# Patient Record
Sex: Male | Born: 1940 | Race: White | Hispanic: No | Marital: Married | Smoking: Never smoker
Health system: Southern US, Community
[De-identification: ages and names within clinical notes are randomized; demographics above are authoritative.]

## PROBLEM LIST (undated history)

## (undated) DIAGNOSIS — I252 Old myocardial infarction: Secondary | ICD-10-CM

## (undated) DIAGNOSIS — I255 Ischemic cardiomyopathy: Secondary | ICD-10-CM

## (undated) DIAGNOSIS — Z95810 Presence of automatic (implantable) cardiac defibrillator: Secondary | ICD-10-CM

## (undated) DIAGNOSIS — K219 Gastro-esophageal reflux disease without esophagitis: Secondary | ICD-10-CM

## (undated) DIAGNOSIS — A048 Other specified bacterial intestinal infections: Secondary | ICD-10-CM

## (undated) DIAGNOSIS — I2510 Atherosclerotic heart disease of native coronary artery without angina pectoris: Secondary | ICD-10-CM

## (undated) DIAGNOSIS — I5023 Acute on chronic systolic (congestive) heart failure: Secondary | ICD-10-CM

## (undated) DIAGNOSIS — I480 Paroxysmal atrial fibrillation: Secondary | ICD-10-CM

## (undated) DIAGNOSIS — I4729 Other ventricular tachycardia: Secondary | ICD-10-CM

## (undated) DIAGNOSIS — I472 Ventricular tachycardia: Secondary | ICD-10-CM

## (undated) HISTORY — DX: Gastro-esophageal reflux disease without esophagitis: K21.9

## (undated) HISTORY — PX: ICD GENERATOR CHANGE: SHX5854

## (undated) HISTORY — DX: Other specified bacterial intestinal infections: A04.8

## (undated) HISTORY — DX: Ventricular tachycardia: I47.2

## (undated) HISTORY — PX: HERNIA REPAIR: SHX51

## (undated) HISTORY — DX: Atherosclerotic heart disease of native coronary artery without angina pectoris: I25.10

## (undated) HISTORY — DX: Paroxysmal atrial fibrillation: I48.0

## (undated) HISTORY — PX: UPPER GI ENDOSCOPY: SHX6162

## (undated) HISTORY — DX: Ischemic cardiomyopathy: I25.5

## (undated) HISTORY — DX: Old myocardial infarction: I25.2

## (undated) HISTORY — DX: Other ventricular tachycardia: I47.29

## (undated) HISTORY — PX: KNEE SURGERY: SHX244

## (undated) HISTORY — DX: Presence of automatic (implantable) cardiac defibrillator: Z95.810

## (undated) HISTORY — PX: CATARACT EXTRACTION, BILATERAL: SHX1313

## (undated) SURGERY — CARDIOVERSION
Anesthesia: General

---

## 1990-01-29 DIAGNOSIS — I252 Old myocardial infarction: Secondary | ICD-10-CM

## 1990-01-29 HISTORY — DX: Old myocardial infarction: I25.2

## 1994-11-05 ENCOUNTER — Encounter: Payer: Self-pay | Admitting: Gastroenterology

## 1996-01-30 HISTORY — PX: CORONARY ARTERY BYPASS GRAFT: SHX141

## 1997-04-30 ENCOUNTER — Encounter (HOSPITAL_COMMUNITY): Admission: RE | Admit: 1997-04-30 | Discharge: 1997-07-29 | Payer: Self-pay | Admitting: Cardiology

## 1997-08-28 ENCOUNTER — Emergency Department (HOSPITAL_COMMUNITY): Admission: EM | Admit: 1997-08-28 | Discharge: 1997-08-28 | Payer: Self-pay | Admitting: Emergency Medicine

## 1998-02-01 ENCOUNTER — Encounter: Payer: Self-pay | Admitting: Emergency Medicine

## 1998-02-01 ENCOUNTER — Emergency Department (HOSPITAL_COMMUNITY): Admission: EM | Admit: 1998-02-01 | Discharge: 1998-02-01 | Payer: Self-pay | Admitting: Emergency Medicine

## 1998-03-24 ENCOUNTER — Encounter (HOSPITAL_COMMUNITY): Admission: RE | Admit: 1998-03-24 | Discharge: 1998-06-22 | Payer: Self-pay | Admitting: Cardiovascular Disease

## 1999-07-05 ENCOUNTER — Encounter: Payer: Self-pay | Admitting: Orthopedic Surgery

## 1999-07-05 ENCOUNTER — Encounter: Admission: RE | Admit: 1999-07-05 | Discharge: 1999-07-05 | Payer: Self-pay | Admitting: Orthopedic Surgery

## 1999-07-06 ENCOUNTER — Encounter: Admission: RE | Admit: 1999-07-06 | Discharge: 1999-07-06 | Payer: Self-pay | Admitting: Orthopedic Surgery

## 1999-07-06 ENCOUNTER — Encounter: Payer: Self-pay | Admitting: Orthopedic Surgery

## 1999-09-04 ENCOUNTER — Encounter: Payer: Self-pay | Admitting: Internal Medicine

## 1999-09-04 ENCOUNTER — Ambulatory Visit (HOSPITAL_COMMUNITY): Admission: RE | Admit: 1999-09-04 | Discharge: 1999-09-05 | Payer: Self-pay | Admitting: Internal Medicine

## 1999-09-05 ENCOUNTER — Encounter: Payer: Self-pay | Admitting: Internal Medicine

## 2000-11-14 ENCOUNTER — Ambulatory Visit (HOSPITAL_COMMUNITY): Admission: RE | Admit: 2000-11-14 | Discharge: 2000-11-14 | Payer: Self-pay | Admitting: Gastroenterology

## 2000-11-14 ENCOUNTER — Encounter: Payer: Self-pay | Admitting: Gastroenterology

## 2002-10-02 ENCOUNTER — Encounter: Admission: RE | Admit: 2002-10-02 | Discharge: 2002-10-02 | Payer: Self-pay | Admitting: Gastroenterology

## 2002-10-02 ENCOUNTER — Encounter: Payer: Self-pay | Admitting: Gastroenterology

## 2004-05-09 ENCOUNTER — Ambulatory Visit: Payer: Self-pay | Admitting: Gastroenterology

## 2004-05-17 ENCOUNTER — Ambulatory Visit: Payer: Self-pay | Admitting: Gastroenterology

## 2004-05-22 ENCOUNTER — Ambulatory Visit: Payer: Self-pay | Admitting: Gastroenterology

## 2004-10-09 ENCOUNTER — Ambulatory Visit: Payer: Self-pay | Admitting: Internal Medicine

## 2004-10-25 ENCOUNTER — Ambulatory Visit: Payer: Self-pay | Admitting: Cardiology

## 2004-11-17 ENCOUNTER — Ambulatory Visit: Payer: Self-pay | Admitting: Cardiovascular Disease

## 2004-12-15 ENCOUNTER — Ambulatory Visit: Payer: Self-pay | Admitting: Cardiology

## 2005-01-12 ENCOUNTER — Ambulatory Visit: Payer: Self-pay | Admitting: Cardiology

## 2005-02-08 ENCOUNTER — Ambulatory Visit: Payer: Self-pay | Admitting: Gastroenterology

## 2005-02-09 ENCOUNTER — Ambulatory Visit: Payer: Self-pay | Admitting: Internal Medicine

## 2005-03-14 ENCOUNTER — Ambulatory Visit: Payer: Self-pay | Admitting: Internal Medicine

## 2005-03-14 ENCOUNTER — Ambulatory Visit: Payer: Self-pay

## 2005-04-13 ENCOUNTER — Ambulatory Visit: Payer: Self-pay | Admitting: Cardiovascular Disease

## 2005-05-14 ENCOUNTER — Ambulatory Visit: Payer: Self-pay | Admitting: Internal Medicine

## 2005-06-11 ENCOUNTER — Ambulatory Visit: Payer: Self-pay | Admitting: Cardiology

## 2005-07-10 ENCOUNTER — Ambulatory Visit: Payer: Self-pay | Admitting: Internal Medicine

## 2005-07-10 ENCOUNTER — Ambulatory Visit: Payer: Self-pay | Admitting: *Deleted

## 2005-07-26 ENCOUNTER — Ambulatory Visit: Payer: Self-pay | Admitting: Internal Medicine

## 2005-08-20 ENCOUNTER — Ambulatory Visit: Payer: Self-pay | Admitting: Cardiology

## 2005-09-14 ENCOUNTER — Ambulatory Visit: Payer: Self-pay | Admitting: Internal Medicine

## 2005-09-27 ENCOUNTER — Ambulatory Visit: Payer: Self-pay | Admitting: Cardiology

## 2005-10-09 ENCOUNTER — Ambulatory Visit: Payer: Self-pay | Admitting: Internal Medicine

## 2005-10-18 ENCOUNTER — Ambulatory Visit: Payer: Self-pay | Admitting: Cardiology

## 2005-11-05 ENCOUNTER — Ambulatory Visit: Payer: Self-pay | Admitting: Cardiovascular Disease

## 2005-11-29 ENCOUNTER — Ambulatory Visit: Payer: Self-pay | Admitting: *Deleted

## 2005-12-13 ENCOUNTER — Ambulatory Visit: Payer: Self-pay | Admitting: Cardiology

## 2006-01-09 ENCOUNTER — Ambulatory Visit: Payer: Self-pay | Admitting: *Deleted

## 2006-01-18 ENCOUNTER — Ambulatory Visit (HOSPITAL_COMMUNITY): Admission: RE | Admit: 2006-01-18 | Discharge: 2006-01-18 | Payer: Self-pay | Admitting: Family Medicine

## 2006-01-24 ENCOUNTER — Ambulatory Visit: Payer: Self-pay | Admitting: Internal Medicine

## 2006-01-24 ENCOUNTER — Ambulatory Visit: Payer: Self-pay | Admitting: Cardiology

## 2006-01-31 ENCOUNTER — Ambulatory Visit: Payer: Self-pay | Admitting: Cardiology

## 2006-02-07 ENCOUNTER — Ambulatory Visit: Payer: Self-pay | Admitting: Cardiology

## 2006-02-21 ENCOUNTER — Ambulatory Visit: Payer: Self-pay | Admitting: Internal Medicine

## 2006-02-28 ENCOUNTER — Ambulatory Visit: Payer: Self-pay | Admitting: Cardiology

## 2006-03-08 ENCOUNTER — Ambulatory Visit: Payer: Self-pay | Admitting: *Deleted

## 2006-03-18 ENCOUNTER — Ambulatory Visit: Payer: Self-pay | Admitting: Internal Medicine

## 2006-03-18 LAB — CONVERTED CEMR LAB: INR: 6.2 (ref 0.9–2.0)

## 2006-04-03 ENCOUNTER — Ambulatory Visit: Payer: Self-pay | Admitting: *Deleted

## 2006-04-11 ENCOUNTER — Ambulatory Visit: Payer: Self-pay | Admitting: Cardiology

## 2006-04-24 ENCOUNTER — Ambulatory Visit: Payer: Self-pay | Admitting: Cardiovascular Disease

## 2006-05-15 ENCOUNTER — Ambulatory Visit: Payer: Self-pay | Admitting: Internal Medicine

## 2006-05-15 LAB — CONVERTED CEMR LAB: Prothrombin Time: 28.2 s — ABNORMAL HIGH (ref 10.0–14.0)

## 2006-05-23 ENCOUNTER — Ambulatory Visit: Payer: Self-pay | Admitting: Internal Medicine

## 2006-05-27 ENCOUNTER — Ambulatory Visit: Payer: Self-pay | Admitting: Cardiology

## 2006-07-02 ENCOUNTER — Ambulatory Visit: Payer: Self-pay | Admitting: Cardiology

## 2006-07-17 ENCOUNTER — Ambulatory Visit: Payer: Self-pay | Admitting: Cardiology

## 2006-08-07 ENCOUNTER — Ambulatory Visit: Payer: Self-pay | Admitting: Cardiology

## 2006-08-16 ENCOUNTER — Ambulatory Visit: Payer: Self-pay | Admitting: Gastroenterology

## 2006-08-23 ENCOUNTER — Ambulatory Visit: Payer: Self-pay | Admitting: Cardiology

## 2006-08-23 LAB — CONVERTED CEMR LAB
INR: 8.2 (ref 0.9–2.0)
Prothrombin Time: 37.7 s (ref 10.0–14.0)

## 2006-08-26 ENCOUNTER — Ambulatory Visit: Payer: Self-pay | Admitting: Internal Medicine

## 2006-08-26 ENCOUNTER — Ambulatory Visit: Payer: Self-pay | Admitting: Gastroenterology

## 2006-08-26 LAB — CONVERTED CEMR LAB: OCCULT 3: NEGATIVE

## 2006-09-02 ENCOUNTER — Ambulatory Visit: Payer: Self-pay | Admitting: Internal Medicine

## 2006-09-12 ENCOUNTER — Ambulatory Visit: Payer: Self-pay | Admitting: Cardiovascular Disease

## 2006-10-03 ENCOUNTER — Ambulatory Visit: Payer: Self-pay | Admitting: Cardiology

## 2006-10-16 ENCOUNTER — Ambulatory Visit: Payer: Self-pay | Admitting: Internal Medicine

## 2006-10-24 ENCOUNTER — Ambulatory Visit: Payer: Self-pay | Admitting: Internal Medicine

## 2006-10-30 ENCOUNTER — Ambulatory Visit: Payer: Self-pay | Admitting: Cardiology

## 2006-11-13 ENCOUNTER — Ambulatory Visit: Payer: Self-pay | Admitting: Cardiovascular Disease

## 2006-12-11 ENCOUNTER — Ambulatory Visit: Payer: Self-pay | Admitting: Cardiology

## 2007-01-09 ENCOUNTER — Ambulatory Visit: Payer: Self-pay | Admitting: Cardiology

## 2007-01-21 ENCOUNTER — Ambulatory Visit: Payer: Self-pay | Admitting: Cardiology

## 2007-01-29 ENCOUNTER — Ambulatory Visit: Payer: Self-pay | Admitting: Cardiology

## 2007-02-05 ENCOUNTER — Ambulatory Visit: Payer: Self-pay | Admitting: Internal Medicine

## 2007-04-19 ENCOUNTER — Emergency Department (HOSPITAL_COMMUNITY): Admission: EM | Admit: 2007-04-19 | Discharge: 2007-04-19 | Payer: Self-pay | Admitting: Emergency Medicine

## 2007-04-27 ENCOUNTER — Inpatient Hospital Stay (HOSPITAL_COMMUNITY): Admission: EM | Admit: 2007-04-27 | Discharge: 2007-05-05 | Payer: Self-pay | Admitting: Emergency Medicine

## 2007-04-27 ENCOUNTER — Ambulatory Visit: Payer: Self-pay | Admitting: Internal Medicine

## 2007-05-08 ENCOUNTER — Ambulatory Visit: Payer: Self-pay | Admitting: Internal Medicine

## 2007-05-13 DIAGNOSIS — I255 Ischemic cardiomyopathy: Secondary | ICD-10-CM | POA: Insufficient documentation

## 2007-05-13 DIAGNOSIS — I480 Paroxysmal atrial fibrillation: Secondary | ICD-10-CM

## 2007-05-13 DIAGNOSIS — R1013 Epigastric pain: Secondary | ICD-10-CM | POA: Insufficient documentation

## 2007-06-10 ENCOUNTER — Telehealth: Payer: Self-pay | Admitting: Gastroenterology

## 2007-08-07 ENCOUNTER — Ambulatory Visit: Payer: Self-pay | Admitting: Internal Medicine

## 2007-08-19 ENCOUNTER — Telehealth: Payer: Self-pay | Admitting: Gastroenterology

## 2007-10-09 DIAGNOSIS — K589 Irritable bowel syndrome without diarrhea: Secondary | ICD-10-CM

## 2007-10-09 DIAGNOSIS — E785 Hyperlipidemia, unspecified: Secondary | ICD-10-CM

## 2007-10-09 DIAGNOSIS — N200 Calculus of kidney: Secondary | ICD-10-CM | POA: Insufficient documentation

## 2007-10-09 DIAGNOSIS — J45909 Unspecified asthma, uncomplicated: Secondary | ICD-10-CM | POA: Insufficient documentation

## 2007-10-10 ENCOUNTER — Ambulatory Visit: Payer: Self-pay | Admitting: Gastroenterology

## 2007-10-10 DIAGNOSIS — R1032 Left lower quadrant pain: Secondary | ICD-10-CM | POA: Insufficient documentation

## 2007-10-10 DIAGNOSIS — K5732 Diverticulitis of large intestine without perforation or abscess without bleeding: Secondary | ICD-10-CM | POA: Insufficient documentation

## 2007-10-13 LAB — CONVERTED CEMR LAB
Basophils Absolute: 0 10*3/uL (ref 0.0–0.1)
Eosinophils Absolute: 0.5 10*3/uL (ref 0.0–0.7)
Lymphocytes Relative: 14.2 % (ref 12.0–46.0)
MCV: 88 fL (ref 78.0–100.0)
Monocytes Absolute: 1.2 10*3/uL — ABNORMAL HIGH (ref 0.1–1.0)
Neutrophils Relative %: 69.7 % (ref 43.0–77.0)
RBC: 4.73 M/uL (ref 4.22–5.81)
RDW: 13.3 % (ref 11.5–14.6)
Sed Rate: 27 mm/hr — ABNORMAL HIGH (ref 0–16)

## 2007-10-20 ENCOUNTER — Telehealth: Payer: Self-pay | Admitting: Gastroenterology

## 2007-10-23 ENCOUNTER — Ambulatory Visit: Payer: Self-pay | Admitting: Gastroenterology

## 2007-10-23 LAB — CONVERTED CEMR LAB
OCCULT 1: NEGATIVE
OCCULT 5: NEGATIVE

## 2007-11-06 ENCOUNTER — Ambulatory Visit: Payer: Self-pay | Admitting: Internal Medicine

## 2007-12-17 ENCOUNTER — Ambulatory Visit: Payer: Self-pay | Admitting: Internal Medicine

## 2007-12-17 LAB — CONVERTED CEMR LAB
BUN: 15 mg/dL (ref 6–23)
CO2: 30 meq/L (ref 19–32)
Calcium: 8.8 mg/dL (ref 8.4–10.5)
Chloride: 104 meq/L (ref 96–112)
Creatinine, Ser: 0.9 mg/dL (ref 0.4–1.5)
GFR calc Af Amer: 108 mL/min
GFR calc non Af Amer: 89 mL/min
Glucose, Bld: 83 mg/dL (ref 70–99)

## 2008-01-14 ENCOUNTER — Telehealth: Payer: Self-pay | Admitting: Gastroenterology

## 2008-01-16 ENCOUNTER — Ambulatory Visit: Payer: Self-pay | Admitting: Internal Medicine

## 2008-02-03 ENCOUNTER — Ambulatory Visit: Payer: Self-pay | Admitting: Internal Medicine

## 2008-02-03 LAB — CONVERTED CEMR LAB
Basophils Absolute: 0 10*3/uL (ref 0.0–0.1)
CO2: 29 meq/L (ref 19–32)
Creatinine, Ser: 0.9 mg/dL (ref 0.4–1.5)
Eosinophils Absolute: 0.2 10*3/uL (ref 0.0–0.7)
GFR calc Af Amer: 108 mL/min
GFR calc non Af Amer: 89 mL/min
Glucose, Bld: 75 mg/dL (ref 70–99)
HCT: 44.5 % (ref 39.0–52.0)
Monocytes Absolute: 1.4 10*3/uL — ABNORMAL HIGH (ref 0.1–1.0)
Monocytes Relative: 12.2 % — ABNORMAL HIGH (ref 3.0–12.0)
Neutrophils Relative %: 69.9 % (ref 43.0–77.0)
Platelets: 234 10*3/uL (ref 150–400)
Potassium: 4.3 meq/L (ref 3.5–5.1)
WBC: 11.6 10*3/uL — ABNORMAL HIGH (ref 4.5–10.5)

## 2008-02-09 ENCOUNTER — Ambulatory Visit: Payer: Self-pay | Admitting: Internal Medicine

## 2008-02-09 ENCOUNTER — Ambulatory Visit (HOSPITAL_COMMUNITY): Admission: AD | Admit: 2008-02-09 | Discharge: 2008-02-09 | Payer: Self-pay | Admitting: Internal Medicine

## 2008-02-23 ENCOUNTER — Ambulatory Visit: Payer: Self-pay

## 2008-02-23 ENCOUNTER — Encounter: Payer: Self-pay | Admitting: Internal Medicine

## 2008-03-02 ENCOUNTER — Ambulatory Visit: Payer: Self-pay | Admitting: Gastroenterology

## 2008-03-02 DIAGNOSIS — K219 Gastro-esophageal reflux disease without esophagitis: Secondary | ICD-10-CM | POA: Insufficient documentation

## 2008-03-03 ENCOUNTER — Telehealth: Payer: Self-pay | Admitting: Gastroenterology

## 2008-03-05 ENCOUNTER — Ambulatory Visit: Payer: Self-pay | Admitting: Gastroenterology

## 2008-03-17 ENCOUNTER — Telehealth: Payer: Self-pay | Admitting: Gastroenterology

## 2008-05-04 ENCOUNTER — Encounter: Payer: Self-pay | Admitting: Internal Medicine

## 2008-05-04 ENCOUNTER — Ambulatory Visit: Payer: Self-pay | Admitting: Internal Medicine

## 2008-05-04 DIAGNOSIS — Z8679 Personal history of other diseases of the circulatory system: Secondary | ICD-10-CM

## 2008-06-30 ENCOUNTER — Telehealth: Payer: Self-pay | Admitting: Gastroenterology

## 2008-08-03 ENCOUNTER — Ambulatory Visit: Payer: Self-pay | Admitting: Internal Medicine

## 2008-08-04 ENCOUNTER — Encounter: Payer: Self-pay | Admitting: Internal Medicine

## 2008-08-16 ENCOUNTER — Encounter: Payer: Self-pay | Admitting: Internal Medicine

## 2008-08-19 ENCOUNTER — Ambulatory Visit: Payer: Self-pay | Admitting: Nurse Practitioner

## 2008-08-19 ENCOUNTER — Telehealth: Payer: Self-pay | Admitting: Gastroenterology

## 2008-08-19 ENCOUNTER — Ambulatory Visit: Payer: Self-pay | Admitting: Internal Medicine

## 2008-08-19 LAB — CONVERTED CEMR LAB
Basophils Relative: 1.4 % (ref 0.0–3.0)
HCT: 43.9 % (ref 39.0–52.0)
Hemoglobin: 14.7 g/dL (ref 13.0–17.0)
Lymphocytes Relative: 27.2 % (ref 12.0–46.0)
Lymphs Abs: 3 10*3/uL (ref 0.7–4.0)
MCHC: 33.6 g/dL (ref 30.0–36.0)
MCV: 88.3 fL (ref 78.0–100.0)
Platelets: 257 10*3/uL (ref 150.0–400.0)
RDW: 12.8 % (ref 11.5–14.6)

## 2008-08-31 ENCOUNTER — Ambulatory Visit: Payer: Self-pay | Admitting: Gastroenterology

## 2008-08-31 LAB — CONVERTED CEMR LAB: OCCULT 2: NEGATIVE

## 2008-09-14 ENCOUNTER — Ambulatory Visit: Payer: Self-pay | Admitting: Gastroenterology

## 2008-10-22 ENCOUNTER — Ambulatory Visit: Payer: Self-pay | Admitting: Gastroenterology

## 2008-10-22 ENCOUNTER — Telehealth: Payer: Self-pay | Admitting: Gastroenterology

## 2008-11-09 ENCOUNTER — Ambulatory Visit: Payer: Self-pay | Admitting: Internal Medicine

## 2008-11-18 ENCOUNTER — Encounter: Payer: Self-pay | Admitting: Internal Medicine

## 2008-12-20 ENCOUNTER — Telehealth: Payer: Self-pay | Admitting: Gastroenterology

## 2009-01-29 DIAGNOSIS — I255 Ischemic cardiomyopathy: Secondary | ICD-10-CM

## 2009-01-29 HISTORY — DX: Ischemic cardiomyopathy: I25.5

## 2009-02-01 ENCOUNTER — Ambulatory Visit: Payer: Self-pay | Admitting: Internal Medicine

## 2009-02-01 DIAGNOSIS — I5022 Chronic systolic (congestive) heart failure: Secondary | ICD-10-CM

## 2009-03-08 ENCOUNTER — Telehealth: Payer: Self-pay | Admitting: Gastroenterology

## 2009-05-03 ENCOUNTER — Ambulatory Visit: Payer: Self-pay | Admitting: Internal Medicine

## 2009-05-18 ENCOUNTER — Encounter: Payer: Self-pay | Admitting: Internal Medicine

## 2009-05-27 ENCOUNTER — Encounter: Payer: Self-pay | Admitting: Internal Medicine

## 2009-06-21 ENCOUNTER — Emergency Department (HOSPITAL_COMMUNITY): Admission: EM | Admit: 2009-06-21 | Discharge: 2009-06-22 | Payer: Self-pay | Admitting: Emergency Medicine

## 2009-06-22 ENCOUNTER — Telehealth: Payer: Self-pay | Admitting: Internal Medicine

## 2009-06-28 ENCOUNTER — Ambulatory Visit: Payer: Self-pay | Admitting: Cardiology

## 2009-06-28 ENCOUNTER — Encounter: Payer: Self-pay | Admitting: Physician Assistant

## 2009-07-22 ENCOUNTER — Telehealth: Payer: Self-pay | Admitting: Gastroenterology

## 2009-08-23 ENCOUNTER — Ambulatory Visit: Payer: Self-pay | Admitting: Internal Medicine

## 2009-09-09 ENCOUNTER — Encounter (INDEPENDENT_AMBULATORY_CARE_PROVIDER_SITE_OTHER): Payer: Self-pay | Admitting: *Deleted

## 2009-09-13 ENCOUNTER — Telehealth: Payer: Self-pay | Admitting: Internal Medicine

## 2009-09-16 ENCOUNTER — Ambulatory Visit: Payer: Self-pay | Admitting: Gastroenterology

## 2009-09-16 ENCOUNTER — Encounter (INDEPENDENT_AMBULATORY_CARE_PROVIDER_SITE_OTHER): Payer: Self-pay | Admitting: *Deleted

## 2009-09-20 ENCOUNTER — Ambulatory Visit: Payer: Self-pay | Admitting: Gastroenterology

## 2009-09-20 LAB — CONVERTED CEMR LAB: Fecal Occult Bld: NEGATIVE

## 2009-09-26 ENCOUNTER — Telehealth: Payer: Self-pay | Admitting: Gastroenterology

## 2009-11-24 ENCOUNTER — Telehealth (INDEPENDENT_AMBULATORY_CARE_PROVIDER_SITE_OTHER): Payer: Self-pay | Admitting: *Deleted

## 2009-11-24 ENCOUNTER — Ambulatory Visit: Payer: Self-pay | Admitting: Internal Medicine

## 2009-12-06 ENCOUNTER — Encounter: Admission: RE | Admit: 2009-12-06 | Discharge: 2009-12-06 | Payer: Self-pay | Admitting: Otolaryngology

## 2009-12-06 ENCOUNTER — Encounter: Admission: RE | Admit: 2009-12-06 | Discharge: 2009-12-06 | Payer: Self-pay | Admitting: Pulmonary Disease

## 2009-12-14 ENCOUNTER — Encounter: Payer: Self-pay | Admitting: Internal Medicine

## 2010-01-11 ENCOUNTER — Telehealth: Payer: Self-pay | Admitting: Gastroenterology

## 2010-01-25 ENCOUNTER — Encounter: Payer: Self-pay | Admitting: Internal Medicine

## 2010-03-02 ENCOUNTER — Ambulatory Visit: Admit: 2010-03-02 | Payer: Self-pay | Admitting: Internal Medicine

## 2010-03-02 ENCOUNTER — Encounter: Payer: Self-pay | Admitting: Internal Medicine

## 2010-03-02 ENCOUNTER — Encounter (INDEPENDENT_AMBULATORY_CARE_PROVIDER_SITE_OTHER): Payer: Medicare Other

## 2010-03-02 DIAGNOSIS — I428 Other cardiomyopathies: Secondary | ICD-10-CM

## 2010-03-02 DIAGNOSIS — I509 Heart failure, unspecified: Secondary | ICD-10-CM

## 2010-03-02 NOTE — Progress Notes (Signed)
Summary: Samples   Phone Note Call from Patient Call back at [phone]   Caller: Patient Call For: Dr. [name] Reason for Call: Talk to Nurse Summary of Call: Asking for samples of Nexium Initial call taken by: Karna Christmas,  January 11, 2010 9:50 AM  Follow-up for Phone Call        three boxes left at the front desk and pt states that Dr. Jerilee Field gives him him rx for Nexium. Follow-up by: Harlow Mares CMA (AAMA),  January 11, 2010 11:09 AM    New/Updated Medications: NEXIUM 40 MG  CPDR (ESOMEPRAZOLE MAGNESIUM) 1 capsule each day 30 minutes before meal

## 2010-03-02 NOTE — Cardiovascular Report (Signed)
Summary: Office Visit   Office Visit   Imported By: Roderic Ovens 08/25/2009 15:04:38  _____________________________________________________________________  External Attachment:    Type:   Image     Comment:   External Document

## 2010-03-02 NOTE — Progress Notes (Signed)
Summary: [facility]  [facility]   Imported By: Debby Freiberg 07/22/2009 12:40:52  _____________________________________________________________________  External Attachment:    Type:   Image     Comment:   External Document

## 2010-03-02 NOTE — Progress Notes (Signed)
Summary: Samples of Nexium  Medications Added NEXIUM 40 MG  CPDR (ESOMEPRAZOLE MAGNESIUM) 1 capsule each day 30 minutes before meal       Phone Note Call from Patient Call back at [phone]   Call For: Dr [name] Reason for Call: Talk to Nurse Summary of Call: Wonders if there are any samples of Nexium he can have? Initial call taken by: Leanor Kail [facility],  July 22, 2009 12:13 PM  Follow-up for Phone Call        Samples given. Follow-up by: Ashok Cordia RN,  July 22, 2009 3:03 PM    New/Updated Medications: NEXIUM 40 MG  CPDR (ESOMEPRAZOLE MAGNESIUM) 1 capsule each day 30 minutes before meal

## 2010-03-02 NOTE — Progress Notes (Signed)
Summary: samples  Medications Added NEXIUM 40 MG  CPDR (ESOMEPRAZOLE MAGNESIUM) 1 capsule each day 30 minutes before meal       Phone Note Call from Patient Call back at [phone]   Caller: Patient Call For: [name] Reason for Call: Talk to Nurse Summary of Call: Patient would like some Nexium samples to see if they work before he gets a prescription for them. Initial call taken by: Tawni Levy,  March 08, 2009 10:24 AM  Follow-up for Phone Call        Samples given to pt.  Follow-up by: Ashok Cordia RN,  March 08, 2009 1:36 PM    New/Updated Medications: NEXIUM 40 MG  CPDR (ESOMEPRAZOLE MAGNESIUM) 1 capsule each day 30 minutes before meal

## 2010-03-02 NOTE — Letter (Signed)
Summary: [facility]: Immunoassay Fecal Occult Blood (iFOB) Order [facility]  [address] [place], [place] [address]   Phone: [phone]  Fax: [phone]      [facility]: Immunoassay Fecal Occult Blood (iFOB) Order Form   September 16, 2009 MRN: [id number]   [name] 15-Feb-1940   Physicican Name: [name]  Diagnosis Code: 530.81, 564.1     Ashok Cordia RN

## 2010-03-02 NOTE — Progress Notes (Signed)
Summary: returning call   Phone Note Call from Patient   Caller:  [phone] patient Reason for Call: Talk to Nurse Summary of Call: pt returning call to paula Initial call taken by: Glynda Jaeger,  November 24, 2009 8:38 AM  Follow-up for Phone Call        Patient instructed to send his remote transmission today Follow-up by: Altha Harm, LPN,  November 24, 2009 4:29 PM

## 2010-03-02 NOTE — Assessment & Plan Note (Signed)
Summary: F/U APPT...LSW.    History of Present Illness Visit Type: Follow-up Visit Primary GI MD: Sheryn Bison MD FACP FAGA Primary Provider: [name], MD Requesting Provider: na Chief Complaint: F/u for IBS. Pt states he is doing better just c/o GERD History of Present Illness:   [name] has chronic GERD and associated asthmatic bronchitis. Use of Symbicort inhalers in the past have exacerbated his atrial fibrillation. He currently is in normal rhythm I does have an implanted defibrillator. He is on multiple cardiac medications for coronary artery disease, and is chronically anticoagulated on Coumadin.  I followed him for many years because of acid reflux which is well controlled on daily Nexium. He continues with some extra esophageal manifestations of GERD with throat clearing, hoarseness, and occasional asthma exacerbations. He denies current dysphasia, hepatobiliary or lower gastrointestinal problems. His appetite is good his weight is stable.   GI Review of Systems    Reports acid reflux and  heartburn.      Denies abdominal pain, belching, bloating, chest pain, dysphagia with liquids, dysphagia with solids, loss of appetite, nausea, vomiting, vomiting blood, weight loss, and  weight gain.      Reports irritable bowel syndrome.     Denies anal fissure, black tarry stools, change in bowel habit, constipation, diarrhea, diverticulosis, fecal incontinence, heme positive stool, hemorrhoids, jaundice, light color stool, liver problems, rectal bleeding, and  rectal pain.    Current Medications (verified): 1)  Coreg 25 Mg Tabs (Carvedilol) .... 2 Tablets By Mouth Once Daily 2)  Lanoxin 0.125 Mg Tabs (Digoxin) .... One By Mouth Once Daily 3)  Prinivil 20 Mg Tabs (Lisinopril) .Marland Kitchen.. 1 1/2 Tablets  By Mouth Once Daily 4)  Coumadin 5 Mg Tabs (Warfarin Sodium) .... Take Half By Mouth Once Daily 5)  Simvastatin 40 Mg Tabs (Simvastatin) .... One By Mouth At Bedtime 6)  Adult Aspirin Ec  Low Strength 81 Mg Tbec (Aspirin) .... One By Mouth Once Daily 7)  Pantoprazole Sodium 40 Mg Tbec (Pantoprazole Sodium) .... One By Mouth Once Daily( Will Use When Out of Nexium) 8)  Isosorbide Mononitrate Cr 30 Mg Xr24h-Tab (Isosorbide Mononitrate) .... Take 1/2 Tablet Once Daily 9)  Spironolactone 25 Mg Tabs (Spironolactone) .... Take 1/2 Tablet By Mouth Once Daily 10)  Fish Oil 1200 Mg Caps (Omega-3 Fatty Acids) .... One By Mouth Two Times A Day 11)  Allegra 180 Mg Tabs (Fexofenadine Hcl) .... One By Mouth Once Daily 12)  Nexium 40 Mg  Cpdr (Esomeprazole Magnesium) .Marland Kitchen.. 1 Capsule Each Day 30 Minutes Before Meal 13)  Finasteride 5 Mg Tabs (Finasteride) .... Take One Daily 14)  Allopurinol 300 Mg Tabs (Allopurinol) .... Take 1/2 Daily 15)  Alvesco 80 Mcg/act Aers (Ciclesonide) .... Twice A Day  Allergies (verified): 1)  Amoxicillin (Amoxicillin) 2)  Penicillin V Potassium (Penicillin V Potassium)  Past History:  Past medical, surgical, family and social histories (including risk factors) reviewed for relevance to current acute and chronic problems.  Past Medical History: Reviewed history from 01/28/2009 and no changes required. Current Problems:  ASTHMA (ICD-493.90) NEPHROLITHIASIS (ICD-592.0) COUMADIN THERAPY (ICD-V58.61) IRRITABLE BOWEL SYNDROME (ICD-564.1) HYPERLIPIDEMIA (ICD-272.4) CARDIOMYOPATHY, ISCHEMIC (ICD-414.8) NEPHROLITHIASIS, HX OF (ICD-V13.01) GERD FIBRILLATION, ATRIAL (ICD-427.31) AICD-Medtronic Virtuoso U045  Past Surgical History: Reviewed history from 03/02/2008 and no changes required. implantable cardiodefibrillator 2000, reimplanted new defibrillator 2010 CABG Right knee surgery hernia repair  Family History: Reviewed history from 03/02/2008 and no changes required. Family History of Diabetes: Mother,Brother No FH of Colon Cancer:  Social History: Reviewed  history from 03/02/2008 and no changes required. Married Patient has never smoked.  Alcohol  Use - yes-socially Illicit Drug Use - no  Review of Systems       The patient complains of allergy/sinus, arthritis/joint pain, back pain, cough, and muscle pains/cramps.  The patient denies anemia, anxiety-new, blood in urine, breast changes/lumps, change in vision, confusion, coughing up blood, depression-new, fainting, fatigue, fever, headaches-new, hearing problems, heart murmur, heart rhythm changes, itching, menstrual pain, night sweats, nosebleeds, pregnancy symptoms, shortness of breath, skin rash, sleeping problems, sore throat, swelling of feet/legs, swollen lymph glands, thirst - excessive , urination - excessive , urination changes/pain, urine leakage, vision changes, and voice change.    Vital Signs:  Patient profile:   70 year old male Height:      67 inches Weight:      155 pounds BMI:     24.36 BSA:     1.82 Pulse rate:   60 / minute Pulse rhythm:   regular BP sitting:   104 / 60  (left arm) Cuff size:   regular  Vitals Entered By: Ok Anis CMA (September 16, 2009 11:17 AM)  Physical Exam  General:  Well developed, well nourished, no acute distress.healthy appearing.   Head:  Normocephalic and atraumatic. Eyes:  PERRLA, no icterus.exam deferred to patient's ophthalmologist.   Mouth:  No deformity or lesions, dentition normal. Neck:  Supple; no masses or thyromegaly. Lungs:  Clear throughout to auscultation. Heart:  Regular rate and rhythm; no murmurs, rubs,  or bruits. Abdomen:  Soft, nontender and nondistended. No masses, hepatosplenomegaly or hernias noted. Normal bowel sounds. Extremities:  No clubbing, cyanosis, edema or deformities noted. Neurologic:  Alert and  oriented x4;  grossly normal neurologically. Cervical Nodes:  No significant cervical adenopathy. Psych:  Alert and cooperative. Normal mood and affect.   Impression & Recommendations:  Problem # 1:  GERD (ICD-530.81) Assessment Improved Continue daily Nexium and he can use this twice a day for  4-5 days if needed for exacerbation of his GERD. He does not need followup endoscopy at this time. We have asked him to return stool cards for Hemoccult checks.  Problem # 2:  CARDIOMYOPATHY, ISCHEMIC (ICD-414.8) Assessment: Improved continue multiple medications per Dr. [name].  Patient Instructions: 1)  Please go to the basement for lab instructions. 2)  Please continue current medications.  3)  The medication list was reviewed and reconciled.  All changed / newly prescribed medications were explained.  A complete medication list was provided to the patient / caregiver. 4)  Copy sent to : Dr. [name]. 5)  Please continue current medications.  6)  Avoid foods high in acid content ( tomatoes, citrus juices, spicy foods) . Avoid eating within 3 to 4 hours of lying down or before exercising. Do not over eat; try smaller more frequent meals. Elevate head of bed four inches when sleeping.

## 2010-03-02 NOTE — Progress Notes (Signed)
Summary: Calling about device check   Phone Note Call from Patient Call back at Home Phone [phone] Call back at [phone]   Caller: Patient Summary of Call: Pt calling  regarding trouble shooting device check Initial call taken by: Judie Grieve,  September 13, 2009 4:40 PM  Additional Follow-up for Phone Call Additional follow up Details #1::        lmovm for pt--pt not due for carelink check until 11-24-09.  Vella Kohler  September 14, 2009 8:27 AM

## 2010-03-02 NOTE — Assessment & Plan Note (Signed)
Summary: pc2      Allergies Added:   Primary Provider:  [name], MD  CC:  pc2.  History of Present Illness: [name] is seen in followup for ICD implanted for primary prevention in the setting of ischemic heart disease. He has had no intercurrent discharges.  there has been no shortness of breath. There has been no peripheral edema. and no chest pain. His head complaints of rapid tachycardia palpitations. He treated this to the use of Symbicort for his COPD. He was seen in the emergency room in May with rapid AF. He converted to sinus rhythm. He knows that his heart rate at that time was about 100. He is currently not on antiarrhythmic drug therapy. his inhaler was changed to Alvesco and there has been a marked improvement in the frequency of his spells.     Current Medications (verified): 1)  Coreg 25 Mg Tabs (Carvedilol) .... 2 Tablets By Mouth Once Daily 2)  Lanoxin 0.125 Mg Tabs (Digoxin) .... One By Mouth Once Daily 3)  Prinivil 20 Mg Tabs (Lisinopril) .Marland Kitchen.. 1 1/2 Tablets  By Mouth Once Daily 4)  Coumadin 5 Mg Tabs (Warfarin Sodium) .... Take Half By Mouth Once Daily 5)  Simvastatin 40 Mg Tabs (Simvastatin) .... One By Mouth At Bedtime 6)  Adult Aspirin Ec Low Strength 81 Mg Tbec (Aspirin) .... One By Mouth Once Daily 7)  Pantoprazole Sodium 40 Mg Tbec (Pantoprazole Sodium) .... One By Mouth Once Daily 8)  Isosorbide Mononitrate Cr 30 Mg Xr24h-Tab (Isosorbide Mononitrate) .... Take 1/2 Tablet Once Daily 9)  Spironolactone 25 Mg Tabs (Spironolactone) .... Take 1/2 Tablet By Mouth Once Daily 10)  Fish Oil 1200 Mg Caps (Omega-3 Fatty Acids) .... One By Mouth Two Times A Day 11)  Allegra 180 Mg Tabs (Fexofenadine Hcl) .... One By Mouth Once Daily 12)  Nexium 40 Mg  Cpdr (Esomeprazole Magnesium) .Marland Kitchen.. 1 Capsule Each Day 30 Minutes Before Meal 13)  Finasteride 5 Mg Tabs (Finasteride) .... Take One Daily 14)  Allopurinol 300 Mg Tabs (Allopurinol) .... Take 1/2 Daily 15)  Alvesco  80 Mcg/act Aers (Ciclesonide) .... Twice A Day  Allergies (verified): 1)  Amoxicillin (Amoxicillin) 2)  Penicillin V Potassium (Penicillin V Potassium)  Past History:  Past Medical History: Last updated: 01/28/2009 Current Problems:  ASTHMA (ICD-493.90) NEPHROLITHIASIS (ICD-592.0) COUMADIN THERAPY (ICD-V58.61) IRRITABLE BOWEL SYNDROME (ICD-564.1) HYPERLIPIDEMIA (ICD-272.4) CARDIOMYOPATHY, ISCHEMIC (ICD-414.8) NEPHROLITHIASIS, HX OF (ICD-V13.01) GERD FIBRILLATION, ATRIAL (ICD-427.31) AICD-Medtronic Virtuoso Z610  Past Surgical History: Last updated: 03/02/2008 implantable cardiodefibrillator 2000, reimplanted new defibrillator 2010 CABG Right knee surgery hernia repair  Family History: Last updated: 03/02/2008 Family History of Diabetes: Mother,Brother No FH of Colon Cancer:  Social History: Last updated: 03/02/2008 Married Patient has never smoked.  Alcohol Use - yes-socially Illicit Drug Use - no  Vital Signs:  Patient profile:   70 year old male Height:      67 inches Weight:      157 pounds BMI:     24.68 Pulse rate:   59 / minute Pulse rhythm:   regular BP sitting:   110 / 68  (left arm) Cuff size:   regular  Vitals Entered By: Judithe Modest CMA (August 23, 2009 9:12 AM)  Physical Exam  General:  in no acute distress Head:  normal HEENT Neck:  flat neck veins and supple Lungs:  clear Heart:  regular rate and rhythm without murmurs or gallop Abdomen:  soft and nontender without hepatomegaly Pulses:  intact distal pulse Extremities:  no clubbing cyanosis or edema Neurologic:  alert and oriented otherwise grossly Skin:  warm and dry Psych:  engaging affect    ICD Specifications Following MD:  Sherryl Manges, MD     ICD Vendor:  Medtronic     ICD Model Number:  [id number]     ICD Serial Number:  [id number] ICD DOI:  02/09/2008     ICD Implanting MD:  Sherryl Manges, MD  Lead 1:    Location: RV     DOI: 09/04/1999     Model #: [id number]     Serial #: [id number]      Status: active  Indications::  VT  Explantation Comments: 02-09-08 GDT 1860 EXPLANTED  ICD Follow Up Remote Check?  No Battery Voltage:  3.16 V     Charge Time:  9.0 seconds     Underlying rhythm:  Huston Foley ICD Dependent:  No       ICD Device Measurements Right Ventricle:  Amplitude: 18.6 mV, Impedance: 1088 ohms, Threshold: 1.0 V at 0.4 msec Shock Impedance: 52/70 ohms   Episodes Coumadin:  Yes Shock:  0     ATP:  0     Nonsustained:  2     Ventricular Pacing:  <0.1%  Brady Parameters Mode VVI     Lower Rate Limit:  40      Tachy Zones VF:  200     VT:  250 FVT VIA VF     VT1:  176     Next Remote Date:  11/24/2009     Next Cardiology Appt Due:  07/30/2010 Tech Comments:  Onset programmed on.  Optivol and thoracic impedance abnormal since 7/13.  [name] has some minimal ankle edema today, denies any SOB other than allergy probloems.  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  Carelink transmissions every 3 months.  ROV 1 year with Dr. Graciela Husbands. Altha Harm, LPN  August 23, 2009 9:22 AM   Impression & Recommendations:  Problem # 1:  FIBRILLATION, ATRIAL (ICD-427.31) the patient has had recurrent atrial fibrillation with a moderately rapid ventricular response. This has been improved since he discontinued his prior inhaler. In the event that this remains symptomatic, I would first try to increase his beta blocker by adding a low dose of metoprolol succinate to his carvedilol. If this is inadequate we will consider antiarrhythmic drug therapy His updated medication list for this problem includes:    Coreg 25 Mg Tabs (Carvedilol) .Marland Kitchen... 2 tablets by mouth once daily    Lanoxin 0.125 Mg Tabs (Digoxin) ..... One by mouth once daily    Coumadin 5 Mg Tabs (Warfarin sodium) .Marland Kitchen... Take half by mouth once daily    Adult Aspirin Ec Low Strength 81 Mg Tbec (Aspirin) ..... One by mouth once daily  Problem # 2:  CARDIOMYOPATHY, ISCHEMIC (ICD-414.8) stable on his current  medications His updated medication list for this problem includes:    Coreg 25 Mg Tabs (Carvedilol) .Marland Kitchen... 2 tablets by mouth once daily    Lanoxin 0.125 Mg Tabs (Digoxin) ..... One by mouth once daily    Prinivil 20 Mg Tabs (Lisinopril) .Marland Kitchen... 1 1/2 tablets  by mouth once daily    Coumadin 5 Mg Tabs (Warfarin sodium) .Marland Kitchen... Take half by mouth once daily    Adult Aspirin Ec Low Strength 81 Mg Tbec (Aspirin) ..... One by mouth once daily    Isosorbide Mononitrate Cr 30 Mg Xr24h-tab (Isosorbide mononitrate) .Marland Kitchen... Take 1/2 tablet once daily  Spironolactone 25 Mg Tabs (Spironolactone) .Marland Kitchen... 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 The second derivative however is positive suggesting a return to normal. We will plan to recheck his optivol via [facility]Link in 2 weeks. His updated medication list for this problem includes:    Coreg 25 Mg Tabs (Carvedilol) .Marland Kitchen... 2 tablets by mouth once daily    Lanoxin 0.125 Mg Tabs (Digoxin) ..... One by mouth once daily    Prinivil 20 Mg Tabs (Lisinopril) .Marland Kitchen... 1 1/2 tablets  by mouth once daily    Coumadin 5 Mg Tabs (Warfarin sodium) .Marland Kitchen... Take half by mouth once daily    Adult Aspirin Ec Low Strength 81 Mg Tbec (Aspirin) ..... One by mouth once daily    Isosorbide Mononitrate Cr 30 Mg Xr24h-tab (Isosorbide mononitrate) .Marland Kitchen... Take 1/2 tablet once daily    Spironolactone 25 Mg Tabs (Spironolactone) .Marland Kitchen... 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  You will receive a reminder letter in the mail two months in advance. If you don't receive a letter, please call our office to schedule the follow-up appointment. 2)  Your physician recommends that you continue on  your current medications as directed. Please refer to the Current Medication list given to you today.

## 2010-03-02 NOTE — Letter (Signed)
Summary: Remote Device Check  [facility], Main Office  [address]   [place], [place] [address]   Phone: [phone]  Fax: [phone]     May 18, 2009 MRN: [id number]   [name] [address] [place], [place]  [address]   Dear Mr. Flaming,   Your remote transmission was recieved and reviewed by your physician.  All diagnostics were within normal limits for you.   ___X___Your next office visit is scheduled for:   JULY 2011 WITH DR Graciela Husbands. Please call our office to schedule an appointment.    Sincerely,  Proofreader

## 2010-03-02 NOTE — Letter (Signed)
Summary: Remote Device Check  [facility], Main Office  [address]   [place], [place] [address]   Phone: [phone]  Fax: [phone]     December 14, 2009 MRN: [id number]   [name] [address] [place], [place]  [address]   Dear [name],   Your remote transmission was recieved and reviewed by your physician.  All diagnostics were within normal limits for you.  __X___Your next transmission is scheduled for:  03-02-2010.  Please transmit at any time this day.  If you have a wireless device your transmission will be sent automatically.   Sincerely,  Vella Kohler

## 2010-03-02 NOTE — Cardiovascular Report (Signed)
Summary: Office Visit Remote   Office Visit Remote   Imported By: Roderic Ovens 12/16/2009 13:52:07  _____________________________________________________________________  External Attachment:    Type:   Image     Comment:   External Document

## 2010-03-02 NOTE — Letter (Signed)
Summary: Device-Delinquent [employer], newspaper, Main Office  [address]   [place], [place] [address]   Phone: [phone]  Fax: [phone]     September 09, 2009 MRN: [id number]   JULEZ HUSEBY [address] [place], [place]  [address]   Dear Mr. Shavers,  According to our records, you were scheduled for a device phone transmission on  09/08/2009.           .     We did not receive any results from this check.  If you transmitted on your scheduled day, please call us to help troubleshoot your system.  If you forgot to send your transmission, please send one upon receipt of this letter.  Thank you,  Altha Harm, LPN  September 09, 2009 11:01 AM  [facility]

## 2010-03-02 NOTE — Assessment & Plan Note (Signed)
Summary: rov/atrial fib/jml  Medications Added FINASTERIDE 5 MG TABS (FINASTERIDE) take one daily ALLOPURINOL 300 MG TABS (ALLOPURINOL) take 1/2 daily ALVESCO 80 MCG/ACT AERS (CICLESONIDE) twice a day        Visit Type:  Follow-up Primary Provider:  [name], MD  CC:  atrial fib.  History of Present Illness: This is a very pleasant 70 year old male patient of Dr. Hilliard Clark who went to the emergency room with rapid atrial fibrillation approximately one week ago. He states after eating dinner he felt his heart began to race and become irregular and it didn't calm down by midnight. In the ER he was given an extra Lanoxin and converted to normal rhythm. He spoke with Dr. [name] office who asked him to come here for an ICD check.  The patient denies any change in his medications other than changing inhaler to Alvesco for his asthma. He was on 160 mg dose but this has recently been decreased to 80 mg since his episode of atrial fibrillation. He denies caffeine intake. He is not had recurrence of the atrial fibrillation since the episode last week. He exercises and is very aware of his symptoms.  Current Medications (verified): 1)  Coreg 25 Mg Tabs (Carvedilol) .... 2 Tablets By Mouth Once Daily 2)  Lanoxin 0.125 Mg Tabs (Digoxin) .... One By Mouth Once Daily 3)  Prinivil 20 Mg Tabs (Lisinopril) .Marland Kitchen.. 1 1/2 Tablets  By Mouth Once Daily 4)  Coumadin 5 Mg Tabs (Warfarin Sodium) .... Take Half By Mouth Once Daily 5)  Simvastatin 40 Mg Tabs (Simvastatin) .... One By Mouth At Bedtime 6)  Adult Aspirin Ec Low Strength 81 Mg Tbec (Aspirin) .... One By Mouth Once Daily 7)  Pantoprazole Sodium 40 Mg Tbec (Pantoprazole Sodium) .... One By Mouth Once Daily 8)  Symbicort 80-4.5 Mcg/act Aero (Budesonide-Formoterol Fumarate) .... One Spray Per Nostril Two Times A Day 9)  Isosorbide Mononitrate Cr 30 Mg Xr24h-Tab (Isosorbide Mononitrate) .... Take 1/2 Tablet Once Daily 10)  Spironolactone 25 Mg Tabs  (Spironolactone) .... Take 1/2 Tablet By Mouth Once Daily 11)  Fish Oil 1200 Mg Caps (Omega-3 Fatty Acids) .... One By Mouth Two Times A Day 12)  Allegra 180 Mg Tabs (Fexofenadine Hcl) .... One By Mouth Once Daily 13)  Nexium 40 Mg  Cpdr (Esomeprazole Magnesium) .Marland Kitchen.. 1 Capsule Each Day 30 Minutes Before Meal 14)  Finasteride 5 Mg Tabs (Finasteride) .... Take One Daily 15)  Allopurinol 300 Mg Tabs (Allopurinol) .... Take 1/2 Daily  Allergies: 1)  Amoxicillin (Amoxicillin) 2)  Penicillin V Potassium (Penicillin V Potassium)  Past History:  Past Medical History: Last updated: 01/28/2009 Current Problems:  ASTHMA (ICD-493.90) NEPHROLITHIASIS (ICD-592.0) COUMADIN THERAPY (ICD-V58.61) IRRITABLE BOWEL SYNDROME (ICD-564.1) HYPERLIPIDEMIA (ICD-272.4) CARDIOMYOPATHY, ISCHEMIC (ICD-414.8) NEPHROLITHIASIS, HX OF (ICD-V13.01) GERD FIBRILLATION, ATRIAL (ICD-427.31) AICD-Medtronic Virtuoso A540  Past Surgical History: Last updated: 03/02/2008 implantable cardiodefibrillator 2000, reimplanted new defibrillator 2010 CABG Right knee surgery hernia repair  Review of Systems       see history of present illness  Vital Signs:  Patient profile:   70 year old male Height:      67 inches Weight:      159 pounds Pulse rate:   72 / minute Pulse rhythm:   regular BP sitting:   104 / 68  (right arm)  Vitals Entered By: Jacquelin Hawking, CMA (Jun 28, 2009 9:32 AM)  Physical Exam  General:   Well-nournished, in no acute distress. Neck: No JVD, HJR, Bruit, or thyroid enlargement Lungs:  No tachypnea, clear without wheezing, rales, or rhonchi Cardiovascular: RRR, PMI not displaced, heart sounds normal, no murmurs, gallops, bruit, thrill, or heave. Abdomen: BS normal. Soft without organomegaly, masses, lesions or tenderness. Extremities: without cyanosis, clubbing or edema. Good distal pulses bilateral SKin: Warm, no lesions or rashes  Musculoskeletal: No deformities Neuro: no focal  signs     ICD Specifications Following MD:  Sherryl Manges, MD     ICD Vendor:  Medtronic     ICD Model Number:  [id number]     ICD Serial Number:  [id number] H ICD DOI:  02/09/2008     ICD Implanting MD:  Sherryl Manges, MD  Lead 1:    Location: RV     DOI: 09/04/1999     Model #: [id number]     Serial #: [id number]     Status: active  Indications::  VT  Explantation Comments: 02-09-08 GDT 1860 EXPLANTED  ICD Follow Up ICD Dependent:  No      Brady Parameters Mode VVI     Lower Rate Limit:  40      Tachy Zones VF:  200     VT:  250 FVT VIA VF     VT1:  176     Impression & Recommendations:  Problem # 1:  FIBRILLATION, ATRIAL (ICD-427.31) Patient had one episode of atrial fibrillation prompting an emergency room visit treated with Lanoxin. ICD was interrogated today and did show one episode of increased heart rate up to 120 beats per minute but otherwise looked good. I told the patient he may take an extra Lanoxin if he goes into atrial fibrillation at home. His updated medication list for this problem includes:    Coreg 25 Mg Tabs (Carvedilol) .Marland Kitchen... 2 tablets by mouth once daily    Lanoxin 0.125 Mg Tabs (Digoxin) ..... One by mouth once daily    Coumadin 5 Mg Tabs (Warfarin sodium) .Marland Kitchen... Take half by mouth once daily    Adult Aspirin Ec Low Strength 81 Mg Tbec (Aspirin) ..... One by mouth once daily  Orders: EKG w/ Interpretation (93000)  Problem # 2:  IMPLANTATION OF DEFIBRILLATOR, MEDTRONIC VIRTUOSO D154 (ICD-V45.02) defibrillator functioning normally.  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 His updated medication list for this problem includes:    Coreg 25 Mg Tabs (Carvedilol) .Marland Kitchen... 2 tablets by mouth once daily    Lanoxin 0.125 Mg Tabs (Digoxin) ..... One by mouth once daily    Prinivil 20 Mg Tabs (Lisinopril) .Marland Kitchen... 1 1/2 tablets  by mouth once daily    Coumadin 5 Mg Tabs (Warfarin sodium) .Marland Kitchen... Take half by mouth  once daily    Adult Aspirin Ec Low Strength 81 Mg Tbec (Aspirin) ..... One by mouth once daily    Isosorbide Mononitrate Cr 30 Mg Xr24h-tab (Isosorbide mononitrate) .Marland Kitchen... Take 1/2 tablet once daily    Spironolactone 25 Mg Tabs (Spironolactone) .Marland Kitchen... Take 1/2 tablet by mouth once daily  Problem # 4:  ASTHMA (ICD-493.90) Patient Symbicort was recently changed to Alvesco which is a corticosteroid. The following medications were removed from the medication list:    Symbicort 80-4.5 Mcg/act Aero (Budesonide-formoterol fumarate) ..... One spray per nostril two times a day His updated medication list for this problem includes:    Alvesco 80 Mcg/act Aers (Ciclesonide) .Marland Kitchen... Twice a day  Patient Instructions: 1)  Your physician recommends that you schedule a follow-up appointment in: Pt. has an appointment with Dr. Graciela Husbands on July  26th at 9:20 AM

## 2010-03-02 NOTE — Progress Notes (Signed)
Summary: Test results  Medications Added NEXIUM 40 MG  CPDR (ESOMEPRAZOLE MAGNESIUM) 1 capsule each day 30 minutes before meal       Phone Note Call from Patient Call back at [phone] cell   Call For: Dr [name] Reason for Call: Lab or Test Results Summary of Call: Also wonders if he can get some samples of Nexium Initial call taken by: Leanor Kail [facility],  September 26, 2009 1:54 PM  Follow-up for Phone Call        Pt informed of IFOB results.  Samples of nexium given. Follow-up by: Ashok Cordia RN,  September 26, 2009 2:08 PM    New/Updated Medications: NEXIUM 40 MG  CPDR (ESOMEPRAZOLE MAGNESIUM) 1 capsule each day 30 minutes before meal

## 2010-03-02 NOTE — Assessment & Plan Note (Signed)
Summary: medtronic/sl  Medications Added ISOSORBIDE MONONITRATE CR 30 MG XR24H-TAB (ISOSORBIDE MONONITRATE) take 1/2 tablet once daily      Allergies Added:   Primary Provider:  [name], MD  CC:  medtronic.  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  The patient denies SOB, chest pain, edema or palpitations Does have mild dyspnea with significant exertion  Current Medications (verified): 1)  Coreg 25 Mg Tabs (Carvedilol) .... 2 Tablets By Mouth Once Daily 2)  Lanoxin 0.125 Mg Tabs (Digoxin) .... One By Mouth Once Daily 3)  Prinivil 20 Mg Tabs (Lisinopril) .Marland Kitchen.. 1 1/2 Tablets  By Mouth Once Daily 4)  Coumadin 5 Mg Tabs (Warfarin Sodium) .... Take Half By Mouth Once Daily 5)  Simvastatin 40 Mg Tabs (Simvastatin) .... One By Mouth At Bedtime 6)  Adult Aspirin Ec Low Strength 81 Mg Tbec (Aspirin) .... One By Mouth Once Daily 7)  Pantoprazole Sodium 40 Mg Tbec (Pantoprazole Sodium) .... One By Mouth Once Daily 8)  Symbicort 80-4.5 Mcg/act Aero (Budesonide-Formoterol Fumarate) .... One Spray Per Nostril Two Times A Day 9)  Isosorbide Mononitrate Cr 30 Mg Xr24h-Tab (Isosorbide Mononitrate) .... Take 1/2 Tablet Once Daily 10)  Spironolactone 25 Mg Tabs (Spironolactone) .... Take 1/2 Tablet By Mouth Once Daily 11)  Fish Oil 1200 Mg Caps (Omega-3 Fatty Acids) .... One By Mouth Two Times A Day 12)  Allegra 180 Mg Tabs (Fexofenadine Hcl) .... One By Mouth Once Daily 13)  Colchicine 0.6 Mg Tabs (Colchicine) .... One Tablet By Mouth As Needed 14)  Nexium 40 Mg  Cpdr (Esomeprazole Magnesium) .Marland Kitchen.. 1 Capsule Each Day 30 Minutes Before Meal  Allergies (verified): 1)  Amoxicillin (Amoxicillin) 2)  Penicillin V Potassium (Penicillin V  Potassium)  Past History:  Past Medical History: Last updated: 01/28/2009 Current Problems:  ASTHMA (ICD-493.90) NEPHROLITHIASIS (ICD-592.0) COUMADIN THERAPY (ICD-V58.61) IRRITABLE BOWEL SYNDROME (ICD-564.1) HYPERLIPIDEMIA (ICD-272.4) CARDIOMYOPATHY, ISCHEMIC (ICD-414.8) NEPHROLITHIASIS, HX OF (ICD-V13.01) GERD FIBRILLATION, ATRIAL (ICD-427.31) AICD-Medtronic Virtuoso J478  Past Surgical History: Last updated: 03/02/2008 implantable cardiodefibrillator 2000, reimplanted new defibrillator 2010 CABG Right knee surgery hernia repair  Family History: Last updated: 03/02/2008 Family History of Diabetes: Mother,Brother No FH of Colon Cancer:  Social History: Last updated: 03/02/2008 Married Patient has never smoked.  Alcohol Use - yes-socially Illicit Drug Use - no  Vital Signs:  Patient profile:   70 year old male Height:      67 inches Weight:      166 pounds BMI:     26.09 Pulse rate:   62 / minute Pulse rhythm:   regular BP sitting:   120 / 66  (left arm) Cuff size:   regular  Vitals Entered By: Judithe Modest CMA (February 01, 2009 9:25 AM)  Physical Exam  General:  The patient was alert and oriented in no acute distress. HEENT Normal.  Neck veins were 7cm , carotids were brisk.  Lungs were clear.  Heart sounds were regular without murmurs or gallops.  Abdomen was soft with active bowel sounds. There is no clubbing cyanosis or edema. Skin Warm and dry     ICD Specifications Following MD:  Sherryl Manges, MD     ICD Vendor:  Medtronic     ICD Model Number:  D154VWC     ICD Serial Number:  [id number] H ICD DOI:  02/09/2008     ICD Implanting MD:  Sherryl Manges, MD  Lead 1:    Location: RV     DOI: 09/04/1999     Model #: [id number]     Serial #: [id number]     Status: active  Indications::  VT  Explantation Comments: 02-09-08 GDT 1860 EXPLANTED  ICD Follow Up Remote Check?  No Battery Voltage:  3.20 V     Charge Time:  8.7 seconds     Underlying rhythm:  SR ICD  Dependent:  No       ICD Device Measurements Right Ventricle:  Amplitude: 19.6 mV, Impedance: 1008 ohms, Threshold: 1.0 V at 0.4 msec Shock Impedance: 49/68 ohms   Episodes Shock:  0     ATP:  0     Nonsustained:  3     Ventricular Pacing:  <0.1%  Brady Parameters Mode VVI     Lower Rate Limit:  40      Tachy Zones VF:  200     VT:  250 FVT VIA VF     VT1:  176     Next Remote Date:  05/03/2009     Next Cardiology Appt Due:  01/30/2010 Tech Comments:  Normal device function.  SIC-5, integrated bipolar lead, impedence trends stable.  Optivol was up, now returning to baseline.  RV output changed to 2.5V.  No other changes made.  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  ROV 12 months SK. 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 He is already on Aldactone as part of the Emphasis  trial  Problem # 3:  VENTRICULAR TACHYCARDIA (ICD-427.1) Recurrent episodes of nonsustained ventricular tachycardia prompted me to reprogram his intervals to detect from 16-32 to try to avoid premature ICD therapy  Problem # 4:  IMPLANTATION OF DEFIBRILLATOR, MEDTRONIC VIRTUOSO D154 (ICD-V45.02) normal device function reprogrammed as above  Patient Instructions: 1)  Your physician recommends that you schedule a follow-up appointment in: 6 months with Dr. Graciela Husbands

## 2010-03-02 NOTE — Progress Notes (Signed)
Summary: afib   Phone Note Call from Patient Call back at [phone]   Caller: Patient Reason for Call: Talk to Nurse Summary of Call: went into afib last night.... went to er, he did call Dr Tresa Endo and was told to call here Initial call taken by: Migdalia Dk,  Jun 22, 2009 2:15 PM  Follow-up for Phone Call        Spoke with pt. Patient was in the [facility] ER last night for A-fib. Pt. was to F/U with Dr. Tresa Endo his cardiology today. Dr. [name] office states pt. needs to be seen by Dr. Graciela Husbands instead, due to pt. having some issues with onderline palpitations,also needs to have to  interrogate his ICD.  Pt. has an appointment with the PA on 06/28/09 at 9:15 AM. Pt. aware. Follow-up by: Ollen Gross, RN, BSN,  Jun 22, 2009 3:08 PM

## 2010-03-03 NOTE — Cardiovascular Report (Signed)
Summary: Office Visit Remote  Office Visit Remote   Imported By: Roderic Ovens 05/18/2009 14:22:11  _____________________________________________________________________  External Attachment:    Type:   Image     Comment:   External Document

## 2010-03-03 NOTE — Cardiovascular Report (Signed)
Summary: Office Visit   Office Visit   Imported By: Roderic Ovens 02/01/2009 15:17:14  _____________________________________________________________________  External Attachment:    Type:   Image     Comment:   External Document

## 2010-03-06 ENCOUNTER — Other Ambulatory Visit: Payer: Self-pay | Admitting: Rheumatology

## 2010-03-06 ENCOUNTER — Ambulatory Visit
Admission: RE | Admit: 2010-03-06 | Discharge: 2010-03-06 | Disposition: A | Discharge status: Home / Self Care | Payer: Medicare Other | Source: Ambulatory Visit | Attending: Rheumatology | Admitting: Rheumatology

## 2010-03-06 DIAGNOSIS — M25541 Pain in joints of right hand: Secondary | ICD-10-CM

## 2010-03-08 NOTE — Letter (Signed)
Summary: [facility]  [facility]   Imported By: Marylou Mccoy 03/01/2010 11:45:18  _____________________________________________________________________  External Attachment:    Type:   Image     Comment:   External Document

## 2010-03-16 NOTE — Cardiovascular Report (Signed)
Summary: Office Visit   Office Visit   Imported By: Roderic Ovens 03/07/2010 16:05:15  _____________________________________________________________________  External Attachment:    Type:   Image     Comment:   External Document

## 2010-03-16 NOTE — Procedures (Signed)
Summary: [facility]  Medications Added ALLOPURINOL 300 MG TABS (ALLOPURINOL) Take 1 tablet by mouth once daily      Allergies Added:   Current Medications (verified): 1)  Coreg 25 Mg Tabs (Carvedilol) .... 2 Tablets By Mouth Once Daily 2)  Lanoxin 0.125 Mg Tabs (Digoxin) .... One By Mouth Once Daily 3)  Prinivil 20 Mg Tabs (Lisinopril) .Marland Kitchen.. 1 1/2 Tablets  By Mouth Once Daily 4)  Coumadin 5 Mg Tabs (Warfarin Sodium) .... Take Half By Mouth Once Daily 5)  Simvastatin 40 Mg Tabs (Simvastatin) .... One By Mouth At Bedtime 6)  Adult Aspirin Ec Low Strength 81 Mg Tbec (Aspirin) .... One By Mouth Once Daily 7)  Pantoprazole Sodium 40 Mg Tbec (Pantoprazole Sodium) .... One By Mouth Once Daily( Will Use When Out of Nexium) 8)  Isosorbide Mononitrate Cr 30 Mg Xr24h-Tab (Isosorbide Mononitrate) .... Take 1/2 Tablet Once Daily 9)  Spironolactone 25 Mg Tabs (Spironolactone) .... Take 1/2 Tablet By Mouth Once Daily 10)  Fish Oil 1200 Mg Caps (Omega-3 Fatty Acids) .... One By Mouth Two Times A Day 11)  Allegra 180 Mg Tabs (Fexofenadine Hcl) .... One By Mouth Once Daily 12)  Nexium 40 Mg  Cpdr (Esomeprazole Magnesium) .Marland Kitchen.. 1 Capsule Each Day 30 Minutes Before Meal 13)  Finasteride 5 Mg Tabs (Finasteride) .... Take One Daily 14)  Allopurinol 300 Mg Tabs (Allopurinol) .... Take 1 Tablet By Mouth Once Daily 15)  Alvesco 80 Mcg/act Aers (Ciclesonide) .... Twice A Day  Allergies (verified): 1)  Amoxicillin (Amoxicillin) 2)  Penicillin V Potassium (Penicillin V Potassium)   ICD Specifications Following MD:  Sherryl Manges, MD     ICD Vendor:  Medtronic     ICD Model Number:  D154VWC     ICD Serial Number:  [id number] ICD DOI:  02/09/2008     ICD Implanting MD:  Sherryl Manges, MD  Lead 1:    Location: RV     DOI: 09/04/1999     Model #: [id number]     Serial #: [id number]     Status: active  Indications::  VT  Explantation Comments: 02-09-08 GDT 1860 EXPLANTED  ICD Follow Up Battery Voltage:  3.16  V     Charge Time:  9.2 seconds     Underlying rhythm:  SR ICD Dependent:  No       ICD Device Measurements Right Ventricle:  Amplitude: 19.6 mV, Impedance: 1056 ohms, Threshold: 1.0 V at 0.4 msec Shock Impedance: 51/78 ohms   Episodes MS Episodes:  0     Coumadin:  Yes Shock:  0     ATP:  0     Nonsustained:  1     Ventricular Pacing:  0.1%  Brady Parameters Mode VVI     Lower Rate Limit:  40      Tachy Zones VF:  200     VT:  250 FVT VIA VF     VT1:  176     Next Remote Date:  06/01/2010     Tech Comments:  1 NST EPISODE.  NORMAL DEVICE FUNCTION.  CHANGED MAX LEAD IMPEDANCE FOR LIA.  NO CHANGES MADE. CARELINK 06-01-10. Vella Kohler  March 05, 2010 12:23 PM

## 2010-03-23 ENCOUNTER — Encounter: Payer: Self-pay | Admitting: Gastroenterology

## 2010-03-23 ENCOUNTER — Other Ambulatory Visit: Payer: Medicare Other

## 2010-03-23 ENCOUNTER — Other Ambulatory Visit: Payer: Self-pay | Admitting: Gastroenterology

## 2010-03-23 ENCOUNTER — Encounter (INDEPENDENT_AMBULATORY_CARE_PROVIDER_SITE_OTHER): Payer: Self-pay | Admitting: *Deleted

## 2010-03-23 ENCOUNTER — Ambulatory Visit (INDEPENDENT_AMBULATORY_CARE_PROVIDER_SITE_OTHER): Payer: Medicare Other | Admitting: Gastroenterology

## 2010-03-23 DIAGNOSIS — K219 Gastro-esophageal reflux disease without esophagitis: Secondary | ICD-10-CM

## 2010-03-23 LAB — CBC WITH DIFFERENTIAL/PLATELET
Basophils Absolute: 0.1 10*3/uL (ref 0.0–0.1)
Basophils Relative: 0.9 % (ref 0.0–3.0)
Eosinophils Relative: 5.6 % — ABNORMAL HIGH (ref 0.0–5.0)
Hemoglobin: 14.8 g/dL (ref 13.0–17.0)
Lymphocytes Relative: 17.4 % (ref 12.0–46.0)
MCHC: 33.6 g/dL (ref 30.0–36.0)
Monocytes Relative: 11.8 % (ref 3.0–12.0)
Neutrophils Relative %: 64.3 % (ref 43.0–77.0)
Platelets: 253 10*3/uL (ref 150.0–400.0)
RDW: 14.3 % (ref 11.5–14.6)
WBC: 11.2 10*3/uL — ABNORMAL HIGH (ref 4.5–10.5)

## 2010-03-23 LAB — HEPATIC FUNCTION PANEL
ALT: 19 U/L (ref 0–53)
Bilirubin, Direct: 0.1 mg/dL (ref 0.0–0.3)
Total Bilirubin: 0.8 mg/dL (ref 0.3–1.2)

## 2010-03-23 LAB — BASIC METABOLIC PANEL
BUN: 12 mg/dL (ref 6–23)
CO2: 28 mEq/L (ref 19–32)
Glucose, Bld: 66 mg/dL — ABNORMAL LOW (ref 70–99)

## 2010-03-23 LAB — IBC PANEL
Saturation Ratios: 18.5 % — ABNORMAL LOW (ref 20.0–50.0)
Transferrin: 331.7 mg/dL (ref 212.0–360.0)

## 2010-03-28 ENCOUNTER — Other Ambulatory Visit: Payer: Self-pay | Admitting: Gastroenterology

## 2010-03-28 ENCOUNTER — Telehealth: Payer: Self-pay | Admitting: Gastroenterology

## 2010-03-28 DIAGNOSIS — Z7901 Long term (current) use of anticoagulants: Secondary | ICD-10-CM

## 2010-03-28 DIAGNOSIS — I5022 Chronic systolic (congestive) heart failure: Secondary | ICD-10-CM

## 2010-03-28 DIAGNOSIS — K5732 Diverticulitis of large intestine without perforation or abscess without bleeding: Secondary | ICD-10-CM

## 2010-03-28 NOTE — Letter (Signed)
Summary: [facility]: Immunoassay Fecal Occult Blood (iFOB) Order [facility]  [address] [place], [place] [address]   Phone: [phone]  Fax: [phone]      [facility]: Immunoassay Fecal Occult Blood (iFOB) Order Form   March 23, 2010 MRN: [id number]   [name] January 17, 1941   Physicican Name: [name]  Diagnosis Code:564.1      Harlow Mares CMA (AAMA)

## 2010-03-28 NOTE — Assessment & Plan Note (Addendum)
Summary: Follow-up GERD and IBS    History of Present Illness Visit Type: law Primary GI MD: Sheryn Bison MD FACP FAGA Primary Provider: [name], MD Requesting Provider: na Chief Complaint: Monitoring IBS and GERD 6 month History of Present Illness:   extensive dictation lost by the system. Basically back to her as chronic GERD well controlled with daily Nexium. He has severe cardiovascular issues followed by Dr. Nicki Guadalajara and Dr. [name]. He denies lower gastrointestinal, hepatobiliary, or upper GI complaints on Nexium 40 mg a day. Labs and passive been unremarkable as have stool cards for occult blood. He has not been felt to be a candidate for colonoscopy because of his anticoagulation status and his chronic congestive heart failure, valvular dysfunction, and pulmonary hypertension.   GI Review of Systems      Denies abdominal pain, acid reflux, belching, bloating, chest pain, dysphagia with liquids, dysphagia with solids, heartburn, loss of appetite, nausea, vomiting, vomiting blood, weight loss, and  weight gain.        Denies anal fissure, black tarry stools, change in bowel habit, constipation, diarrhea, diverticulosis, fecal incontinence, heme positive stool, hemorrhoids, irritable bowel syndrome, jaundice, light color stool, liver problems, rectal bleeding, and  rectal pain.    Current Medications (verified): 1)  Coreg 25 Mg Tabs (Carvedilol) .... 2 Tablets By Mouth Once Daily 2)  Lanoxin 0.125 Mg Tabs (Digoxin) .... One By Mouth Once Daily 3)  Prinivil 20 Mg Tabs (Lisinopril) .Marland Kitchen.. 1 1/2 Tablets  By Mouth Once Daily 4)  Coumadin 5 Mg Tabs (Warfarin Sodium) .... Take Half By Mouth Once Daily 5)  Simvastatin 40 Mg Tabs (Simvastatin) .... One By Mouth At Bedtime 6)  Adult Aspirin Ec Low Strength 81 Mg Tbec (Aspirin) .... One By Mouth Once Daily 7)  Isosorbide Mononitrate Cr 30 Mg Xr24h-Tab (Isosorbide Mononitrate) .... Take 1/2 Tablet Once Daily 8)   Spironolactone 25 Mg Tabs (Spironolactone) .... Take 1/2 Tablet By Mouth Once Daily 9)  Fish Oil 1200 Mg Caps (Omega-3 Fatty Acids) .... One By Mouth Two Times A Day 10)  Allegra 180 Mg Tabs (Fexofenadine Hcl) .... One By Mouth Once Daily 11)  Nexium 40 Mg  Cpdr (Esomeprazole Magnesium) .Marland Kitchen.. 1 Capsule Each Day 30 Minutes Before Meal 12)  Finasteride 5 Mg Tabs (Finasteride) .... Take One Daily 13)  Allopurinol 300 Mg Tabs (Allopurinol) .... Take 1 Tablet By Mouth Once Daily 14)  Alvesco 80 Mcg/act Aers (Ciclesonide) .... Twice A Day  Allergies (verified): 1)  Amoxicillin (Amoxicillin) 2)  Penicillin V Potassium (Penicillin V Potassium)  Past History:  Past medical, surgical, family and social histories (including risk factors) reviewed for relevance to current acute and chronic problems.  Past Medical History: Reviewed history from 01/28/2009 and no changes required. 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 implantable cardiodefibrillator 2000, reimplanted new defibrillator 2010 CABG Right knee surgery hernia repair  Family History: Reviewed history from 03/02/2008 and no changes required. Family History of Diabetes: Mother,Brother No FH of Colon Cancer:  Social History: Reviewed history from 03/02/2008 and no changes required. Married Patient has never smoked.  Alcohol Use - yes-socially Illicit Drug Use - no  Review of Systems       The patient complains of allergy/sinus.  The patient denies anemia, anxiety-new, arthritis/joint pain,  back pain, blood in urine, breast changes/lumps, change in vision, confusion, cough, coughing up  blood, depression-new, fainting, fatigue, fever, headaches-new, hearing problems, heart murmur, heart rhythm changes, itching, menstrual pain, muscle pains/cramps, night sweats, nosebleeds, pregnancy symptoms, shortness of breath, skin rash, sleeping problems, sore throat, swelling of feet/legs, swollen lymph glands, thirst - excessive , urination - excessive , urination changes/pain, urine leakage, vision changes, and voice change.    Vital Signs:  Patient profile:   70 year old male Height:      67 inches Weight:      160.50 pounds BMI:     25.23 Pulse rate:   60 / minute Pulse rhythm:   regular BP sitting:   108 / 64  (left arm) Cuff size:   regular  Vitals Entered By: June McMurray CMA Duncan Dull) (March 23, 2010 10:41 AM)  Physical Exam  General:  Well developed, well nourished, no acute distress.healthy appearing.   Head:  Normocephalic and atraumatic. Eyes:  PERRLA, no icterus.exam deferred to patient's ophthalmologist.   Lungs:  Clear throughout to auscultation. Heart:  Regular rate and rhythm; no murmurs, rubs,  or bruits. Abdomen:  Soft, nontender and nondistended. No masses, hepatosplenomegaly or hernias noted. Normal bowel sounds. Extremities:  No clubbing, cyanosis, edema or deformities noted. Neurologic:  Alert and  oriented x4;  grossly normal neurologically. Psych:  Alert and cooperative. Normal mood and affect.   Impression & Recommendations:  Problem # 1:  GERD (ICD-530.81) Assessment Improved Continue antireflux regime with daily PT choice. I have recommended to him that he take adequate supplemental calcium with vitamin D for his chronic PPI use. Screening labs also ordered including magnesium level. Orders: TLB-CBC Platelet - w/Differential (85025-CBCD) TLB-BMP (Basic Metabolic Panel-BMET) (80048-METABOL) TLB-Hepatic/Liver Function Pnl (80076-HEPATIC) TLB-TSH (Thyroid Stimulating Hormone) (84443-TSH) TLB-B12, Serum-Total ONLY (11914-N82) TLB-Ferritin  (82728-FER) TLB-Folic Acid (Folate) (82746-FOL) TLB-IBC Pnl (Iron/FE;Transferrin) (83550-IBC) TLB-Magnesium (Mg) (83735-MG)  Problem # 2:  DIVERTICULITIS, ACUTE (ICD-562.11) Assessment: Improved Repeat  IFOB stool cards...we will try to also get him approved for CT colonoscopy. Orders: [facility] ([facility])  Problem # 3:  CARDIOMYOPATHY, ISCHEMIC (ICD-414.8) Assessment: Improved continue multiple medications per Dr. [name] and Dr. Daphene Jaeger.  Patient Instructions: 1)  Copy sent to : [name], MD and Dr. Nicki Guadalajara at [facility]. 2)  We will check to see insurance coverage on a CT Colonoscopy and call you back about scheduling.  3)  Please go to the basement today for your labs.  4)  Stay on your Nexium.  5)  The medication list was reviewed and reconciled.  All changed / newly prescribed medications were explained.  A complete medication list was provided to the patient / caregiver. 6)  Avoid foods high in acid content ( tomatoes, citrus juices, spicy foods) . Avoid eating within 3 to 4 hours of lying down or before exercising. Do not over eat; try smaller more frequent meals. Elevate head of bed four inches when sleeping.  Prescriptions: NEXIUM 20 MG PACK (ESOMEPRAZOLE MAGNESIUM) take one by mouth once daily  #30 x 6   Entered by:   Harlow Mares CMA (AAMA)   Authorized by:   Mardella Layman MD [facility]   Signed by:   Harlow Mares CMA (AAMA) on 03/23/2010   Method used:   Electronically to        [facility] [address] (retail)       [phone]       [facility]  [place], [place]  [address]       Ph: [phone]       Fax: [phone]   RxID:   [id number] PANTOPRAZOLE SODIUM 40 MG TBEC (PANTOPRAZOLE SODIUM) take one by mouth once daily  #30 x 6   Entered by:   Harlow Mares CMA (AAMA)   Authorized by:   Mardella Layman MD [facility]   Signed by:   Harlow Mares CMA (AAMA) on 03/23/2010   Method used:    Electronically to        [facility] [address] (retail)       [phone]       [place], [place]  [address]       Ph: [phone]       Fax: [phone]   RxID:   [id number]   Appended Document: Follow-up GERD and IBS this patient does not have diverticulitis. Problem #2 should have read screening for colon carcinoma. To my knowledge she has not had previous colonoscopy or barium studies of his gut. He certainly does not have acute diverticulitis. This seems to be a narrowed in my dictation which was originally lost by computer error. Please notify the patient.  Appended Document: Follow-up GERD and IBS pt aware.

## 2010-03-29 ENCOUNTER — Other Ambulatory Visit: Payer: Self-pay | Admitting: Gastroenterology

## 2010-03-29 ENCOUNTER — Other Ambulatory Visit: Payer: Medicare Other

## 2010-03-29 ENCOUNTER — Encounter (INDEPENDENT_AMBULATORY_CARE_PROVIDER_SITE_OTHER): Payer: Self-pay | Admitting: *Deleted

## 2010-03-29 DIAGNOSIS — K589 Irritable bowel syndrome without diarrhea: Secondary | ICD-10-CM

## 2010-03-29 LAB — FECAL OCCULT BLOOD, IMMUNOCHEMICAL: Fecal Occult Bld: NEGATIVE

## 2010-04-06 NOTE — Progress Notes (Signed)
Summary: ? about dx   Phone Note Call from Patient Call back at Home Phone [phone]   Caller: Patient Call For: Dr. [name] Reason for Call: Talk to Nurse Summary of Call: Requesting to speak directly to nurse about his COL Initial call taken by: Karna Christmas,  March 28, 2010 10:33 AM  Follow-up for Phone Call        patient was told that he has DIVERTICULITIS by [facility] and he states that has never had this and wondering where this comes from.  Follow-up by: Harlow Mares CMA Duncan Dull),  March 28, 2010 1:46 PM

## 2010-04-17 LAB — CBC
RBC: 5.4 MIL/uL (ref 4.22–5.81)
WBC: 12.6 10*3/uL — ABNORMAL HIGH (ref 4.0–10.5)

## 2010-04-17 LAB — DIFFERENTIAL
Basophils Absolute: 0.1 10*3/uL (ref 0.0–0.1)
Lymphocytes Relative: 23 % (ref 12–46)
Neutro Abs: 7.7 10*3/uL (ref 1.7–7.7)

## 2010-04-17 LAB — DIGOXIN LEVEL: Digoxin Level: 0.6 ng/mL — ABNORMAL LOW (ref 0.8–2.0)

## 2010-04-17 LAB — POCT CARDIAC MARKERS

## 2010-04-17 LAB — BASIC METABOLIC PANEL
Calcium: 9.4 mg/dL (ref 8.4–10.5)
Creatinine, Ser: 0.96 mg/dL (ref 0.4–1.5)
GFR calc Af Amer: >60 mL/min (ref >60)

## 2010-04-17 LAB — PROTIME-INR: INR: 2.02 — ABNORMAL HIGH (ref 0.00–1.49)

## 2010-05-17 ENCOUNTER — Encounter (HOSPITAL_COMMUNITY)
Admission: RE | Admit: 2010-05-17 | Discharge: 2010-05-17 | Disposition: A | Discharge status: Home / Self Care | Payer: Medicare Other | Source: Ambulatory Visit | Attending: Otolaryngology | Admitting: Otolaryngology

## 2010-05-17 LAB — SURGICAL PCR SCREEN: MRSA, PCR: NEGATIVE

## 2010-05-17 LAB — BASIC METABOLIC PANEL
CO2: 28 mEq/L (ref 19–32)
Calcium: 9.1 mg/dL (ref 8.4–10.5)
Chloride: 104 mEq/L (ref 96–112)
GFR calc Af Amer: >60 mL/min (ref >60)
Sodium: 136 mEq/L (ref 135–145)

## 2010-05-17 LAB — CBC
Hemoglobin: 15.1 g/dL (ref 13.0–17.0)
MCH: 29 pg (ref 26.0–34.0)
MCV: 84.6 fL (ref 78.0–100.0)
RBC: 5.2 MIL/uL (ref 4.22–5.81)

## 2010-05-31 ENCOUNTER — Other Ambulatory Visit (INDEPENDENT_AMBULATORY_CARE_PROVIDER_SITE_OTHER): Payer: Self-pay | Admitting: Otolaryngology

## 2010-05-31 ENCOUNTER — Ambulatory Visit (HOSPITAL_COMMUNITY)
Admission: RE | Admit: 2010-05-31 | Discharge: 2010-06-01 | Disposition: A | Discharge status: Home / Self Care | Payer: Medicare Other | Source: Ambulatory Visit | Attending: Otolaryngology | Admitting: Otolaryngology

## 2010-05-31 DIAGNOSIS — J322 Chronic ethmoidal sinusitis: Secondary | ICD-10-CM | POA: Insufficient documentation

## 2010-05-31 DIAGNOSIS — J338 Other polyp of sinus: Secondary | ICD-10-CM | POA: Insufficient documentation

## 2010-05-31 DIAGNOSIS — I2510 Atherosclerotic heart disease of native coronary artery without angina pectoris: Secondary | ICD-10-CM | POA: Insufficient documentation

## 2010-05-31 DIAGNOSIS — I10 Essential (primary) hypertension: Secondary | ICD-10-CM | POA: Insufficient documentation

## 2010-05-31 DIAGNOSIS — J342 Deviated nasal septum: Secondary | ICD-10-CM | POA: Insufficient documentation

## 2010-05-31 DIAGNOSIS — J330 Polyp of nasal cavity: Secondary | ICD-10-CM | POA: Insufficient documentation

## 2010-05-31 DIAGNOSIS — Z01812 Encounter for preprocedural laboratory examination: Secondary | ICD-10-CM | POA: Insufficient documentation

## 2010-05-31 DIAGNOSIS — I4891 Unspecified atrial fibrillation: Secondary | ICD-10-CM | POA: Insufficient documentation

## 2010-05-31 DIAGNOSIS — J323 Chronic sphenoidal sinusitis: Secondary | ICD-10-CM | POA: Insufficient documentation

## 2010-05-31 DIAGNOSIS — Z8673 Personal history of transient ischemic attack (TIA), and cerebral infarction without residual deficits: Secondary | ICD-10-CM | POA: Insufficient documentation

## 2010-05-31 DIAGNOSIS — J321 Chronic frontal sinusitis: Secondary | ICD-10-CM | POA: Insufficient documentation

## 2010-05-31 DIAGNOSIS — J320 Chronic maxillary sinusitis: Secondary | ICD-10-CM | POA: Insufficient documentation

## 2010-05-31 HISTORY — PX: NASAL SINUS SURGERY: SHX719

## 2010-05-31 LAB — PROTIME-INR: Prothrombin Time: 15.1 seconds (ref 11.6–15.2)

## 2010-06-01 ENCOUNTER — Ambulatory Visit (INDEPENDENT_AMBULATORY_CARE_PROVIDER_SITE_OTHER): Payer: Medicare Other | Admitting: *Deleted

## 2010-06-01 ENCOUNTER — Other Ambulatory Visit: Payer: Self-pay

## 2010-06-01 DIAGNOSIS — I5022 Chronic systolic (congestive) heart failure: Secondary | ICD-10-CM

## 2010-06-01 DIAGNOSIS — I4891 Unspecified atrial fibrillation: Secondary | ICD-10-CM

## 2010-06-01 DIAGNOSIS — I472 Ventricular tachycardia: Secondary | ICD-10-CM

## 2010-06-07 ENCOUNTER — Encounter: Payer: Self-pay | Admitting: *Deleted

## 2010-06-08 NOTE — Progress Notes (Signed)
icd remote w/icm  

## 2010-06-10 NOTE — Op Note (Signed)
[name], [name]                ACCOUNT NO.:  [id number]  MEDICAL RECORD NO.:  [id number]           PATIENT TYPE:  O  LOCATION:  3732                         FACILITY:  [facility]  PHYSICIAN:  Newman Pies, MD            DATE OF BIRTH:  [birth year]  DATE OF PROCEDURE:  05/31/2010 DATE OF DISCHARGE:                              OPERATIVE REPORT   SURGEON:  Newman Pies, MD  PREOPERATIVE DIAGNOSES: 1. Bilateral chronic rhinosinusitis, involving bilateral maxillary     sinus, bilateral ethmoid sinus, left sphenoid sinus, and left     frontal sinus. 2. Bilateral nasal and sinus polyposis. 3. Severe nasal septal deviation.  POSTOPERATIVE DIAGNOSES: 1. Bilateral chronic rhinosinusitis, involving bilateral maxillary     sinus, bilateral ethmoid sinus, left sphenoid sinus, and left     frontal sinus. 2. Bilateral nasal and sinus polyposis. 3. Severe nasal septal deviation.  PROCEDURES PERFORMED: 1. Left endoscopic frontal sinusotomy with polyp removal. 2. Bilateral total ethmoidectomy. 3. Bilateral maxillary antrostomy with polyp removal. 4. Left sphenoidotomy with polyp removal. 5. Septoplasty.  ANESTHESIA:  General endotracheal tube anesthesia.  COMPLICATIONS:  None.  ESTIMATED BLOOD LOSS:  200 mL.  INDICATIONS FOR PROCEDURE:  The patient is a 70 year old male with a history of chronic nasal obstruction and frequent recurrent rhinosinusitis.  The patient had a history of septoplasty previously in [place] several decades ago.  However, he was noted to have severe nasal septal deviation to the right on examination.  In addition, the patient has a history of bilateral severe nasal polyposis, obstructing his sinus drainage pathway.  He has been experiencing numerous sinus infections over the past several years.  He was treated with numerous antibiotics over the years.  Despite the treatment, he continues to be symptomatic.  His nasal polyps are noted to continue to progress.   The patient has been seeing Dr. Corinda Gubler and Dr. Mansfield Callas for allergy management.  Despite maximal medical therapy, he continues to be symptomatic.  Based on the above findings, the decision was made for the patient to undergo the above-stated procedures.  The risks, benefits, alternatives, and details of the procedures were discussed with the patient.  Questions were invited and answered.  Informed consent was obtained.  DESCRIPTION OF PROCEDURE:  The patient was taken to the operating room and placed supine on the operating table.  General endotracheal tube anesthesia was administered by the anesthesiologist.  It should be noted that the patient's defibrillator was deactivated prior to the surgery by a [employer] technician.  Preop IV antibiotics were given.  The patient was positioned and prepped and draped in a standard fashion for nasal surgery.  Pledgets soaked with Afrin were placed in both nasal cavities. The pledgets were subsequently removed.  Examination of the nasal cavities revealed bilateral nasal polyposis, obstructing the superior nasal cavity as well as both middle meatus.  He was also noted to have severe nasal septal deviation to the right anteriorly.  A standard hemitransfixion incision was made on the left side.  The standard mucosal flap was elevated in a standard fashion.  It should  be noted that the patient has a large area within the central nasal septum where cartilage was missing, likely secondary to his previous surgery.  The deviated portion of the remaining cartilage and bony septum were removed.  The cartilage was morselized and replaced.  The septum was quilted with 4-0 plain gut sutures.  The hemitransfixion incision was closed with interrupted 4-0 chromic sutures.  Attention was then focused on the right side.  The large nasal polyp was removed with a combination of Blakesley forceps and microdebrider.  The middle turbinate was then medialized, exposing  the middle meatus.  Large nasal polyps were removed with a combination of Blakesley forceps, Tru- Cut forceps, and microdebrider.  The uncinate process was removed.  The maxillary antrum was significantly enlarged.  Polypoid tissue was removed from the maxillary antrum and the maxillary cavity.  Attention was then focused on the right ethmoid sinus.  The anterior and posterior ethmoid sinuses were removed using the suction catheter and Tru-Cut forceps.  Polypoid tissue was removed from the ethmoid sinus. Hemostasis was achieved with pledgets soaked with Afrin.  The same procedure was then repeated on the left side without exception.  The patient was also noted to have a significant amount of polypoid tissue obstructing the maxillary sinus opening and the ethmoid cavity.  In addition, the patient was also noted to have large polyp obstructing the opening of the left sphenoid sinus.  The sphenoid sinus antrum was enlarged.  The polyp was removed.  The frontal recess of the left nasal cavity was subsequently enlarged by removing the superior uncinate process and the anterior ethmoid wall.  Polypoid tissue was removed from the frontal recess.  After polyp removal, the frontal recess was noted to be patent.  No purulent drainage was noted.  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  The lateral portion of the left middle turbinate and the inferior one-half of each inferior turbinates were carefully resected in order to improve the nasal airway.  Nasopore packing was placed within the middle meatus bilaterally.  A Doyle splint was applied to the nasal septum on each side.  The Doyle splints were secured in place with 2-0 Prolene sutures. That concluded the procedure for the patient.  The care of the patient was turned over to the anesthesiologist.  The patient was awakened  from anesthesia without difficulty.  He was extubated and transported to the recovery room in good condition.  OPERATIVE FINDINGS:  Bilateral nasal polyposis, filling bilateral ethmoid sinuses, maxillary antrum and sinuses, left frontal recess, and the left sphenoid sinus.  SPECIMEN:  Bilateral sinus contents.  FOLLOWUP CARE:  The patient will be observed overnight secondary to his history of cardiomyopathy.  He will be discharged home on postop day #1. He will be placed on Vicodin 1-2 tablets p.o. q.4-6 h. p.r.n. pain.  He will follow up in my office in approximately 5 days.     Newman Pies, MD     ST/MEDQ  D:  05/31/2010  T:  05/31/2010  Job:  [id number]  cc:   Kerry Kass, M.D. [facility]  Electronically Signed by Newman Pies MD on 06/10/2010 09:51:28 AM

## 2010-06-13 ENCOUNTER — Telehealth: Payer: Self-pay | Admitting: Gastroenterology

## 2010-06-13 MED ORDER — ESOMEPRAZOLE MAGNESIUM 40 MG PO CPDR: 40.0000 mg | DELAYED_RELEASE_CAPSULE | Freq: Every day | ORAL | Status: DC

## 2010-06-13 NOTE — H&P (Signed)
NAMEMADOX, CORKINS NO.:  [id number]   MEDICAL RECORD NO.:  [id number]          PATIENT TYPE:  INP   LOCATION:  1439                         FACILITY:  [facility]   PHYSICIAN:  Donalynn Furlong, MD      DATE OF BIRTH:  12-Sep-1940   DATE OF ADMISSION:  04/26/2007  DATE OF DISCHARGE:                              HISTORY & PHYSICAL   PRIMARY CARE Stacey Sago:  Dr. Leonides Sake   CHIEF COMPLAINT:  Ankle cellulitis.   HISTORY OF PRESENTING ILLNESS:  Mr. Mano is a 70 year old gentleman who  presented to [facility] ER tonight.  He has a history of redness, pain,  swelling over the left ankle area for about 10 days.  He was seen in the  ER 10 days ago and was started on Keflex and prednisone at that time and  his symptoms got better but subsequently got worse so he saw his primary  care at [facility] and they put him on clindamycin.  It got better again and  then it recurred with excruciating pain.  Because of severe pain, the  patient decided to come to the ER again.  The patient was able to move  his ankle and he was able to walk on his leg when it was better.   PAST MEDICAL HISTORY:  1. Coronary artery disease.  2. Asthma.  3. Hypertension.  4. Acid reflux.  5. Ventricular tachycardia status post ICD placement.  6. Coronary artery bypass surgery.  7. Atrial fibrillation.  8. Ischemic cardiomyopathy.  9. Hyperlipidemia.  10.Vertigo.  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  12.Sigmoid diverticulosis.   ALLERGIES:  Questionable allergy to AMOXICILLIN and PENICILLIN.  The  patient has tolerated Keflex.   FAMILY HISTORY:  Nothing contributory.   SOCIAL HISTORY:  No history of alcohol, drug, tobacco use.   REVIEW OF SYSTEMS:  Positive as per HPI, otherwise negative.  Review of  systems done for 14 systems.   PHYSICAL EXAMINATION:  VITAL SIGNS:  Blood pressure 96/48 at the time of  presentation, then it increased to 150/73.  Pulse 95, respirations 20,  temperature 98.1,  oxygen saturation 98% on room air.  GENERAL EXAM:  Alert and oriented x3, not in acute distress.  CARDIOVASCULAR:  S1, S2, regular.  No murmur, rub, gallop.  LUNGS:  Clear to auscultation bilaterally.  No wheezing, rhonchi,  crackles.  ABDOMEN:  Nondistended, nondistended, bowel sounds present.  EXTREMITIES:  Right lower extremity unremarkable.  Left ankle is puffy,  red, tender to touch with reduced range of motion.  HEAD:  Normocephalic, nontraumatic.  EYES:  Pupils equal and reactive to light and accommodation.  Extraocular muscles intact.  ORAL CAVITY:  Oral mucosa moist.  No thrush noted.  NECK:  No thyromegaly or JVD.  SPINE:  Nontender.  SKIN:  No rash or bruises.  NEUROLOGICAL:  Shows intact cranial nerves, muscle strength and  sensation.  LYMPH:  No lymph node enlargement.   MEDICATIONS AT HOME:  Include Lanoxin, carvedilol, Prinivil, Coumadin,  simvastatin, aspirin, Protonix, Symbicort, Zyrtec, spironolactone,  Coreg, isosorbide mononitrate.   LABORATORY STUDIES:  Show  sodium 133, potassium 3.8, chloride 102,  carbon dioxide 26, glucose 125, BUN 14, creatinine 1.05, calcium 8.7,  total protein 5.8, albumin 2.8, SGOT 16, SGPT 23, alkaline phosphatase  33, and total bilirubin 1.1.  GFR more than 60.  INR 2.5, PT 28.1.  WBC  18.8, hemoglobin 14.7, platelets 352.   ASSESSMENT AND PLAN:  1. Left ankle redness, pain, swelling, with possible cellulitis.      Cannot exclude underlying septic arthritis of ankle.  The patient      is partially treated with antibiotic in the past.  2. History of coronary artery disease.  3. Ischemic cardiomyopathy.  4. History of ventricular tachycardia status post internal      cardioverter-defibrillator placement.  5. Atrial fibrillation.  6. Hyperlipidemia.   PLAN:  Will admit the patient to telemetry bed.  Will start IV  antibiotics of vancomycin and Rocephin at septic arthritis protocol.  Will get CT scan of lower extremity because we  cannot get MRI due to his  pacemaker.  We will also get imaging x-rays of the left ankle.  Will  advise ankle elevation at this time.  The patient is partially treated  with antibiotics which may decrease the yield of synovial fluid analysis  for infection at this time, but we will start the treatment empirically  at this point.  The patient has tolerated cephalexin in the past so we  will start with Rocephin also.  We will continue home medications for  his cardiac condition.  Further plan and workup according to the labs  pending.  We will get synovial fluid analysis under interventional  radiology guidance because of severe swelling, it will be hard to get a  tap at this time.  We will get orthopedic consult if needed also.      Donalynn Furlong, MD  Electronically Signed     TVP/MEDQ  D:  04/27/2007  T:  04/27/2007  Job:  [id number]   cc:   Ramiro Harvest, MD   Melida Quitter, M.D.  Fax: [phone]

## 2010-06-13 NOTE — Discharge Summary (Signed)
[name], [name]                ACCOUNT NO.:  [id number]   MEDICAL RECORD NO.:  [id number]          PATIENT TYPE:  INP   LOCATION:  1439                         FACILITY:  [facility]   PHYSICIAN:  [name], M.D.DATE OF BIRTH:  [birth year]   DATE OF ADMISSION:  04/26/2007  DATE OF DISCHARGE:  05/05/2007                               DISCHARGE SUMMARY   DISCHARGE DIAGNOSES:  1. Left lower leg/ankle cellulitis.  2. 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  3. Atrial fibrillation, on chronic anticoagulation.  4. History of left jugular vein thrombosis.  5. History of coronary artery disease.  6. History of asthma.  7. The history of ventricular tachycardia and status post ICD      placement.  8. History of gastroesophageal reflux disease.  9. History of ischemic cardiomyopathy.  10.History of hyperlipidemia.  11.History of vertigo  12.History of sigmoid diverticulosis.  13.Hypertension.   PROCEDURES AND STUDIES:  1. CT scan of the left ankle - nonspecific diffuse soft tissue      swelling.  No specific sign of septic arthritis or osteomyelitis,      fluid in the flexor hallucis tendon sheath, commonly seen and not      specifically suggestive of an infected collection, though infection      not excluded.  2. Arthrocentesis of left ankle, on April 29, 2007 per interventional      radiology - fluid cultures negative.  So far negative.  3. X-ray of right knee - degenerative osteoarthritic changes, no lytic      lesions, joint effusion present.  Suspicion for small loose bodies.   CONSULTATIONS:  Dr. [name].   BRIEF HISTORY:  The patient is a pleasant 70 year old male with the  above-listed medical problems who presented with complaints of redness  pain and swelling over his left ankle area for about 10 days.  It was  reported that he had been seen in the ER at the onset, and at that time,  he was started on Keflex and prednisone.  His symptoms seemed  to get  better initially but subsequently worsened.  He saw his primary care  physician at that time, and he was started on clindamycin.  After  starting the clindamycin, it seemed to get better but then again got  worse, and so he came back to the ER and was admitted for further  evaluation and management.    Please see the full admission history and physical dictated on April 26, 2007, by Dr. [name] for the details of the admission physical exam as  well as the laboratory data.   HOSPITAL COURSE:  1. Left lower leg/ankle cellulitis.  Upon admission, the patient was      empirically started on antibiotics for cellulitis and also to cover      for possible septic gastritis.  A CT scan of the ankle was done and      the results as stated above.  Arthrocentesis was also done per      Interventional Radiology and unfortunately, although orders were      written  for cell counts as well as crystal analysis, these were not      done, the cultures were done and the final results showed no      growth.  Fungal and AFB cultures were also sent and so far no      growth.  The patient's symptoms gradually improved during his      hospital stay.  The erythema is mostly resolved at this time.  He      still has some residual edema around the left ankle but that is      significantly improved.  As is noted on the CT scan, there is fluid      in the flexor hallucis tendon sheath.  I consulted Dr. [name] while      the patient was in the hospital and he recommended to discharge the      patient on oral antibiotics and to have him follow up at his      office.  Physical therapy was consulted, the patient was in the      hospital and he has been able to ambulate with a walker, his pain      is much improved as well.  He is discharged home on anti-      inflammatories as well as the antibiotics.  The patient's      leukocytosis has resolved, his last white cell count prior to      discharge 10.3.  He has  remained afebrile and hemodynamically      stable.  2. Right knee effusion and osteoarthritic changes.  While the patient      was in the hospital, he developed pain and swelling in the right      knee.  In talking with him.  He stated that he had had surgery in      that knee a long time ago and that over the years he would have      intermittent pain and swelling in the knee and has seen Dr. [name] in      the past for that.  I obtained a uric acid level which was within      normal limits.  An x-ray of that knee was also done and the results      as stated above.  I consulted Dr. [name] as noted above, and he      recommended placing him on anti-inflammatories, and once he was      improved to discharge him home and to follow up with him      outpatient.  As already mentioned above, physical therapy was      consulted and followed him in the hospital.  They have recommended      a rolling walker, and he will be discharged with this and is to      follow up with orthopedics.  3. Atrial fibrillation.  The patient was maintained on his outpatient      medications and also Coumadin.  His INR is therapeutic today at      2.6, and he is to follow-up at Dr. [name] office for a PT/INR on      May 07, 2007, and the Coumadin dose to be adjusted as appropriate      following that.  4. History of jugula vein thrombosis.  As above on chronic Coumadin.  5. History of asthma.  The patient is to continue his outpatient      medications upon discharge.  6.  Gastroesophageal reflux disease.  The patient is to continue proton      pump inhibitor.  7. History of coronary artery disease/ischemic cardiomyopathy.  He is      status post coronary artery bypass graft, he was maintained on his      outpatient medications while in the hospital and is to continue      them upon discharge.   DISCHARGE MEDICATIONS:  1. Doxycycline 100 mg one p.o. b.i.d. for seven more days.  2. Coumadin as directed.  3. Patient  to continue preadmission medications:  Lanoxin, Coreg,      Prinivil, Zocor, aspirin, Protonix, Symbicort, Zyrtec,      spironolactone, Singulair, finasteride.  4. Naprosyn 500 mg one p.o. b.i.d. p.r.n.   FOLLOW-UP CARE:  1. Dr. [name].  Patient to call for appointment for this week upon      discharge.  2. Dr. Tiburcio Pea in 1-2 weeks.  Call for appointment.   DISCHARGE CONDITION:  Improved, stable.      [name], M.D.  Electronically Signed     ACV/MEDQ  D:  05/05/2007  T:  05/05/2007  Job:  [id number]   cc:   Holley Bouche, M.D.  Fax: [phone]   Madlyn Frankel. [name], M.D.  Fax: [phone]

## 2010-06-13 NOTE — Assessment & Plan Note (Signed)
[facility]                         ELECTROPHYSIOLOGY OFFICE NOTE   NAME:Leisinger, MOHID FURUYA                       MRN:          [id number]  DATE:02/05/2007                            DOB:          11-30-1940    Mr. Voit comes in without complaints of chest pain or shortness of  breath.  His medications include Coreg 25 b.i.d., Lanoxin 0.125,  Prinivil 20, Coumadin, simvastatin, Protonix, Symbicort, spironolactone  and Allegra.   On examination his blood pressure is 105/61, his pulse is 53.  His lungs  were clear.  Heart sounds were regular.  Extremities were without edema.   Interrogation of his Guidant ICD demonstrates an R wave of 25 with  impedance of 952 with threshold of 0.3 at 0.4 with battery voltage 2.57.   IMPRESSION:  1. Ventricular tachycardia.  2. Ischemic cardiomyopathy.  3. Status post ICD per the above.   PLAN:  Mr. Campillo is stable.  We will see him again in 1 year's time and  he will follow remotely in the interim.     Duke Salvia, MD, [facility]  Electronically Signed    SCK/MedQ  DD: 02/05/2007  DT: 02/05/2007  Job #: [id number]   cc:   Nicki Guadalajara, M.D.

## 2010-06-13 NOTE — Telephone Encounter (Signed)
Pt would like some samples of Nexium until his mail order capsules come in.  Dr [name], pt wants to know what supplements you suggest for Nexium use- calcium, etc?

## 2010-06-13 NOTE — Assessment & Plan Note (Signed)
[facility]                         GASTROENTEROLOGY OFFICE NOTE   NAME:Krakow, [name]                       MRN:          [id number]  DATE:08/16/2006                            DOB:          1940-03-06    [name] is an elderly white male with multiple cardiovascular  problems, including coronary artery bypass surgery with significant left  ventricular dysfunction, atrial fibrillation with prior embolic  phenomenon, and recurrent arrhythmias requiring defibrillator placement  and chronic anticoagulation with Coumadin and aspirin.  He is followed  by both Dr. Tresa Endo and Dr. Graciela Husbands.   I have seen [name] for many years because of functional dyspepsia but  negative endoscopic exams.  He has responded well to chronic proton pump  inhibitory therapy with Protonix 40 mg a day.  Previous biopsies for  Helicobacter pylori have been negative as has abdominal ultrasonography.   Patient has not been felt to be a candidate for colonoscopy, and he does  not have lower GI complaints.  He has had negative Hemoccults.  Barium  enema was performed several years ago and was unremarkable.   He is on multiple medications listed and reviewed in his chart.   He is without GI complaints at this time.   PHYSICAL EXAMINATION:  He weighs 154 pounds.  Blood pressure is 102/62.  Pulse was 58 and regular.  General physical exam was not performed.   RECOMMENDATIONS:  1. Check Hemoccult cards.  2. Patient should have virtual colonoscopy once this is paid for by      [name].  I therefore will continue to see him on a      yearly basis.  3. Continue chronic PPI therapy for his chronic dyspepsia and chronic      anticoagulation.  4. Continue medical followup with Dr. Tiburcio Pea, as previously planned.     Vania Rea. [name], MD, [facility], FAGA  Electronically Signed    DRP/MedQ  DD: 08/16/2006  DT: 08/16/2006  Job #: [id number]   cc:   Melida Quitter, M.D.  Jason Salvia, MD, [facility]  Nicki Guadalajara, M.D.

## 2010-06-13 NOTE — Discharge Summary (Signed)
[name] NO.:  [id number]   MEDICAL RECORD NO.:  [id number]          PATIENT TYPE:  INP   LOCATION:  2899                         FACILITY:  [facility]   PHYSICIAN:  Duke Salvia, MD, FACCDATE OF BIRTH:  Jun 28, 1940   DATE OF ADMISSION:  02/09/2008  DATE OF DISCHARGE:  02/09/2008                               DISCHARGE SUMMARY   FINAL DIAGNOSES:  1. Cardioverter-defibrillator at elective replacement indicator.  2. Generator change with implantation of a Medtronic Virtuoso single-      chamber cardioverter-defibrillator, implanted by Dr. Graciela Husbands on      February 09, 2008, for a device which has indicated replacement.   SECONDARY DIAGNOSES:  1. History of implantable cardioverter-defibrillator implant in June      2002 for inducible monomorphic ventricular tachycardia at      electrophysiology study.  2. History of myocardial infarction.  3. History of three-vessel coronary artery disease, status post      coronary artery bypass graft surgery.  4. Recent Myoview study shows no ischemia.  5. Ischemic cardiomyopathy.  6. 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  7. Dyslipidemia.  8. Paroxysmal atrial fibrillation, on Coumadin therapy.  9. Asthma.  10.Gout.   This patient has allergies to PENICILLIN and AMOXICILLIN.   Once again ,procedure February 09, 2008, explantation of existing  cardioverter-defibrillator which is at elective replacement indicator  and implant of a Medtronic VIRTUOSO single-chamber cardioverter-  defibrillator by Dr. Sherryl Manges.  The patient had no post-procedural  complications.  No hematoma at the pocket site, and the patient will be  discharging the same day on February 09, 2008.  He is asked to remove the  bandage on the morning of Tuesday, February 10, 2008, and leave the  incision open to the air.  He is to keep his incision dry for the next 7  days and to sponge bathe until Monday,  February 16, 2008.  He resumes his  medications as he has been taking and there is no change.  Sheet is  attached to his discharge summary.   MEDICATIONS:  1. Coreg 25 mg twice daily.  2. Lanoxin 0.125 mg daily.  3. Prinivil 20 mg tablets one to one-half tabs daily.  4. Coumadin as directed by the [facility].  5. Simvastatin 40 mg daily at bedtime.  6. Enteric-coated aspirin 81 mg daily.  7. Protonix 40 mg daily.  8. Symbicort 80/4.5 inhalations twice daily.  9. Fish oil caps 1200 mg twice daily.  10.Allegra 180 mg daily.  11.Singulair 10 mg daily.  12.Colchicine 0.6 mg daily.  13.Finasteride 5 mg daily.  14.Imdur 30 mg tablets one-half tablet daily.  15.Spironolactone 25 mg tablets one-half tablet daily.   The patient follows up with [facility] on Monday,  February 23, 2008, at 9 o'clock.   LABORATORY STUDIES THIS ADMISSION:  Complete blood count, white cells  11.6, hemoglobin 15.4, hematocrit 44.5, and platelets of 234.  Sodium  138, potassium 4.3, chloride 104, carbonate 29, BUN 12, creatinine 0.9,  and glucose 75.      Maple Mirza, [place]      Duke Salvia, MD, [facility]  Electronically Signed    GM/MEDQ  D:  02/09/2008  T:  02/10/2008  Job:  [id number]   cc:   Kerry Kass, M.D. [facility]  Duke Salvia, MD, [facility]  Nicki Guadalajara, M.D.  Dr. Tiburcio Pea

## 2010-06-13 NOTE — Op Note (Signed)
NAMEDELYNN, PURSLEY NO.:  [id number]   MEDICAL RECORD NO.:  [id number]          PATIENT TYPE:  INP   LOCATION:  2899                         FACILITY:  [facility]   PHYSICIAN:  Duke Salvia, MD, FACCDATE OF BIRTH:  05/10/1940   DATE OF PROCEDURE:  02/09/2008  DATE OF DISCHARGE:  02/09/2008                               OPERATIVE REPORT   PREOPERATIVE DIAGNOSIS:  Ischemic cardiomyopathy with previously  implanted implantable cardioverter defibrillator, now at end-of-life.   POSTOPERATIVE DIAGNOSIS:  Ischemic cardiomyopathy with previously  implanted implantable cardioverter defibrillator, now at end-of-life.   PROCEDURE:  Explantation of a previously implanted device and  implantation of new device with intraoperative defibrillation threshold  testing and pocket revision.   PROCEDURE IN DETAIL:  Following obtaining informed consent, the patient  was prepped for device explantation.  After the above, lidocaine was  infiltrated along the line of previous incision and was carried to the  layer of device pocket which was opened up with sharp and blunt  dissection.  The device was exposed and explanted.  The pocket then had  to be revised to hold the different footprint of the new device and this  was accomplished.  Hemostasis was obtained.   Interrogation of the previously implanted [employer] 0147 lead, serial  [id number] demonstrated an R-wave of greater than 30 with a pacing  impedance of 1077 ohms, a threshold 0.6 volts at 0.5 milliseconds,  current of threshold was 0.6 mA and there was no diaphragmatic pacing at  10 volts.  This lead was then secured to a Medtronic Virtuoso ICD, model  D154VWC, serial #[id number] and through the device the R-wave was 70  with a pace impedance of 920 ohms, a threshold of 1 volt at 0.5  milliseconds.   Defibrillation threshold testing was then undertaken.  Ventricular  fibrillation was induced via the T-wave shock.  After a total  duration  of 8 seconds, a 20-joule shock was delivered through a measured  resistance of 52 ohms terminating ventricular fibrillation and restoring  sinus rhythm.  The device was implanted and the pocket was copiously  irrigated with antibiotic containing saline solution.  Hemostasis was  assured.  The leads and the pulse generator were placed in the pocket  and secured to the prepectoral fascia.  Wound was closed in 3 layers in  normal fashion.  The wound was washed, dried, and a benzoin and Steri-  Strip dressing was applied.  Needle counts, sponge counts, and  instrument counts were correct at the end of the procedure according to  staff.  The patient tolerated the procedure without apparent  complication.      Duke Salvia, MD, [facility]  Electronically Signed    SCK/MEDQ  D:  04/01/2008  T:  04/02/2008  Job:  [id number]   cc:   Nicki Guadalajara, M.D.

## 2010-06-13 NOTE — Telephone Encounter (Signed)
Notified pt Dr [name] suggests he take Caltrate 600 with Vit D 2 tabs daily. Also informed him I left Nexium samples at the front door; pt verbalized understanding.

## 2010-06-13 NOTE — Telephone Encounter (Signed)
Caltrate 600 with vitamin D 2 tablets a day

## 2010-06-13 NOTE — Letter (Signed)
January 16, 2008    Jason Guadalajara, MD  [address]  [place], [place] [address]   RE:  Jason, [name]  MRN:  [id number]  /  DOB:  06-Nov-1940   Dear [name],   [name] comes in today in followup for his ICD that was implanted some  time ago for inducible VT, nonsustained VT, and  ischemic  cardiomyopathy.  He has had subsequent appropriate therapy for  ventricular tachycardia.   He is doing quite well without complaints of chest pain or shortness of  breath.  He did undergo Myoview scan in your office the other day and I  showed no ischemia, but depressed LV function.   CURRENT MEDICATIONS:  1. Coreg 25 b.i.d.  2. Lanoxin 0.125.  3. Prinivil 30.  4. Coumadin 5 alternate with 2.5.  5. Simvastatin 40.  6. Aspirin.  7. Symbicort.  8. Protonix.  9. Spironolactone 12.5.  10.Singulair.  11.Finasteride 5.   PHYSICAL EXAMINATION:  VITAL SIGNS:  Blood pressure today was 114/57  with pulse of 52.  His weight was 160, which is stable.  NECK:  Veins were flat.  His carotids were brisk and full.  LUNGS:  Clear.  HEART:  Sounds were regular without murmurs or gallops.  ABDOMEN:  Soft with active bowel sounds without midline pulsation or  hepatomegaly.  EXTREMITIES:  Femoral pulses were 2+, distal pulses were intact.  There  was no clubbing, cyanosis, or edema.  NEUROLOGICAL:  Grossly normal.   Interrogation of his Guidant device demonstrated that he had reached  ERI.  His impedances in his single chamber defibrillator was 904, which  is stable.  High-voltage impedance was 51 ohms.   IMPRESSION:  1. Nonsustained ventricular tachycardia with inducible sustained      ventricular tachycardia.  2. Ischemic cardiomyopathy with prior myocardial infarction and no      ischemia on recent Myoview.  3. Status post implantable cardioverter-defibrillator for the above      with intercurrent appropriate therapy, now defibrillation response      interval.   [name] defibrillator,  [name] has reached ERI.  We have discussed  device generator replacement with the potential risks including, but not  limited to infection, bleed, malfunction.  He understands these risks  and he would like to proceed.   Thank you very much for having done the Myoview scan suggesting that  there was no ischemia.  His EF has been surprisingly stable.    Sincerely,      Jason Salvia, MD, [facility]  Electronically Signed    [name]/MedQ  DD: 01/16/2008  DT: 01/17/2008  Job #: [id number]

## 2010-06-13 NOTE — Assessment & Plan Note (Signed)
[facility]                         ELECTROPHYSIOLOGY OFFICE NOTE   NAME:[name], [name]                       MRN:          [id number]  DATE:12/17/2007                            DOB:          12-05-1940    [name] comes in today as an add-on because of palpitations.  He has a  history of progressive paroxysmal atrial fibrillation as well as PVCs.  These occurred in short bursts.   He  is under a fair amount of stress.  His brother [name] has had  implantation of a cerebral pacemaker for Parkinson.  He has had no  intercurrent VT.   His current medications include Coreg 25 b.i.d., Lanoxin 0.125, Prinivil  20, Coumadin, simvastatin 40, aspirin, Protonix, Symbicort,  spironolactone, fexofenadine, and colchicine.   On examination, his blood pressure was 108/64, his pulse was 53, his  weight was 163, which was up 9 pounds since July 2008.  (His diet is non-  meat).  His lungs were clear.  Heart sounds were regular.  Neck veins  were flat.  The abdomen was soft.  The extremities were without edema.   Electrocardiogram demonstrated sinus rhythm of 52 with intervals of  0.17+/0.14/0.43.  Axis was leftward -70.   Prolonged monitoring identified a PVC that corresponded with his  symptoms.  I had also seen these on threshold testing.   IMPRESSION:  1. Ischemic cardiomyopathy with depressed left ventricular function.      Prior reperfusion.  2. Status post implantable cardioverter-defibrillator for primary      prevention with subsequent intercurrent appropriate therapy for      ventricular tachycardia.  3. Paroxysmal atrial fibrillation - quiescent.  4. Symptomatic premature ventricular contractions.  5. Defibrillator approaching elective replacement indicator.   [name] is having symptomatic PVCs.  We will check a potassium and  magnesium and forward these results to Dr. Tresa Endo.  I anticipate that he  will reach ERI sometime in  December.  He would  like to have  defibrillator changed out in January.  We will plan to see him again the  first week in January and schedule at that time.     Duke Salvia, MD, [facility]  Electronically Signed    SCK/MedQ  DD: 12/17/2007  DT: 12/18/2007  Job #: [id number]   cc:   Nicki Guadalajara, M.D.

## 2010-06-13 NOTE — Assessment & Plan Note (Signed)
[name]                         ELECTROPHYSIOLOGY OFFICE NOTE   NAME:Hickle, JARREN PARA                       MRN:          [id number]  DATE:05/04/2008                            DOB:          1941/01/26    Mr. [name] is seen in followup for ventricular tachycardia in the setting  of ischemic heart disease.  He is status post ICD implantation with  recent defibrillator generator replacement.  He has noted some overlying  erythema.  This is gradually resolving.   MEDICATION LIST:  Legion and includes;  1. Coreg 25 b.i.d.  2. Lanoxin 0.125.  3. Prinivil 30.  4. Coumadin.  5. Simvastatin.  6. Aspirin.  7. Protonix.  8. Symbicort.  9. Spironolactone 12.5.  10.Fexofenadine.   PHYSICAL EXAMINATION:  VITAL SIGNS:  His blood pressure was 102/64, the  pulse is 64, and his weight was 162.  NECK:  Veins were flat.  LUNGS:  Clear.  HEART:  Sounds were regular without murmurs or gallops.  ABDOMEN:  Soft.  EXTREMITIES:  No edema.   Interrogation of his ICD demonstrated a Medtronic Virtuoso with an R-  wave of 18, impedance of 1008 ohms, a threshold of 1 volt at 0.4.  Battery voltage is 3.21.  There is no intercurrent therapies.  There are  2 intercurrent episodes of nonsustained ventricular tachycardia.   IMPRESSION:  1. Ischemic heart disease.  2. Ventricular tachycardia - stable.  3. Status post implantable cardioverter-defibrillator for the above.   Mr. [name] is doing quite well.  He will continue on his current  medication.  I am going to give him a prescription for doxycycline  because of the overlying erythema, although I do not think there is any  significant infection.   We will see him again in 3 months' time.     Duke Salvia, MD, [facility]  Electronically Signed    SCK/MedQ  DD: 05/04/2008  DT: 05/05/2008  Job #: [id number]   cc:   Nicki Guadalajara, M.D.

## 2010-06-16 NOTE — Op Note (Signed)
[place]. [facility]  Patient:    [name], [name]                         MRN: [id number] Attending:  Nathen May, M.D., [facility] CC:         Electrophysiology laboratory  Lennette Bihari, M.D.  [place] office   Operative Report  PREOPERATIVE DIAGNOSIS:  Inducible ventricular tachycardia.  POSTOPERATIVE DIAGNOSIS:  Inducible ventricular tachycardia.  OPERATION:  Implantation of single chamber defibrillator, intraoperative defibrillation threshold testing, and contrast cineangiography.  SURGEON:  Nathen May, M.D.  DESCRIPTION OF PROCEDURE:  After obtaining informed consent and following induction of sustained monomorphic ventricular tachycardia, the patient was submitted for defibrillator implantation in conjunction with anesthesia support.  After routine prep and drape of the left upper chest, intravenous contrast was injected via the left antecubital vein to identify the course and the patency of the extrathoracic left subclavian vein.  This having been accomplished, the lidocaine was infiltrated in the prepectoral subclavicular region.  An incision was made and carried down to the layer of the prepectoral fascia using electrocautery.  A pocket was formed using electrocautery.  Hemostasis was obtained.  Our attention was turned to gaining access to the extrathoracic left subclavian vein which was accomplished without difficulty without the aspiration of air or puncturing of the artery.  The guide wire was placed through the cannula and also arterial sheath was placed in a figure-of-eight allowed to hang loosely.  Subsequently, a 9 French tear-away introducer sheath was placed through which was passed a guidant 0747 dual-coil defibrillator lead under fluoroscopic guidance.  It was admitted into the right ventricular apex where the bipolar R-wave was 10 mV with a pacing impedance of 1160 ohms and a pacing threshold of 0.5 msec  and 0.5 volts to the current threshold of 0.3 msec and a diaphragmatic pacing of 10 volts.  Please note that the serial number of this lead was [id number].  The lead was then attached to a guidant 1860 single chamber defibrillator, serial number [id number].  Through this device, bipolar R-wave was 23 mV with a pacing impedance of 1207 ohms and a pacing threshold of 0.5 msec at 0.6 volts.  With these acceptable parameters, the lead was secured to the prepectoral fascia.  The pocket was closed with an irrigating antibiotic containing saline solution.  The leads of the pulse generator were then placed in the pocket and defibrillation threshold testing was undertaken.  Ventricular fibrillation was induced via the T-wave shock.  After a total duration of 0.8 seconds, a 14 joule shock was delivered through a measured resistance up to 44 ohms to relive ventricular fibrillation and restore in sinus rhythm.  This was accomplished at lead sensitivity.  The ventricular fibrillation was then reinduced via the T-wave shock, but after a total duration of 8 seconds, a 14 joule shock was delivered through a measured resistance of 44 ohms to remain in ventricular fibrillation, restoring sinus rhythm.  This was accomplished at nominal sensitivity.  Hemostasis was again insured.  This having been accomplished, the system was implanted.  The pocket was copiously irrigated with antibiotic-containing saline solution.  Hemostasis was insured and the pocket was closed in three layers in a normal fashion.  Following the securing of the device to the pocket, the wound was washed and dried and benzoin and Steri-Strips were then applied.  Needle counts, sponge counts, and instrument counts were correct at the end of  the procedure.DD:  06/28/00 TD:  06/29/00 Job: [id number]

## 2010-06-16 NOTE — Assessment & Plan Note (Signed)
[facility]                         ELECTROPHYSIOLOGY OFFICE NOTE   NAME:Ganesh, ZAYYAN MULLEN                       MRN:          [id number]  DATE:01/24/2006                            DOB:          11/14/1940    Mr. Coach is seen today.  I am not quite sure who asked [name] to see him  quickly but there is a concern about swelling in his left neck.  He  underwent a variety of scans of his neck and it is felt that he had  jugular venous thrombosis that was acute with some lymphatic dilatation.   As noted, this was relatively acute.  The patient has been on  longstanding Coumadin for paroxysmal atrial fibrillation.  He has not  had ipsilateral upper extremity swelling.   His other medications include:  1. Lanoxin 0.125.  2. Prinivil 20.  3. Zocor 40.  4. Zyrtec.  5. Protonix 40.  6. Coreg 80.  7. Imdur.   EXAMINATION:  His blood pressure is 120/71.  Pulse is 63.  LUNGS:  Clear.  Heart sounds were regular.  There was diltation of the left neck.  The external jugular drained.  The internal jugular did not appear significantly distended.  There are  superficial veins, however, that are distended over his left neck.   Interrogation of his defibrillator demonstrates a R-wave of 25 with an  impendence of 98 and a threshold of 0.6 at 0.5.  The battery status was  MOL2 with a voltage of 2.58.  He is now 6-1/2 years post implant.   IMPRESSION:  1. Remotely implanted implantable cardioverter defibrillator for      inducible ventricular tachycardia in the setting of ischemic heart      disease.  2. Congestive heart failure - compensated.  3. Paroxysmal atrial fibrillation on Coumadin.  4. Newly diagnosed jugular venous thrombosis.   I am concerned about the cause of the thrombosis.  While it is most  likely a propagation of a clot medially from a clot around the lead  itself, I have not seen this, he is on Coumadin, and the dates are as  noted prolonged at  6-1/2 years.   I will need to review the scans with radiology and see if there is a way  to try and clarify from an imaging modality the potential mechanism.  No  extrinsic mass was identified.  Nothing else was noted in the neck  though.   In any case, the therapy would be ongoing anticoagulation with his INR  being in-between 2 and 3 and concomitant aspirin.  I will see him again  in 3 weeks or 4 weeks as he approaches his ERI of his device.     Duke Salvia, MD, [facility]  Electronically Signed    SCK/MedQ  DD: 01/24/2006  DT: 01/24/2006  Job #: [id number]

## 2010-06-16 NOTE — Assessment & Plan Note (Signed)
[facility]                           ELECTROPHYSIOLOGY OFFICE NOTE   NAME:Giroux, HAJI DELAINE                       MRN:          [id number]  DATE:10/09/2005                            DOB:          [birth year]    HISTORY OF PRESENT ILLNESS:  Mr. Bozzo comes in today. He continues to do  quite well. He has no complaints of chest pain or shortness of breath.   MEDICAL DECISION MAKING:  Are reviewed and are unchanged.   PHYSICAL EXAMINATION:  VITAL SIGNS:  Blood pressure 109/68. His pulse was  62.  LUNGS:  Clear.  HEART:  Sounds were regular.  EXTREMITIES:  Without edema.   PROCEDURE:  Interrogation of his Guidant [id number] defibrillator demonstrates a  battery voltage of 2.59, which appears to have about 25% __________ . In  fact, this was implanted 7 years ago. The R wave was 22.4 with impedence of  920 and a threshold of 0.6 and 0.4 with a high voltage impedence of 46 OHMS.   IMPRESSION:  1. Ischemic cardiomyopathy.  2. Status post ICD for the above.  3. Congestive heart failure - compensated.  4. Paroxysmal atrial fibrillation.   PLAN:  Mr. Ledford is doing quite well. We will see him again in 4 months  time.                                   Duke Salvia, MD, [facility]   SCK/MedQ  DD:  10/09/2005  DT:  10/10/2005  Job #:  [id number]   cc:   Nicki Guadalajara, M.D.

## 2010-06-16 NOTE — Discharge Summary (Signed)
[name]. [facility]  Patient:    [name], [name]                       MRN: [id number] Adm. Date:  [id number] Attending:  Nathen May Dictator:   Tereso Newcomer, P.A.                  Referring Physician Discharge [place]  ADDENDUM TO DICTATION [phone]  DATE OF BIRTH:  October 09, 1943  Please note that the patients AICD is a Guidant system. DD:  09/05/99 TD:  09/05/99 Job: [id number]

## 2010-06-16 NOTE — Assessment & Plan Note (Signed)
[facility]                         ELECTROPHYSIOLOGY OFFICE NOTE   NAME:Borges, CASIMIRO LIENHARD                       MRN:          [id number]  DATE:02/21/2006                            DOB:          1940-06-07    Mr. Hing comes in today. He remains concerned about the venous  thrombosis of his jugular vein. The images did not show extrinsic  compression and so the alternative explanation would have to be  propagation of a clot within his subclavian vein.  He is developing  superficial collaterals. Dr. Tresa Endo has increased his Coumadin target  from 2 to 3 to 2.5 to 3.5 and he takes concomitant aspirin at 81 mg  daily.   ON EXAMINATION TODAY: His blood pressure was 104/64, his pulse was 57.  LUNGS: Clear.  CARDIAC: Heart sounds were regular.  NECK: His neck is a little bit swollen above his clavicle.  EXTREMITIES: Were without edema.   INTERROGATION OF HIS GUIDANT PRISM ICD:  Demonstrates battery voltage of  2.58 with an estimated longevity of another year or so. R wave was  20,000 beats of 888, threshold of 26.4.   Will plan to see Mr. Delashmit in about 1 year's time. He will be begun on  latitude.   I will continue to try to see what I can find about jugular venous  thrombosis and ______devices.     Duke Salvia, MD, [facility]  Electronically Signed    SCK/MedQ  DD: 02/21/2006  DT: 02/21/2006  Job #: [id number]   cc:   Nicki Guadalajara, M.D.

## 2010-06-16 NOTE — Discharge Summary (Signed)
[place]. [facility]  Patient:    [name], [name]                       MRN: [id number] Adm. Date:  [id number] Disc. Date: 09/05/99 Attending:  Nathen May Dictator:   Tereso Newcomer, P.A. CC:         Lennette Bihari, M.D.             Jonelle Sports. Cheryll Cockayne, M.D.                  Referring Physician Discharge [place]  DATE OF BIRTH:  [birth year].  PROCEDURES PERFORMED THIS ADMISSION:  Electrophysiology study with AICD placement secondary to inducible ventricular tachycardia.  DISCHARGE DIAGNOSES 1. Ischemic cardiomyopathy, ejection fraction of 20%, with class 1 to 2    symptoms. 2. Coronary artery disease, status post myocardial infarction. 3. Status post coronary artery bypass grafting. 4. Asthma. 5. Hyperlipidemia. 6. Vertigo.  ADMISSION HISTORY:  [name] was originally seen by Dr. Nathen May in the office on August 30, 1999 for nonsustained ventricular tachycardia in the setting of ischemic heart disease and prior bypass surgery.  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  He had done really quite well and had no problems with exercise intolerance.  He denies any syncope, palpitations, nocturnal dyspnea or pedal edema.  He did note chronic two-pillow orthopnea.  He has had significant up-titration of his LV dysfunction medications.  PHYSICAL EXAMINATION:  Initial physical exam revealed a middle- to older-age Caucasian male in no acute distress.  Blood pressure low at 96/52 and pulse 64.  Neck veins flat and carotids brisk and full bilaterally without bruits. PMI quite displaced.  S1 was diminished.  Early systolic ejection murmur. Back without kyphosis or scoliosis.  Abdomen soft with active bowel sounds. Extremities without clubbing, cyanosis, or edema.  LABORATORY AND X-RAY FINDINGS:  EKG demonstrated sinus rhythm  with a rate of 6 with intervals of 0.15/0.10/0.40 and axis of 123 degrees.  Extensive loss of Q waves noted, reflecting a large anterior infarct.  Patient had recently had a Cardiolite scan performed by his cardiologist, Dr. Lennette Bihari, that demonstrated no significant ischemia but a dense scar and an EF of 21%.  Initial labs revealed an INR of 1.6.  The patient is on Coumadin and this had been held prior to his procedure.  Long discussions were made with patient and his wife on the date of initial evaluation.  The potential consideration for ______ and MADIT II were discussed.  The patients and his wife were quite interested in considering this.  HOSPITAL COURSE:  The patient was admitted to [facility] on September 04, 1999 for EP study and possible AICD placement.  The patient had the procedure and had no immediate complications.  As noted above, the patient did have inducible ventricular tachycardia and AICD was placed by Dr. Graciela Husbands.  The patient remained stable after the procedure.  His chest ______ .  On the morning of September 05, 1999, his wound site looked good with only slight ecchymosis and no hematoma.  It was decided that the patient was stable enough for discharge to home with followup with Dr. Graciela Husbands in two weeks and Dr. Tresa Endo in four weeks.  DISCHARGE MEDICATIONS 1. Coreg 25 mg b.i.d. 2. Lanoxin 0.125 mg q.d. 3. Prinivil  20 mg q.d. 4. Zocor 20 mg q.h.s. 5. Coated aspirin 81 mg q.d.; this was to be restarted on Wednesday,    September 06, 1999. 6. Coumadin as before, to be restarted on Friday, September 08, 1999. 7. Nitroglycerin 0.4 mg sublingual p.r.n. chest pain.  ACTIVITY:  No driving for one week.  No heavy lifting or exertion with left arm for one month.  DIET:  Low-fat, low-cholesterol, low-sodium diet.  WOUND CARE:  The patient should notify Dr. Graciela Husbands if he notices any drainage, swelling or redness.  FOLLOWUP:  Followup is with Dr. Graciela Husbands in two weeks on  September 22, 1999 at 10:30 a.m. in [place]; the patient will be seeing a Advice worker. Follow up with Dr. Tresa Endo in [place] on October 03, 1999 at 2:50 p.m. DD:  09/05/99 TD:  09/05/99 Job: [id number]

## 2010-06-27 ENCOUNTER — Telehealth: Payer: Self-pay | Admitting: Gastroenterology

## 2010-06-27 MED ORDER — DICYCLOMINE HCL 10 MG PO CAPS: ORAL_CAPSULE | ORAL | Status: DC

## 2010-06-27 NOTE — Telephone Encounter (Signed)
Pt was started on Dicyclomine 10/22/08 by Willette Cluster, NP. Dr Sherryl Manges removed the drug from pt's medlist 02/01/09.  Phoned the pt to see if he was having problems and needed to be seen. Pt stated he keeps the Bentyl on hand to have when he goes out of town. He has some old capsules, but they are out of date.

## 2010-08-15 ENCOUNTER — Other Ambulatory Visit: Payer: Self-pay | Admitting: Family Medicine

## 2010-08-15 DIAGNOSIS — N62 Hypertrophy of breast: Secondary | ICD-10-CM

## 2010-08-18 ENCOUNTER — Ambulatory Visit
Admission: RE | Admit: 2010-08-18 | Discharge: 2010-08-18 | Disposition: A | Discharge status: Home / Self Care | Payer: Medicare Other | Source: Ambulatory Visit | Attending: Family Medicine | Admitting: Family Medicine

## 2010-08-18 DIAGNOSIS — N62 Hypertrophy of breast: Secondary | ICD-10-CM

## 2010-09-14 ENCOUNTER — Encounter: Payer: Medicare Other | Admitting: Internal Medicine

## 2010-09-26 ENCOUNTER — Ambulatory Visit (INDEPENDENT_AMBULATORY_CARE_PROVIDER_SITE_OTHER): Payer: Medicare Other | Admitting: Internal Medicine

## 2010-09-26 ENCOUNTER — Encounter: Payer: Self-pay | Admitting: Internal Medicine

## 2010-09-26 DIAGNOSIS — I5022 Chronic systolic (congestive) heart failure: Secondary | ICD-10-CM

## 2010-09-26 DIAGNOSIS — I4891 Unspecified atrial fibrillation: Secondary | ICD-10-CM

## 2010-09-26 DIAGNOSIS — I472 Ventricular tachycardia, unspecified: Secondary | ICD-10-CM

## 2010-09-26 DIAGNOSIS — I2510 Atherosclerotic heart disease of native coronary artery without angina pectoris: Secondary | ICD-10-CM

## 2010-09-26 DIAGNOSIS — IMO0002 Reserved for concepts with insufficient information to code with codable children: Secondary | ICD-10-CM

## 2010-09-26 DIAGNOSIS — I2589 Other forms of chronic ischemic heart disease: Secondary | ICD-10-CM

## 2010-09-26 DIAGNOSIS — I4892 Unspecified atrial flutter: Secondary | ICD-10-CM

## 2010-09-26 DIAGNOSIS — Z95810 Presence of automatic (implantable) cardiac defibrillator: Secondary | ICD-10-CM

## 2010-09-26 NOTE — Assessment & Plan Note (Addendum)
Stable ; optivol crossing in June. This is unassociated with symptoms were rather sinus surgery. We'll follow

## 2010-09-26 NOTE — Assessment & Plan Note (Signed)
The patient's device was interrogated.  The information was reviewed. No changes were made in the programming.    

## 2010-09-26 NOTE — Assessment & Plan Note (Signed)
No intercurrent VT 

## 2010-09-26 NOTE — Assessment & Plan Note (Signed)
Stable on current medications. I recommended to him that he discontinue his aspirin he will review this with Dr. Tresa Endo

## 2010-09-26 NOTE — Patient Instructions (Signed)
Your physician wants you to follow-up in: 1 year with Dr. Klein. You will receive a reminder letter in the mail two months in advance. If you don't receive a letter, please call our office to schedule the follow-up appointment.  Your physician recommends that you continue on your current medications as directed. Please refer to the Current Medication list given to you today.  

## 2010-09-26 NOTE — Progress Notes (Signed)
  HPI  [name] is a 70 y.o. male seen in  followup for ICD implanted for primary prevention in the setting of ischemic heart disease. He has had no intercurrent discharges. there has been no shortness of breath. There has been no peripheral edema. and no chest pain.   He was seen in the emergency room in May with rapid AF. He converted to sinus rhythm. He knows that his heart rate at that time was about 100. He is currently not on antiarrhythmic drug therapy. He is on warfarin  his inhaler was changed to [place] and there has been a marked improvement in the frequency of his spells.    No past medical history on file.  No past surgical history on file.  Current Outpatient Prescriptions  Medication Sig Dispense Refill  . allopurinol (ZYLOPRIM) 300 MG tablet Take 150 mg by mouth Daily.      Marland Kitchen aspirin 81 MG tablet Take 81 mg by mouth daily.        . carvedilol (COREG) 25 MG tablet Take 25 mg by mouth 2 (two) times daily with a meal.        . ciclesonide (ALVESCO) 80 MCG/ACT inhaler Inhale 2 puffs into the lungs 2 (two) times daily.        . ciclesonide (OMNARIS) 50 MCG/ACT nasal spray Place 2 sprays into both nostrils 2 (two) times daily.        . digoxin (LANOXIN) 0.125 MG tablet Take 125 mcg by mouth daily.        Marland Kitchen esomeprazole (NEXIUM) 40 MG capsule Take 1 capsule (40 mg total) by mouth daily.  15 capsule  0  . finasteride (PROSCAR) 5 MG tablet Take 5 mg by mouth daily.        . isosorbide dinitrate (ISORDIL) 30 MG tablet Take 30 mg by mouth daily. Taking 1/2 tablet daily       . levocetirizine (XYZAL) 5 MG tablet Take 5 mg by mouth daily.        Marland Kitchen lisinopril (PRINIVIL) 20 MG tablet Take 20 mg by mouth daily.        . montelukast (SINGULAIR) 10 MG tablet Take 10 mg by mouth daily.        Marland Kitchen NITROSTAT 0.4 MG SL tablet as needed.      . simvastatin (ZOCOR) 40 MG tablet Take 40 mg by mouth daily.        Marland Kitchen spironolactone (ALDACTONE) 25 MG tablet Take 25 mg by mouth daily. Taking 1/2  tablet daily       . warfarin (COUMADIN) 5 MG tablet Take 5 mg by mouth daily.          Allergies  Allergen Reactions  . Amoxicillin     REACTION: unspecified  . Penicillins     REACTION: unspecified    Review of Systems negative except from HPI and PMH  Physical Exam Well developed and well nourished in no acute distress HENT normal E scleral and icterus clear Neck Supple JVP flat; carotids brisk and full Clear to ausculation Regular rate and rhythm, no murmurs gallops or rub Soft with active bowel sounds No clubbing cyanosis and edema Alert and oriented, grossly normal motor and sensory function Skin Warm and Dry      Assessment and  Plan

## 2010-09-26 NOTE — Progress Notes (Deleted)
  HPI  [name] is a 70 y.o. male Seen in followup for ICD implanted for pri  No past medical history on file.  No past surgical history on file.  Current Outpatient Prescriptions  Medication Sig Dispense Refill  . allopurinol (ZYLOPRIM) 300 MG tablet Take 150 mg by mouth Daily.      Marland Kitchen aspirin 81 MG tablet Take 81 mg by mouth daily.        . carvedilol (COREG) 25 MG tablet Take 25 mg by mouth 2 (two) times daily with a meal.        . ciclesonide (ALVESCO) 80 MCG/ACT inhaler Inhale 2 puffs into the lungs 2 (two) times daily.        . ciclesonide (OMNARIS) 50 MCG/ACT nasal spray Place 2 sprays into both nostrils 2 (two) times daily.        . digoxin (LANOXIN) 0.125 MG tablet Take 125 mcg by mouth daily.        Marland Kitchen esomeprazole (NEXIUM) 40 MG capsule Take 1 capsule (40 mg total) by mouth daily.  15 capsule  0  . finasteride (PROSCAR) 5 MG tablet Take 5 mg by mouth daily.        . isosorbide dinitrate (ISORDIL) 30 MG tablet Take 30 mg by mouth daily. Taking 1/2 tablet daily       . levocetirizine (XYZAL) 5 MG tablet Take 5 mg by mouth daily.        Marland Kitchen lisinopril (PRINIVIL) 20 MG tablet Take 20 mg by mouth daily.        . montelukast (SINGULAIR) 10 MG tablet Take 10 mg by mouth daily.        Marland Kitchen NITROSTAT 0.4 MG SL tablet as needed.      . simvastatin (ZOCOR) 40 MG tablet Take 40 mg by mouth daily.        Marland Kitchen spironolactone (ALDACTONE) 25 MG tablet Take 25 mg by mouth daily. Taking 1/2 tablet daily       . warfarin (COUMADIN) 5 MG tablet Take 5 mg by mouth daily.          Allergies  Allergen Reactions  . Amoxicillin     REACTION: unspecified  . Penicillins     REACTION: unspecified    Review of Systems negative except from HPI and PMH  Physical Exam Well developed and well nourished in no acute distress HENT normal E scleral and icterus clear Neck Supple JVP flat; carotids brisk and full Clear to ausculation Regular rate and rhythm, no murmurs gallops or rub Soft with active  bowel sounds No clubbing cyanosis and edema Alert and oriented, grossly normal motor and sensory function Skin Warm and Dry  ECG  Assessment and  Plan

## 2010-10-12 ENCOUNTER — Encounter: Payer: Self-pay | Admitting: Internal Medicine

## 2010-10-12 ENCOUNTER — Telehealth: Payer: Self-pay | Admitting: Internal Medicine

## 2010-10-12 ENCOUNTER — Other Ambulatory Visit: Payer: Self-pay | Admitting: Internal Medicine

## 2010-10-12 ENCOUNTER — Ambulatory Visit (INDEPENDENT_AMBULATORY_CARE_PROVIDER_SITE_OTHER): Payer: Medicare Other | Admitting: *Deleted

## 2010-10-12 DIAGNOSIS — I472 Ventricular tachycardia: Secondary | ICD-10-CM

## 2010-10-12 DIAGNOSIS — I428 Other cardiomyopathies: Secondary | ICD-10-CM

## 2010-10-15 LAB — REMOTE ICD DEVICE
BATTERY VOLTAGE: 3.14 V
CHARGE TIME: 9.369 s
DEV-0020ICD: NEGATIVE
RV LEAD AMPLITUDE: 17.3 mv
TOT-0001: 1
TOT-0002: 0
TOT-0006: 20100111000000
TZAT-0005SLOWVT: 88 pct
TZAT-0005SLOWVT: 91 pct
TZAT-0011FASTVT: 10 ms
TZAT-0011SLOWVT: 10 ms
TZAT-0011SLOWVT: 10 ms
TZAT-0012FASTVT: 200 ms
TZAT-0012SLOWVT: 200 ms
TZAT-0012SLOWVT: 200 ms
TZAT-0013FASTVT: 1
TZAT-0013SLOWVT: 2
TZAT-0013SLOWVT: 2
TZAT-0018SLOWVT: NEGATIVE
TZAT-0018SLOWVT: NEGATIVE
TZAT-0019FASTVT: 8 V
TZAT-0019SLOWVT: 8 V
TZAT-0019SLOWVT: 8 V
TZAT-0020FASTVT: 1.5 ms
TZON-0003FASTVT: 240 ms
TZON-0004SLOWVT: 32
TZON-0005SLOWVT: 12
TZST-0001FASTVT: 3
TZST-0001FASTVT: 4
TZST-0001SLOWVT: 4
TZST-0001SLOWVT: 6
TZST-0003FASTVT: 35 J
TZST-0003FASTVT: 35 J
TZST-0003SLOWVT: 25 J
TZST-0003SLOWVT: 35 J
TZST-0003SLOWVT: 35 J
VENTRICULAR PACING ICD: 0.01 pct

## 2010-10-18 ENCOUNTER — Encounter: Payer: Self-pay | Admitting: *Deleted

## 2010-10-23 LAB — COMPREHENSIVE METABOLIC PANEL
ALT: 17
Albumin: 2.8 — ABNORMAL LOW
Alkaline Phosphatase: 30 — ABNORMAL LOW
Alkaline Phosphatase: 33 — ABNORMAL LOW
BUN: 14
BUN: 9
CO2: 23
Chloride: 102
Creatinine, Ser: 0.98
Creatinine, Ser: 1.05
GFR calc Af Amer: >60
Glucose, Bld: 102 — ABNORMAL HIGH
Glucose, Bld: 125 — ABNORMAL HIGH
Potassium: 3.6
Potassium: 3.8
Potassium: 4
Sodium: 133 — ABNORMAL LOW
Total Bilirubin: 1.1
Total Protein: 5.4 — ABNORMAL LOW
Total Protein: 5.8 — ABNORMAL LOW
Total Protein: 6.1

## 2010-10-23 LAB — DIFFERENTIAL
Basophils Absolute: 0
Basophils Absolute: 0
Basophils Relative: 0
Basophils Relative: 1
Eosinophils Absolute: 0.1
Eosinophils Relative: 1
Lymphocytes Relative: 10 — ABNORMAL LOW
Lymphocytes Relative: 20
Lymphocytes Relative: 22
Lymphs Abs: 2.5
Monocytes Relative: 15 — ABNORMAL HIGH
Monocytes Relative: 13 — ABNORMAL HIGH
Neutro Abs: 14.2 — ABNORMAL HIGH
Neutro Abs: 7.5
Neutro Abs: 7.8 — ABNORMAL HIGH
Neutrophils Relative %: 61
Neutrophils Relative %: 62
Neutrophils Relative %: 76
Neutrophils Relative %: 70

## 2010-10-23 LAB — PROTIME-INR
INR: 1.5
INR: 2.5 — ABNORMAL HIGH
Prothrombin Time: 18.1 — ABNORMAL HIGH
Prothrombin Time: 26.1 — ABNORMAL HIGH

## 2010-10-23 LAB — BODY FLUID CULTURE

## 2010-10-23 LAB — CBC
HCT: 42.6
HCT: 43.5
HCT: 44.9
Hemoglobin: 13.9
Hemoglobin: 14.7
MCHC: 33
MCV: 86
MCV: 86.6
MCV: 87.5
Platelets: 328
Platelets: 341
Platelets: 352
RBC: 4.6
RDW: 13.3
RDW: 13.7
RDW: 13.8
WBC: 12.6 — ABNORMAL HIGH

## 2010-10-23 LAB — BASIC METABOLIC PANEL
BUN: 8
Calcium: 8.7
Creatinine, Ser: 0.84
GFR calc non Af Amer: >60
Glucose, Bld: 91
Sodium: 138

## 2010-10-23 LAB — AFB CULTURE WITH SMEAR (NOT AT ARMC): Acid Fast Smear: NONE SEEN

## 2010-10-23 LAB — PHOSPHORUS
Phosphorus: 2.2 — ABNORMAL LOW
Phosphorus: 2.4

## 2010-10-23 LAB — CULTURE, BLOOD (ROUTINE X 2)
Culture: NO GROWTH
Culture: NO GROWTH

## 2010-10-23 LAB — FUNGUS CULTURE W SMEAR: Fungal Smear: NONE SEEN

## 2010-10-24 LAB — CBC
HCT: 37.4 — ABNORMAL LOW
Hemoglobin: 13.6
MCHC: 34.5
MCHC: 35.1
MCV: 86.2
MCV: 86.6
Platelets: 310
Platelets: 338
Platelets: 345
Platelets: 346
Platelets: 361
RBC: 4.44
RBC: 4.7
RDW: 13.7
RDW: 13.8
WBC: 10.3
WBC: 16.8 — ABNORMAL HIGH
WBC: 18.8 — ABNORMAL HIGH

## 2010-10-24 LAB — BASIC METABOLIC PANEL
BUN: 10
Calcium: 8.6
Creatinine, Ser: 0.76
GFR calc Af Amer: >60
GFR calc non Af Amer: >60
GFR calc non Af Amer: >60
Glucose, Bld: 105 — ABNORMAL HIGH
Glucose, Bld: 97
Potassium: 3.9
Sodium: 136

## 2010-10-24 LAB — PROTIME-INR
INR: 1.3
INR: 2 — ABNORMAL HIGH
INR: 2.8 — ABNORMAL HIGH
Prothrombin Time: 16.4 — ABNORMAL HIGH
Prothrombin Time: 17.9 — ABNORMAL HIGH
Prothrombin Time: 23.4 — ABNORMAL HIGH
Prothrombin Time: 30.4 — ABNORMAL HIGH

## 2010-10-24 LAB — URINE MICROSCOPIC-ADD ON

## 2010-10-24 LAB — DIFFERENTIAL
Basophils Relative: 1
Eosinophils Absolute: 0.1
Eosinophils Absolute: 0.2
Lymphocytes Relative: 9 — ABNORMAL LOW
Lymphs Abs: 1.6
Neutro Abs: 14.4 — ABNORMAL HIGH
Neutrophils Relative %: 68
Neutrophils Relative %: 77

## 2010-10-24 LAB — URINALYSIS, ROUTINE W REFLEX MICROSCOPIC
Bilirubin Urine: NEGATIVE
Leukocytes, UA: NEGATIVE
Nitrite: NEGATIVE
Specific Gravity, Urine: 1.016
Urobilinogen, UA: 0.2

## 2010-10-24 LAB — VANCOMYCIN, TROUGH: Vancomycin Tr: 13.7

## 2010-10-24 LAB — URIC ACID: Uric Acid, Serum: 4.1

## 2010-10-27 NOTE — Progress Notes (Signed)
ICM/ICD checked by remote

## 2010-11-03 ENCOUNTER — Ambulatory Visit (INDEPENDENT_AMBULATORY_CARE_PROVIDER_SITE_OTHER): Payer: Medicare Other | Admitting: Gastroenterology

## 2010-11-03 ENCOUNTER — Encounter: Payer: Self-pay | Admitting: Gastroenterology

## 2010-11-03 VITALS — BP 108/72 | HR 58 | Ht 67.0 in | Wt 149.0 lb

## 2010-11-03 DIAGNOSIS — K219 Gastro-esophageal reflux disease without esophagitis: Secondary | ICD-10-CM

## 2010-11-03 NOTE — Patient Instructions (Signed)
Follow up as needed

## 2010-11-03 NOTE — Progress Notes (Signed)
This is a 70 year old Caucasian male with chronic acid reflux doing well daily Nexium therapy. He has periodic left lower quadrant discomfort but otherwise denies GI complaints. He does have a past history of diverticulosis. He has not had previous colonoscopy because of chronic anticoagulation, previous MIs, chronic ventricular arrhythmias implanted defibrillator, etc. currently he denies otherwise any GI complaints, specifically melena or hematochezia, irregular bowel habits, dysphagia, or hepatojugular complaints.   Current Medications, Allergies, Past Medical History, Past Surgical History, Family History and Social History were reviewed in [employer] record.  Pertinent Review of Systems Negative,,, labs from primary care reviewed and showed normal CBC and metabolic profile including liver function tests.   Physical Exam: Healthy-appearing in no acute distress. Abdominal exam is entirely benign without organomegaly, masses or tenderness. Bowel sounds are normal.    Assessment and Plan: Hemoccult cards were done in February and were guaiac negative. CBC is normal. The patient still does question whether or not he should have colonoscopy exam. We had arranged CT colonoscopy in May, but this was not completed by the patient. He is high risk for colonoscopy, I would check with Dr. Nicki Guadalajara his cardiologist as to his opinion as to proceeding with colonoscopy. If scheduled, the patient would need to be off of Coumadin for 5 days previous to the procedure. He is to continue the high fiber diet as tolerated and daily Nexium. Advised to continue all his other medications as listed and reviewed in his record.  Please copy her primary care physician, referring physician, and pertinent subspecialists. Encounter Diagnosis  Name Primary?  . GERD (gastroesophageal reflux disease) Yes

## 2010-11-07 ENCOUNTER — Encounter: Payer: Medicare Other | Admitting: Internal Medicine

## 2010-12-18 ENCOUNTER — Telehealth: Payer: Self-pay | Admitting: Internal Medicine

## 2010-12-18 NOTE — Telephone Encounter (Signed)
I spoke with the patient. He states that he has been having skipped beats/ delayed beats for about a week. He denies dizziness, but does report some SOB. He would like to be seen for evaluation of his symptoms. He will see Norma Fredrickson, NP tomorrow at 10:30am.

## 2010-12-18 NOTE — Telephone Encounter (Signed)
Pt calling c/o that heart has been skipping for 4-5 days maybe one week and pt wants to know if he can be seen today or tomorrow either with Dr. Graciela Husbands or Tereso Newcomer. Pt said he also has to take a couple of deep breaths to breathe normally.  Please return pt call to discuss further.

## 2010-12-19 ENCOUNTER — Encounter: Payer: Self-pay | Admitting: Nurse Practitioner

## 2010-12-19 ENCOUNTER — Ambulatory Visit (INDEPENDENT_AMBULATORY_CARE_PROVIDER_SITE_OTHER): Payer: Medicare Other | Admitting: Nurse Practitioner

## 2010-12-19 VITALS — BP 96/80 | HR 51 | Ht 67.0 in | Wt 151.8 lb

## 2010-12-19 DIAGNOSIS — R002 Palpitations: Secondary | ICD-10-CM

## 2010-12-19 NOTE — Assessment & Plan Note (Addendum)
I have discussed his case with Dr. Graciela Husbands. This sounds like PVC's. We will get his device interrogated. If ok, we will defer management back to his primary cardiologist. He may cut the Rapaflow back to just a half of a tablet, but needs to see urology for follow up. Patient is agreeable to this plan and will call if any problems develop in the interim.

## 2010-12-19 NOTE — Progress Notes (Signed)
[name] Date of Birth: 1940-10-12 Medical Record #[id number]  History of Present Illness: [name] is seen today for a work in visit. He is seen for Dr. Graciela Husbands, who only follows his ICD. Dr. Daphene Jaeger is actually his primary cardiologist. He is complaining of new onset of palpitations. Has been going on for about one week. He notices that his heart "skips". He is not symptomatic with this but is concerned. Does not use caffeine. No real alcohol use. No ICD shocks. He says he has seen Dr. Tresa Endo recently and had lab work. No chest pain or shortness of breath. No dizziness. He remains on Coreg. He would like for his heart rate to be a little faster, but I suspect this is due to his beta blocker effect.   Current Outpatient Prescriptions on File Prior to Visit  Medication Sig Dispense Refill  . allopurinol (ZYLOPRIM) 300 MG tablet Take 150 mg by mouth Daily.      Marland Kitchen aspirin 81 MG tablet Take 81 mg by mouth daily. Every other day       . carvedilol (COREG) 25 MG tablet Take 25 mg by mouth 2 (two) times daily with a meal.        . ciclesonide (ALVESCO) 80 MCG/ACT inhaler Inhale 2 puffs into the lungs 2 (two) times daily.        . ciclesonide (OMNARIS) 50 MCG/ACT nasal spray Place 2 sprays into both nostrils 2 (two) times daily.        . digoxin (LANOXIN) 0.125 MG tablet Take 125 mcg by mouth daily.        Marland Kitchen esomeprazole (NEXIUM) 40 MG capsule Take 1 capsule (40 mg total) by mouth daily.  15 capsule  0  . isosorbide dinitrate (ISORDIL) 30 MG tablet Take 30 mg by mouth daily. Taking 1/2 tablet daily      . lisinopril (PRINIVIL) 20 MG tablet Take 20 mg by mouth daily.        . montelukast (SINGULAIR) 10 MG tablet Take 10 mg by mouth daily.        Marland Kitchen NITROSTAT 0.4 MG SL tablet as needed.      . simvastatin (ZOCOR) 40 MG tablet Take 40 mg by mouth daily.        Marland Kitchen spironolactone (ALDACTONE) 25 MG tablet Take 25 mg by mouth daily. Taking 1/2 tablet daily       . warfarin (COUMADIN) 5 MG tablet Take  5 mg by mouth daily.          Allergies  Allergen Reactions  . Amoxicillin     REACTION: unspecified  . Penicillins     REACTION: unspecified    Past Medical History  Diagnosis Date  . Coronary artery disease   . Automatic implantable cardiac defibrillator in situ     medtronic  . Atrial fib/flutter, transient     Past Surgical History  Procedure Date  . Nasal sinus surgery 05/31/2010    Dr. Suszanne Conners    History  Smoking status  . Never Smoker   Smokeless tobacco  . Never Used    History  Alcohol Use  . Yes    socially    History reviewed. No pertinent family history.  Review of Systems: The review of systems is per the HPI. No dizziness. No syncope. No shocks reported.  Has started Rapaflow for BPH symptoms. He has just increased the dose. This may correlate with the onset of his skips. All other systems were reviewed  and are negative.  Physical Exam: BP 96/80  Pulse 51  Ht 5\' 7"  (1.702 m)  Wt 151 lb 12.8 oz (68.856 kg)  BMI 23.78 kg/m2 Patient is very pleasant and in no acute distress. Skin is warm and dry. Color is normal.  HEENT is unremarkable. Normocephalic/atraumatic. PERRL. Sclera are nonicteric. Neck is supple. No masses. No JVD. Lungs are clear. Cardiac exam shows a regular rate and rhythm. Abdomen is soft. Extremities are without edema. Gait and ROM are intact. No gross neurologic deficits noted.   LABORATORY DATA: EKG today shows sinus bradycardia with prior anterolateral MI. No arrhythmia noted.   ICD check today shows a one second run of NSVT. He has had some PVC's which equate to about one a minute. Overall the device check is fine.   Assessment / Plan:

## 2010-12-19 NOTE — Patient Instructions (Addendum)
Stay on your current medicines.  Your device looks ok.  Follow up with Dr. Tresa Endo.   You may cut the Rapaflow in half but follow up with your urologist.

## 2010-12-29 ENCOUNTER — Telehealth: Payer: Self-pay | Admitting: Internal Medicine

## 2010-12-29 NOTE — Telephone Encounter (Signed)
All Cardiac faxed to [facility] @ [phone]   12/29/10/km

## 2011-01-10 ENCOUNTER — Encounter: Payer: Self-pay | Admitting: Internal Medicine

## 2011-01-10 ENCOUNTER — Other Ambulatory Visit: Payer: Self-pay | Admitting: Internal Medicine

## 2011-01-11 ENCOUNTER — Ambulatory Visit (INDEPENDENT_AMBULATORY_CARE_PROVIDER_SITE_OTHER): Payer: Medicare Other | Admitting: *Deleted

## 2011-01-11 DIAGNOSIS — I2589 Other forms of chronic ischemic heart disease: Secondary | ICD-10-CM

## 2011-01-11 DIAGNOSIS — I5022 Chronic systolic (congestive) heart failure: Secondary | ICD-10-CM

## 2011-01-14 LAB — REMOTE ICD DEVICE
BRDY-0002RV: 40 {beats}/min
RV LEAD IMPEDENCE ICD: 1072 Ohm
TOT-0002: 0
TOT-0006: 20100111000000
TZAT-0001SLOWVT: 1
TZAT-0001SLOWVT: 2
TZAT-0011FASTVT: 10 ms
TZAT-0012SLOWVT: 200 ms
TZAT-0012SLOWVT: 200 ms
TZAT-0013FASTVT: 1
TZAT-0013SLOWVT: 2
TZAT-0013SLOWVT: 2
TZAT-0018FASTVT: NEGATIVE
TZAT-0018SLOWVT: NEGATIVE
TZAT-0018SLOWVT: NEGATIVE
TZAT-0019FASTVT: 8 V
TZAT-0020SLOWVT: 1.5 ms
TZAT-0020SLOWVT: 1.5 ms
TZON-0003FASTVT: 240 ms
TZON-0003SLOWVT: 340 ms
TZON-0004SLOWVT: 32
TZON-0004VSLOWVT: 20
TZON-0005SLOWVT: 12
TZST-0001FASTVT: 3
TZST-0001FASTVT: 6
TZST-0001SLOWVT: 3
TZST-0001SLOWVT: 6
TZST-0003FASTVT: 35 J
TZST-0003FASTVT: 35 J
TZST-0003FASTVT: 35 J
TZST-0003SLOWVT: 25 J
TZST-0003SLOWVT: 35 J

## 2011-01-16 NOTE — Progress Notes (Signed)
ICD remote with ICM 

## 2011-01-31 DIAGNOSIS — J309 Allergic rhinitis, unspecified: Secondary | ICD-10-CM | POA: Diagnosis not present

## 2011-02-01 DIAGNOSIS — IMO0002 Reserved for concepts with insufficient information to code with codable children: Secondary | ICD-10-CM | POA: Diagnosis not present

## 2011-02-01 DIAGNOSIS — H269 Unspecified cataract: Secondary | ICD-10-CM | POA: Diagnosis not present

## 2011-02-01 DIAGNOSIS — H2510 Age-related nuclear cataract, unspecified eye: Secondary | ICD-10-CM | POA: Diagnosis not present

## 2011-02-02 ENCOUNTER — Encounter: Payer: Self-pay | Admitting: *Deleted

## 2011-02-05 DIAGNOSIS — M109 Gout, unspecified: Secondary | ICD-10-CM | POA: Diagnosis not present

## 2011-02-05 DIAGNOSIS — Z79899 Other long term (current) drug therapy: Secondary | ICD-10-CM | POA: Diagnosis not present

## 2011-02-07 DIAGNOSIS — J309 Allergic rhinitis, unspecified: Secondary | ICD-10-CM | POA: Diagnosis not present

## 2011-02-12 DIAGNOSIS — J320 Chronic maxillary sinusitis: Secondary | ICD-10-CM | POA: Diagnosis not present

## 2011-02-12 DIAGNOSIS — J309 Allergic rhinitis, unspecified: Secondary | ICD-10-CM | POA: Diagnosis not present

## 2011-02-12 DIAGNOSIS — J330 Polyp of nasal cavity: Secondary | ICD-10-CM | POA: Diagnosis not present

## 2011-02-12 DIAGNOSIS — J322 Chronic ethmoidal sinusitis: Secondary | ICD-10-CM | POA: Diagnosis not present

## 2011-02-22 DIAGNOSIS — J309 Allergic rhinitis, unspecified: Secondary | ICD-10-CM | POA: Diagnosis not present

## 2011-02-26 DIAGNOSIS — J309 Allergic rhinitis, unspecified: Secondary | ICD-10-CM | POA: Diagnosis not present

## 2011-03-02 DIAGNOSIS — I4891 Unspecified atrial fibrillation: Secondary | ICD-10-CM | POA: Diagnosis not present

## 2011-03-02 DIAGNOSIS — Z7901 Long term (current) use of anticoagulants: Secondary | ICD-10-CM | POA: Diagnosis not present

## 2011-03-06 DIAGNOSIS — J301 Allergic rhinitis due to pollen: Secondary | ICD-10-CM | POA: Diagnosis not present

## 2011-03-06 DIAGNOSIS — J45909 Unspecified asthma, uncomplicated: Secondary | ICD-10-CM | POA: Diagnosis not present

## 2011-03-06 DIAGNOSIS — J3089 Other allergic rhinitis: Secondary | ICD-10-CM | POA: Diagnosis not present

## 2011-03-13 DIAGNOSIS — J309 Allergic rhinitis, unspecified: Secondary | ICD-10-CM | POA: Diagnosis not present

## 2011-03-14 DIAGNOSIS — J309 Allergic rhinitis, unspecified: Secondary | ICD-10-CM | POA: Diagnosis not present

## 2011-03-21 DIAGNOSIS — M109 Gout, unspecified: Secondary | ICD-10-CM | POA: Diagnosis not present

## 2011-03-21 DIAGNOSIS — M25579 Pain in unspecified ankle and joints of unspecified foot: Secondary | ICD-10-CM | POA: Diagnosis not present

## 2011-03-21 DIAGNOSIS — Z79899 Other long term (current) drug therapy: Secondary | ICD-10-CM | POA: Diagnosis not present

## 2011-03-21 DIAGNOSIS — J309 Allergic rhinitis, unspecified: Secondary | ICD-10-CM | POA: Diagnosis not present

## 2011-03-22 DIAGNOSIS — M79609 Pain in unspecified limb: Secondary | ICD-10-CM | POA: Diagnosis not present

## 2011-03-22 DIAGNOSIS — G5760 Lesion of plantar nerve, unspecified lower limb: Secondary | ICD-10-CM | POA: Diagnosis not present

## 2011-03-29 DIAGNOSIS — J309 Allergic rhinitis, unspecified: Secondary | ICD-10-CM | POA: Diagnosis not present

## 2011-03-30 DIAGNOSIS — Z7901 Long term (current) use of anticoagulants: Secondary | ICD-10-CM | POA: Diagnosis not present

## 2011-03-30 DIAGNOSIS — I4891 Unspecified atrial fibrillation: Secondary | ICD-10-CM | POA: Diagnosis not present

## 2011-04-02 DIAGNOSIS — J309 Allergic rhinitis, unspecified: Secondary | ICD-10-CM | POA: Diagnosis not present

## 2011-04-07 ENCOUNTER — Ambulatory Visit (INDEPENDENT_AMBULATORY_CARE_PROVIDER_SITE_OTHER): Payer: Medicare Other | Admitting: Family Medicine

## 2011-04-07 VITALS — BP 109/62 | HR 55 | Temp 98.0°F | Resp 16 | Ht 67.0 in | Wt 156.0 lb

## 2011-04-07 DIAGNOSIS — M62838 Other muscle spasm: Secondary | ICD-10-CM | POA: Diagnosis not present

## 2011-04-07 DIAGNOSIS — M542 Cervicalgia: Secondary | ICD-10-CM

## 2011-04-07 MED ORDER — CYCLOBENZAPRINE HCL 5 MG PO TABS: 5.0000 mg | ORAL_TABLET | Freq: Three times a day (TID) | ORAL | Status: AC | PRN

## 2011-04-07 NOTE — Progress Notes (Signed)
  Subjective:    Patient ID: [name], male    DOB: 07-Jul-1940, 71 y.o.   MRN: [id number]  HPI 71 yo male with multiple medical problems including a fib, CAD, allergies, asthma, IBS here with neck and back pain for 5 days. Started working out at [employer], [employer], and woke up next day with neck and upper back pain.  Hard to turn head.  Difficult to sleep due to pain and discomfort. Tried 2 ibuprofen, and a tylenol.  Neither much help.  Heat feels good but effects don't last.     Review of Systems Negative except as per HPI     Objective:   Physical Exam  Constitutional: He appears well-developed.  Pulmonary/Chest: Effort normal.  Neurological: He is alert.   Limited ROM in neck due to pain/spasm.  Mild TTP bilateral trapezius with spasm.  No bony c-spine tenderness.        Assessment & Plan:  Neck pain - flexeril 5 - can take 1/2 if makes too drowsy.  Heating pad, gentle stretching, tylenol.  RTC or call INB in 5-7 days.

## 2011-04-09 DIAGNOSIS — S139XXA Sprain of joints and ligaments of unspecified parts of neck, initial encounter: Secondary | ICD-10-CM | POA: Diagnosis not present

## 2011-04-09 DIAGNOSIS — M542 Cervicalgia: Secondary | ICD-10-CM | POA: Diagnosis not present

## 2011-04-10 DIAGNOSIS — J309 Allergic rhinitis, unspecified: Secondary | ICD-10-CM | POA: Diagnosis not present

## 2011-04-11 ENCOUNTER — Ambulatory Visit: Payer: Medicare Other | Attending: Family Medicine | Admitting: Physical Therapy

## 2011-04-11 DIAGNOSIS — M2560 Stiffness of unspecified joint, not elsewhere classified: Secondary | ICD-10-CM | POA: Diagnosis not present

## 2011-04-11 DIAGNOSIS — M6281 Muscle weakness (generalized): Secondary | ICD-10-CM | POA: Diagnosis not present

## 2011-04-11 DIAGNOSIS — M2550 Pain in unspecified joint: Secondary | ICD-10-CM | POA: Diagnosis not present

## 2011-04-11 DIAGNOSIS — IMO0001 Reserved for inherently not codable concepts without codable children: Secondary | ICD-10-CM | POA: Diagnosis not present

## 2011-04-12 DIAGNOSIS — J309 Allergic rhinitis, unspecified: Secondary | ICD-10-CM | POA: Diagnosis not present

## 2011-04-13 ENCOUNTER — Ambulatory Visit: Payer: Medicare Other | Admitting: Physical Therapy

## 2011-04-13 DIAGNOSIS — M2550 Pain in unspecified joint: Secondary | ICD-10-CM | POA: Diagnosis not present

## 2011-04-13 DIAGNOSIS — M2560 Stiffness of unspecified joint, not elsewhere classified: Secondary | ICD-10-CM | POA: Diagnosis not present

## 2011-04-13 DIAGNOSIS — IMO0001 Reserved for inherently not codable concepts without codable children: Secondary | ICD-10-CM | POA: Diagnosis not present

## 2011-04-13 DIAGNOSIS — M6281 Muscle weakness (generalized): Secondary | ICD-10-CM | POA: Diagnosis not present

## 2011-04-16 ENCOUNTER — Ambulatory Visit: Payer: Medicare Other | Admitting: Physical Therapy

## 2011-04-16 DIAGNOSIS — IMO0001 Reserved for inherently not codable concepts without codable children: Secondary | ICD-10-CM | POA: Diagnosis not present

## 2011-04-16 DIAGNOSIS — M2550 Pain in unspecified joint: Secondary | ICD-10-CM | POA: Diagnosis not present

## 2011-04-16 DIAGNOSIS — M2560 Stiffness of unspecified joint, not elsewhere classified: Secondary | ICD-10-CM | POA: Diagnosis not present

## 2011-04-16 DIAGNOSIS — M6281 Muscle weakness (generalized): Secondary | ICD-10-CM | POA: Diagnosis not present

## 2011-04-18 ENCOUNTER — Ambulatory Visit: Payer: Medicare Other

## 2011-04-18 DIAGNOSIS — J309 Allergic rhinitis, unspecified: Secondary | ICD-10-CM | POA: Diagnosis not present

## 2011-04-18 DIAGNOSIS — M2550 Pain in unspecified joint: Secondary | ICD-10-CM | POA: Diagnosis not present

## 2011-04-18 DIAGNOSIS — IMO0001 Reserved for inherently not codable concepts without codable children: Secondary | ICD-10-CM | POA: Diagnosis not present

## 2011-04-18 DIAGNOSIS — M2560 Stiffness of unspecified joint, not elsewhere classified: Secondary | ICD-10-CM | POA: Diagnosis not present

## 2011-04-18 DIAGNOSIS — M6281 Muscle weakness (generalized): Secondary | ICD-10-CM | POA: Diagnosis not present

## 2011-04-19 ENCOUNTER — Ambulatory Visit (INDEPENDENT_AMBULATORY_CARE_PROVIDER_SITE_OTHER): Payer: Medicare Other | Admitting: *Deleted

## 2011-04-19 ENCOUNTER — Encounter: Payer: Self-pay | Admitting: Internal Medicine

## 2011-04-19 DIAGNOSIS — I5022 Chronic systolic (congestive) heart failure: Secondary | ICD-10-CM | POA: Diagnosis not present

## 2011-04-19 DIAGNOSIS — I472 Ventricular tachycardia: Secondary | ICD-10-CM | POA: Diagnosis not present

## 2011-04-23 ENCOUNTER — Ambulatory Visit: Payer: Medicare Other | Admitting: Physical Therapy

## 2011-04-23 DIAGNOSIS — M2560 Stiffness of unspecified joint, not elsewhere classified: Secondary | ICD-10-CM | POA: Diagnosis not present

## 2011-04-23 DIAGNOSIS — M2550 Pain in unspecified joint: Secondary | ICD-10-CM | POA: Diagnosis not present

## 2011-04-23 DIAGNOSIS — IMO0001 Reserved for inherently not codable concepts without codable children: Secondary | ICD-10-CM | POA: Diagnosis not present

## 2011-04-23 DIAGNOSIS — H1130 Conjunctival hemorrhage, unspecified eye: Secondary | ICD-10-CM | POA: Diagnosis not present

## 2011-04-23 DIAGNOSIS — J309 Allergic rhinitis, unspecified: Secondary | ICD-10-CM | POA: Diagnosis not present

## 2011-04-23 DIAGNOSIS — M6281 Muscle weakness (generalized): Secondary | ICD-10-CM | POA: Diagnosis not present

## 2011-04-23 LAB — REMOTE ICD DEVICE
CHARGE TIME: 9.569 s
DEV-0020ICD: NEGATIVE
PACEART VT: 0
RV LEAD AMPLITUDE: 19 mv
TOT-0001: 1
TOT-0002: 0
TOT-0006: 20100111000000
TZAT-0005SLOWVT: 88 pct
TZAT-0005SLOWVT: 91 pct
TZAT-0011FASTVT: 10 ms
TZAT-0011SLOWVT: 10 ms
TZAT-0011SLOWVT: 10 ms
TZAT-0012FASTVT: 200 ms
TZAT-0012SLOWVT: 200 ms
TZAT-0012SLOWVT: 200 ms
TZAT-0013FASTVT: 1
TZAT-0013SLOWVT: 2
TZAT-0013SLOWVT: 2
TZAT-0018SLOWVT: NEGATIVE
TZAT-0018SLOWVT: NEGATIVE
TZAT-0019FASTVT: 8 V
TZON-0003FASTVT: 240 ms
TZON-0004SLOWVT: 32
TZON-0005SLOWVT: 12
TZST-0001FASTVT: 3
TZST-0001FASTVT: 6
TZST-0001SLOWVT: 4
TZST-0001SLOWVT: 6
TZST-0003FASTVT: 35 J
TZST-0003FASTVT: 35 J
TZST-0003FASTVT: 35 J
TZST-0003SLOWVT: 25 J
TZST-0003SLOWVT: 35 J
VENTRICULAR PACING ICD: 0.46 pct

## 2011-04-25 ENCOUNTER — Ambulatory Visit: Payer: Medicare Other

## 2011-04-25 DIAGNOSIS — M2550 Pain in unspecified joint: Secondary | ICD-10-CM | POA: Diagnosis not present

## 2011-04-25 DIAGNOSIS — IMO0001 Reserved for inherently not codable concepts without codable children: Secondary | ICD-10-CM | POA: Diagnosis not present

## 2011-04-25 DIAGNOSIS — I4891 Unspecified atrial fibrillation: Secondary | ICD-10-CM | POA: Diagnosis not present

## 2011-04-25 DIAGNOSIS — M2560 Stiffness of unspecified joint, not elsewhere classified: Secondary | ICD-10-CM | POA: Diagnosis not present

## 2011-04-25 DIAGNOSIS — Z7901 Long term (current) use of anticoagulants: Secondary | ICD-10-CM | POA: Diagnosis not present

## 2011-04-25 DIAGNOSIS — M6281 Muscle weakness (generalized): Secondary | ICD-10-CM | POA: Diagnosis not present

## 2011-04-30 ENCOUNTER — Ambulatory Visit: Payer: Medicare Other | Attending: Family Medicine

## 2011-04-30 DIAGNOSIS — J309 Allergic rhinitis, unspecified: Secondary | ICD-10-CM | POA: Diagnosis not present

## 2011-04-30 DIAGNOSIS — M6281 Muscle weakness (generalized): Secondary | ICD-10-CM | POA: Insufficient documentation

## 2011-04-30 DIAGNOSIS — M2550 Pain in unspecified joint: Secondary | ICD-10-CM | POA: Diagnosis not present

## 2011-04-30 DIAGNOSIS — M2560 Stiffness of unspecified joint, not elsewhere classified: Secondary | ICD-10-CM | POA: Insufficient documentation

## 2011-04-30 DIAGNOSIS — IMO0001 Reserved for inherently not codable concepts without codable children: Secondary | ICD-10-CM | POA: Diagnosis not present

## 2011-05-01 NOTE — Progress Notes (Signed)
ICD remote with ICM 

## 2011-05-02 ENCOUNTER — Ambulatory Visit: Payer: Medicare Other | Admitting: Physical Therapy

## 2011-05-02 DIAGNOSIS — M2560 Stiffness of unspecified joint, not elsewhere classified: Secondary | ICD-10-CM | POA: Diagnosis not present

## 2011-05-02 DIAGNOSIS — M2550 Pain in unspecified joint: Secondary | ICD-10-CM | POA: Diagnosis not present

## 2011-05-02 DIAGNOSIS — IMO0001 Reserved for inherently not codable concepts without codable children: Secondary | ICD-10-CM | POA: Diagnosis not present

## 2011-05-02 DIAGNOSIS — M6281 Muscle weakness (generalized): Secondary | ICD-10-CM | POA: Diagnosis not present

## 2011-05-04 ENCOUNTER — Ambulatory Visit: Payer: Medicare Other | Admitting: Physical Therapy

## 2011-05-04 ENCOUNTER — Encounter: Payer: Self-pay | Admitting: *Deleted

## 2011-05-04 DIAGNOSIS — M2550 Pain in unspecified joint: Secondary | ICD-10-CM | POA: Diagnosis not present

## 2011-05-04 DIAGNOSIS — M6281 Muscle weakness (generalized): Secondary | ICD-10-CM | POA: Diagnosis not present

## 2011-05-04 DIAGNOSIS — IMO0001 Reserved for inherently not codable concepts without codable children: Secondary | ICD-10-CM | POA: Diagnosis not present

## 2011-05-04 DIAGNOSIS — M2560 Stiffness of unspecified joint, not elsewhere classified: Secondary | ICD-10-CM | POA: Diagnosis not present

## 2011-05-07 ENCOUNTER — Ambulatory Visit: Payer: Medicare Other

## 2011-05-07 DIAGNOSIS — IMO0001 Reserved for inherently not codable concepts without codable children: Secondary | ICD-10-CM | POA: Diagnosis not present

## 2011-05-07 DIAGNOSIS — M6281 Muscle weakness (generalized): Secondary | ICD-10-CM | POA: Diagnosis not present

## 2011-05-07 DIAGNOSIS — M2550 Pain in unspecified joint: Secondary | ICD-10-CM | POA: Diagnosis not present

## 2011-05-07 DIAGNOSIS — J309 Allergic rhinitis, unspecified: Secondary | ICD-10-CM | POA: Diagnosis not present

## 2011-05-07 DIAGNOSIS — M2560 Stiffness of unspecified joint, not elsewhere classified: Secondary | ICD-10-CM | POA: Diagnosis not present

## 2011-05-09 ENCOUNTER — Ambulatory Visit: Payer: Medicare Other | Admitting: Physical Therapy

## 2011-05-09 DIAGNOSIS — M6281 Muscle weakness (generalized): Secondary | ICD-10-CM | POA: Diagnosis not present

## 2011-05-09 DIAGNOSIS — M2550 Pain in unspecified joint: Secondary | ICD-10-CM | POA: Diagnosis not present

## 2011-05-09 DIAGNOSIS — IMO0001 Reserved for inherently not codable concepts without codable children: Secondary | ICD-10-CM | POA: Diagnosis not present

## 2011-05-09 DIAGNOSIS — M2560 Stiffness of unspecified joint, not elsewhere classified: Secondary | ICD-10-CM | POA: Diagnosis not present

## 2011-05-11 ENCOUNTER — Ambulatory Visit: Payer: Medicare Other | Admitting: Physical Therapy

## 2011-05-11 DIAGNOSIS — M2560 Stiffness of unspecified joint, not elsewhere classified: Secondary | ICD-10-CM | POA: Diagnosis not present

## 2011-05-11 DIAGNOSIS — M6281 Muscle weakness (generalized): Secondary | ICD-10-CM | POA: Diagnosis not present

## 2011-05-11 DIAGNOSIS — IMO0001 Reserved for inherently not codable concepts without codable children: Secondary | ICD-10-CM | POA: Diagnosis not present

## 2011-05-11 DIAGNOSIS — M2550 Pain in unspecified joint: Secondary | ICD-10-CM | POA: Diagnosis not present

## 2011-05-14 DIAGNOSIS — Z79899 Other long term (current) drug therapy: Secondary | ICD-10-CM | POA: Diagnosis not present

## 2011-05-14 DIAGNOSIS — M25519 Pain in unspecified shoulder: Secondary | ICD-10-CM | POA: Diagnosis not present

## 2011-05-14 DIAGNOSIS — M542 Cervicalgia: Secondary | ICD-10-CM | POA: Diagnosis not present

## 2011-05-14 DIAGNOSIS — J309 Allergic rhinitis, unspecified: Secondary | ICD-10-CM | POA: Diagnosis not present

## 2011-05-16 ENCOUNTER — Encounter: Payer: Medicare Other | Admitting: Physical Therapy

## 2011-05-18 ENCOUNTER — Ambulatory Visit: Payer: Medicare Other | Admitting: Physical Therapy

## 2011-05-18 DIAGNOSIS — M2550 Pain in unspecified joint: Secondary | ICD-10-CM | POA: Diagnosis not present

## 2011-05-18 DIAGNOSIS — IMO0001 Reserved for inherently not codable concepts without codable children: Secondary | ICD-10-CM | POA: Diagnosis not present

## 2011-05-18 DIAGNOSIS — M2560 Stiffness of unspecified joint, not elsewhere classified: Secondary | ICD-10-CM | POA: Diagnosis not present

## 2011-05-18 DIAGNOSIS — M6281 Muscle weakness (generalized): Secondary | ICD-10-CM | POA: Diagnosis not present

## 2011-05-22 ENCOUNTER — Ambulatory Visit: Payer: Medicare Other | Admitting: Physical Therapy

## 2011-05-22 DIAGNOSIS — M6281 Muscle weakness (generalized): Secondary | ICD-10-CM | POA: Diagnosis not present

## 2011-05-22 DIAGNOSIS — M2550 Pain in unspecified joint: Secondary | ICD-10-CM | POA: Diagnosis not present

## 2011-05-22 DIAGNOSIS — M2560 Stiffness of unspecified joint, not elsewhere classified: Secondary | ICD-10-CM | POA: Diagnosis not present

## 2011-05-22 DIAGNOSIS — J309 Allergic rhinitis, unspecified: Secondary | ICD-10-CM | POA: Diagnosis not present

## 2011-05-22 DIAGNOSIS — IMO0001 Reserved for inherently not codable concepts without codable children: Secondary | ICD-10-CM | POA: Diagnosis not present

## 2011-05-23 DIAGNOSIS — I4891 Unspecified atrial fibrillation: Secondary | ICD-10-CM | POA: Diagnosis not present

## 2011-05-23 DIAGNOSIS — Z7901 Long term (current) use of anticoagulants: Secondary | ICD-10-CM | POA: Diagnosis not present

## 2011-05-24 ENCOUNTER — Ambulatory Visit: Payer: Medicare Other | Admitting: Physical Therapy

## 2011-05-24 DIAGNOSIS — IMO0001 Reserved for inherently not codable concepts without codable children: Secondary | ICD-10-CM | POA: Diagnosis not present

## 2011-05-24 DIAGNOSIS — M2550 Pain in unspecified joint: Secondary | ICD-10-CM | POA: Diagnosis not present

## 2011-05-24 DIAGNOSIS — M6281 Muscle weakness (generalized): Secondary | ICD-10-CM | POA: Diagnosis not present

## 2011-05-24 DIAGNOSIS — M2560 Stiffness of unspecified joint, not elsewhere classified: Secondary | ICD-10-CM | POA: Diagnosis not present

## 2011-05-25 DIAGNOSIS — IMO0002 Reserved for concepts with insufficient information to code with codable children: Secondary | ICD-10-CM | POA: Diagnosis not present

## 2011-05-28 DIAGNOSIS — J309 Allergic rhinitis, unspecified: Secondary | ICD-10-CM | POA: Diagnosis not present

## 2011-05-29 ENCOUNTER — Encounter: Payer: Medicare Other | Admitting: Physical Therapy

## 2011-05-29 DIAGNOSIS — IMO0002 Reserved for concepts with insufficient information to code with codable children: Secondary | ICD-10-CM | POA: Diagnosis not present

## 2011-05-29 DIAGNOSIS — M545 Low back pain: Secondary | ICD-10-CM | POA: Diagnosis not present

## 2011-05-30 DIAGNOSIS — IMO0002 Reserved for concepts with insufficient information to code with codable children: Secondary | ICD-10-CM | POA: Diagnosis not present

## 2011-06-01 ENCOUNTER — Encounter: Payer: Medicare Other | Admitting: Physical Therapy

## 2011-06-01 ENCOUNTER — Ambulatory Visit: Payer: Medicare Other | Attending: Family Medicine | Admitting: Physical Therapy

## 2011-06-01 DIAGNOSIS — M6281 Muscle weakness (generalized): Secondary | ICD-10-CM | POA: Insufficient documentation

## 2011-06-01 DIAGNOSIS — M2560 Stiffness of unspecified joint, not elsewhere classified: Secondary | ICD-10-CM | POA: Diagnosis not present

## 2011-06-01 DIAGNOSIS — IMO0001 Reserved for inherently not codable concepts without codable children: Secondary | ICD-10-CM | POA: Diagnosis not present

## 2011-06-01 DIAGNOSIS — M2550 Pain in unspecified joint: Secondary | ICD-10-CM | POA: Insufficient documentation

## 2011-06-01 DIAGNOSIS — IMO0002 Reserved for concepts with insufficient information to code with codable children: Secondary | ICD-10-CM | POA: Diagnosis not present

## 2011-06-01 DIAGNOSIS — M545 Low back pain: Secondary | ICD-10-CM | POA: Diagnosis not present

## 2011-06-04 DIAGNOSIS — IMO0002 Reserved for concepts with insufficient information to code with codable children: Secondary | ICD-10-CM | POA: Diagnosis not present

## 2011-06-04 DIAGNOSIS — M545 Low back pain: Secondary | ICD-10-CM | POA: Diagnosis not present

## 2011-06-05 ENCOUNTER — Ambulatory Visit: Payer: Medicare Other | Admitting: Physical Therapy

## 2011-06-05 ENCOUNTER — Encounter: Payer: Medicare Other | Admitting: Physical Therapy

## 2011-06-05 DIAGNOSIS — R972 Elevated prostate specific antigen [PSA]: Secondary | ICD-10-CM | POA: Diagnosis not present

## 2011-06-05 DIAGNOSIS — J309 Allergic rhinitis, unspecified: Secondary | ICD-10-CM | POA: Diagnosis not present

## 2011-06-05 DIAGNOSIS — M1990 Unspecified osteoarthritis, unspecified site: Secondary | ICD-10-CM | POA: Diagnosis not present

## 2011-06-05 DIAGNOSIS — N401 Enlarged prostate with lower urinary tract symptoms: Secondary | ICD-10-CM | POA: Diagnosis not present

## 2011-06-05 DIAGNOSIS — N138 Other obstructive and reflux uropathy: Secondary | ICD-10-CM | POA: Diagnosis not present

## 2011-06-06 ENCOUNTER — Ambulatory Visit: Payer: Medicare Other | Admitting: Physical Therapy

## 2011-06-06 DIAGNOSIS — Z7901 Long term (current) use of anticoagulants: Secondary | ICD-10-CM | POA: Diagnosis not present

## 2011-06-06 DIAGNOSIS — I4891 Unspecified atrial fibrillation: Secondary | ICD-10-CM | POA: Diagnosis not present

## 2011-06-07 DIAGNOSIS — IMO0002 Reserved for concepts with insufficient information to code with codable children: Secondary | ICD-10-CM | POA: Diagnosis not present

## 2011-06-07 DIAGNOSIS — M545 Low back pain: Secondary | ICD-10-CM | POA: Diagnosis not present

## 2011-06-08 ENCOUNTER — Encounter: Payer: Medicare Other | Admitting: Physical Therapy

## 2011-06-12 DIAGNOSIS — J309 Allergic rhinitis, unspecified: Secondary | ICD-10-CM | POA: Diagnosis not present

## 2011-06-14 ENCOUNTER — Ambulatory Visit: Payer: Medicare Other | Admitting: Physical Therapy

## 2011-06-18 DIAGNOSIS — J309 Allergic rhinitis, unspecified: Secondary | ICD-10-CM | POA: Diagnosis not present

## 2011-06-19 ENCOUNTER — Ambulatory Visit: Payer: Medicare Other | Admitting: Physical Therapy

## 2011-06-19 DIAGNOSIS — N401 Enlarged prostate with lower urinary tract symptoms: Secondary | ICD-10-CM | POA: Diagnosis not present

## 2011-06-19 DIAGNOSIS — R972 Elevated prostate specific antigen [PSA]: Secondary | ICD-10-CM | POA: Diagnosis not present

## 2011-06-20 ENCOUNTER — Ambulatory Visit: Payer: Medicare Other | Admitting: Physical Therapy

## 2011-06-21 DIAGNOSIS — Z7901 Long term (current) use of anticoagulants: Secondary | ICD-10-CM | POA: Diagnosis not present

## 2011-06-21 DIAGNOSIS — I4891 Unspecified atrial fibrillation: Secondary | ICD-10-CM | POA: Diagnosis not present

## 2011-06-22 ENCOUNTER — Ambulatory Visit: Payer: Medicare Other | Admitting: Physical Therapy

## 2011-06-26 DIAGNOSIS — J309 Allergic rhinitis, unspecified: Secondary | ICD-10-CM | POA: Diagnosis not present

## 2011-06-27 ENCOUNTER — Encounter: Payer: Medicare Other | Admitting: Physical Therapy

## 2011-06-29 ENCOUNTER — Ambulatory Visit: Payer: Medicare Other | Admitting: Physical Therapy

## 2011-07-02 ENCOUNTER — Ambulatory Visit: Payer: Medicare Other | Attending: Family Medicine | Admitting: Physical Therapy

## 2011-07-02 DIAGNOSIS — IMO0001 Reserved for inherently not codable concepts without codable children: Secondary | ICD-10-CM | POA: Diagnosis not present

## 2011-07-02 DIAGNOSIS — M2550 Pain in unspecified joint: Secondary | ICD-10-CM | POA: Insufficient documentation

## 2011-07-02 DIAGNOSIS — J309 Allergic rhinitis, unspecified: Secondary | ICD-10-CM | POA: Diagnosis not present

## 2011-07-02 DIAGNOSIS — M2560 Stiffness of unspecified joint, not elsewhere classified: Secondary | ICD-10-CM | POA: Insufficient documentation

## 2011-07-02 DIAGNOSIS — M6281 Muscle weakness (generalized): Secondary | ICD-10-CM | POA: Insufficient documentation

## 2011-07-03 DIAGNOSIS — I4891 Unspecified atrial fibrillation: Secondary | ICD-10-CM | POA: Diagnosis not present

## 2011-07-03 DIAGNOSIS — I2510 Atherosclerotic heart disease of native coronary artery without angina pectoris: Secondary | ICD-10-CM | POA: Diagnosis not present

## 2011-07-03 DIAGNOSIS — E782 Mixed hyperlipidemia: Secondary | ICD-10-CM | POA: Diagnosis not present

## 2011-07-03 DIAGNOSIS — Z79899 Other long term (current) drug therapy: Secondary | ICD-10-CM | POA: Diagnosis not present

## 2011-07-04 ENCOUNTER — Ambulatory Visit: Payer: Medicare Other | Admitting: Physical Therapy

## 2011-07-06 ENCOUNTER — Ambulatory Visit: Payer: Medicare Other | Admitting: Physical Therapy

## 2011-07-09 ENCOUNTER — Ambulatory Visit: Payer: Medicare Other | Admitting: Physical Therapy

## 2011-07-10 DIAGNOSIS — Z7901 Long term (current) use of anticoagulants: Secondary | ICD-10-CM | POA: Diagnosis not present

## 2011-07-10 DIAGNOSIS — I2510 Atherosclerotic heart disease of native coronary artery without angina pectoris: Secondary | ICD-10-CM | POA: Diagnosis not present

## 2011-07-10 DIAGNOSIS — I4891 Unspecified atrial fibrillation: Secondary | ICD-10-CM | POA: Diagnosis not present

## 2011-07-10 DIAGNOSIS — J309 Allergic rhinitis, unspecified: Secondary | ICD-10-CM | POA: Diagnosis not present

## 2011-07-10 DIAGNOSIS — I2589 Other forms of chronic ischemic heart disease: Secondary | ICD-10-CM | POA: Diagnosis not present

## 2011-07-11 ENCOUNTER — Ambulatory Visit: Payer: Medicare Other | Admitting: Physical Therapy

## 2011-07-13 ENCOUNTER — Ambulatory Visit: Payer: Medicare Other | Admitting: Physical Therapy

## 2011-07-13 DIAGNOSIS — IMO0002 Reserved for concepts with insufficient information to code with codable children: Secondary | ICD-10-CM | POA: Diagnosis not present

## 2011-07-16 ENCOUNTER — Ambulatory Visit: Payer: Medicare Other | Admitting: Physical Therapy

## 2011-07-16 DIAGNOSIS — J309 Allergic rhinitis, unspecified: Secondary | ICD-10-CM | POA: Diagnosis not present

## 2011-07-18 ENCOUNTER — Other Ambulatory Visit: Payer: Self-pay | Admitting: Orthopedic Surgery

## 2011-07-18 DIAGNOSIS — M545 Low back pain: Secondary | ICD-10-CM

## 2011-07-19 ENCOUNTER — Ambulatory Visit: Payer: Medicare Other | Admitting: Physical Therapy

## 2011-07-19 DIAGNOSIS — I4891 Unspecified atrial fibrillation: Secondary | ICD-10-CM | POA: Diagnosis not present

## 2011-07-19 DIAGNOSIS — Z7901 Long term (current) use of anticoagulants: Secondary | ICD-10-CM | POA: Diagnosis not present

## 2011-07-23 ENCOUNTER — Ambulatory Visit: Payer: Medicare Other

## 2011-07-23 DIAGNOSIS — M545 Low back pain: Secondary | ICD-10-CM | POA: Diagnosis not present

## 2011-07-23 DIAGNOSIS — M999 Biomechanical lesion, unspecified: Secondary | ICD-10-CM | POA: Diagnosis not present

## 2011-07-23 DIAGNOSIS — J309 Allergic rhinitis, unspecified: Secondary | ICD-10-CM | POA: Diagnosis not present

## 2011-07-23 DIAGNOSIS — M48061 Spinal stenosis, lumbar region without neurogenic claudication: Secondary | ICD-10-CM | POA: Diagnosis not present

## 2011-07-24 DIAGNOSIS — H2640 Unspecified secondary cataract: Secondary | ICD-10-CM | POA: Diagnosis not present

## 2011-07-24 DIAGNOSIS — Z961 Presence of intraocular lens: Secondary | ICD-10-CM | POA: Diagnosis not present

## 2011-07-25 ENCOUNTER — Encounter: Payer: Medicare Other | Admitting: Physical Therapy

## 2011-07-25 DIAGNOSIS — M545 Low back pain: Secondary | ICD-10-CM | POA: Diagnosis not present

## 2011-07-25 DIAGNOSIS — M999 Biomechanical lesion, unspecified: Secondary | ICD-10-CM | POA: Diagnosis not present

## 2011-07-25 DIAGNOSIS — M48061 Spinal stenosis, lumbar region without neurogenic claudication: Secondary | ICD-10-CM | POA: Diagnosis not present

## 2011-07-26 ENCOUNTER — Encounter: Payer: Medicare Other | Admitting: Physical Therapy

## 2011-07-26 ENCOUNTER — Ambulatory Visit (INDEPENDENT_AMBULATORY_CARE_PROVIDER_SITE_OTHER): Payer: Medicare Other | Admitting: *Deleted

## 2011-07-26 ENCOUNTER — Encounter: Payer: Self-pay | Admitting: Internal Medicine

## 2011-07-26 DIAGNOSIS — I472 Ventricular tachycardia: Secondary | ICD-10-CM | POA: Diagnosis not present

## 2011-07-26 DIAGNOSIS — I4729 Other ventricular tachycardia: Secondary | ICD-10-CM

## 2011-07-26 DIAGNOSIS — I2589 Other forms of chronic ischemic heart disease: Secondary | ICD-10-CM

## 2011-07-26 DIAGNOSIS — I5022 Chronic systolic (congestive) heart failure: Secondary | ICD-10-CM | POA: Diagnosis not present

## 2011-07-27 DIAGNOSIS — M999 Biomechanical lesion, unspecified: Secondary | ICD-10-CM | POA: Diagnosis not present

## 2011-07-27 DIAGNOSIS — M545 Low back pain: Secondary | ICD-10-CM | POA: Diagnosis not present

## 2011-07-27 DIAGNOSIS — M48061 Spinal stenosis, lumbar region without neurogenic claudication: Secondary | ICD-10-CM | POA: Diagnosis not present

## 2011-07-30 ENCOUNTER — Encounter: Payer: Medicare Other | Admitting: Physical Therapy

## 2011-07-30 DIAGNOSIS — M48061 Spinal stenosis, lumbar region without neurogenic claudication: Secondary | ICD-10-CM | POA: Diagnosis not present

## 2011-07-30 DIAGNOSIS — M545 Low back pain: Secondary | ICD-10-CM | POA: Diagnosis not present

## 2011-07-30 DIAGNOSIS — J309 Allergic rhinitis, unspecified: Secondary | ICD-10-CM | POA: Diagnosis not present

## 2011-07-30 DIAGNOSIS — M999 Biomechanical lesion, unspecified: Secondary | ICD-10-CM | POA: Diagnosis not present

## 2011-07-30 LAB — REMOTE ICD DEVICE
CHARGE TIME: 9.569 s
DEV-0020ICD: NEGATIVE
FVT: 0
RV LEAD AMPLITUDE: 15.6416 mv
TOT-0001: 1
TZAT-0004FASTVT: 8
TZAT-0004SLOWVT: 8
TZAT-0005FASTVT: 88 pct
TZAT-0005SLOWVT: 88 pct
TZAT-0005SLOWVT: 91 pct
TZAT-0011FASTVT: 10 ms
TZAT-0011SLOWVT: 10 ms
TZAT-0011SLOWVT: 10 ms
TZAT-0012FASTVT: 200 ms
TZAT-0012SLOWVT: 200 ms
TZAT-0012SLOWVT: 200 ms
TZAT-0013FASTVT: 1
TZAT-0019SLOWVT: 8 V
TZAT-0019SLOWVT: 8 V
TZON-0003SLOWVT: 340 ms
TZST-0001FASTVT: 5
TZST-0001SLOWVT: 4
TZST-0001SLOWVT: 6
TZST-0003FASTVT: 35 J
TZST-0003FASTVT: 35 J
TZST-0003FASTVT: 35 J
TZST-0003SLOWVT: 20 J
TZST-0003SLOWVT: 35 J
TZST-0003SLOWVT: 35 J
VENTRICULAR PACING ICD: 0.42 pct
VF: 0

## 2011-08-01 ENCOUNTER — Encounter: Payer: Medicare Other | Admitting: Physical Therapy

## 2011-08-01 DIAGNOSIS — M48061 Spinal stenosis, lumbar region without neurogenic claudication: Secondary | ICD-10-CM | POA: Diagnosis not present

## 2011-08-01 DIAGNOSIS — M545 Low back pain: Secondary | ICD-10-CM | POA: Diagnosis not present

## 2011-08-01 DIAGNOSIS — M999 Biomechanical lesion, unspecified: Secondary | ICD-10-CM | POA: Diagnosis not present

## 2011-08-03 ENCOUNTER — Encounter: Payer: Medicare Other | Admitting: Physical Therapy

## 2011-08-07 DIAGNOSIS — J309 Allergic rhinitis, unspecified: Secondary | ICD-10-CM | POA: Diagnosis not present

## 2011-08-08 DIAGNOSIS — M999 Biomechanical lesion, unspecified: Secondary | ICD-10-CM | POA: Diagnosis not present

## 2011-08-08 DIAGNOSIS — M5137 Other intervertebral disc degeneration, lumbosacral region: Secondary | ICD-10-CM | POA: Diagnosis not present

## 2011-08-09 ENCOUNTER — Encounter: Payer: Self-pay | Admitting: Gastroenterology

## 2011-08-09 ENCOUNTER — Ambulatory Visit (INDEPENDENT_AMBULATORY_CARE_PROVIDER_SITE_OTHER): Payer: Medicare Other | Admitting: Gastroenterology

## 2011-08-09 VITALS — BP 88/50 | HR 52 | Ht 67.0 in | Wt 153.6 lb

## 2011-08-09 DIAGNOSIS — Z1211 Encounter for screening for malignant neoplasm of colon: Secondary | ICD-10-CM | POA: Diagnosis not present

## 2011-08-09 DIAGNOSIS — Z7901 Long term (current) use of anticoagulants: Secondary | ICD-10-CM

## 2011-08-09 DIAGNOSIS — E538 Deficiency of other specified B group vitamins: Secondary | ICD-10-CM

## 2011-08-09 DIAGNOSIS — Z8719 Personal history of other diseases of the digestive system: Secondary | ICD-10-CM

## 2011-08-09 DIAGNOSIS — Z5181 Encounter for therapeutic drug level monitoring: Secondary | ICD-10-CM

## 2011-08-09 DIAGNOSIS — M5137 Other intervertebral disc degeneration, lumbosacral region: Secondary | ICD-10-CM | POA: Diagnosis not present

## 2011-08-09 DIAGNOSIS — M999 Biomechanical lesion, unspecified: Secondary | ICD-10-CM | POA: Diagnosis not present

## 2011-08-09 MED ORDER — ESOMEPRAZOLE MAGNESIUM 40 MG PO CPDR: 40.0000 mg | DELAYED_RELEASE_CAPSULE | Freq: Every day | ORAL | Status: DC

## 2011-08-09 NOTE — Progress Notes (Signed)
This is a 71 year old patient with chronic atrial fibrillation and chronic Coumadin administration. He currently is asymptomatic in terms of any gastrointestinal or general medical symptoms. He has refused colonoscopy, and recently was scheduled for CT colonoscopy which she canceled. He has regular bowel movements denies melena or hematochezia. His main problem is been chronic acid reflux which is managed with Nexium 40 mg daily although I think he probably takes it when necessary. He certainly denies dysphagia or any hepatobiliary complaints, anorexia or weight loss. Labs are done by primary care, and review of these labs shows a borderline B12 level.  Current Medications, Allergies, Past Medical History, Past Surgical History, Family History and Social History were reviewed in [employer] record.  Pertinent Review of Systems Negative   Physical Exam: Healthy patient in no distress. Blood pressure 88/50, pulse 52, weight 153 pounds. No stigmata of chronic liver disease. I cannot appreciate hepatosplenomegaly, abdominal masses or tenderness. Bowel sounds are normal. Mental status is normal.    Assessment and Plan: Medications for his chronic GERD renewed. I've asked him to take an oral B12 by mouth supplement because of his chronic PPI use. He also will do yearly IFOB cards for occult blood. Please copy this to his primary care physician. Encounter Diagnosis  Name Primary?  . Special screening for malignant neoplasms, colon Yes

## 2011-08-09 NOTE — Patient Instructions (Addendum)
Your physician has requested that you go to the basement for the following lab work before leaving today: Ifob. Dr. [name] wants for your to pick up at your pharmacy a over the counter Vitamin b12 supplement and a Caltrate with Vitamin D to take every day. We have sent the following medications to your pharmacy for you to pick up at your convenience:Nexium. cc: [name], MD

## 2011-08-10 ENCOUNTER — Encounter: Payer: Self-pay | Admitting: *Deleted

## 2011-08-10 DIAGNOSIS — M5137 Other intervertebral disc degeneration, lumbosacral region: Secondary | ICD-10-CM | POA: Diagnosis not present

## 2011-08-10 DIAGNOSIS — M999 Biomechanical lesion, unspecified: Secondary | ICD-10-CM | POA: Diagnosis not present

## 2011-08-13 ENCOUNTER — Other Ambulatory Visit: Payer: Medicare Other

## 2011-08-13 DIAGNOSIS — Z1211 Encounter for screening for malignant neoplasm of colon: Secondary | ICD-10-CM

## 2011-08-13 DIAGNOSIS — M999 Biomechanical lesion, unspecified: Secondary | ICD-10-CM | POA: Diagnosis not present

## 2011-08-13 DIAGNOSIS — M5137 Other intervertebral disc degeneration, lumbosacral region: Secondary | ICD-10-CM | POA: Diagnosis not present

## 2011-08-13 LAB — FECAL OCCULT BLOOD, IMMUNOCHEMICAL: Fecal Occult Bld: NEGATIVE

## 2011-08-14 DIAGNOSIS — J309 Allergic rhinitis, unspecified: Secondary | ICD-10-CM | POA: Diagnosis not present

## 2011-08-15 DIAGNOSIS — M999 Biomechanical lesion, unspecified: Secondary | ICD-10-CM | POA: Diagnosis not present

## 2011-08-15 DIAGNOSIS — M5137 Other intervertebral disc degeneration, lumbosacral region: Secondary | ICD-10-CM | POA: Diagnosis not present

## 2011-08-16 DIAGNOSIS — H2640 Unspecified secondary cataract: Secondary | ICD-10-CM | POA: Diagnosis not present

## 2011-08-16 DIAGNOSIS — H26499 Other secondary cataract, unspecified eye: Secondary | ICD-10-CM | POA: Diagnosis not present

## 2011-08-20 DIAGNOSIS — Z7901 Long term (current) use of anticoagulants: Secondary | ICD-10-CM | POA: Diagnosis not present

## 2011-08-20 DIAGNOSIS — I4891 Unspecified atrial fibrillation: Secondary | ICD-10-CM | POA: Diagnosis not present

## 2011-08-21 ENCOUNTER — Telehealth: Payer: Self-pay | Admitting: Internal Medicine

## 2011-08-21 DIAGNOSIS — J309 Allergic rhinitis, unspecified: Secondary | ICD-10-CM | POA: Diagnosis not present

## 2011-08-21 NOTE — Telephone Encounter (Signed)
Fu call °Pt calling back again °
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
## 2011-08-22 DIAGNOSIS — M999 Biomechanical lesion, unspecified: Secondary | ICD-10-CM | POA: Diagnosis not present

## 2011-08-22 DIAGNOSIS — M5137 Other intervertebral disc degeneration, lumbosacral region: Secondary | ICD-10-CM | POA: Diagnosis not present

## 2011-08-22 NOTE — Telephone Encounter (Signed)
New transmitter and return kit ordered for pt. Pt aware will be sent to him.

## 2011-08-23 DIAGNOSIS — M999 Biomechanical lesion, unspecified: Secondary | ICD-10-CM | POA: Diagnosis not present

## 2011-08-23 DIAGNOSIS — M5137 Other intervertebral disc degeneration, lumbosacral region: Secondary | ICD-10-CM | POA: Diagnosis not present

## 2011-08-23 DIAGNOSIS — J309 Allergic rhinitis, unspecified: Secondary | ICD-10-CM | POA: Diagnosis not present

## 2011-08-27 DIAGNOSIS — M5137 Other intervertebral disc degeneration, lumbosacral region: Secondary | ICD-10-CM | POA: Diagnosis not present

## 2011-08-27 DIAGNOSIS — J309 Allergic rhinitis, unspecified: Secondary | ICD-10-CM | POA: Diagnosis not present

## 2011-08-27 DIAGNOSIS — M999 Biomechanical lesion, unspecified: Secondary | ICD-10-CM | POA: Diagnosis not present

## 2011-08-30 DIAGNOSIS — M999 Biomechanical lesion, unspecified: Secondary | ICD-10-CM | POA: Diagnosis not present

## 2011-08-30 DIAGNOSIS — J309 Allergic rhinitis, unspecified: Secondary | ICD-10-CM | POA: Diagnosis not present

## 2011-08-30 DIAGNOSIS — M5137 Other intervertebral disc degeneration, lumbosacral region: Secondary | ICD-10-CM | POA: Diagnosis not present

## 2011-08-31 DIAGNOSIS — M999 Biomechanical lesion, unspecified: Secondary | ICD-10-CM | POA: Diagnosis not present

## 2011-08-31 DIAGNOSIS — M5137 Other intervertebral disc degeneration, lumbosacral region: Secondary | ICD-10-CM | POA: Diagnosis not present

## 2011-09-04 DIAGNOSIS — J309 Allergic rhinitis, unspecified: Secondary | ICD-10-CM | POA: Diagnosis not present

## 2011-09-04 DIAGNOSIS — M999 Biomechanical lesion, unspecified: Secondary | ICD-10-CM | POA: Diagnosis not present

## 2011-09-04 DIAGNOSIS — M5137 Other intervertebral disc degeneration, lumbosacral region: Secondary | ICD-10-CM | POA: Diagnosis not present

## 2011-09-07 DIAGNOSIS — M5137 Other intervertebral disc degeneration, lumbosacral region: Secondary | ICD-10-CM | POA: Diagnosis not present

## 2011-09-07 DIAGNOSIS — M999 Biomechanical lesion, unspecified: Secondary | ICD-10-CM | POA: Diagnosis not present

## 2011-09-10 DIAGNOSIS — J309 Allergic rhinitis, unspecified: Secondary | ICD-10-CM | POA: Diagnosis not present

## 2011-09-11 DIAGNOSIS — M25569 Pain in unspecified knee: Secondary | ICD-10-CM | POA: Diagnosis not present

## 2011-09-11 DIAGNOSIS — M109 Gout, unspecified: Secondary | ICD-10-CM | POA: Diagnosis not present

## 2011-09-11 DIAGNOSIS — M542 Cervicalgia: Secondary | ICD-10-CM | POA: Diagnosis not present

## 2011-09-12 ENCOUNTER — Other Ambulatory Visit: Payer: Self-pay | Admitting: Gastroenterology

## 2011-09-14 ENCOUNTER — Other Ambulatory Visit: Payer: Self-pay | Admitting: Gastroenterology

## 2011-09-14 DIAGNOSIS — M5137 Other intervertebral disc degeneration, lumbosacral region: Secondary | ICD-10-CM | POA: Diagnosis not present

## 2011-09-14 DIAGNOSIS — M999 Biomechanical lesion, unspecified: Secondary | ICD-10-CM | POA: Diagnosis not present

## 2011-09-14 MED ORDER — DICYCLOMINE HCL 10 MG PO CAPS: 10.0000 mg | ORAL_CAPSULE | ORAL | Status: DC | PRN

## 2011-09-14 NOTE — Telephone Encounter (Signed)
rx refilled.

## 2011-09-18 DIAGNOSIS — J309 Allergic rhinitis, unspecified: Secondary | ICD-10-CM | POA: Diagnosis not present

## 2011-09-21 DIAGNOSIS — J310 Chronic rhinitis: Secondary | ICD-10-CM | POA: Diagnosis not present

## 2011-09-21 DIAGNOSIS — J320 Chronic maxillary sinusitis: Secondary | ICD-10-CM | POA: Diagnosis not present

## 2011-09-21 DIAGNOSIS — M999 Biomechanical lesion, unspecified: Secondary | ICD-10-CM | POA: Diagnosis not present

## 2011-09-21 DIAGNOSIS — M5137 Other intervertebral disc degeneration, lumbosacral region: Secondary | ICD-10-CM | POA: Diagnosis not present

## 2011-09-25 DIAGNOSIS — Z7901 Long term (current) use of anticoagulants: Secondary | ICD-10-CM | POA: Diagnosis not present

## 2011-09-25 DIAGNOSIS — I4891 Unspecified atrial fibrillation: Secondary | ICD-10-CM | POA: Diagnosis not present

## 2011-09-27 DIAGNOSIS — M999 Biomechanical lesion, unspecified: Secondary | ICD-10-CM | POA: Diagnosis not present

## 2011-09-27 DIAGNOSIS — J309 Allergic rhinitis, unspecified: Secondary | ICD-10-CM | POA: Diagnosis not present

## 2011-09-27 DIAGNOSIS — M5137 Other intervertebral disc degeneration, lumbosacral region: Secondary | ICD-10-CM | POA: Diagnosis not present

## 2011-10-02 ENCOUNTER — Ambulatory Visit (INDEPENDENT_AMBULATORY_CARE_PROVIDER_SITE_OTHER): Payer: Medicare Other | Admitting: Internal Medicine

## 2011-10-02 ENCOUNTER — Encounter: Payer: Self-pay | Admitting: Internal Medicine

## 2011-10-02 VITALS — BP 90/62 | HR 52 | Ht 67.0 in | Wt 152.8 lb

## 2011-10-02 DIAGNOSIS — I2589 Other forms of chronic ischemic heart disease: Secondary | ICD-10-CM

## 2011-10-02 DIAGNOSIS — Z95810 Presence of automatic (implantable) cardiac defibrillator: Secondary | ICD-10-CM

## 2011-10-02 DIAGNOSIS — I5022 Chronic systolic (congestive) heart failure: Secondary | ICD-10-CM | POA: Diagnosis not present

## 2011-10-02 DIAGNOSIS — I472 Ventricular tachycardia, unspecified: Secondary | ICD-10-CM

## 2011-10-02 DIAGNOSIS — I4891 Unspecified atrial fibrillation: Secondary | ICD-10-CM | POA: Diagnosis not present

## 2011-10-02 DIAGNOSIS — IMO0002 Reserved for concepts with insufficient information to code with codable children: Secondary | ICD-10-CM

## 2011-10-02 DIAGNOSIS — I493 Ventricular premature depolarization: Secondary | ICD-10-CM

## 2011-10-02 DIAGNOSIS — I4949 Other premature depolarization: Secondary | ICD-10-CM

## 2011-10-02 LAB — ICD DEVICE OBSERVATION
BATTERY VOLTAGE: 3.1 V
BRDY-0002RV: 40 {beats}/min
CHARGE TIME: 9.659 s
RV LEAD AMPLITUDE: 17.9712 mv
RV LEAD IMPEDENCE ICD: 1120 Ohm
RV LEAD THRESHOLD: 1 V
TOT-0006: 20100111000000
TZAT-0001SLOWVT: 1
TZAT-0001SLOWVT: 2
TZAT-0005SLOWVT: 88 pct
TZAT-0005SLOWVT: 91 pct
TZAT-0011FASTVT: 10 ms
TZAT-0012FASTVT: 200 ms
TZAT-0013SLOWVT: 2
TZAT-0013SLOWVT: 2
TZAT-0018FASTVT: NEGATIVE
TZAT-0018SLOWVT: NEGATIVE
TZAT-0018SLOWVT: NEGATIVE
TZAT-0019FASTVT: 8 V
TZON-0003FASTVT: 240 ms
TZON-0004SLOWVT: 32
TZON-0005SLOWVT: 12
TZST-0001FASTVT: 3
TZST-0001FASTVT: 6
TZST-0001SLOWVT: 3
TZST-0001SLOWVT: 4
TZST-0001SLOWVT: 6
TZST-0003FASTVT: 35 J
TZST-0003FASTVT: 35 J
TZST-0003SLOWVT: 25 J
TZST-0003SLOWVT: 35 J
VENTRICULAR PACING ICD: 0.4 pct

## 2011-10-02 MED ORDER — CARVEDILOL 25 MG PO TABS: 12.5000 mg | ORAL_TABLET | Freq: Two times a day (BID) | ORAL | Status: DC

## 2011-10-02 NOTE — Assessment & Plan Note (Signed)
He is related to PVCs that are symptomatic. If we did increase his heart rate even 5 or 10 beats per minute he may be able to overdrive suppress them. As noted above we'll decrease his carvedilol and see if we can do that.

## 2011-10-02 NOTE — Assessment & Plan Note (Signed)
euvolemic 

## 2011-10-02 NOTE — Progress Notes (Signed)
Patient Care Team: [name], MD as PCP - General (Family Medicine)   HPI  [name] is a 71 y.o. 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 He has a history of paroxysmal atrial fibrillation and is on warfarin  He has had no intercurrent discharges. there has been no shortness of breath. There has been no peripheral edema. and no chest pain.   He continues to have spells of palpitations. He has some orthostatic lightheadedness.      Past Medical History  Diagnosis Date  . Coronary artery disease   . Automatic implantable cardiac defibrillator in situ     medtronic  . Atrial fib/flutter, transient   . GERD (gastroesophageal reflux disease)   . H. pylori infection     Hx of     Past Surgical History  Procedure Date  . Nasal sinus surgery 05/31/2010    Dr. Suszanne Conners  . Coronary artery bypass graft 1998    x 5  . Cataract extraction, bilateral   . Knee surgery     right  . Hernia repair     Current Outpatient Prescriptions  Medication Sig Dispense Refill  . acetaminophen (TYLENOL) 650 MG CR tablet Take 650 mg by mouth 3 (three) times daily.      Marland Kitchen allopurinol (ZYLOPRIM) 300 MG tablet Take 150 mg by mouth Daily.      Marland Kitchen aspirin 81 MG tablet Take 81 mg by mouth daily. Every other day       . carvedilol (COREG) 25 MG tablet Take 25 mg by mouth 2 (two) times daily with a meal.        . cetirizine (ZYRTEC) 10 MG tablet Take 10 mg by mouth daily.        . ciclesonide (ALVESCO) 80 MCG/ACT inhaler Inhale 2 puffs into the lungs 2 (two) times daily.        . ciclesonide (OMNARIS) 50 MCG/ACT nasal spray Place 2 sprays into both nostrils 2 (two) times daily.        Marland Kitchen dicyclomine (BENTYL) 10 MG capsule Take 1 capsule (10 mg total) by mouth as needed.  90 capsule  3  . digoxin (LANOXIN) 0.125 MG tablet Take 125 mcg by mouth daily.        Marland Kitchen esomeprazole (NEXIUM) 40 MG capsule Take 1 capsule (40 mg total) by mouth daily.  30  capsule  11  . isosorbide dinitrate (ISORDIL) 30 MG tablet Take 30 mg by mouth daily. Taking 1/2 tablet daily      . lisinopril (PRINIVIL) 20 MG tablet Take 20 mg by mouth daily.        . mometasone (ASMANEX) 220 MCG/INH inhaler Inhale 2 puffs into the lungs daily.        . montelukast (SINGULAIR) 10 MG tablet Take 10 mg by mouth daily.        Marland Kitchen NITROSTAT 0.4 MG SL tablet as needed.      . silodosin (RAPAFLO) 4 MG CAPS capsule Take 4 mg by mouth daily with breakfast.        . simvastatin (ZOCOR) 40 MG tablet Take 40 mg by mouth daily.        Marland Kitchen spironolactone (ALDACTONE) 25 MG tablet Take 25 mg by mouth daily. Taking 1/2 tablet daily       . warfarin (COUMADIN) 5 MG tablet Take 5 mg by mouth daily.          Allergies  Allergen Reactions  .  Amoxicillin     REACTION: unspecified  . Penicillins     REACTION: unspecified    Review of Systems negative except from HPI and PMH  Physical Exam BP 90/62  Pulse 52  Ht 5\' 7"  (1.702 m)  Wt 152 lb 12.8 oz (69.31 kg)  BMI 23.93 kg/m2 Well developed and well nourished in no acute distress HENT normal E scleral and icterus clear Neck Supple JVP flat; carotids brisk and full Clear to ausculation Device pocket well healed; without hematoma or erythema *Regular rate and rhythm, no murmurs gallops or rub Soft with active bowel sounds No clubbing cyanosis none Edema Alert and oriented, grossly normal motor and sensory function Skin Warm and Dry  Electrocardiogram today demonstrates sinus rhythm at 51 Intervals 16/14/46 Nonspecific IVCD PVCs that are very late couple competing with to sinus rhythm and fusion  Assessment and  Plan

## 2011-10-02 NOTE — Patient Instructions (Addendum)
Remote monitoring is used to monitor your Pacemaker of ICD from home. This monitoring reduces the number of office visits required to check your device to one time per year. It allows [name] to keep an eye on the functioning of your device to ensure it is working properly. You are scheduled for a device check from home on January 07, 2012. You may send your transmission at any time that day. If you have a wireless device, the transmission will be sent automatically. After your physician reviews your transmission, you will receive a postcard with your next transmission date.  Your physician wants you to follow-up in: 1 year with Dr Graciela Husbands.  You will receive a reminder letter in the mail two months in advance. If you don't receive a letter, please call our office to schedule the follow-up appointment.  Your physician has recommended you make the following change in your medication: Decrease Coreg to 12.5mg  1 tablet twice daily

## 2011-10-02 NOTE — Assessment & Plan Note (Signed)
Currently anticoagulated. The appropriate to consider the discontinuation of aspirin. I will defer this to Dr. Wonda Cheng

## 2011-10-02 NOTE — Assessment & Plan Note (Signed)
The patient's device was interrogated.  The information was reviewed. No changes were made in the programming.    

## 2011-10-02 NOTE — Assessment & Plan Note (Addendum)
Stable on current meds; he has PVCs and some orthostatic lightheadedness as well as bradycardia. I think is a reasonable trade-off to decrease his carvedilol from 25-12.5 twice daily. Dr. Tresa Endo would like to restore his dosing I will defer that to him

## 2011-10-09 DIAGNOSIS — Z7901 Long term (current) use of anticoagulants: Secondary | ICD-10-CM | POA: Diagnosis not present

## 2011-10-09 DIAGNOSIS — I4891 Unspecified atrial fibrillation: Secondary | ICD-10-CM | POA: Diagnosis not present

## 2011-10-10 DIAGNOSIS — J309 Allergic rhinitis, unspecified: Secondary | ICD-10-CM | POA: Diagnosis not present

## 2011-10-15 DIAGNOSIS — M999 Biomechanical lesion, unspecified: Secondary | ICD-10-CM | POA: Diagnosis not present

## 2011-10-15 DIAGNOSIS — M5137 Other intervertebral disc degeneration, lumbosacral region: Secondary | ICD-10-CM | POA: Diagnosis not present

## 2011-10-15 DIAGNOSIS — J309 Allergic rhinitis, unspecified: Secondary | ICD-10-CM | POA: Diagnosis not present

## 2011-10-19 ENCOUNTER — Telehealth: Payer: Self-pay | Admitting: Internal Medicine

## 2011-10-19 NOTE — Telephone Encounter (Signed)
Pt calling stating he is having more PVC's--dr klein reduced coreg, 9/3 to 1/2 tab (12.5), BID--Will send to dr Graciela Husbands for his advice

## 2011-10-19 NOTE — Telephone Encounter (Signed)
Called pt back with dr Graciela Husbands 's advice --may increase dose of coreg to 25mg  BID if it makes pt feel better--when i phoned mr Schwartzkopf back and gave him instructions he asked who he should call if he should start having frequent PVC's again, dr Tresa Endo or dr Harland German to call dr Tresa Endo first, and he(dr kelly) can get ahold of dr Graciela Husbands if necessary--pt agrees

## 2011-10-19 NOTE — Telephone Encounter (Signed)
New problem:  C/O having PVC.  Medication was changed but not working.

## 2011-10-19 NOTE — Telephone Encounter (Signed)
If he tinks he is better at the previous dose, would enocrourage him to resume it thanks

## 2011-10-22 DIAGNOSIS — J309 Allergic rhinitis, unspecified: Secondary | ICD-10-CM | POA: Diagnosis not present

## 2011-10-29 DIAGNOSIS — J309 Allergic rhinitis, unspecified: Secondary | ICD-10-CM | POA: Diagnosis not present

## 2011-11-06 DIAGNOSIS — I4891 Unspecified atrial fibrillation: Secondary | ICD-10-CM | POA: Diagnosis not present

## 2011-11-06 DIAGNOSIS — Z7901 Long term (current) use of anticoagulants: Secondary | ICD-10-CM | POA: Diagnosis not present

## 2011-11-07 DIAGNOSIS — J309 Allergic rhinitis, unspecified: Secondary | ICD-10-CM | POA: Diagnosis not present

## 2011-11-12 DIAGNOSIS — J309 Allergic rhinitis, unspecified: Secondary | ICD-10-CM | POA: Diagnosis not present

## 2011-11-14 DIAGNOSIS — I2589 Other forms of chronic ischemic heart disease: Secondary | ICD-10-CM | POA: Diagnosis not present

## 2011-11-14 DIAGNOSIS — Z7901 Long term (current) use of anticoagulants: Secondary | ICD-10-CM | POA: Diagnosis not present

## 2011-11-14 DIAGNOSIS — I4949 Other premature depolarization: Secondary | ICD-10-CM | POA: Diagnosis not present

## 2011-11-14 DIAGNOSIS — E782 Mixed hyperlipidemia: Secondary | ICD-10-CM | POA: Diagnosis not present

## 2011-11-19 DIAGNOSIS — J309 Allergic rhinitis, unspecified: Secondary | ICD-10-CM | POA: Diagnosis not present

## 2011-11-23 DIAGNOSIS — E785 Hyperlipidemia, unspecified: Secondary | ICD-10-CM | POA: Diagnosis not present

## 2011-11-23 DIAGNOSIS — M1990 Unspecified osteoarthritis, unspecified site: Secondary | ICD-10-CM | POA: Diagnosis not present

## 2011-11-23 DIAGNOSIS — J45909 Unspecified asthma, uncomplicated: Secondary | ICD-10-CM | POA: Diagnosis not present

## 2011-11-23 DIAGNOSIS — Z125 Encounter for screening for malignant neoplasm of prostate: Secondary | ICD-10-CM | POA: Diagnosis not present

## 2011-11-23 DIAGNOSIS — M109 Gout, unspecified: Secondary | ICD-10-CM | POA: Diagnosis not present

## 2011-11-23 DIAGNOSIS — I2510 Atherosclerotic heart disease of native coronary artery without angina pectoris: Secondary | ICD-10-CM | POA: Diagnosis not present

## 2011-11-23 DIAGNOSIS — F411 Generalized anxiety disorder: Secondary | ICD-10-CM | POA: Diagnosis not present

## 2011-11-23 DIAGNOSIS — Z23 Encounter for immunization: Secondary | ICD-10-CM | POA: Diagnosis not present

## 2011-11-23 DIAGNOSIS — I10 Essential (primary) hypertension: Secondary | ICD-10-CM | POA: Diagnosis not present

## 2011-11-26 DIAGNOSIS — J309 Allergic rhinitis, unspecified: Secondary | ICD-10-CM | POA: Diagnosis not present

## 2011-12-03 DIAGNOSIS — J309 Allergic rhinitis, unspecified: Secondary | ICD-10-CM | POA: Diagnosis not present

## 2011-12-05 DIAGNOSIS — R42 Dizziness and giddiness: Secondary | ICD-10-CM | POA: Diagnosis not present

## 2011-12-05 DIAGNOSIS — Z7901 Long term (current) use of anticoagulants: Secondary | ICD-10-CM | POA: Diagnosis not present

## 2011-12-05 DIAGNOSIS — R0609 Other forms of dyspnea: Secondary | ICD-10-CM | POA: Diagnosis not present

## 2011-12-05 DIAGNOSIS — I2510 Atherosclerotic heart disease of native coronary artery without angina pectoris: Secondary | ICD-10-CM | POA: Diagnosis not present

## 2011-12-05 DIAGNOSIS — I10 Essential (primary) hypertension: Secondary | ICD-10-CM | POA: Diagnosis not present

## 2011-12-05 DIAGNOSIS — I4891 Unspecified atrial fibrillation: Secondary | ICD-10-CM | POA: Diagnosis not present

## 2011-12-10 DIAGNOSIS — J309 Allergic rhinitis, unspecified: Secondary | ICD-10-CM | POA: Diagnosis not present

## 2011-12-12 DIAGNOSIS — M5137 Other intervertebral disc degeneration, lumbosacral region: Secondary | ICD-10-CM | POA: Diagnosis not present

## 2011-12-12 DIAGNOSIS — M999 Biomechanical lesion, unspecified: Secondary | ICD-10-CM | POA: Diagnosis not present

## 2011-12-13 DIAGNOSIS — M5137 Other intervertebral disc degeneration, lumbosacral region: Secondary | ICD-10-CM | POA: Diagnosis not present

## 2011-12-13 DIAGNOSIS — M999 Biomechanical lesion, unspecified: Secondary | ICD-10-CM | POA: Diagnosis not present

## 2011-12-19 DIAGNOSIS — J309 Allergic rhinitis, unspecified: Secondary | ICD-10-CM | POA: Diagnosis not present

## 2011-12-24 DIAGNOSIS — J309 Allergic rhinitis, unspecified: Secondary | ICD-10-CM | POA: Diagnosis not present

## 2011-12-31 DIAGNOSIS — J309 Allergic rhinitis, unspecified: Secondary | ICD-10-CM | POA: Diagnosis not present

## 2012-01-01 DIAGNOSIS — J309 Allergic rhinitis, unspecified: Secondary | ICD-10-CM | POA: Diagnosis not present

## 2012-01-02 DIAGNOSIS — I4891 Unspecified atrial fibrillation: Secondary | ICD-10-CM | POA: Diagnosis not present

## 2012-01-02 DIAGNOSIS — Z7901 Long term (current) use of anticoagulants: Secondary | ICD-10-CM | POA: Diagnosis not present

## 2012-01-02 DIAGNOSIS — M999 Biomechanical lesion, unspecified: Secondary | ICD-10-CM | POA: Diagnosis not present

## 2012-01-02 DIAGNOSIS — M5137 Other intervertebral disc degeneration, lumbosacral region: Secondary | ICD-10-CM | POA: Diagnosis not present

## 2012-01-07 ENCOUNTER — Encounter: Payer: Self-pay | Admitting: Internal Medicine

## 2012-01-07 ENCOUNTER — Ambulatory Visit (INDEPENDENT_AMBULATORY_CARE_PROVIDER_SITE_OTHER): Payer: Medicare Other | Admitting: *Deleted

## 2012-01-07 DIAGNOSIS — I5022 Chronic systolic (congestive) heart failure: Secondary | ICD-10-CM | POA: Diagnosis not present

## 2012-01-07 DIAGNOSIS — Z95810 Presence of automatic (implantable) cardiac defibrillator: Secondary | ICD-10-CM

## 2012-01-07 DIAGNOSIS — J309 Allergic rhinitis, unspecified: Secondary | ICD-10-CM | POA: Diagnosis not present

## 2012-01-07 DIAGNOSIS — I2589 Other forms of chronic ischemic heart disease: Secondary | ICD-10-CM | POA: Diagnosis not present

## 2012-01-15 DIAGNOSIS — I4891 Unspecified atrial fibrillation: Secondary | ICD-10-CM | POA: Diagnosis not present

## 2012-01-15 DIAGNOSIS — I2589 Other forms of chronic ischemic heart disease: Secondary | ICD-10-CM | POA: Diagnosis not present

## 2012-01-15 DIAGNOSIS — E782 Mixed hyperlipidemia: Secondary | ICD-10-CM | POA: Diagnosis not present

## 2012-01-15 DIAGNOSIS — Z7901 Long term (current) use of anticoagulants: Secondary | ICD-10-CM | POA: Diagnosis not present

## 2012-01-16 DIAGNOSIS — J309 Allergic rhinitis, unspecified: Secondary | ICD-10-CM | POA: Diagnosis not present

## 2012-01-24 ENCOUNTER — Other Ambulatory Visit: Payer: Self-pay | Admitting: *Deleted

## 2012-01-24 DIAGNOSIS — J309 Allergic rhinitis, unspecified: Secondary | ICD-10-CM | POA: Diagnosis not present

## 2012-01-24 NOTE — Telephone Encounter (Signed)
Left message for patient to call office back.  Via fax refill request from Dr [facility] patient needs refill on Pantoprazole 40 mg but patient is on Nexium. Not seeing any recent notes of a switch on PPI. Will wait for call back

## 2012-01-28 DIAGNOSIS — Z23 Encounter for immunization: Secondary | ICD-10-CM | POA: Diagnosis not present

## 2012-01-28 DIAGNOSIS — M25519 Pain in unspecified shoulder: Secondary | ICD-10-CM | POA: Diagnosis not present

## 2012-01-31 ENCOUNTER — Encounter: Payer: Self-pay | Admitting: *Deleted

## 2012-01-31 DIAGNOSIS — J309 Allergic rhinitis, unspecified: Secondary | ICD-10-CM | POA: Diagnosis not present

## 2012-02-05 DIAGNOSIS — I4891 Unspecified atrial fibrillation: Secondary | ICD-10-CM | POA: Diagnosis not present

## 2012-02-05 DIAGNOSIS — Z7901 Long term (current) use of anticoagulants: Secondary | ICD-10-CM | POA: Diagnosis not present

## 2012-02-06 ENCOUNTER — Ambulatory Visit: Payer: Medicare Other

## 2012-02-07 ENCOUNTER — Ambulatory Visit: Payer: Medicare Other | Attending: Family Medicine | Admitting: Physical Therapy

## 2012-02-07 DIAGNOSIS — J309 Allergic rhinitis, unspecified: Secondary | ICD-10-CM | POA: Diagnosis not present

## 2012-02-07 DIAGNOSIS — IMO0001 Reserved for inherently not codable concepts without codable children: Secondary | ICD-10-CM | POA: Insufficient documentation

## 2012-02-07 DIAGNOSIS — M25519 Pain in unspecified shoulder: Secondary | ICD-10-CM | POA: Diagnosis not present

## 2012-02-12 ENCOUNTER — Ambulatory Visit: Payer: Medicare Other | Admitting: Physical Therapy

## 2012-02-12 DIAGNOSIS — J309 Allergic rhinitis, unspecified: Secondary | ICD-10-CM | POA: Diagnosis not present

## 2012-02-12 DIAGNOSIS — M25519 Pain in unspecified shoulder: Secondary | ICD-10-CM | POA: Diagnosis not present

## 2012-02-12 DIAGNOSIS — IMO0001 Reserved for inherently not codable concepts without codable children: Secondary | ICD-10-CM | POA: Diagnosis not present

## 2012-02-14 ENCOUNTER — Ambulatory Visit: Payer: Medicare Other | Admitting: Physical Therapy

## 2012-02-14 DIAGNOSIS — M25519 Pain in unspecified shoulder: Secondary | ICD-10-CM | POA: Diagnosis not present

## 2012-02-14 DIAGNOSIS — J309 Allergic rhinitis, unspecified: Secondary | ICD-10-CM | POA: Diagnosis not present

## 2012-02-14 DIAGNOSIS — IMO0001 Reserved for inherently not codable concepts without codable children: Secondary | ICD-10-CM | POA: Diagnosis not present

## 2012-02-19 ENCOUNTER — Ambulatory Visit: Payer: Medicare Other

## 2012-02-19 DIAGNOSIS — J309 Allergic rhinitis, unspecified: Secondary | ICD-10-CM | POA: Diagnosis not present

## 2012-02-19 DIAGNOSIS — IMO0001 Reserved for inherently not codable concepts without codable children: Secondary | ICD-10-CM | POA: Diagnosis not present

## 2012-02-19 DIAGNOSIS — M25519 Pain in unspecified shoulder: Secondary | ICD-10-CM | POA: Diagnosis not present

## 2012-02-21 ENCOUNTER — Ambulatory Visit: Payer: Medicare Other

## 2012-02-21 DIAGNOSIS — IMO0001 Reserved for inherently not codable concepts without codable children: Secondary | ICD-10-CM | POA: Diagnosis not present

## 2012-02-21 DIAGNOSIS — M25519 Pain in unspecified shoulder: Secondary | ICD-10-CM | POA: Diagnosis not present

## 2012-02-26 ENCOUNTER — Ambulatory Visit: Payer: Medicare Other

## 2012-02-26 DIAGNOSIS — M25519 Pain in unspecified shoulder: Secondary | ICD-10-CM | POA: Diagnosis not present

## 2012-02-26 DIAGNOSIS — IMO0001 Reserved for inherently not codable concepts without codable children: Secondary | ICD-10-CM | POA: Diagnosis not present

## 2012-02-26 DIAGNOSIS — J309 Allergic rhinitis, unspecified: Secondary | ICD-10-CM | POA: Diagnosis not present

## 2012-02-28 ENCOUNTER — Ambulatory Visit: Payer: Medicare Other

## 2012-03-04 ENCOUNTER — Ambulatory Visit: Payer: Medicare Other | Attending: Family Medicine | Admitting: Physical Therapy

## 2012-03-04 DIAGNOSIS — IMO0001 Reserved for inherently not codable concepts without codable children: Secondary | ICD-10-CM | POA: Diagnosis not present

## 2012-03-04 DIAGNOSIS — J3081 Allergic rhinitis due to animal (cat) (dog) hair and dander: Secondary | ICD-10-CM | POA: Diagnosis not present

## 2012-03-04 DIAGNOSIS — M25619 Stiffness of unspecified shoulder, not elsewhere classified: Secondary | ICD-10-CM | POA: Insufficient documentation

## 2012-03-04 DIAGNOSIS — J309 Allergic rhinitis, unspecified: Secondary | ICD-10-CM | POA: Diagnosis not present

## 2012-03-04 DIAGNOSIS — M25519 Pain in unspecified shoulder: Secondary | ICD-10-CM | POA: Insufficient documentation

## 2012-03-04 DIAGNOSIS — J3089 Other allergic rhinitis: Secondary | ICD-10-CM | POA: Diagnosis not present

## 2012-03-04 DIAGNOSIS — J45909 Unspecified asthma, uncomplicated: Secondary | ICD-10-CM | POA: Diagnosis not present

## 2012-03-05 DIAGNOSIS — I4891 Unspecified atrial fibrillation: Secondary | ICD-10-CM | POA: Diagnosis not present

## 2012-03-05 DIAGNOSIS — Z7901 Long term (current) use of anticoagulants: Secondary | ICD-10-CM | POA: Diagnosis not present

## 2012-03-06 ENCOUNTER — Ambulatory Visit: Payer: Medicare Other | Admitting: Physical Therapy

## 2012-03-10 DIAGNOSIS — J309 Allergic rhinitis, unspecified: Secondary | ICD-10-CM | POA: Diagnosis not present

## 2012-03-11 ENCOUNTER — Ambulatory Visit: Payer: Medicare Other | Admitting: Physical Therapy

## 2012-03-11 DIAGNOSIS — M109 Gout, unspecified: Secondary | ICD-10-CM | POA: Diagnosis not present

## 2012-03-11 DIAGNOSIS — M19049 Primary osteoarthritis, unspecified hand: Secondary | ICD-10-CM | POA: Diagnosis not present

## 2012-03-11 DIAGNOSIS — M25519 Pain in unspecified shoulder: Secondary | ICD-10-CM | POA: Diagnosis not present

## 2012-03-13 ENCOUNTER — Ambulatory Visit: Payer: Medicare Other | Admitting: Physical Therapy

## 2012-03-18 ENCOUNTER — Ambulatory Visit: Payer: Medicare Other | Admitting: Physical Therapy

## 2012-03-18 DIAGNOSIS — J309 Allergic rhinitis, unspecified: Secondary | ICD-10-CM | POA: Diagnosis not present

## 2012-03-26 ENCOUNTER — Ambulatory Visit: Payer: Medicare Other | Admitting: Physical Therapy

## 2012-03-26 DIAGNOSIS — J309 Allergic rhinitis, unspecified: Secondary | ICD-10-CM | POA: Diagnosis not present

## 2012-03-27 DIAGNOSIS — M999 Biomechanical lesion, unspecified: Secondary | ICD-10-CM | POA: Diagnosis not present

## 2012-03-27 DIAGNOSIS — M5137 Other intervertebral disc degeneration, lumbosacral region: Secondary | ICD-10-CM | POA: Diagnosis not present

## 2012-03-28 DIAGNOSIS — E782 Mixed hyperlipidemia: Secondary | ICD-10-CM | POA: Diagnosis not present

## 2012-03-28 DIAGNOSIS — I2510 Atherosclerotic heart disease of native coronary artery without angina pectoris: Secondary | ICD-10-CM | POA: Diagnosis not present

## 2012-03-28 DIAGNOSIS — I4891 Unspecified atrial fibrillation: Secondary | ICD-10-CM | POA: Diagnosis not present

## 2012-03-28 DIAGNOSIS — Z79899 Other long term (current) drug therapy: Secondary | ICD-10-CM | POA: Diagnosis not present

## 2012-04-01 ENCOUNTER — Encounter (HOSPITAL_COMMUNITY): Payer: Self-pay | Admitting: *Deleted

## 2012-04-01 ENCOUNTER — Emergency Department (HOSPITAL_COMMUNITY)
Admission: EM | Admit: 2012-04-01 | Discharge: 2012-04-01 | Disposition: A | Discharge status: Home / Self Care | Payer: Medicare Other | Attending: Emergency Medicine | Admitting: Emergency Medicine

## 2012-04-01 DIAGNOSIS — I4891 Unspecified atrial fibrillation: Secondary | ICD-10-CM | POA: Insufficient documentation

## 2012-04-01 DIAGNOSIS — Z79899 Other long term (current) drug therapy: Secondary | ICD-10-CM | POA: Insufficient documentation

## 2012-04-01 DIAGNOSIS — Z951 Presence of aortocoronary bypass graft: Secondary | ICD-10-CM | POA: Insufficient documentation

## 2012-04-01 DIAGNOSIS — K219 Gastro-esophageal reflux disease without esophagitis: Secondary | ICD-10-CM | POA: Insufficient documentation

## 2012-04-01 DIAGNOSIS — Z7982 Long term (current) use of aspirin: Secondary | ICD-10-CM | POA: Insufficient documentation

## 2012-04-01 DIAGNOSIS — Z8679 Personal history of other diseases of the circulatory system: Secondary | ICD-10-CM | POA: Insufficient documentation

## 2012-04-01 DIAGNOSIS — S199XXA Unspecified injury of neck, initial encounter: Secondary | ICD-10-CM | POA: Diagnosis not present

## 2012-04-01 DIAGNOSIS — X58XXXA Exposure to other specified factors, initial encounter: Secondary | ICD-10-CM | POA: Insufficient documentation

## 2012-04-01 DIAGNOSIS — Z7901 Long term (current) use of anticoagulants: Secondary | ICD-10-CM | POA: Diagnosis not present

## 2012-04-01 DIAGNOSIS — I2510 Atherosclerotic heart disease of native coronary artery without angina pectoris: Secondary | ICD-10-CM | POA: Diagnosis not present

## 2012-04-01 DIAGNOSIS — Z8619 Personal history of other infectious and parasitic diseases: Secondary | ICD-10-CM | POA: Diagnosis not present

## 2012-04-01 DIAGNOSIS — Y929 Unspecified place or not applicable: Secondary | ICD-10-CM | POA: Insufficient documentation

## 2012-04-01 DIAGNOSIS — S0993XA Unspecified injury of face, initial encounter: Secondary | ICD-10-CM | POA: Diagnosis not present

## 2012-04-01 DIAGNOSIS — Y939 Activity, unspecified: Secondary | ICD-10-CM | POA: Insufficient documentation

## 2012-04-01 MED ORDER — EPINEPHRINE HCL 1 MG/ML IJ SOLN
INTRAMUSCULAR | Status: AC
  Filled 2012-04-01: qty 1

## 2012-04-01 MED ORDER — EPINEPHRINE HCL 1 MG/ML IJ SOLN
INTRAMUSCULAR | Status: AC
  Administered 2012-04-01: 1 mg
  Filled 2012-04-01: qty 1

## 2012-04-01 MED ORDER — EPINEPHRINE HCL 0.1 MG/ML IJ SOLN
INTRAMUSCULAR | Status: AC
  Filled 2012-04-01: qty 10

## 2012-04-01 NOTE — ED Notes (Signed)
gauze packed on tongue

## 2012-04-01 NOTE — ED Provider Notes (Signed)
History     CSN: [id number]  Arrival date & time 04/01/12  0049   First MD Initiated Contact with Patient 04/01/12 0135      Chief Complaint  Patient presents with  . Tongue Bleeding     (Consider location/radiation/quality/duration/timing/severity/associated sxs/prior treatment) HPI Is a 72 year old male on Coumadin for fibrillation. He's been having oozing of blood from the left side of his tongue since about 10 PM yesterday. The bleeding is mild but constant. He has been applying pressure without relief. He denies any lightheadedness, chest pain or shortness of breath. He is not aware of having bitten or otherwise injured his tongue.  Past Medical History  Diagnosis Date  . Coronary artery disease   . Automatic implantable cardiac defibrillator in situ     medtronic  . Atrial fib/flutter, transient   . GERD (gastroesophageal reflux disease)   . H. pylori infection     Hx of     Past Surgical History  Procedure Laterality Date  . Nasal sinus surgery  05/31/2010    Dr. Suszanne Conners  . Coronary artery bypass graft  1998    x 5  . Cataract extraction, bilateral    . Knee surgery      right  . Hernia repair      Family History  Problem Relation Age of Onset  . Colon cancer Neg Hx   . Heart disease Father     questionable    History  Substance Use Topics  . Smoking status: Never Smoker   . Smokeless tobacco: Never Used  . Alcohol Use: Yes     Comment: socially      Review of Systems  All other systems reviewed and are negative.    Allergies  Amoxicillin and Penicillins  Home Medications   Current Outpatient Rx  Name  Route  Sig  Dispense  Refill  . acetaminophen (TYLENOL) 650 MG CR tablet   Oral   Take 650 mg by mouth 3 (three) times daily.         Marland Kitchen allopurinol (ZYLOPRIM) 300 MG tablet   Oral   Take 150 mg by mouth Daily.         Marland Kitchen aspirin 81 MG tablet   Oral   Take 81 mg by mouth daily. Every other day          . carvedilol (COREG) 25 MG  tablet   Oral   Take 0.5 tablets (12.5 mg total) by mouth 2 (two) times daily with a meal.   30 tablet   11   . cetirizine (ZYRTEC) 10 MG tablet   Oral   Take 10 mg by mouth daily.           . ciclesonide (ALVESCO) 80 MCG/ACT inhaler   Inhalation   Inhale 2 puffs into the lungs 2 (two) times daily.           . ciclesonide (OMNARIS) 50 MCG/ACT nasal spray   Each Nare   Place 2 sprays into both nostrils 2 (two) times daily.           Marland Kitchen dicyclomine (BENTYL) 10 MG capsule   Oral   Take 1 capsule (10 mg total) by mouth as needed.   90 capsule   3   . digoxin (LANOXIN) 0.125 MG tablet   Oral   Take 125 mcg by mouth daily.           Marland Kitchen esomeprazole (NEXIUM) 40 MG capsule   Oral   Take  1 capsule (40 mg total) by mouth daily.   30 capsule   11   . isosorbide dinitrate (ISORDIL) 30 MG tablet   Oral   Take 30 mg by mouth daily. Taking 1/2 tablet daily         . lisinopril (PRINIVIL) 20 MG tablet   Oral   Take 20 mg by mouth daily.           . mometasone (ASMANEX) 220 MCG/INH inhaler   Inhalation   Inhale 2 puffs into the lungs daily.           . montelukast (SINGULAIR) 10 MG tablet   Oral   Take 10 mg by mouth daily.           Marland Kitchen NITROSTAT 0.4 MG SL tablet      as needed.         . silodosin (RAPAFLO) 4 MG CAPS capsule   Oral   Take 4 mg by mouth daily with breakfast.           . simvastatin (ZOCOR) 40 MG tablet   Oral   Take 40 mg by mouth daily.           Marland Kitchen spironolactone (ALDACTONE) 25 MG tablet   Oral   Take 25 mg by mouth daily. Taking 1/2 tablet daily          . warfarin (COUMADIN) 5 MG tablet   Oral   Take 5 mg by mouth daily.             BP 121/69  Pulse 56  Temp(Src) 97.8 F (36.6 C) (Oral)  Resp 20  SpO2 98%  Physical Exam General: Well-developed, well-nourished male in no acute distress; appearance consistent with age of record HENT: normocephalic, pinpoint bleeding spot on left aspect of tongue Eyes: pupils  equal round and reactive to light; extraocular muscles intact Neck: supple Heart: Irregular rhythm Lungs: clear to auscultation bilaterally Abdomen: soft; nondistended; nontender Extremities: No deformity; full range of motion; no edema Neurologic: Awake, alert and oriented; motor function intact in all extremities and symmetric; no facial droop Skin: Warm and dry Psychiatric: Normal mood and affect    ED Course  Procedures (including critical care time)    MDM   Nursing notes and vitals signs, including pulse oximetry, reviewed.  Summary of this visit's results, reviewed by myself:  Labs:  Results for orders placed during the hospital encounter of 04/01/12 (from the past 24 hour(s))  PROTIME-INR     Status: Abnormal   Collection Time    04/01/12  1:17 AM      Result Value Range   Prothrombin Time 25.5 (*) 11.6 - 15.2 seconds   INR 2.46 (*) 0.00 - 1.49   2:47 AM Wound hemostatic with apparently stable clot after application of topical 1:10,000 epinephrine solution.     Hanley Seamen, MD 04/01/12 [phone]

## 2012-04-01 NOTE — ED Notes (Signed)
Pt reports onset of bleeding from the left side of his tongue that started approx 2200 yesterday evening, pt admits to being on a coumadin regimen and the bleeding has not ceased. Pt does not recall injuring his tongue. Small amt of blood noted on left tongue - pt called his on-call PCP and was told to be evaluated if bleeding did not stop.

## 2012-04-01 NOTE — ED Notes (Signed)
Pt tongue has stopped bleeding at this time

## 2012-04-02 ENCOUNTER — Ambulatory Visit: Payer: Medicare Other | Attending: Family Medicine | Admitting: Physical Therapy

## 2012-04-02 DIAGNOSIS — IMO0001 Reserved for inherently not codable concepts without codable children: Secondary | ICD-10-CM | POA: Diagnosis not present

## 2012-04-02 DIAGNOSIS — M25519 Pain in unspecified shoulder: Secondary | ICD-10-CM | POA: Insufficient documentation

## 2012-04-02 DIAGNOSIS — J309 Allergic rhinitis, unspecified: Secondary | ICD-10-CM | POA: Diagnosis not present

## 2012-04-03 DIAGNOSIS — I4891 Unspecified atrial fibrillation: Secondary | ICD-10-CM | POA: Diagnosis not present

## 2012-04-03 DIAGNOSIS — Z7901 Long term (current) use of anticoagulants: Secondary | ICD-10-CM | POA: Diagnosis not present

## 2012-04-08 ENCOUNTER — Ambulatory Visit: Payer: Medicare Other | Admitting: Physical Therapy

## 2012-04-08 DIAGNOSIS — J309 Allergic rhinitis, unspecified: Secondary | ICD-10-CM | POA: Diagnosis not present

## 2012-04-08 DIAGNOSIS — M25519 Pain in unspecified shoulder: Secondary | ICD-10-CM | POA: Diagnosis not present

## 2012-04-14 ENCOUNTER — Ambulatory Visit (INDEPENDENT_AMBULATORY_CARE_PROVIDER_SITE_OTHER): Payer: Medicare Other | Admitting: *Deleted

## 2012-04-14 ENCOUNTER — Other Ambulatory Visit: Payer: Self-pay

## 2012-04-14 ENCOUNTER — Encounter: Payer: Self-pay | Admitting: Internal Medicine

## 2012-04-14 DIAGNOSIS — I4729 Other ventricular tachycardia: Secondary | ICD-10-CM

## 2012-04-14 DIAGNOSIS — I5022 Chronic systolic (congestive) heart failure: Secondary | ICD-10-CM | POA: Diagnosis not present

## 2012-04-14 DIAGNOSIS — I472 Ventricular tachycardia: Secondary | ICD-10-CM | POA: Diagnosis not present

## 2012-04-14 DIAGNOSIS — Z95810 Presence of automatic (implantable) cardiac defibrillator: Secondary | ICD-10-CM

## 2012-04-15 ENCOUNTER — Ambulatory Visit: Payer: Self-pay | Admitting: Cardiovascular Disease

## 2012-04-15 DIAGNOSIS — Z7901 Long term (current) use of anticoagulants: Secondary | ICD-10-CM

## 2012-04-15 DIAGNOSIS — I4891 Unspecified atrial fibrillation: Secondary | ICD-10-CM

## 2012-04-16 DIAGNOSIS — J309 Allergic rhinitis, unspecified: Secondary | ICD-10-CM | POA: Diagnosis not present

## 2012-04-20 LAB — REMOTE ICD DEVICE
BRDY-0002RV: 40 {beats}/min
CHARGE TIME: 9.779 s
DEV-0020ICD: NEGATIVE
RV LEAD AMPLITUDE: 19.6 mv
RV LEAD IMPEDENCE ICD: 1104 Ohm
TOT-0001: 1
TOT-0002: 0
TZAT-0001FASTVT: 1
TZAT-0012SLOWVT: 200 ms
TZAT-0012SLOWVT: 200 ms
TZAT-0013FASTVT: 1
TZAT-0013SLOWVT: 2
TZAT-0013SLOWVT: 2
TZAT-0018FASTVT: NEGATIVE
TZAT-0019FASTVT: 8 V
TZAT-0019SLOWVT: 8 V
TZAT-0019SLOWVT: 8 V
TZAT-0020FASTVT: 1.5 ms
TZAT-0020SLOWVT: 1.5 ms
TZAT-0020SLOWVT: 1.5 ms
TZON-0003FASTVT: 240 ms
TZON-0003SLOWVT: 340 ms
TZON-0003VSLOWVT: 450 ms
TZST-0001FASTVT: 2
TZST-0001FASTVT: 4
TZST-0001FASTVT: 5
TZST-0001FASTVT: 6
TZST-0001SLOWVT: 5
TZST-0003FASTVT: 35 J
TZST-0003FASTVT: 35 J
TZST-0003SLOWVT: 25 J
TZST-0003SLOWVT: 35 J
VF: 0

## 2012-04-21 DIAGNOSIS — J309 Allergic rhinitis, unspecified: Secondary | ICD-10-CM | POA: Diagnosis not present

## 2012-04-30 DIAGNOSIS — J309 Allergic rhinitis, unspecified: Secondary | ICD-10-CM | POA: Diagnosis not present

## 2012-05-01 ENCOUNTER — Encounter: Payer: Self-pay | Admitting: *Deleted

## 2012-05-01 DIAGNOSIS — Z7901 Long term (current) use of anticoagulants: Secondary | ICD-10-CM | POA: Diagnosis not present

## 2012-05-01 DIAGNOSIS — I4891 Unspecified atrial fibrillation: Secondary | ICD-10-CM | POA: Diagnosis not present

## 2012-05-06 DIAGNOSIS — J309 Allergic rhinitis, unspecified: Secondary | ICD-10-CM | POA: Diagnosis not present

## 2012-05-12 DIAGNOSIS — J309 Allergic rhinitis, unspecified: Secondary | ICD-10-CM | POA: Diagnosis not present

## 2012-05-14 ENCOUNTER — Telehealth: Payer: Self-pay | Admitting: *Deleted

## 2012-05-14 MED ORDER — ESOMEPRAZOLE MAGNESIUM 40 MG PO CPDR: 40.0000 mg | DELAYED_RELEASE_CAPSULE | Freq: Every day | ORAL | Status: DC

## 2012-05-14 NOTE — Telephone Encounter (Signed)
GAVE PATIENT NEXIUM SAMPLES

## 2012-05-20 DIAGNOSIS — J309 Allergic rhinitis, unspecified: Secondary | ICD-10-CM | POA: Diagnosis not present

## 2012-05-26 DIAGNOSIS — J309 Allergic rhinitis, unspecified: Secondary | ICD-10-CM | POA: Diagnosis not present

## 2012-05-29 DIAGNOSIS — I4891 Unspecified atrial fibrillation: Secondary | ICD-10-CM | POA: Diagnosis not present

## 2012-05-29 DIAGNOSIS — Z7901 Long term (current) use of anticoagulants: Secondary | ICD-10-CM | POA: Diagnosis not present

## 2012-06-02 DIAGNOSIS — J309 Allergic rhinitis, unspecified: Secondary | ICD-10-CM | POA: Diagnosis not present

## 2012-06-12 DIAGNOSIS — N401 Enlarged prostate with lower urinary tract symptoms: Secondary | ICD-10-CM | POA: Diagnosis not present

## 2012-06-12 DIAGNOSIS — N138 Other obstructive and reflux uropathy: Secondary | ICD-10-CM | POA: Diagnosis not present

## 2012-06-12 DIAGNOSIS — R972 Elevated prostate specific antigen [PSA]: Secondary | ICD-10-CM | POA: Diagnosis not present

## 2012-06-13 DIAGNOSIS — J309 Allergic rhinitis, unspecified: Secondary | ICD-10-CM | POA: Diagnosis not present

## 2012-06-17 DIAGNOSIS — H8309 Labyrinthitis, unspecified ear: Secondary | ICD-10-CM | POA: Diagnosis not present

## 2012-06-18 DIAGNOSIS — J309 Allergic rhinitis, unspecified: Secondary | ICD-10-CM | POA: Diagnosis not present

## 2012-06-19 DIAGNOSIS — N401 Enlarged prostate with lower urinary tract symptoms: Secondary | ICD-10-CM | POA: Diagnosis not present

## 2012-06-20 DIAGNOSIS — J309 Allergic rhinitis, unspecified: Secondary | ICD-10-CM | POA: Diagnosis not present

## 2012-06-24 DIAGNOSIS — J309 Allergic rhinitis, unspecified: Secondary | ICD-10-CM | POA: Diagnosis not present

## 2012-06-26 ENCOUNTER — Ambulatory Visit (INDEPENDENT_AMBULATORY_CARE_PROVIDER_SITE_OTHER): Payer: Medicare Other | Admitting: Pharmacist Clinician (PhC)/ Clinical Pharmacy Specialist

## 2012-06-26 VITALS — BP 110/70 | HR 56

## 2012-06-26 DIAGNOSIS — I4891 Unspecified atrial fibrillation: Secondary | ICD-10-CM | POA: Diagnosis not present

## 2012-06-26 DIAGNOSIS — Z7901 Long term (current) use of anticoagulants: Secondary | ICD-10-CM | POA: Diagnosis not present

## 2012-06-30 ENCOUNTER — Other Ambulatory Visit: Payer: Self-pay | Admitting: Internal Medicine

## 2012-06-30 DIAGNOSIS — J309 Allergic rhinitis, unspecified: Secondary | ICD-10-CM | POA: Diagnosis not present

## 2012-07-01 ENCOUNTER — Ambulatory Visit (INDEPENDENT_AMBULATORY_CARE_PROVIDER_SITE_OTHER): Payer: Medicare Other | Admitting: Cardiovascular Disease

## 2012-07-01 ENCOUNTER — Encounter: Payer: Self-pay | Admitting: Cardiovascular Disease

## 2012-07-01 VITALS — BP 120/70 | HR 53 | Ht 67.0 in | Wt 157.0 lb

## 2012-07-01 DIAGNOSIS — I4891 Unspecified atrial fibrillation: Secondary | ICD-10-CM

## 2012-07-01 DIAGNOSIS — I4949 Other premature depolarization: Secondary | ICD-10-CM | POA: Diagnosis not present

## 2012-07-01 DIAGNOSIS — I493 Ventricular premature depolarization: Secondary | ICD-10-CM

## 2012-07-01 DIAGNOSIS — Z95810 Presence of automatic (implantable) cardiac defibrillator: Secondary | ICD-10-CM | POA: Diagnosis not present

## 2012-07-01 DIAGNOSIS — I2589 Other forms of chronic ischemic heart disease: Secondary | ICD-10-CM

## 2012-07-01 MED ORDER — CARVEDILOL 25 MG PO TABS: 25.0000 mg | ORAL_TABLET | Freq: Two times a day (BID) | ORAL | Status: DC

## 2012-07-01 NOTE — Patient Instructions (Signed)
Your physician recommends that you schedule a follow-up appointment in: 6 months  Your physician recommends that you return for lab work in: 6 months   

## 2012-07-08 DIAGNOSIS — J309 Allergic rhinitis, unspecified: Secondary | ICD-10-CM | POA: Diagnosis not present

## 2012-07-09 ENCOUNTER — Telehealth: Payer: Self-pay | Admitting: *Deleted

## 2012-07-09 ENCOUNTER — Telehealth (HOSPITAL_COMMUNITY): Payer: Self-pay | Admitting: Cardiovascular Disease

## 2012-07-09 ENCOUNTER — Other Ambulatory Visit: Payer: Self-pay | Admitting: *Deleted

## 2012-07-09 MED ORDER — EPLERENONE 25 MG PO TABS: 25.0000 mg | ORAL_TABLET | Freq: Every day | ORAL | Status: DC

## 2012-07-09 NOTE — Telephone Encounter (Signed)
Patient telephoned to say that he feels the spironalactone is causing him to have some bilateral breast tenderness. Does not notice any breast enlargement, just the tenderness. He states that he has not taken any today nor yesterday. Told patient that i will defer to Dr. Tresa Endo for advice. However in the meantime i do not recommend him stopping it unless instructed by a MD. Message sent to Dr. Tresa Endo.

## 2012-07-09 NOTE — Telephone Encounter (Signed)
Pt has a few questions about a medication that he is taking. MRN [id number]

## 2012-07-09 NOTE — Telephone Encounter (Signed)
Informed patient Dr.Kelly said to d/c the spironalactone, and start the new rx given for generic inspra.

## 2012-07-11 ENCOUNTER — Telehealth: Payer: Self-pay | Admitting: Cardiovascular Disease

## 2012-07-11 DIAGNOSIS — J309 Allergic rhinitis, unspecified: Secondary | ICD-10-CM | POA: Diagnosis not present

## 2012-07-11 NOTE — Telephone Encounter (Signed)
Did we receive the prior auth that was faxed for Eplerenode?

## 2012-07-11 NOTE — Telephone Encounter (Signed)
Message forwarded to W. Waddell, CMA.  

## 2012-07-14 DIAGNOSIS — J309 Allergic rhinitis, unspecified: Secondary | ICD-10-CM | POA: Diagnosis not present

## 2012-07-15 ENCOUNTER — Telehealth: Payer: Self-pay | Admitting: Cardiovascular Disease

## 2012-07-15 NOTE — Telephone Encounter (Signed)
Returned call.  Pt stated the eplerenone is too expensive.  Stated is $90 for 30 tabs and it's generic.  Stated he was told by the pharmacy that the tier can be changed down.  Pt stated the phone number is [phone]  Membership# [id number].  Pt informed Dr. Tresa Endo will be notified.  Pt verbalized understanding and agreed w/ plan.  Message forwarded to W. Waddell and this note on paper chart# [id number] on desk.

## 2012-07-15 NOTE — Telephone Encounter (Signed)
Has a question about the new medication that Dr. Tresa Endo gave him.

## 2012-07-16 DIAGNOSIS — J309 Allergic rhinitis, unspecified: Secondary | ICD-10-CM | POA: Diagnosis not present

## 2012-07-18 ENCOUNTER — Telehealth: Payer: Self-pay | Admitting: Cardiovascular Disease

## 2012-07-18 NOTE — Telephone Encounter (Signed)
S/W patient informing him that Dr. Tresa Endo has not reviewed request for medication tier change. I called insurance company to get PA form. PA form completed and faxed back to [place]. Waiting for review and approval.

## 2012-07-18 NOTE — Telephone Encounter (Signed)
NEED TO TALK TO YOU ABOUT SOME NEW MEDICINE THAT DR Tresa Endo PUT HIM ON!

## 2012-07-21 ENCOUNTER — Ambulatory Visit (INDEPENDENT_AMBULATORY_CARE_PROVIDER_SITE_OTHER): Payer: Medicare Other | Admitting: *Deleted

## 2012-07-21 ENCOUNTER — Encounter: Payer: Self-pay | Admitting: Internal Medicine

## 2012-07-21 DIAGNOSIS — I472 Ventricular tachycardia: Secondary | ICD-10-CM | POA: Diagnosis not present

## 2012-07-21 DIAGNOSIS — I5022 Chronic systolic (congestive) heart failure: Secondary | ICD-10-CM | POA: Diagnosis not present

## 2012-07-21 DIAGNOSIS — I4729 Other ventricular tachycardia: Secondary | ICD-10-CM

## 2012-07-21 DIAGNOSIS — Z95810 Presence of automatic (implantable) cardiac defibrillator: Secondary | ICD-10-CM

## 2012-07-22 DIAGNOSIS — J309 Allergic rhinitis, unspecified: Secondary | ICD-10-CM | POA: Diagnosis not present

## 2012-07-23 ENCOUNTER — Encounter: Payer: Self-pay | Admitting: Cardiovascular Disease

## 2012-07-23 NOTE — Progress Notes (Signed)
Patient ID: [name], male   DOB: 20-Aug-1940, 72 y.o.   MRN: [id number]     HPI: ETHANAEL VEITH, is a 72 y.o. 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 In September 1998 he underwent CABG revascularization surgery after an unsuccessful attempt at stenting of his proximal LAD by Dr. Dickie La. Ejection fraction was 25-30%. In 2002 he underwent ICD implantation for nonsustained ventricular tachycardia documented on event monitor for primary prevention. In January 2010 he underwent generator change the Medtronic Virtuoso single chamber cardioverter defibrillator. Additional problems include paroxysmal atrial fibrillation, occasional PVCs and an attempt is made in the past 2 overdrive suppress his PVCs with reducing his beta blocker therapy patient felt he had PVCs on the reduced dose and therefore has been on the higher dose.  His last nuclear perfusion study was in November 2013 which showed a large area of scar in the LAD territory (extent 44%) involving the mid to apical anterior,  apical and infero-apical to mid infero-septal and apical lateral wall without associated ischemia.  I last saw him in December 2013. Since that time, he denies chest pain. He denies tachycardia palpitations.  Past Medical History  Diagnosis Date  . Coronary artery disease   . Automatic implantable cardiac defibrillator in situ     medtronic  . Atrial fib/flutter, transient   . GERD (gastroesophageal reflux disease)   . H. pylori infection     Hx of     Past Surgical History  Procedure Laterality Date  . Nasal sinus surgery  05/31/2010    Dr. Suszanne Conners  . Coronary artery bypass graft  1998    x 5  . Cataract extraction, bilateral    . Knee surgery      right  . Hernia repair      Allergies  Allergen Reactions  . Amoxicillin     REACTION: unspecified  . Penicillins     REACTION: unspecified    Current Outpatient Prescriptions    Medication Sig Dispense Refill  . acetaminophen (TYLENOL) 650 MG CR tablet Take 650 mg by mouth 2 (two) times daily.       Marland Kitchen allopurinol (ZYLOPRIM) 300 MG tablet Take 150 mg by mouth Daily.      Marland Kitchen aspirin 81 MG tablet Take 81 mg by mouth daily. Every other day       . cetirizine (ZYRTEC) 10 MG tablet Take 10 mg by mouth daily.        . cholecalciferol (VITAMIN D) 400 UNITS TABS Take by mouth.      . digoxin (LANOXIN) 0.125 MG tablet Take 125 mcg by mouth daily.        Marland Kitchen esomeprazole (NEXIUM) 40 MG capsule Take 1 capsule (40 mg total) by mouth daily.  20 capsule  0  . fish oil-omega-3 fatty acids 1000 MG capsule Take 1 g by mouth daily.      . isosorbide dinitrate (ISORDIL) 30 MG tablet Take 30 mg by mouth daily. Taking 1/2 tablet daily      . lisinopril (PRINIVIL) 20 MG tablet Take 20 mg by mouth daily.        . mometasone (ASMANEX) 220 MCG/INH inhaler Inhale 2 puffs into the lungs daily.        . montelukast (SINGULAIR) 10 MG tablet Take 10 mg by mouth daily.        . nitroGLYCERIN (NITROSTAT) 0.4 MG SL tablet Place 0.4 mg  under the tongue every 5 (five) minutes as needed for chest pain.      . silodosin (RAPAFLO) 4 MG CAPS capsule Take 4 mg by mouth daily with breakfast.        . simvastatin (ZOCOR) 40 MG tablet Take 40 mg by mouth daily.        Marland Kitchen UNABLE TO FIND Allergy injection once a week      . vitamin B-12 (CYANOCOBALAMIN) 50 MCG tablet Take 50 mcg by mouth daily.      Marland Kitchen warfarin (COUMADIN) 5 MG tablet Take 5 mg by mouth daily.        . carvedilol (COREG) 25 MG tablet TAKE ONE-HALF TABLET BY MOUTH TWICE DAILY WITH MEALS  30 tablet  0  . ciclesonide (ALVESCO) 80 MCG/ACT inhaler Inhale 2 puffs into the lungs 2 (two) times daily.        . ciclesonide (OMNARIS) 50 MCG/ACT nasal spray Place 2 sprays into both nostrils 2 (two) times daily.        Marland Kitchen dicyclomine (BENTYL) 10 MG capsule Take 1 capsule (10 mg total) by mouth as needed.  90 capsule  3  . eplerenone (INSPRA) 25 MG tablet Take 1  tablet (25 mg total) by mouth daily.  30 tablet  6  . isosorbide mononitrate (IMDUR) 30 MG 24 hr tablet       . meclizine (ANTIVERT) 12.5 MG tablet        No current facility-administered medications for this visit.    Socially he is married and has 3 children and 5 grandchildren. He does exercise most recently with walking. He used to play tennis. There is no tobacco or alcohol use.  ROS he denies fever chills or night sweats. He is unaware of any recurrent atrial fibrillation. He denies angina. He denies PND orthopnea. He denies bleeding. He denies edema. Other system review is negative.  PE BP 120/70  Pulse 53  Ht 5\' 7"  (1.702 m)  Wt 157 lb (71.215 kg)  BMI 24.58 kg/m2 repeat supine blood pressure 120/70 and standing blood pressure 110/70, without orthostatic change. General: Alert, oriented, no distress.  Skin: normal turgor, no rashes HEENT: Normocephalic, atraumatic. Pupils round and reactive; sclera anicteric;no lid lag,  Nose without nasal septal hypertrophy Mouth/Parynx benign; Mallinpatti scale 2 Neck: No JVD, no carotid briuts Lungs: clear to ausculatation and percussion; no wheezing or rales Heart: RRR, s1 s2 normal 1-2/6 systolic murmur, unchanged. Abdomen: soft, nontender; no hepatosplenomehaly, BS+; abdominal aorta nontender and not dilated by palpation. Pulses 2+ Extremities: no clubbing cyanosis or edema, Homan's sign negative  Neurologic: grossly nonfocal  ECG: Sinus bradycardia 53 beats per minute. No ectopy. Diffuse T wave abnormality the 4 through V6 1 and L. poor R-wave progression compatible with prior anterior wall MI.  LABS:  BMET    Component Value Date/Time   NA 136 05/17/2010 1449   K 4.9 05/17/2010 1449   CL 104 05/17/2010 1449   CO2 28 05/17/2010 1449   GLUCOSE 92 05/17/2010 1449   BUN 12 05/17/2010 1449   CREATININE 0.92 05/17/2010 1449   CALCIUM 9.1 05/17/2010 1449   GFRNONAA >60 05/17/2010 1449   GFRAA  Value: >60        The eGFR has been  calculated using the MDRD equation. This calculation has not been validated in all clinical situations. eGFR's persistently <60 mL/min signify possible Chronic Kidney Disease. 05/17/2010 1449     Hepatic Function Panel     Component Value Date/Time  PROT 6.6 03/23/2010 1106   ALBUMIN 3.9 03/23/2010 1106   AST 22 03/23/2010 1106   ALT 19 03/23/2010 1106   ALKPHOS 49 03/23/2010 1106   BILITOT 0.8 03/23/2010 1106   BILIDIR 0.1 03/23/2010 1106     CBC    Component Value Date/Time   WBC 12.7* 05/17/2010 1449   RBC 5.20 05/17/2010 1449   HGB 15.1 05/17/2010 1449   HCT 44.0 05/17/2010 1449   PLT 252 05/17/2010 1449   MCV 84.6 05/17/2010 1449   MCH 29.0 05/17/2010 1449   MCHC 34.3 05/17/2010 1449   RDW 13.6 05/17/2010 1449   LYMPHSABS 2.0 03/23/2010 1106   MONOABS 1.3* 03/23/2010 1106   EOSABS 0.6 03/23/2010 1106   BASOSABS 0.1 03/23/2010 1106     BNP No results found for this basename: probnp    Lipid Panel  No results found for this basename: chol, trig, hdl, cholhdl, vldl, ldlcalc     RADIOLOGY: No results found.    ASSESSMENT AND PLAN: Mr. Mikey College has ischemic myopathy with an ejection fraction in the 25-35% range. His last nuclear perfusion study in November 2013 was nonischemic. He is doing well on his current medical therapy and is tolerating the carvedilol 25 mg twice a day. He does note some mild tenderness in the breast region and has been on spironolactone. I recommended he discontinue the spironolactone and will I substituted this with more selective and Kerlone at 25 mg to see if this improves his symptoms.     Lennette Bihari, MD, [facility]  07/23/2012 10:24 PM

## 2012-07-24 ENCOUNTER — Telehealth: Payer: Self-pay | Admitting: Cardiovascular Disease

## 2012-07-24 ENCOUNTER — Ambulatory Visit (INDEPENDENT_AMBULATORY_CARE_PROVIDER_SITE_OTHER): Payer: Medicare Other | Admitting: Pharmacist Clinician (PhC)/ Clinical Pharmacy Specialist

## 2012-07-24 VITALS — BP 120/64 | HR 60

## 2012-07-24 DIAGNOSIS — Z7901 Long term (current) use of anticoagulants: Secondary | ICD-10-CM

## 2012-07-24 DIAGNOSIS — I4891 Unspecified atrial fibrillation: Secondary | ICD-10-CM

## 2012-07-24 MED ORDER — EPLERENONE 25 MG PO TABS: 25.0000 mg | ORAL_TABLET | Freq: Every day | ORAL | Status: DC

## 2012-07-28 DIAGNOSIS — J309 Allergic rhinitis, unspecified: Secondary | ICD-10-CM | POA: Diagnosis not present

## 2012-07-28 DIAGNOSIS — H02839 Dermatochalasis of unspecified eye, unspecified eyelid: Secondary | ICD-10-CM | POA: Diagnosis not present

## 2012-07-28 DIAGNOSIS — H5340 Unspecified visual field defects: Secondary | ICD-10-CM | POA: Diagnosis not present

## 2012-07-28 DIAGNOSIS — H5210 Myopia, unspecified eye: Secondary | ICD-10-CM | POA: Diagnosis not present

## 2012-07-29 ENCOUNTER — Telehealth: Payer: Self-pay | Admitting: Cardiovascular Disease

## 2012-07-29 LAB — REMOTE ICD DEVICE
CHARGE TIME: 9.779 s
DEV-0020ICD: NEGATIVE
PACEART VT: 0
RV LEAD AMPLITUDE: 18 mv
TOT-0001: 1
TOT-0006: 20100111000000
TZAT-0004FASTVT: 8
TZAT-0005FASTVT: 88 pct
TZAT-0005SLOWVT: 88 pct
TZAT-0005SLOWVT: 91 pct
TZAT-0011FASTVT: 10 ms
TZAT-0011SLOWVT: 10 ms
TZAT-0011SLOWVT: 10 ms
TZAT-0012FASTVT: 200 ms
TZAT-0012SLOWVT: 200 ms
TZAT-0012SLOWVT: 200 ms
TZAT-0013FASTVT: 1
TZAT-0018SLOWVT: NEGATIVE
TZAT-0018SLOWVT: NEGATIVE
TZAT-0019SLOWVT: 8 V
TZAT-0019SLOWVT: 8 V
TZAT-0020FASTVT: 1.5 ms
TZON-0003SLOWVT: 340 ms
TZON-0003VSLOWVT: 450 ms
TZON-0004VSLOWVT: 20
TZON-0005SLOWVT: 12
TZST-0001FASTVT: 3
TZST-0001FASTVT: 5
TZST-0001SLOWVT: 4
TZST-0003FASTVT: 35 J
TZST-0003FASTVT: 35 J
TZST-0003FASTVT: 35 J
TZST-0003SLOWVT: 20 J
TZST-0003SLOWVT: 35 J
VENTRICULAR PACING ICD: 0.14 pct
VF: 0

## 2012-07-29 NOTE — Telephone Encounter (Signed)
[name] is calling about a letter that he said you were to mail to him for an appeal on some medications. He is not sure whether you were to mail it to him or not .Marland Kitchen Please Call    Thanks

## 2012-08-06 ENCOUNTER — Encounter: Payer: Self-pay | Admitting: *Deleted

## 2012-08-06 DIAGNOSIS — J309 Allergic rhinitis, unspecified: Secondary | ICD-10-CM | POA: Diagnosis not present

## 2012-08-10 ENCOUNTER — Encounter (HOSPITAL_COMMUNITY): Payer: Self-pay | Admitting: *Deleted

## 2012-08-10 ENCOUNTER — Emergency Department (HOSPITAL_COMMUNITY)
Admission: EM | Admit: 2012-08-10 | Discharge: 2012-08-10 | Disposition: A | Discharge status: Home / Self Care | Payer: Medicare Other | Attending: Emergency Medicine | Admitting: Emergency Medicine

## 2012-08-10 ENCOUNTER — Telehealth (HOSPITAL_COMMUNITY): Payer: Self-pay | Admitting: *Deleted

## 2012-08-10 DIAGNOSIS — K219 Gastro-esophageal reflux disease without esophagitis: Secondary | ICD-10-CM | POA: Insufficient documentation

## 2012-08-10 DIAGNOSIS — Z95810 Presence of automatic (implantable) cardiac defibrillator: Secondary | ICD-10-CM | POA: Diagnosis not present

## 2012-08-10 DIAGNOSIS — Z8619 Personal history of other infectious and parasitic diseases: Secondary | ICD-10-CM | POA: Insufficient documentation

## 2012-08-10 DIAGNOSIS — I472 Ventricular tachycardia, unspecified: Secondary | ICD-10-CM | POA: Insufficient documentation

## 2012-08-10 DIAGNOSIS — Z951 Presence of aortocoronary bypass graft: Secondary | ICD-10-CM | POA: Insufficient documentation

## 2012-08-10 DIAGNOSIS — IMO0002 Reserved for concepts with insufficient information to code with codable children: Secondary | ICD-10-CM | POA: Insufficient documentation

## 2012-08-10 DIAGNOSIS — I4891 Unspecified atrial fibrillation: Secondary | ICD-10-CM | POA: Insufficient documentation

## 2012-08-10 DIAGNOSIS — I2589 Other forms of chronic ischemic heart disease: Secondary | ICD-10-CM | POA: Diagnosis not present

## 2012-08-10 DIAGNOSIS — I2510 Atherosclerotic heart disease of native coronary artery without angina pectoris: Secondary | ICD-10-CM | POA: Insufficient documentation

## 2012-08-10 DIAGNOSIS — I499 Cardiac arrhythmia, unspecified: Secondary | ICD-10-CM | POA: Diagnosis not present

## 2012-08-10 DIAGNOSIS — Z7901 Long term (current) use of anticoagulants: Secondary | ICD-10-CM | POA: Insufficient documentation

## 2012-08-10 DIAGNOSIS — R002 Palpitations: Secondary | ICD-10-CM

## 2012-08-10 DIAGNOSIS — I252 Old myocardial infarction: Secondary | ICD-10-CM | POA: Diagnosis not present

## 2012-08-10 DIAGNOSIS — R55 Syncope and collapse: Secondary | ICD-10-CM | POA: Diagnosis not present

## 2012-08-10 DIAGNOSIS — Z880 Allergy status to penicillin: Secondary | ICD-10-CM | POA: Insufficient documentation

## 2012-08-10 DIAGNOSIS — Z79899 Other long term (current) drug therapy: Secondary | ICD-10-CM | POA: Insufficient documentation

## 2012-08-10 DIAGNOSIS — Z7982 Long term (current) use of aspirin: Secondary | ICD-10-CM | POA: Insufficient documentation

## 2012-08-10 DIAGNOSIS — I4729 Other ventricular tachycardia: Secondary | ICD-10-CM | POA: Insufficient documentation

## 2012-08-10 LAB — BASIC METABOLIC PANEL
Calcium: 9.6 mg/dL (ref 8.4–10.5)
GFR calc non Af Amer: 81 mL/min — ABNORMAL LOW (ref >90)
Sodium: 137 mEq/L (ref 135–145)

## 2012-08-10 LAB — CBC
MCH: 27.6 pg (ref 26.0–34.0)
MCHC: 33.4 g/dL (ref 30.0–36.0)
Platelets: 240 10*3/uL (ref 150–400)

## 2012-08-10 LAB — POCT I-STAT TROPONIN I: Troponin i, poc: 0.02 ng/mL (ref 0.00–0.08)

## 2012-08-10 LAB — GLUCOSE, CAPILLARY: Glucose-Capillary: 95 mg/dL (ref 70–99)

## 2012-08-10 NOTE — ED Notes (Signed)
PT was started on inspra and since has been having skipping and fluttering in chest.  Pt states he was driving and felt like he was going to pass out.

## 2012-08-10 NOTE — ED Provider Notes (Signed)
History    CSN: [id number] Arrival date & time 08/10/12  1515  First MD Initiated Contact with Patient 08/10/12 1614     Chief Complaint  Patient presents with  . Near Syncope  . Irregular Heart Beat    HPI The patient reports a history of coronary artery disease as well as coronary artery bypass grafting.  Today he was in his normal state of health when he had some episodes of "skipping beats" and a sense of fluttering in his chest.  He was driving when this occurred and developed near-syncope.  No syncope.  At this time he now states he feels fine and has no complaints.  No chest pain or shortness of breath.  No recent fever or chills.  No significant decreased oral intake.  No other complaints.  He was recently switched off the spur lactone add a beta blocker was added which she was supposed to be taking 25 mg daily he is only taking 12.5 mg daily.  He denies melena hematochezia.  No vomiting.  No hematemesis.  No other complaints.  Past Medical History  Diagnosis Date  . Coronary artery disease   . Automatic implantable cardiac defibrillator in situ     medtronic  . Atrial fib/flutter, transient   . GERD (gastroesophageal reflux disease)   . H. pylori infection     Hx of    Past Surgical History  Procedure Laterality Date  . Nasal sinus surgery  05/31/2010    Dr. Suszanne Conners  . Coronary artery bypass graft  1998    x 5  . Cataract extraction, bilateral    . Knee surgery      right  . Hernia repair     Family History  Problem Relation Age of Onset  . Colon cancer Neg Hx   . Heart disease Father     questionable   History  Substance Use Topics  . Smoking status: Never Smoker   . Smokeless tobacco: Never Used  . Alcohol Use: Yes     Comment: socially    Review of Systems  All other systems reviewed and are negative.    Allergies  Amoxicillin and Penicillins  Home Medications   Current Outpatient Rx  Name  Route  Sig  Dispense  Refill  . acetaminophen (TYLENOL)  650 MG CR tablet   Oral   Take 650 mg by mouth 2 (two) times daily.          Marland Kitchen allopurinol (ZYLOPRIM) 300 MG tablet   Oral   Take 300 mg by mouth Daily.          Marland Kitchen aspirin 81 MG tablet   Oral   Take 81 mg by mouth every other day.          . carvedilol (COREG) 25 MG tablet   Oral   Take 25 mg by mouth 2 (two) times daily with a meal.         . cetirizine (ZYRTEC) 10 MG tablet   Oral   Take 10 mg by mouth daily.           . cholecalciferol (VITAMIN D) 400 UNITS TABS   Oral   Take 400 Units by mouth daily.          . digoxin (LANOXIN) 0.125 MG tablet   Oral   Take 62.5 mcg by mouth daily.          Marland Kitchen eplerenone (INSPRA) 25 MG tablet   Oral   Take  12.5 mg by mouth daily.         Marland Kitchen esomeprazole (NEXIUM) 40 MG capsule   Oral   Take 40 mg by mouth daily before breakfast.         . fish oil-omega-3 fatty acids 1000 MG capsule   Oral   Take 1 g by mouth daily.         . isosorbide mononitrate (IMDUR) 30 MG 24 hr tablet   Oral   Take 15 mg by mouth daily.          Marland Kitchen lisinopril (PRINIVIL) 20 MG tablet   Oral   Take 10 mg by mouth 2 (two) times daily.          . mometasone (ASMANEX) 220 MCG/INH inhaler   Inhalation   Inhale 2 puffs into the lungs daily.           . mometasone (NASONEX) 50 MCG/ACT nasal spray   Nasal   Place 2 sprays into the nose daily.         . montelukast (SINGULAIR) 10 MG tablet   Oral   Take 10 mg by mouth at bedtime.          . nitroGLYCERIN (NITROSTAT) 0.4 MG SL tablet   Sublingual   Place 0.4 mg under the tongue every 5 (five) minutes as needed for chest pain.         . silodosin (RAPAFLO) 4 MG CAPS capsule   Oral   Take 8 mg by mouth every other day.          . simvastatin (ZOCOR) 40 MG tablet   Oral   Take 40 mg by mouth daily.           . vitamin B-12 (CYANOCOBALAMIN) 50 MCG tablet   Oral   Take 50 mcg by mouth daily.         Marland Kitchen warfarin (COUMADIN) 5 MG tablet   Oral   Take 2.5-5 mg by  mouth daily. Takes 5mg  on Sunday, Monday, Wednesday, and Friday. Takes 2.5mg  on Tuesday, Thursday, and Saturday          BP 129/74  Pulse 55  Temp(Src) 97.7 F (36.5 C) (Oral)  Resp 12  SpO2 100% Physical Exam  Nursing note and vitals reviewed. Constitutional: He is oriented to person, place, and time. He appears well-developed and well-nourished.  HENT:  Head: Normocephalic and atraumatic.  Eyes: EOM are normal.  Neck: Normal range of motion.  Cardiovascular: Normal rate, regular rhythm, normal heart sounds and intact distal pulses.   Pulmonary/Chest: Effort normal and breath sounds normal. No respiratory distress.  Abdominal: Soft. He exhibits no distension. There is no tenderness.  Genitourinary: Rectum normal.  Musculoskeletal: Normal range of motion.  Neurological: He is alert and oriented to person, place, and time.  Skin: Skin is warm and dry.  Psychiatric: He has a normal mood and affect. Judgment normal.    ED Course  Procedures (including critical care time) Labs Reviewed  BASIC METABOLIC PANEL - Abnormal; Notable for the following:    GFR calc non Af Amer 81 (*)    All other components within normal limits  CBC  GLUCOSE, CAPILLARY  POCT I-STAT TROPONIN I   No results found. 1. Palpitations   2. Near syncope     MDM  I spoke with the Medtronic rep after interrogation.  The patient had no arrhythmias.  He had no sustained PVCs.  His defibrillator and pacemaker are otherwise working normally.  No malignant arrhythmia is noted.  Discharge home in good condition.  The patient is well-appearing.  Doubt ACS.  Close followup with his cardiologist.  He'll now take his appropriate dose of his new beta blocker  Lyanne Co, MD 08/10/12 1743

## 2012-08-11 ENCOUNTER — Ambulatory Visit (INDEPENDENT_AMBULATORY_CARE_PROVIDER_SITE_OTHER): Payer: Medicare Other | Admitting: Cardiology

## 2012-08-11 ENCOUNTER — Telehealth: Payer: Self-pay | Admitting: Cardiology

## 2012-08-11 ENCOUNTER — Other Ambulatory Visit: Payer: Self-pay

## 2012-08-11 ENCOUNTER — Encounter: Payer: Self-pay | Admitting: Cardiology

## 2012-08-11 VITALS — BP 120/70 | HR 47 | Ht 67.0 in | Wt 156.5 lb

## 2012-08-11 DIAGNOSIS — I472 Ventricular tachycardia: Secondary | ICD-10-CM

## 2012-08-11 DIAGNOSIS — I498 Other specified cardiac arrhythmias: Secondary | ICD-10-CM | POA: Diagnosis not present

## 2012-08-11 DIAGNOSIS — R55 Syncope and collapse: Secondary | ICD-10-CM

## 2012-08-11 DIAGNOSIS — I2510 Atherosclerotic heart disease of native coronary artery without angina pectoris: Secondary | ICD-10-CM | POA: Diagnosis not present

## 2012-08-11 DIAGNOSIS — R001 Bradycardia, unspecified: Secondary | ICD-10-CM

## 2012-08-11 DIAGNOSIS — I4891 Unspecified atrial fibrillation: Secondary | ICD-10-CM | POA: Diagnosis not present

## 2012-08-11 DIAGNOSIS — I2589 Other forms of chronic ischemic heart disease: Secondary | ICD-10-CM

## 2012-08-11 MED ORDER — SPIRONOLACTONE 25 MG PO TABS: 12.5000 mg | ORAL_TABLET | Freq: Every day | ORAL | Status: DC

## 2012-08-11 NOTE — Assessment & Plan Note (Signed)
No chest pain, left extending November 2013 does have large anterior scar but no ischemia.

## 2012-08-11 NOTE — Progress Notes (Signed)
In office ICD interrogation. Normal device function. No changes made this session. 

## 2012-08-11 NOTE — Assessment & Plan Note (Signed)
Heart rate is 47 without acute changes but it is a lower rate than as seen on previous tracings.  He did complain of some mild fatigue. We'll check a dig level and repeat a basic metabolic panel next week.

## 2012-08-11 NOTE — Telephone Encounter (Signed)
Returned call and spoke w/ pharmacist.  Stated pt stated he is taking Eplerenone and just picked up a refill recently and then this script came in for spironolactone.  Stated contraindication to take both.  Informed RN will call pt to clarify what he is taking as he asked for a refill on spironolactone today at [facility].  Verbalized understanding  Call to pt and clarified that pt is taking spironolactone.  Stated it was decided at his appt today that he will take that and stop Inspra (Eplerenone) and f/u with Dr. Tresa Endo in a few weeks.  Pt stated he doesn't need the refill yet b/c he still has some at home and advised to contact pharmacy to inform them so they will hold Rx and to inform them that he is taking spironolactone.  Pt verbalized understanding and agreed w/ plan.

## 2012-08-11 NOTE — Patient Instructions (Addendum)
Followup with Dr. Tresa Endo in 2-3 weeks Have blood work done Monday Call if any similar episodes. Your ICD was checked without problems.

## 2012-08-11 NOTE — Progress Notes (Signed)
08/11/2012   PCP: [name], MD   Chief Complaint  Patient presents with  . concern extra PVC'S    no chest pain, no sob, no edema, went to ER yesterday    Primary Cardiologist: Dr. Tresa Endo  HPI:  [name], is a 72 y.o. 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 In September 1998 he underwent CABG revascularization surgery after an unsuccessful attempt at stenting of his proximal LAD by Dr. Dickie La. Ejection fraction was 25-30%. In 2002 he underwent ICD implantation for nonsustained ventricular tachycardia documented on event monitor for primary prevention. In January 2010 he underwent generator change the Medtronic Virtuoso single chamber cardioverter defibrillator. Additional problems include paroxysmal atrial fibrillation, occasional PVCs and an attempt is made in the past 2 overdrive suppress his PVCs with reducing his beta blocker therapy patient felt he had PVCs on the reduced dose and therefore has been on the higher dose.   His last nuclear perfusion study was in November 2013 which showed a large area of scar in the LAD territory (extent 44%) involving the mid to apical anterior, apical and infero-apical to mid infero-septal and apical lateral wall without associated ischemia  Patient is here today after being seen in the emergency room yesterday for near syncope.  He was driving and felt heart was beating erratically and felt like he might pass out.  He was seen in the emergency room they did check his ICD and no arrhythmias were noted. Vital signs were stable and lab values were normal including normal troponin he denied any chest pain or shortness of breath.  Today he is here for eval.  He has had no more symptoms. Recently Dr. Nicholaus Bloom changed his spironolactone to Inspra secondary to gynecomastia/mild tenderness in the breast region. Additionally his Lanoxin level was decreased in December  secondary to bradycardia.  Today no chest pain or shortness of breath. His heart rate is 47.  We interrogated his ICD there were no arrhythmias with pacing he had no symptoms.  I briefly discussed this with Dr. Rennis Golden he did not feel EKG had changed.  We see no cardiac reason for his symptoms. Patient's concern that perhaps the Inspra has caused a problem and he prefers to go back to spironolactone at this point.  I agreed to change him back and I'll have him follow with Dr. Tresa Endo.  Both the breast pain returns he knows we will need to go back to Inspra.   Allergies  Allergen Reactions  . Amoxicillin     REACTION: unspecified  . Penicillins     REACTION: unspecified    Current Outpatient Prescriptions  Medication Sig Dispense Refill  . acetaminophen (TYLENOL) 650 MG CR tablet Take 650 mg by mouth 2 (two) times daily.       Marland Kitchen allopurinol (ZYLOPRIM) 300 MG tablet Take 300 mg by mouth Daily.       Marland Kitchen aspirin 81 MG tablet Take 81 mg by mouth every other day.       . carvedilol (COREG) 25 MG tablet Take 25 mg by mouth 2 (two) times daily with a meal.      . cetirizine (ZYRTEC) 10 MG tablet Take 10 mg by mouth daily.        . digoxin (LANOXIN) 0.125 MG tablet Take 62.5 mcg by mouth daily.       Marland Kitchen esomeprazole (NEXIUM) 40 MG capsule Take  40 mg by mouth daily before breakfast.      . fish oil-omega-3 fatty acids 1000 MG capsule Take 1 g by mouth daily.      . isosorbide mononitrate (IMDUR) 30 MG 24 hr tablet Take 15 mg by mouth daily.       Marland Kitchen lisinopril (PRINIVIL) 20 MG tablet Take 10 mg by mouth 2 (two) times daily.       . mometasone (ASMANEX) 220 MCG/INH inhaler Inhale 1 puff into the lungs daily.       . mometasone (NASONEX) 50 MCG/ACT nasal spray Place 2 sprays into the nose daily.      . montelukast (SINGULAIR) 10 MG tablet Take 10 mg by mouth at bedtime.       . nitroGLYCERIN (NITROSTAT) 0.4 MG SL tablet Place 0.4 mg under the tongue every 5 (five) minutes as needed for chest pain.      .  silodosin (RAPAFLO) 4 MG CAPS capsule Take 8 mg by mouth. Every 2 days      . simvastatin (ZOCOR) 40 MG tablet Take 40 mg by mouth daily.        . vitamin B-12 (CYANOCOBALAMIN) 50 MCG tablet Take 50 mcg by mouth daily.      Marland Kitchen warfarin (COUMADIN) 5 MG tablet Take 2.5-5 mg by mouth daily. Takes 5mg  on Sunday, Monday, Wednesday, and Friday. Takes 2.5mg  on Tuesday, Thursday, and Saturday      . spironolactone (ALDACTONE) 25 MG tablet Take 0.5 tablets (12.5 mg total) by mouth daily.  90 tablet  3   No current facility-administered medications for this visit.    Past Medical History  Diagnosis Date  . Coronary artery disease     Hx MI 1992, CABG 1998 , Nuc study 11.2013 large scar but no ischemia  . 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 Atrial fib/flutter, transient   . GERD (gastroesophageal reflux disease)   . H. pylori infection     Hx of   . Cardiomyopathy, ischemic 2011    with EF 25-35% by echo  . H/O myocardial infarction, greater than 8 weeks 1992    large ant wall injury  . NSVT (nonsustained ventricular tachycardia)     Past Surgical History  Procedure Laterality Date  . Nasal sinus surgery  05/31/2010    Dr. Suszanne Conners  . Coronary artery bypass graft  1998    x 5  . Cataract extraction, bilateral    . Knee surgery      right  . Hernia repair    . Icd generator change  01/2008    medtronic, hx+ EP study    WJX:BJYNWGN:FA colds or fevers, no weight changes Skin:no rashes or ulcers HEENT:no blurred vision, no congestion CV:see HPI PUL:see HPI GI:no diarrhea constipation or melena, no indigestion GU:no hematuria, no dysuria MS:no joint pain, no claudication Neuro:no syncope, no lightheadedness Endo:no diabetes, no thyroid disease  PHYSICAL EXAM BP 120/70  Pulse 47  Ht 5\' 7"  (1.702 m)  Wt 156 lb 8 oz (70.988 kg)  BMI 24.51 kg/m2 General:Pleasant affect, NAD Skin:Warm and dry, brisk capillary refill HEENT:normocephalic,  sclera clear, mucus membranes moist Neck:supple, no JVD, no bruits  Heart:S1S2 RRR with 1/6 systolic murmur, no gallup, rub or click Lungs:clear without rales, rhonchi, or wheezes OZH:YQMV, non tender, + BS, do not palpate liver spleen or masses Ext:no lower ext edema, 2+ pedal pulses, 2+ radial pulses Neuro:alert and oriented, MAE, follows commands, + facial  symmetry  EKG: Sinus bradycardia rate of 47 left axis deviation he does have flipped T waves in V4 V5 V6 but no acute changes from old tracings.  ASSESSMENT AND PLAN Near syncope ICD interrogated, no symptoms when pt paced.  No arrythmia. No Discharge.  Labs were stable.  Will check dig level on Monday.  CARDIOMYOPATHY, ISCHEMIC No heart failure, no chest pain, no shortness of breath  Bradycardia Heart rate is 47 without acute changes but it is a lower rate than as seen on previous tracings.  He did complain of some mild fatigue. We'll check a dig level and repeat a basic metabolic panel next week.  Coronary artery disease No chest pain, left extending November 2013 does have large anterior scar but no ischemia.    He will followup with Dr. Tresa Endo in several weeks.  He will call if he has further problems.

## 2012-08-11 NOTE — Assessment & Plan Note (Signed)
No heart failure, no chest pain, no shortness of breath

## 2012-08-11 NOTE — Telephone Encounter (Signed)
Just received  Prescription for Spironolactone-Wants to know if pt is still taking this? If so there is an interaction with another medicine pt is taking!

## 2012-08-11 NOTE — Assessment & Plan Note (Addendum)
ICD interrogated, no symptoms when pt paced.  No arrythmia. No Discharge.  Labs were stable.  Will check dig level on Monday.

## 2012-08-12 ENCOUNTER — Telehealth: Payer: Self-pay | Admitting: Cardiovascular Disease

## 2012-08-12 DIAGNOSIS — J309 Allergic rhinitis, unspecified: Secondary | ICD-10-CM | POA: Diagnosis not present

## 2012-08-12 LAB — ICD DEVICE OBSERVATION
BATTERY VOLTAGE: 3.09 V
BRDY-0002RV: 40 {beats}/min
CHARGE TIME: 9.8 s
RV LEAD IMPEDENCE ICD: 1152 Ohm
TZAT-0001FASTVT: 1
TZAT-0001SLOWVT: 1
TZAT-0001SLOWVT: 2
TZAT-0004SLOWVT: 8
TZAT-0004SLOWVT: 8
TZAT-0013SLOWVT: 2
TZAT-0013SLOWVT: 2
TZAT-0018FASTVT: NEGATIVE
TZAT-0019FASTVT: 8 V
TZAT-0019SLOWVT: 8 V
TZAT-0020SLOWVT: 1.5 ms
TZAT-0020SLOWVT: 1.5 ms
TZON-0003FASTVT: 240 ms
TZON-0003VSLOWVT: 450 ms
TZON-0004SLOWVT: 32
TZON-0004VSLOWVT: 20
TZST-0001FASTVT: 2
TZST-0001FASTVT: 4
TZST-0001FASTVT: 6
TZST-0001SLOWVT: 3
TZST-0001SLOWVT: 5
TZST-0003FASTVT: 35 J
TZST-0003FASTVT: 35 J
TZST-0003SLOWVT: 25 J
VENTRICULAR PACING ICD: 0.2 pct

## 2012-08-12 NOTE — Telephone Encounter (Signed)
Coy Saunas is calling from [employer] in regarding an appeal to Inspra and have a of 9 different medications in which need to whether they will be affective or inappropriate for the patient . Please give her a call  Thanks

## 2012-08-13 ENCOUNTER — Telehealth: Payer: Self-pay | Admitting: Cardiovascular Disease

## 2012-08-13 NOTE — Telephone Encounter (Signed)
The appeal for [facility] have been approved!

## 2012-08-14 ENCOUNTER — Encounter: Payer: Self-pay | Admitting: *Deleted

## 2012-08-14 ENCOUNTER — Telehealth: Payer: Self-pay | Admitting: *Deleted

## 2012-08-14 NOTE — Telephone Encounter (Signed)
Mailed [employer] Rx pathways form to patients home address per Nada Boozer.

## 2012-08-15 ENCOUNTER — Encounter: Payer: Self-pay | Admitting: Cardiovascular Disease

## 2012-08-18 DIAGNOSIS — I498 Other specified cardiac arrhythmias: Secondary | ICD-10-CM | POA: Diagnosis not present

## 2012-08-18 DIAGNOSIS — R55 Syncope and collapse: Secondary | ICD-10-CM | POA: Diagnosis not present

## 2012-08-18 DIAGNOSIS — I4891 Unspecified atrial fibrillation: Secondary | ICD-10-CM | POA: Diagnosis not present

## 2012-08-18 LAB — BASIC METABOLIC PANEL
BUN: 11 mg/dL (ref 6–23)
CO2: 28 mEq/L (ref 19–32)
Chloride: 103 mEq/L (ref 96–112)
Glucose, Bld: 97 mg/dL (ref 70–99)
Potassium: 4.1 mEq/L (ref 3.5–5.3)

## 2012-08-19 DIAGNOSIS — J309 Allergic rhinitis, unspecified: Secondary | ICD-10-CM | POA: Diagnosis not present

## 2012-08-21 ENCOUNTER — Ambulatory Visit (INDEPENDENT_AMBULATORY_CARE_PROVIDER_SITE_OTHER): Payer: Medicare Other | Admitting: Pharmacist Clinician (PhC)/ Clinical Pharmacy Specialist

## 2012-08-21 VITALS — BP 120/68 | HR 56

## 2012-08-21 DIAGNOSIS — I4891 Unspecified atrial fibrillation: Secondary | ICD-10-CM

## 2012-08-21 DIAGNOSIS — Z7901 Long term (current) use of anticoagulants: Secondary | ICD-10-CM

## 2012-08-25 ENCOUNTER — Encounter: Payer: Self-pay | Admitting: *Deleted

## 2012-08-25 DIAGNOSIS — J309 Allergic rhinitis, unspecified: Secondary | ICD-10-CM | POA: Diagnosis not present

## 2012-08-28 ENCOUNTER — Telehealth: Payer: Self-pay | Admitting: Cardiovascular Disease

## 2012-08-28 ENCOUNTER — Ambulatory Visit (INDEPENDENT_AMBULATORY_CARE_PROVIDER_SITE_OTHER): Payer: Medicare Other | Admitting: Cardiovascular Disease

## 2012-08-28 ENCOUNTER — Encounter: Payer: Self-pay | Admitting: Cardiovascular Disease

## 2012-08-28 VITALS — BP 112/62 | HR 52 | Ht 67.0 in | Wt 156.8 lb

## 2012-08-28 DIAGNOSIS — I2589 Other forms of chronic ischemic heart disease: Secondary | ICD-10-CM

## 2012-08-28 DIAGNOSIS — I4729 Other ventricular tachycardia: Secondary | ICD-10-CM

## 2012-08-28 DIAGNOSIS — I5022 Chronic systolic (congestive) heart failure: Secondary | ICD-10-CM

## 2012-08-28 DIAGNOSIS — I2510 Atherosclerotic heart disease of native coronary artery without angina pectoris: Secondary | ICD-10-CM

## 2012-08-28 DIAGNOSIS — E785 Hyperlipidemia, unspecified: Secondary | ICD-10-CM

## 2012-08-28 DIAGNOSIS — I4891 Unspecified atrial fibrillation: Secondary | ICD-10-CM

## 2012-08-28 DIAGNOSIS — I472 Ventricular tachycardia: Secondary | ICD-10-CM

## 2012-08-28 MED ORDER — DIGOXIN 125 MCG PO TABS: 62.5000 ug | ORAL_TABLET | Freq: Every day | ORAL | Status: DC

## 2012-08-28 NOTE — Telephone Encounter (Signed)
Was just here-would you please him a copy of his lab work-forgot to get it.

## 2012-08-28 NOTE — Patient Instructions (Signed)
Your physician recommends that you schedule a follow-up appointment in: 6 MONTHS. No changes has been made today. 

## 2012-08-29 ENCOUNTER — Other Ambulatory Visit: Payer: Self-pay | Admitting: *Deleted

## 2012-08-31 ENCOUNTER — Encounter: Payer: Self-pay | Admitting: Cardiovascular Disease

## 2012-08-31 NOTE — Progress Notes (Signed)
Patient ID: [name], male   DOB: 1940-04-13, 72 y.o.   MRN: [id number]     HPI: [name], is a 72 y.o. male who has a history of an ischemic cardiomyopathy and suffered a large anterior wall myocardial infarction in 1992. In September 1998 he underwent CABG revascularization surgery after an unsuccessful attempt at stenting of his proximal LAD by Dr. Dickie La. Ejection fraction was 25-30%. In 2002 he underwent ICD implantation for nonsustained ventricular tachycardia documented on event monitor for primary prevention. In January 2010 he underwent generator change with a  Medtronic Virtuoso single chamber cardioverter defibrillator. Additional problems include paroxysmal atrial fibrillation, occasional PVCs and an attempt is made in the past 2 overdrive suppress his PVCs with reducing his beta blocker therapy patient felt he had PVCs on the reduced dose and therefore has been on the higher dose. Due to bradycardia, his  Lanoxin dose was also reduced.  His last nuclear perfusion study was in November 2013 which showed a large area of scar in the LAD territory (extent 44%) involving the mid to apical anterior,  apical and infero-apical to mid infero-septal and apical lateral wall without associated ischemia.  I last saw him in June 2014. At that time, he was complaining of mild tenderness in the breast region. I felt this possibly could be related to his Spiriva lactone and consequently recommended a trial of Inspra 25 mg to see if this could improve his symptoms. Early, he subsequently was seen in the emergency room for presyncope in July and was concerned that his heart was beating erratically. In the emergency room his ICD was interrogated and no arrhythmias were noted. His vital signs were stable. Head and normal troponin. Patient preferred discontinuing the Inspra particularly due to cost and ultimately has started taking his Spiriva lactone again. He feels that he seems to tolerate this better.  He presents for followup evaluation.  Past Medical History  Diagnosis Date  . Coronary artery disease     Hx MI 1992, CABG 1998 , Nuc study 11.2013 large scar but no ischemia  . 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 Atrial fib/flutter, transient   . GERD (gastroesophageal reflux disease)   . H. pylori infection     Hx of   . Cardiomyopathy, ischemic 2011    with EF 25-35% by echo  . H/O myocardial infarction, greater than 8 weeks 1992    large ant wall injury  . NSVT (nonsustained ventricular tachycardia)     Past Surgical History  Procedure Laterality Date  . Nasal sinus surgery  05/31/2010    Dr. Suszanne Conners  . Coronary artery bypass graft  1998    x 5  . Cataract extraction, bilateral    . Knee surgery      right  . Hernia repair    . Icd generator change  01/2008    medtronic, hx+ EP study    Allergies  Allergen Reactions  . Amoxicillin     REACTION: unspecified  . Penicillins     REACTION: unspecified    Current Outpatient Prescriptions  Medication Sig Dispense Refill  . acetaminophen (TYLENOL) 650 MG CR tablet Take 650 mg by mouth 2 (two) times daily.       Marland Kitchen allopurinol (ZYLOPRIM) 300 MG tablet Take 300 mg by mouth Daily.       Marland Kitchen aspirin 81 MG tablet Take 81 mg by mouth every other day.       Marland Kitchen  carvedilol (COREG) 25 MG tablet Take 25 mg by mouth 2 (two) times daily with a meal.      . cetirizine (ZYRTEC) 10 MG tablet Take 10 mg by mouth daily.        . digoxin (LANOXIN) 0.125 MG tablet Take 0.5 tablets (62.5 mcg total) by mouth daily.  90 tablet  3  . esomeprazole (NEXIUM) 40 MG capsule Take 40 mg by mouth daily before breakfast.      . fish oil-omega-3 fatty acids 1000 MG capsule Take 1 g by mouth daily.      . isosorbide mononitrate (IMDUR) 30 MG 24 hr tablet Take 15 mg by mouth daily.       Marland Kitchen lisinopril (PRINIVIL) 20 MG tablet Take 10 mg by mouth 2 (two) times daily.       . mometasone (ASMANEX) 220 MCG/INH inhaler  Inhale 1 puff into the lungs daily.       . mometasone (NASONEX) 50 MCG/ACT nasal spray Place 2 sprays into the nose daily.      . montelukast (SINGULAIR) 10 MG tablet Take 10 mg by mouth at bedtime.       . nitroGLYCERIN (NITROSTAT) 0.4 MG SL tablet Place 0.4 mg under the tongue every 5 (five) minutes as needed for chest pain.      . silodosin (RAPAFLO) 4 MG CAPS capsule Take 8 mg by mouth. Every 2 days      . simvastatin (ZOCOR) 40 MG tablet Take 40 mg by mouth daily.        Marland Kitchen spironolactone (ALDACTONE) 25 MG tablet Take 0.5 tablets (12.5 mg total) by mouth daily.  90 tablet  3  . vitamin B-12 (CYANOCOBALAMIN) 50 MCG tablet Take 50 mcg by mouth daily.      Marland Kitchen warfarin (COUMADIN) 5 MG tablet Take 2.5-5 mg by mouth daily. Takes 5mg  on Sunday, Monday, Wednesday, and Friday. Takes 2.5mg  on Tuesday, Thursday, and Saturday       No current facility-administered medications for this visit.    Socially he is married and has 3 children and 5 grandchildren. He does exercise most recently with walking. He used to play tennis. There is no tobacco or alcohol use.  ROS he denies fever chills or night sweats. He is unaware of any recurrent atrial fibrillation. He did not have any episodes of VT noted on his recent ICD interrogation He denies angina. He denies PND orthopnea. He denies bleeding. He denies edema. He states his chest and breast region is no longer sore. Other system review is negative.  PE BP 112/62  Pulse 52  Ht 5\' 7"  (1.702 m)  Wt 156 lb 12.8 oz (71.124 kg)  BMI 24.55 kg/m2 repeat supine blood pressure 120/70 and standing blood pressure 110/70, without orthostatic change. General: Alert, oriented, no distress.  Skin: normal turgor, no rashes HEENT: Normocephalic, atraumatic. Pupils round and reactive; sclera anicteric;no lid lag,  Nose without nasal septal hypertrophy Mouth/Parynx benign; Mallinpatti scale 2 Neck: No JVD, no carotid briuts Lungs: clear to ausculatation and percussion;  no wheezing or rales Heart: RRR, s1 s2 normal 1-2/6 systolic murmur, unchanged. Abdomen: soft, nontender; no hepatosplenomehaly, BS+; abdominal aorta nontender and not dilated by palpation. Pulses 2+ Extremities: no clubbing cyanosis or edema, Homan's sign negative  Neurologic: grossly nonfocal  ECG: Sinus bradycardia 52 beats per minute. No ectopy. Diffuse T wave abnormality the 4 through V6 1 and L. Poor R-wave progression compatible with prior anterior wall MI.  LABS:  BMET  Component Value Date/Time   NA 137 08/18/2012 0811   K 4.1 08/18/2012 0811   CL 103 08/18/2012 0811   CO2 28 08/18/2012 0811   GLUCOSE 97 08/18/2012 0811   BUN 11 08/18/2012 0811   CREATININE 0.83 08/18/2012 0811   CREATININE 0.95 08/10/2012 1532   CALCIUM 9.6 08/18/2012 0811   GFRNONAA 81* 08/10/2012 1532   GFRAA >90 08/10/2012 1532     Hepatic Function Panel     Component Value Date/Time   PROT 6.6 03/23/2010 1106   ALBUMIN 3.9 03/23/2010 1106   AST 22 03/23/2010 1106   ALT 19 03/23/2010 1106   ALKPHOS 49 03/23/2010 1106   BILITOT 0.8 03/23/2010 1106   BILIDIR 0.1 03/23/2010 1106     CBC    Component Value Date/Time   WBC 10.1 08/10/2012 1532   RBC 5.37 08/10/2012 1532   HGB 14.8 08/10/2012 1532   HCT 44.3 08/10/2012 1532   PLT 240 08/10/2012 1532   MCV 82.5 08/10/2012 1532   MCH 27.6 08/10/2012 1532   MCHC 33.4 08/10/2012 1532   RDW 14.2 08/10/2012 1532   LYMPHSABS 2.0 03/23/2010 1106   MONOABS 1.3* 03/23/2010 1106   EOSABS 0.6 03/23/2010 1106   BASOSABS 0.1 03/23/2010 1106     BNP No results found for this basename: probnp    Lipid Panel  No results found for this basename: chol,  trig,  hdl,  cholhdl,  vldl,  ldlcalc     RADIOLOGY: No results found.    ASSESSMENT AND PLAN:  Mr. Isac Caddy has ischemic cardiomyopathy with an ejection fraction in the 25-35% range. His last nuclear perfusion study in November 2013 was nonischemic. He is doing well on his current medical therapy and is tolerating the  carvedilol 25 mg twice a day as well as Lanoxin 0.0625 mg daily, lisinopril 10 mg twice a day low-dose nitrates come and is now back on Spironolactone 12.5 mg daily. Recent laboratory were reviewed. He no longer is complaining of tenderness in the breast region and seems to be tolerating the reinstitution of spironolactone. Clinically, he appears well compensated. Is not having anginal symptoms or congestive heart failure. He does not have edema. He remains relatively active. I did renew his medications. I will see him in 6 months for cardiology reevaluation.    Lennette Bihari, MD, [facility]  08/31/2012 11:02 AM

## 2012-09-01 DIAGNOSIS — J309 Allergic rhinitis, unspecified: Secondary | ICD-10-CM | POA: Diagnosis not present

## 2012-09-02 DIAGNOSIS — M109 Gout, unspecified: Secondary | ICD-10-CM | POA: Diagnosis not present

## 2012-09-08 DIAGNOSIS — H5340 Unspecified visual field defects: Secondary | ICD-10-CM | POA: Diagnosis not present

## 2012-09-08 DIAGNOSIS — H47019 Ischemic optic neuropathy, unspecified eye: Secondary | ICD-10-CM | POA: Diagnosis not present

## 2012-09-10 DIAGNOSIS — J309 Allergic rhinitis, unspecified: Secondary | ICD-10-CM | POA: Diagnosis not present

## 2012-09-15 DIAGNOSIS — J309 Allergic rhinitis, unspecified: Secondary | ICD-10-CM | POA: Diagnosis not present

## 2012-09-16 ENCOUNTER — Ambulatory Visit (INDEPENDENT_AMBULATORY_CARE_PROVIDER_SITE_OTHER): Payer: Medicare Other | Admitting: Pharmacist Clinician (PhC)/ Clinical Pharmacy Specialist

## 2012-09-16 VITALS — BP 118/62 | HR 56

## 2012-09-16 DIAGNOSIS — Z7901 Long term (current) use of anticoagulants: Secondary | ICD-10-CM

## 2012-09-16 DIAGNOSIS — I4891 Unspecified atrial fibrillation: Secondary | ICD-10-CM | POA: Diagnosis not present

## 2012-09-16 LAB — POCT INR: INR: 3.9

## 2012-09-22 DIAGNOSIS — J309 Allergic rhinitis, unspecified: Secondary | ICD-10-CM | POA: Diagnosis not present

## 2012-09-23 ENCOUNTER — Ambulatory Visit (INDEPENDENT_AMBULATORY_CARE_PROVIDER_SITE_OTHER): Payer: Medicare Other | Admitting: Gastroenterology

## 2012-09-23 ENCOUNTER — Encounter: Payer: Self-pay | Admitting: Gastroenterology

## 2012-09-23 VITALS — BP 108/80 | HR 57 | Ht 67.0 in | Wt 158.0 lb

## 2012-09-23 DIAGNOSIS — K219 Gastro-esophageal reflux disease without esophagitis: Secondary | ICD-10-CM

## 2012-09-23 DIAGNOSIS — K589 Irritable bowel syndrome without diarrhea: Secondary | ICD-10-CM | POA: Diagnosis not present

## 2012-09-23 DIAGNOSIS — Z7901 Long term (current) use of anticoagulants: Secondary | ICD-10-CM

## 2012-09-23 DIAGNOSIS — Z1211 Encounter for screening for malignant neoplasm of colon: Secondary | ICD-10-CM

## 2012-09-23 MED ORDER — ESOMEPRAZOLE MAGNESIUM 40 MG PO CPDR: 40.0000 mg | DELAYED_RELEASE_CAPSULE | Freq: Every day | ORAL | Status: DC

## 2012-09-23 NOTE — Patient Instructions (Addendum)
  Samples of Nexium was given today and a prescription sent to your pharmacy.  Please follow up with Dr. [name] in one year or sooner if symptoms reoccur.  Your physician has requested that you go to the basement for the following lab work before leaving today: IFOB  _____________________________________________________________________________                                               We are excited to introduce MyChart, a new best-in-class service that provides you online access to important information in your electronic medical record. We want to make it easier for you to view your health information - all in one secure location - when and where you need it. We expect MyChart will enhance the quality of care and service we provide.  When you register for MyChart, you can:    View your test results.    Request appointments and receive appointment reminders via email.    Request medication renewals.    View your medical history, allergies, medications and immunizations.    Communicate with your physician's office through a password-protected site.    Conveniently print information such as your medication lists.  To find out if MyChart is right for you, please talk to a member of our clinical staff today. We will gladly answer your questions about this free health and wellness tool.  If you are age 48 or older and want a member of your family to have access to your record, you must provide written consent by completing a proxy form available at our office. Please speak to our clinical staff about guidelines regarding accounts for patients younger than age 71.  As you activate your MyChart account and need any technical assistance, please call the MyChart technical support line at [phone]) or email your question to [email or web address]. If you email your question(s), please include your name, a return phone number and the best time to reach you.  If  you have non-urgent health-related questions, you can send a message to our office through MyChart at [email or web address]. If you have a medical emergency, call 911.  Thank you for using MyChart as your new health and wellness resource!   MyChart licensed from [employer],  4540-9811. Patents Pending.

## 2012-09-23 NOTE — Progress Notes (Signed)
This is a complicated 72 year old Caucasian male with implantable defibrillator a history of atrial fibrillation and chronic Coumadin administration.  He is on Nexium chronically for acid reflux, and denies a current GI complaints.  His regular bowel movements without melena or hematochezia.  He has not been felt to be a candidate for colonoscopy because of his multiple medical problems, and he had CT colonoscopy scheduled but he canceled this appointment.  He is having regular bowel movements at this time and denies melena or hematochezia.  Appetite is good and his weight is stable.  He denies current active cardiovascular or pulmonary complaints.  He is followed by Dr. Tiburcio Pea in primary care and Dr. Tresa Endo in cardiology.  Current Medications, Allergies, Past Medical History, Past Surgical History, Family History and Social History were reviewed in [employer] record.  ROS: All systems were reviewed and are negative unless otherwise stated in the HPI.          Physical Exam: Blood pressure 108/80, pulse 57 and regular and weight 158 with a BMI of 27.74.  He appears to be in a regular rhythm at this time.  Chest is clear.  There is no organomegaly, abdominal masses or tenderness.  Bowel sounds are normal.  Mental status is normal.    Assessment and Plan: Recently approved has been a stool DNA [Cologuard} exam approved by the food and drug administration for colon cancer screening.  This patient would be an ideal candidate for this analysis, and I've asked him to call our office in 3 months so that we can get this done.  The patient wants to do IFOB cards  In the interium.  Lab data has all been normal from his primary care physician without any evidence of anemia.  He has refused colonoscopy in the past.  His acid reflux is well controlled on daily Nexium 40 mg, and I again in detail I have reviewed the risk and benefits of chronic PPI therapy with this patient.  He is on B12  oral supplements.  IVictor is worried about.  osteoporosis, and I've suggested that he have Dr. Tiburcio Pea do a bone density scan if he feels this is appropriate.  Please copy her primary care physician, referring physician, and pertinent subspecialists.

## 2012-09-30 ENCOUNTER — Ambulatory Visit (INDEPENDENT_AMBULATORY_CARE_PROVIDER_SITE_OTHER): Payer: Medicare Other | Admitting: Pharmacist Clinician (PhC)/ Clinical Pharmacy Specialist

## 2012-09-30 VITALS — BP 118/66 | HR 56

## 2012-09-30 DIAGNOSIS — I4891 Unspecified atrial fibrillation: Secondary | ICD-10-CM

## 2012-09-30 DIAGNOSIS — Z7901 Long term (current) use of anticoagulants: Secondary | ICD-10-CM

## 2012-09-30 LAB — POCT INR: INR: 2

## 2012-10-01 DIAGNOSIS — J309 Allergic rhinitis, unspecified: Secondary | ICD-10-CM | POA: Diagnosis not present

## 2012-10-02 ENCOUNTER — Other Ambulatory Visit (INDEPENDENT_AMBULATORY_CARE_PROVIDER_SITE_OTHER): Payer: Medicare Other

## 2012-10-02 DIAGNOSIS — Z1211 Encounter for screening for malignant neoplasm of colon: Secondary | ICD-10-CM

## 2012-10-02 LAB — FECAL OCCULT BLOOD, IMMUNOCHEMICAL: Fecal Occult Bld: NEGATIVE

## 2012-10-07 DIAGNOSIS — J309 Allergic rhinitis, unspecified: Secondary | ICD-10-CM | POA: Diagnosis not present

## 2012-10-13 ENCOUNTER — Telehealth: Payer: Self-pay | Admitting: Gastroenterology

## 2012-10-13 DIAGNOSIS — J309 Allergic rhinitis, unspecified: Secondary | ICD-10-CM | POA: Diagnosis not present

## 2012-10-14 MED ORDER — DICYCLOMINE HCL 10 MG PO CAPS: 10.0000 mg | ORAL_CAPSULE | Freq: Two times a day (BID) | ORAL | Status: DC | PRN

## 2012-10-14 NOTE — Telephone Encounter (Signed)
Patient advised of the negative Ifob results.  He requested a refill on his dicyclomine. I sent this to his pharmacy

## 2012-10-21 ENCOUNTER — Encounter: Payer: Self-pay | Admitting: Internal Medicine

## 2012-10-21 ENCOUNTER — Telehealth: Payer: Self-pay | Admitting: Gastroenterology

## 2012-10-21 ENCOUNTER — Ambulatory Visit (INDEPENDENT_AMBULATORY_CARE_PROVIDER_SITE_OTHER): Payer: Medicare Other | Admitting: Internal Medicine

## 2012-10-21 VITALS — BP 115/64 | HR 51 | Wt 160.0 lb

## 2012-10-21 DIAGNOSIS — Z95810 Presence of automatic (implantable) cardiac defibrillator: Secondary | ICD-10-CM

## 2012-10-21 DIAGNOSIS — I498 Other specified cardiac arrhythmias: Secondary | ICD-10-CM

## 2012-10-21 DIAGNOSIS — I5022 Chronic systolic (congestive) heart failure: Secondary | ICD-10-CM

## 2012-10-21 DIAGNOSIS — R001 Bradycardia, unspecified: Secondary | ICD-10-CM

## 2012-10-21 DIAGNOSIS — I472 Ventricular tachycardia, unspecified: Secondary | ICD-10-CM

## 2012-10-21 DIAGNOSIS — I4891 Unspecified atrial fibrillation: Secondary | ICD-10-CM

## 2012-10-21 DIAGNOSIS — I2589 Other forms of chronic ischemic heart disease: Secondary | ICD-10-CM | POA: Diagnosis not present

## 2012-10-21 DIAGNOSIS — J309 Allergic rhinitis, unspecified: Secondary | ICD-10-CM | POA: Diagnosis not present

## 2012-10-21 LAB — ICD DEVICE OBSERVATION
BATTERY VOLTAGE: 3.07 V
FVT: 0
PACEART VT: 0
TZAT-0004FASTVT: 8
TZAT-0011SLOWVT: 10 ms
TZAT-0011SLOWVT: 10 ms
TZAT-0012FASTVT: 200 ms
TZAT-0012SLOWVT: 200 ms
TZAT-0012SLOWVT: 200 ms
TZAT-0013FASTVT: 1
TZAT-0018SLOWVT: NEGATIVE
TZAT-0018SLOWVT: NEGATIVE
TZAT-0019SLOWVT: 8 V
TZAT-0019SLOWVT: 8 V
TZAT-0020FASTVT: 1.5 ms
TZON-0003SLOWVT: 340 ms
TZON-0004SLOWVT: 32
TZON-0005SLOWVT: 12
TZST-0001FASTVT: 4
TZST-0001FASTVT: 5
TZST-0001SLOWVT: 4
TZST-0001SLOWVT: 6
TZST-0003FASTVT: 35 J
TZST-0003FASTVT: 35 J
TZST-0003SLOWVT: 20 J
TZST-0003SLOWVT: 35 J
VENTRICULAR PACING ICD: 0.09 pct
VF: 0

## 2012-10-21 NOTE — Telephone Encounter (Signed)
Informed pt the Nurse;s drugbook and a Pharmacist at [facility] out pt pharmacy stated it's OK to take both together; antacids now. Pt stated understanding.

## 2012-10-21 NOTE — Assessment & Plan Note (Signed)
The patient's device was interrogated.  The information was reviewed. No changes were made in the programming.   I am

## 2012-10-21 NOTE — Patient Instructions (Addendum)
Remote monitoring is used to monitor your Pacemaker of ICD from home. This monitoring reduces the number of office visits required to check your device to one time per year. It allows [name] to keep an eye on the functioning of your device to ensure it is working properly. You are scheduled for a device check from home on 129/29/14. You may send your transmission at any time that day. If you have a wireless device, the transmission will be sent automatically. After your physician reviews your transmission, you will receive a postcard with your next transmission date..  Your physician wants you to follow-up in: ONE YEAR WITH DR Graciela Husbands. You will receive a reminder letter in the mail two months in advance. If you don't receive a letter, please call our office to schedule the follow-up appointment.

## 2012-10-21 NOTE — Assessment & Plan Note (Signed)
No intercurrent Ventricular tachycardia  

## 2012-10-21 NOTE — Assessment & Plan Note (Signed)
No intercurrent Atrial fibrillation or flutter  

## 2012-10-21 NOTE — Assessment & Plan Note (Addendum)
Stable. I wonder with sinus rhythm with his digoxin can be discontinued and whether in the absence of chest pain his nitrates to be discontinued. I will defer this to Dr. Wonda Cheng

## 2012-10-21 NOTE — Progress Notes (Signed)
Patient Care Team: [name], MD as PCP - General (Family Medicine)   HPI  [name] is a 72 y.o. 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 He has a history of paroxysmal atrial fibrillation and is on warfarin  He has had no intercurrent discharges. there has been no shortness of breath. There has been no peripheral edema. and no chest pain.    He has worseing of peripheral vision dating back to cabg  He is scheduled neuroopthomolagist  Past Medical History  Diagnosis Date  . Coronary artery disease     Hx MI 1992, CABG 1998 , Nuc study 11.2013 large scar but no ischemia  . 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 Atrial fib/flutter, transient   . GERD (gastroesophageal reflux disease)   . H. pylori infection     Hx of   . Cardiomyopathy, ischemic 2011    with EF 25-35% by echo  . H/O myocardial infarction, greater than 8 weeks 1992    large ant wall injury  . NSVT (nonsustained ventricular tachycardia)     Past Surgical History  Procedure Laterality Date  . Nasal sinus surgery  05/31/2010    Dr. Suszanne Conners  . Coronary artery bypass graft  1998    x 5  . Cataract extraction, bilateral    . Knee surgery      right  . Hernia repair    . Icd generator change  01/2008    medtronic, hx+ EP study    Current Outpatient Prescriptions  Medication Sig Dispense Refill  . acetaminophen (TYLENOL) 650 MG CR tablet Take 650 mg by mouth 2 (two) times daily.       Marland Kitchen allopurinol (ZYLOPRIM) 300 MG tablet Take 150 mg by mouth daily.       Marland Kitchen aspirin 81 MG tablet Take 81 mg by mouth every other day.       . carvedilol (COREG) 25 MG tablet Take 25 mg by mouth 2 (two) times daily with a meal.      . cetirizine (ZYRTEC) 10 MG tablet Take 10 mg by mouth daily.        Marland Kitchen dicyclomine (BENTYL) 10 MG capsule Take 1 capsule (10 mg total) by mouth 2 (two) times daily as needed.  30 capsule  2  .  digoxin (LANOXIN) 0.125 MG tablet Take 0.5 tablets (62.5 mcg total) by mouth daily.  90 tablet  3  . esomeprazole (NEXIUM) 40 MG capsule Take 1 capsule (40 mg total) by mouth daily before breakfast.  30 capsule  11  . fish oil-omega-3 fatty acids 1000 MG capsule Take 1 g by mouth daily.      . isosorbide mononitrate (IMDUR) 30 MG 24 hr tablet Take 15 mg by mouth daily.       Marland Kitchen lisinopril (PRINIVIL) 20 MG tablet Take 10 mg by mouth 2 (two) times daily.       . mometasone (ASMANEX) 220 MCG/INH inhaler Inhale 1 puff into the lungs daily.       . mometasone (NASONEX) 50 MCG/ACT nasal spray Place 2 sprays into the nose daily.      . montelukast (SINGULAIR) 10 MG tablet Take 10 mg by mouth at bedtime.       . nitroGLYCERIN (NITROSTAT) 0.4 MG SL tablet Place 0.4 mg under the tongue every 5 (five) minutes as needed for chest pain.      Marland Kitchen  silodosin (RAPAFLO) 4 MG CAPS capsule Take 8 mg by mouth. Every 2 days      . simvastatin (ZOCOR) 40 MG tablet Take 40 mg by mouth daily.        Marland Kitchen spironolactone (ALDACTONE) 25 MG tablet Take 0.5 tablets (12.5 mg total) by mouth daily.  90 tablet  3  . vitamin B-12 (CYANOCOBALAMIN) 50 MCG tablet Take 50 mcg by mouth daily.      Marland Kitchen warfarin (COUMADIN) 5 MG tablet Take 2.5-5 mg by mouth daily. Takes 5mg  on Sunday, Monday, Wednesday, and Friday. Takes 2.5mg  on Tuesday, Thursday, and Saturday       No current facility-administered medications for this visit.    Allergies  Allergen Reactions  . Amoxicillin     REACTION: unspecified  . Penicillins     REACTION: unspecified    Review of Systems negative except from HPI and PMH  Physical Exam BP 115/64  Pulse 51  Wt 160 lb (72.576 kg)  BMI 25.05 kg/m2 Well developed and nourished in no acute distress HENT normal Neck supple with JVP-flat Clear Regular rate and rhythm, no murmurs or gallops Abd-soft with active BS No Clubbing cyanosis edema Skin-warm and dry A & Oriented  Grossly normal sensory and motor  function  ECG demonstrates sinus bradycardia at 51 intervals 16/13/40 there is left axis deviation and occasional PACs and occasional paced beats   Assessment and  Plan

## 2012-10-22 ENCOUNTER — Telehealth: Payer: Self-pay | Admitting: Gastroenterology

## 2012-10-22 ENCOUNTER — Ambulatory Visit (INDEPENDENT_AMBULATORY_CARE_PROVIDER_SITE_OTHER): Payer: Medicare Other | Admitting: Physician Assistant

## 2012-10-22 ENCOUNTER — Encounter: Payer: Self-pay | Admitting: Physician Assistant

## 2012-10-22 VITALS — BP 108/56 | HR 57 | Ht 67.0 in | Wt 161.0 lb

## 2012-10-22 DIAGNOSIS — K589 Irritable bowel syndrome without diarrhea: Secondary | ICD-10-CM

## 2012-10-22 DIAGNOSIS — K219 Gastro-esophageal reflux disease without esophagitis: Secondary | ICD-10-CM | POA: Diagnosis not present

## 2012-10-22 DIAGNOSIS — K3189 Other diseases of stomach and duodenum: Secondary | ICD-10-CM | POA: Diagnosis not present

## 2012-10-22 DIAGNOSIS — R1013 Epigastric pain: Secondary | ICD-10-CM

## 2012-10-22 NOTE — Progress Notes (Signed)
Subjective:    Patient ID: [name], male    DOB: April 26, 1940, 72 y.o.   MRN: [id number]  HPI  [name] is a pleasant 72 year old male known to Dr. [name] who has history of chronic GERD. He also has sig significant coronary artery disease with an ischemic cardiomyopathy and is status post ICD placement, he has history of atrial for relation nonsustained V. tach and is on chronic Coumadin. He is also felt to have IBS. He was just in to see Dr. [name] 09/23/2012 and at that time was doing well with no specific GI complaints. He is maintained on Nexium 40 mg by mouth daily. He comes in today stating it is been having some mild epigastric discomfort over the past 3-4 days and some slight queasiness. He's not had any vomiting. No fever or chills. He does feel his appetite is somewhat decreased. His bowel movements have been normal he does complain of some bloating and gassiness which has not been severe. He states that he has dicyclomine to take at home for spasms and discomfort. He had not been taking this regularly but started it yesterday. He is not sure whether it has helped as yet. He seems concerned because he has a trip coming up to the beach and wants to be able to eat a lot of seafood.    Review of Systems  Constitutional: Negative.   HENT: Negative.   Eyes: Negative.   Respiratory: Negative.   Cardiovascular: Negative.   Gastrointestinal: Positive for nausea and abdominal pain.  Endocrine: Negative.   Genitourinary: Negative.   Allergic/Immunologic: Negative.   Neurological: Negative.   Hematological: Negative.   Psychiatric/Behavioral: Negative.    Outpatient Prescriptions Prior to Visit  Medication Sig Dispense Refill  . acetaminophen (TYLENOL) 650 MG CR tablet Take 650 mg by mouth 2 (two) times daily.       Marland Kitchen allopurinol (ZYLOPRIM) 300 MG tablet Take 150 mg by mouth daily.       Marland Kitchen aspirin 81 MG tablet Take 81 mg by mouth every other day.       . carvedilol (COREG) 25 MG  tablet Take 25 mg by mouth 2 (two) times daily with a meal.      . cetirizine (ZYRTEC) 10 MG tablet Take 10 mg by mouth daily.        Marland Kitchen dicyclomine (BENTYL) 10 MG capsule Take 1 capsule (10 mg total) by mouth 2 (two) times daily as needed.  30 capsule  2  . digoxin (LANOXIN) 0.125 MG tablet Take 0.5 tablets (62.5 mcg total) by mouth daily.  90 tablet  3  . esomeprazole (NEXIUM) 40 MG capsule Take 1 capsule (40 mg total) by mouth daily before breakfast.  30 capsule  11  . fish oil-omega-3 fatty acids 1000 MG capsule Take 1 g by mouth daily.      . isosorbide mononitrate (IMDUR) 30 MG 24 hr tablet Take 15 mg by mouth daily.       Marland Kitchen lisinopril (PRINIVIL) 20 MG tablet Take 10 mg by mouth 2 (two) times daily.       . mometasone (ASMANEX) 220 MCG/INH inhaler Inhale 1 puff into the lungs daily.       . mometasone (NASONEX) 50 MCG/ACT nasal spray Place 2 sprays into the nose daily.      . montelukast (SINGULAIR) 10 MG tablet Take 10 mg by mouth at bedtime.       . nitroGLYCERIN (NITROSTAT) 0.4 MG SL tablet Place 0.4 mg under  the tongue every 5 (five) minutes as needed for chest pain.      . silodosin (RAPAFLO) 4 MG CAPS capsule Take 8 mg by mouth. Every 2 days      . simvastatin (ZOCOR) 40 MG tablet Take 40 mg by mouth daily.        Marland Kitchen spironolactone (ALDACTONE) 25 MG tablet Take 0.5 tablets (12.5 mg total) by mouth daily.  90 tablet  3  . vitamin B-12 (CYANOCOBALAMIN) 50 MCG tablet Take 50 mcg by mouth daily.      Marland Kitchen warfarin (COUMADIN) 5 MG tablet Take 2.5-5 mg by mouth daily. Takes 5mg  on Sunday, Monday, Wednesday, and Friday. Takes 2.5mg  on Tuesday, Thursday, and Saturday       No facility-administered medications prior to visit.   Allergies  Allergen Reactions  . Amoxicillin     REACTION: unspecified  . Penicillins     REACTION: unspecified   Patient Active Problem List   Diagnosis Date Noted  . Near syncope 08/11/2012  . Bradycardia 08/11/2012  . Coronary artery disease   . Long term  (current) use of anticoagulants 04/15/2012  . PVC (premature ventricular contraction) 10/02/2011  . Automatic implantable cardiac defibrillator Medtronic   . SYSTOLIC HEART FAILURE, 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  . NONSPECIFIC ABNORMAL FINDING IN STOOL CONTENTS 08/19/2008  . VENTRICULAR TACHYCARDIA 05/04/2008  . GERD 03/02/2008  . DIVERTICULITIS, ACUTE 10/10/2007  . ABDOMINAL PAIN, LEFT LOWER QUADRANT 10/10/2007  . HYPERLIPIDEMIA 10/09/2007  . ASTHMA 10/09/2007  . IRRITABLE BOWEL SYNDROME 10/09/2007  . NEPHROLITHIASIS 10/09/2007  . CARDIOMYOPATHY, ISCHEMIC 05/13/2007  . FIBRILLATION, ATRIAL 05/13/2007  . DYSPEPSIA 05/13/2007   History  Substance Use Topics  . Smoking status: Never Smoker   . Smokeless tobacco: Never Used  . Alcohol Use: Yes     Comment: socially      family history includes Healthy in his brother and sister; Heart disease in his father; Heart failure in his sister; Stroke in his mother. There is no history of Colon cancer.  Objective:   Physical Exam  well-developed older male in no acute distress, pleasant blood pressure 108/56 pulse 57 height 5 foot 7 weight 161. HEENT; nontraumatic normocephalic EOMI PERRLA sclera anicteric, Supple; no JVD, Cardiovascular; regular rate and rhythm with S1-S2 there is an ICD in the left chest wall, Pulmonary ;clear bilaterally, Abdomen; soft basically nontender no palpable mass or hepatosplenomegaly bowel sounds are active, Rectal; exam not done, Extremities; no clubbing cyanosis or edema skin warm and dry, Psych; mood and affect normal and appropriate        Assessment & Plan:  #91  72 year old male with chronic GERD stable #2  3-4 day history of mild epigastric discomfort and queasiness-this may be IBS related dyspepsia or a mild viral syndrome. His exam is unremarkable #3 coronary artery disease status post CABG #4 ischemic cardiomyopathy with ICD #5 chronic Coumadin #6 history of atrial fibrillation #7 history of V. Tach  Plan;  will increase Nexium to 40 mg by mouth twice daily x2 weeks and also have asked him to take the dicyclomine 10 mg by mouth twice daily over the next 2 weeks. I suggested checking labs but he would like to see how he does with medication first. He will call in 2 weeks and if symptoms are persisting we'll proceed with labs and perhaps imaging.

## 2012-10-22 NOTE — Telephone Encounter (Signed)
Pt with hx of GERD, IBS and anti coagulated for implantable defibrillator. Pt seen for check up on 09/23/12 with no complaints. Pt called me yesterday to ask if he could take Dicyclomine and Nexium together and after research I informed him he could. Today he states for the past 3-4 days he's had upper abdominal pain and spasms. I asked if he took the dicyclomine and he stated x2 w/o much improvement. Pt asked to be seen and is scheduled to see Mike Gip, PA today.

## 2012-10-22 NOTE — Patient Instructions (Addendum)
Increase the Nexium 40 mg to twice daily for 2 weeks.  Take Dicyclomine twice daily over the next 2 weeks and then as needed.  We have given you information on Irritable Bowel Syndrome and a Gas Prevention Diet.  Call us back in 2 weeks if you are still having problems.

## 2012-10-24 ENCOUNTER — Ambulatory Visit: Payer: Medicare Other | Admitting: Pharmacist Clinician (PhC)/ Clinical Pharmacy Specialist

## 2012-10-28 ENCOUNTER — Ambulatory Visit: Payer: Medicare Other | Admitting: Pharmacist Clinician (PhC)/ Clinical Pharmacy Specialist

## 2012-10-30 ENCOUNTER — Ambulatory Visit (INDEPENDENT_AMBULATORY_CARE_PROVIDER_SITE_OTHER): Payer: Medicare Other | Admitting: Pharmacist Clinician (PhC)/ Clinical Pharmacy Specialist

## 2012-10-30 VITALS — BP 130/76 | HR 56

## 2012-10-30 DIAGNOSIS — I4891 Unspecified atrial fibrillation: Secondary | ICD-10-CM | POA: Diagnosis not present

## 2012-10-30 DIAGNOSIS — Z7901 Long term (current) use of anticoagulants: Secondary | ICD-10-CM | POA: Diagnosis not present

## 2012-10-30 DIAGNOSIS — J309 Allergic rhinitis, unspecified: Secondary | ICD-10-CM | POA: Diagnosis not present

## 2012-10-30 LAB — POCT INR: INR: 2.2

## 2012-10-31 ENCOUNTER — Encounter: Payer: Self-pay | Admitting: Internal Medicine

## 2012-11-03 DIAGNOSIS — J310 Chronic rhinitis: Secondary | ICD-10-CM | POA: Diagnosis not present

## 2012-11-03 DIAGNOSIS — J330 Polyp of nasal cavity: Secondary | ICD-10-CM | POA: Diagnosis not present

## 2012-11-03 DIAGNOSIS — H903 Sensorineural hearing loss, bilateral: Secondary | ICD-10-CM | POA: Diagnosis not present

## 2012-11-03 DIAGNOSIS — J309 Allergic rhinitis, unspecified: Secondary | ICD-10-CM | POA: Diagnosis not present

## 2012-11-03 DIAGNOSIS — H6120 Impacted cerumen, unspecified ear: Secondary | ICD-10-CM | POA: Diagnosis not present

## 2012-11-03 DIAGNOSIS — H902 Conductive hearing loss, unspecified: Secondary | ICD-10-CM | POA: Diagnosis not present

## 2012-11-04 DIAGNOSIS — J309 Allergic rhinitis, unspecified: Secondary | ICD-10-CM | POA: Diagnosis not present

## 2012-11-07 ENCOUNTER — Other Ambulatory Visit (INDEPENDENT_AMBULATORY_CARE_PROVIDER_SITE_OTHER): Payer: Medicare Other

## 2012-11-07 ENCOUNTER — Ambulatory Visit (HOSPITAL_COMMUNITY)
Admission: RE | Admit: 2012-11-07 | Discharge: 2012-11-07 | Disposition: A | Discharge status: Home / Self Care | Payer: Medicare Other | Source: Ambulatory Visit | Attending: Gastroenterology | Admitting: Gastroenterology

## 2012-11-07 ENCOUNTER — Telehealth: Payer: Self-pay | Admitting: Gastroenterology

## 2012-11-07 DIAGNOSIS — R197 Diarrhea, unspecified: Secondary | ICD-10-CM

## 2012-11-07 DIAGNOSIS — R110 Nausea: Secondary | ICD-10-CM | POA: Diagnosis not present

## 2012-11-07 DIAGNOSIS — R109 Unspecified abdominal pain: Secondary | ICD-10-CM

## 2012-11-07 DIAGNOSIS — N281 Cyst of kidney, acquired: Secondary | ICD-10-CM | POA: Insufficient documentation

## 2012-11-07 LAB — HEPATIC FUNCTION PANEL
ALT: 18 U/L (ref 0–53)
AST: 21 U/L (ref 0–37)
Albumin: 4.1 g/dL (ref 3.5–5.2)

## 2012-11-07 LAB — SEDIMENTATION RATE: Sed Rate: 16 mm/hr (ref 0–22)

## 2012-11-07 LAB — TSH: TSH: 1.72 u[IU]/mL (ref 0.35–5.50)

## 2012-11-07 LAB — CBC WITH DIFFERENTIAL/PLATELET
Basophils Absolute: 0 10*3/uL (ref 0.0–0.1)
Basophils Relative: 0.4 % (ref 0.0–3.0)
Eosinophils Relative: 3.3 % (ref 0.0–5.0)
HCT: 42.3 % (ref 39.0–52.0)
Lymphocytes Relative: 17 % (ref 12.0–46.0)
Lymphs Abs: 1.6 10*3/uL (ref 0.7–4.0)
Monocytes Absolute: 0.9 10*3/uL (ref 0.1–1.0)
Neutrophils Relative %: 70.3 % (ref 43.0–77.0)
Platelets: 247 10*3/uL (ref 150.0–400.0)
RBC: 5.14 Mil/uL (ref 4.22–5.81)
WBC: 9.6 10*3/uL (ref 4.5–10.5)

## 2012-11-07 LAB — BASIC METABOLIC PANEL
BUN: 11 mg/dL (ref 6–23)
Chloride: 104 mEq/L (ref 96–112)
GFR: 90.2 mL/min (ref >60.00)
Glucose, Bld: 98 mg/dL (ref 70–99)
Potassium: 4 mEq/L (ref 3.5–5.1)
Sodium: 139 mEq/L (ref 135–145)

## 2012-11-07 MED ORDER — SUCRALFATE 1 G PO TABS: 1.0000 g | ORAL_TABLET | Freq: Three times a day (TID) | ORAL | Status: DC

## 2012-11-07 NOTE — Telephone Encounter (Signed)
Informed pt of labs to have drawn and I will order Carafate. He has an u/s ordered for today at [facility] at 2:30pm; he was instructed not to eat or drink anything until the test and he stated understanding.

## 2012-11-07 NOTE — Telephone Encounter (Signed)
Pt saw Mike Gip, PA on 10/22/12 for mid abdominal pain, queasiness, and diarrhea at times.  Per Amt: Plan; will increase Nexium to 40 mg by mouth twice daily x2 weeks and also have asked him to take the dicyclomine 10 mg by mouth twice daily over the next 2 weeks. I suggested checking labs but he would like to see how he does with medication first.  He will call in 2 weeks and if symptoms are persisting we'll proceed with labs and perhaps imaging.   Pt calling back today with the same s&s. He still has the pain, he is nauseated and this am had a loose stool followed by a watery stool before I spoke with him. He asked to see Amy, because of your schedule. For some reason you have a 10:45am open today; can we add him on or do you have any advice? Thanks.

## 2012-11-07 NOTE — Telephone Encounter (Signed)
Check cbc and [place] profile,sedcrate and upper ultrasound..ADD carafate suspension pc and qhs...probable functional problem

## 2012-11-10 ENCOUNTER — Telehealth: Payer: Self-pay | Admitting: Gastroenterology

## 2012-11-10 DIAGNOSIS — J309 Allergic rhinitis, unspecified: Secondary | ICD-10-CM | POA: Diagnosis not present

## 2012-11-10 NOTE — Telephone Encounter (Signed)
Informed pt of lab results that were normal and nothing was found on the [name] to account for his problems; Dr [name] suggested her see his PCP. He will continue the Nexium, Carafate and Dicyclomine because they help.

## 2012-11-17 DIAGNOSIS — J309 Allergic rhinitis, unspecified: Secondary | ICD-10-CM | POA: Diagnosis not present

## 2012-11-19 DIAGNOSIS — Z23 Encounter for immunization: Secondary | ICD-10-CM | POA: Diagnosis not present

## 2012-11-19 DIAGNOSIS — K589 Irritable bowel syndrome without diarrhea: Secondary | ICD-10-CM | POA: Diagnosis not present

## 2012-11-19 DIAGNOSIS — K219 Gastro-esophageal reflux disease without esophagitis: Secondary | ICD-10-CM | POA: Diagnosis not present

## 2012-11-19 DIAGNOSIS — F411 Generalized anxiety disorder: Secondary | ICD-10-CM | POA: Diagnosis not present

## 2012-11-24 DIAGNOSIS — J3089 Other allergic rhinitis: Secondary | ICD-10-CM | POA: Diagnosis not present

## 2012-11-24 DIAGNOSIS — J301 Allergic rhinitis due to pollen: Secondary | ICD-10-CM | POA: Diagnosis not present

## 2012-11-24 DIAGNOSIS — J3081 Allergic rhinitis due to animal (cat) (dog) hair and dander: Secondary | ICD-10-CM | POA: Diagnosis not present

## 2012-11-24 DIAGNOSIS — J45909 Unspecified asthma, uncomplicated: Secondary | ICD-10-CM | POA: Diagnosis not present

## 2012-11-27 ENCOUNTER — Ambulatory Visit (INDEPENDENT_AMBULATORY_CARE_PROVIDER_SITE_OTHER): Payer: Medicare Other | Admitting: Pharmacist Clinician (PhC)/ Clinical Pharmacy Specialist

## 2012-11-27 VITALS — BP 104/56 | HR 56

## 2012-11-27 DIAGNOSIS — Z7901 Long term (current) use of anticoagulants: Secondary | ICD-10-CM | POA: Diagnosis not present

## 2012-11-27 DIAGNOSIS — I4891 Unspecified atrial fibrillation: Secondary | ICD-10-CM

## 2012-11-27 LAB — POCT INR: INR: 1.8

## 2012-12-01 DIAGNOSIS — J309 Allergic rhinitis, unspecified: Secondary | ICD-10-CM | POA: Diagnosis not present

## 2012-12-03 DIAGNOSIS — R972 Elevated prostate specific antigen [PSA]: Secondary | ICD-10-CM | POA: Diagnosis not present

## 2012-12-08 DIAGNOSIS — J309 Allergic rhinitis, unspecified: Secondary | ICD-10-CM | POA: Diagnosis not present

## 2012-12-09 DIAGNOSIS — N139 Obstructive and reflux uropathy, unspecified: Secondary | ICD-10-CM | POA: Diagnosis not present

## 2012-12-09 DIAGNOSIS — N401 Enlarged prostate with lower urinary tract symptoms: Secondary | ICD-10-CM | POA: Diagnosis not present

## 2012-12-09 DIAGNOSIS — N529 Male erectile dysfunction, unspecified: Secondary | ICD-10-CM | POA: Diagnosis not present

## 2012-12-10 ENCOUNTER — Telehealth (HOSPITAL_COMMUNITY): Payer: Self-pay | Admitting: *Deleted

## 2012-12-11 ENCOUNTER — Ambulatory Visit (INDEPENDENT_AMBULATORY_CARE_PROVIDER_SITE_OTHER): Payer: Medicare Other | Admitting: Gastroenterology

## 2012-12-11 ENCOUNTER — Other Ambulatory Visit: Payer: Self-pay | Admitting: Cardiovascular Disease

## 2012-12-11 ENCOUNTER — Encounter: Payer: Self-pay | Admitting: Gastroenterology

## 2012-12-11 VITALS — BP 118/78 | HR 50 | Ht 67.0 in | Wt 159.2 lb

## 2012-12-11 DIAGNOSIS — R1013 Epigastric pain: Secondary | ICD-10-CM | POA: Diagnosis not present

## 2012-12-11 DIAGNOSIS — K3189 Other diseases of stomach and duodenum: Secondary | ICD-10-CM | POA: Diagnosis not present

## 2012-12-11 DIAGNOSIS — R109 Unspecified abdominal pain: Secondary | ICD-10-CM

## 2012-12-11 DIAGNOSIS — G8929 Other chronic pain: Secondary | ICD-10-CM

## 2012-12-11 DIAGNOSIS — K30 Functional dyspepsia: Secondary | ICD-10-CM

## 2012-12-11 DIAGNOSIS — Z7901 Long term (current) use of anticoagulants: Secondary | ICD-10-CM

## 2012-12-11 MED ORDER — ESOMEPRAZOLE MAGNESIUM 40 MG PO CPDR: 40.0000 mg | DELAYED_RELEASE_CAPSULE | Freq: Two times a day (BID) | ORAL | Status: DC

## 2012-12-11 MED ORDER — DICYCLOMINE HCL 10 MG PO CAPS: 10.0000 mg | ORAL_CAPSULE | Freq: Two times a day (BID) | ORAL | Status: DC | PRN

## 2012-12-11 NOTE — Progress Notes (Signed)
This is a 72 year old Caucasian male with chronic cardiovascular disease requiring defibrillator implantation and chronic anticoagulation for various arrhythmias , previous bypass surgery, history of chronic cardiomyopathy, atrial fibrillation and ventricular tachycardia. These problems all listed in his problem list and reviewed today. He currently is fairly asymptomatic in terms of cardiovascular symptoms, but is on multiple medications. He has a long history of epigastric distress with negative previous endoscopies and recent ultrasound that was normal. He describes a" queasiness" his epigastric area without real relationship to food. There is no nausea vomiting, anorexia, weight loss, or specific hepatobiliary symptoms. He does not abuse alcohol, cigarettes, or NSAIDs. He does take aspirin 81 mg a day. For over 20 years he has had this problem, and was previously treated for H. pylori infection many years ago with followup endoscopy which was unremarkable with a negative biopsy for H. pylori. The patient has refused colonoscopy for many years, and denies lower gastrointestinal problems. CT colonoscopy was ordered in February of 2012, but was not completed by the patient. Family history is noncontributory. His current epigastric abdominal pain is markedly improved on dicyclomine 10 mg twice a day, Nexium one to 2 times a day, and when necessary Carafate suspension. Review of his recent ultrasound exam and multiple labs were all normal. Pancreatic visualization was limited however. There were asymptomatic left renal cysts noted. Chart review, there is no history of hepatitis or pancreatitis.  Current Medications, Allergies, Past Medical History, Past Surgical History, Family History and Social History were reviewed in [employer] record.  ROS: All systems were reviewed and are negative unless otherwise stated in the HPI.          Physical Exam: Healthy-appearing patient in no  acute distress. Blood pressure 118/78, pulse 50 and regular and weight 159 with a BMI of 24.93. Chest is generally clear he appears to be in a regular rhythm without murmurs gallops or rubs. Abdominal exam shows no organomegaly, masses, tenderness, and bowel sounds are normal without a succussion splash. 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 He denies lower GI complaints at this time. Recent repeat evaluation is been unremarkable. He has not been felt to be a good candidate for any type of endoscopic procedures, and he has refused these procedures in any case. I have ordered a Cologuard stool tests for DNA analysis, also stool H. pylori antigen to see if he perhaps has recurrent C. difficile infection. We'll continue all his above-mentioned medications as prescribed with office followup as needed. He is going to try Nexium 40 mg twice a day for a few weeks to see if this makes a difference.

## 2012-12-11 NOTE — Patient Instructions (Signed)
Please increase your Nexium to twice daily, samples given today   Your physician has requested that you go to the basement for the following lab work before leaving today: Stool test

## 2012-12-11 NOTE — Telephone Encounter (Signed)
Rx was sent to pharmacy electronically. 

## 2012-12-12 DIAGNOSIS — J309 Allergic rhinitis, unspecified: Secondary | ICD-10-CM | POA: Diagnosis not present

## 2012-12-15 DIAGNOSIS — J309 Allergic rhinitis, unspecified: Secondary | ICD-10-CM | POA: Diagnosis not present

## 2012-12-16 ENCOUNTER — Other Ambulatory Visit: Payer: Medicare Other

## 2012-12-16 DIAGNOSIS — R109 Unspecified abdominal pain: Secondary | ICD-10-CM | POA: Diagnosis not present

## 2012-12-17 DIAGNOSIS — J309 Allergic rhinitis, unspecified: Secondary | ICD-10-CM | POA: Diagnosis not present

## 2012-12-17 LAB — HELICOBACTER PYLORI  SPECIAL ANTIGEN: H. PYLORI Antigen: NEGATIVE

## 2012-12-18 ENCOUNTER — Ambulatory Visit (INDEPENDENT_AMBULATORY_CARE_PROVIDER_SITE_OTHER): Payer: Medicare Other | Admitting: Pharmacist Clinician (PhC)/ Clinical Pharmacy Specialist

## 2012-12-18 VITALS — BP 120/66 | HR 56

## 2012-12-18 DIAGNOSIS — Z7901 Long term (current) use of anticoagulants: Secondary | ICD-10-CM | POA: Diagnosis not present

## 2012-12-18 DIAGNOSIS — I4891 Unspecified atrial fibrillation: Secondary | ICD-10-CM

## 2012-12-18 LAB — POCT INR: INR: 1.7

## 2012-12-23 DIAGNOSIS — J309 Allergic rhinitis, unspecified: Secondary | ICD-10-CM | POA: Diagnosis not present

## 2012-12-29 ENCOUNTER — Telehealth: Payer: Self-pay | Admitting: Cardiovascular Disease

## 2012-12-29 ENCOUNTER — Encounter: Payer: Self-pay | Admitting: Cardiology

## 2012-12-29 ENCOUNTER — Ambulatory Visit (INDEPENDENT_AMBULATORY_CARE_PROVIDER_SITE_OTHER): Payer: Medicare Other | Admitting: Cardiology

## 2012-12-29 VITALS — BP 120/70 | HR 62 | Ht 67.0 in | Wt 160.0 lb

## 2012-12-29 DIAGNOSIS — J309 Allergic rhinitis, unspecified: Secondary | ICD-10-CM | POA: Diagnosis not present

## 2012-12-29 DIAGNOSIS — I2589 Other forms of chronic ischemic heart disease: Secondary | ICD-10-CM | POA: Diagnosis not present

## 2012-12-29 DIAGNOSIS — I493 Ventricular premature depolarization: Secondary | ICD-10-CM

## 2012-12-29 DIAGNOSIS — I4891 Unspecified atrial fibrillation: Secondary | ICD-10-CM

## 2012-12-29 DIAGNOSIS — I4949 Other premature depolarization: Secondary | ICD-10-CM | POA: Diagnosis not present

## 2012-12-29 DIAGNOSIS — I480 Paroxysmal atrial fibrillation: Secondary | ICD-10-CM

## 2012-12-29 DIAGNOSIS — R002 Palpitations: Secondary | ICD-10-CM

## 2012-12-29 DIAGNOSIS — Z95810 Presence of automatic (implantable) cardiac defibrillator: Secondary | ICD-10-CM

## 2012-12-29 LAB — ICD DEVICE OBSERVATION

## 2012-12-29 NOTE — Progress Notes (Signed)
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 In September of 1998 he underwent CABG revascularization surgery after an unsuccessful attempt at stenting his proximal LAD by Dr. Juanda Chance. Ejection fraction was 25-30%. In 2002 he underwent initial ICD implantation for nonsustained VT documented on event monitor for primary prevention. In January 2010 he underwent a generator change and has now a Medtronic Virtuoso single-chamber cardioverter defibrillator. Additional problems include paroxysmal atrial fibrillation and occasional PVCs. There was an attempt to reduce his carvedilol dose to overdrive suppress his PVCs, but apparently the patient felt he had more PVCs on the reduced dose and therefore he has been back on the higher dose. The patient's last echo was 2011- EF 25-35%. He has not had problems with CHF and he has been active. He walks for an hour every day.He recently underwent a 2-year followup nuclear perfusion scan on December 05, 2011. This showed a large area of scar in the LAD territory. The scan was not gated.  There was no ischemia.          He presents to the office today as an add on for palpitations. He describes extra beats that started today after his walk. He denies any sustained tachycardia. He became concerned and started checking his pulse frequently. He has noted several "pauses". He denies syncope. His ICD was interrogated today and showed PVCs. He is in NSR.    Current Outpatient Prescriptions  Medication Sig Dispense Refill  . acetaminophen (TYLENOL) 650 MG CR tablet Take 650 mg by mouth 2 (two) times daily.       Marland Kitchen allopurinol (ZYLOPRIM) 300 MG tablet Take 150 mg by mouth daily.       Marland Kitchen aspirin 81 MG tablet Take 81 mg by mouth every other day.        . Azelastine HCl 0.15 % SOLN       . carvedilol (COREG) 25 MG tablet Take 25 mg by mouth 2 (two) times daily with a meal.      . cetirizine (ZYRTEC) 10 MG tablet Take 10 mg by mouth daily.        Marland Kitchen dicyclomine (BENTYL) 10 MG capsule Take 1 capsule (10 mg total) by mouth 2 (two) times daily as needed.  90 capsule  2  . digoxin (LANOXIN) 0.125 MG tablet Take 0.5 tablets (62.5 mcg total) by mouth daily.  90 tablet  3  . esomeprazole (NEXIUM) 40 MG capsule Take 1 capsule (40 mg total) by mouth 2 (two) times daily before a meal.  60 capsule  11  . fish oil-omega-3 fatty acids 1000 MG capsule Take 1 g by mouth daily.      . isosorbide mononitrate (IMDUR) 30 MG 24 hr tablet TAKE ONE-HALF TO ONE TABLET BY MOUTH EVERY DAY  90 tablet  3  . lisinopril (PRINIVIL) 20 MG tablet Take 10 mg by mouth 2 (two) times daily.       Marland Kitchen LORazepam (ATIVAN) 1 MG tablet       . mometasone (ASMANEX) 220 MCG/INH inhaler Inhale 1 puff into the lungs daily.       . mometasone (NASONEX) 50 MCG/ACT nasal spray Place 2 sprays into the nose daily.      . montelukast (SINGULAIR) 10 MG tablet Take 10 mg by  mouth at bedtime.       . nitroGLYCERIN (NITROSTAT) 0.4 MG SL tablet Place 0.4 mg under the tongue every 5 (five) minutes as needed for chest pain.      . silodosin (RAPAFLO) 4 MG CAPS capsule Take 8 mg by mouth. Every 2 days      . simvastatin (ZOCOR) 40 MG tablet Take 40 mg by mouth daily.        Marland Kitchen spironolactone (ALDACTONE) 25 MG tablet Take 0.5 tablets (12.5 mg total) by mouth daily.  90 tablet  3  . sucralfate (CARAFATE) 1 G tablet Take 1 tablet (1 g total) by mouth 4 (four) times daily -  before meals and at bedtime.  30 tablet  1  . vitamin B-12 (CYANOCOBALAMIN) 50 MCG tablet Take 50 mcg by mouth daily.      Marland Kitchen warfarin (COUMADIN) 5 MG tablet Take 2.5-5 mg by mouth daily. Takes 5mg  on Sunday, Monday, Wednesday, and Friday. Takes 2.5mg  on Tuesday, Thursday, and Saturday       No current facility-administered medications for  this visit.    Allergies  Allergen Reactions  . Amoxicillin     REACTION: unspecified  . Penicillins     REACTION: unspecified    History   Social History  . Marital Status: Married    Spouse Name: N/A    Number of Children: 3  . Years of Education: N/A   Occupational History  . slef employed semi retired    Social History Main Topics  . Smoking status: Never Smoker   . Smokeless tobacco: Never Used  . Alcohol Use: Yes     Comment: socially  . Drug Use: No  . Sexual Activity: Not on file   Other Topics Concern  . Not on file   Social History Narrative  . No narrative on file     Review of Systems: General: negative for chills, fever, night sweats or weight changes.  Cardiovascular: negative for chest pain, dyspnea on exertion, edema, orthopnea, palpitations, paroxysmal nocturnal dyspnea or shortness of breath Dermatological: negative for rash Respiratory: negative for cough or wheezing Urologic: negative for hematuria Abdominal: negative for nausea, vomiting, diarrhea, bright red blood per rectum, melena, or hematemesis Neurologic: negative for visual changes, syncope, or dizziness All other systems reviewed and are otherwise negative except as noted above.    Blood pressure 120/70, pulse 62, height 5\' 7"  (1.702 m), weight 160 lb (72.576 kg).  General appearance: alert, cooperative and no distress Lungs: clear to auscultation bilaterally Heart: regular rate and rhythm  EKG NSR  ASSESSMENT AND PLAN:   Palpitations Added on today for palpitations  PVC (premature ventricular contraction) Documented on ICD check today  PAF (paroxysmal atrial fibrillation) NSR today  CARDIOMYOPATHY, ISCHEMIC- last EF 25-35% echo 2011 EF not gated on Myoview Nov 2013  ICD- MDT '02 with gen change Jan 2010 MDT Jan 2010   PLAN  He is to get an echo in a week. He will follow up with Dr Tresa Endo after this. I tried to reassure him. I did not change his medications. We  discussed over the counter medications that could cause him problems and he does not use these.  Luetta Piazza KPA-C 12/29/2012 4:14 PM

## 2012-12-29 NOTE — Assessment & Plan Note (Signed)
MDT Jan 2010

## 2012-12-29 NOTE — Patient Instructions (Signed)
Your physician recommends that you schedule a follow-up appointment in: one month with Dr Tresa Endo

## 2012-12-29 NOTE — Assessment & Plan Note (Signed)
EF not gated on Myoview Nov 2013

## 2012-12-29 NOTE — Assessment & Plan Note (Addendum)
Documented on ICD check today

## 2012-12-29 NOTE — Telephone Encounter (Signed)
I spoke with patient.  He complains of an irregular heartbeat that started earlier today.  He is also SOB.  I commented on his history of Afib, he acknowledged that and said that this "just doesn't feel right."   I made an appointment for him to be seen today.

## 2012-12-29 NOTE — Assessment & Plan Note (Signed)
NSR today 

## 2012-12-29 NOTE — Assessment & Plan Note (Signed)
Added on today for palpitations

## 2012-12-29 NOTE — Telephone Encounter (Signed)
Please call -having irregular heart beat,really fast and a little SOB. He thinks he might need to be seen today.

## 2012-12-30 DIAGNOSIS — H5340 Unspecified visual field defects: Secondary | ICD-10-CM | POA: Diagnosis not present

## 2012-12-30 DIAGNOSIS — H468 Other optic neuritis: Secondary | ICD-10-CM | POA: Diagnosis not present

## 2012-12-31 ENCOUNTER — Telehealth: Payer: Self-pay | Admitting: Gastroenterology

## 2012-12-31 ENCOUNTER — Encounter: Payer: Self-pay | Admitting: Cardiology

## 2012-12-31 NOTE — Telephone Encounter (Signed)
Pt reports he received the Colguard Kit and he wants to know if we ordered it? Dr [name], did we order this? Thanks.

## 2013-01-01 ENCOUNTER — Ambulatory Visit (INDEPENDENT_AMBULATORY_CARE_PROVIDER_SITE_OTHER): Payer: Medicare Other | Admitting: Pharmacist Clinician (PhC)/ Clinical Pharmacy Specialist

## 2013-01-01 ENCOUNTER — Ambulatory Visit (HOSPITAL_COMMUNITY)
Admission: RE | Admit: 2013-01-01 | Discharge: 2013-01-01 | Disposition: A | Discharge status: Home / Self Care | Payer: Medicare Other | Source: Ambulatory Visit | Attending: Cardiovascular Disease | Admitting: Cardiovascular Disease

## 2013-01-01 VITALS — BP 120/68 | HR 60

## 2013-01-01 DIAGNOSIS — Z95810 Presence of automatic (implantable) cardiac defibrillator: Secondary | ICD-10-CM | POA: Diagnosis not present

## 2013-01-01 DIAGNOSIS — Z7901 Long term (current) use of anticoagulants: Secondary | ICD-10-CM | POA: Diagnosis not present

## 2013-01-01 DIAGNOSIS — I480 Paroxysmal atrial fibrillation: Secondary | ICD-10-CM

## 2013-01-01 DIAGNOSIS — I4891 Unspecified atrial fibrillation: Secondary | ICD-10-CM | POA: Insufficient documentation

## 2013-01-01 DIAGNOSIS — I2589 Other forms of chronic ischemic heart disease: Secondary | ICD-10-CM | POA: Insufficient documentation

## 2013-01-01 LAB — POCT INR: INR: 2.4

## 2013-01-01 NOTE — Progress Notes (Signed)
2D Echo Performed 01/01/2013    [name], RCS  

## 2013-01-05 DIAGNOSIS — J309 Allergic rhinitis, unspecified: Secondary | ICD-10-CM | POA: Diagnosis not present

## 2013-01-08 ENCOUNTER — Other Ambulatory Visit: Payer: Self-pay | Admitting: Cardiovascular Disease

## 2013-01-08 NOTE — Telephone Encounter (Signed)
Opened in error

## 2013-01-08 NOTE — Telephone Encounter (Signed)
Rx was sent to pharmacy electronically. 

## 2013-01-13 DIAGNOSIS — J309 Allergic rhinitis, unspecified: Secondary | ICD-10-CM | POA: Diagnosis not present

## 2013-01-18 ENCOUNTER — Encounter: Payer: Self-pay | Admitting: *Deleted

## 2013-01-20 DIAGNOSIS — J309 Allergic rhinitis, unspecified: Secondary | ICD-10-CM | POA: Diagnosis not present

## 2013-01-26 ENCOUNTER — Other Ambulatory Visit: Payer: Self-pay | Admitting: *Deleted

## 2013-01-26 ENCOUNTER — Encounter: Payer: Self-pay | Admitting: Internal Medicine

## 2013-01-26 ENCOUNTER — Ambulatory Visit (INDEPENDENT_AMBULATORY_CARE_PROVIDER_SITE_OTHER): Payer: Medicare Other | Admitting: *Deleted

## 2013-01-26 DIAGNOSIS — I498 Other specified cardiac arrhythmias: Secondary | ICD-10-CM | POA: Diagnosis not present

## 2013-01-26 DIAGNOSIS — I472 Ventricular tachycardia, unspecified: Secondary | ICD-10-CM | POA: Diagnosis not present

## 2013-01-26 DIAGNOSIS — I5022 Chronic systolic (congestive) heart failure: Secondary | ICD-10-CM

## 2013-01-26 DIAGNOSIS — I4729 Other ventricular tachycardia: Secondary | ICD-10-CM | POA: Diagnosis not present

## 2013-01-26 DIAGNOSIS — R001 Bradycardia, unspecified: Secondary | ICD-10-CM

## 2013-01-26 LAB — MDC_IDC_ENUM_SESS_TYPE_REMOTE
Battery Voltage: 3.07 V
Brady Statistic RV Percent Paced: 0.43 %
Date Time Interrogation Session: 20141229083526
HighPow Impedance: 48 Ohm
HighPow Impedance: 70 Ohm
Lead Channel Impedance Value: 1104 Ohm
Lead Channel Sensing Intrinsic Amplitude: 19.3024
Lead Channel Setting Pacing Amplitude: 2.5 V
MDC IDC SET LEADCHNL RV PACING PULSEWIDTH: 0.4 ms
MDC IDC SET LEADCHNL RV SENSING SENSITIVITY: 0.3 mV
MDC IDC SET ZONE DETECTION INTERVAL: 300 ms
MDC IDC SET ZONE DETECTION INTERVAL: 340 ms
MDC IDC SET ZONE DETECTION INTERVAL: 450 ms
Zone Setting Detection Interval: 240 ms

## 2013-01-26 MED ORDER — LISINOPRIL 20 MG PO TABS: 10.0000 mg | ORAL_TABLET | Freq: Two times a day (BID) | ORAL | Status: DC

## 2013-01-27 DIAGNOSIS — J309 Allergic rhinitis, unspecified: Secondary | ICD-10-CM | POA: Diagnosis not present

## 2013-01-30 ENCOUNTER — Ambulatory Visit (INDEPENDENT_AMBULATORY_CARE_PROVIDER_SITE_OTHER): Payer: Medicare Other | Admitting: Pharmacist Clinician (PhC)/ Clinical Pharmacy Specialist

## 2013-01-30 VITALS — BP 122/66 | HR 48

## 2013-01-30 DIAGNOSIS — Z7901 Long term (current) use of anticoagulants: Secondary | ICD-10-CM

## 2013-01-30 DIAGNOSIS — I4891 Unspecified atrial fibrillation: Secondary | ICD-10-CM | POA: Diagnosis not present

## 2013-01-30 DIAGNOSIS — Z5181 Encounter for therapeutic drug level monitoring: Secondary | ICD-10-CM

## 2013-01-30 DIAGNOSIS — I480 Paroxysmal atrial fibrillation: Secondary | ICD-10-CM

## 2013-01-30 LAB — POCT INR: INR: 2.3

## 2013-02-03 DIAGNOSIS — J309 Allergic rhinitis, unspecified: Secondary | ICD-10-CM | POA: Diagnosis not present

## 2013-02-04 ENCOUNTER — Ambulatory Visit: Payer: Medicare Other | Admitting: Cardiovascular Disease

## 2013-02-09 DIAGNOSIS — J309 Allergic rhinitis, unspecified: Secondary | ICD-10-CM | POA: Diagnosis not present

## 2013-02-10 ENCOUNTER — Ambulatory Visit (INDEPENDENT_AMBULATORY_CARE_PROVIDER_SITE_OTHER): Payer: Medicare Other | Admitting: Cardiovascular Disease

## 2013-02-10 ENCOUNTER — Encounter: Payer: Self-pay | Admitting: Cardiovascular Disease

## 2013-02-10 VITALS — BP 110/70 | HR 52 | Ht 67.0 in | Wt 165.1 lb

## 2013-02-10 DIAGNOSIS — I4729 Other ventricular tachycardia: Secondary | ICD-10-CM

## 2013-02-10 DIAGNOSIS — R5381 Other malaise: Secondary | ICD-10-CM | POA: Diagnosis not present

## 2013-02-10 DIAGNOSIS — Z79899 Other long term (current) drug therapy: Secondary | ICD-10-CM

## 2013-02-10 DIAGNOSIS — I472 Ventricular tachycardia, unspecified: Secondary | ICD-10-CM

## 2013-02-10 DIAGNOSIS — I2589 Other forms of chronic ischemic heart disease: Secondary | ICD-10-CM

## 2013-02-10 DIAGNOSIS — I4891 Unspecified atrial fibrillation: Secondary | ICD-10-CM

## 2013-02-10 DIAGNOSIS — I5022 Chronic systolic (congestive) heart failure: Secondary | ICD-10-CM

## 2013-02-10 DIAGNOSIS — E785 Hyperlipidemia, unspecified: Secondary | ICD-10-CM | POA: Diagnosis not present

## 2013-02-10 DIAGNOSIS — I480 Paroxysmal atrial fibrillation: Secondary | ICD-10-CM

## 2013-02-10 DIAGNOSIS — R5383 Other fatigue: Secondary | ICD-10-CM | POA: Diagnosis not present

## 2013-02-10 DIAGNOSIS — I2510 Atherosclerotic heart disease of native coronary artery without angina pectoris: Secondary | ICD-10-CM | POA: Diagnosis not present

## 2013-02-10 DIAGNOSIS — R002 Palpitations: Secondary | ICD-10-CM

## 2013-02-10 NOTE — Patient Instructions (Signed)
Your physician recommends that you return for lab work fasting.  Your physician recommends that you schedule a follow-up appointment in:6 MONTHS.

## 2013-02-12 ENCOUNTER — Ambulatory Visit (INDEPENDENT_AMBULATORY_CARE_PROVIDER_SITE_OTHER): Payer: Medicare Other | Admitting: Gastroenterology

## 2013-02-12 ENCOUNTER — Encounter: Payer: Self-pay | Admitting: *Deleted

## 2013-02-12 ENCOUNTER — Encounter: Payer: Self-pay | Admitting: Gastroenterology

## 2013-02-12 VITALS — BP 108/60 | HR 68 | Ht 67.0 in | Wt 163.0 lb

## 2013-02-12 DIAGNOSIS — K3189 Other diseases of stomach and duodenum: Secondary | ICD-10-CM

## 2013-02-12 DIAGNOSIS — R1013 Epigastric pain: Secondary | ICD-10-CM | POA: Diagnosis not present

## 2013-02-12 DIAGNOSIS — K30 Functional dyspepsia: Secondary | ICD-10-CM

## 2013-02-12 NOTE — Patient Instructions (Signed)
We have given you samples of the following medication to take: Probiotic VSL # 3, please take one capsule by mouth once daily for thirty days(PLEASE KEEP IN THE REFRIGERATOR)  You will receive a call from [facility] regarding your Cologuard Test.

## 2013-02-12 NOTE — Progress Notes (Signed)
History of Present Illness: This is a 73 year old Caucasian male with chronic bleeding he has some vague epigastric discomfort all felt to be functional in nature.  He is refused endoscopic exams because of his anticoagulated status related to his cardiomyopathy.  I strongly encouraged him to do Cologuard  sample which he has not done.  He denies melena, hematochezia, anorexia or weight loss.  His symptoms are no different than they've been for the last 20 years.    Current Medications, Allergies, Past Medical History, Past Surgical History, Family History and Social History were reviewed in [employer] record.  ROS: All systems were reviewed and are negative unless otherwise stated in the HPI.         Assessment and plan: Chronic functional GI complaints and a patient with multiple cardiovascular issues in chronically anticoagulated.  He seems to do well on PPI medications and when necessary antacid spasmodic.  Have urged him to return stool tests for DNA exam, and we'll give him a one-month trial of VSl#3 to see if this may help his chronic functional dyspepsia.  Again now turned over to Dr. Hilarie Fredrickson, is currently very stable from his cardiovascular problems. for long-term care upon retirement

## 2013-02-16 DIAGNOSIS — J309 Allergic rhinitis, unspecified: Secondary | ICD-10-CM | POA: Diagnosis not present

## 2013-02-17 DIAGNOSIS — I4891 Unspecified atrial fibrillation: Secondary | ICD-10-CM | POA: Diagnosis not present

## 2013-02-17 DIAGNOSIS — Z1211 Encounter for screening for malignant neoplasm of colon: Secondary | ICD-10-CM | POA: Diagnosis not present

## 2013-02-17 DIAGNOSIS — Z79899 Other long term (current) drug therapy: Secondary | ICD-10-CM | POA: Diagnosis not present

## 2013-02-17 DIAGNOSIS — R5383 Other fatigue: Secondary | ICD-10-CM | POA: Diagnosis not present

## 2013-02-17 DIAGNOSIS — R5381 Other malaise: Secondary | ICD-10-CM | POA: Diagnosis not present

## 2013-02-17 DIAGNOSIS — E785 Hyperlipidemia, unspecified: Secondary | ICD-10-CM | POA: Diagnosis not present

## 2013-02-17 LAB — CBC
HEMATOCRIT: 43.9 % (ref 39.0–52.0)
HEMOGLOBIN: 14.8 g/dL (ref 13.0–17.0)
MCH: 27.9 pg (ref 26.0–34.0)
MCHC: 33.7 g/dL (ref 30.0–36.0)
MCV: 82.8 fL (ref 78.0–100.0)
Platelets: 253 10*3/uL (ref 150–400)
RBC: 5.3 MIL/uL (ref 4.22–5.81)
RDW: 14.2 % (ref 11.5–15.5)
WBC: 7.9 10*3/uL (ref 4.0–10.5)

## 2013-02-17 LAB — COMPREHENSIVE METABOLIC PANEL
ALT: 16 U/L (ref 0–53)
AST: 18 U/L (ref 0–37)
Albumin: 4.1 g/dL (ref 3.5–5.2)
Alkaline Phosphatase: 45 U/L (ref 39–117)
BUN: 14 mg/dL (ref 6–23)
CALCIUM: 9.2 mg/dL (ref 8.4–10.5)
CHLORIDE: 104 meq/L (ref 96–112)
CO2: 28 meq/L (ref 19–32)
CREATININE: 0.84 mg/dL (ref 0.50–1.35)
Glucose, Bld: 89 mg/dL (ref 70–99)
Potassium: 4.1 mEq/L (ref 3.5–5.3)
Sodium: 137 mEq/L (ref 135–145)
Total Bilirubin: 0.7 mg/dL (ref 0.3–1.2)
Total Protein: 6.9 g/dL (ref 6.0–8.3)

## 2013-02-17 LAB — LIPID PANEL
CHOLESTEROL: 127 mg/dL (ref 0–200)
HDL: 34 mg/dL — ABNORMAL LOW (ref >39)
LDL Cholesterol: 67 mg/dL (ref 0–99)
Total CHOL/HDL Ratio: 3.7 Ratio
Triglycerides: 130 mg/dL (ref <150)
VLDL: 26 mg/dL (ref 0–40)

## 2013-02-17 LAB — COLOGUARD

## 2013-02-18 LAB — VITAMIN D 25 HYDROXY (VIT D DEFICIENCY, FRACTURES): VIT D 25 HYDROXY: 33 ng/mL (ref 30–89)

## 2013-02-18 LAB — DIGOXIN LEVEL: Digoxin Level: 0.3 ng/mL — ABNORMAL LOW (ref 0.8–2.0)

## 2013-02-23 ENCOUNTER — Telehealth: Payer: Self-pay | Admitting: *Deleted

## 2013-02-23 NOTE — Telephone Encounter (Signed)
Pt was calling in regards to his blood test that he had a week ago. He wants a copy.  TK

## 2013-02-24 ENCOUNTER — Telehealth: Payer: Self-pay | Admitting: Cardiovascular Disease

## 2013-02-24 ENCOUNTER — Ambulatory Visit (INDEPENDENT_AMBULATORY_CARE_PROVIDER_SITE_OTHER): Payer: Medicare Other | Admitting: Pharmacist Clinician (PhC)/ Clinical Pharmacy Specialist

## 2013-02-24 ENCOUNTER — Encounter: Payer: Self-pay | Admitting: Physician Assistant

## 2013-02-24 ENCOUNTER — Ambulatory Visit (INDEPENDENT_AMBULATORY_CARE_PROVIDER_SITE_OTHER): Payer: Medicare Other | Admitting: *Deleted

## 2013-02-24 ENCOUNTER — Ambulatory Visit (INDEPENDENT_AMBULATORY_CARE_PROVIDER_SITE_OTHER): Payer: Medicare Other | Admitting: Physician Assistant

## 2013-02-24 VITALS — BP 110/60 | HR 84 | Ht 67.0 in | Wt 160.0 lb

## 2013-02-24 DIAGNOSIS — E785 Hyperlipidemia, unspecified: Secondary | ICD-10-CM | POA: Diagnosis not present

## 2013-02-24 DIAGNOSIS — I480 Paroxysmal atrial fibrillation: Secondary | ICD-10-CM

## 2013-02-24 DIAGNOSIS — I4891 Unspecified atrial fibrillation: Secondary | ICD-10-CM

## 2013-02-24 DIAGNOSIS — Z7901 Long term (current) use of anticoagulants: Secondary | ICD-10-CM | POA: Diagnosis not present

## 2013-02-24 DIAGNOSIS — I2589 Other forms of chronic ischemic heart disease: Secondary | ICD-10-CM

## 2013-02-24 DIAGNOSIS — Z95810 Presence of automatic (implantable) cardiac defibrillator: Secondary | ICD-10-CM

## 2013-02-24 LAB — MDC_IDC_ENUM_SESS_TYPE_INCLINIC
Brady Statistic RV Percent Paced: 0.5 %
HIGH POWER IMPEDANCE MEASURED VALUE: 48 Ohm
HIGH POWER IMPEDANCE MEASURED VALUE: 66 Ohm
Lead Channel Impedance Value: 1120 Ohm
Lead Channel Sensing Intrinsic Amplitude: 16.6 mV
Lead Channel Setting Pacing Amplitude: 2.5 V
Lead Channel Setting Pacing Pulse Width: 0.4 ms
Lead Channel Setting Sensing Sensitivity: 0.3 mV
MDC IDC MSMT BATTERY VOLTAGE: 3.03 V
MDC IDC SET ZONE DETECTION INTERVAL: 240 ms
MDC IDC SET ZONE DETECTION INTERVAL: 300 ms
MDC IDC SET ZONE DETECTION INTERVAL: 340 ms
Zone Setting Detection Interval: 450 ms

## 2013-02-24 LAB — ICD DEVICE OBSERVATION

## 2013-02-24 LAB — POCT INR: INR: 2.7

## 2013-02-24 NOTE — Assessment & Plan Note (Signed)
INR today of 2.7

## 2013-02-24 NOTE — Telephone Encounter (Signed)
Returned call and pt verified x 2.  Pt stated he woke up this morning and his heart was "racing a mile a minute."  Stated he took his medicine thinking it would help it to slow down.  Stated [name] told him he could take an extra half of Coreg, which he did about 15 mins ago.  Pt denied CP and c/o intermittent SOB.  Pt stated HR is 68.  Pt informed a provider will be notified for further instructions.  Pt stated he just wants to come in.  Appt scheduled for today at 11:20 am w/ Tarri Fuller, PA-C for evaluation.

## 2013-02-24 NOTE — Assessment & Plan Note (Signed)
Patient is currently in atrial fibrillation with a well-controlled rate.  He is on digoxin and Coreg for rate control.  His INR is currently 2.7.  Interrogation of his ICD showed no high ventricular rates.  Continue current medical therapy

## 2013-02-24 NOTE — Assessment & Plan Note (Signed)
Well compensated with no acute signs of heart failure.

## 2013-02-24 NOTE — Progress Notes (Signed)
Date:  02/24/2013   ID:  [name], DOB May 11, 1940, MRN [id number]  PCP:  Jason Frees, MD  Primary Cardiologist:  [name]     History of Present Illness: [name] is a 73 y.o. male who has a history of an ischemic cardiomyopathy and suffered a large anterior wall myocardial infarction in 1992. In September of 1998 he underwent CABG revascularization surgery after an unsuccessful attempt at stenting his proximal LAD by Dr. Olevia [name]. Ejection fraction was 25-30%. In 2002 he underwent initial ICD implantation for nonsustained VT documented on event monitor for primary prevention. In January 2010 he underwent a generator change and has now a Medtronic Virtuoso single-chamber cardioverter defibrillator. Additional problems include paroxysmal atrial fibrillation and occasional PVCs. There was an attempt to reduce his carvedilol dose to overdrive suppress his PVCs, but apparently the patient felt he had more PVCs on the reduced dose and therefore he has been back on the higher dose. The patient's last echo was 2011- EF 25-35%.   He recently underwent a 2-year followup nuclear perfusion scan on December 05, 2011. This showed a large area of scar in the LAD territory. The scan was not gated. There was no ischemia. . Patient is here today to follow up for an irregular heart rate.  He was seen by the [facility] on 12/29/2012 which time you were 2-D echocardiogram.  ICD interrogation at that time showed PVCs.  He walks for an hour five days per week.  Patient reports that at approximately 0500 hours this morning he had woken up and his heart was racing. He does notice a little shortness of breath more than usual but no dizziness.  He states he did take an extra half of a coreg tablet as he was instructed by Dr. Claiborne [name].  The patient currently denies nausea, vomiting, fever, chest pain, orthopnea, PND, cough, congestion, abdominal pain, hematochezia, melena, lower extremity edema, claudication.  Wt Readings  from Last 3 Encounters:  02/24/13 160 lb (72.576 kg)  02/12/13 163 lb (73.936 kg)  02/10/13 165 lb 1.6 oz (74.889 kg)     Past Medical History  Diagnosis Date  . Coronary artery disease     Hx MI 1992, CABG 1998 , Nuc study 11.2013 large scar but no ischemia  . 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 Atrial fib/flutter, transient   . GERD (gastroesophageal reflux disease)   . H. pylori infection     Hx of   . Cardiomyopathy, ischemic 2011    with EF 25-35% by echo  . H/O myocardial infarction, greater than 8 weeks 1992    large ant wall injury  . NSVT (nonsustained ventricular tachycardia)     Current Outpatient Prescriptions  Medication Sig Dispense Refill  . acetaminophen (TYLENOL) 650 MG CR tablet Take 650 mg by mouth 2 (two) times daily.       Marland Kitchen allopurinol (ZYLOPRIM) 300 MG tablet Take 150 mg by mouth daily.       Marland Kitchen aspirin 81 MG tablet Take 81 mg by mouth every other day.       . carvedilol (COREG) 25 MG tablet Take 25 mg by mouth 2 (two) times daily with a meal.      . cetirizine (ZYRTEC) 10 MG tablet Take 10 mg by mouth daily.        Marland Kitchen dicyclomine (BENTYL) 10 MG capsule Take 1 capsule (10 mg total) by mouth 2 (two) times daily as  needed.  90 capsule  2  . digoxin (LANOXIN) 0.125 MG tablet Take 0.5 tablets (62.5 mcg total) by mouth daily.  90 tablet  3  . esomeprazole (NEXIUM) 40 MG capsule Take 1 capsule (40 mg total) by mouth 2 (two) times daily before a meal.  60 capsule  11  . fish oil-omega-3 fatty acids 1000 MG capsule Take 1 g by mouth daily.      . isosorbide mononitrate (IMDUR) 30 MG 24 hr tablet 1/2 tablet daily.      Marland Kitchen lisinopril (PRINIVIL) 20 MG tablet Take 0.5 tablets (10 mg total) by mouth 2 (two) times daily.  90 tablet  3  . LORazepam (ATIVAN) 1 MG tablet Take 1 mg by mouth as needed.       . mometasone (ASMANEX) 220 MCG/INH inhaler Inhale 1 puff into the lungs daily.       . mometasone (NASONEX) 50 MCG/ACT  nasal spray Place 2 sprays into the nose daily.      . montelukast (SINGULAIR) 10 MG tablet Take 10 mg by mouth at bedtime.       . nitroGLYCERIN (NITROSTAT) 0.4 MG SL tablet Place 0.4 mg under the tongue every 5 (five) minutes as needed for chest pain.      . simvastatin (ZOCOR) 40 MG tablet TAKE ONE TABLET BY MOUTH AT BEDTIME  90 tablet  3  . spironolactone (ALDACTONE) 25 MG tablet Take 0.5 tablets (12.5 mg total) by mouth daily.  90 tablet  3  . vitamin B-12 (CYANOCOBALAMIN) 50 MCG tablet Take 50 mcg by mouth daily.      Marland Kitchen warfarin (COUMADIN) 5 MG tablet Take 2.5-5 mg by mouth daily. Takes 5mg  on Sunday, Monday, Wednesday, and Friday. Takes 2.5mg  on Tuesday, Thursday, and Saturday      . silodosin (RAPAFLO) 4 MG CAPS capsule Take 8 mg by mouth. Every 2 days       No current facility-administered medications for this visit.    Allergies:    Allergies  Allergen Reactions  . Amoxicillin     REACTION: unspecified  . Penicillins     REACTION: unspecified    Social History:  The patient  reports that he has never smoked. He has never used smokeless tobacco. He reports that he drinks alcohol. He reports that he does not use illicit drugs.   Family history:   Family History  Problem Relation Age of Onset  . Colon cancer Neg Hx   . Heart disease Father     questionable  . Stroke Mother   . Heart failure Sister   . Healthy Brother   . Healthy Sister     ROS:  Please see the history of present illness.  All other systems reviewed and negative.   PHYSICAL EXAM: VS:  BP 110/60  Pulse 84  Ht 5\' 7"  (1.702 m)  Wt 160 lb (72.576 kg)  BMI 25.05 kg/m2 Well nourished, well developed, in no acute distress HEENT: Pupils are equal round react to light accommodation extraocular movements are intact.  Neck: no JVDNo cervical lymphadenopathy. Cardiac: Irreg rate and rhythm without murmurs rubs or gallops. Lungs:  clear to auscultation bilaterally, no wheezing, rhonchi or rales Ext: no lower  extremity edema.  2+ radial and dorsalis pedis pulses. Skin: warm and dry Neuro:  Grossly normal  EKG:   Atrial fibrillation with a rate of 84 beats per minute  ASSESSMENT AND PLAN:  Problem List Items Addressed This Visit   CARDIOMYOPATHY, ISCHEMIC- last EF  25-35% echo 2011 (Chronic)     Well compensated with no acute signs of heart failure.    PAF (paroxysmal atrial fibrillation) - Primary (Chronic)     Patient is currently in atrial fibrillation with a well-controlled rate.  He is on digoxin and Coreg for rate control.  His INR is currently 2.7.  Interrogation of his ICD showed no high ventricular rates.  Continue current medical therapy    Relevant Orders      EKG 12-Lead   ICD- MDT '02 with gen change Jan 2010 (Chronic)   HYPERLIPIDEMIA     Last lipid panel is 02/17/2013 showed triglycerides of 1:30, total cholesterol 127, HDL 34, LDL 67, total cholesterol to HDL ratio 3.7.    Long term (current) use of anticoagulants     INR today of 2.7

## 2013-02-24 NOTE — Patient Instructions (Signed)
Followup with [name] in 6 months.

## 2013-02-24 NOTE — Telephone Encounter (Signed)
Please call-wants to be seen today if possible, Having a rapid heart beat,started around 3 this morning.

## 2013-02-24 NOTE — Assessment & Plan Note (Signed)
Last lipid panel is 02/17/2013 showed triglycerides of 1:30, total cholesterol 127, HDL 34, LDL 67, total cholesterol to HDL ratio 3.7.

## 2013-02-26 DIAGNOSIS — J309 Allergic rhinitis, unspecified: Secondary | ICD-10-CM | POA: Diagnosis not present

## 2013-02-27 ENCOUNTER — Ambulatory Visit: Payer: Medicare Other | Admitting: Pharmacist Clinician (PhC)/ Clinical Pharmacy Specialist

## 2013-02-27 NOTE — Progress Notes (Signed)
ICD check in clinic with Tarri Fuller, PA for episodes only.  No evidence of any ventricular arrhythmias. Thoracic impedance measurement reviewed and would appear somewhat unstable, but patient without any symptoms. Histogram distribution appropriate for patient and level of activity. No changes made this session. No testing performed this session. Batt voltage 3.03V (ERI 2.62V). Pt enrolled in remote follow-up. Plan to follow up with SK as scheduled.

## 2013-02-27 NOTE — Addendum Note (Signed)
Addended by: Shiela Mayer on: 02/27/2013 02:34 PM   Modules accepted: Level of Service

## 2013-03-02 ENCOUNTER — Ambulatory Visit (INDEPENDENT_AMBULATORY_CARE_PROVIDER_SITE_OTHER): Payer: Medicare Other | Admitting: *Deleted

## 2013-03-02 DIAGNOSIS — Z95810 Presence of automatic (implantable) cardiac defibrillator: Secondary | ICD-10-CM

## 2013-03-02 DIAGNOSIS — I5022 Chronic systolic (congestive) heart failure: Secondary | ICD-10-CM

## 2013-03-03 ENCOUNTER — Telehealth: Payer: Self-pay | Admitting: *Deleted

## 2013-03-03 DIAGNOSIS — J301 Allergic rhinitis due to pollen: Secondary | ICD-10-CM | POA: Diagnosis not present

## 2013-03-03 DIAGNOSIS — J3081 Allergic rhinitis due to animal (cat) (dog) hair and dander: Secondary | ICD-10-CM | POA: Diagnosis not present

## 2013-03-03 DIAGNOSIS — J45909 Unspecified asthma, uncomplicated: Secondary | ICD-10-CM | POA: Diagnosis not present

## 2013-03-03 DIAGNOSIS — J309 Allergic rhinitis, unspecified: Secondary | ICD-10-CM | POA: Diagnosis not present

## 2013-03-03 DIAGNOSIS — J3089 Other allergic rhinitis: Secondary | ICD-10-CM | POA: Diagnosis not present

## 2013-03-03 NOTE — Telephone Encounter (Signed)
LMOM for patient to call back to tell him his Cologuard test was negative

## 2013-03-05 ENCOUNTER — Telehealth: Payer: Self-pay | Admitting: Cardiovascular Disease

## 2013-03-05 ENCOUNTER — Telehealth: Payer: Self-pay | Admitting: Internal Medicine

## 2013-03-05 DIAGNOSIS — I5022 Chronic systolic (congestive) heart failure: Secondary | ICD-10-CM

## 2013-03-05 DIAGNOSIS — Z95810 Presence of automatic (implantable) cardiac defibrillator: Secondary | ICD-10-CM | POA: Diagnosis not present

## 2013-03-05 NOTE — Telephone Encounter (Signed)
Returned call.  Left message to call back before 4pm.  

## 2013-03-05 NOTE — Telephone Encounter (Signed)
Returned call and pt verified x 2.  Pt informed per YFVCBSW and verbalized understanding.  Stated he will send the transmission within the hour and would like a call back.  Pt informed Tobin Chad will be notified.  Pt verbalized understanding and agreed w/ plan.  Message forwarded to S. [name], CMA r/t device/monitor concerns.

## 2013-03-05 NOTE — Telephone Encounter (Signed)
Spoke w/ Tobin Chad, CMA w/ devices and she advised pt perform a remote check so transmission can be reviewed this afternoon.  Will inform pt when he calls back.

## 2013-03-05 NOTE — Progress Notes (Signed)
EPIC Encounter for ICM Monitoring  Patient Name: [name] is a 73 y.o. male Date: 03/05/2013 Primary Care Physican: [name], MD Primary Cardiologist: [name] Electrophysiologist: [name] Dry Weight: 157 lbs       In the past month, have you:  1. Gained more than 2 pounds in a day or more than 5 pounds in a week? no  2. Had changes in your medications (with verification of current medications)? no  3. Had more shortness of breath than is usual for you? no  4. Limited your activity because of shortness of breath? no  5. Not been able to sleep because of shortness of breath? no  6. Had increased swelling in your feet or ankles? no  7. Had symptoms of dehydration (dizziness, dry mouth, increased thirst, decreased urine output) no  8. Had changes in sodium restriction? no  9. Been compliant with medication? Yes   ICM trend:  Follow-up plan: ICM clinic phone appointment 04/06/13. The patient reports feeling like his heart went in to a-fib last night. Confirmed a-fib on EGM with Dr. [name]. Per Dr. [name], the patient can be tentatively scheduled for DCCV for Monday, but it is felt that he will convert on his own prior to that. Elevated optivol addressed by Dr. [name] as well. Instructions for patient to increase spironolactone to 25 mg one whole tablet by mouth for 5 days, then resume 1/2 tablet daily. Dr. Olin Pia recommendations have been reviewed with the patient. He will call back tomorrow to update [name] on how he is feeling prior to scheduling a DCCV. He will increase spironolactone as directed. I will forward a note to triage for when the patient comes back tomorrow.   Copy of note sent to patient's primary care physician, primary cardiologist, and device following physician.  Alvis Lemmings, RN, BSN 03/05/2013 3:44 PM

## 2013-03-05 NOTE — Patient Instructions (Addendum)
Next transmission of your device to monitor fluid levels will be on 04/06/13.   If you have any increased weight gain, shortness of breath, or swelling that you notice prior to that time, please call the office and let me know. I am typically in the office on Mondays and Thursdays from 8am-5pm that you may speak with me directly. Otherwise, you can be forwarded to my voice mail if your concern is not urgent and I will be in touch with you as quick as possible.

## 2013-03-05 NOTE — Telephone Encounter (Signed)
Please call-heart been out of rhythm this morning,need to find out if he needs to come in.

## 2013-03-05 NOTE — Telephone Encounter (Signed)
Returning your call. °

## 2013-03-05 NOTE — Telephone Encounter (Signed)
I called the patient to discuss his elevated optivol level from his transmission from today. He reported a-fib that started last night. Reviewed current EGM with Dr. [name] off of [facility] transmission. A-fib confirmed. Per Dr. [name], since the patient is already anticoagulated on coumadin, we can tentatively schedule for DCCV for Monday with the hope that he will spontaneously convert back to sinus rhythm on his own in the interim. I have reviewed this with the patient. He did not want to schedule DCCV at this time, but will call back tomorrow with how he is feeling. If a-fib persists, we can schedule for DCCV for Monday. If he does need DCCV scheduled, he can call back Monday morning and cancel DCCV if he converts over the weekend. I will forward plan to triage for tomorrow since Sherri and I will both be out of the office tomorrow.

## 2013-03-06 NOTE — Telephone Encounter (Signed)
Patient states he talked to someone this am from Dr. Olin Pia office who called him.  He states he went back to normal rhythm.  He slept better last night and feels well today.  Also he is taking a whole tab of spironolactone instead of 1/2 for the next 5 days per Dr. [name].

## 2013-03-06 NOTE — Telephone Encounter (Signed)
Spoke to patient about 2-5 [facility] which showed irreg V-V. Patient states that he spoke to [facility] yesterday who informed SK about his AF. Patient was supposed to follow up with a DCCV on Monday if he hadn't converted in between times. Patient sent a transmission today which showed that he had in fact converted back to NSR. Patient aware and stated that he could feel the difference. Patient will follow up with SK as scheduled.

## 2013-03-07 ENCOUNTER — Encounter: Payer: Self-pay | Admitting: Cardiovascular Disease

## 2013-03-07 NOTE — Progress Notes (Signed)
Patient ID: [name], male   DOB: 07-20-1940, 73 y.o.   MRN: [id number]      HPI: [name] a 73 y.o. male with a history of an ischemic cardiomyopathy as a consequence of a large anterior wall myocardial infarction in 1992. In September 1998 he underwent CABG revascularization surgery after an unsuccessful attempt at stenting of his proximal LAD by Dr. Maurene Capes. Ejection fraction was 25-30%. In 2002 he underwent initial ICD implantation for nonsustained ventricular tachycardia documented on event monitor for primary prevention. In January 2010 he underwent generator change with a  Medtronic Virtuoso single chamber cardioverter defibrillator. Additional problems include paroxysmal atrial fibrillation, occasional PVCs and an attempt is made in the past 2 overdrive suppress his PVCs with reducing his beta blocker therapy patient felt he had PVCs on the reduced dose and therefore has been on the higher dose. Due to bradycardia, his  Lanoxin dose was also reduced.  His last nuclear perfusion study was in November 2013 which showed a large area of scar in the LAD territory (extent 44%) involving the mid to apical anterior,  apical and infero-apical to mid infero-septal and apical lateral wall without associated ischemia.  When I saw [name] in June 2014 he was complaining of mild tenderness in the breast region. I felt this possibly could be related to his Spironolactone and consequently recommended a trial of Inspra 25 mg to see if this could improve his symptoms. He was seen in the emergency room for presyncope in July and was concerned that his heart was beating erratically. In the emergency room his ICD was interrogated and no arrhythmias were noted. His vital signs were stable. He had  normal troponin. Patient preferred discontinuing the Inspra particularly due to cost and resumed  spironolactone again. He feels that he seems to tolerate this better.   [name] was seen in the office by [name]  on 12/29/2012 with complaint of increased palpitations. He scheduled him for subsequent echo Doppler study which was done on 01/01/2013. His left ventricle was mildly dilated. Ejection fraction was 25-30%. There was dyskinesis in scarring of the apical myocardium as well as akinesis of the mid anteroseptal, anterior and anterolateral walls consistent with his prior LAD infarction. He does have a history of paroxysmal atrial fibrillation. Recently, his view he feels that his heart rhythm is stable. He presents for evaluation.   Past Medical History  Diagnosis Date  . Coronary artery disease     Hx MI 1992, CABG 1998 , Nuc study 11.2013 large scar but no ischemia  . 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 Atrial fib/flutter, transient   . GERD (gastroesophageal reflux disease)   . H. pylori infection     Hx of   . Cardiomyopathy, ischemic 2011    with EF 25-35% by echo  . H/O myocardial infarction, greater than 8 weeks 1992    large ant wall injury  . NSVT (nonsustained ventricular tachycardia)     Past Surgical History  Procedure Laterality Date  . Nasal sinus surgery  05/31/2010    Dr. Benjamine Mola  . Coronary artery bypass graft  1998    x 5  . Cataract extraction, bilateral    . Knee surgery      right  . Hernia repair    . Icd generator change  01/2008    medtronic, hx+ EP study    Allergies  Allergen Reactions  . Amoxicillin  REACTION: unspecified  . Penicillins     REACTION: unspecified    Current Outpatient Prescriptions  Medication Sig Dispense Refill  . acetaminophen (TYLENOL) 650 MG CR tablet Take 650 mg by mouth 2 (two) times daily.       [name] allopurinol (ZYLOPRIM) 300 MG tablet Take 150 mg by mouth daily.       [name] aspirin 81 MG tablet Take 81 mg by mouth every other day.       . carvedilol (COREG) 25 MG tablet Take 25 mg by mouth 2 (two) times daily with a meal.      . cetirizine (ZYRTEC) 10 MG tablet Take 10 mg by mouth  daily.        [name] dicyclomine (BENTYL) 10 MG capsule Take 1 capsule (10 mg total) by mouth 2 (two) times daily as needed.  90 capsule  2  . digoxin (LANOXIN) 0.125 MG tablet Take 0.5 tablets (62.5 mcg total) by mouth daily.  90 tablet  3  . esomeprazole (NEXIUM) 40 MG capsule Take 1 capsule (40 mg total) by mouth 2 (two) times daily before a meal.  60 capsule  11  . fish oil-omega-3 fatty acids 1000 MG capsule Take 1 g by mouth daily.      . isosorbide mononitrate (IMDUR) 30 MG 24 hr tablet 1/2 tablet daily.      [name] lisinopril (PRINIVIL) 20 MG tablet Take 0.5 tablets (10 mg total) by mouth 2 (two) times daily.  90 tablet  3  . LORazepam (ATIVAN) 1 MG tablet Take 1 mg by mouth as needed.       . mometasone (ASMANEX) 220 MCG/INH inhaler Inhale 1 puff into the lungs daily.       . mometasone (NASONEX) 50 MCG/ACT nasal spray Place 2 sprays into the nose daily.      . montelukast (SINGULAIR) 10 MG tablet Take 10 mg by mouth at bedtime.       . nitroGLYCERIN (NITROSTAT) 0.4 MG SL tablet Place 0.4 mg under the tongue every 5 (five) minutes as needed for chest pain.      . silodosin (RAPAFLO) 4 MG CAPS capsule Take 8 mg by mouth. Every 2 days      . simvastatin (ZOCOR) 40 MG tablet TAKE ONE TABLET BY MOUTH AT BEDTIME  90 tablet  3  . spironolactone (ALDACTONE) 25 MG tablet Take 0.5 tablets (12.5 mg total) by mouth daily.  90 tablet  3  . vitamin B-12 (CYANOCOBALAMIN) 50 MCG tablet Take 50 mcg by mouth daily.      [name] warfarin (COUMADIN) 5 MG tablet Take 2.5-5 mg by mouth daily. Takes 5mg  on Sunday, Monday, Wednesday, and Friday. Takes 2.5mg  on Tuesday, Thursday, and Saturday       No current facility-administered medications for this visit.    Socially he is married and has 3 children and 5 grandchildren. He does exercise most recently with walking. He used to play tennis. There is no tobacco or alcohol use.  ROS he denies fever chills or night sweats. He denies change in weight. He denies change in vision  or hearing. He is unaware of lymphadenopathy. At times he does get sinus congestion. He is unaware of any recurrent atrial fibrillation. He did not have any episodes of VT noted on his recent ICD interrogation He denies angina. He denies PND orthopnea. He denies bleeding. [name] He states his chest and breast region is no longer sore. He denies abdominal pain, nausea vomiting or diarrhea. He  is unaware of blood in stool or urine. He denies claudication. There are no myalgias. There is no history of diabetes or thyroid abnormality. Other comprehensive 14 point system review is negative.  PE BP 110/70  Pulse 52  Ht 5\' 7"  (1.702 m)  Wt 165 lb 1.6 oz (74.889 kg)  BMI 25.85 kg/m2 repeat supine blood pressure 120/70 and standing blood pressure 110/70, without orthostatic change. General: Alert, oriented, no distress.  Skin: normal turgor, no rashes HEENT: Normocephalic, atraumatic. Pupils round and reactive; sclera anicteric;no lid lag,  Nose without nasal septal hypertrophy Mouth/Parynx benign; Mallinpatti scale 2 Neck: No JVD, no carotid bruits Chest wall: No tenderness to palpation Lungs: clear to ausculatation and percussion; no wheezing or rales Heart: RRR, s1 s2 normal 1-2/6 systolic murmur, unchanged. No S3 gallop. No heave or parasternal lift. No rub. Abdomen: soft, nontender; no hepatosplenomehaly, BS+; abdominal aorta nontender and not dilated by palpation. Back: No CVA tenderness Pulses 2+ Extremities: no clubbing cyanosis or edema, Homan's sign negative  Neurologic: grossly nonfocal; cranial nerves normal Psychological: Normal affect and mood.  ECG (independently read by me): Sinus bradycardia 52 beats per minute. Old anterior lateral myocardial infarction. Nonspecific interventricular conduction delay. Previously noted T-wave changes.  ECG from 08/28/2012: Sinus bradycardia 52 beats per minute. No ectopy. Diffuse T wave abnormality the 4 through V6 1 and L. Poor R-wave progression  compatible with prior anterior wall MI.  LABS:  BMET    Component Value Date/Time   NA 137 02/17/2013 0846   K 4.1 02/17/2013 0846   CL 104 02/17/2013 0846   CO2 28 02/17/2013 0846   GLUCOSE 89 02/17/2013 0846   BUN 14 02/17/2013 0846   CREATININE 0.84 02/17/2013 0846   CREATININE 0.9 11/07/2012 1509   CALCIUM 9.2 02/17/2013 0846   GFRNONAA 81* 08/10/2012 1532   GFRAA >90 08/10/2012 1532     Hepatic Function Panel     Component Value Date/Time   PROT 6.9 02/17/2013 0846   ALBUMIN 4.1 02/17/2013 0846   AST 18 02/17/2013 0846   ALT 16 02/17/2013 0846   ALKPHOS 45 02/17/2013 0846   BILITOT 0.7 02/17/2013 0846   BILIDIR 0.2 11/07/2012 1509     CBC    Component Value Date/Time   WBC 7.9 02/17/2013 0846   RBC 5.30 02/17/2013 0846   HGB 14.8 02/17/2013 0846   HCT 43.9 02/17/2013 0846   PLT 253 02/17/2013 0846   MCV 82.8 02/17/2013 0846   MCH 27.9 02/17/2013 0846   MCHC 33.7 02/17/2013 0846   RDW 14.2 02/17/2013 0846   LYMPHSABS 1.6 11/07/2012 1509   MONOABS 0.9 11/07/2012 1509   EOSABS 0.3 11/07/2012 1509   BASOSABS 0.0 11/07/2012 1509     BNP No results found for this basename: probnp    Lipid Panel     Component Value Date/Time   CHOL 127 02/17/2013 0846     RADIOLOGY: No results found.    ASSESSMENT AND PLAN:  Jason. Isac Caddy suffered a large anterior wall myocardial infarction and has ischemic cardiomyopathy with an ejection fraction in the 25-35% range. His last nuclear perfusion study in November 2013 was nonischemic. He is doing well without CHF symptoms on his current medical therapy and is tolerating the carvedilol 25 mg twice a day as well as Lanoxin 0.0625 mg daily, lisinopril 10 mg twice a day low-dose nitrates come and is on Spironolactone 12.5 mg daily. He is on simvastatin for hyperlipidemia. He also is on allopurinol but denies any recent  gout episodes. He takes Nexium for GERD.  He no longer is complaining of tenderness in the breast region. Reviewed his most recent  echo Doppler study with him in detail but essentially this is unchanged from previously. He is maintaining sinus rhythm and is not having any recurrence of paroxysmal atrial fibrillation. He did not have any PVCs noted on ECG he is followed by Dr. [name] who interrogates his implantable cardiac defibrillator. I am recommending laboratory be rechecked in the fasting state is another conference of metabolic panel, CBC, vitamin D level, lipid panel, as well as Lanoxin level. Will contact him regarding the results and adjustments will be made if necessary to his medical regimen.   Troy Sine, MD, [facility]  03/07/2013 10:10 AM

## 2013-03-09 DIAGNOSIS — J309 Allergic rhinitis, unspecified: Secondary | ICD-10-CM | POA: Diagnosis not present

## 2013-03-10 DIAGNOSIS — M25519 Pain in unspecified shoulder: Secondary | ICD-10-CM | POA: Diagnosis not present

## 2013-03-10 DIAGNOSIS — M109 Gout, unspecified: Secondary | ICD-10-CM | POA: Diagnosis not present

## 2013-03-10 DIAGNOSIS — Z79899 Other long term (current) drug therapy: Secondary | ICD-10-CM | POA: Diagnosis not present

## 2013-03-13 DIAGNOSIS — J3081 Allergic rhinitis due to animal (cat) (dog) hair and dander: Secondary | ICD-10-CM | POA: Diagnosis not present

## 2013-03-13 DIAGNOSIS — J301 Allergic rhinitis due to pollen: Secondary | ICD-10-CM | POA: Diagnosis not present

## 2013-03-13 DIAGNOSIS — J3089 Other allergic rhinitis: Secondary | ICD-10-CM | POA: Diagnosis not present

## 2013-03-13 DIAGNOSIS — H65199 Other acute nonsuppurative otitis media, unspecified ear: Secondary | ICD-10-CM | POA: Diagnosis not present

## 2013-03-18 ENCOUNTER — Telehealth: Payer: Self-pay | Admitting: Pharmacist Clinician (PhC)/ Clinical Pharmacy Specialist

## 2013-03-18 NOTE — Telephone Encounter (Signed)
Pt called, has been on levaquin 500mg  qd for 6 days, has 1 tablet remaining for tomorrow.  Pt does not feel well enough yet to come in for INR check.  Still having cough, chest tightness, aches.  Advised pt to skip warfarin dose toady (2.5mg ) and resume regular schedule tomorrow.  He will call Monday to get an appointment for next week.

## 2013-03-19 DIAGNOSIS — J40 Bronchitis, not specified as acute or chronic: Secondary | ICD-10-CM | POA: Diagnosis not present

## 2013-03-23 ENCOUNTER — Ambulatory Visit (INDEPENDENT_AMBULATORY_CARE_PROVIDER_SITE_OTHER): Payer: Medicare Other | Admitting: Pharmacist Clinician (PhC)/ Clinical Pharmacy Specialist

## 2013-03-23 VITALS — BP 100/62 | HR 56

## 2013-03-23 DIAGNOSIS — Z7901 Long term (current) use of anticoagulants: Secondary | ICD-10-CM

## 2013-03-23 DIAGNOSIS — I480 Paroxysmal atrial fibrillation: Secondary | ICD-10-CM

## 2013-03-23 DIAGNOSIS — I4891 Unspecified atrial fibrillation: Secondary | ICD-10-CM

## 2013-03-23 LAB — POCT INR: INR: 2.7

## 2013-03-30 DIAGNOSIS — J309 Allergic rhinitis, unspecified: Secondary | ICD-10-CM | POA: Diagnosis not present

## 2013-03-31 ENCOUNTER — Telehealth: Payer: Self-pay | Admitting: *Deleted

## 2013-04-01 ENCOUNTER — Encounter: Payer: Self-pay | Admitting: Gastroenterology

## 2013-04-01 ENCOUNTER — Ambulatory Visit (INDEPENDENT_AMBULATORY_CARE_PROVIDER_SITE_OTHER): Payer: Medicare Other | Admitting: Physician Assistant

## 2013-04-01 ENCOUNTER — Encounter: Payer: Self-pay | Admitting: Physician Assistant

## 2013-04-01 VITALS — BP 118/78 | HR 54 | Ht 67.0 in | Wt 158.2 lb

## 2013-04-01 DIAGNOSIS — R001 Bradycardia, unspecified: Secondary | ICD-10-CM

## 2013-04-01 DIAGNOSIS — I2589 Other forms of chronic ischemic heart disease: Secondary | ICD-10-CM | POA: Diagnosis not present

## 2013-04-01 DIAGNOSIS — I4891 Unspecified atrial fibrillation: Secondary | ICD-10-CM | POA: Diagnosis not present

## 2013-04-01 DIAGNOSIS — I498 Other specified cardiac arrhythmias: Secondary | ICD-10-CM

## 2013-04-01 DIAGNOSIS — I480 Paroxysmal atrial fibrillation: Secondary | ICD-10-CM

## 2013-04-01 NOTE — Patient Instructions (Addendum)
1.  If you notice your heart rate is elevated and irregular, take an extra 1/2 of coreg. 2.  Follow up with [name] in 3 months.

## 2013-04-01 NOTE — Progress Notes (Signed)
Date:  04/01/2013   ID:  [name], DOB 03-17-1940, MRN [id number]  PCP:  [name], MD  Primary Cardiologist:  Tresa Endo     History of Present Illness: [name] is a 73 y.o. male who is very active and has a history of an ischemic cardiomyopathy and suffered a large anterior wall myocardial infarction in 1992. In September of 1998 he underwent CABG revascularization surgery after an unsuccessful attempt at stenting his proximal LAD by Dr. Juanda Chance. Ejection fraction was 25-30%. In 2002 he underwent initial ICD implantation for nonsustained VT documented on event monitor for primary prevention. In January 2010 he underwent a generator change and has now a Medtronic Virtuoso single-chamber cardioverter defibrillator. Additional problems include paroxysmal atrial fibrillation and occasional PVCs. There was an attempt to reduce his carvedilol dose to overdrive suppress his PVCs, but apparently the patient felt he had more PVCs on the reduced dose and therefore he has been back on the higher dose. The patient's last echo was 2011- EF 25-35%. He recently underwent a 2-year followup nuclear perfusion scan on December 05, 2011. This showed a large area of scar in the LAD territory. The scan was not gated. There was no ischemia.  .  Patient is here today to follow up for an irregular heart rate.  His last ICD interrogation in January showed PVCs. He walks for an hour five days per week. Patient reports lately he's noticed some PVCs and irregular heart rate more frequently. Describes it as a fluttering. He denies any shortness of breath but describes it as "not feeling right". It sounds like he just wants some reassurance. He's been previously started to take an extra half of coreg if he notices the irregularity and elevation of his heart rate.  The patient currently denies nausea, vomiting, fever, chest pain, shortness of breath, orthopnea, dizziness, PND, cough, congestion, abdominal pain,  hematochezia, melena, lower extremity edema, claudication.  Wt Readings from Last 3 Encounters:  04/01/13 158 lb 3.2 oz (71.759 kg)  02/24/13 160 lb (72.576 kg)  02/12/13 163 lb (73.936 kg)     Past Medical History  Diagnosis Date  . Coronary artery disease     Hx MI 1992, CABG 1998 , Nuc study 11.2013 large scar but no ischemia  . 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 Atrial fib/flutter, transient   . GERD (gastroesophageal reflux disease)   . H. pylori infection     Hx of   . Cardiomyopathy, ischemic 2011    with EF 25-35% by echo  . H/O myocardial infarction, greater than 8 weeks 1992    large ant wall injury  . NSVT (nonsustained ventricular tachycardia)     Current Outpatient Prescriptions  Medication Sig Dispense Refill  . acetaminophen (TYLENOL) 650 MG CR tablet Take 650 mg by mouth 2 (two) times daily.       Marland Kitchen allopurinol (ZYLOPRIM) 300 MG tablet Take 150 mg by mouth daily.       Marland Kitchen aspirin 81 MG tablet Take 81 mg by mouth every other day.       . carvedilol (COREG) 25 MG tablet Take 25 mg by mouth 2 (two) times daily with a meal.      . cetirizine (ZYRTEC) 10 MG tablet Take 10 mg by mouth daily.        Marland Kitchen dicyclomine (BENTYL) 10 MG capsule Take 1 capsule (10 mg total) by mouth 2 (two) times daily  as needed.  90 capsule  2  . digoxin (LANOXIN) 0.125 MG tablet Take 0.5 tablets (62.5 mcg total) by mouth daily.  90 tablet  3  . esomeprazole (NEXIUM) 40 MG capsule Take 1 capsule (40 mg total) by mouth 2 (two) times daily before a meal.  60 capsule  11  . fish oil-omega-3 fatty acids 1000 MG capsule Take 1 g by mouth daily.      . isosorbide mononitrate (IMDUR) 30 MG 24 hr tablet 1/2 tablet daily.      Marland Kitchen lisinopril (PRINIVIL) 20 MG tablet Take 0.5 tablets (10 mg total) by mouth 2 (two) times daily.  90 tablet  3  . LORazepam (ATIVAN) 1 MG tablet Take 1 mg by mouth as needed.       . mometasone (ASMANEX) 220 MCG/INH inhaler  Inhale 1 puff into the lungs daily.       . mometasone (NASONEX) 50 MCG/ACT nasal spray Place 2 sprays into the nose daily.      . montelukast (SINGULAIR) 10 MG tablet Take 10 mg by mouth at bedtime.       . nitroGLYCERIN (NITROSTAT) 0.4 MG SL tablet Place 0.4 mg under the tongue every 5 (five) minutes as needed for chest pain.      . silodosin (RAPAFLO) 4 MG CAPS capsule Take 8 mg by mouth. Every 2 days      . simvastatin (ZOCOR) 40 MG tablet TAKE ONE TABLET BY MOUTH AT BEDTIME  90 tablet  3  . spironolactone (ALDACTONE) 25 MG tablet Take 0.5 tablets (12.5 mg total) by mouth daily.  90 tablet  3  . warfarin (COUMADIN) 5 MG tablet Take 2.5-5 mg by mouth daily. Takes 5mg  on Sunday, Monday, Wednesday, and Friday. Takes 2.5mg  on Tuesday, Thursday, and Saturday      . vitamin B-12 (CYANOCOBALAMIN) 50 MCG tablet Take 50 mcg by mouth daily.       No current facility-administered medications for this visit.    Allergies:    Allergies  Allergen Reactions  . Amoxicillin     REACTION: unspecified  . Penicillins     REACTION: unspecified    Social History:  The patient  reports that he has never smoked. He has never used smokeless tobacco. He reports that he drinks alcohol. He reports that he does not use illicit drugs.   Family history:   Family History  Problem Relation Age of Onset  . Colon cancer Neg Hx   . Heart disease Father     questionable  . Stroke Mother   . Heart failure Sister   . Healthy Brother   . Healthy Sister     ROS:  Please see the history of present illness.  All other systems reviewed and negative.   PHYSICAL EXAM: VS:  BP 118/78  Pulse 54  Ht 5\' 7"  (1.702 m)  Wt 158 lb 3.2 oz (71.759 kg)  BMI 24.77 kg/m2 Well nourished, well developed, in no acute distress HEENT: Pupils are equal round react to light accommodation extraocular movements are intact.  Neck: no JVDNo cervical lymphadenopathy. Cardiac: Regular rate and rhythm without murmurs rubs or  gallops. Lungs:  clear to auscultation bilaterally, no wheezing, rhonchi or rales Ext: no lower extremity edema.  2+ radial and dorsalis pedis pulses. Skin: warm and dry Neuro:  Grossly normal      ASSESSMENT AND PLAN:  Problem List Items Addressed This Visit   PAF (paroxysmal atrial fibrillation) - Primary (Chronic)  Patient was instructed to take an extra half of Coreg if he notices an irregular and elevated heart rate.  Do not think he is necessarily symptomatic, as his heart rate doesn't elevate very high. However, he just pays attention and knows when it is out of rhythm.  I think he is just concerned.  I would not increase his Coreg on a permanent basis as he is normally bradycardic and his blood pressure today is well controlled.    Bradycardia

## 2013-04-01 NOTE — Assessment & Plan Note (Addendum)
Patient was instructed to take an extra half of Coreg if he notices an irregular and elevated heart rate.  Do not think he is necessarily symptomatic, as his heart rate doesn't elevate very high. However, he just pays attention and knows when it is out of rhythm.  I think he is just concerned.  I would not increase his Coreg on a permanent basis as he is normally bradycardic and his blood pressure today is well controlled.

## 2013-04-06 ENCOUNTER — Encounter: Payer: Self-pay | Admitting: *Deleted

## 2013-04-06 ENCOUNTER — Ambulatory Visit (INDEPENDENT_AMBULATORY_CARE_PROVIDER_SITE_OTHER): Payer: Medicare Other | Admitting: *Deleted

## 2013-04-06 DIAGNOSIS — I5022 Chronic systolic (congestive) heart failure: Secondary | ICD-10-CM

## 2013-04-06 DIAGNOSIS — J309 Allergic rhinitis, unspecified: Secondary | ICD-10-CM | POA: Diagnosis not present

## 2013-04-06 DIAGNOSIS — Z95810 Presence of automatic (implantable) cardiac defibrillator: Secondary | ICD-10-CM

## 2013-04-06 NOTE — Progress Notes (Signed)
EPIC Encounter for ICM Monitoring  Patient Name: [name] is a 73 y.o. male Date: 04/06/2013 Primary Care Physican: [name], MD Primary Cardiologist: [name] Electrophysiologist: [name] Dry Weight: 157 lbs       In the past month, have you:  1. Gained more than 2 pounds in a day or more than 5 pounds in a week? no  2. Had changes in your medications (with verification of current medications)? No (Dr. [name] did have the patient take spironolactone 25 mg - a whole tablet x 5 days when I spoke with him last month. He is back to taking 1/2 tablet of spironolactone daily).  3. Had more shortness of breath than is usual for you? no  4. Limited your activity because of shortness of breath? no  5. Not been able to sleep because of shortness of breath? no  6. Had increased swelling in your feet or ankles? no  7. Had symptoms of dehydration (dizziness, dry mouth, increased thirst, decreased urine output) no  8. Had changes in sodium restriction? no  9. Been compliant with medication? Yes  ** Patient had been in a-fib last month when we spoke, but converted on his own. He complains of intermittent a-fib every few days that does not last for very long. **   ICM trend:   Follow-up plan: ICM clinic phone appointment: 05/07/13 (full transmission).  Copy of note sent to patient's primary care physician, primary cardiologist, and device following physician.  Alvis Lemmings, RN, BSN 04/06/2013 10:48 AM

## 2013-04-10 ENCOUNTER — Telehealth: Payer: Self-pay | Admitting: Cardiovascular Disease

## 2013-04-10 MED ORDER — CARVEDILOL 25 MG PO TABS: 37.5000 mg | ORAL_TABLET | Freq: Two times a day (BID) | ORAL | Status: DC

## 2013-04-10 NOTE — Telephone Encounter (Signed)
Message printed and left on Dr. Evette Georges computer to review when he comes in the office.

## 2013-04-10 NOTE — Telephone Encounter (Signed)
Spoke w/ [name] and advised pt increase Coreg to 37.5 mg twice daily.  Returned call and informed pt per instructions by MD.  Pt also advised to call the office even if after hours or weekend for advice by provider.  Pt advised to call back on Monday w/ update if tolerating okay and BP/HR stable.  Pt verbalized understanding and agreed w/ plan.  BP > 100/60 HR > 50

## 2013-04-10 NOTE — Telephone Encounter (Signed)
Is stating that his heart is AFIB ,.. He feels like its quivering .Marland Kitchen  Blood Pressure 145/95.Marland KitchenNormally its 110/60.Marland Kitchen Per Mr.Birnie he rather come into the office to see someone and not go to the ER.Marland Kitchen Please Call  Thanks

## 2013-04-10 NOTE — Telephone Encounter (Signed)
Returned call and pt verified x 2.  Pt stated he is in Afib and BP is up w/ Afib.  Denied CP or SOB.  RN asked what his HR is and pt checked and it is 74.  Stated BP normally runs 110/60 and today 145/90.  Pt wants to come in to be seen.  RN asked pt if he took extra 1/2 Coreg as directed last week and pt denied taking it today.  Stated he took it yesterday and it didn't really work.  Pt stated he will take the extra half Coreg now.  Pt informed [name] will be notified for further instructions and RN will call him back.  Pt verbalized understanding and agreed w/ plan.

## 2013-04-13 DIAGNOSIS — J309 Allergic rhinitis, unspecified: Secondary | ICD-10-CM | POA: Diagnosis not present

## 2013-04-20 ENCOUNTER — Ambulatory Visit (INDEPENDENT_AMBULATORY_CARE_PROVIDER_SITE_OTHER): Payer: Medicare Other | Admitting: Pharmacist Clinician (PhC)/ Clinical Pharmacy Specialist

## 2013-04-20 ENCOUNTER — Ambulatory Visit: Payer: Medicare Other | Admitting: Pharmacist Clinician (PhC)/ Clinical Pharmacy Specialist

## 2013-04-20 DIAGNOSIS — Z7901 Long term (current) use of anticoagulants: Secondary | ICD-10-CM | POA: Diagnosis not present

## 2013-04-20 DIAGNOSIS — I4891 Unspecified atrial fibrillation: Secondary | ICD-10-CM

## 2013-04-20 DIAGNOSIS — I480 Paroxysmal atrial fibrillation: Secondary | ICD-10-CM

## 2013-04-20 LAB — POCT INR: INR: 2.8

## 2013-04-21 ENCOUNTER — Telehealth: Payer: Self-pay | Admitting: *Deleted

## 2013-04-21 ENCOUNTER — Emergency Department (HOSPITAL_COMMUNITY): Payer: Medicare Other

## 2013-04-21 ENCOUNTER — Encounter (HOSPITAL_COMMUNITY): Payer: Self-pay | Admitting: Emergency Medicine

## 2013-04-21 ENCOUNTER — Emergency Department (HOSPITAL_COMMUNITY)
Admission: EM | Admit: 2013-04-21 | Discharge: 2013-04-21 | Disposition: A | Discharge status: Home / Self Care | Payer: Medicare Other | Attending: Emergency Medicine | Admitting: Emergency Medicine

## 2013-04-21 DIAGNOSIS — Z951 Presence of aortocoronary bypass graft: Secondary | ICD-10-CM | POA: Insufficient documentation

## 2013-04-21 DIAGNOSIS — I4891 Unspecified atrial fibrillation: Secondary | ICD-10-CM | POA: Insufficient documentation

## 2013-04-21 DIAGNOSIS — I4892 Unspecified atrial flutter: Secondary | ICD-10-CM | POA: Insufficient documentation

## 2013-04-21 DIAGNOSIS — Z7982 Long term (current) use of aspirin: Secondary | ICD-10-CM | POA: Diagnosis not present

## 2013-04-21 DIAGNOSIS — Z7901 Long term (current) use of anticoagulants: Secondary | ICD-10-CM | POA: Insufficient documentation

## 2013-04-21 DIAGNOSIS — Z8619 Personal history of other infectious and parasitic diseases: Secondary | ICD-10-CM | POA: Diagnosis not present

## 2013-04-21 DIAGNOSIS — Z95810 Presence of automatic (implantable) cardiac defibrillator: Secondary | ICD-10-CM | POA: Insufficient documentation

## 2013-04-21 DIAGNOSIS — I2510 Atherosclerotic heart disease of native coronary artery without angina pectoris: Secondary | ICD-10-CM | POA: Diagnosis not present

## 2013-04-21 DIAGNOSIS — I252 Old myocardial infarction: Secondary | ICD-10-CM | POA: Insufficient documentation

## 2013-04-21 DIAGNOSIS — Z880 Allergy status to penicillin: Secondary | ICD-10-CM | POA: Diagnosis not present

## 2013-04-21 DIAGNOSIS — K219 Gastro-esophageal reflux disease without esophagitis: Secondary | ICD-10-CM | POA: Diagnosis not present

## 2013-04-21 DIAGNOSIS — IMO0002 Reserved for concepts with insufficient information to code with codable children: Secondary | ICD-10-CM | POA: Insufficient documentation

## 2013-04-21 DIAGNOSIS — R0602 Shortness of breath: Secondary | ICD-10-CM | POA: Diagnosis not present

## 2013-04-21 LAB — CBC
HCT: 46 % (ref 39.0–52.0)
HEMOGLOBIN: 15.6 g/dL (ref 13.0–17.0)
MCH: 29.4 pg (ref 26.0–34.0)
MCHC: 33.9 g/dL (ref 30.0–36.0)
MCV: 86.6 fL (ref 78.0–100.0)
Platelets: 228 10*3/uL (ref 150–400)
RBC: 5.31 MIL/uL (ref 4.22–5.81)
RDW: 14.5 % (ref 11.5–15.5)
WBC: 10.5 10*3/uL (ref 4.0–10.5)

## 2013-04-21 LAB — I-STAT TROPONIN, ED: TROPONIN I, POC: 0 ng/mL (ref 0.00–0.08)

## 2013-04-21 LAB — BASIC METABOLIC PANEL
BUN: 14 mg/dL (ref 6–23)
CALCIUM: 9.1 mg/dL (ref 8.4–10.5)
CO2: 25 meq/L (ref 19–32)
CREATININE: 0.75 mg/dL (ref 0.50–1.35)
Chloride: 98 mEq/L (ref 96–112)
GFR calc Af Amer: >90 mL/min (ref >90)
GFR calc non Af Amer: 89 mL/min — ABNORMAL LOW (ref >90)
GLUCOSE: 84 mg/dL (ref 70–99)
Potassium: 4.3 mEq/L (ref 3.7–5.3)
SODIUM: 135 meq/L — AB (ref 137–147)

## 2013-04-21 MED ORDER — CARVEDILOL 25 MG PO TABS
37.5000 mg | ORAL_TABLET | Freq: Two times a day (BID) | ORAL | Status: DC
Start: 2013-04-21 — End: 2013-04-29

## 2013-04-21 NOTE — Telephone Encounter (Signed)
Message from pt w/ BP readings:  3/19  117/57  53 119/57  57 3/20  111/55  52 110/59  55 3/21  118/54  55 120/62  50 3/21(pm) 120/57  52 106/52  56 3/22  120/57  52   Call to pt and pt stated he thinks the 1.5 tabs is working.  Pt c/o occasional lightheadedness and dizziness and skipped beats.  Pt advised to increase water intake and call back if skipped beats occur more frequently.  Pt also advised to call back if BP < 100/60, HR < 50 AND symptomatic w/ CP, SOB, dizziness, lightheadedness or other concerns, even if after hours.  Pt verbalized understanding and agreed w/ plan.  Pt requested script be sent to pharmacy.  Refill(s) sent to pharmacy w/ dose increase.

## 2013-04-21 NOTE — ED Notes (Signed)
1803, MD just finished with talking with pt about discharge.  Nurse will now do discharge.

## 2013-04-21 NOTE — ED Notes (Signed)
Patient transported to X-ray 

## 2013-04-21 NOTE — Discharge Instructions (Signed)
Atrial Fibrillation Atrial fibrillation is a condition that causes your heart to beat irregularly. It may also cause your heart to beat faster than normal. Atrial fibrillation can prevent your heart from pumping blood normally. It increases your risk of stroke and heart problems. HOME CARE  Take medications as told by your doctor.  Only take medications that your doctor says are safe. Some medications can make the condition worse or happen again.  If blood thinners were prescribed by your doctor, take them exactly as told. Too much can cause bleeding. Too little and you will not have the needed protection against stroke and other problems.  Perform blood tests at home if told by your doctor.  Perform blood tests exactly as told by your doctor.  Do not drink alcohol.  Do not drink beverages with caffeine such as coffee, soda, and some teas.  Maintain a healthy weight.  Do not use diet pills unless your doctor says they are safe. They may make heart problems worse.  Follow diet instructions as told by your doctor.  Exercise regularly as told by your doctor.  Keep all follow-up appointments. GET HELP RIGHT AWAY IF:   You have chest or belly (abdominal) pain.  You feel sick to your stomach (nauseous)  You suddenly have swollen feet and ankles.  You feel dizzy.  You face, arms, or legs feel numb or weak.  There is a change in your vision or speech.  You notice a change in the speed, rhythm, or strength of your heartbeat.  You suddenly begin peeing (urinating) more often.  You get tired more easily when moving or exercising. MAKE SURE YOU:   Understand these instructions.  Will watch your condition.  Will get help right away if you are not doing well or get worse. Document Released: 10/25/2007 Document Revised: 05/12/2012 Document Reviewed: 02/26/2012 [facility] Patient Information 2014 [place].  Continue current medications. Cardiology will call you for  followup time. She called the office tomorrow see with time that'll be. Return for persistent rapid heart rate or for any newer worse symptoms.

## 2013-04-21 NOTE — ED Notes (Signed)
Back from xray, monitors back on.

## 2013-04-21 NOTE — Telephone Encounter (Signed)
Pt called and verified.  Stated his BP is all over the place.  Stated BP as 151/120 HR 105 and he can feel his heart beating in his chest.  Pt denied CP and c/o "just a little" SOB.  Pt audibly w/ a dry cough.  BP check in RA is reading "Error."  Cecilie Kicks, NP notified and advised pt go to ER for evaluation.  Stated he needs a higher level of care than can be provided in the office w/ that BP, Afib and SOB.  Pt informed and agreed w/ plan.  Pt stated his wife will take him to [facility].  Pt requested [name] be notified and informed he will be.  Pt verbalized understanding and agreed w/ plan.  Message forwarded to [name] [facility]).

## 2013-04-21 NOTE — ED Notes (Signed)
Pt reports feeling palpitations and having SOB.  Pt with ICD in place.  Pt denies any defib firing.

## 2013-04-21 NOTE — ED Notes (Signed)
Pt now gone from room.

## 2013-04-21 NOTE — ED Notes (Signed)
Onset 1 week pt has been noticing heart rate changes in the past week.  HR would be in 50, then in 100's.  Called PCP and was advised to increase Coreg to 37.5 mg from 25mg .  Pt took 37.5 mg coreg this morning.  Pt called PCP this morning and was advised to come to ED.  Pt denies shortness of breath or chest pain. No edema.  + pedal pulses. No other s/s noted.

## 2013-04-21 NOTE — ED Notes (Signed)
Pt getting dressed now.  

## 2013-04-21 NOTE — ED Provider Notes (Signed)
CSN: [id number]     Arrival date & time 04/21/13  1410 History   First MD Initiated Contact with Patient 04/21/13 1438     Chief Complaint  Patient presents with  . Shortness of Breath  . Palpitations     (Consider location/radiation/quality/duration/timing/severity/associated sxs/prior Treatment) Patient is a 73 y.o. male presenting with shortness of breath and palpitations. The history is provided by the patient.  Shortness of Breath Associated symptoms: no abdominal pain, no chest pain, no fever, no headaches, no neck pain and no rash   Palpitations Associated symptoms: shortness of breath   Associated symptoms: no chest pain    patient referred in by cardiology office. Followed by Dr. Georgina Peer. Patient has a known history of each were fibrillation. Patient also has an ICD in place. That has not fired. Patient denies any chest pain. Patient presents for palpitations morning with rapid heart rate and having some shortness of breath. Patient has been placed on Coreg used to be on 25 mg twice a day was increased by Dr. Georgina Peer here recently just a few days ago 37.5 mg twice a day.  Past Medical History  Diagnosis Date  . Coronary artery disease     Hx MI 1992, CABG 1998 , Nuc study 11.2013 large scar but no ischemia  . 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 Atrial fib/flutter, transient   . GERD (gastroesophageal reflux disease)   . H. pylori infection     Hx of   . Cardiomyopathy, ischemic 2011    with EF 25-35% by echo  . H/O myocardial infarction, greater than 8 weeks 1992    large ant wall injury  . NSVT (nonsustained ventricular tachycardia)    Past Surgical History  Procedure Laterality Date  . Nasal sinus surgery  05/31/2010    Dr. Benjamine Mola  . Coronary artery bypass graft  1998    x 5  . Cataract extraction, bilateral    . Knee surgery      right  . Hernia repair    . Icd generator change  01/2008    medtronic, hx+ EP study    Family History  Problem Relation Age of Onset  . Colon cancer Neg Hx   . Heart disease Father     questionable  . Stroke Mother   . Heart failure Sister   . Healthy Brother   . Healthy Sister    History  Substance Use Topics  . Smoking status: Never Smoker   . Smokeless tobacco: Never Used  . Alcohol Use: Yes     Comment: socially    Review of Systems  Constitutional: Negative for fever.  HENT: Negative for congestion.   Eyes: Negative for visual disturbance.  Respiratory: Positive for shortness of breath.   Cardiovascular: Positive for palpitations. Negative for chest pain.  Gastrointestinal: Negative for abdominal pain.  Genitourinary: Negative for dysuria.  Musculoskeletal: Negative for neck pain.  Skin: Negative for rash.  Neurological: Negative for headaches.  Hematological: Does not bruise/bleed easily.  Psychiatric/Behavioral: Negative for confusion.      Allergies  Amoxicillin and Penicillins  Home Medications   Current Outpatient Rx  Name  Route  Sig  Dispense  Refill  . acetaminophen (TYLENOL) 650 MG CR tablet   Oral   Take 650 mg by mouth 2 (two) times daily.          Marland Kitchen allopurinol (ZYLOPRIM) 300 MG tablet   Oral   Take 150  mg by mouth daily.          Marland Kitchen aspirin 81 MG tablet   Oral   Take 81 mg by mouth every other day.          . carvedilol (COREG) 25 MG tablet   Oral   Take 1.5 tablets (37.5 mg total) by mouth 2 (two) times daily with a meal.   270 tablet   1     Please note dose increase and hold until refill du ...   . cetirizine (ZYRTEC) 10 MG tablet   Oral   Take 10 mg by mouth daily.           Marland Kitchen dicyclomine (BENTYL) 10 MG capsule   Oral   Take 1 capsule (10 mg total) by mouth 2 (two) times daily as needed.   90 capsule   2   . digoxin (LANOXIN) 0.125 MG tablet   Oral   Take 0.5 tablets (62.5 mcg total) by mouth daily.   90 tablet   3   . esomeprazole (NEXIUM) 40 MG capsule   Oral   Take 1 capsule (40 mg  total) by mouth 2 (two) times daily before a meal.   60 capsule   11   . fish oil-omega-3 fatty acids 1000 MG capsule   Oral   Take 1 g by mouth daily.         . isosorbide mononitrate (IMDUR) 30 MG 24 hr tablet      1/2 tablet daily.         Marland Kitchen lisinopril (PRINIVIL) 20 MG tablet   Oral   Take 0.5 tablets (10 mg total) by mouth 2 (two) times daily.   90 tablet   3   . LORazepam (ATIVAN) 1 MG tablet   Oral   Take 1 mg by mouth as needed.          . mometasone (ASMANEX) 220 MCG/INH inhaler   Inhalation   Inhale 1 puff into the lungs daily.          . mometasone (NASONEX) 50 MCG/ACT nasal spray   Nasal   Place 2 sprays into the nose daily.         . montelukast (SINGULAIR) 10 MG tablet   Oral   Take 10 mg by mouth at bedtime.          . nitroGLYCERIN (NITROSTAT) 0.4 MG SL tablet   Sublingual   Place 0.4 mg under the tongue every 5 (five) minutes as needed for chest pain.         . silodosin (RAPAFLO) 4 MG CAPS capsule   Oral   Take 8 mg by mouth. Every 2 days         . simvastatin (ZOCOR) 40 MG tablet      TAKE ONE TABLET BY MOUTH AT BEDTIME   90 tablet   3   . spironolactone (ALDACTONE) 25 MG tablet   Oral   Take 0.5 tablets (12.5 mg total) by mouth daily.   90 tablet   3   . vitamin B-12 (CYANOCOBALAMIN) 50 MCG tablet   Oral   Take 50 mcg by mouth daily.         Marland Kitchen warfarin (COUMADIN) 5 MG tablet   Oral   Take 2.5-5 mg by mouth daily. Takes 5mg  on Sunday, Monday, Wednesday, and Friday. Takes 2.5mg  on Tuesday, Thursday, and Saturday          BP 138/71  Pulse 123  Temp(Src)  98.3 F (36.8 C) (Oral)  Resp 16  Ht 5\' 7"  (1.702 m)  Wt 158 lb (71.668 kg)  BMI 24.74 kg/m2  SpO2 97% Physical Exam  Nursing note and vitals reviewed. Constitutional: He is oriented to person, place, and time. He appears well-developed and well-nourished. No distress.  HENT:  Head: Normocephalic and atraumatic.  Mouth/Throat: Oropharynx is clear and  moist.  Eyes: Conjunctivae and EOM are normal. Pupils are equal, round, and reactive to light.  Neck: Normal range of motion. Neck supple.  Cardiovascular: Normal rate and normal heart sounds.   No murmur heard. Irregular.  Pulmonary/Chest: Effort normal and breath sounds normal. No respiratory distress. He has no rales.  Abdominal: Soft. Bowel sounds are normal. There is no tenderness.  Musculoskeletal: Normal range of motion. He exhibits no edema.  Neurological: He is alert and oriented to person, place, and time. No cranial nerve deficit. He exhibits normal muscle tone. Coordination normal.  Skin: Skin is warm. No erythema.    ED Course  Procedures (including critical care time) Labs Review Labs Reviewed  BASIC METABOLIC PANEL - Abnormal; Notable for the following:    Sodium 135 (*)    GFR calc non Af Amer 89 (*)    All other components within normal limits  CBC  I-STAT TROPOININ, ED   Results for orders placed during the hospital encounter of 04/21/13  CBC      Result Value Ref Range   WBC 10.5  4.0 - 10.5 K/uL   RBC 5.31  4.22 - 5.81 MIL/uL   Hemoglobin 15.6  13.0 - 17.0 g/dL   HCT 46.0  39.0 - 52.0 %   MCV 86.6  78.0 - 100.0 fL   MCH 29.4  26.0 - 34.0 pg   MCHC 33.9  30.0 - 36.0 g/dL   RDW 14.5  11.5 - 15.5 %   Platelets 228  150 - 400 K/uL  BASIC METABOLIC PANEL      Result Value Ref Range   Sodium 135 (*) 137 - 147 mEq/L   Potassium 4.3  3.7 - 5.3 mEq/L   Chloride 98  96 - 112 mEq/L   CO2 25  19 - 32 mEq/L   Glucose, Bld 84  70 - 99 mg/dL   BUN 14  6 - 23 mg/dL   Creatinine, Ser 0.75  0.50 - 1.35 mg/dL   Calcium 9.1  8.4 - 10.5 mg/dL   GFR calc non Af Amer 89 (*) >90 mL/min   GFR calc Af Amer >90  >90 mL/min  I-STAT TROPOININ, ED      Result Value Ref Range   Troponin i, poc 0.00  0.00 - 0.08 ng/mL   Comment 3             Imaging Review Dg Chest 2 View  04/21/2013   CLINICAL DATA:  Shortness of breath and palpitations, history previous MI and CABG   EXAM: CHEST  2 VIEW  COMPARISON:  Chest x-ray of October 15, 2010  FINDINGS: The lungs are adequately inflated. There is no focal infiltrate. There is no pleural effusion or pneumothorax. The cardiac silhouette is top-normal in size. The pulmonary vascularity is not engorged. A permanent pacemaker defibrillator is in place. There are 6 intact sternal wires demonstrated. Coronary artery markers are present. The observed portions of the bony thorax exhibit no acute abnormalities.  IMPRESSION: There is no evidence of pneumonia nor CHF nor other acute cardiopulmonary disease.   Electronically Signed   By:  David  Martinique   On: 04/21/2013 15:28     EKG Interpretation   Date/Time:  Tuesday April 21 2013 14:15:53 EDT Ventricular Rate:  120 PR Interval:    QRS Duration: 124 QT Interval:  336 QTC Calculation: 474 R Axis:   -62 Text Interpretation:  Atrial fibrillation with rapid ventricular response  Left anterior fascicular block Anteroseptal infarct , age undetermined T  wave abnormality, consider lateral ischemia or digitalis effect Abnormal  ECG Otherwise no significant change Has had Atrial fib in past Confirmed  by Kimyah Frein  MD, [address] [phone]) on 04/21/2013 4:53:52 PM      MDM   Final diagnoses:  Atrial fibrillation with rapid ventricular response   Discussed with on-call cardiology Dr. Archie Patten. Patient cleared by them to go home they will contact him for followup. His Coreg is maxed out currently. He may consider starting him on a long-acting beta blocker. Patient's atrial fib for the most part the rate is below 100. Occasionally gets a little above 100. Nothing in the 140s or 150s. Chest x-ray negative EKG otherwise without any significant changes. Troponin was negative. Patient really had any chest pain. Chest x-rays negative for pneumonia or pulmonary edema or pneumothorax. Basic labs without significant abnormalities of the mild hyponatremia.     Mervin Kung, MD 04/21/13  [phone]

## 2013-04-22 NOTE — Telephone Encounter (Signed)
Pt was seen in ER and discharged.   Call to pt and informed f/u appt needed.  Pt verbalized understanding and scheduled appt on 4.1.15 at 9:40am w/ Kerin Ransom, PA-C. Sooner appt offered, but pt prefers to come in on a day [name] is in the office.  Pt will call back if sooner appt needed.

## 2013-04-22 NOTE — Telephone Encounter (Signed)
Did pt go to the ER? Will need f/u OV

## 2013-04-23 DIAGNOSIS — J309 Allergic rhinitis, unspecified: Secondary | ICD-10-CM | POA: Diagnosis not present

## 2013-04-24 NOTE — Telephone Encounter (Signed)
Set up in office with extender a day that I am there

## 2013-04-27 DIAGNOSIS — J309 Allergic rhinitis, unspecified: Secondary | ICD-10-CM | POA: Diagnosis not present

## 2013-04-27 NOTE — Telephone Encounter (Signed)
Pt already scheduled for 4.1.15 when [name] is scheduled in the office.

## 2013-04-29 ENCOUNTER — Encounter: Payer: Self-pay | Admitting: Cardiology

## 2013-04-29 ENCOUNTER — Ambulatory Visit (INDEPENDENT_AMBULATORY_CARE_PROVIDER_SITE_OTHER): Payer: Medicare Other | Admitting: Cardiology

## 2013-04-29 VITALS — BP 110/78 | HR 52 | Ht 67.0 in | Wt 159.0 lb

## 2013-04-29 DIAGNOSIS — I2589 Other forms of chronic ischemic heart disease: Secondary | ICD-10-CM | POA: Diagnosis not present

## 2013-04-29 DIAGNOSIS — I498 Other specified cardiac arrhythmias: Secondary | ICD-10-CM | POA: Diagnosis not present

## 2013-04-29 DIAGNOSIS — I4891 Unspecified atrial fibrillation: Secondary | ICD-10-CM

## 2013-04-29 DIAGNOSIS — I2510 Atherosclerotic heart disease of native coronary artery without angina pectoris: Secondary | ICD-10-CM | POA: Diagnosis not present

## 2013-04-29 DIAGNOSIS — R001 Bradycardia, unspecified: Secondary | ICD-10-CM

## 2013-04-29 DIAGNOSIS — I480 Paroxysmal atrial fibrillation: Secondary | ICD-10-CM

## 2013-04-29 DIAGNOSIS — Z95810 Presence of automatic (implantable) cardiac defibrillator: Secondary | ICD-10-CM | POA: Diagnosis not present

## 2013-04-29 MED ORDER — SIMVASTATIN 40 MG PO TABS: 40.0000 mg | ORAL_TABLET | Freq: Every day | ORAL | Status: DC

## 2013-04-29 MED ORDER — MONTELUKAST SODIUM 10 MG PO TABS: 10.0000 mg | ORAL_TABLET | Freq: Every day | ORAL | Status: DC

## 2013-04-29 MED ORDER — CARVEDILOL 25 MG PO TABS: 37.5000 mg | ORAL_TABLET | Freq: Two times a day (BID) | ORAL | Status: DC

## 2013-04-29 NOTE — Patient Instructions (Signed)
Call if you have recurrent AF Keep your appointment with Dr [name]

## 2013-04-29 NOTE — Assessment & Plan Note (Signed)
Hx MI 1992, CABG 1998 , Nuc study 11.2013 large scar but no ischemia

## 2013-04-29 NOTE — Assessment & Plan Note (Signed)
No CHF on exam

## 2013-04-29 NOTE — Assessment & Plan Note (Signed)
Tolerating increased beta blocker

## 2013-04-29 NOTE — Progress Notes (Signed)
04/29/2013 [name] [facility]   08/18/1940  [id number]  Primary Physicia [name], MD Primary Cardiologist: Dr Tresa Endo  HPI:  The patient is a 73 year old gentleman who has a history of an ischemic cardiomyopathy and suffered a large anterior wall myocardial infarction in 1992. In September of 1998 he underwent CABG revascularization surgery after an unsuccessful attempt at stenting his proximal LAD by Dr. Juanda Chance. He  underwent a 2-year followup nuclear perfusion scan on December 05, 2011. This showed a large area of scar in the LAD territory. There was no ischemia. The patient's last echo was Dec 2014- EF 25-30%.             In 2002 he underwent initial ICD implantation for nonsustained VT documented on event monitor. In January 2010 he underwent a generator change and has now a Medtronic Virtuoso single-chamber cardioverter defibrillator.           Additional problems include paroxysmal atrial fibrillation and occasional PVCs.   He has not had problems with CHF and he has been active. He walks for an hour every day. He has recently had recurrent episodes of PAF. His Coreg was increased to 37.5 mg BID a couple of months ago, but then later decreased back to 25 mg BID. He has baseline bradycardia. He was recnetly seen in the ER 04/21/13 and noted to be in rapid AF. His in the office today for follow up. He is back in NSR at 60 on Coreg 37.5 mg BID. I discussed his case with Dr Tresa Endo. If he has recurrent PAF we will start Amiodarone 200 mg daily and decrease his Coreg to 25 mg BID. He has an apt with Dr Tresa Endo in May.     Current Outpatient Prescriptions  Medication Sig Dispense Refill  . acetaminophen (TYLENOL) 650 MG CR tablet Take 650 mg by mouth 2 (two) times daily.       Marland Kitchen allopurinol (ZYLOPRIM) 300 MG tablet Take 150 mg by mouth daily.       Marland Kitchen aspirin 81 MG tablet Take 81 mg by mouth every other day.       . carvedilol (COREG) 25 MG tablet Take 1.5 tablets (37.5 mg total) by mouth 2 (two)  times daily with a meal.  270 tablet  11  . cetirizine (ZYRTEC) 10 MG tablet Take 10 mg by mouth daily.        Marland Kitchen dicyclomine (BENTYL) 10 MG capsule Take 1 capsule (10 mg total) by mouth 2 (two) times daily as needed.  90 capsule  2  . digoxin (LANOXIN) 0.125 MG tablet Take 0.5 tablets (62.5 mcg total) by mouth daily.  90 tablet  3  . esomeprazole (NEXIUM) 40 MG capsule Take 1 capsule (40 mg total) by mouth 2 (two) times daily before a meal.  60 capsule  11  . fish oil-omega-3 fatty acids 1000 MG capsule Take 1 g by mouth daily.      . isosorbide mononitrate (IMDUR) 30 MG 24 hr tablet 1/2 tablet daily.      Marland Kitchen lisinopril (PRINIVIL) 20 MG tablet Take 0.5 tablets (10 mg total) by mouth 2 (two) times daily.  90 tablet  3  . LORazepam (ATIVAN) 1 MG tablet Take 1 mg by mouth as needed.       . mometasone (ASMANEX) 220 MCG/INH inhaler Inhale 1 puff into the lungs daily.       . mometasone (NASONEX) 50 MCG/ACT nasal spray Place 2 sprays into the nose daily.      Marland Kitchen  montelukast (SINGULAIR) 10 MG tablet Take 1 tablet (10 mg total) by mouth at bedtime.  90 tablet  3  . nitroGLYCERIN (NITROSTAT) 0.4 MG SL tablet Place 0.4 mg under the tongue every 5 (five) minutes as needed for chest pain.      . silodosin (RAPAFLO) 4 MG CAPS capsule Take 8 mg by mouth. Every 2 days      . simvastatin (ZOCOR) 40 MG tablet Take 1 tablet (40 mg total) by mouth at bedtime.  90 tablet  3  . spironolactone (ALDACTONE) 25 MG tablet Take 0.5 tablets (12.5 mg total) by mouth daily.  90 tablet  3  . vitamin B-12 (CYANOCOBALAMIN) 50 MCG tablet Take 50 mcg by mouth daily.      Marland Kitchen warfarin (COUMADIN) 5 MG tablet Take 2.5-5 mg by mouth daily. Takes 5mg  on Sunday, Monday, Wednesday, and Friday. Takes 2.5mg  on Tuesday, Thursday, and Saturday       No current facility-administered medications for this visit.    Allergies  Allergen Reactions  . Amoxicillin     REACTION: unspecified  . Penicillins     REACTION: unspecified    History     Social History  . Marital Status: Married    Spouse Name: N/A    Number of Children: 3  . Years of Education: N/A   Occupational History  . slef employed semi retired    Social History Main Topics  . Smoking status: Never Smoker   . Smokeless tobacco: Never Used  . Alcohol Use: Yes     Comment: socially  . Drug Use: No  . Sexual Activity: Not on file   Other Topics Concern  . Not on file   Social History Narrative  . No narrative on file     Review of Systems: General: negative for chills, fever, night sweats or weight changes.  Cardiovascular: negative for chest pain, dyspnea on exertion, edema, orthopnea, palpitations, paroxysmal nocturnal dyspnea or shortness of breath Dermatological: negative for rash Respiratory: negative for cough or wheezing Urologic: negative for hematuria Abdominal: negative for nausea, vomiting, diarrhea, bright red blood per rectum, melena, or hematemesis Neurologic: negative for visual changes, syncope, or dizziness All other systems reviewed and are otherwise negative except as noted above.    Blood pressure 110/78, pulse 52, height 5\' 7"  (1.702 m), weight 159 lb (72.122 kg).  General appearance: alert, cooperative and no distress Lungs: clear to auscultation bilaterally Heart: regular rate and rhythm  EKG NSR 60  ASSESSMENT AND PLAN:   PAF (paroxysmal atrial fibrillation) NSR today.  Bradycardia Tolerating increased beta blocker  CARDIOMYOPATHY, ISCHEMIC- last EF 25-30% Dec 2014 No CHF on exam  ICD- MDT '02 with gen change Jan 2010 .  CABG '98. Low risk Myoview 11/13 Hx MI 1992, CABG 1998 , Nuc study 11.2013 large scar but no ischemia   PLAN  If he has recurrent PAF start Amio 200mg  daily and decrease Coreg to 25 mg BID.   Talissa Apple KPA-C 04/29/2013 10:41 AM

## 2013-04-29 NOTE — Assessment & Plan Note (Signed)
NSR today 

## 2013-05-04 DIAGNOSIS — J309 Allergic rhinitis, unspecified: Secondary | ICD-10-CM | POA: Diagnosis not present

## 2013-05-09 ENCOUNTER — Telehealth: Payer: Self-pay | Admitting: Physician Assistant

## 2013-05-09 NOTE — Telephone Encounter (Signed)
[name] is a 73 y.o. male with a hx of ICM, CAD, s/p ICD, PAF.  He has been seen recently for recurrent AFib.  He is somewhat symptomatic.  He feels he is back in AFib today.  His HR is in the 80s.  He feels somewhat more short of breath.  He denies chest pain or ICD rx.  He was told at his last visit he would be started on Amiodarone if he has recurrent AFib.    The patient will need to be brought in Monday for an appointment to be seen prior to starting Amiodarone.  He can come to the ED this weekend if he feels worse.  He is on a high dose of Coreg.  I will not adjust this.    I have left a message at [place].  He can see either [name] or Dr. [name] Monday 4/13.  Signed, [name], PA-C   05/09/2013 8:40 AM

## 2013-05-11 ENCOUNTER — Ambulatory Visit (INDEPENDENT_AMBULATORY_CARE_PROVIDER_SITE_OTHER): Payer: Medicare Other | Admitting: *Deleted

## 2013-05-11 ENCOUNTER — Encounter: Payer: Self-pay | Admitting: Physician Assistant

## 2013-05-11 ENCOUNTER — Ambulatory Visit (INDEPENDENT_AMBULATORY_CARE_PROVIDER_SITE_OTHER): Payer: Medicare Other | Admitting: Physician Assistant

## 2013-05-11 VITALS — BP 116/72 | HR 50 | Ht 67.0 in | Wt 161.0 lb

## 2013-05-11 DIAGNOSIS — I5022 Chronic systolic (congestive) heart failure: Secondary | ICD-10-CM | POA: Diagnosis not present

## 2013-05-11 DIAGNOSIS — I2589 Other forms of chronic ischemic heart disease: Secondary | ICD-10-CM | POA: Diagnosis not present

## 2013-05-11 DIAGNOSIS — I472 Ventricular tachycardia: Secondary | ICD-10-CM | POA: Diagnosis not present

## 2013-05-11 DIAGNOSIS — I480 Paroxysmal atrial fibrillation: Secondary | ICD-10-CM

## 2013-05-11 DIAGNOSIS — I4891 Unspecified atrial fibrillation: Secondary | ICD-10-CM

## 2013-05-11 DIAGNOSIS — I4729 Other ventricular tachycardia: Secondary | ICD-10-CM

## 2013-05-11 DIAGNOSIS — J309 Allergic rhinitis, unspecified: Secondary | ICD-10-CM | POA: Diagnosis not present

## 2013-05-11 DIAGNOSIS — I498 Other specified cardiac arrhythmias: Secondary | ICD-10-CM

## 2013-05-11 DIAGNOSIS — R001 Bradycardia, unspecified: Secondary | ICD-10-CM

## 2013-05-11 MED ORDER — CARVEDILOL 25 MG PO TABS: 25.0000 mg | ORAL_TABLET | Freq: Two times a day (BID) | ORAL | Status: DC

## 2013-05-11 MED ORDER — AMIODARONE HCL 200 MG PO TABS: 200.0000 mg | ORAL_TABLET | Freq: Every day | ORAL | Status: DC

## 2013-05-11 NOTE — Assessment & Plan Note (Signed)
Patient is well compensated with no signs of acute heart failure exacerbation.

## 2013-05-11 NOTE — Telephone Encounter (Signed)
Pt has already been scheduled in [facility] office to be seen today at 1:40pm w/ Tarri Fuller, PA-C.

## 2013-05-11 NOTE — Patient Instructions (Signed)
1.  Start amiodarone 200mg . 2.  Decreased coreg to 25mg  twice daily. 3.  Follow up with [name] in May

## 2013-05-11 NOTE — Assessment & Plan Note (Signed)
Per previous plan as the patient appears to be having continued paroxysmal intrafibrillation will start amiodarone 200 mg daily and decrease Coreg to 25 mg twice a day. He may need further decreasing of coreg dose in the future.

## 2013-05-11 NOTE — Progress Notes (Signed)
Date:  05/11/2013   ID:  [name], DOB [birth year], MRN [id number]  PCP:  [name], MD  Primary Cardiologist:  [name]      History of Present Illness: [name] is a 73 y.o. male who has a history of an ischemic cardiomyopathy and suffered a large anterior wall myocardial infarction in 1992. In September of 1998 he underwent CABG revascularization surgery after an unsuccessful attempt at stenting his proximal LAD by [name]. He underwent a 2-year followup nuclear perfusion scan on December 05, 2011. This showed a large area of scar in the LAD territory. There was no ischemia. The patient's last echo was Dec 2014- EF 25-30%.   In 2002 he underwent initial ICD implantation for nonsustained VT documented on event monitor. In January 2010 he underwent a generator change and has now a Medtronic Virtuoso single-chamber cardioverter defibrillator.  Additional problems include paroxysmal atrial fibrillation and occasional PVCs. He has not had problems with CHF and he has been active. He walks for an hour every day. He has recently had recurrent episodes of PAF. His Coreg was increased to 37.5 mg BID a couple of months ago, but then later decreased back to 25 mg BID. He has baseline bradycardia. He was recnetly seen in the ER 04/21/13 and noted to be in rapid AF.  His in the office today for follow up. He is in sinus bradycardia 50 beats per minute on Coreg 37.5 mg BID. Mr. [name] discussed the case with Dr [name] at his last office visit. Per their discussion, if he has recurrent PAF, we will start Amiodarone 200 mg daily and decrease his Coreg to 25 mg BID.  Patient reports that he feels sluggish today and had an episode he thinks of atrial fibrillation over the weekend in which his heart rate was about 85. He may feel slightly dizzy but is ever feel like his pass out  The patient currently denies nausea, vomiting, fever, chest pain, shortness of breath, orthopnea, dizziness, PND, cough,  congestion, abdominal pain, hematochezia, melena, lower extremity edema, claudication.  Wt Readings from Last 3 Encounters:  05/11/13 161 lb (73.029 kg)  04/29/13 159 lb (72.122 kg)  04/21/13 158 lb (71.668 kg)     Past Medical History  Diagnosis Date  . Coronary artery disease     Hx MI 1992, CABG 1998 , Nuc study 11.2013 large scar but no ischemia  . 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 Atrial fib/flutter, transient   . GERD (gastroesophageal reflux disease)   . H. pylori infection     Hx of   . Cardiomyopathy, ischemic 2011    with EF 25-35% by echo  . H/O myocardial infarction, greater than 8 weeks 1992    large ant wall injury  . NSVT (nonsustained ventricular tachycardia)     Current Outpatient Prescriptions  Medication Sig Dispense Refill  . acetaminophen (TYLENOL) 650 MG CR tablet Take 650 mg by mouth 2 (two) times daily.       Marland Kitchen allopurinol (ZYLOPRIM) 300 MG tablet Take 150 mg by mouth daily.       Marland Kitchen aspirin 81 MG tablet Take 81 mg by mouth every other day.       . carvedilol (COREG) 25 MG tablet Take 1 tablet (25 mg total) by mouth 2 (two) times daily with a meal.  60 tablet  5  . cetirizine (ZYRTEC) 10 MG tablet Take 10 mg by  mouth daily.        Marland Kitchen dicyclomine (BENTYL) 10 MG capsule Take 1 capsule (10 mg total) by mouth 2 (two) times daily as needed.  90 capsule  2  . digoxin (LANOXIN) 0.125 MG tablet Take 0.5 tablets (62.5 mcg total) by mouth daily.  90 tablet  3  . esomeprazole (NEXIUM) 40 MG capsule Take 1 capsule (40 mg total) by mouth 2 (two) times daily before a meal.  60 capsule  11  . fish oil-omega-3 fatty acids 1000 MG capsule Take 1 g by mouth daily.      . isosorbide mononitrate (IMDUR) 30 MG 24 hr tablet 1/2 tablet daily.      Marland Kitchen lisinopril (PRINIVIL) 20 MG tablet Take 0.5 tablets (10 mg total) by mouth 2 (two) times daily.  90 tablet  3  . LORazepam (ATIVAN) 1 MG tablet Take 1 mg by mouth as needed.         . mometasone (ASMANEX) 220 MCG/INH inhaler Inhale 1 puff into the lungs daily.       . mometasone (NASONEX) 50 MCG/ACT nasal spray Place 2 sprays into the nose daily.      . montelukast (SINGULAIR) 10 MG tablet Take 1 tablet (10 mg total) by mouth at bedtime.  90 tablet  3  . nitroGLYCERIN (NITROSTAT) 0.4 MG SL tablet Place 0.4 mg under the tongue every 5 (five) minutes as needed for chest pain.      . silodosin (RAPAFLO) 4 MG CAPS capsule Take 8 mg by mouth. Every 2 days      . simvastatin (ZOCOR) 40 MG tablet Take 1 tablet (40 mg total) by mouth at bedtime.  90 tablet  3  . spironolactone (ALDACTONE) 25 MG tablet Take 0.5 tablets (12.5 mg total) by mouth daily.  90 tablet  3  . vitamin B-12 (CYANOCOBALAMIN) 50 MCG tablet Take 50 mcg by mouth daily.      Marland Kitchen warfarin (COUMADIN) 5 MG tablet Take 2.5-5 mg by mouth daily. Takes 5mg  on Sunday, Monday, Wednesday, and Friday. Takes 2.5mg  on Tuesday, Thursday, and Saturday      . amiodarone (PACERONE) 200 MG tablet Take 1 tablet (200 mg total) by mouth daily.  90 tablet  3   No current facility-administered medications for this visit.    Allergies:    Allergies  Allergen Reactions  . Amoxicillin     REACTION: unspecified  . Penicillins     REACTION: unspecified    Social History:  The patient  reports that he has never smoked. He has never used smokeless tobacco. He reports that he drinks alcohol. He reports that he does not use illicit drugs.   Family history:   Family History  Problem Relation Age of Onset  . Colon cancer Neg Hx   . Heart disease Father     questionable  . Stroke Mother   . Heart failure Sister   . Healthy Brother   . Healthy Sister     ROS:  Please see the history of present illness.  All other systems reviewed and negative.   PHYSICAL EXAM: VS:  BP 116/72  Pulse 50  Ht 5\' 7"  (1.702 m)  Wt 161 lb (73.029 kg)  BMI 25.21 kg/m2 Well nourished, well developed, in no acute distress HEENT: Pupils are equal round  react to light accommodation extraocular movements are intact.  Neck: no JVDNo cervical lymphadenopathy. Cardiac: Regular rate and rhythm without murmurs rubs or gallops. Lungs:  clear to auscultation bilaterally,  no wheezing, rhonchi or rales Abd: soft, nontender, positive bowel sounds all quadrants, no hepatosplenomegaly Ext: no lower extremity edema.  2+ radial and dorsalis pedis pulses. Skin: warm and dry Neuro:  Grossly normal  EKG:  Sinus bradycardia rate 50 beats per minute  ASSESSMENT AND PLAN:  Problem List Items Addressed This Visit   CARDIOMYOPATHY, ISCHEMIC- last EF 25-30% Dec 2014 (Chronic)     Patient is well compensated with no signs of acute heart failure exacerbation.    Relevant Medications      amiodarone (PACERONE) tablet      carvedilol (COREG) tablet   PAF (paroxysmal atrial fibrillation) (Chronic)     Per previous plan as the patient appears to be having continued paroxysmal intrafibrillation will start amiodarone 200 mg daily and decrease Coreg to 25 mg twice a day. He may need further decreasing of coreg dose in the future.      Relevant Medications      amiodarone (PACERONE) tablet      carvedilol (COREG) tablet   SYSTOLIC HEART FAILURE, CHRONIC   Relevant Medications      amiodarone (PACERONE) tablet      carvedilol (COREG) tablet    Other Visit Diagnoses   Atrial fibrillation    -  Primary    Relevant Medications       amiodarone (PACERONE) tablet       carvedilol (COREG) tablet    Other Relevant Orders       EKG 12-Lead

## 2013-05-13 LAB — MDC_IDC_ENUM_SESS_TYPE_REMOTE
Date Time Interrogation Session: 20150414113251
HIGH POWER IMPEDANCE MEASURED VALUE: 47 Ohm
HIGH POWER IMPEDANCE MEASURED VALUE: 66 Ohm
Lead Channel Impedance Value: 1152 Ohm
Lead Channel Sensing Intrinsic Amplitude: 16.3072
Lead Channel Setting Pacing Amplitude: 2.5 V
Lead Channel Setting Sensing Sensitivity: 0.3 mV
MDC IDC MSMT BATTERY VOLTAGE: 3.04 V
MDC IDC SET LEADCHNL RV PACING PULSEWIDTH: 0.4 ms
MDC IDC SET ZONE DETECTION INTERVAL: 300 ms
MDC IDC SET ZONE DETECTION INTERVAL: 340 ms
MDC IDC STAT BRADY RV PERCENT PACED: 0.25 %
Zone Setting Detection Interval: 240 ms
Zone Setting Detection Interval: 450 ms

## 2013-05-18 DIAGNOSIS — J309 Allergic rhinitis, unspecified: Secondary | ICD-10-CM | POA: Diagnosis not present

## 2013-05-19 ENCOUNTER — Ambulatory Visit (INDEPENDENT_AMBULATORY_CARE_PROVIDER_SITE_OTHER): Payer: Medicare Other | Admitting: Pharmacist Clinician (PhC)/ Clinical Pharmacy Specialist

## 2013-05-19 ENCOUNTER — Telehealth: Payer: Self-pay | Admitting: Internal Medicine

## 2013-05-19 VITALS — BP 110/60 | HR 48

## 2013-05-19 DIAGNOSIS — I4891 Unspecified atrial fibrillation: Secondary | ICD-10-CM | POA: Diagnosis not present

## 2013-05-19 DIAGNOSIS — Z7901 Long term (current) use of anticoagulants: Secondary | ICD-10-CM

## 2013-05-19 DIAGNOSIS — I480 Paroxysmal atrial fibrillation: Secondary | ICD-10-CM

## 2013-05-19 LAB — POCT INR: INR: 3.6

## 2013-05-19 NOTE — Telephone Encounter (Signed)
New message     Did we get his remote device transmission report?

## 2013-05-19 NOTE — Telephone Encounter (Signed)
Transmission received, patient aware. 

## 2013-05-23 ENCOUNTER — Telehealth: Payer: Self-pay | Admitting: Nurse Practitioner

## 2013-05-23 NOTE — Telephone Encounter (Signed)
Pt called this afternoon to report that since being placed on amiodarone, in conjunction with coreg 25mg  bid, he has been feeling very sluggish and his HR's have been in the high 40's.  His BP's have been running in the 130's.  I recommended that he reduce his coreg dose to 12.5mg  bid.  He knows how to check his pulse and I advised that if his HR is <50 prior to a scheduled dose of coreg, that he should hold that dose.  He verbalized understanding of directions and will call back if necessary.

## 2013-05-25 DIAGNOSIS — J309 Allergic rhinitis, unspecified: Secondary | ICD-10-CM | POA: Diagnosis not present

## 2013-05-26 ENCOUNTER — Telehealth: Payer: Self-pay | Admitting: Cardiovascular Disease

## 2013-05-26 DIAGNOSIS — J0100 Acute maxillary sinusitis, unspecified: Secondary | ICD-10-CM | POA: Diagnosis not present

## 2013-05-26 DIAGNOSIS — J310 Chronic rhinitis: Secondary | ICD-10-CM | POA: Diagnosis not present

## 2013-05-26 DIAGNOSIS — H6120 Impacted cerumen, unspecified ear: Secondary | ICD-10-CM | POA: Diagnosis not present

## 2013-05-26 DIAGNOSIS — J330 Polyp of nasal cavity: Secondary | ICD-10-CM | POA: Diagnosis not present

## 2013-05-26 DIAGNOSIS — H903 Sensorineural hearing loss, bilateral: Secondary | ICD-10-CM | POA: Diagnosis not present

## 2013-05-26 NOTE — Telephone Encounter (Signed)
Call to pt and informed [name] agreed w/ plan.  Verbalized understanding.

## 2013-05-26 NOTE — Telephone Encounter (Signed)
Pt says his heart rate is down low,he have stop taking his Coreg.Please call and advise him on what to do.

## 2013-05-26 NOTE — Telephone Encounter (Signed)
Returned call and pt verified x 2.  Pt stated lately his pulse has been low: 48, 44 today.  Took other meds and not Coreg.  Stated he normally takes 25 mg twice daily and a few weeks ago it was suggested he take 37.5 mg twice daily.  Stated he talked to Ignacia Bayley, NP a couple of days ago and was told to hold Coreg if HR < 50 or if over 50 take 12.5 mg Coreg.  Pt stated he did do that, but last night his HR was in low 50s and he took 25 mg Coreg.  Pt informed he should have taken 12.5 mg for HR >50 and hold if < 50.  RN explained directions to pt again and pt stated he was writing them down.   Advice  Take Coreg 12.5 mg (1/2 tab) if HR > 50  HOLD Coreg if HR <50  Continue to monitor for next 48 hrs  Keep appt on Monday w/ [name]  Call back sooner if needed  Pt verbalized understanding and agreed w/ plan.

## 2013-05-26 NOTE — Telephone Encounter (Signed)
agree with above recommendations

## 2013-05-27 DIAGNOSIS — J309 Allergic rhinitis, unspecified: Secondary | ICD-10-CM | POA: Diagnosis not present

## 2013-05-28 ENCOUNTER — Encounter: Payer: Self-pay | Admitting: *Deleted

## 2013-06-01 ENCOUNTER — Ambulatory Visit (INDEPENDENT_AMBULATORY_CARE_PROVIDER_SITE_OTHER): Payer: Medicare Other | Admitting: Pharmacist Clinician (PhC)/ Clinical Pharmacy Specialist

## 2013-06-01 ENCOUNTER — Ambulatory Visit (INDEPENDENT_AMBULATORY_CARE_PROVIDER_SITE_OTHER): Payer: Medicare Other | Admitting: Cardiovascular Disease

## 2013-06-01 ENCOUNTER — Encounter: Payer: Self-pay | Admitting: Cardiovascular Disease

## 2013-06-01 VITALS — BP 112/74 | HR 49 | Ht 67.0 in | Wt 158.2 lb

## 2013-06-01 DIAGNOSIS — I2510 Atherosclerotic heart disease of native coronary artery without angina pectoris: Secondary | ICD-10-CM

## 2013-06-01 DIAGNOSIS — I2589 Other forms of chronic ischemic heart disease: Secondary | ICD-10-CM | POA: Diagnosis not present

## 2013-06-01 DIAGNOSIS — I480 Paroxysmal atrial fibrillation: Secondary | ICD-10-CM

## 2013-06-01 DIAGNOSIS — I493 Ventricular premature depolarization: Secondary | ICD-10-CM

## 2013-06-01 DIAGNOSIS — I4949 Other premature depolarization: Secondary | ICD-10-CM

## 2013-06-01 DIAGNOSIS — Z95810 Presence of automatic (implantable) cardiac defibrillator: Secondary | ICD-10-CM

## 2013-06-01 DIAGNOSIS — R5383 Other fatigue: Secondary | ICD-10-CM

## 2013-06-01 DIAGNOSIS — I4891 Unspecified atrial fibrillation: Secondary | ICD-10-CM

## 2013-06-01 DIAGNOSIS — E785 Hyperlipidemia, unspecified: Secondary | ICD-10-CM

## 2013-06-01 DIAGNOSIS — Z7901 Long term (current) use of anticoagulants: Secondary | ICD-10-CM

## 2013-06-01 DIAGNOSIS — J309 Allergic rhinitis, unspecified: Secondary | ICD-10-CM | POA: Diagnosis not present

## 2013-06-01 DIAGNOSIS — R5381 Other malaise: Secondary | ICD-10-CM

## 2013-06-01 DIAGNOSIS — I498 Other specified cardiac arrhythmias: Secondary | ICD-10-CM

## 2013-06-01 DIAGNOSIS — J45909 Unspecified asthma, uncomplicated: Secondary | ICD-10-CM

## 2013-06-01 DIAGNOSIS — R002 Palpitations: Secondary | ICD-10-CM

## 2013-06-01 DIAGNOSIS — R001 Bradycardia, unspecified: Secondary | ICD-10-CM

## 2013-06-01 LAB — POCT INR: INR: 2.9

## 2013-06-01 MED ORDER — CARVEDILOL 6.25 MG PO TABS: ORAL_TABLET | ORAL | Status: DC

## 2013-06-01 NOTE — Patient Instructions (Addendum)
Your physician recommends that you schedule a follow-up appointment in: 4 months.  Your physician has recommended you make the following change in your medication:  carvedilol 6.25 mg twice daily.  Your physician recommends that you return for lab work fasting.

## 2013-06-01 NOTE — Telephone Encounter (Signed)
Encounter Closed---06/01/13 TP 

## 2013-06-01 NOTE — Progress Notes (Signed)
Patient ID: [name], male   DOB: August 28, 1940, 73 y.o.   MRN: [id number]      HPI: [name] a 73 y.o. male with a history of an ischemic cardiomyopathy secondary to suffering a large anterior wall myocardial infarction in 1992. In September 1998 he underwent CABG revascularization surgery after an unsuccessful attempt at stenting of his proximal LAD by [name]. Ejection fraction was 25-30%. In 2002 he underwent initial ICD implantation for nonsustained ventricular tachycardia documented on event monitor for primary prevention. In January 2010 he underwent generator change with a  Medtronic Virtuoso single chamber cardioverter defibrillator. Additional problems include paroxysmal atrial fibrillation, occasional PVCs and an attempt is made in the past 2 overdrive suppress his PVCs with reducing his beta blocker therapy patient felt he had PVCs on the reduced dose and therefore has been on the higher dose. Due to bradycardia, his  Lanoxin dose was also reduced.  His last nuclear perfusion study was in November 2013 which showed a large area of scar in the LAD territory (extent 44%) involving the mid to apical anterior,  apical and infero-apical to mid infero-septal and apical lateral wall without associated ischemia.  When I saw [name] in June 2014 he was complaining of mild tenderness in the breast region. I felt this possibly could be related to his Spironolactone and consequently recommended a trial of Inspra 25 mg to see if this could improve his symptoms. He was seen in the emergency room for presyncope in July and was concerned that his heart was beating erratically. In the emergency room his ICD was interrogated and no arrhythmias were noted. His vital signs were stable. He had  normal troponin. Patient preferred discontinuing the Inspra particularly due to cost and resumed  spironolactone again. He feels that he seems to tolerate this better.   His last echo Doppler study was done on  01/01/2013. His left ventricle was mildly dilated. Ejection fraction was 25-30%. There was dyskinesis in scarring of the apical myocardium as well as akinesis of the mid anteroseptal, anterior and anterolateral walls consistent with his prior LAD infarction. He does have a history of paroxysmal atrial fibrillation. Recently, his view he feels that his heart rhythm is stable. He presents for evaluation.  Presently, his pulse has been in the 40s.  Oftentimes, he has been not taking carvedilol and had previously been instructed to hold this if his pulse couple of 50, but to take 12.5 mg if his pulse was above 50 and previous dose of 25 mg of above 60.  He is only been taking this intermittently and at times.  There have been days when he is not taking any.  He does note some fatigue.  He is not aware of any further palpitations or breakthrough atrial fibrillation.  He is not aware of having  any defibrillator discharge.   Past Medical History  Diagnosis Date  . Coronary artery disease     Hx MI 1992, CABG 1998 , Nuc study 11.2013 large scar but no ischemia  . 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 Atrial fib/flutter, transient   . GERD (gastroesophageal reflux disease)   . H. pylori infection     Hx of   . Cardiomyopathy, ischemic 2011    with EF 25-35% by echo  . H/O myocardial infarction, greater than 8 weeks 1992    large ant wall injury  . NSVT (nonsustained ventricular tachycardia)  Past Surgical History  Procedure Laterality Date  . Nasal sinus surgery  05/31/2010    Dr. Benjamine Mola  . Coronary artery bypass graft  1998    x 5  . Cataract extraction, bilateral    . Knee surgery      right  . Hernia repair    . Icd generator change  01/2008    medtronic, hx+ EP study    Allergies  Allergen Reactions  . Amoxicillin     REACTION: unspecified  . Penicillins     REACTION: unspecified    Current Outpatient Prescriptions  Medication  Sig Dispense Refill  . acetaminophen (TYLENOL) 650 MG CR tablet Take 650 mg by mouth 2 (two) times daily.       Marland Kitchen allopurinol (ZYLOPRIM) 300 MG tablet Take 150 mg by mouth daily.       Marland Kitchen amiodarone (PACERONE) 200 MG tablet Take 1 tablet (200 mg total) by mouth daily.  90 tablet  3  . aspirin 81 MG tablet Take 81 mg by mouth every other day.       . carvedilol (COREG) 6.25 MG tablet Take 1 tablet twice daily  180 tablet  3  . cetirizine (ZYRTEC) 10 MG tablet Take 10 mg by mouth daily.        Marland Kitchen dicyclomine (BENTYL) 10 MG capsule Take 1 capsule (10 mg total) by mouth 2 (two) times daily as needed.  90 capsule  2  . digoxin (LANOXIN) 0.125 MG tablet Take 0.5 tablets (62.5 mcg total) by mouth daily.  90 tablet  3  . esomeprazole (NEXIUM) 40 MG capsule Take 1 capsule (40 mg total) by mouth 2 (two) times daily before a meal.  60 capsule  11  . fish oil-omega-3 fatty acids 1000 MG capsule Take 1 g by mouth daily.      . isosorbide mononitrate (IMDUR) 30 MG 24 hr tablet 1/2 tablet daily.      Marland Kitchen lisinopril (PRINIVIL) 20 MG tablet Take 0.5 tablets (10 mg total) by mouth 2 (two) times daily.  90 tablet  3  . LORazepam (ATIVAN) 1 MG tablet Take 1 mg by mouth as needed.       . mometasone (ASMANEX) 220 MCG/INH inhaler Inhale 1 puff into the lungs daily.       . mometasone (NASONEX) 50 MCG/ACT nasal spray Place 2 sprays into the nose daily.      . montelukast (SINGULAIR) 10 MG tablet Take 1 tablet (10 mg total) by mouth at bedtime.  90 tablet  3  . nitroGLYCERIN (NITROSTAT) 0.4 MG SL tablet Place 0.4 mg under the tongue every 5 (five) minutes as needed for chest pain.      . silodosin (RAPAFLO) 4 MG CAPS capsule Take 8 mg by mouth. Every 2 days      . simvastatin (ZOCOR) 40 MG tablet Take 1 tablet (40 mg total) by mouth at bedtime.  90 tablet  3  . spironolactone (ALDACTONE) 25 MG tablet Take 0.5 tablets (12.5 mg total) by mouth daily.  90 tablet  3  . vitamin B-12 (CYANOCOBALAMIN) 50 MCG tablet Take 50 mcg by  mouth daily.      Marland Kitchen warfarin (COUMADIN) 5 MG tablet Take 2.5-5 mg by mouth daily. Takes 5mg  on Sunday, Monday, Wednesday, and Friday. Takes 2.5mg  on Tuesday, Thursday, and Saturday       No current facility-administered medications for this visit.    Socially he is married and has 3 children and 5 grandchildren. He  does exercise most recently with walking. He used to play tennis. There is no tobacco or alcohol use.  ROS he denies fever chills or night sweats. He denies change in weight. He denies change in vision or hearing. He is unaware of lymphadenopathy. At times he does get sinus congestion. He is unaware of any recurrent atrial fibrillation. He did not have any episodes of VT noted on his  ICD interrogation He denies angina. He denies PND orthopnea. He denies bleeding. Marland Kitchen He states his chest and breast region is no longer sore .  He is tolerating low dose spironolactone. He denies abdominal pain, nausea vomiting or diarrhea. He is unaware of blood in stool or urine. He denies claudication. There are no myalgias. There is no history of diabetes or thyroid abnormality.  He denies difficulty with  sleep.  Other comprehensive 14 point system review is negative.  PE BP 112/74  Pulse 49  Ht 5\' 7"  (1.702 m)  Wt 158 lb 3.2 oz (71.759 kg)  BMI 24.77 kg/m2 repeat supine blood pressure 120/70 and standing blood pressure 110/70, without orthostatic change. General: Alert, oriented, no distress.  Skin: normal turgor, no rashes HEENT: Normocephalic, atraumatic. Pupils round and reactive; sclera anicteric;no lid lag,  Nose without nasal septal hypertrophy Mouth/Parynx benign; Mallinpatti scale 2 Neck: No JVD, no carotid bruits; normal carotid upstroke Chest wall: No tenderness to palpation Lungs: clear to ausculatation and percussion; no wheezing or rales Heart: RRR, s1 s2 normal [id number] systolic murmur, unchanged.  No diastolic murmur No S3 gallop. No heave or parasternal lift. No rub. Abdomen: soft,  nontender; no hepatosplenomehaly, BS+; abdominal aorta nontender and not dilated by palpation. Back: No CVA tenderness Pulses 2+ Extremities: no clubbing cyanosis or edema, Homan's sign negative  Neurologic: grossly nonfocal; cranial nerves normal Psychological: Normal affect and mood.  ECG (independently read by me): Sinus bradycardia at 49 beats per minute.  No ectopy.  QTc interval 45 ms.  ECG (independently read by me): Sinus bradycardia 52 beats per minute. Old anterior lateral myocardial infarction. Nonspecific interventricular conduction delay. Previously noted T-wave changes.  ECG from 08/28/2012: Sinus bradycardia 52 beats per minute. No ectopy. Diffuse T wave abnormality the 4 through V6 1 and L. Poor R-wave progression compatible with prior anterior wall MI.  LABS:  BMET    Component Value Date/Time   NA 135* 04/21/2013 1426   K 4.3 04/21/2013 1426   CL 98 04/21/2013 1426   CO2 25 04/21/2013 1426   GLUCOSE 84 04/21/2013 1426   BUN 14 04/21/2013 1426   CREATININE 0.75 04/21/2013 1426   CREATININE 0.84 02/17/2013 0846   CALCIUM 9.1 04/21/2013 1426   GFRNONAA 89* 04/21/2013 1426   GFRAA >90 04/21/2013 1426     Hepatic Function Panel     Component Value Date/Time   PROT 6.9 02/17/2013 0846   ALBUMIN 4.1 02/17/2013 0846   AST 18 02/17/2013 0846   ALT 16 02/17/2013 0846   ALKPHOS 45 02/17/2013 0846   BILITOT 0.7 02/17/2013 0846   BILIDIR 0.2 11/07/2012 1509     CBC    Component Value Date/Time   WBC 10.5 04/21/2013 1426   RBC 5.31 04/21/2013 1426   HGB 15.6 04/21/2013 1426   HCT 46.0 04/21/2013 1426   PLT 228 04/21/2013 1426   MCV 86.6 04/21/2013 1426   MCH 29.4 04/21/2013 1426   MCHC 33.9 04/21/2013 1426   RDW 14.5 04/21/2013 1426   LYMPHSABS 1.6 11/07/2012 1509   MONOABS 0.9 11/07/2012 1509  EOSABS 0.3 11/07/2012 1509   BASOSABS 0.0 11/07/2012 1509     BNP No results found for this basename: probnp    Lipid Panel     Component Value Date/Time   CHOL 127 02/17/2013  0846     RADIOLOGY: No results found.    ASSESSMENT AND PLAN:  Jason. Modena Slater suffered a large anterior wall myocardial infarction and has ischemic cardiomyopathy with an ejection fraction in the 25-35% range. His last nuclear perfusion study in November 2013 was nonischemic. He is doing well without CHF symptoms. he is now on amiodarone at 200 mg daily and has not been aware of any recurrent atrial fibrillation.  His carvedilol dose has been only intermittently received an oftentimes he does not take the pill on a given day 2.  His pulse.  Rather than take it in this regimen, I suggested that he back down to carvedilol dose to 6.25 twice a day and take this twice a day as long as his resting pulse not consistently get below 50.  He is on a reduced dose of Lanoxin at 0.0625 mg and is doing well.  His blood pressure today is controlled and her repeat by me was 118/70.  He is now back on Aldactone in place of Kerlone, due to cost, and seems to be tolerating this well without breast tissue soreness.  He's not having any edema.  He does have a history of gout without recent flare.  He is on Coumadin anticoagulation.  I am recommending followup laboratory be obtained  and I will see him back in the office for followup evaluation in 4 months or sooner if problems arise.  Troy Sine, MD, [facility]  06/01/2013 7:36 PM

## 2013-06-02 LAB — CBC
HEMATOCRIT: 42.7 % (ref 39.0–52.0)
Hemoglobin: 14.3 g/dL (ref 13.0–17.0)
MCH: 27.9 pg (ref 26.0–34.0)
MCHC: 33.5 g/dL (ref 30.0–36.0)
MCV: 83.4 fL (ref 78.0–100.0)
PLATELETS: 303 10*3/uL (ref 150–400)
RBC: 5.12 MIL/uL (ref 4.22–5.81)
RDW: 14.8 % (ref 11.5–15.5)
WBC: 7.7 10*3/uL (ref 4.0–10.5)

## 2013-06-02 LAB — COMPREHENSIVE METABOLIC PANEL
ALT: 14 U/L (ref 0–53)
AST: 17 U/L (ref 0–37)
Albumin: 4 g/dL (ref 3.5–5.2)
Alkaline Phosphatase: 45 U/L (ref 39–117)
BILIRUBIN TOTAL: 0.6 mg/dL (ref 0.2–1.2)
BUN: 9 mg/dL (ref 6–23)
CALCIUM: 9.1 mg/dL (ref 8.4–10.5)
CHLORIDE: 103 meq/L (ref 96–112)
CO2: 25 mEq/L (ref 19–32)
CREATININE: 0.91 mg/dL (ref 0.50–1.35)
Glucose, Bld: 93 mg/dL (ref 70–99)
Potassium: 4 mEq/L (ref 3.5–5.3)
Sodium: 137 mEq/L (ref 135–145)
Total Protein: 6.4 g/dL (ref 6.0–8.3)

## 2013-06-02 LAB — LIPID PANEL
Cholesterol: 111 mg/dL (ref 0–200)
HDL: 29 mg/dL — AB (ref >39)
LDL Cholesterol: 61 mg/dL (ref 0–99)
Total CHOL/HDL Ratio: 3.8 Ratio
Triglycerides: 104 mg/dL (ref <150)
VLDL: 21 mg/dL (ref 0–40)

## 2013-06-02 LAB — TSH: TSH: 3.152 u[IU]/mL (ref 0.350–4.500)

## 2013-06-11 DIAGNOSIS — J309 Allergic rhinitis, unspecified: Secondary | ICD-10-CM | POA: Diagnosis not present

## 2013-06-12 ENCOUNTER — Encounter: Payer: Self-pay | Admitting: Internal Medicine

## 2013-06-12 ENCOUNTER — Other Ambulatory Visit: Payer: Self-pay | Admitting: Internal Medicine

## 2013-06-12 ENCOUNTER — Ambulatory Visit (INDEPENDENT_AMBULATORY_CARE_PROVIDER_SITE_OTHER): Payer: Medicare Other | Admitting: Internal Medicine

## 2013-06-12 VITALS — BP 127/67 | HR 49 | Ht 67.0 in | Wt 160.0 lb

## 2013-06-12 DIAGNOSIS — I4891 Unspecified atrial fibrillation: Secondary | ICD-10-CM | POA: Diagnosis not present

## 2013-06-12 DIAGNOSIS — I2589 Other forms of chronic ischemic heart disease: Secondary | ICD-10-CM | POA: Diagnosis not present

## 2013-06-12 DIAGNOSIS — R001 Bradycardia, unspecified: Secondary | ICD-10-CM

## 2013-06-12 DIAGNOSIS — I5022 Chronic systolic (congestive) heart failure: Secondary | ICD-10-CM | POA: Diagnosis not present

## 2013-06-12 DIAGNOSIS — I480 Paroxysmal atrial fibrillation: Secondary | ICD-10-CM

## 2013-06-12 DIAGNOSIS — I255 Ischemic cardiomyopathy: Secondary | ICD-10-CM

## 2013-06-12 DIAGNOSIS — Z95810 Presence of automatic (implantable) cardiac defibrillator: Secondary | ICD-10-CM

## 2013-06-12 DIAGNOSIS — I498 Other specified cardiac arrhythmias: Secondary | ICD-10-CM | POA: Diagnosis not present

## 2013-06-12 LAB — MDC_IDC_ENUM_SESS_TYPE_INCLINIC

## 2013-06-12 NOTE — Patient Instructions (Addendum)
STOP Aspirin STOP Coreg STOP Digoxin  Lab work today We will call you with results  Your physician wants you to follow-up in: 1 year You will receive a reminder letter in the mail two months in advance. If you don't receive a letter, please call our office to schedule the follow-up appointment.

## 2013-06-12 NOTE — Progress Notes (Signed)
Patient Care Team: [name], MD as PCP - General (Family Medicine)   HPI  [name] is a 73 y.o. 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 He has a history of bypass surgery.  His last nuclear perfusion study was in November 2013 which showed a large area of scar in the LAD territory (extent 44%) involving the mid to apical anterior, apical and infero-apical to mid infero-septal and apical lateral wall without associated ischemia. His last echo Doppler study was done on 01/01/2013. His left ventricle was mildly dilated. Ejection fraction was 25-30%. There was dyskinesis in scarring of the apical myocardium as well as akinesis of the mid anteroseptal, anterior and anterolateral walls consistent with his prior LAD infarction.    He has a history of paroxysmal atrial fibrillation and is on warfarin  This has been increasingly problematic and prompted Dr. Angelique Holm to put him on amiodarone. He has had progressive problems with bradycardia associated with increasing sluggishness.   He has had no intercurrent discharges. there has been no shortness of breath. There has been no peripheral edema. and no chest pain.  He has worseing of peripheral vision dating back to cabg He is scheduled neuroopthomolagist   Past Medical History  Diagnosis Date  . Coronary artery disease     Hx MI 1992, CABG 1998 , Nuc study 11.2013 large scar but no ischemia  . 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 Atrial fib/flutter, transient   . GERD (gastroesophageal reflux disease)   . H. pylori infection     Hx of   . Cardiomyopathy, ischemic 2011    with EF 25-35% by echo  . H/O myocardial infarction, greater than 8 weeks 1992    large ant wall injury  . NSVT (nonsustained ventricular tachycardia)     Past Surgical History  Procedure Laterality Date  . Nasal sinus surgery  05/31/2010    Dr. Benjamine Mola   . Coronary artery bypass graft  1998    x 5  . Cataract extraction, bilateral    . Knee surgery      right  . Hernia repair    . Icd generator change  01/2008    medtronic, hx+ EP study    Current Outpatient Prescriptions  Medication Sig Dispense Refill  . acetaminophen (TYLENOL) 650 MG CR tablet Take 650 mg by mouth 2 (two) times daily.       Marland Kitchen allopurinol (ZYLOPRIM) 300 MG tablet Take 150 mg by mouth daily.       Marland Kitchen amiodarone (PACERONE) 200 MG tablet Take 1 tablet (200 mg total) by mouth daily.  90 tablet  3  . aspirin 81 MG tablet Take 81 mg by mouth every other day.       . carvedilol (COREG) 6.25 MG tablet Take 1 tablet twice daily  180 tablet  3  . cetirizine (ZYRTEC) 10 MG tablet Take 10 mg by mouth daily.        Marland Kitchen dicyclomine (BENTYL) 10 MG capsule Take 1 capsule (10 mg total) by mouth 2 (two) times daily as needed.  90 capsule  2  . digoxin (LANOXIN) 0.125 MG tablet Take 0.5 tablets (62.5 mcg total) by mouth daily.  90 tablet  3  . esomeprazole (NEXIUM) 40 MG capsule Take 1 capsule (40 mg total) by mouth 2 (two) times daily before a meal.  60 capsule  11  .  fish oil-omega-3 fatty acids 1000 MG capsule Take 1 g by mouth daily.      . isosorbide mononitrate (IMDUR) 30 MG 24 hr tablet 1/2 tablet daily.      Marland Kitchen lisinopril (PRINIVIL) 20 MG tablet Take 0.5 tablets (10 mg total) by mouth 2 (two) times daily.  90 tablet  3  . LORazepam (ATIVAN) 1 MG tablet Take 1 mg by mouth as needed.       . mometasone (ASMANEX) 220 MCG/INH inhaler Inhale 1 puff into the lungs daily.       . mometasone (NASONEX) 50 MCG/ACT nasal spray Place 2 sprays into the nose daily.      . montelukast (SINGULAIR) 10 MG tablet Take 1 tablet (10 mg total) by mouth at bedtime.  90 tablet  3  . nitroGLYCERIN (NITROSTAT) 0.4 MG SL tablet Place 0.4 mg under the tongue every 5 (five) minutes as needed for chest pain.      . silodosin (RAPAFLO) 4 MG CAPS capsule Take 8 mg by mouth. Every 2 days      . simvastatin (ZOCOR)  40 MG tablet Take 1 tablet (40 mg total) by mouth at bedtime.  90 tablet  3  . spironolactone (ALDACTONE) 25 MG tablet Take 0.5 tablets (12.5 mg total) by mouth daily.  90 tablet  3  . vitamin B-12 (CYANOCOBALAMIN) 50 MCG tablet Take 50 mcg by mouth daily.      Marland Kitchen warfarin (COUMADIN) 5 MG tablet Take 2.5-5 mg by mouth daily. Takes 5mg  on Sunday, Monday, Wednesday, and Friday. Takes 2.5mg  on Tuesday, Thursday, and Saturday       No current facility-administered medications for this visit.    Allergies  Allergen Reactions  . Amoxicillin     REACTION: unspecified  . Penicillins     REACTION: unspecified    Review of Systems negative except from HPI and PMH  Physical Exam BP 127/67  Pulse 49  Ht 5\' 7"  (1.702 m)  Wt 160 lb (72.576 kg)  BMI 25.05 kg/m2 Well developed and well nourished in no acute distress HENT normal E scleral and icterus clear Neck Supple JVP flat; carotids brisk and full Clear to ausculation Slwo but Regular rate and rhythm, no murmurs gallops or rub Soft with active bowel sounds No clubbing cyanosis none Edema Alert and oriented, grossly normal motor and sensory function Skin Warm and Dry  ECG demonstrates sinus rhythm at 49 intervals 18/13/50 with a QTC of 453 Axis left -70  Assessment and  Plan  Atrial fibrillation-paroxysmal  Sinus bradycardia  Ischemic cardiac myopathy  Congestive heart failure-chronic-systolic  Defibrillator-Medtronic-VVI  [name] is struggling with paroxysms of atrial fibrillation which are quite disruptive despite what apparently is not all that faster rate. Dr. Leda Gauze has begun him on amiodarone which will likely be limited in its utility because of bradycardia. In the interim, we will discontinue his carvedilol and his digoxin as he is having symptoms related to bradycardia. We will check amiodarone level with the hope that we could potentially begin him on dofetilide which would not have the brady arrhythmic effect.  If we  cannot control symptoms this way, I would suggest that we think about upgrading his device if treadmill testing confirmed significant chronotropic incompetence.

## 2013-06-16 ENCOUNTER — Encounter: Payer: Self-pay | Admitting: Internal Medicine

## 2013-06-16 DIAGNOSIS — J309 Allergic rhinitis, unspecified: Secondary | ICD-10-CM | POA: Diagnosis not present

## 2013-06-16 LAB — AMIODARONE LEVEL
Amiodarone Lvl: 0.7 ug/mL — ABNORMAL LOW (ref 1.5–2.5)
Desethylamiodarone: 0.4 ug/mL — ABNORMAL LOW (ref 1.5–2.5)

## 2013-06-17 ENCOUNTER — Telehealth: Payer: Self-pay | Admitting: Cardiovascular Disease

## 2013-06-17 NOTE — Telephone Encounter (Signed)
Labs mailed to pt as requested.

## 2013-06-17 NOTE — Telephone Encounter (Signed)
Please mail his lab results from about 2 weeks ago.

## 2013-06-19 ENCOUNTER — Encounter: Payer: Self-pay | Admitting: *Deleted

## 2013-06-23 ENCOUNTER — Telehealth: Payer: Self-pay | Admitting: Cardiovascular Disease

## 2013-06-23 DIAGNOSIS — J309 Allergic rhinitis, unspecified: Secondary | ICD-10-CM | POA: Diagnosis not present

## 2013-06-23 NOTE — Telephone Encounter (Signed)
Would like to speak to [name] about some medications changes . Please Call   Thanks

## 2013-06-23 NOTE — Telephone Encounter (Signed)
Patient returned a call to me. He wants [name] to be made aware that Dr. [name] has stopped his digoxin, ASA, amiodarone and carvedilol. Dr. [name] would like for the patient to start tikosyn. Informed the patient that now that we are all one unit [name] can go in and read Dr. Olin Pia recommendations. I will send a note to him requesting that he does this. The patient request for [name] to call him with his thoughts concerning this. Message routed to [name].

## 2013-06-24 ENCOUNTER — Ambulatory Visit (INDEPENDENT_AMBULATORY_CARE_PROVIDER_SITE_OTHER): Payer: Medicare Other | Admitting: Pharmacist Clinician (PhC)/ Clinical Pharmacy Specialist

## 2013-06-24 DIAGNOSIS — Z7901 Long term (current) use of anticoagulants: Secondary | ICD-10-CM

## 2013-06-24 DIAGNOSIS — I480 Paroxysmal atrial fibrillation: Secondary | ICD-10-CM

## 2013-06-24 DIAGNOSIS — I4891 Unspecified atrial fibrillation: Secondary | ICD-10-CM | POA: Diagnosis not present

## 2013-06-24 LAB — POCT INR: INR: 3.9

## 2013-06-25 ENCOUNTER — Encounter: Payer: Self-pay | Admitting: Internal Medicine

## 2013-06-25 NOTE — Telephone Encounter (Signed)
If he is to start tikosyn agree with changes.  Pt does have CAD in addition to arrythmic issues, and if no bleeding issues or concerns prob OK to resume baby asa 81 mg if not daily then qod nif on warfarin.

## 2013-06-25 NOTE — Telephone Encounter (Signed)
This encounter was created in error - please disregard.

## 2013-06-25 NOTE — Telephone Encounter (Signed)
Patient is returning your call. Please call him back on his cell phone.

## 2013-06-26 ENCOUNTER — Telehealth: Payer: Self-pay | Admitting: *Deleted

## 2013-06-26 DIAGNOSIS — J309 Allergic rhinitis, unspecified: Secondary | ICD-10-CM | POA: Diagnosis not present

## 2013-06-26 NOTE — Telephone Encounter (Signed)
Informed patient [name] reviewed message and Dr. Olin Pia note. Agrees with Dr. Olin Pia POC.  Probably ok to restart ASA 81 mg qod. Patient decided to wait before restarting this. Last INR  Check was 4.0. Once stable he "may" restart ASA qod.

## 2013-06-26 NOTE — Telephone Encounter (Signed)
Opened in error

## 2013-06-26 NOTE — Telephone Encounter (Signed)
error 

## 2013-06-29 DIAGNOSIS — J309 Allergic rhinitis, unspecified: Secondary | ICD-10-CM | POA: Diagnosis not present

## 2013-06-30 DIAGNOSIS — H01009 Unspecified blepharitis unspecified eye, unspecified eyelid: Secondary | ICD-10-CM | POA: Diagnosis not present

## 2013-07-02 ENCOUNTER — Ambulatory Visit: Payer: Medicare Other | Admitting: Cardiovascular Disease

## 2013-07-02 DIAGNOSIS — J309 Allergic rhinitis, unspecified: Secondary | ICD-10-CM | POA: Diagnosis not present

## 2013-07-06 ENCOUNTER — Ambulatory Visit (INDEPENDENT_AMBULATORY_CARE_PROVIDER_SITE_OTHER): Payer: Medicare Other | Admitting: Pharmacist Clinician (PhC)/ Clinical Pharmacy Specialist

## 2013-07-06 VITALS — BP 112/70 | HR 60

## 2013-07-06 DIAGNOSIS — Z7901 Long term (current) use of anticoagulants: Secondary | ICD-10-CM

## 2013-07-06 DIAGNOSIS — I4891 Unspecified atrial fibrillation: Secondary | ICD-10-CM

## 2013-07-06 DIAGNOSIS — I480 Paroxysmal atrial fibrillation: Secondary | ICD-10-CM

## 2013-07-06 DIAGNOSIS — J309 Allergic rhinitis, unspecified: Secondary | ICD-10-CM | POA: Diagnosis not present

## 2013-07-06 LAB — POCT INR: INR: 2.2

## 2013-07-08 DIAGNOSIS — J3089 Other allergic rhinitis: Secondary | ICD-10-CM | POA: Diagnosis not present

## 2013-07-08 DIAGNOSIS — J45909 Unspecified asthma, uncomplicated: Secondary | ICD-10-CM | POA: Diagnosis not present

## 2013-07-08 DIAGNOSIS — J3081 Allergic rhinitis due to animal (cat) (dog) hair and dander: Secondary | ICD-10-CM | POA: Diagnosis not present

## 2013-07-08 DIAGNOSIS — J301 Allergic rhinitis due to pollen: Secondary | ICD-10-CM | POA: Diagnosis not present

## 2013-07-13 DIAGNOSIS — J309 Allergic rhinitis, unspecified: Secondary | ICD-10-CM | POA: Diagnosis not present

## 2013-07-20 DIAGNOSIS — J309 Allergic rhinitis, unspecified: Secondary | ICD-10-CM | POA: Diagnosis not present

## 2013-07-27 DIAGNOSIS — J309 Allergic rhinitis, unspecified: Secondary | ICD-10-CM | POA: Diagnosis not present

## 2013-07-29 ENCOUNTER — Ambulatory Visit (INDEPENDENT_AMBULATORY_CARE_PROVIDER_SITE_OTHER): Payer: Medicare Other | Admitting: Pharmacist Clinician (PhC)/ Clinical Pharmacy Specialist

## 2013-07-29 DIAGNOSIS — I4891 Unspecified atrial fibrillation: Secondary | ICD-10-CM | POA: Diagnosis not present

## 2013-07-29 DIAGNOSIS — I480 Paroxysmal atrial fibrillation: Secondary | ICD-10-CM

## 2013-07-29 DIAGNOSIS — Z7901 Long term (current) use of anticoagulants: Secondary | ICD-10-CM

## 2013-07-29 LAB — POCT INR: INR: 2.7

## 2013-07-30 ENCOUNTER — Telehealth: Payer: Self-pay | Admitting: Internal Medicine

## 2013-07-30 NOTE — Telephone Encounter (Signed)
Pt tells me that his A Fib has been acting up terribly since stopping medications last time he was in office to see Dr. [name].  He states it has been on and off but it lasted all night last night. I scheduled him to see [name] 7/9 to discuss options for control/management. Pt is agreeable to this plan.

## 2013-07-30 NOTE — Telephone Encounter (Signed)
New message          Pt wants to see dr [name] asap / pt states he is having afib issues / offered pt to see kelly in July for his f/u pt declined

## 2013-08-04 DIAGNOSIS — J309 Allergic rhinitis, unspecified: Secondary | ICD-10-CM | POA: Diagnosis not present

## 2013-08-06 ENCOUNTER — Encounter: Payer: Self-pay | Admitting: Internal Medicine

## 2013-08-06 ENCOUNTER — Other Ambulatory Visit: Payer: Self-pay | Admitting: *Deleted

## 2013-08-06 ENCOUNTER — Ambulatory Visit (INDEPENDENT_AMBULATORY_CARE_PROVIDER_SITE_OTHER): Payer: Medicare Other | Admitting: Internal Medicine

## 2013-08-06 VITALS — BP 129/69 | HR 62 | Ht 67.0 in | Wt 156.0 lb

## 2013-08-06 DIAGNOSIS — Z95810 Presence of automatic (implantable) cardiac defibrillator: Secondary | ICD-10-CM | POA: Diagnosis not present

## 2013-08-06 DIAGNOSIS — I480 Paroxysmal atrial fibrillation: Secondary | ICD-10-CM

## 2013-08-06 DIAGNOSIS — I5022 Chronic systolic (congestive) heart failure: Secondary | ICD-10-CM

## 2013-08-06 DIAGNOSIS — I255 Ischemic cardiomyopathy: Secondary | ICD-10-CM

## 2013-08-06 DIAGNOSIS — I2589 Other forms of chronic ischemic heart disease: Secondary | ICD-10-CM

## 2013-08-06 DIAGNOSIS — I4891 Unspecified atrial fibrillation: Secondary | ICD-10-CM | POA: Diagnosis not present

## 2013-08-06 LAB — MDC_IDC_ENUM_SESS_TYPE_INCLINIC
Battery Voltage: 3.04 V
Brady Statistic RV Percent Paced: 0.06 %
Date Time Interrogation Session: 20150709141954
HighPow Impedance: 54 Ohm
HighPow Impedance: 84 Ohm
Lead Channel Pacing Threshold Amplitude: 1 V
Lead Channel Pacing Threshold Pulse Width: 0.4 ms
Lead Channel Setting Pacing Pulse Width: 0.4 ms
MDC IDC MSMT LEADCHNL RV IMPEDANCE VALUE: 1168 Ohm
MDC IDC MSMT LEADCHNL RV SENSING INTR AMPL: 19.552 mV
MDC IDC SET LEADCHNL RV PACING AMPLITUDE: 2.5 V
MDC IDC SET LEADCHNL RV SENSING SENSITIVITY: 0.3 mV
MDC IDC SET ZONE DETECTION INTERVAL: 300 ms
MDC IDC SET ZONE DETECTION INTERVAL: 450 ms
Zone Setting Detection Interval: 240 ms
Zone Setting Detection Interval: 340 ms

## 2013-08-06 MED ORDER — WARFARIN SODIUM 5 MG PO TABS: ORAL_TABLET | ORAL | Status: DC

## 2013-08-06 NOTE — Patient Instructions (Signed)
Your physician recommends that you continue on your current medications as directed. Please refer to the Current Medication list given to you today.  Please call us when you are ready to schedule dofetilide loading.

## 2013-08-06 NOTE — Progress Notes (Signed)
Patient Care Team: [name], MD as PCP - General (Family Medicine)   HPI  [name] is a 73 y.o. 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 He has a history of bypass surgery.  His last nuclear perfusion study was in November 2013 which showed a large area of scar in the LAD territory (extent 44%) involving the mid to apical anterior, apical and infero-apical to mid infero-septal and apical lateral wall without associated ischemia. His last echo Doppler study was done on 01/01/2013. His left ventricle was mildly dilated. Ejection fraction was 25-30%. There was dyskinesis in scarring of the apical myocardium as well as akinesis of the mid anteroseptal, anterior and anterolateral walls consistent with his prior LAD infarction.    He has a history of paroxysmal atrial fibrillation and is on warfarin  This has been increasingly problematic and prompted Dr. Angelique Holm to put him on amiodarone. He has had progressive problems with bradycardia associated with increasing sluggishness.   He has had no intercurrent discharges. there has been no shortness of breath. There has been no peripheral edema. and no chest pain.  He has worseing of peripheral vision dating back to cabg He is scheduled neuroopthomolagist   Past Medical History  Diagnosis Date  . Coronary artery disease     Hx MI 1992, CABG 1998 , Nuc study 11.2013 large scar but no ischemia  . 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 Atrial fib/flutter, transient   . GERD (gastroesophageal reflux disease)   . H. pylori infection     Hx of   . Cardiomyopathy, ischemic 2011    with EF 25-35% by echo  . H/O myocardial infarction, greater than 8 weeks 1992    large ant wall injury  . NSVT (nonsustained ventricular tachycardia)     Past Surgical History  Procedure Laterality Date  . Nasal sinus surgery  05/31/2010    Dr. Benjamine Mola   . Coronary artery bypass graft  1998    x 5  . Cataract extraction, bilateral    . Knee surgery      right  . Hernia repair    . Icd generator change  01/2008    medtronic, hx+ EP study    Current Outpatient Prescriptions  Medication Sig Dispense Refill  . acetaminophen (TYLENOL) 650 MG CR tablet Take 650 mg by mouth 2 (two) times daily.       Marland Kitchen allopurinol (ZYLOPRIM) 300 MG tablet Take 150 mg by mouth daily.       . carvedilol (COREG) 6.25 MG tablet Take 6.25 mg by mouth 2 (two) times daily.      . cetirizine (ZYRTEC) 10 MG tablet Take 10 mg by mouth daily.        Marland Kitchen dicyclomine (BENTYL) 10 MG capsule Take 1 capsule (10 mg total) by mouth 2 (two) times daily as needed.  90 capsule  2  . esomeprazole (NEXIUM) 40 MG capsule Take 40 mg by mouth daily.      . fish oil-omega-3 fatty acids 1000 MG capsule Take 1 g by mouth daily.      . isosorbide mononitrate (IMDUR) 30 MG 24 hr tablet 1/2 tablet daily.      Marland Kitchen lisinopril (PRINIVIL) 20 MG tablet Take 0.5 tablets (10 mg total) by mouth 2 (two) times daily.  90 tablet  3  . mometasone (ASMANEX) 220 MCG/INH inhaler Inhale  1 puff into the lungs daily.       . mometasone (NASONEX) 50 MCG/ACT nasal spray Place 2 sprays into the nose daily.      . montelukast (SINGULAIR) 10 MG tablet Take 1 tablet (10 mg total) by mouth at bedtime.  90 tablet  3  . nitroGLYCERIN (NITROSTAT) 0.4 MG SL tablet Place 0.4 mg under the tongue every 5 (five) minutes as needed for chest pain.      . silodosin (RAPAFLO) 4 MG CAPS capsule Take 8 mg by mouth. Every 2 days      . simvastatin (ZOCOR) 40 MG tablet Take 1 tablet (40 mg total) by mouth at bedtime.  90 tablet  3  . spironolactone (ALDACTONE) 25 MG tablet Take 0.5 tablets (12.5 mg total) by mouth daily.  90 tablet  3  . vitamin B-12 (CYANOCOBALAMIN) 50 MCG tablet Take 50 mcg by mouth daily.      Marland Kitchen warfarin (COUMADIN) 5 MG tablet Take 2.5-5 mg by mouth daily. Takes 5mg  on Sunday, Monday, Wednesday, and Friday. Takes  2.5mg  on Tuesday, Thursday, and Saturday       No current facility-administered medications for this visit.    Allergies  Allergen Reactions  . Amoxicillin     REACTION: unspecified  . Penicillins     REACTION: unspecified    Review of Systems negative except from HPI and PMH  Physical Exam BP 129/69  Pulse 62  Ht 5\' 7"  (1.702 m)  Wt 156 lb (70.761 kg)  BMI 24.43 kg/m2 Well developed and well nourished in no acute distress HENT normal E scleral and icterus clear Neck Supple JVP flat; carotids brisk and full Clear to ausculation Slwo but Regular rate and rhythm, no murmurs gallops or rub Soft with active bowel sounds No clubbing cyanosis none Edema Alert and oriented, grossly normal motor and sensory function Skin Warm and Dry  ECG demonstrates sinus rhythm at 49 intervals 18/13/50 with a QTC of 453 Axis left -70  Assessment and  Plan  Atrial fibrillation-paroxysmal  Sinus bradycardia  Ischemic cardiac myopathy  Congestive heart failure-chronic-systolic  Defibrillator-Medtronic-VVI  [name] is struggling with paroxysms of atrial fibrillation which are quite disruptive despite what apparently is not all that faster rate. Dr. Leda Gauze had begun him on amiodarone which  Prompted concerns regarding bradycardia. It had been our recommendation that we stop his carvedilol and his digoxin as he is having symptomatic heart rates in the 40s.  The amiodarone level was checked. There was a miscommunication and he stopped the amiodarone.  He comes in today to discuss next that's. These would include the use of amiodarone and probably less carvedilol; alternatively we could use dofetilide and perhaps more carvedilol.  He would like to pursue the latter option. We have discussed proarrhythmic potential need for in-hospital initiation  He is going to call us when he elects that dates to be admitted to hospital for dofetilide initiation

## 2013-08-06 NOTE — Telephone Encounter (Signed)
Rx was sent to pharmacy electronically. 

## 2013-08-10 ENCOUNTER — Telehealth: Payer: Self-pay | Admitting: Internal Medicine

## 2013-08-10 DIAGNOSIS — J309 Allergic rhinitis, unspecified: Secondary | ICD-10-CM | POA: Diagnosis not present

## 2013-08-10 NOTE — Telephone Encounter (Signed)
New problem    Pt would like to speak to you concerning a test the doctor talked to him about. Please call pt today

## 2013-08-10 NOTE — Telephone Encounter (Signed)
Patient ready to schedule dofetilide loading.  Informed him that I would send this to our pharmacist to arrange. Pt is requesting 1st week of August. Pt is agreeable to plan.

## 2013-08-11 DIAGNOSIS — E559 Vitamin D deficiency, unspecified: Secondary | ICD-10-CM | POA: Diagnosis not present

## 2013-08-11 DIAGNOSIS — F411 Generalized anxiety disorder: Secondary | ICD-10-CM | POA: Diagnosis not present

## 2013-08-11 DIAGNOSIS — E538 Deficiency of other specified B group vitamins: Secondary | ICD-10-CM | POA: Diagnosis not present

## 2013-08-11 DIAGNOSIS — M2550 Pain in unspecified joint: Secondary | ICD-10-CM | POA: Diagnosis not present

## 2013-08-11 DIAGNOSIS — I4891 Unspecified atrial fibrillation: Secondary | ICD-10-CM | POA: Diagnosis not present

## 2013-08-13 ENCOUNTER — Telehealth: Payer: Self-pay | Admitting: Cardiovascular Disease

## 2013-08-13 ENCOUNTER — Encounter: Payer: Self-pay | Admitting: Internal Medicine

## 2013-08-13 NOTE — Telephone Encounter (Signed)
Close encounter 

## 2013-08-13 NOTE — Telephone Encounter (Signed)
Returned call to patient. He saw Dr. [name] on 7/9 and it was mentioned to him about starting on Tikosyn. Patient is concerned over costs and wants to get Dr. Evette Georges opinion - or see if he should try amiodarone again. He would like to get Dr. Evette Georges opinion quickly, as if he recommends starting on Tikosyn he will need to contact Dr. Olin Pia office to set up the admission for starting the med.   Will defer to [name]

## 2013-08-13 NOTE — Telephone Encounter (Signed)
Notified patient of advice per [name]. Voiced understanding. He will decide on tikosyn and contact Dr. Olin Pia office accordingly.

## 2013-08-13 NOTE — Telephone Encounter (Signed)
I have reviewed Dr. Olin Pia note from 7/9. Options include tikosyn with need for admission to make certain no pro arrythmia vs re try amiodarone with reduced beta blocker since he developed significant bradycardia on amio in the past. I am ok with either option. Pt to discuss with dr SK to decide.

## 2013-08-13 NOTE — Telephone Encounter (Signed)
Pt said he wanted to talk to somebody a change in a new medication.Pt was asked for the name of the medication.

## 2013-08-19 DIAGNOSIS — J309 Allergic rhinitis, unspecified: Secondary | ICD-10-CM | POA: Diagnosis not present

## 2013-08-24 ENCOUNTER — Telehealth: Payer: Self-pay | Admitting: Pharmacist

## 2013-08-24 ENCOUNTER — Encounter: Payer: Self-pay | Admitting: *Deleted

## 2013-08-24 DIAGNOSIS — J309 Allergic rhinitis, unspecified: Secondary | ICD-10-CM | POA: Diagnosis not present

## 2013-08-24 NOTE — Telephone Encounter (Signed)
Pt called regarding his upcoming Tikosyn admission.  He states he increased his Coreg and feels much better.  He would like to hold off on starting Tikosyn at this time.  He is aware to call us if he starts to feel bad again.

## 2013-08-26 ENCOUNTER — Ambulatory Visit (INDEPENDENT_AMBULATORY_CARE_PROVIDER_SITE_OTHER): Payer: Medicare Other | Admitting: Pharmacist Clinician (PhC)/ Clinical Pharmacy Specialist

## 2013-08-26 VITALS — BP 120/76 | HR 56

## 2013-08-26 DIAGNOSIS — I4891 Unspecified atrial fibrillation: Secondary | ICD-10-CM | POA: Diagnosis not present

## 2013-08-26 DIAGNOSIS — I480 Paroxysmal atrial fibrillation: Secondary | ICD-10-CM

## 2013-08-26 DIAGNOSIS — Z7901 Long term (current) use of anticoagulants: Secondary | ICD-10-CM

## 2013-08-26 LAB — POCT INR: INR: 2.4

## 2013-09-01 ENCOUNTER — Ambulatory Visit: Payer: Medicare Other | Admitting: Pharmacist

## 2013-09-04 DIAGNOSIS — J309 Allergic rhinitis, unspecified: Secondary | ICD-10-CM | POA: Diagnosis not present

## 2013-09-07 DIAGNOSIS — J309 Allergic rhinitis, unspecified: Secondary | ICD-10-CM | POA: Diagnosis not present

## 2013-09-08 DIAGNOSIS — M109 Gout, unspecified: Secondary | ICD-10-CM | POA: Diagnosis not present

## 2013-09-08 DIAGNOSIS — M25519 Pain in unspecified shoulder: Secondary | ICD-10-CM | POA: Diagnosis not present

## 2013-09-09 DIAGNOSIS — H5340 Unspecified visual field defects: Secondary | ICD-10-CM | POA: Diagnosis not present

## 2013-09-09 DIAGNOSIS — Z961 Presence of intraocular lens: Secondary | ICD-10-CM | POA: Diagnosis not present

## 2013-09-14 ENCOUNTER — Ambulatory Visit (INDEPENDENT_AMBULATORY_CARE_PROVIDER_SITE_OTHER): Payer: Medicare Other | Admitting: *Deleted

## 2013-09-14 ENCOUNTER — Encounter: Payer: Self-pay | Admitting: *Deleted

## 2013-09-14 ENCOUNTER — Telehealth: Payer: Self-pay | Admitting: Internal Medicine

## 2013-09-14 DIAGNOSIS — I5022 Chronic systolic (congestive) heart failure: Secondary | ICD-10-CM | POA: Diagnosis not present

## 2013-09-14 DIAGNOSIS — Z95810 Presence of automatic (implantable) cardiac defibrillator: Secondary | ICD-10-CM

## 2013-09-14 NOTE — Telephone Encounter (Signed)
New message     Please call pt on his cell phone before you leave today

## 2013-09-14 NOTE — Progress Notes (Signed)
EPIC Encounter for ICM Monitoring  Patient Name: ABDULAH [name] is a 73 y.o. male Date: 09/14/2013 Primary Care Physican: [name], MD Primary Cardiologist: [name] Electrophysiologist: [name] Dry Weight: 157 lbs       In the past month, have you:  1. Gained more than 2 pounds in a day or more than 5 pounds in a week? no  2. Had changes in your medications (with verification of current medications)? Yes. The patient saw Dr. [name] on 08/06/13. There was a discussion about starting Tikosyn for his a-fib. He was going to think about this.The patient states that Dr. [name] told him he could take an extra coreg if needed. He has been taking coreg 6.25 mg TID and this has been holding his a-fib symptoms at bay.  3. Had more shortness of breath than is usual for you? no  4. Limited your activity because of shortness of breath? no  5. Not been able to sleep because of shortness of breath? no  6. Had increased swelling in your feet or ankles? no  7. Had symptoms of dehydration (dizziness, dry mouth, increased thirst, decreased urine output) no  8. Had changes in sodium restriction? no  9. Been compliant with medication? Yes   ICM trend:   Follow-up plan: ICM clinic phone appointment: 10/15/13. The patient's optivol readings have been trending up since ~7/13. As of today, his impedence looks to be trending back to baseline. The patient is asymptomatic and weights are stable. No changes made today. The patient and I had a discussion about signs and symptoms of possible CHF to look for. He is on a low sodium diet. He will call us should he develop symptoms. I advised I would call back should [name] or Dr. [name] feel his diuretics need to be adjusted short term. He is agreeable.  Copy of note sent to patient's primary care physician, primary cardiologist, and device following physician.  Alvis Lemmings, RN, BSN 09/14/2013 6:26 PM

## 2013-09-14 NOTE — Telephone Encounter (Signed)
I spoke with the patient. 

## 2013-09-16 DIAGNOSIS — J309 Allergic rhinitis, unspecified: Secondary | ICD-10-CM | POA: Diagnosis not present

## 2013-09-21 DIAGNOSIS — J309 Allergic rhinitis, unspecified: Secondary | ICD-10-CM | POA: Diagnosis not present

## 2013-09-29 DIAGNOSIS — H353190 Nonexudative age-related macular degeneration, unspecified eye, stage unspecified: Secondary | ICD-10-CM | POA: Diagnosis not present

## 2013-09-29 DIAGNOSIS — H43819 Vitreous degeneration, unspecified eye: Secondary | ICD-10-CM | POA: Diagnosis not present

## 2013-09-29 DIAGNOSIS — H47019 Ischemic optic neuropathy, unspecified eye: Secondary | ICD-10-CM | POA: Diagnosis not present

## 2013-10-02 DIAGNOSIS — J309 Allergic rhinitis, unspecified: Secondary | ICD-10-CM | POA: Diagnosis not present

## 2013-10-06 ENCOUNTER — Encounter: Payer: Self-pay | Admitting: Cardiovascular Disease

## 2013-10-06 ENCOUNTER — Ambulatory Visit (INDEPENDENT_AMBULATORY_CARE_PROVIDER_SITE_OTHER): Payer: Medicare Other | Admitting: *Deleted

## 2013-10-06 ENCOUNTER — Ambulatory Visit (INDEPENDENT_AMBULATORY_CARE_PROVIDER_SITE_OTHER): Payer: Medicare Other | Admitting: Cardiovascular Disease

## 2013-10-06 VITALS — BP 122/74 | HR 56 | Ht 67.0 in | Wt 157.7 lb

## 2013-10-06 DIAGNOSIS — I4949 Other premature depolarization: Secondary | ICD-10-CM

## 2013-10-06 DIAGNOSIS — I2589 Other forms of chronic ischemic heart disease: Secondary | ICD-10-CM

## 2013-10-06 DIAGNOSIS — J45909 Unspecified asthma, uncomplicated: Secondary | ICD-10-CM

## 2013-10-06 DIAGNOSIS — K219 Gastro-esophageal reflux disease without esophagitis: Secondary | ICD-10-CM

## 2013-10-06 DIAGNOSIS — Z7901 Long term (current) use of anticoagulants: Secondary | ICD-10-CM | POA: Diagnosis not present

## 2013-10-06 DIAGNOSIS — R002 Palpitations: Secondary | ICD-10-CM

## 2013-10-06 DIAGNOSIS — I480 Paroxysmal atrial fibrillation: Secondary | ICD-10-CM

## 2013-10-06 DIAGNOSIS — E785 Hyperlipidemia, unspecified: Secondary | ICD-10-CM

## 2013-10-06 DIAGNOSIS — I4891 Unspecified atrial fibrillation: Secondary | ICD-10-CM | POA: Diagnosis not present

## 2013-10-06 DIAGNOSIS — I493 Ventricular premature depolarization: Secondary | ICD-10-CM

## 2013-10-06 LAB — POCT INR: INR: 1.8

## 2013-10-06 MED ORDER — CARVEDILOL 6.25 MG PO TABS: 6.2500 mg | ORAL_TABLET | Freq: Three times a day (TID) | ORAL | Status: DC

## 2013-10-06 NOTE — Progress Notes (Signed)
Patient ID: [name], male   DOB: [birth year], 73 y.o.   MRN: [id number]      HPI: [name] is a 73 y.o. male who presents to the office for a 4 month cardiology follow up evaluation.   [name] has a history of an ischemic cardiomyopathy secondary to suffering a large anterior wall myocardial infarction in 1992. In September 1998 he underwent CABG revascularization surgery after an unsuccessful attempt at stenting of his proximal LAD by [name]. Ejection fraction was 25-30%. In 2002 he underwent initial ICD implantation for nonsustained ventricular tachycardia documented on event monitor for primary prevention. In January 2010 he underwent generator change with a  Medtronic Virtuoso single chamber cardioverter defibrillator. Additional problems include paroxysmal atrial fibrillation, occasional PVCs and an attempt is made in the past 2 overdrive suppress his PVCs with reducing his beta blocker therapy patient felt he had PVCs on the reduced dose and therefore has been on the higher dose. Due to bradycardia, his  Lanoxin dose was also reduced.  A nuclear perfusion study was in November 2013 which showed a large area of scar in the LAD territory (extent 44%) involving the mid to apical anterior,  apical and infero-apical to mid infero-septal and apical lateral wall without associated ischemia.  When I saw [name] in June 2014 he was complaining of mild tenderness in the breast region. I felt this possibly could be related to his Spironolactone and consequently recommended a trial of Inspra 25 mg to see if this could improve his symptoms. He was seen in the emergency room for presyncope in July and was concerned that his heart was beating erratically. In the emergency room his ICD was interrogated and no arrhythmias were noted. His vital signs were stable. He had  normal troponin. Patient preferred discontinuing the Inspra particularly due to cost and resumed  spironolactone again. He feels  that he seems to tolerate this better.   His last echo Doppler study was done on 01/01/2013. His left ventricle was mildly dilated. Ejection fraction was 25-30%. There was dyskinesis in scarring of the apical myocardium as well as akinesis of the mid anteroseptal, anterior and anterolateral walls consistent with his prior LAD infarction. He does have a history of paroxysmal atrial fibrillation. Recently, his view he feels that his heart rhythm is stable. He presents for evaluation.  When I last saw [name] in he was doing well without CHF symptoms and was on low-dose amiodarone, which had been resumed at 200 mg daily with reduction of his carvedilol dose.  Apparently, he had some confusion with his medications.  He also has seen Dr. [name] since his office visit.  Apparently, the patient stopped the amiodarone.  He now is taking carvedilol 6.25 mg 3 times a day.  He is unaware of recurrent atrial fibrillation.  He does note occasional palpitations.  He denies associated presyncope or syncope.  He also has a history of type tension for which he now is been taking the lisinopril 10 mg twice a day.  In addition to his carvedilol.  He has tolerated low-dose Aldactone at 12.5 mg daily.  He also has a history of mild asthma for which he takes Singulair 10 mg in addition to Asmanex inhaler.  He has a history of hyperlipidemia and is tolerating Zocor 40 mg.  There is a remote history of gout, which he has been on allopurinol at low dose 150 mg daily without recurrence.  He is on Coumadin anticoagulation  and denies bleeding is unaware of anginal symptoms.  He presents for evaluation  Past Medical History  Diagnosis Date  . Coronary artery disease     Hx MI 1992, CABG 1998 , Nuc study 11.2013 large scar but no ischemia  . 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 Atrial fib/flutter, transient   . GERD (gastroesophageal reflux disease)   . H. pylori infection      Hx of   . Cardiomyopathy, ischemic 2011    with EF 25-35% by echo  . H/O myocardial infarction, greater than 8 weeks 1992    large ant wall injury  . NSVT (nonsustained ventricular tachycardia)     Past Surgical History  Procedure Laterality Date  . Nasal sinus surgery  05/31/2010    Dr. Benjamine Mola  . Coronary artery bypass graft  1998    x 5  . Cataract extraction, bilateral    . Knee surgery      right  . Hernia repair    . Icd generator change  01/2008    medtronic, hx+ EP study    Allergies  Allergen Reactions  . Amoxicillin     REACTION: unspecified  . Penicillins     REACTION: unspecified    Current Outpatient Prescriptions  Medication Sig Dispense Refill  . acetaminophen (TYLENOL) 650 MG CR tablet Take 650 mg by mouth 2 (two) times daily.       Marland Kitchen allopurinol (ZYLOPRIM) 300 MG tablet Take 150 mg by mouth daily.       . carvedilol (COREG) 6.25 MG tablet Take 6.25 mg by mouth 3 (three) times daily.       . cetirizine (ZYRTEC) 10 MG tablet Take 10 mg by mouth daily.        Marland Kitchen dicyclomine (BENTYL) 10 MG capsule Take 1 capsule (10 mg total) by mouth 2 (two) times daily as needed.  90 capsule  2  . esomeprazole (NEXIUM) 40 MG capsule Take 40 mg by mouth daily.      . fish oil-omega-3 fatty acids 1000 MG capsule Take 1 g by mouth daily.      . isosorbide mononitrate (IMDUR) 30 MG 24 hr tablet 1/2 tablet daily.      Marland Kitchen lisinopril (PRINIVIL) 20 MG tablet Take 0.5 tablets (10 mg total) by mouth 2 (two) times daily.  90 tablet  3  . mometasone (ASMANEX) 220 MCG/INH inhaler Inhale 1 puff into the lungs daily.       . mometasone (NASONEX) 50 MCG/ACT nasal spray Place 2 sprays into the nose daily.      . montelukast (SINGULAIR) 10 MG tablet Take 1 tablet (10 mg total) by mouth at bedtime.  90 tablet  3  . nitroGLYCERIN (NITROSTAT) 0.4 MG SL tablet Place 0.4 mg under the tongue every 5 (five) minutes as needed for chest pain.      . silodosin (RAPAFLO) 4 MG CAPS capsule Take 8 mg by mouth.  Every 2 days      . simvastatin (ZOCOR) 40 MG tablet Take 1 tablet (40 mg total) by mouth at bedtime.  90 tablet  3  . spironolactone (ALDACTONE) 25 MG tablet Take 0.5 tablets (12.5 mg total) by mouth daily.  90 tablet  3  . warfarin (COUMADIN) 5 MG tablet Take as directed by mouth per INR. 5mg  on Sunday, Monday, Wednesday, and Friday. Takes 2.5mg  on Tuesday, Thursday, and Saturday  90 tablet  0   No current  facility-administered medications for this visit.    Socially he is married and has 3 children and 5 grandchildren. He does exercise most recently with walking. He used to play tennis. There is no tobacco or alcohol use.  ROS General: Negative; No fevers, chills, or night sweats;  HEENT: Negative; No changes in vision or hearing, sinus congestion, difficulty swallowing Pulmonary: 2 mild asthma; No cough, wheezing, shortness of breath, hemoptysis Cardiovascular: Negative; No chest pain, presyncope, syncope, palpitations GI: History of GERD No nausea, vomiting, diarrhea, or abdominal pain GU: Negative; No dysuria, hematuria, or difficulty voiding Musculoskeletal: Negative; no myalgias, joint pain, or weakness Hematologic/Oncology: Negative; no easy bruising, bleeding Endocrine: Negative; no heat/cold intolerance; no diabetes Neuro: Negative; no changes in balance, headaches Skin: Negative; No rashes or skin lesions Psychiatric: Negative; No behavioral problems, depression Sleep: Negative; No snoring, daytime sleepiness, hypersomnolence, bruxism, restless legs, hypnogognic hallucinations, no cataplexy Other comprehensive 14 point system review is negative.   PE BP 122/74  Pulse 56  Ht 5\' 7"  (1.702 m)  Wt 157 lb 11.2 oz (71.532 kg)  BMI 24.69 kg/m2 repeat supine blood pressure 120/70 and standing blood pressure 110/70, without orthostatic change. General: Alert, oriented, no distress.  Skin: normal turgor, no rashes HEENT: Normocephalic, atraumatic. Pupils round and reactive;  sclera anicteric;no lid lag,  Nose without nasal septal hypertrophy Mouth/Parynx benign; Mallinpatti scale 2 Neck: No JVD, no carotid bruits; normal carotid upstroke Chest wall: No tenderness to palpation Lungs: clear to ausculatation and percussion; no wheezing or rales Heart: RRR, s1 s2 normal 8-5/0 systolic murmur, unchanged.  No diastolic murmur No S3 gallop. No heave or parasternal lift. No rub. Abdomen: soft, nontender; no hepatosplenomehaly, BS+; abdominal aorta nontender and not dilated by palpation. Back: No CVA tenderness Pulses 2+ Extremities: no clubbing cyanosis or edema, Homan's sign negative  Neurologic: grossly nonfocal; cranial nerves normal Psychological: Normal affect and mood.  ECG (independently read by me): Sinus rhythm at 56 beats per minute with occasional isolated PVC.  06/01/2013 ECG (independently read by me): Sinus bradycardia at 49 beats per minute.  No ectopy.  QTc interval 45 ms.  Prior ECG (independently read by me): Sinus bradycardia 52 beats per minute. Old anterior lateral myocardial infarction. Nonspecific interventricular conduction delay. Previously noted T-wave changes.  ECG from 08/28/2012: Sinus bradycardia 52 beats per minute. No ectopy. Diffuse T wave abnormality the 4 through V6 1 and L. Poor R-wave progression compatible with prior anterior wall MI.  LABS:  BMET    Component Value Date/Time   NA 137 06/01/2013 0813   K 4.0 06/01/2013 0813   CL 103 06/01/2013 0813   CO2 25 06/01/2013 0813   GLUCOSE 93 06/01/2013 0813   BUN 9 06/01/2013 0813   CREATININE 0.91 06/01/2013 0813   CREATININE 0.75 04/21/2013 1426   CALCIUM 9.1 06/01/2013 0813   GFRNONAA 89* 04/21/2013 1426   GFRAA >90 04/21/2013 1426     Hepatic Function Panel     Component Value Date/Time   PROT 6.4 06/01/2013 0813   ALBUMIN 4.0 06/01/2013 0813   AST 17 06/01/2013 0813   ALT 14 06/01/2013 0813   ALKPHOS 45 06/01/2013 0813   BILITOT 0.6 06/01/2013 0813   BILIDIR 0.2 11/07/2012 1509      CBC    Component Value Date/Time   WBC 7.7 06/01/2013 0813   RBC 5.12 06/01/2013 0813   HGB 14.3 06/01/2013 0813   HCT 42.7 06/01/2013 0813   PLT 303 06/01/2013 0813   MCV 83.4 06/01/2013  0813   MCH 27.9 06/01/2013 0813   MCHC 33.5 06/01/2013 0813   RDW 14.8 06/01/2013 0813   LYMPHSABS 1.6 11/07/2012 1509   MONOABS 0.9 11/07/2012 1509   EOSABS 0.3 11/07/2012 1509   BASOSABS 0.0 11/07/2012 1509     BNP No results found for this basename: probnp    Lipid Panel     Component Value Date/Time   CHOL 111 06/01/2013 0813     RADIOLOGY: No results found.    ASSESSMENT AND PLAN:  Mr. Modena Slater suffered a large anterior wall myocardial infarction and has ischemic cardiomyopathy with an ejection fraction in the 25-35% range. His last nuclear perfusion study in November 2013 was nonischemic. He is doing well without CHF symptoms.  He has had issues in the past with paroxysmal atrial fibrillation.  This did improve with resumption of amiodarone.  However, since I last saw him he no longer is taking amiodarone.  He has been taking carvedilol now at 6.25 mg 3 times a day and he feels that he tolerates this better.  He has less fatigue.  His pulse now is in the mid 50s rather than bradycardic.  He does note an isolated extra beat, which on ECG is a PVC.  His blood pressure today is controlled on lisinopril 10 mg twice a day.  In addition to his spirometer and 12.5 mg daily, and carvedilol.  He's not having anginal symptoms and continues to take isosorbide mononitrate at 15 milligrams.  His asthma is controlled, and he's does not have wheezing.  He does not have any gouty symptoms on allopurinol.  He's not having any bleeding problems.  He tells me also as developed vitamin D insufficiency and in addition is taking vitamin D 4000 units daily.  Followup blood work will need to be done in the future hernia.  Just been started on his treatment.  I have renewed his prescriptions.  I recommended at present he continue  his current regimen, but if necessary, further titration of carvedilol may be needed if he does continue to have significant ectopy.  He has not had any ICD discharge.  I will see him in 6 months.   Troy Sine, MD, [facility]  10/06/2013 9:04 AM

## 2013-10-06 NOTE — Patient Instructions (Signed)
Your physician wants you to follow-up in: 6 months. You will receive a reminder letter in the mail two months in advance. If you don't receive a letter, please call our office to schedule the follow-up appointment. No changes were made today in your therapy.  

## 2013-10-07 ENCOUNTER — Other Ambulatory Visit: Payer: Self-pay | Admitting: Cardiology

## 2013-10-07 DIAGNOSIS — J309 Allergic rhinitis, unspecified: Secondary | ICD-10-CM | POA: Diagnosis not present

## 2013-10-07 NOTE — Telephone Encounter (Signed)
Rx was sent to pharmacy electronically. 

## 2013-10-12 DIAGNOSIS — J309 Allergic rhinitis, unspecified: Secondary | ICD-10-CM | POA: Diagnosis not present

## 2013-10-15 ENCOUNTER — Ambulatory Visit (INDEPENDENT_AMBULATORY_CARE_PROVIDER_SITE_OTHER): Payer: Medicare Other | Admitting: *Deleted

## 2013-10-15 ENCOUNTER — Encounter: Payer: Self-pay | Admitting: *Deleted

## 2013-10-15 DIAGNOSIS — Z95810 Presence of automatic (implantable) cardiac defibrillator: Secondary | ICD-10-CM

## 2013-10-15 DIAGNOSIS — I5022 Chronic systolic (congestive) heart failure: Secondary | ICD-10-CM

## 2013-10-15 NOTE — Progress Notes (Signed)
EPIC Encounter for ICM Monitoring  Patient Name: [name] is a 73 y.o. male Date: 10/15/2013 Primary Care Physican: [name], MD Primary Cardiologist: [name] Electrophysiologist: [name] Dry Weight: 157 lbs       In the past month, have you:  1. Gained more than 2 pounds in a day or more than 5 pounds in a week? no  2. Had changes in your medications (with verification of current medications)? no  3. Had more shortness of breath than is usual for you? no  4. Limited your activity because of shortness of breath? no  5. Not been able to sleep because of shortness of breath? no  6. Had increased swelling in your feet or ankles? no  7. Had symptoms of dehydration (dizziness, dry mouth, increased thirst, decreased urine output) no  8. Had changes in sodium restriction? no  9. Been compliant with medication? Yes   ICM trend:   Follow-up plan: ICM clinic phone appointment: 11/16/13  Copy of note sent to patient's primary care physician, primary cardiologist, and device following physician.  Alvis Lemmings, RN, BSN 10/15/2013 2:29 PM

## 2013-10-19 DIAGNOSIS — J309 Allergic rhinitis, unspecified: Secondary | ICD-10-CM | POA: Diagnosis not present

## 2013-10-21 ENCOUNTER — Telehealth: Payer: Self-pay | Admitting: Pharmacist Clinician (PhC)/ Clinical Pharmacy Specialist

## 2013-10-21 DIAGNOSIS — J309 Allergic rhinitis, unspecified: Secondary | ICD-10-CM | POA: Diagnosis not present

## 2013-10-21 NOTE — Telephone Encounter (Signed)
Pt called, LMOM asking if okay to take vitamin for vision - Preservision   Returned call, LMOM letting pt know that Preservision is fine with current meds/warfarin

## 2013-10-28 ENCOUNTER — Ambulatory Visit (INDEPENDENT_AMBULATORY_CARE_PROVIDER_SITE_OTHER): Payer: Medicare Other | Admitting: Pharmacist Clinician (PhC)/ Clinical Pharmacy Specialist

## 2013-10-28 VITALS — BP 122/80 | HR 64

## 2013-10-28 DIAGNOSIS — Z7901 Long term (current) use of anticoagulants: Secondary | ICD-10-CM

## 2013-10-28 DIAGNOSIS — I4891 Unspecified atrial fibrillation: Secondary | ICD-10-CM | POA: Diagnosis not present

## 2013-10-28 DIAGNOSIS — I480 Paroxysmal atrial fibrillation: Secondary | ICD-10-CM

## 2013-10-28 LAB — POCT INR: INR: 2.2

## 2013-10-29 DIAGNOSIS — J3089 Other allergic rhinitis: Secondary | ICD-10-CM | POA: Diagnosis not present

## 2013-11-02 DIAGNOSIS — J3089 Other allergic rhinitis: Secondary | ICD-10-CM | POA: Diagnosis not present

## 2013-11-03 DIAGNOSIS — Z23 Encounter for immunization: Secondary | ICD-10-CM | POA: Diagnosis not present

## 2013-11-09 DIAGNOSIS — J3089 Other allergic rhinitis: Secondary | ICD-10-CM | POA: Diagnosis not present

## 2013-11-16 ENCOUNTER — Ambulatory Visit (INDEPENDENT_AMBULATORY_CARE_PROVIDER_SITE_OTHER): Payer: Medicare Other | Admitting: *Deleted

## 2013-11-16 ENCOUNTER — Encounter: Payer: Self-pay | Admitting: Internal Medicine

## 2013-11-16 ENCOUNTER — Telehealth: Payer: Self-pay | Admitting: Cardiology

## 2013-11-16 DIAGNOSIS — I255 Ischemic cardiomyopathy: Secondary | ICD-10-CM

## 2013-11-16 DIAGNOSIS — Z95810 Presence of automatic (implantable) cardiac defibrillator: Secondary | ICD-10-CM

## 2013-11-16 DIAGNOSIS — J3089 Other allergic rhinitis: Secondary | ICD-10-CM | POA: Diagnosis not present

## 2013-11-16 NOTE — Progress Notes (Signed)
Remote ICD transmission.   

## 2013-11-16 NOTE — Telephone Encounter (Signed)
Spoke with pt and reminded pt of remote transmission that is due today. Pt verbalized understanding.   

## 2013-11-17 ENCOUNTER — Encounter: Payer: Self-pay | Admitting: *Deleted

## 2013-11-17 NOTE — Addendum Note (Signed)
Addended by: Alvis Lemmings C on: 11/17/2013 08:46 AM   Modules accepted: Level of Service

## 2013-11-17 NOTE — Progress Notes (Signed)
EPIC Encounter for ICM Monitoring  Patient Name: [name] is a 73 y.o. male Date: 11/17/2013 Primary Care Physican: [name], MD Primary Cardiologist: [name] Electrophysiologist: [name] Dry Weight: 157 lbs       In the past month, have you:  1. Gained more than 2 pounds in a day or more than 5 pounds in a week? No. The patient's weight is 156 lbs today.  2. Had changes in your medications (with verification of current medications)? no  3. Had more shortness of breath than is usual for you? no  4. Limited your activity because of shortness of breath? no  5. Not been able to sleep because of shortness of breath? no  6. Had increased swelling in your feet or ankles? no  7. Had symptoms of dehydration (dizziness, dry mouth, increased thirst, decreased urine output) no  8. Had changes in sodium restriction? no  9. Been compliant with medication? Yes   ICM trend:   Follow-up plan: ICM clinic phone appointment: 12/21/13  Copy of note sent to patient's primary care physician, primary cardiologist, and device following physician.  Alvis Lemmings, RN, BSN 11/17/2013 8:43 AM

## 2013-11-19 DIAGNOSIS — J3089 Other allergic rhinitis: Secondary | ICD-10-CM | POA: Diagnosis not present

## 2013-11-19 LAB — MDC_IDC_ENUM_SESS_TYPE_REMOTE
Battery Voltage: 3.02 V
Brady Statistic RV Percent Paced: 0.18 %
Date Time Interrogation Session: 20151019170217
HighPow Impedance: 50 Ohm
HighPow Impedance: 72 Ohm
Lead Channel Impedance Value: 1152 Ohm
Lead Channel Sensing Intrinsic Amplitude: 17.3 mV
Lead Channel Setting Pacing Amplitude: 2.5 V
Lead Channel Setting Pacing Pulse Width: 0.4 ms
Lead Channel Setting Sensing Sensitivity: 0.3 mV
Zone Setting Detection Interval: 240 ms
Zone Setting Detection Interval: 300 ms
Zone Setting Detection Interval: 340 ms
Zone Setting Detection Interval: 450 ms

## 2013-11-24 DIAGNOSIS — J310 Chronic rhinitis: Secondary | ICD-10-CM | POA: Diagnosis not present

## 2013-11-24 DIAGNOSIS — J301 Allergic rhinitis due to pollen: Secondary | ICD-10-CM | POA: Diagnosis not present

## 2013-11-24 DIAGNOSIS — J3089 Other allergic rhinitis: Secondary | ICD-10-CM | POA: Diagnosis not present

## 2013-11-25 ENCOUNTER — Ambulatory Visit (INDEPENDENT_AMBULATORY_CARE_PROVIDER_SITE_OTHER): Payer: Medicare Other | Admitting: Pharmacist Clinician (PhC)/ Clinical Pharmacy Specialist

## 2013-11-25 DIAGNOSIS — I4891 Unspecified atrial fibrillation: Secondary | ICD-10-CM | POA: Diagnosis not present

## 2013-11-25 DIAGNOSIS — I480 Paroxysmal atrial fibrillation: Secondary | ICD-10-CM | POA: Diagnosis not present

## 2013-11-25 DIAGNOSIS — Z7901 Long term (current) use of anticoagulants: Secondary | ICD-10-CM

## 2013-11-25 LAB — POCT INR: INR: 1.8

## 2013-12-02 ENCOUNTER — Encounter: Payer: Self-pay | Admitting: Cardiology

## 2013-12-02 DIAGNOSIS — J3089 Other allergic rhinitis: Secondary | ICD-10-CM | POA: Diagnosis not present

## 2013-12-03 DIAGNOSIS — H3531 Nonexudative age-related macular degeneration: Secondary | ICD-10-CM | POA: Diagnosis not present

## 2013-12-03 DIAGNOSIS — H468 Other optic neuritis: Secondary | ICD-10-CM | POA: Diagnosis not present

## 2013-12-03 DIAGNOSIS — Z961 Presence of intraocular lens: Secondary | ICD-10-CM | POA: Diagnosis not present

## 2013-12-07 DIAGNOSIS — J3089 Other allergic rhinitis: Secondary | ICD-10-CM | POA: Diagnosis not present

## 2013-12-08 ENCOUNTER — Other Ambulatory Visit: Payer: Self-pay | Admitting: *Deleted

## 2013-12-08 MED ORDER — WARFARIN SODIUM 5 MG PO TABS: ORAL_TABLET | ORAL | Status: DC

## 2013-12-08 NOTE — Telephone Encounter (Signed)
Rx was sent to pharmacy electronically. 

## 2013-12-09 ENCOUNTER — Ambulatory Visit (INDEPENDENT_AMBULATORY_CARE_PROVIDER_SITE_OTHER): Payer: Medicare Other | Admitting: Pharmacist Clinician (PhC)/ Clinical Pharmacy Specialist

## 2013-12-09 ENCOUNTER — Other Ambulatory Visit: Payer: Self-pay | Admitting: Pharmacist Clinician (PhC)/ Clinical Pharmacy Specialist

## 2013-12-09 DIAGNOSIS — I4891 Unspecified atrial fibrillation: Secondary | ICD-10-CM | POA: Diagnosis not present

## 2013-12-09 DIAGNOSIS — Z7901 Long term (current) use of anticoagulants: Secondary | ICD-10-CM

## 2013-12-09 DIAGNOSIS — I480 Paroxysmal atrial fibrillation: Secondary | ICD-10-CM

## 2013-12-09 LAB — POCT INR: INR: 1.8

## 2013-12-09 MED ORDER — WARFARIN SODIUM 5 MG PO TABS: ORAL_TABLET | ORAL | Status: DC

## 2013-12-11 DIAGNOSIS — N401 Enlarged prostate with lower urinary tract symptoms: Secondary | ICD-10-CM | POA: Diagnosis not present

## 2013-12-16 DIAGNOSIS — R972 Elevated prostate specific antigen [PSA]: Secondary | ICD-10-CM | POA: Diagnosis not present

## 2013-12-16 DIAGNOSIS — J3089 Other allergic rhinitis: Secondary | ICD-10-CM | POA: Diagnosis not present

## 2013-12-16 DIAGNOSIS — N401 Enlarged prostate with lower urinary tract symptoms: Secondary | ICD-10-CM | POA: Diagnosis not present

## 2013-12-16 DIAGNOSIS — R351 Nocturia: Secondary | ICD-10-CM | POA: Diagnosis not present

## 2013-12-21 ENCOUNTER — Encounter: Payer: Self-pay | Admitting: *Deleted

## 2013-12-21 ENCOUNTER — Ambulatory Visit (INDEPENDENT_AMBULATORY_CARE_PROVIDER_SITE_OTHER): Payer: Medicare Other | Admitting: *Deleted

## 2013-12-21 DIAGNOSIS — I5022 Chronic systolic (congestive) heart failure: Secondary | ICD-10-CM

## 2013-12-21 DIAGNOSIS — Z95810 Presence of automatic (implantable) cardiac defibrillator: Secondary | ICD-10-CM

## 2013-12-21 NOTE — Progress Notes (Signed)
EPIC Encounter for ICM Monitoring  Patient Name: [name] is a 73 y.o. male Date: 12/21/2013 Primary Care Physican: [name], MD Primary Cardiologist: [name] Electrophysiologist: [name] Dry Weight: 157 lbs       In the past month, have you:  1. Gained more than 2 pounds in a day or more than 5 pounds in a week? no  2. Had changes in your medications (with verification of current medications)? no  3. Had more shortness of breath than is usual for you? no  4. Limited your activity because of shortness of breath? no  5. Not been able to sleep because of shortness of breath? no  6. Had increased swelling in your feet or ankles? no  7. Had symptoms of dehydration (dizziness, dry mouth, increased thirst, decreased urine output) no  8. Had changes in sodium restriction? no  9. Been compliant with medication? Yes   ICM trend:   Follow-up plan: ICM clinic phone appointment: 01/25/14  Copy of note sent to patient's primary care physician, primary cardiologist, and device following physician.  Alvis Lemmings, RN, BSN 12/21/2013 3:49 PM

## 2013-12-23 ENCOUNTER — Ambulatory Visit (INDEPENDENT_AMBULATORY_CARE_PROVIDER_SITE_OTHER): Payer: Medicare Other | Admitting: Pharmacist Clinician (PhC)/ Clinical Pharmacy Specialist

## 2013-12-23 VITALS — BP 118/70 | HR 60

## 2013-12-23 DIAGNOSIS — I4891 Unspecified atrial fibrillation: Secondary | ICD-10-CM | POA: Diagnosis not present

## 2013-12-23 DIAGNOSIS — Z7901 Long term (current) use of anticoagulants: Secondary | ICD-10-CM

## 2013-12-23 DIAGNOSIS — I480 Paroxysmal atrial fibrillation: Secondary | ICD-10-CM | POA: Diagnosis not present

## 2013-12-23 DIAGNOSIS — J3089 Other allergic rhinitis: Secondary | ICD-10-CM | POA: Diagnosis not present

## 2013-12-23 LAB — POCT INR: INR: 1.9

## 2013-12-28 DIAGNOSIS — J3089 Other allergic rhinitis: Secondary | ICD-10-CM | POA: Diagnosis not present

## 2013-12-29 DIAGNOSIS — G5761 Lesion of plantar nerve, right lower limb: Secondary | ICD-10-CM | POA: Diagnosis not present

## 2014-01-05 DIAGNOSIS — J3089 Other allergic rhinitis: Secondary | ICD-10-CM | POA: Diagnosis not present

## 2014-01-05 DIAGNOSIS — G5761 Lesion of plantar nerve, right lower limb: Secondary | ICD-10-CM | POA: Diagnosis not present

## 2014-01-05 DIAGNOSIS — G5762 Lesion of plantar nerve, left lower limb: Secondary | ICD-10-CM | POA: Diagnosis not present

## 2014-01-05 DIAGNOSIS — M79672 Pain in left foot: Secondary | ICD-10-CM | POA: Diagnosis not present

## 2014-01-05 DIAGNOSIS — M7742 Metatarsalgia, left foot: Secondary | ICD-10-CM | POA: Diagnosis not present

## 2014-01-06 ENCOUNTER — Telehealth: Payer: Self-pay | Admitting: Cardiovascular Disease

## 2014-01-06 NOTE — Telephone Encounter (Signed)
Returned call to patient Dr.Kelly advised if you haven't had any lab recently you will need the following cbc,cmet,tsh,lipid,vit d.Faxed to patient at fax # [phone].

## 2014-01-06 NOTE — Telephone Encounter (Signed)
Please call,going to have lab work at his primary doctor. He wants to know what Dr [name] wants him to have,so he can do it all at one time.

## 2014-01-06 NOTE — Telephone Encounter (Signed)
Returned call to patient he stated he will be having lab done at PCP.Stated he wanted to know what lab Dr.Kelly would like him to have done.Stated he would like lab order faxed to him at fax # [phone].Message sent to [facility] for advice.

## 2014-01-06 NOTE — Telephone Encounter (Signed)
If he has not had labs recently; cbc, cmet, tsh, lipid; with h/o vit d insuff vit D if not recently checked

## 2014-01-13 ENCOUNTER — Ambulatory Visit (INDEPENDENT_AMBULATORY_CARE_PROVIDER_SITE_OTHER): Payer: Medicare Other | Admitting: Pharmacist Clinician (PhC)/ Clinical Pharmacy Specialist

## 2014-01-13 DIAGNOSIS — Z7901 Long term (current) use of anticoagulants: Secondary | ICD-10-CM

## 2014-01-13 DIAGNOSIS — J3089 Other allergic rhinitis: Secondary | ICD-10-CM | POA: Diagnosis not present

## 2014-01-13 DIAGNOSIS — I4891 Unspecified atrial fibrillation: Secondary | ICD-10-CM

## 2014-01-13 DIAGNOSIS — I480 Paroxysmal atrial fibrillation: Secondary | ICD-10-CM

## 2014-01-13 LAB — POCT INR: INR: 1.4

## 2014-01-19 DIAGNOSIS — M7741 Metatarsalgia, right foot: Secondary | ICD-10-CM | POA: Diagnosis not present

## 2014-01-19 DIAGNOSIS — G5762 Lesion of plantar nerve, left lower limb: Secondary | ICD-10-CM | POA: Diagnosis not present

## 2014-01-21 DIAGNOSIS — J3089 Other allergic rhinitis: Secondary | ICD-10-CM | POA: Diagnosis not present

## 2014-01-25 ENCOUNTER — Ambulatory Visit (INDEPENDENT_AMBULATORY_CARE_PROVIDER_SITE_OTHER): Payer: Medicare Other | Admitting: Pharmacist Clinician (PhC)/ Clinical Pharmacy Specialist

## 2014-01-25 ENCOUNTER — Ambulatory Visit (INDEPENDENT_AMBULATORY_CARE_PROVIDER_SITE_OTHER): Payer: Medicare Other | Admitting: *Deleted

## 2014-01-25 DIAGNOSIS — Z95810 Presence of automatic (implantable) cardiac defibrillator: Secondary | ICD-10-CM | POA: Diagnosis not present

## 2014-01-25 DIAGNOSIS — Z7901 Long term (current) use of anticoagulants: Secondary | ICD-10-CM | POA: Diagnosis not present

## 2014-01-25 DIAGNOSIS — I4891 Unspecified atrial fibrillation: Secondary | ICD-10-CM | POA: Diagnosis not present

## 2014-01-25 DIAGNOSIS — I480 Paroxysmal atrial fibrillation: Secondary | ICD-10-CM

## 2014-01-25 DIAGNOSIS — I5022 Chronic systolic (congestive) heart failure: Secondary | ICD-10-CM | POA: Diagnosis not present

## 2014-01-25 LAB — POCT INR: INR: 2.5

## 2014-01-26 ENCOUNTER — Telehealth: Payer: Self-pay | Admitting: *Deleted

## 2014-01-26 ENCOUNTER — Encounter: Payer: Self-pay | Admitting: *Deleted

## 2014-01-26 NOTE — Telephone Encounter (Signed)
ICM transmission received. I left a message for the patient to call at his home and cell #'s. 

## 2014-01-26 NOTE — Telephone Encounter (Signed)
F/U ° ° ° ° ° ° ° ° ° °Pt returning call. Please call back.  °

## 2014-01-26 NOTE — Progress Notes (Signed)
EPIC Encounter for ICM Monitoring  Patient Name: [name] is a 73 y.o. male Date: 01/26/2014 Primary Care Physican: [name], MD Primary Cardiologist: [name] Electrophysiologist: [name] Dry Weight: 157 lbs       In the past month, have you:  1. Gained more than 2 pounds in a day or more than 5 pounds in a week? no  2. Had changes in your medications (with verification of current medications)? no  3. Had more shortness of breath than is usual for you? no  4. Limited your activity because of shortness of breath? no  5. Not been able to sleep because of shortness of breath? no  6. Had increased swelling in your feet or ankles? no  7. Had symptoms of dehydration (dizziness, dry mouth, increased thirst, decreased urine output) no  8. Had changes in sodium restriction? no  9. Been compliant with medication? Yes   ICM trend:   Follow-up plan: ICM clinic phone appointment: 03/01/14  Copy of note sent to patient's primary care physician, primary cardiologist, and device following physician.  Alvis Lemmings, RN, BSN 01/26/2014 4:49 PM

## 2014-01-26 NOTE — Telephone Encounter (Signed)
I spoke with the patient. 

## 2014-01-27 DIAGNOSIS — J3089 Other allergic rhinitis: Secondary | ICD-10-CM | POA: Diagnosis not present

## 2014-02-03 DIAGNOSIS — J3089 Other allergic rhinitis: Secondary | ICD-10-CM | POA: Diagnosis not present

## 2014-02-03 DIAGNOSIS — G5762 Lesion of plantar nerve, left lower limb: Secondary | ICD-10-CM | POA: Diagnosis not present

## 2014-02-03 DIAGNOSIS — M7741 Metatarsalgia, right foot: Secondary | ICD-10-CM | POA: Diagnosis not present

## 2014-02-09 ENCOUNTER — Encounter: Payer: Self-pay | Admitting: Cardiovascular Disease

## 2014-02-09 ENCOUNTER — Telehealth: Payer: Self-pay | Admitting: Cardiovascular Disease

## 2014-02-09 ENCOUNTER — Ambulatory Visit (INDEPENDENT_AMBULATORY_CARE_PROVIDER_SITE_OTHER): Payer: Medicare Other | Admitting: Cardiovascular Disease

## 2014-02-09 VITALS — BP 120/72 | HR 63 | Ht 67.0 in | Wt 161.5 lb

## 2014-02-09 DIAGNOSIS — I480 Paroxysmal atrial fibrillation: Secondary | ICD-10-CM

## 2014-02-09 DIAGNOSIS — Z7901 Long term (current) use of anticoagulants: Secondary | ICD-10-CM | POA: Diagnosis not present

## 2014-02-09 DIAGNOSIS — I2510 Atherosclerotic heart disease of native coronary artery without angina pectoris: Secondary | ICD-10-CM | POA: Diagnosis not present

## 2014-02-09 DIAGNOSIS — I2583 Coronary atherosclerosis due to lipid rich plaque: Secondary | ICD-10-CM

## 2014-02-09 MED ORDER — CARVEDILOL 12.5 MG PO TABS: 12.5000 mg | ORAL_TABLET | Freq: Two times a day (BID) | ORAL | Status: DC

## 2014-02-09 NOTE — Patient Instructions (Signed)
Your physician has recommended you make the following change in your medication: increase the carvedilol to 12.5 mg twice a day.  Keep appointment scheduled with [name].

## 2014-02-09 NOTE — Telephone Encounter (Signed)
Calling because the AFib  Has come back. He is not feeling well . Please call   Thanks

## 2014-02-09 NOTE — Telephone Encounter (Signed)
Spoke with patient. Since Sunday and all day yesterday he has been in AF. He has NO chest pain, but does c/o of some SOB. He reports his HR is in the 100s. BP 141/101 yesterday and HR range 84-100 yesterday. He is scheduled to see [name] at 11:15 02/09/14

## 2014-02-11 ENCOUNTER — Encounter: Payer: Self-pay | Admitting: Cardiovascular Disease

## 2014-02-11 DIAGNOSIS — M109 Gout, unspecified: Secondary | ICD-10-CM | POA: Diagnosis not present

## 2014-02-11 DIAGNOSIS — I480 Paroxysmal atrial fibrillation: Secondary | ICD-10-CM | POA: Diagnosis not present

## 2014-02-11 DIAGNOSIS — N400 Enlarged prostate without lower urinary tract symptoms: Secondary | ICD-10-CM | POA: Diagnosis not present

## 2014-02-11 DIAGNOSIS — F419 Anxiety disorder, unspecified: Secondary | ICD-10-CM | POA: Diagnosis not present

## 2014-02-11 DIAGNOSIS — J309 Allergic rhinitis, unspecified: Secondary | ICD-10-CM | POA: Diagnosis not present

## 2014-02-11 DIAGNOSIS — E780 Pure hypercholesterolemia: Secondary | ICD-10-CM | POA: Diagnosis not present

## 2014-02-11 DIAGNOSIS — K219 Gastro-esophageal reflux disease without esophagitis: Secondary | ICD-10-CM | POA: Diagnosis not present

## 2014-02-11 DIAGNOSIS — I10 Essential (primary) hypertension: Secondary | ICD-10-CM | POA: Diagnosis not present

## 2014-02-11 DIAGNOSIS — I2510 Atherosclerotic heart disease of native coronary artery without angina pectoris: Secondary | ICD-10-CM | POA: Diagnosis not present

## 2014-02-11 DIAGNOSIS — K589 Irritable bowel syndrome without diarrhea: Secondary | ICD-10-CM | POA: Diagnosis not present

## 2014-02-11 DIAGNOSIS — E559 Vitamin D deficiency, unspecified: Secondary | ICD-10-CM | POA: Diagnosis not present

## 2014-02-11 DIAGNOSIS — J45909 Unspecified asthma, uncomplicated: Secondary | ICD-10-CM | POA: Diagnosis not present

## 2014-02-11 NOTE — Progress Notes (Signed)
Patient ID: CAMRIN GEARHEART, male   DOB: December 15, 1940, 74 y.o.   MRN: [id number]    HPI: AVONTE SENSABAUGH is a 74 y.o. male who presents to the office for a 4 month cardiology follow up evaluation.   Mr Cohen has a history of an ischemic cardiomyopathy secondary to suffering a large anterior wall myocardial infarction in 1992. In September 1998 he underwent CABG revascularization surgery after an unsuccessful attempt at stenting of his proximal LAD by [name]. Ejection fraction was 25-30%. In 2002 he underwent initial ICD implantation for nonsustained ventricular tachycardia documented on event monitor for primary prevention. In January 2010 he underwent generator change with a  Medtronic Virtuoso single chamber cardioverter defibrillator. Additional problems include paroxysmal atrial fibrillation, occasional PVCs and an attempt is made in the past 2 overdrive suppress his PVCs with reducing his beta blocker therapy patient felt he had PVCs on the reduced dose and therefore has been on the higher dose. Due to bradycardia, his  Lanoxin dose was also reduced.  A nuclear perfusion study  in November 2013 showed a large area of scar in the LAD territory (extent 44%) involving the mid to apical anterior,  apical and infero-apical to mid infero-septal and apical lateral wall without associated ischemia.  When I saw Mr Taras in June 2014 he was complaining of mild tenderness in the breast region. I felt this possibly could be related to his Spironolactone and consequently recommended a trial of Inspra 25 mg to see if this could improve his symptoms. He was seen in the emergency room for presyncope in July and was concerned that his heart was beating erratically. In the emergency room his ICD was interrogated and no arrhythmias were noted. His vital signs were stable. He had  normal troponin. Patient preferred discontinuing the Inspra particularly due to cost and resumed  spironolactone again. He feels that he seems  to tolerate this better.   His last echo Doppler study was done on 01/01/2013. His left ventricle was mildly dilated. Ejection fraction was 25-30%. There was dyskinesis in scarring of the apical myocardium as well as akinesis of the mid anteroseptal, anterior and anterolateral walls consistent with his prior LAD infarction. He does have a history of paroxysmal atrial fibrillation.   Suddenly, he has been maintained on carvedilol 6.25 mg 3 times a day, which is controlled.  Recurrent atrial fibrillation.  Remotely he had been on amiodarone.  He states that since July.  He felt that he has been in normal sinus rhythm but over the past several days he has noticed paroxysms wears heart rate would speed into the 100s and he felt as if he was in atrial fibrillation.  Yesterday he took an extra dose of the carvedilol and presented to the office today for reevaluation.  This morning his heart rate was fast but now he feels like he has slowed down back into the normal range.  He also has a history of retention and has been taking the lisinopril 10 mg twice a day in addition to his carvedilol and low-dose Aldactone at 12.5 mg daily.  He also has a history of mild asthma for which he takes Singulair 10 mg in addition to Asmanex inhaler.  He has a history of hyperlipidemia and is tolerating Zocor 40 mg.  There is a remote history of gout, which he has been on allopurinol at low dose 150 mg daily without recurrence.  He is on Coumadin anticoagulation and denies bleeding is unaware of  anginal symptoms.  He presents for evaluation  Past Medical History  Diagnosis Date  . Coronary artery disease     Hx MI 1992, CABG 1998 , Nuc study 11.2013 large scar but no ischemia  . 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 Atrial fib/flutter, transient   . GERD (gastroesophageal reflux disease)   . H. pylori infection     Hx of   . Cardiomyopathy, ischemic 2011    with EF 25-35%  by echo  . H/O myocardial infarction, greater than 8 weeks 1992    large ant wall injury  . NSVT (nonsustained ventricular tachycardia)     Past Surgical History  Procedure Laterality Date  . Nasal sinus surgery  05/31/2010    Dr. Benjamine Mola  . Coronary artery bypass graft  1998    x 5  . Cataract extraction, bilateral    . Knee surgery      right  . Hernia repair    . Icd generator change  01/2008    medtronic, hx+ EP study    Allergies  Allergen Reactions  . Amoxicillin     REACTION: unspecified  . Penicillins     REACTION: unspecified    Current Outpatient Prescriptions  Medication Sig Dispense Refill  . acetaminophen (TYLENOL) 650 MG CR tablet Take 650 mg by mouth 2 (two) times daily.     Marland Kitchen allopurinol (ZYLOPRIM) 300 MG tablet Take 150 mg by mouth daily.     . cetirizine (ZYRTEC) 10 MG tablet Take 10 mg by mouth daily.      Marland Kitchen esomeprazole (NEXIUM) 40 MG capsule Take 40 mg by mouth daily.    . isosorbide mononitrate (IMDUR) 30 MG 24 hr tablet 1/2 tablet daily.    Marland Kitchen lisinopril (PRINIVIL) 20 MG tablet Take 0.5 tablets (10 mg total) by mouth 2 (two) times daily. 90 tablet 3  . mometasone (ASMANEX) 220 MCG/INH inhaler Inhale 1 puff into the lungs daily.     . mometasone (NASONEX) 50 MCG/ACT nasal spray Place 2 sprays into the nose daily.    . montelukast (SINGULAIR) 10 MG tablet Take 1 tablet (10 mg total) by mouth at bedtime. 90 tablet 3  . nitroGLYCERIN (NITROSTAT) 0.4 MG SL tablet Place 0.4 mg under the tongue every 5 (five) minutes as needed for chest pain.    . silodosin (RAPAFLO) 4 MG CAPS capsule Take 8 mg by mouth. Every 2 days    . simvastatin (ZOCOR) 40 MG tablet Take 1 tablet (40 mg total) by mouth at bedtime. 90 tablet 3  . spironolactone (ALDACTONE) 25 MG tablet Take 0.5 tablets (12.5 mg total) by mouth daily. 45 tablet 3  . warfarin (COUMADIN) 5 MG tablet Take 1 tablet by mouth daily or as directed by coumadin clinic 90 tablet 1  . carvedilol (COREG) 12.5 MG tablet  Take 1 tablet (12.5 mg total) by mouth 2 (two) times daily. 180 tablet 3   No current facility-administered medications for this visit.    Socially he is married and has 3 children and 5 grandchildren. He does exercise most recently with walking. He used to play tennis. There is no tobacco or alcohol use.  ROS General: Negative; No fevers, chills, or night sweats;  HEENT: Negative; No changes in vision or hearing, sinus congestion, difficulty swallowing Pulmonary:  Positive for mild asthma; No cough, wheezing, shortness of breath, hemoptysis Cardiovascular: Negative; No chest pain, presyncope, syncope, palpitations GI: History of GERD No  nausea, vomiting, diarrhea, or abdominal pain GU: Negative; No dysuria, hematuria, or difficulty voiding Musculoskeletal: Negative; no myalgias, joint pain, or weakness Hematologic/Oncology: Negative; no easy bruising, bleeding Endocrine: Negative; no heat/cold intolerance; no diabetes Neuro: Negative; no changes in balance, headaches Skin: Negative; No rashes or skin lesions Psychiatric: Negative; No behavioral problems, depression Sleep: Negative; No snoring, daytime sleepiness, hypersomnolence, bruxism, restless legs, hypnogognic hallucinations, no cataplexy Other comprehensive 14 point system review is negative.   PE BP 120/72 mmHg  Pulse 63  Ht 5\' 7"  (1.702 m)  Wt 161 lb 8 oz (73.256 kg)  BMI 25.29 kg/m2 repeat supine blood pressure 120/70 and standing blood pressure 110/70, without orthostatic change. General: Alert, oriented, no distress.  Skin: normal turgor, no rashes HEENT: Normocephalic, atraumatic. Pupils round and reactive; sclera anicteric;no lid lag,  Nose without nasal septal hypertrophy Mouth/Parynx benign; Mallinpatti scale 2 Neck: No JVD, no carotid bruits; normal carotid upstroke Chest wall: No tenderness to palpation Lungs: clear to ausculatation and percussion; no wheezing or rales Heart: RRR, s1 s2 normal 4-0/9 systolic  murmur, unchanged.  No diastolic murmur No S3 gallop. No heave or parasternal lift. No rub. Abdomen: soft, nontender; no hepatosplenomehaly, BS+; abdominal aorta nontender and not dilated by palpation. Back: No CVA tenderness Pulses 2+ Extremities: no clubbing cyanosis or edema, Homan's sign negative  Neurologic: grossly nonfocal; cranial nerves normal Psychological: Normal affect and mood.  ECG (independently read by me) normal sinus rhythm with sinus arrhythmia at 63 bpm.  One isolated PVC.  10/06/2013 (independently read by me): Sinus rhythm at 56 beats per minute with occasional isolated PVC.  06/01/2013 ECG (independently read by me): Sinus bradycardia at 49 beats per minute.  No ectopy.  QTc interval 45 ms.  Prior ECG (independently read by me): Sinus bradycardia 52 beats per minute. Old anterior lateral myocardial infarction. Nonspecific interventricular conduction delay. Previously noted T-wave changes.  ECG from 08/28/2012: Sinus bradycardia 52 beats per minute. No ectopy. Diffuse T wave abnormality the 4 through V6 1 and L. Poor R-wave progression compatible with prior anterior wall MI.  LABS:  BMET    Component Value Date/Time   NA 137 06/01/2013 0813   K 4.0 06/01/2013 0813   CL 103 06/01/2013 0813   CO2 25 06/01/2013 0813   GLUCOSE 93 06/01/2013 0813   BUN 9 06/01/2013 0813   CREATININE 0.91 06/01/2013 0813   CREATININE 0.75 04/21/2013 1426   CALCIUM 9.1 06/01/2013 0813   GFRNONAA 89* 04/21/2013 1426   GFRAA >90 04/21/2013 1426     Hepatic Function Panel     Component Value Date/Time   PROT 6.4 06/01/2013 0813   ALBUMIN 4.0 06/01/2013 0813   AST 17 06/01/2013 0813   ALT 14 06/01/2013 0813   ALKPHOS 45 06/01/2013 0813   BILITOT 0.6 06/01/2013 0813   BILIDIR 0.2 11/07/2012 1509     CBC    Component Value Date/Time   WBC 7.7 06/01/2013 0813   RBC 5.12 06/01/2013 0813   HGB 14.3 06/01/2013 0813   HCT 42.7 06/01/2013 0813   PLT 303 06/01/2013 0813    MCV 83.4 06/01/2013 0813   MCH 27.9 06/01/2013 0813   MCHC 33.5 06/01/2013 0813   RDW 14.8 06/01/2013 0813   LYMPHSABS 1.6 11/07/2012 1509   MONOABS 0.9 11/07/2012 1509   EOSABS 0.3 11/07/2012 1509   BASOSABS 0.0 11/07/2012 1509     BNP No results found for: PROBNP  Lipid Panel     Component Value Date/Time  CHOL 111 06/01/2013 0813     RADIOLOGY: No results found.    ASSESSMENT AND PLAN:  Mr. Modena Slater suffered a large anterior wall myocardial infarction and has ischemic cardiomyopathy with an ejection fraction in the 25-35% range. His last nuclear perfusion study in November 2013 was nonischemic. He is doing well without CHF symptoms.  He has had issues with paroxysmal atrial fibrillation.  This did improve with resumption of amiodarone remotely.  He has been taking carvedilol now at 6.25 mg 3 times a day and he feels that he tolerates this better and had less fatigue.  His history is suggestive of recurrent episodes of AF over the past 48 hours.  He is maintained on Coumadin anticoagulation and his INR was recently 2.5.  I recommended further slight titration of his carvedilol to 12.5 mg twice a day instead of 6.25 mg 3 times a day.  He tells me blood work is to be done by Dr. Kenton Kingfisher at [place].  He is not having any anginal symptoms.  His blood pressure today is stable, would oil orthostatic changes.  His ECG today confirms sinus rhythm.  I will see him in 2-3 months for reevaluation or sooner if problems arise.  Time spent: 25 minutes   Troy Sine, MD, [facility]  02/11/2014 8:02 PM

## 2014-02-12 DIAGNOSIS — J3089 Other allergic rhinitis: Secondary | ICD-10-CM | POA: Diagnosis not present

## 2014-02-17 DIAGNOSIS — J3089 Other allergic rhinitis: Secondary | ICD-10-CM | POA: Diagnosis not present

## 2014-02-18 ENCOUNTER — Ambulatory Visit (INDEPENDENT_AMBULATORY_CARE_PROVIDER_SITE_OTHER): Payer: Medicare Other | Admitting: *Deleted

## 2014-02-18 ENCOUNTER — Telehealth: Payer: Self-pay | Admitting: Cardiology

## 2014-02-18 ENCOUNTER — Encounter: Payer: Self-pay | Admitting: *Deleted

## 2014-02-18 ENCOUNTER — Encounter: Payer: Self-pay | Admitting: Internal Medicine

## 2014-02-18 DIAGNOSIS — I4729 Other ventricular tachycardia: Secondary | ICD-10-CM

## 2014-02-18 DIAGNOSIS — I472 Ventricular tachycardia: Secondary | ICD-10-CM

## 2014-02-18 LAB — MDC_IDC_ENUM_SESS_TYPE_REMOTE
Date Time Interrogation Session: 20160121194829
HighPow Impedance: 50 Ohm
HighPow Impedance: 72 Ohm
Lead Channel Impedance Value: 1136 Ohm
Lead Channel Setting Pacing Amplitude: 2.5 V
Lead Channel Setting Pacing Pulse Width: 0.4 ms
MDC IDC MSMT BATTERY VOLTAGE: 2.96 V
MDC IDC MSMT LEADCHNL RV SENSING INTR AMPL: 15.9744
MDC IDC SET LEADCHNL RV SENSING SENSITIVITY: 0.3 mV
MDC IDC SET ZONE DETECTION INTERVAL: 340 ms
MDC IDC STAT BRADY RV PERCENT PACED: 0.02 %
Zone Setting Detection Interval: 240 ms
Zone Setting Detection Interval: 300 ms
Zone Setting Detection Interval: 450 ms

## 2014-02-18 NOTE — Telephone Encounter (Signed)
Spoke with pt and reminded pt of remote transmission that is due today. Pt verbalized understanding.   

## 2014-02-18 NOTE — Progress Notes (Signed)
Will not charge for ICM as I just checked the patient on 01/25/14.  EPIC Encounter for ICM Monitoring  Patient Name: [name] is a 74 y.o. male Date: 02/18/2014 Primary Care Physican: [name], MD Primary Cardiologist: [name] Electrophysiologist: [name] Dry Weight: 157 lbs       In the past month, have you:  1. Gained more than 2 pounds in a day or more than 5 pounds in a week? no  2. Had changes in your medications (with verification of current medications)? no  3. Had more shortness of breath than is usual for you? no  4. Limited your activity because of shortness of breath? no  5. Not been able to sleep because of shortness of breath? no  6. Had increased swelling in your feet or ankles? no  7. Had symptoms of dehydration (dizziness, dry mouth, increased thirst, decreased urine output) no  8. Had changes in sodium restriction? no  9. Been compliant with medication? Yes   ICM trend:   Follow-up plan: ICM clinic phone appointment: 03/25/14  Copy of note sent to patient's primary care physician, primary cardiologist, and device following physician.  Alvis Lemmings, RN, BSN 02/18/2014 4:35 PM

## 2014-02-18 NOTE — Addendum Note (Signed)
Addended by: Alvis Lemmings C on: 02/18/2014 04:38 PM   Modules accepted: Level of Service

## 2014-02-18 NOTE — Progress Notes (Signed)
Remote ICD transmission.   

## 2014-02-19 ENCOUNTER — Telehealth: Payer: Self-pay | Admitting: Nurse Practitioner

## 2014-02-19 NOTE — Telephone Encounter (Signed)
   Pt called this AM to report that over the past few days, he has had more frequent episodes of afib.  He is on coreg 12.5 bid (just increased to this on 1/12) and when afib occurs, he takes an additional 6.25.  BP's have been 1-teens to 120's.  When he is in afib, he feels sob and it can last a few hrs.  He is wondering what we may be able to do differently to prevent afib.  I rec that if BP is stable, he can increase his coreg to 18.75 mg bid vs continuing to take 12.5 mg bid and taking 6.25 mg bid prn for palpitations.  He wanted to come into the office but it is closed today 2/2 weather.  He will increase to 18.75 bid and follow bp.  I further advised that if he has persistent Ss over the weekend, then he will need to come into the ED for evaluation.  We discussed how ultimately, he will likely require resumption of antiarrhythmic therapy if bb alone cannot control his paroxysms.  He was grateful for call back.  Murray Hodgkins, NP 02/19/2014, 8:08 AM

## 2014-02-20 NOTE — Telephone Encounter (Signed)
agree

## 2014-02-22 ENCOUNTER — Ambulatory Visit: Payer: Medicare Other | Admitting: Pharmacist Clinician (PhC)/ Clinical Pharmacy Specialist

## 2014-02-23 ENCOUNTER — Ambulatory Visit (INDEPENDENT_AMBULATORY_CARE_PROVIDER_SITE_OTHER): Payer: Medicare Other | Admitting: Pharmacist Clinician (PhC)/ Clinical Pharmacy Specialist

## 2014-02-23 DIAGNOSIS — Z7901 Long term (current) use of anticoagulants: Secondary | ICD-10-CM

## 2014-02-23 DIAGNOSIS — I4891 Unspecified atrial fibrillation: Secondary | ICD-10-CM | POA: Diagnosis not present

## 2014-02-23 DIAGNOSIS — I480 Paroxysmal atrial fibrillation: Secondary | ICD-10-CM | POA: Diagnosis not present

## 2014-02-23 DIAGNOSIS — J3089 Other allergic rhinitis: Secondary | ICD-10-CM | POA: Diagnosis not present

## 2014-02-23 LAB — POCT INR: INR: 3

## 2014-03-02 DIAGNOSIS — H66001 Acute suppurative otitis media without spontaneous rupture of ear drum, right ear: Secondary | ICD-10-CM | POA: Diagnosis not present

## 2014-03-02 DIAGNOSIS — J3089 Other allergic rhinitis: Secondary | ICD-10-CM | POA: Diagnosis not present

## 2014-03-02 DIAGNOSIS — J019 Acute sinusitis, unspecified: Secondary | ICD-10-CM | POA: Diagnosis not present

## 2014-03-02 DIAGNOSIS — J4540 Moderate persistent asthma, uncomplicated: Secondary | ICD-10-CM | POA: Diagnosis not present

## 2014-03-08 ENCOUNTER — Encounter: Payer: Self-pay | Admitting: Cardiology

## 2014-03-09 DIAGNOSIS — M7741 Metatarsalgia, right foot: Secondary | ICD-10-CM | POA: Diagnosis not present

## 2014-03-09 DIAGNOSIS — G5762 Lesion of plantar nerve, left lower limb: Secondary | ICD-10-CM | POA: Diagnosis not present

## 2014-03-10 ENCOUNTER — Other Ambulatory Visit: Payer: Self-pay | Admitting: Cardiovascular Disease

## 2014-03-10 ENCOUNTER — Telehealth: Payer: Self-pay | Admitting: Gastroenterology

## 2014-03-10 ENCOUNTER — Telehealth: Payer: Self-pay | Admitting: Cardiovascular Disease

## 2014-03-10 ENCOUNTER — Ambulatory Visit: Payer: Medicare Other | Admitting: Nurse Practitioner

## 2014-03-10 NOTE — Telephone Encounter (Signed)
Former Careers information officer patient. He would like Dr. Hilarie Fredrickson. He is having IBS problems. He needs a new prescription for Dicyclomine. He is going out of town and would like to be seen today or tomorrow. Scheduled with [name], NP today at 2:00 PM.

## 2014-03-10 NOTE — Telephone Encounter (Signed)
Rx refill sent to patient pharmacy   

## 2014-03-10 NOTE — Telephone Encounter (Signed)
Mr. Anacker is calling a medication change and would like to speak to someone . Please call    Thanks

## 2014-03-10 NOTE — Telephone Encounter (Signed)
Pt had been having occasional palpitations prior to last OV. Coreg was increased which seems to have helped some, but he is still having these occasionally.  Denies other symptoms.  He is taking Coreg 12.5 BID w/ an additional 6.25mg  dose mid-day.  Please advise.

## 2014-03-11 NOTE — Telephone Encounter (Signed)
As long as resting pulse is > 55 can try to change to 18.75 mg bid

## 2014-03-12 MED ORDER — CARVEDILOL 12.5 MG PO TABS: 12.5000 mg | ORAL_TABLET | Freq: Two times a day (BID) | ORAL | Status: DC

## 2014-03-12 MED ORDER — CARVEDILOL 6.25 MG PO TABS: 6.2500 mg | ORAL_TABLET | Freq: Two times a day (BID) | ORAL | Status: DC

## 2014-03-12 NOTE — Telephone Encounter (Signed)
Pt confirmed instructions. Resting HR in 60s, he will trial this dose and update [name] next week.

## 2014-03-17 DIAGNOSIS — J3089 Other allergic rhinitis: Secondary | ICD-10-CM | POA: Diagnosis not present

## 2014-03-22 DIAGNOSIS — J3089 Other allergic rhinitis: Secondary | ICD-10-CM | POA: Diagnosis not present

## 2014-03-25 ENCOUNTER — Ambulatory Visit (INDEPENDENT_AMBULATORY_CARE_PROVIDER_SITE_OTHER): Payer: Medicare Other | Admitting: *Deleted

## 2014-03-25 ENCOUNTER — Encounter: Payer: Self-pay | Admitting: *Deleted

## 2014-03-25 DIAGNOSIS — Z95810 Presence of automatic (implantable) cardiac defibrillator: Secondary | ICD-10-CM | POA: Diagnosis not present

## 2014-03-25 DIAGNOSIS — I5022 Chronic systolic (congestive) heart failure: Secondary | ICD-10-CM

## 2014-03-25 NOTE — Progress Notes (Signed)
EPIC Encounter for ICM Monitoring  Patient Name: [name] is a 74 y.o. male Date: 03/25/2014 Primary Care Physican: [name], MD Primary Cardiologist: [name] Electrophysiologist: [name] Dry Weight: 157 lbs       In the past month, have you:  1. Gained more than 2 pounds in a day or more than 5 pounds in a week? no  2. Had changes in your medications (with verification of current medications)? Yes. The patient called Dr. Evette Georges office on 2/12 with complaints of some intermittent palpitations, which he feels is his a-fib. Coreg was increased to 18.75 mg BID. The patient is tolerating this dose well.  3. Had more shortness of breath than is usual for you? Somewhat. The patient states he also has allergies and is unsure if his intermittent SOB is due to this.  4. Limited your activity because of shortness of breath? no  5. Not been able to sleep because of shortness of breath? no  6. Had increased swelling in your feet or ankles? no  7. Had symptoms of dehydration (dizziness, dry mouth, increased thirst, decreased urine output) no  8. Had changes in sodium restriction? no  9. Been compliant with medication? Yes   ICM trend:   Follow-up plan: ICM clinic phone appointment: 04/26/14. The patient's optivol readings were elevated from ~ 1/22-2/22. The patient has had some intermittent a-fib per his report. Readings appear to be back to baseline today. He is having some intermittent SOB, which may be due to allergies. I have advised the patient if he wants to try increasing his spironolactone to a full 25 mg x 2-3 days to see if this helps, he may do this. BP today is 117/72. HR- 65. He is due to follow up with [name] on 04/05/14.  Copy of note sent to patient's primary care physician, primary cardiologist, and device following physician.  Alvis Lemmings, RN, BSN 03/25/2014 12:12 PM

## 2014-04-01 DIAGNOSIS — J3089 Other allergic rhinitis: Secondary | ICD-10-CM | POA: Diagnosis not present

## 2014-04-05 ENCOUNTER — Ambulatory Visit: Payer: Medicare Other | Admitting: Pharmacist Clinician (PhC)/ Clinical Pharmacy Specialist

## 2014-04-05 ENCOUNTER — Ambulatory Visit (INDEPENDENT_AMBULATORY_CARE_PROVIDER_SITE_OTHER): Payer: Medicare Other | Admitting: Pharmacist Clinician (PhC)/ Clinical Pharmacy Specialist

## 2014-04-05 ENCOUNTER — Ambulatory Visit (INDEPENDENT_AMBULATORY_CARE_PROVIDER_SITE_OTHER): Payer: Medicare Other | Admitting: Cardiovascular Disease

## 2014-04-05 VITALS — BP 110/72 | HR 62 | Ht 67.0 in | Wt 159.6 lb

## 2014-04-05 DIAGNOSIS — I4891 Unspecified atrial fibrillation: Secondary | ICD-10-CM

## 2014-04-05 DIAGNOSIS — I480 Paroxysmal atrial fibrillation: Secondary | ICD-10-CM

## 2014-04-05 DIAGNOSIS — I255 Ischemic cardiomyopathy: Secondary | ICD-10-CM

## 2014-04-05 DIAGNOSIS — I2510 Atherosclerotic heart disease of native coronary artery without angina pectoris: Secondary | ICD-10-CM

## 2014-04-05 DIAGNOSIS — R002 Palpitations: Secondary | ICD-10-CM | POA: Diagnosis not present

## 2014-04-05 DIAGNOSIS — Z7901 Long term (current) use of anticoagulants: Secondary | ICD-10-CM

## 2014-04-05 DIAGNOSIS — Z79899 Other long term (current) drug therapy: Secondary | ICD-10-CM | POA: Diagnosis not present

## 2014-04-05 DIAGNOSIS — I5022 Chronic systolic (congestive) heart failure: Secondary | ICD-10-CM | POA: Diagnosis not present

## 2014-04-05 DIAGNOSIS — I2583 Coronary atherosclerosis due to lipid rich plaque: Principal | ICD-10-CM

## 2014-04-05 LAB — POCT INR: INR: 3.5

## 2014-04-05 MED ORDER — SPIRONOLACTONE 25 MG PO TABS: ORAL_TABLET | ORAL | Status: DC

## 2014-04-05 NOTE — Patient Instructions (Addendum)
Your physician has recommended you make the following change in your medication: increase the spironalactone to 1/2 tablet twice daily.  Your physician has requested that you have an echocardiogram. Echocardiography is a painless test that uses sound waves to create images of your heart. It provides your doctor with information about the size and shape of your heart and how well your heart's chambers and valves are working. This procedure takes approximately one hour. There are no restrictions for this procedure.   Your physician recommends that you return for lab work in: 2 weeks.  Your physician recommends that you schedule a follow-up appointment in: 3 months.

## 2014-04-07 ENCOUNTER — Telehealth: Payer: Self-pay | Admitting: Internal Medicine

## 2014-04-07 DIAGNOSIS — J301 Allergic rhinitis due to pollen: Secondary | ICD-10-CM | POA: Diagnosis not present

## 2014-04-07 DIAGNOSIS — J3089 Other allergic rhinitis: Secondary | ICD-10-CM | POA: Diagnosis not present

## 2014-04-07 NOTE — Telephone Encounter (Signed)
Left message for pt to call back  °

## 2014-04-07 NOTE — Telephone Encounter (Signed)
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 Hilarie Fredrickson. Pt scheduled to see Alonza Bogus PA tomorrow at 1:30pm. Pt aware of appt.

## 2014-04-08 ENCOUNTER — Encounter: Payer: Self-pay | Admitting: Gastroenterology

## 2014-04-08 ENCOUNTER — Encounter: Payer: Self-pay | Admitting: Cardiovascular Disease

## 2014-04-08 ENCOUNTER — Ambulatory Visit (INDEPENDENT_AMBULATORY_CARE_PROVIDER_SITE_OTHER): Payer: Medicare Other | Admitting: Gastroenterology

## 2014-04-08 VITALS — BP 104/54 | HR 60 | Ht 67.0 in | Wt 158.0 lb

## 2014-04-08 DIAGNOSIS — R1013 Epigastric pain: Secondary | ICD-10-CM

## 2014-04-08 DIAGNOSIS — I2510 Atherosclerotic heart disease of native coronary artery without angina pectoris: Secondary | ICD-10-CM

## 2014-04-08 DIAGNOSIS — I255 Ischemic cardiomyopathy: Secondary | ICD-10-CM | POA: Insufficient documentation

## 2014-04-08 DIAGNOSIS — G8929 Other chronic pain: Secondary | ICD-10-CM | POA: Diagnosis not present

## 2014-04-08 DIAGNOSIS — K30 Functional dyspepsia: Secondary | ICD-10-CM | POA: Insufficient documentation

## 2014-04-08 DIAGNOSIS — J3089 Other allergic rhinitis: Secondary | ICD-10-CM | POA: Diagnosis not present

## 2014-04-08 MED ORDER — ESOMEPRAZOLE MAGNESIUM 40 MG PO CPDR: 40.0000 mg | DELAYED_RELEASE_CAPSULE | Freq: Every day | ORAL | Status: DC

## 2014-04-08 MED ORDER — DICYCLOMINE HCL 10 MG PO CAPS: 10.0000 mg | ORAL_CAPSULE | Freq: Two times a day (BID) | ORAL | Status: DC | PRN

## 2014-04-08 NOTE — Progress Notes (Signed)
     04/08/2014 [name] [id number] 1940-12-03   History of Present Illness:  This is a 74 year old male who was previously followed by Dr. Sharlett Iles and treated for functional dyspepsia.  He has chronic cardiovascular disease requiring defibrillator implantation and chronic anticoagulation with coumadin for various arrhythmias, previous bypass surgery, history of chronic cardiomyopathy, atrial fibrillation, and ventricular tachycardia.    He presents to our office today with complaints of upper abdominal discomfort for the past couple of weeks.  He says that this is the same pain that he's had for several years.  It comes and goes.  Not bothering him much today.  No evaluation has been performed except for Cologuard (negative in 03/2013) due to patient refusing/decling other testing.  He is on Nexium daily and has dicyclomine, which he takes prn and is asking for new prescriptions for those.  We discussed CT scan for evaluation and could also consider EGD, however, patient continues to decline.  Says "I think that it will go away like it always does".  No alarm symptoms such as weight loss, dark or bloody stool, nausea, vomiting, fevers, dysphagia, etc.  Says that stools have been a little softer than normal recently, but no major changes.   Current Medications, Allergies, Past Medical History, Past Surgical History, Family History and Social History were reviewed in [employer] record.   Physical Exam: BP 104/54 mmHg  Pulse 60  Ht 5\' 7"  (1.702 m)  Wt 158 lb (71.668 kg)  BMI 24.74 kg/m2 General: Well developed white male in no acute distress Head: Normocephalic and atraumatic Eyes:  Sclerae anicteric, conjunctiva pink  Ears: Normal auditory acuity Lungs: Clear throughout to auscultation Heart: Regular rate and rhythm Abdomen: Soft, non-distended.  Normal bowel sounds.  Non-tender. Musculoskeletal: Symmetrical with no gross deformities  Extremities: No edema    Neurological: Alert oriented x 4, grossly non-focal Psychological:  Alert and cooperative. Normal mood and affect  Assessment and Recommendations: -Recurrent complaints of epigastric abdominal discomfort:  Previously followed by Dr. Sharlett Iles and treated for IBS/functional dyspepsia.  No evaluation has been performed except for Cologuard (negative in 03/2013) due to patient refusing/decling other testing.  He is on Nexium daily and has dicyclomine, which he takes prn and is asking for new prescriptions for those.  We discussed CT scan for evaluation and could also consider EGD, however, patient continues to decline.  Says "I think that it will go away like it always does".  No alarm symptoms.  Patient to call back if discomfort continues over the next month and would then order CT scan of the abdomen and pelvis with contrast. -Multiple cardiac issues including need for coumadin

## 2014-04-08 NOTE — Patient Instructions (Signed)
We have sent the following medications to your pharmacy for you to pick up at your convenience: Bentyl and Nexium.  Call back if your symptoms get worse or if you decide to get a CT scan.

## 2014-04-08 NOTE — Progress Notes (Signed)
Reviewed and agree with management.  Should pain recur or worsen in RUQ would get HIDA scan.  Prior ultrasound was negative. Sandy Salaam. Deatra Ina, M.D., [facility]

## 2014-04-08 NOTE — Progress Notes (Signed)
Patient ID: WOODFIN [name], male   DOB: 03-17-1940, 74 y.o.   MRN: [id number] .tk    HPI: [name] is a 74 y.o. male who presents to the office for a 2 month cardiology follow up evaluation.   [name] has a history of an ischemic cardiomyopathy secondary to suffering a large anterior wall myocardial infarction in 1992. In September 1998 he underwent CABG revascularization surgery after an unsuccessful attempt at stenting of his proximal LAD by [name]. Ejection fraction was 25-30%. In 2002 he underwent initial ICD implantation for nonsustained ventricular tachycardia documented on event monitor for primary prevention. In January 2010 he underwent generator change with a  Medtronic Virtuoso single chamber cardioverter defibrillator. Additional problems include paroxysmal atrial fibrillation, occasional PVCs and an attempt is made in the past to overdrive suppress his PVCs with reducing his beta blocker therapy.  He felt he had PVCs on the reduced dose and therefore has been on the higher dose.   A nuclear perfusion study  in November 2013 showed a large area of scar in the LAD territory (extent 44%) involving the mid to apical anterior,  apical and infero-apical to mid infero-septal and apical lateral wall without associated ischemia.  An echo Doppler study  on 01/01/2013 revealed a mildly dilated LV. Ejection fraction was 25-30%. There was dyskinesis in scarring of the apical myocardium as well as akinesis of the mid anteroseptal, anterior and anterolateral walls consistent with his prior LAD infarction. He does have a history of paroxysmal atrial fibrillation.   And I saw him, he had complained of some episodes of recurrent AF.  I recommended slight additional titration of his carvedilol to 12.5 mg gtwice a day instead of 6.25 3 times a day, which he had been taking.  His dose was further titrated to 18.75 mg twice a day.  Remotely he had been on amiodarone.  He also has been taking lisinopril  10 mg twice a day, Imdur 15 mg.  He is on warfarin anticoagulation.   He also has a history of mild asthma for which he takes Singulair 10 mg in addition to Asmanex inhaler.  He has a history of hyperlipidemia and is tolerating Zocor 40 mg.  There is a remote history of gout, which he has been on allopurinol at low dose 150 mg daily without recurrence.  He is on Coumadin anticoagulation and denies bleeding is unaware of anginal symptoms.   Since I last saw him in January, he had a optivol readings which were elevated from January 22 through February 22.  He had some intermittent atrial fibrillation during this.  And also noted some intermittent shortness of breath, which he felt may be due to allergies.  He presents now for follow-up evaluation.    Past Medical History  Diagnosis Date  . Coronary artery disease     Hx MI 1992, CABG 1998 , Nuc study 11.2013 large scar but no ischemia  . 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 Atrial fib/flutter, transient   . GERD (gastroesophageal reflux disease)   . H. pylori infection     Hx of   . Cardiomyopathy, ischemic 2011    with EF 25-35% by echo  . H/O myocardial infarction, greater than 8 weeks 1992    large ant wall injury  . NSVT (nonsustained ventricular tachycardia)     Past Surgical History  Procedure Laterality Date  . Nasal sinus surgery  05/31/2010  Dr. Benjamine Mola  . Coronary artery bypass graft  1998    x 5  . Cataract extraction, bilateral    . Knee surgery      right  . Hernia repair    . Icd generator change  01/2008    medtronic, hx+ EP study    Allergies  Allergen Reactions  . Amoxicillin     REACTION: unspecified  . Penicillins     REACTION: unspecified    Current Outpatient Prescriptions  Medication Sig Dispense Refill  . Cholecalciferol (VITAMIN D) 2000 UNITS CAPS Take 1 capsule by mouth daily.    . magnesium oxide (MAG-OX) 400 MG tablet Take 400 mg by mouth daily.      Marland Kitchen acetaminophen (TYLENOL) 650 MG CR tablet Take 650 mg by mouth 2 (two) times daily.     Marland Kitchen allopurinol (ZYLOPRIM) 300 MG tablet Take 150 mg by mouth daily.     . carvedilol (COREG) 12.5 MG tablet Take 1 tablet (12.5 mg total) by mouth 2 (two) times daily. 180 tablet 3  . carvedilol (COREG) 6.25 MG tablet Take 1 tablet (6.25 mg total) by mouth 2 (two) times daily. 180 tablet 3  . cetirizine (ZYRTEC) 10 MG tablet Take 10 mg by mouth daily.      Marland Kitchen dicyclomine (BENTYL) 10 MG capsule Take 1 capsule (10 mg total) by mouth 2 (two) times daily as needed for spasms. 60 capsule 11  . esomeprazole (NEXIUM) 40 MG capsule Take 1 capsule (40 mg total) by mouth daily. 30 capsule 11  . isosorbide mononitrate (IMDUR) 30 MG 24 hr tablet 1/2 tablet daily.    Marland Kitchen lisinopril (PRINIVIL,ZESTRIL) 20 MG tablet Take 0.5 tablets (10 mg total) by mouth 2 (two) times daily. 90 tablet 3  . mometasone (ASMANEX) 220 MCG/INH inhaler Inhale 1 puff into the lungs daily.     . mometasone (NASONEX) 50 MCG/ACT nasal spray Place 2 sprays into the nose daily.    . montelukast (SINGULAIR) 10 MG tablet Take 1 tablet (10 mg total) by mouth at bedtime. 90 tablet 3  . nitroGLYCERIN (NITROSTAT) 0.4 MG SL tablet Place 0.4 mg under the tongue every 5 (five) minutes as needed for chest pain.    . silodosin (RAPAFLO) 4 MG CAPS capsule Take 8 mg by mouth. Every 2 days    . simvastatin (ZOCOR) 40 MG tablet Take 1 tablet (40 mg total) by mouth at bedtime. 90 tablet 3  . spironolactone (ALDACTONE) 25 MG tablet Take 0.5 tablet twice daily. 90 tablet 3  . warfarin (COUMADIN) 5 MG tablet Take 1 tablet by mouth daily or as directed by coumadin clinic 90 tablet 1   No current facility-administered medications for this visit.    Socially he is married and has 3 children and 5 grandchildren. He does exercise most recently with walking. He used to play tennis. There is no tobacco or alcohol use.  ROS General: Negative; No fevers, chills, or night  sweats;  HEENT: Negative; No changes in vision or hearing, sinus congestion, difficulty swallowing Pulmonary:  Positive for mild asthma; No cough, wheezing, shortness of breath, hemoptysis Cardiovascular: see history of present illness  GI: History of GERD No nausea, vomiting, diarrhea, or abdominal pain GU: Negative; No dysuria, hematuria, or difficulty voiding Musculoskeletal: Negative; no myalgias, joint pain, or weakness Hematologic/Oncology: Negative; no easy bruising, bleeding Endocrine: Negative; no heat/cold intolerance; no diabetes Neuro: Negative; no changes in balance, headaches Skin: Negative; No rashes or skin lesions Psychiatric: Negative; No behavioral problems,  depression Sleep: Negative; No snoring, daytime sleepiness, hypersomnolence, bruxism, restless legs, hypnogognic hallucinations, no cataplexy Other comprehensive 14 point system review is negative.   PE BP 110/72 mmHg  Pulse 62  Ht 5' 7"  (1.702 m)  Wt 159 lb 9.6 oz (72.394 kg)  BMI 24.99 kg/m2 repeat supine blood pressure 120/70 and standing blood pressure 110/70, without orthostatic change. General: Alert, oriented, no distress.  Skin: normal turgor, no rashes HEENT: Normocephalic, atraumatic. Pupils round and reactive; sclera anicteric;no lid lag,  Nose without nasal septal hypertrophy Mouth/Parynx benign; Mallinpatti scale 2 Neck: No JVD, no carotid bruits; normal carotid upstroke Chest wall: No tenderness to palpation Lungs: clear to ausculatation and percussion; no wheezing or rales Heart: RRR, s1 s2 normal 2/6 systolic murmur.  No diastolic murmur No S3 gallop. No heave or parasternal lift. No rub. Abdomen: soft, nontender; no hepatosplenomehaly, BS+; abdominal aorta nontender and not dilated by palpation. Back: No CVA tenderness Pulses 2+ Extremities: no clubbing cyanosis or edema, Homan's sign negative  Neurologic: grossly nonfocal; cranial nerves normal Psychological: Normal affect and mood.  ECG  (independently read by me): Sinus rhythm with PACs.  Old anterior wall MI.  Left axis deviation.  Probable left atrial enlargement.  02/09/2014 ECG (independently read by me):  normal sinus rhythm with sinus arrhythmia at 63 bpm.  One isolated PVC.  10/06/2013 (independently read by me): Sinus rhythm at 56 beats per minute with occasional isolated PVC.  06/01/2013 ECG (independently read by me): Sinus bradycardia at 49 beats per minute.  No ectopy.  QTc interval 45 ms.  Prior ECG (independently read by me): Sinus bradycardia 52 beats per minute. Old anterior lateral myocardial infarction. Nonspecific interventricular conduction delay. Previously noted T-wave changes.  ECG from 08/28/2012: Sinus bradycardia 52 beats per minute. No ectopy. Diffuse T wave abnormality the 4 through V6 1 and L. Poor R-wave progression compatible with prior anterior wall MI.  LABS:  BMET  BMP Latest Ref Rng 06/01/2013 04/21/2013 02/17/2013  Glucose 70 - 99 mg/dL 93 84 89  BUN 6 - 23 mg/dL 9 14 14   Creatinine 0.50 - 1.35 mg/dL 0.91 0.75 0.84  Sodium 135 - 145 mEq/L 137 135(L) 137  Potassium 3.5 - 5.3 mEq/L 4.0 4.3 4.1  Chloride 96 - 112 mEq/L 103 98 104  CO2 19 - 32 mEq/L 25 25 28   Calcium 8.4 - 10.5 mg/dL 9.1 9.1 9.2     Hepatic Function Panel     Component Value Date/Time   PROT 6.4 06/01/2013 0813   ALBUMIN 4.0 06/01/2013 0813   AST 17 06/01/2013 0813   ALT 14 06/01/2013 0813   ALKPHOS 45 06/01/2013 0813   BILITOT 0.6 06/01/2013 0813   BILIDIR 0.2 11/07/2012 1509   Hepatic Function Latest Ref Rng 06/01/2013 02/17/2013 11/07/2012  Total Protein 6.0 - 8.3 g/dL 6.4 6.9 7.4  Albumin 3.5 - 5.2 g/dL 4.0 4.1 4.1  AST 0 - 37 U/L 17 18 21   ALT 0 - 53 U/L 14 16 18   Alk Phosphatase 39 - 117 U/L 45 45 49  Total Bilirubin 0.2 - 1.2 mg/dL 0.6 0.7 0.9  Bilirubin, Direct 0.0 - 0.3 mg/dL - - 0.2     CBC  CBC Latest Ref Rng 06/01/2013 04/21/2013 02/17/2013  WBC 4.0 - 10.5 K/uL 7.7 10.5 7.9  Hemoglobin 13.0 -  17.0 g/dL 14.3 15.6 14.8  Hematocrit 39.0 - 52.0 % 42.7 46.0 43.9  Platelets 150 - 400 K/uL 303 228 253     BNP No  results found for: PROBNP    Lipid Panel     Component Value Date/Time   CHOL 111 06/01/2013 0813   TRIG 104 06/01/2013 0813   HDL 29* 06/01/2013 0813   CHOLHDL 3.8 06/01/2013 0813   VLDL 21 06/01/2013 0813   LDLCALC 61 06/01/2013 0813    RADIOLOGY: No results found.    ASSESSMENT AND PLAN:  Jason. Modena [name] suffered a large anterior wall myocardial infarction and has ischemic cardiomyopathy with an ejection fraction in the 25-35% range. His last nuclear perfusion study in November 2013 was nonischemic.   He has had issues with paroxysmal atrial fibrillation.  This did improve with resumption of amiodarone but he had been taken off this by Dr. [name].  Since he was last seen, his carvedilol dose was further increased to 18.75 mg twice a day due to some recurrent paroxysmal events of AF.  He seems to tolerate this increased dose.  He still notes some rare occurrences of AF, which can last for several hours.  He is on warfarin anticoagulation and his INR today is 3.5.  Coumadin dose was adjusted.  His recent optivall assessment revealed mild elevation intermittently over the past month.  I have recommended further titration of his Spironolactone to 12.5 mg twice a day.  I am scheduling him for follow-up echo Doppler study for further evaluation.  Follow-up laboratory will be obtained in 2 weeks.  I will see him in several months for reevaluation or sooner if problems arise.   Time spent: 25 minutes   Troy Sine, MD, [facility]  04/08/2014 6:38 PM

## 2014-04-12 DIAGNOSIS — J3089 Other allergic rhinitis: Secondary | ICD-10-CM | POA: Diagnosis not present

## 2014-04-14 ENCOUNTER — Ambulatory Visit (HOSPITAL_COMMUNITY)
Admission: RE | Admit: 2014-04-14 | Discharge: 2014-04-14 | Disposition: A | Discharge status: Home / Self Care | Payer: Medicare Other | Source: Ambulatory Visit | Attending: Internal Medicine | Admitting: Internal Medicine

## 2014-04-14 DIAGNOSIS — I2510 Atherosclerotic heart disease of native coronary artery without angina pectoris: Secondary | ICD-10-CM | POA: Diagnosis not present

## 2014-04-14 DIAGNOSIS — I2583 Coronary atherosclerosis due to lipid rich plaque: Secondary | ICD-10-CM

## 2014-04-14 DIAGNOSIS — I4891 Unspecified atrial fibrillation: Secondary | ICD-10-CM | POA: Diagnosis not present

## 2014-04-14 DIAGNOSIS — I480 Paroxysmal atrial fibrillation: Secondary | ICD-10-CM | POA: Diagnosis not present

## 2014-04-14 NOTE — Progress Notes (Signed)
2D Echocardiogram Complete.  04/14/2014   Deliah Boston, RDCS   Mr. Levinson refused the use of Definity contrast at this time.

## 2014-04-19 ENCOUNTER — Ambulatory Visit (INDEPENDENT_AMBULATORY_CARE_PROVIDER_SITE_OTHER): Payer: Medicare Other | Admitting: Pharmacist Clinician (PhC)/ Clinical Pharmacy Specialist

## 2014-04-19 VITALS — BP 118/72 | HR 64

## 2014-04-19 DIAGNOSIS — I4891 Unspecified atrial fibrillation: Secondary | ICD-10-CM | POA: Diagnosis not present

## 2014-04-19 DIAGNOSIS — I480 Paroxysmal atrial fibrillation: Secondary | ICD-10-CM

## 2014-04-19 DIAGNOSIS — Z7901 Long term (current) use of anticoagulants: Secondary | ICD-10-CM

## 2014-04-19 DIAGNOSIS — Z79899 Other long term (current) drug therapy: Secondary | ICD-10-CM | POA: Diagnosis not present

## 2014-04-19 LAB — POCT INR: INR: 1.8

## 2014-04-20 DIAGNOSIS — J3089 Other allergic rhinitis: Secondary | ICD-10-CM | POA: Diagnosis not present

## 2014-04-20 LAB — BASIC METABOLIC PANEL
BUN: 13 mg/dL (ref 6–23)
CO2: 29 mEq/L (ref 19–32)
CREATININE: 0.86 mg/dL (ref 0.50–1.35)
Calcium: 9.6 mg/dL (ref 8.4–10.5)
Chloride: 102 mEq/L (ref 96–112)
Glucose, Bld: 66 mg/dL — ABNORMAL LOW (ref 70–99)
Potassium: 4.6 mEq/L (ref 3.5–5.3)
SODIUM: 137 meq/L (ref 135–145)

## 2014-04-22 ENCOUNTER — Encounter: Payer: Self-pay | Admitting: *Deleted

## 2014-04-22 ENCOUNTER — Telehealth: Payer: Self-pay | Admitting: Cardiovascular Disease

## 2014-04-22 NOTE — Telephone Encounter (Signed)
Labwork results reviewed. Advised pt results would be mailed per Wanda's note. He voiced understanding.

## 2014-04-22 NOTE — Telephone Encounter (Signed)
Pt wants lab results from Monday please.

## 2014-04-26 ENCOUNTER — Telehealth: Payer: Self-pay | Admitting: Cardiology

## 2014-04-26 ENCOUNTER — Ambulatory Visit (INDEPENDENT_AMBULATORY_CARE_PROVIDER_SITE_OTHER): Payer: Medicare Other | Admitting: *Deleted

## 2014-04-26 DIAGNOSIS — Z95810 Presence of automatic (implantable) cardiac defibrillator: Secondary | ICD-10-CM

## 2014-04-26 DIAGNOSIS — I5022 Chronic systolic (congestive) heart failure: Secondary | ICD-10-CM

## 2014-04-26 NOTE — Telephone Encounter (Signed)
Spoke with pt and reminded pt of remote transmission that is due today. Pt verbalized understanding.   

## 2014-04-27 ENCOUNTER — Encounter: Payer: Self-pay | Admitting: *Deleted

## 2014-04-28 ENCOUNTER — Encounter: Payer: Self-pay | Admitting: *Deleted

## 2014-04-28 ENCOUNTER — Encounter: Payer: Self-pay | Admitting: Cardiovascular Disease

## 2014-04-28 NOTE — Progress Notes (Signed)
EPIC Encounter for ICM Monitoring  Patient Name: [name] is a 74 y.o. male Date: 04/28/2014 Primary Care Physican: [name], MD Primary Cardiologist: [name] Electrophysiologist: [name] Dry Weight: 157 lbs       In the past month, have you:  1. Gained more than 2 pounds in a day or more than 5 pounds in a week? no  2. Had changes in your medications (with verification of current medications)? no  3. Had more shortness of breath than is usual for you? no  4. Limited your activity because of shortness of breath? no  5. Not been able to sleep because of shortness of breath? no  6. Had increased swelling in your feet or ankles? no  7. Had symptoms of dehydration (dizziness, dry mouth, increased thirst, decreased urine output) no  8. Had changes in sodium restriction? no  9. Been compliant with medication? Yes   ICM trend:   Follow-up plan: ICM clinic phone appointment: 05/31/14. The patient is doing well this month per his report. He did have one episode of a-fib that started yesterday morning about 1:00 am and lasted until about 10:00 am. He inquired if he could take an extra 6.25 mg of coreg if a-fib re-occurs. I have advised that he may try this if he feels his a-fib is not breaking. He voices understanding. The patient inquired about his echo results from [name]. Results reviewed with the patient. His EF is at 20-25%, which he had been 25-30% in December 2014.   Copy of note sent to patient's primary care physician, primary cardiologist, and device following physician.  Alvis Lemmings, RN, BSN 04/28/2014 1:00 PM

## 2014-04-29 ENCOUNTER — Telehealth: Payer: Self-pay | Admitting: *Deleted

## 2014-04-29 DIAGNOSIS — J3089 Other allergic rhinitis: Secondary | ICD-10-CM | POA: Diagnosis not present

## 2014-04-29 NOTE — Telephone Encounter (Signed)
-----  Message from Inda Castle, MD sent at 04/29/2014  1:25 PM EDT ----- yes ----- Message -----    From: Larina Bras, CMA    Sent: 04/29/2014  10:14 AM      To: Inda Castle, MD  Dr Deatra Ina-  Please see note below.... Does patient need appointment with you for follow up? ----- Message -----    From: Jerene Bears, MD    Sent: 04/29/2014   9:56 AM      To: Larina Bras, CMA  Would now be Dr. [name] patient I would assume I do not think I have ever met him I do not see where he specifically requested me  ----- Message -----    From: Larina Bras, CMA    Sent: 04/27/2014   3:28 PM      To: Jerene Bears, MD  Dr Hilarie Fredrickson- Patient is on your schedule for 05/05/14 (was a [facility] patient) for IBS. However, it appears he saw Janett Billow on  04/08/14 and Dr Deatra Ina signed off on the note.Marland KitchenMarland Kitchen 1) does patient need another appointment with GI now 2) is this even your patient? (or should it go to [place] since he signed off?)

## 2014-04-29 NOTE — Telephone Encounter (Signed)
I have spoken to patient to advise that he is actually Dr [name] patient [name] saw him 10/22/08 and also signed off on [name]'s note from 04/08/14). Patient states that he does not remember Dr [name] but would like to see a doctor. He is scheduled for Dr [name] next available on 06/21/14 @ 3:00 pm. I will also keep in mind that he would like a sooner appointment if one comes available.

## 2014-05-05 ENCOUNTER — Ambulatory Visit: Payer: Medicare Other | Admitting: Internal Medicine

## 2014-05-05 DIAGNOSIS — J3089 Other allergic rhinitis: Secondary | ICD-10-CM | POA: Diagnosis not present

## 2014-05-10 ENCOUNTER — Ambulatory Visit (INDEPENDENT_AMBULATORY_CARE_PROVIDER_SITE_OTHER): Payer: Medicare Other | Admitting: Pharmacist Clinician (PhC)/ Clinical Pharmacy Specialist

## 2014-05-10 DIAGNOSIS — Z7901 Long term (current) use of anticoagulants: Secondary | ICD-10-CM

## 2014-05-10 DIAGNOSIS — L300 Nummular dermatitis: Secondary | ICD-10-CM | POA: Diagnosis not present

## 2014-05-10 DIAGNOSIS — I4891 Unspecified atrial fibrillation: Secondary | ICD-10-CM

## 2014-05-10 DIAGNOSIS — J3089 Other allergic rhinitis: Secondary | ICD-10-CM | POA: Diagnosis not present

## 2014-05-10 DIAGNOSIS — I480 Paroxysmal atrial fibrillation: Secondary | ICD-10-CM | POA: Diagnosis not present

## 2014-05-10 LAB — POCT INR: INR: 1.8

## 2014-05-11 ENCOUNTER — Other Ambulatory Visit: Payer: Self-pay | Admitting: Cardiovascular Disease

## 2014-05-11 NOTE — Telephone Encounter (Signed)
Rx has been sent to the pharmacy electronically. ° °

## 2014-05-12 DIAGNOSIS — J3089 Other allergic rhinitis: Secondary | ICD-10-CM | POA: Diagnosis not present

## 2014-05-17 ENCOUNTER — Telehealth: Payer: Self-pay | Admitting: Gastroenterology

## 2014-05-17 NOTE — Telephone Encounter (Signed)
Moved appointment to 05/21/14 at 2:30 PM with [name], NP. Patient is taking Bentyl but is still having "abdominal spasms" and is asking for something else until OV on 05/21/14. Please, advise.

## 2014-05-18 ENCOUNTER — Other Ambulatory Visit: Payer: Self-pay

## 2014-05-18 DIAGNOSIS — J3089 Other allergic rhinitis: Secondary | ICD-10-CM | POA: Diagnosis not present

## 2014-05-18 MED ORDER — HYOSCYAMINE SULFATE ER 0.375 MG PO TBCR
1.0000 | EXTENDED_RELEASE_TABLET | Freq: Two times a day (BID) | ORAL | Status: DC | PRN
Start: 2014-05-18 — End: 2014-08-26

## 2014-05-18 NOTE — Telephone Encounter (Signed)
Switch to hyomax 0.375 mg twice a day.  Please schedule CT of the abdomen and pelvis.  DC dicyclomine

## 2014-05-18 NOTE — Telephone Encounter (Signed)
Patient is agreeable to changing the medications. He declines the CT, but will discuss this with Colletta [place]. at his visit.

## 2014-05-19 ENCOUNTER — Telehealth: Payer: Self-pay | Admitting: Gastroenterology

## 2014-05-19 NOTE — Telephone Encounter (Signed)
This patient was denied Hyomax already... By Whom???? Dont know. But the medication was denied Can do another prior auth for 60 days   Dr Deatra Ina Can [name] be sent in for this patient

## 2014-05-19 NOTE — Telephone Encounter (Signed)
Shirlean Mylar, will you take care of this for me?

## 2014-05-20 DIAGNOSIS — J3089 Other allergic rhinitis: Secondary | ICD-10-CM | POA: Diagnosis not present

## 2014-05-20 MED ORDER — GLYCOPYRROLATE 2 MG PO TABS
2.0000 mg | ORAL_TABLET | Freq: Two times a day (BID) | ORAL | Status: DC
Start: 2014-05-20 — End: 2014-08-26

## 2014-05-20 NOTE — Telephone Encounter (Signed)
yes

## 2014-05-20 NOTE — Telephone Encounter (Signed)
Called pt to inform new med sent. He has an appointment with [facility] Friday.

## 2014-05-21 ENCOUNTER — Telehealth: Payer: Self-pay | Admitting: Gastroenterology

## 2014-05-21 ENCOUNTER — Ambulatory Visit (INDEPENDENT_AMBULATORY_CARE_PROVIDER_SITE_OTHER): Payer: Medicare Other | Admitting: Nurse Practitioner

## 2014-05-21 ENCOUNTER — Encounter: Payer: Self-pay | Admitting: Nurse Practitioner

## 2014-05-21 VITALS — BP 108/66 | HR 60 | Ht 67.0 in | Wt 155.1 lb

## 2014-05-21 DIAGNOSIS — R1013 Epigastric pain: Secondary | ICD-10-CM

## 2014-05-21 DIAGNOSIS — I255 Ischemic cardiomyopathy: Secondary | ICD-10-CM | POA: Diagnosis not present

## 2014-05-21 DIAGNOSIS — K648 Other hemorrhoids: Secondary | ICD-10-CM | POA: Diagnosis not present

## 2014-05-21 MED ORDER — HYDROCORTISONE ACETATE 25 MG RE SUPP: 25.0000 mg | Freq: Every day | RECTAL | Status: DC

## 2014-05-21 NOTE — Telephone Encounter (Signed)
Patient was given hyoscyamine for abdominal cramping. States he sees blood on the tissue. Not bleeding freely. Encouraged him to keep the appointment today at 2:30pm.

## 2014-05-21 NOTE — Patient Instructions (Addendum)
Diet and Irritable Bowel Syndrome  No cure has been found for irritable bowel syndrome (IBS). Many options are available to treat the symptoms. Your caregiver will give you the best treatments available for your symptoms. He or she will also encourage you to manage stress and to make changes to your diet. You need to work with your caregiver and Registered Dietician to find the best combination of medicine, diet, counseling, and support to control your symptoms. The following are some diet suggestions. FOODS THAT MAKE IBS WORSE  Fatty foods, such as [place] fries.  Milk products, such as cheese or ice cream.  Chocolate.  Alcohol.  Caffeine (found in coffee and some sodas).  Carbonated drinks, such as soda. If certain foods cause symptoms, you should eat less of them or stop eating them. FOOD JOURNAL   Keep a journal of the foods that seem to cause distress. Write down:  What you are eating during the day and when.  What problems you are having after eating.  When the symptoms occur in relation to your meals.  What foods always make you feel badly.  Take your notes with you to your caregiver to see if you should stop eating certain foods. FOODS THAT MAKE IBS BETTER Fiber reduces IBS symptoms, especially constipation, because it makes stools soft, bulky, and easier to pass. Fiber is found in bran, bread, cereal, beans, fruit, and vegetables. Examples of foods with fiber include:  Apples.  Peaches.  Pears.  Berries.  Figs.  Broccoli, raw.  Cabbage.  Carrots.  Raw peas.  Kidney beans.  Lima beans.  Whole-grain bread.  Whole-grain cereal. Add foods with fiber to your diet a little at a time. This will let your body get used to them. Too much fiber at once might cause gas and swelling of your abdomen. This can trigger symptoms in a person with IBS. Caregivers usually recommend a diet with enough fiber to produce soft, painless bowel movements. High fiber diets may  cause gas and bloating. However, these symptoms often go away within a few weeks, as your body adjusts. In many cases, dietary fiber may lessen IBS symptoms, particularly constipation. However, it may not help pain or diarrhea. High fiber diets keep the colon mildly enlarged (distended) with the added fiber. This may help prevent spasms in the colon. Some forms of fiber also keep water in the stool, thereby preventing hard stools that are difficult to pass.  Besides telling you to eat more foods with fiber, your caregiver may also tell you to get more fiber by taking a fiber pill or drinking water mixed with a special high fiber powder. An example of this is a natural fiber laxative containing psyllium seed.  TIPS  Large meals can cause cramping and diarrhea in people with IBS. If this happens to you, try eating 4 or 5 small meals a day, or try eating less at each of your usual 3 meals. It may also help if your meals are low in fat and high in carbohydrates. Examples of carbohydrates are pasta, rice, whole-grain breads and cereals, fruits, and vegetables.  If dairy products cause your symptoms to flare up, you can try eating less of those foods. You might be able to handle yogurt better than other dairy products, because it contains bacteria that helps with digestion. Dairy products are an important source of calcium and other nutrients. If you need to avoid dairy products, be sure to talk with a Registered Dietitian about getting these nutrients  through other food sources.  Drink enough water and fluids to keep your urine clear or pale yellow. This is important, especially if you have diarrhea. FOR MORE INFORMATION  International Foundation for Functional Gastrointestinal Disorders: www.iffgd.org  National Digestive Diseases Information Clearinghouse: digestive.AmenCredit.is Document Released: 04/07/2003 Document Revised: 04/09/2011 Document Reviewed: 04/17/2013 [facility] Patient Information 2015  [place], [place]. This information is not intended to replace advice given to you by your health care provider. Make sure you discuss any questions you have with your health care provider.   We have sent the following medications to your pharmacy for you to pick up at your convenience: Anusol at bedtime for 10 days  Please call our office back with an update on your condition in 7-10 days

## 2014-05-23 NOTE — Progress Notes (Signed)
     History of Present Illness:  Patient is a 74 year old male. He has CAD / CABG , atrial fibrillation and ischemic cardiomyopathy. Patient previously followed by Dr. Sharlett Iles for functional dyspepsia / IBS. He has chronic, intermittent epigastric pain.  Bentyl stopped working so we recently switched him to [employer] which insurance denied. We then called in Robinul Forte. Patient took one dose and noted some blood in his stool. No constipation. He is on chronic Coumadin, INR on the eleventh of this month was actually subtherapeutic at 1.8  Current Medications, Allergies, Past Medical History, Past Surgical History, Family History and Social History were reviewed in [employer] record.  Physical Exam: General: Pleasant, well developed , male in no acute distress Head: Normocephalic and atraumatic Eyes:  sclerae anicteric, conjunctiva pink  Ears: Normal auditory acuity Lungs: Clear throughout to auscultation Heart: Regular rate and rhythm Abdomen: Soft, non distended, non-tender. No masses, no hepatomegaly. Normal bowel sounds Rectal: external hemorrhoids, internal hemorrhoids on anoscopy Musculoskeletal: Symmetrical with no gross deformities  Extremities: No edema  Neurological: Alert oriented x 4, grossly nonfocal Psychological:  Alert and cooperative. Normal mood and affect  Assessment and Recommendations:   74 year old male with one episode of painless hematochezia this a.m. after taking a bowel antispasmodic.  Explained to patient that rectal bleeding most likely not related to the one dose of Robinul forte, especially since he was not constipated. Patient has hemorrhoids on exam and this is likely the source of bleeding.  Of note patient has wanted limited testing done for chronic GI symptoms. He had negative IFOBs in 2012, 2013 and 2014.  According to our most recent office note March 2016, patient also had a negative Cologuard in Feb 2015. Will treat with  topical steroids, patient will call us with a condition update in 7-10 days.If not improving he may be a candidate for hemorrhoidal banding.

## 2014-05-24 ENCOUNTER — Telehealth: Payer: Self-pay | Admitting: *Deleted

## 2014-05-24 DIAGNOSIS — K648 Other hemorrhoids: Secondary | ICD-10-CM | POA: Insufficient documentation

## 2014-05-24 NOTE — Telephone Encounter (Signed)
Called and spoke with patient to ask him if he has had his magnesium rechecked. He states that Dr. Kenton Kingfisher started him on 400 mg of magnesium daily, however he doesn't remember if he has had it rechecked. He wasn't at home at the time. He will check once he gets home and let me know.

## 2014-05-24 NOTE — Telephone Encounter (Signed)
-----   Message from Troy Sine, MD sent at 05/17/2014  7:21 PM EDT ----- Labs ok x Mg is significantly low ? If accurate;  Re check Mg

## 2014-05-24 NOTE — Progress Notes (Signed)
Reviewed and agree with management. Ulises Wolfinger D. Talea Manges, M.D., FACG  

## 2014-05-25 ENCOUNTER — Telehealth: Payer: Self-pay | Admitting: Cardiovascular Disease

## 2014-05-25 ENCOUNTER — Telehealth: Payer: Self-pay | Admitting: Gastroenterology

## 2014-05-25 DIAGNOSIS — J019 Acute sinusitis, unspecified: Secondary | ICD-10-CM | POA: Diagnosis not present

## 2014-05-25 DIAGNOSIS — Z79899 Other long term (current) drug therapy: Secondary | ICD-10-CM | POA: Diagnosis not present

## 2014-05-25 DIAGNOSIS — H66003 Acute suppurative otitis media without spontaneous rupture of ear drum, bilateral: Secondary | ICD-10-CM | POA: Diagnosis not present

## 2014-05-25 DIAGNOSIS — J4540 Moderate persistent asthma, uncomplicated: Secondary | ICD-10-CM | POA: Diagnosis not present

## 2014-05-25 DIAGNOSIS — J3089 Other allergic rhinitis: Secondary | ICD-10-CM | POA: Diagnosis not present

## 2014-05-25 NOTE — Telephone Encounter (Signed)
Pt need a lab order for magnesium check,he thinks it is low. Please leave it with [name] will get it tomorrow.

## 2014-05-25 NOTE — Telephone Encounter (Signed)
Feedback from the patient- His rectal bleeding has stopped. He will finish the course of steroid suppositories as directed. Robinul is soothing his stomach. He will continue that as directed. He has a follow up appointment with Dr Deatra Ina.

## 2014-05-25 NOTE — Telephone Encounter (Signed)
Order placed. Copy of lab put on Kristin's desk to give to patient on Wednesday

## 2014-05-26 DIAGNOSIS — J310 Chronic rhinitis: Secondary | ICD-10-CM | POA: Diagnosis not present

## 2014-05-26 NOTE — Telephone Encounter (Signed)
Good news. Thanks

## 2014-05-27 ENCOUNTER — Ambulatory Visit (INDEPENDENT_AMBULATORY_CARE_PROVIDER_SITE_OTHER): Payer: Medicare Other | Admitting: Pharmacist Clinician (PhC)/ Clinical Pharmacy Specialist

## 2014-05-27 ENCOUNTER — Ambulatory Visit: Payer: Medicare Other | Admitting: Physician Assistant

## 2014-05-27 DIAGNOSIS — I4891 Unspecified atrial fibrillation: Secondary | ICD-10-CM

## 2014-05-27 DIAGNOSIS — Z7901 Long term (current) use of anticoagulants: Secondary | ICD-10-CM | POA: Diagnosis not present

## 2014-05-27 DIAGNOSIS — I480 Paroxysmal atrial fibrillation: Secondary | ICD-10-CM | POA: Diagnosis not present

## 2014-05-27 LAB — MAGNESIUM: Magnesium: 2.3 mg/dL (ref 1.5–2.5)

## 2014-05-27 LAB — POCT INR: INR: 2

## 2014-05-31 ENCOUNTER — Ambulatory Visit (INDEPENDENT_AMBULATORY_CARE_PROVIDER_SITE_OTHER): Payer: Medicare Other | Admitting: *Deleted

## 2014-05-31 ENCOUNTER — Inpatient Hospital Stay (HOSPITAL_COMMUNITY)
Admission: EM | Admit: 2014-05-31 | Discharge: 2014-06-04 | DRG: 309 | Disposition: A | Discharge status: Home / Self Care | Payer: Medicare Other | Attending: Cardiovascular Disease | Admitting: Cardiovascular Disease

## 2014-05-31 ENCOUNTER — Encounter: Payer: Self-pay | Admitting: Internal Medicine

## 2014-05-31 ENCOUNTER — Ambulatory Visit: Payer: Medicare Other | Admitting: Physician Assistant

## 2014-05-31 ENCOUNTER — Encounter (HOSPITAL_COMMUNITY): Payer: Self-pay | Admitting: Emergency Medicine

## 2014-05-31 ENCOUNTER — Telehealth: Payer: Self-pay | Admitting: Cardiovascular Disease

## 2014-05-31 ENCOUNTER — Encounter: Payer: Self-pay | Admitting: *Deleted

## 2014-05-31 ENCOUNTER — Emergency Department (HOSPITAL_COMMUNITY): Payer: Medicare Other

## 2014-05-31 DIAGNOSIS — Z820 Family history of epilepsy and other diseases of the nervous system: Secondary | ICD-10-CM

## 2014-05-31 DIAGNOSIS — K219 Gastro-esophageal reflux disease without esophagitis: Secondary | ICD-10-CM | POA: Diagnosis present

## 2014-05-31 DIAGNOSIS — I2510 Atherosclerotic heart disease of native coronary artery without angina pectoris: Secondary | ICD-10-CM | POA: Diagnosis present

## 2014-05-31 DIAGNOSIS — I472 Ventricular tachycardia, unspecified: Secondary | ICD-10-CM

## 2014-05-31 DIAGNOSIS — I255 Ischemic cardiomyopathy: Secondary | ICD-10-CM | POA: Diagnosis present

## 2014-05-31 DIAGNOSIS — I4729 Other ventricular tachycardia: Secondary | ICD-10-CM

## 2014-05-31 DIAGNOSIS — Z95810 Presence of automatic (implantable) cardiac defibrillator: Secondary | ICD-10-CM

## 2014-05-31 DIAGNOSIS — Z8249 Family history of ischemic heart disease and other diseases of the circulatory system: Secondary | ICD-10-CM | POA: Diagnosis not present

## 2014-05-31 DIAGNOSIS — Z7901 Long term (current) use of anticoagulants: Secondary | ICD-10-CM

## 2014-05-31 DIAGNOSIS — I248 Other forms of acute ischemic heart disease: Secondary | ICD-10-CM | POA: Diagnosis present

## 2014-05-31 DIAGNOSIS — I4892 Unspecified atrial flutter: Secondary | ICD-10-CM | POA: Diagnosis present

## 2014-05-31 DIAGNOSIS — Z951 Presence of aortocoronary bypass graft: Secondary | ICD-10-CM | POA: Diagnosis not present

## 2014-05-31 DIAGNOSIS — Z823 Family history of stroke: Secondary | ICD-10-CM | POA: Diagnosis not present

## 2014-05-31 DIAGNOSIS — I5022 Chronic systolic (congestive) heart failure: Secondary | ICD-10-CM | POA: Diagnosis not present

## 2014-05-31 DIAGNOSIS — Z880 Allergy status to penicillin: Secondary | ICD-10-CM

## 2014-05-31 DIAGNOSIS — I959 Hypotension, unspecified: Secondary | ICD-10-CM | POA: Diagnosis not present

## 2014-05-31 DIAGNOSIS — Z7982 Long term (current) use of aspirin: Secondary | ICD-10-CM | POA: Diagnosis not present

## 2014-05-31 DIAGNOSIS — R0789 Other chest pain: Secondary | ICD-10-CM | POA: Diagnosis not present

## 2014-05-31 DIAGNOSIS — F419 Anxiety disorder, unspecified: Secondary | ICD-10-CM | POA: Diagnosis present

## 2014-05-31 DIAGNOSIS — I252 Old myocardial infarction: Secondary | ICD-10-CM | POA: Diagnosis not present

## 2014-05-31 DIAGNOSIS — R079 Chest pain, unspecified: Secondary | ICD-10-CM | POA: Diagnosis not present

## 2014-05-31 DIAGNOSIS — Z79899 Other long term (current) drug therapy: Secondary | ICD-10-CM

## 2014-05-31 DIAGNOSIS — I480 Paroxysmal atrial fibrillation: Secondary | ICD-10-CM | POA: Diagnosis present

## 2014-05-31 DIAGNOSIS — I4891 Unspecified atrial fibrillation: Secondary | ICD-10-CM | POA: Diagnosis not present

## 2014-05-31 DIAGNOSIS — I5021 Acute systolic (congestive) heart failure: Secondary | ICD-10-CM | POA: Diagnosis not present

## 2014-05-31 DIAGNOSIS — Z881 Allergy status to other antibiotic agents status: Secondary | ICD-10-CM

## 2014-05-31 LAB — BASIC METABOLIC PANEL
Anion gap: 10 (ref 5–15)
BUN: 14 mg/dL (ref 6–20)
CALCIUM: 9.3 mg/dL (ref 8.9–10.3)
CHLORIDE: 103 mmol/L (ref 101–111)
CO2: 21 mmol/L — AB (ref 22–32)
Creatinine, Ser: 0.91 mg/dL (ref 0.61–1.24)
GFR calc Af Amer: >60 mL/min (ref >60)
GFR calc non Af Amer: >60 mL/min (ref >60)
Glucose, Bld: 93 mg/dL (ref 70–99)
Potassium: 4.4 mmol/L (ref 3.5–5.1)
SODIUM: 134 mmol/L — AB (ref 135–145)

## 2014-05-31 LAB — CBC
HCT: 45.9 % (ref 39.0–52.0)
Hemoglobin: 15.5 g/dL (ref 13.0–17.0)
MCH: 28.6 pg (ref 26.0–34.0)
MCHC: 33.8 g/dL (ref 30.0–36.0)
MCV: 84.7 fL (ref 78.0–100.0)
PLATELETS: 231 10*3/uL (ref 150–400)
RBC: 5.42 MIL/uL (ref 4.22–5.81)
RDW: 14.5 % (ref 11.5–15.5)
WBC: 12 10*3/uL — ABNORMAL HIGH (ref 4.0–10.5)

## 2014-05-31 LAB — BRAIN NATRIURETIC PEPTIDE: B NATRIURETIC PEPTIDE 5: 648.3 pg/mL — AB (ref 0.0–100.0)

## 2014-05-31 LAB — I-STAT TROPONIN, ED: TROPONIN I, POC: 0 ng/mL (ref 0.00–0.08)

## 2014-05-31 MED ORDER — ACETAMINOPHEN 325 MG PO TABS
650.0000 mg | ORAL_TABLET | ORAL | Status: DC | PRN
  Administered 2014-06-02 – 2014-06-03 (×2): 650 mg via ORAL
  Filled 2014-05-31 (×2): qty 2

## 2014-05-31 MED ORDER — ISOSORBIDE MONONITRATE ER 30 MG PO TB24
30.0000 mg | ORAL_TABLET | Freq: Every day | ORAL | Status: DC
Start: 2014-06-01 — End: 2014-06-04
  Administered 2014-06-01 – 2014-06-04 (×4): 30 mg via ORAL
  Filled 2014-05-31 (×5): qty 1

## 2014-05-31 MED ORDER — ONDANSETRON HCL 4 MG/2ML IJ SOLN: 4.0000 mg | Freq: Four times a day (QID) | INTRAMUSCULAR | Status: DC | PRN

## 2014-05-31 MED ORDER — MONTELUKAST SODIUM 10 MG PO TABS
10.0000 mg | ORAL_TABLET | Freq: Every day | ORAL | Status: DC
  Administered 2014-06-01 – 2014-06-03 (×3): 10 mg via ORAL
  Filled 2014-05-31 (×4): qty 1

## 2014-05-31 MED ORDER — WARFARIN SODIUM 5 MG PO TABS: 5.0000 mg | ORAL_TABLET | Freq: Every day | ORAL | Status: DC

## 2014-05-31 MED ORDER — LISINOPRIL 10 MG PO TABS
10.0000 mg | ORAL_TABLET | Freq: Two times a day (BID) | ORAL | Status: DC
  Administered 2014-06-01 – 2014-06-04 (×6): 10 mg via ORAL
  Filled 2014-05-31 (×8): qty 1

## 2014-05-31 MED ORDER — METOPROLOL TARTRATE 25 MG PO TABS
25.0000 mg | ORAL_TABLET | Freq: Two times a day (BID) | ORAL | Status: DC
  Administered 2014-06-01: 25 mg via ORAL
  Filled 2014-05-31 (×2): qty 1

## 2014-05-31 MED ORDER — SIMVASTATIN 40 MG PO TABS
40.0000 mg | ORAL_TABLET | Freq: Every day | ORAL | Status: DC
  Administered 2014-06-01 – 2014-06-03 (×3): 40 mg via ORAL
  Filled 2014-05-31 (×4): qty 1

## 2014-05-31 MED ORDER — PANTOPRAZOLE SODIUM 40 MG PO TBEC
40.0000 mg | DELAYED_RELEASE_TABLET | Freq: Every day | ORAL | Status: DC
  Administered 2014-06-01 – 2014-06-04 (×4): 40 mg via ORAL
  Filled 2014-05-31 (×5): qty 1

## 2014-05-31 MED ORDER — SPIRONOLACTONE 12.5 MG HALF TABLET
12.5000 mg | ORAL_TABLET | Freq: Two times a day (BID) | ORAL | Status: DC
  Administered 2014-06-01 – 2014-06-03 (×6): 12.5 mg via ORAL
  Filled 2014-05-31 (×9): qty 1

## 2014-05-31 MED ORDER — ALLOPURINOL 150 MG HALF TABLET
150.0000 mg | ORAL_TABLET | Freq: Every day | ORAL | Status: DC
Start: 2014-06-01 — End: 2014-06-04
  Administered 2014-06-01 – 2014-06-04 (×4): 150 mg via ORAL
  Filled 2014-05-31 (×4): qty 1

## 2014-05-31 NOTE — Addendum Note (Signed)
Addended by: Alvis Lemmings C on: 05/31/2014 05:31 PM   Modules accepted: Orders, Medications, Level of Service

## 2014-05-31 NOTE — ED Notes (Signed)
Pt. reports palpitations onset last night and upper chest pain this evening with mild SOB and dizziness , his cardiologist is Dr. Lequita Halt and [name]. Denies nausea or diaphoresis .

## 2014-05-31 NOTE — ED Provider Notes (Signed)
CSN: [id number]     Arrival date & time 05/31/14  77 History   First MD Initiated Contact with Patient 05/31/14 1941     Chief Complaint  Patient presents with  . Chest Pain  . Palpitations     (Consider location/radiation/quality/duration/timing/severity/associated sxs/prior Treatment) HPI  Pt presenting with c/o chest pressure. He has hx of afib and ischemic cardiomyopathy.  He has been having episodes of rapid afib over the past several days, today HR has been running high.  Had a phone appointment with cardiology clinic and was advised to increase his dose of coreg- he took an extra dose earlier today of 6.25mg .  After the phone appointment he developed chest pressure.  He was seated at the time.  Pressure was located in the midsternal region.  Denies shortness of breath, no fainting.  No leg swelling.  No fever/chills.  Chest pressure resolved prior to arriving in the ED.  There are no other associated systemic symptoms, there are no other alleviating or modifying factors.   Past Medical History  Diagnosis Date  . Coronary artery disease     Hx MI 1992, CABG 1998 , Nuc study 11.2013 large scar but no ischemia  . 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 Atrial fib/flutter, transient   . GERD (gastroesophageal reflux disease)   . H. pylori infection     Hx of   . Cardiomyopathy, ischemic 2011    with EF 25-35% by echo  . H/O myocardial infarction, greater than 8 weeks 1992    large ant wall injury  . NSVT (nonsustained ventricular tachycardia)    Past Surgical History  Procedure Laterality Date  . Nasal sinus surgery  05/31/2010    Dr. Benjamine Mola  . Coronary artery bypass graft  1998    x 5  . Cataract extraction, bilateral    . Knee surgery      right  . Hernia repair    . Icd generator change  01/2008    medtronic, hx+ EP study   Family History  Problem Relation Age of Onset  . Colon cancer Neg Hx   . Heart disease Father      questionable  . Stroke Mother   . Heart failure Sister   . Healthy Brother   . Healthy Sister   . Parkinsonism Brother    History  Substance Use Topics  . Smoking status: Never Smoker   . Smokeless tobacco: Never Used  . Alcohol Use: 0.0 oz/week    0 Standard drinks or equivalent per week     Comment: socially    Review of Systems  ROS reviewed and all otherwise negative except for mentioned in HPI    Allergies  Amoxicillin and Penicillins  Home Medications   Prior to Admission medications   Medication Sig Start Date End Date Taking? Authorizing Provider  acetaminophen (TYLENOL) 650 MG CR tablet Take 650 mg by mouth 2 (two) times daily.    Yes Historical Provider, MD  allopurinol (ZYLOPRIM) 300 MG tablet Take 150 mg by mouth daily.  08/16/10  Yes Historical Provider, MD  carvedilol (COREG) 12.5 MG tablet Take 1 tablet (12.5 mg total) by mouth 2 (two) times daily. Patient taking differently: Take 12.5 mg by mouth. Take with 6.25 mg for a total of 18.75 mg 03/12/14  Yes Troy Sine, MD  carvedilol (COREG) 6.25 MG tablet Take 1 tablet (6.25 mg total) by mouth 2 (two) times daily. Patient  taking differently: Take 6.25 mg by mouth daily. Take with 12.5mg  for a total of 18.75 mg 03/12/14  Yes Troy Sine, MD  cetirizine (ZYRTEC) 10 MG tablet Take 10 mg by mouth daily.     Yes Historical Provider, MD  Cholecalciferol (VITAMIN D) 2000 UNITS CAPS Take 1 capsule by mouth daily.   Yes Historical Provider, MD  esomeprazole (NEXIUM) 40 MG capsule Take 1 capsule (40 mg total) by mouth daily. 04/08/14  Yes [name] D Zehr, PA-C  glycopyrrolate (ROBINUL) 2 MG tablet Take 1 tablet (2 mg total) by mouth 2 (two) times daily. 05/20/14  Yes Inda Castle, MD  isosorbide mononitrate (IMDUR) 30 MG 24 hr tablet TAKE ONE-HALF TO ONE TABLET BY MOUTH ONCE DAILY 05/11/14  Yes Troy Sine, MD  lisinopril (PRINIVIL,ZESTRIL) 20 MG tablet Take 0.5 tablets (10 mg total) by mouth 2 (two) times daily.  03/10/14  Yes Troy Sine, MD  magnesium oxide (MAG-OX) 400 MG tablet Take 400 mg by mouth daily.   Yes Historical Provider, MD  mometasone (ASMANEX) 220 MCG/INH inhaler Inhale 1 puff into the lungs daily.    Yes Historical Provider, MD  mometasone (NASONEX) 50 MCG/ACT nasal spray Place 2 sprays into the nose daily as needed.    Yes Historical Provider, MD  montelukast (SINGULAIR) 10 MG tablet Take 1 tablet (10 mg total) by mouth at bedtime. 04/29/13  Yes Luke K Kilroy, PA-C  nitroGLYCERIN (NITROSTAT) 0.4 MG SL tablet Place 0.4 mg under the tongue every 5 (five) minutes as needed for chest pain.   Yes Historical Provider, MD  silodosin (RAPAFLO) 4 MG CAPS capsule Take 8 mg by mouth. Every 2 days   Yes Historical Provider, MD  simvastatin (ZOCOR) 40 MG tablet Take 1 tablet (40 mg total) by mouth at bedtime. 04/29/13  Yes Luke K Kilroy, PA-C  spironolactone (ALDACTONE) 25 MG tablet Take 0.5 tablet twice daily. 04/05/14  Yes Troy Sine, MD  warfarin (COUMADIN) 5 MG tablet Take 1 tablet by mouth daily or as directed by coumadin clinic Patient taking differently: Take 2.5-5 mg by mouth daily. Take 5mg  on Mon /Wed / Fri / Sat Take 2.5mg  on Sun / Tues / Thur 12/09/13  Yes Kristin L Alvstad, RPH-CPP  hydrocortisone (ANUSOL-HC) 25 MG suppository Place 1 suppository (25 mg total) rectally at bedtime. For 10 days 05/21/14   Willia Craze, NP  Hyoscyamine Sulfate 0.375 MG TBCR Take 1 tablet (0.375 mg total) by mouth 2 (two) times daily as needed. 05/18/14   Inda Castle, MD   BP 107/66 mmHg  Pulse 58  Temp(Src) 97.5 F (36.4 C) (Oral)  Resp 14  Ht 5\' 7"  (1.702 m)  Wt 155 lb 7 oz (70.506 kg)  BMI 24.34 kg/m2  SpO2 97%  Vitals reviewed Physical Exam  Physical Examination: General appearance - alert, well appearing, and in no distress Mental status - alert, oriented to person, place, and time Eyes - no conjunctival injection, no scleral icterus Mouth - mucous membranes moist, pharynx normal without  lesions Chest - clear to auscultation, no wheezes, rales or rhonchi, symmetric air entry Heart - normal rate, regular rhythm, normal S1, S2, no murmurs, rubs, clicks or gallops Abdomen - soft, nontender, nondistended, no masses or organomegaly Extremities - peripheral pulses normal, no pedal edema, no clubbing or cyanosis Skin - normal coloration and turgor, no rashes  ED Course  Procedures (including critical care time)  10:12 PM d/w cardiology fellow, he will see patient  in the ED.   Labs Review Labs Reviewed  CBC - Abnormal; Notable for the following:    WBC 12.0 (*)    All other components within normal limits  BASIC METABOLIC PANEL - Abnormal; Notable for the following:    Sodium 134 (*)    CO2 21 (*)    All other components within normal limits  BRAIN NATRIURETIC PEPTIDE - Abnormal; Notable for the following:    B Natriuretic Peptide 648.3 (*)    All other components within normal limits  I-STAT TROPOININ, ED    Imaging Review Dg Chest 2 View  05/31/2014   CLINICAL DATA:  Palpitations, upper chest pain  EXAM: CHEST  2 VIEW  COMPARISON:  Radiograph 04/21/2013  FINDINGS: Left-sided pacemaker overlies normal cardiac silhouette. The cardiac silhouette is upper limits of normal. No effusion, infiltrate, pneumothorax. Degenerative osteophytosis of the thoracic spine.  IMPRESSION: Mild cardiomegaly without acute cardiopulmonary findings.   Electronically Signed   By: Suzy Bouchard M.D.   On: 05/31/2014 21:00     EKG Interpretation   Date/Time:  Monday May 31 2014 19:09:06 EDT Ventricular Rate:  122 PR Interval:    QRS Duration: 130 QT Interval:  340 QTC Calculation: 484 R Axis:   -83 Text Interpretation:  Atrial flutter with variable A-V block Left axis  deviation Non-specific intra-ventricular conduction block Anterolateral  infarct , age undetermined Abnormal ECG Since previous tracing flutter  waves are more prominent Confirmed by Canary Brim  MD, Davieon Stockham [phone]) on   05/31/2014 10:05:55 PM      MDM   Final diagnoses:  Chest pain  rapid atrial fibrillation  Pt with hx of rapid atrial fib, ischemic cardiomyopathy- presenting with chest pressure.  Initial HR on EKG was 122, he was also hypotensive as low as 54/43 at triage.  At the time of my evaluation BP was improved and chest pressure had resolved, HR in the 70s.  D/w cardiology, they have evaluated patient in the ED and plan for admission.     Alfonzo Beers, MD 05/31/14 [id number]

## 2014-05-31 NOTE — Telephone Encounter (Signed)
Pt would like his lab results from last week please.

## 2014-05-31 NOTE — Progress Notes (Signed)
Remote ICD transmission.   

## 2014-05-31 NOTE — Telephone Encounter (Signed)
Called to give results on labs.  Pt voiced understanding. Wanted to know if Magnesium pill could be discontinued.  Pt also stated he had run of A fib for about 4 hrs on Saturday. No symptoms other than sensation of heart racing. This eventually resolved. He states he had another episode this afternoon just about an hour ago.  Advised to take BP, if systolic above 282 he can extra dose of his 6.25mg  carvedilol - informed pt I would defer to [name] for additional recommendations.

## 2014-05-31 NOTE — Progress Notes (Signed)
EPIC Encounter for ICM Monitoring  Patient Name: [name] is a 74 y.o. male Date: 05/31/2014 Primary Care Physican: [name], MD Primary Cardiologist: [name] Electrophysiologist: [name] Dry Weight: 157 lbs       In the past month, have you:  1. Gained more than 2 pounds in a day or more than 5 pounds in a week? no  2. Had changes in your medications (with verification of current medications)? no  3. Had more shortness of breath than is usual for you? no  4. Limited your activity because of shortness of breath? no  5. Not been able to sleep because of shortness of breath? no  6. Had increased swelling in your feet or ankles? no  7. Had symptoms of dehydration (dizziness, dry mouth, increased thirst, decreased urine output) no  8. Had changes in sodium restriction? no  9. Been compliant with medication? Yes   ICM trend:   Follow-up plan: ICM clinic phone appointment: 07/01/14. The patient is doing well from a fluid standpoint. His only complaint today is that he is having more intermittent a-fib. He had an episode Saturday that lasted about 4 hours. His episode today started about 3 AM this morning and has been on-going today. He is currently on coreg 18.75 mg BID. He was on amiodarone in the past and failed this per his report. His a-fib was well controlled over the last few months, but is becoming more problematic for him lately. He did complain of this last month when I spoke with him. Reviewed with Dr. [name] today in the office. Orders received to have the patient increase his coreg to 25 mg in the AM and 18.75 mg in the PM. He will have an amiodarone level drawn this week to ensure this has cleared his system. He will follow up with Roderic Palau, NP in the a-fib clinic next week per Dr. [name]. This is scheduled for 06/08/14.   Copy of note sent to patient's primary care physician, primary cardiologist, and device following physician.  Alvis Lemmings, RN,  BSN 05/31/2014 5:20 PM

## 2014-05-31 NOTE — ED Notes (Signed)
Cardiology at bedside with family

## 2014-06-01 ENCOUNTER — Telehealth: Payer: Self-pay | Admitting: Internal Medicine

## 2014-06-01 DIAGNOSIS — I4891 Unspecified atrial fibrillation: Secondary | ICD-10-CM

## 2014-06-01 DIAGNOSIS — R079 Chest pain, unspecified: Secondary | ICD-10-CM

## 2014-06-01 DIAGNOSIS — I5021 Acute systolic (congestive) heart failure: Secondary | ICD-10-CM

## 2014-06-01 LAB — MAGNESIUM: MAGNESIUM: 2.1 mg/dL (ref 1.7–2.4)

## 2014-06-01 LAB — LIPID PANEL
CHOLESTEROL: 133 mg/dL (ref 0–200)
HDL: 35 mg/dL — AB (ref >40)
LDL Cholesterol: 78 mg/dL (ref 0–99)
TRIGLYCERIDES: 100 mg/dL (ref <150)
Total CHOL/HDL Ratio: 3.8 RATIO
VLDL: 20 mg/dL (ref 0–40)

## 2014-06-01 LAB — CBC
HEMATOCRIT: 45.4 % (ref 39.0–52.0)
HEMOGLOBIN: 14.5 g/dL (ref 13.0–17.0)
MCH: 27.6 pg (ref 26.0–34.0)
MCHC: 31.9 g/dL (ref 30.0–36.0)
MCV: 86.3 fL (ref 78.0–100.0)
Platelets: 232 10*3/uL (ref 150–400)
RBC: 5.26 MIL/uL (ref 4.22–5.81)
RDW: 14.8 % (ref 11.5–15.5)
WBC: 10.4 10*3/uL (ref 4.0–10.5)

## 2014-06-01 LAB — BASIC METABOLIC PANEL
Anion gap: 10 (ref 5–15)
Anion gap: 9 (ref 5–15)
BUN: 11 mg/dL (ref 6–20)
BUN: 19 mg/dL (ref 6–20)
CALCIUM: 9 mg/dL (ref 8.9–10.3)
CHLORIDE: 100 mmol/L — AB (ref 101–111)
CO2: 24 mmol/L (ref 22–32)
CO2: 24 mmol/L (ref 22–32)
CREATININE: 0.93 mg/dL (ref 0.61–1.24)
Calcium: 9.2 mg/dL (ref 8.9–10.3)
Chloride: 102 mmol/L (ref 101–111)
Creatinine, Ser: 1.13 mg/dL (ref 0.61–1.24)
GFR calc Af Amer: >60 mL/min (ref >60)
GFR calc non Af Amer: >60 mL/min (ref >60)
Glucose, Bld: 87 mg/dL (ref 70–99)
Glucose, Bld: 95 mg/dL (ref 70–99)
Potassium: 4.6 mmol/L (ref 3.5–5.1)
Potassium: 4.6 mmol/L (ref 3.5–5.1)
Sodium: 136 mmol/L (ref 135–145)
Sodium: 133 mmol/L — ABNORMAL LOW (ref 135–145)

## 2014-06-01 LAB — PROTIME-INR
INR: 2.54 — AB (ref 0.00–1.49)
Prothrombin Time: 27.6 seconds — ABNORMAL HIGH (ref 11.6–15.2)

## 2014-06-01 LAB — TROPONIN I
TROPONIN I: 0.1 ng/mL — AB (ref <0.031)
Troponin I: 0.09 ng/mL — ABNORMAL HIGH (ref <0.031)
Troponin I: 0.08 ng/mL — ABNORMAL HIGH (ref <0.031)

## 2014-06-01 MED ORDER — DOFETILIDE 500 MCG PO CAPS
500.0000 ug | ORAL_CAPSULE | Freq: Two times a day (BID) | ORAL | Status: DC
  Filled 2014-06-01 (×2): qty 1

## 2014-06-01 MED ORDER — DIGOXIN 250 MCG PO TABS
0.2500 mg | ORAL_TABLET | Freq: Four times a day (QID) | ORAL | Status: AC
  Administered 2014-06-01 – 2014-06-02 (×2): 0.25 mg via ORAL
  Filled 2014-06-01 (×4): qty 1

## 2014-06-01 MED ORDER — SODIUM CHLORIDE 0.9 % IV SOLN: 250.0000 mL | INTRAVENOUS | Status: DC | PRN

## 2014-06-01 MED ORDER — ASPIRIN 81 MG PO CHEW
81.0000 mg | CHEWABLE_TABLET | Freq: Every day | ORAL | Status: DC
  Administered 2014-06-01 – 2014-06-04 (×4): 81 mg via ORAL
  Filled 2014-06-01 (×4): qty 1

## 2014-06-01 MED ORDER — DOFETILIDE 250 MCG PO CAPS
250.0000 ug | ORAL_CAPSULE | Freq: Two times a day (BID) | ORAL | Status: DC
  Administered 2014-06-01 – 2014-06-02 (×2): 250 ug via ORAL
  Filled 2014-06-01 (×4): qty 1

## 2014-06-01 MED ORDER — DIGOXIN 125 MCG PO TABS
0.1250 mg | ORAL_TABLET | Freq: Every day | ORAL | Status: DC
  Administered 2014-06-02 – 2014-06-04 (×3): 0.125 mg via ORAL
  Filled 2014-06-01 (×4): qty 1

## 2014-06-01 MED ORDER — WARFARIN - PHARMACIST DOSING INPATIENT
Freq: Every day | Status: DC
  Administered 2014-06-02 – 2014-06-03 (×2)

## 2014-06-01 MED ORDER — WARFARIN SODIUM 2.5 MG PO TABS
2.5000 mg | ORAL_TABLET | ORAL | Status: DC
  Administered 2014-06-01 – 2014-06-03 (×2): 2.5 mg via ORAL
  Filled 2014-06-01 (×2): qty 1

## 2014-06-01 MED ORDER — WARFARIN SODIUM 5 MG PO TABS
5.0000 mg | ORAL_TABLET | ORAL | Status: DC
  Administered 2014-06-02: 5 mg via ORAL
  Filled 2014-06-01 (×2): qty 1

## 2014-06-01 MED ORDER — SODIUM CHLORIDE 0.9 % IJ SOLN
3.0000 mL | Freq: Two times a day (BID) | INTRAMUSCULAR | Status: DC
  Administered 2014-06-01 – 2014-06-04 (×4): 3 mL via INTRAVENOUS

## 2014-06-01 MED ORDER — SODIUM CHLORIDE 0.9 % IJ SOLN: 3.0000 mL | INTRAMUSCULAR | Status: DC | PRN

## 2014-06-01 MED ORDER — METOPROLOL TARTRATE 1 MG/ML IV SOLN
2.5000 mg | INTRAVENOUS | Status: DC | PRN
Start: 2014-06-01 — End: 2014-06-04
  Administered 2014-06-01: 2.5 mg via INTRAVENOUS
  Filled 2014-06-01 (×2): qty 5

## 2014-06-01 MED ORDER — CARVEDILOL 12.5 MG PO TABS
12.5000 mg | ORAL_TABLET | Freq: Two times a day (BID) | ORAL | Status: DC
  Administered 2014-06-02 (×2): 12.5 mg via ORAL
  Filled 2014-06-01 (×3): qty 1

## 2014-06-01 NOTE — Progress Notes (Signed)
Pt arrived to floor in NAD, A/Ox4, standby assist. No pain, no SOB. VSS. Pt oriented to room and floor. Will continue to monitor. Ronnette Hila, RN

## 2014-06-01 NOTE — Telephone Encounter (Signed)
Informed patient am not sure who tried to contact him. Patient states that he is still in the hospital. Explained that hopefully whomever was trying to reach him will call back.

## 2014-06-01 NOTE — Progress Notes (Signed)
[facility] for Coumadin Indication: atrial fibrillation  Allergies  Allergen Reactions  . Amoxicillin     REACTION: unspecified  . Penicillins     REACTION: unspecified    Patient Measurements: Height: 5\' 7"  (170.2 cm) Weight: 150 lb 4.8 oz (68.176 kg) IBW/kg (Calculated) : 66.1  Vital Signs: Temp: 97.9 F (36.6 C) (05/03 0601) Temp Source: Oral (05/03 0601) BP: 116/75 mmHg (05/03 0641) Pulse Rate: 115 (05/03 0641)  Labs:  Recent Labs  05/31/14 1924 06/01/14 0100 06/01/14 0540  HGB 15.5  --  14.5  HCT 45.9  --  45.4  PLT 231  --  232  LABPROT  --   --  27.6*  INR  --   --  2.54*  CREATININE 0.91  --  0.93  TROPONINI  --  0.10* 0.09*    Estimated Creatinine Clearance: 65.2 mL/min (by C-G formula based on Cr of 0.93).   Assessment: 12 YOM admitted with chest pain/palpitations, found to be in AFib with RVR. Patient on warfarin PTA for history of AFib- home dose is 5mg  on MWFSat and 2.5mg  on TTSun. Last dose taken PTA was 5/2. Admission INR was 2.54. Hgb and plts wnl- no bleeding noted.   Goal of Therapy:  INR 2-3 Monitor platelets by anticoagulation protocol: Yes   Plan:  -continue home warfarin- 5mg  on MWFSat and 2.5mg  TTSun -daily INR- if remain stable, can drop to 3x weekly -follow s/s bleeding and addition of interacting meds  Sahej Schrieber D. Verina Galeno, PharmD, BCPS Clinical Pharmacist Pager: [phone] 06/01/2014 8:47 AM

## 2014-06-01 NOTE — Telephone Encounter (Signed)
Follow UP  Pt called to follow up. States he missed a call. Not sure who called this pt so we are routing this to the nurse.

## 2014-06-01 NOTE — Consult Note (Signed)
CARDIOLOGY CONSULT NOTE   Patient ID: [name] MRN: [id number] DOB/AGE: Nov 30, 1940 74 y.o.  Admit date: 05/31/2014  Primary Physician   [name], MD Primary Cardiologist   [name] / Dr. [name]  Reason for Consultation  Atrial flutter with RVR  HPI: [name] is a 74 y.o. 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   He has a single chamber ICD for primary precention in the setting of ischemic cardiomyopathy. 2D ECHO 04/14/14 with EF 20-25%, mild LV dilation. There isdyskinesis in scarring of the apical myocardium as well as akinesis of the mid anteroseptal, anterior and anterolateral walls consistent with his prior LAD infarction. G2DD, mod MR, mod-sev LA dilation, mild RV systolic dysfunction, possible increased LA pressure (atrial septal bowing).    He reports that around 3:30AM on 5/2 he awoke with heart palpitations that were consistent with his prior episodes of AF. His palpitations were associated with mild chest discomfort. Symptoms came on at rest and persisted all day. He reports that although he has had a long history of PAF he has been suffering more episodes recently. A remote ICM monitoring note was entered yesterday by Wyona Almas in the office. He reported increasing symptomatic atrial fibrillation and was adivsed by Dr. [name] to increase his coreg from 18.75 mg BID to Coreg to 25 mg in the AM and 18.75 mg in the PM. He was on amiodarone in the past and failed this per his report. It was planned to have an amiodarone level drawn this week to ensure this has cleared his system. His symptoms did not resolve so he presented to the ED around 6-7pm last night. Prior to that he took an extra dose of carvedilol 25mg  but this caused his BP to fall in the SBPs 39mmHg transiently with  some lightheadedness and tunnel vision. This resolved spontaneously when laying down.  In the ED, his EKG was concerning for coarse AF vs AFL with variable block.   Of note, he has had a long history of PAF but becomes quite symptomatic when he is not in SR. He has tried amiodarone in the distant past, but had progressive problems with bradycardia associated with increasing sluggishness on amiodarone. There have been no other attempts at rhythm control. He does not drink caffeine or EtOH. He does not have a history of OSA. An office note from Dr. [name] on 08/06/13 where he noted that the patient wanted to pursue hospital admission for dofetilide initiation. He later canceled this as he was feeling better on carvedilol.    Past Medical History  Diagnosis Date  . Coronary artery disease     Hx MI 1992, CABG 1998 , Nuc study 11.2013 large scar but no ischemia  . 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 Atrial fib/flutter, transient   . GERD (gastroesophageal reflux disease)   . H. pylori infection     Hx of   . Cardiomyopathy, ischemic 2011    with EF 25-35% by echo  . H/O myocardial infarction, greater than 8 weeks 1992    large ant wall injury  . NSVT (nonsustained ventricular tachycardia)      Past Surgical History  Procedure Laterality Date  . Nasal sinus surgery  05/31/2010  Dr. Benjamine Mola  . Coronary artery bypass graft  1998    x 5  . Cataract extraction, bilateral    . Knee surgery      right  . Hernia repair    . Icd generator change  01/2008    medtronic, hx+ EP study    Allergies  Allergen Reactions  . Amoxicillin     REACTION: unspecified  . Penicillins     REACTION: unspecified    I have reviewed the patient's current medications . allopurinol  150 mg Oral Daily  . aspirin  81 mg Oral Daily  . isosorbide mononitrate  30 mg Oral Daily  . lisinopril  10 mg Oral BID  . metoprolol tartrate  25 mg Oral BID  . montelukast   10 mg Oral QHS  . pantoprazole  40 mg Oral Daily  . simvastatin  40 mg Oral QHS  . spironolactone  12.5 mg Oral BID  . warfarin  2.5 mg Oral Once per day on Sun Tue Thu  . [START ON 06/02/2014] warfarin  5 mg Oral Once per day on Mon Wed Fri Sat  . Warfarin - Pharmacist Dosing Inpatient   Does not apply q1800     acetaminophen, metoprolol, ondansetron (ZOFRAN) IV  Prior to Admission medications   Medication Sig Start Date End Date Taking? Authorizing Provider  acetaminophen (TYLENOL) 650 MG CR tablet Take 650 mg by mouth 2 (two) times daily.    Yes Historical Provider, MD  allopurinol (ZYLOPRIM) 300 MG tablet Take 150 mg by mouth daily.  08/16/10  Yes Historical Provider, MD  carvedilol (COREG) 12.5 MG tablet Take 1 tablet (12.5 mg total) by mouth 2 (two) times daily. Patient taking differently: Take 12.5 mg by mouth. Take with 6.25 mg for a total of 18.75 mg 03/12/14  Yes Troy Sine, MD  carvedilol (COREG) 6.25 MG tablet Take 1 tablet (6.25 mg total) by mouth 2 (two) times daily. Patient taking differently: Take 6.25 mg by mouth daily. Take with 12.5mg  for a total of 18.75 mg 03/12/14  Yes Troy Sine, MD  cetirizine (ZYRTEC) 10 MG tablet Take 10 mg by mouth daily.     Yes Historical Provider, MD  Cholecalciferol (VITAMIN D) 2000 UNITS CAPS Take 1 capsule by mouth daily.   Yes Historical Provider, MD  esomeprazole (NEXIUM) 40 MG capsule Take 1 capsule (40 mg total) by mouth daily. 04/08/14  Yes [name] D Zehr, PA-C  glycopyrrolate (ROBINUL) 2 MG tablet Take 1 tablet (2 mg total) by mouth 2 (two) times daily. 05/20/14  Yes Inda Castle, MD  isosorbide mononitrate (IMDUR) 30 MG 24 hr tablet TAKE ONE-HALF TO ONE TABLET BY MOUTH ONCE DAILY 05/11/14  Yes Troy Sine, MD  lisinopril (PRINIVIL,ZESTRIL) 20 MG tablet Take 0.5 tablets (10 mg total) by mouth 2 (two) times daily. 03/10/14  Yes Troy Sine, MD  magnesium oxide (MAG-OX) 400 MG tablet Take 400 mg by mouth daily.   Yes Historical  Provider, MD  mometasone (ASMANEX) 220 MCG/INH inhaler Inhale 1 puff into the lungs daily.    Yes Historical Provider, MD  mometasone (NASONEX) 50 MCG/ACT nasal spray Place 2 sprays into the nose daily as needed.    Yes Historical Provider, MD  montelukast (SINGULAIR) 10 MG tablet Take 1 tablet (10 mg total) by mouth at bedtime. 04/29/13  Yes Luke K Kilroy, PA-C  nitroGLYCERIN (NITROSTAT) 0.4 MG SL tablet Place 0.4 mg under the tongue every 5 (five) minutes as needed  for chest pain.   Yes Historical Provider, MD  silodosin (RAPAFLO) 4 MG CAPS capsule Take 8 mg by mouth. Every 2 days   Yes Historical Provider, MD  simvastatin (ZOCOR) 40 MG tablet Take 1 tablet (40 mg total) by mouth at bedtime. 04/29/13  Yes Luke K Kilroy, PA-C  spironolactone (ALDACTONE) 25 MG tablet Take 0.5 tablet twice daily. 04/05/14  Yes Troy Sine, MD  warfarin (COUMADIN) 5 MG tablet Take 1 tablet by mouth daily or as directed by coumadin clinic Patient taking differently: Take 2.5-5 mg by mouth daily. Take 5mg  on Mon /Wed / Fri / Sat Take 2.5mg  on Sun / Tues / Thur 12/09/13  Yes Kristin L Alvstad, RPH-CPP  hydrocortisone (ANUSOL-HC) 25 MG suppository Place 1 suppository (25 mg total) rectally at bedtime. For 10 days 05/21/14   Willia Craze, NP  Hyoscyamine Sulfate 0.375 MG TBCR Take 1 tablet (0.375 mg total) by mouth 2 (two) times daily as needed. 05/18/14   Inda Castle, MD     History   Social History  . Marital Status: Married    Spouse Name: N/A  . Number of Children: 3  . Years of Education: N/A   Occupational History  . slef employed semi retired    Social History Main Topics  . Smoking status: Never Smoker   . Smokeless tobacco: Never Used  . Alcohol Use: 1.2 oz/week    0 Standard drinks or equivalent, 2 Glasses of wine per week     Comment: socially  . Drug Use: No  . Sexual Activity: Not on file   Other Topics Concern  . Not on file   Social History Narrative    Family Status  Relation  Status Death Age  . Father Deceased 101  . Mother Deceased 58  . Sister Deceased   . Brother Alive   . Sister Alive    Family History  Problem Relation Age of Onset  . Colon cancer Neg Hx   . Heart disease Father     questionable  . Stroke Mother   . Heart failure Sister   . Healthy Brother   . Healthy Sister   . Parkinsonism Brother      ROS:  Full 14 point review of systems complete and found to be negative unless listed above.  Physical Exam: Blood pressure 98/63, pulse 160, temperature 97.9 F (36.6 C), temperature source Oral, resp. rate 17, height 5\' 7"  (1.702 m), weight 150 lb 4.8 oz (68.176 kg), SpO2 97 %.  General: Well developed, well nourished, male in no acute distress Head: Eyes PERRLA, No xanthomas.   Normocephalic and atraumatic, oropharynx without edema or exudate.   Lungs: CTAB Heart: irreg irreg. S1 S2, no rub/gallop,  pulses are 2+ extrem.   Neck: No carotid bruits. No lymphadenopathy. No JVD. Abdomen: Bowel sounds present, abdomen soft and non-tender without masses or hernias noted. Msk:  No spine or cva tenderness. No weakness, no joint deformities or effusions. Extremities: No clubbing or cyanosis. No edema. Wearing compression stockings.  Neuro: Alert and oriented X 3. No focal deficits noted. Psych:  Good affect, responds appropriately Skin: No rashes or lesions noted.  Labs:   Lab Results  Component Value Date   WBC 10.4 06/01/2014   HGB 14.5 06/01/2014   HCT 45.4 06/01/2014   MCV 86.3 06/01/2014   PLT 232 06/01/2014    Recent Labs  06/01/14 0540  INR 2.54*    Recent Labs Lab 06/01/14 0540  NA 136  K 4.6  CL 102  CO2 24  BUN 11  CREATININE 0.93  CALCIUM 9.2  GLUCOSE 87   MAGNESIUM  Date Value Ref Range Status  05/25/2014 2.3 1.5 - 2.5 mg/dL Final    Recent Labs  06/01/14 0100 06/01/14 0540  TROPONINI 0.10* 0.09*    Recent Labs  05/31/14 1930  TROPIPOC 0.00   No results found for: PROBNP Lab Results  Component  Value Date   CHOL 133 06/01/2014   HDL 35* 06/01/2014   LDLCALC 78 06/01/2014   TRIG 100 06/01/2014     Echo: 2D ECHO TTE: 04/14/2014 Study Conclusions - Left ventricle: The cavity size was mildly dilated. Systolic function was severely reduced. The estimated ejection fraction was in the range of 20% to 25%. Dyskinesis and scarring of the apical myocardium. Akinesis and scarring of the mid-apicalanteroseptal, anterior, and anterolateral myocardium; consistent with infarction in the distribution of the left anterior descending coronary artery; unchanged from the study of 01/01/2013. There was a reduced contribution of atrial contraction to ventricular filling, due to increased ventricular diastolic pressure or atrial contractile dysfunction. Features are consistent with a pseudonormal left ventricular filling pattern, with concomitant abnormal relaxation and increased filling pressure (grade 2 diastolic dysfunction). Cannot exclude apical thrombus. - Aortic valve: There was trivial regurgitation. - Mitral valve: There was moderate regurgitation directed centrally. - Left atrium: The atrium was moderately to severely dilated. - Right ventricle: Systolic function was mildly reduced. - Atrial septum: The septum bowed from left to right, consistent with increased left atrial pressure. No defect or patent foramen ovale was identified.  ECG:  HR 122 Atrial flutter with variable A-V block Left axis deviation Non-specific intra-ventricular conduction block  Radiology:  Dg Chest 2 View  05/31/2014   CLINICAL DATA:  Palpitations, upper chest pain  EXAM: CHEST  2 VIEW  COMPARISON:  Radiograph 04/21/2013  FINDINGS: Left-sided pacemaker overlies normal cardiac silhouette. The cardiac silhouette is upper limits of normal. No effusion, infiltrate, pneumothorax. Degenerative osteophytosis of the thoracic spine.  IMPRESSION: Mild cardiomegaly without acute  cardiopulmonary findings.   Electronically Signed   By: Suzy Bouchard M.D.   On: 05/31/2014 21:00    ASSESSMENT AND PLAN:    Active Problems:   Atrial fibrillation with rapid ventricular response  HAITHAM DOLINSKY is a 74 y.o. 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   Atrial fib/flutter with RVR- HR currently in 80s after 2.5 mg IV lopressor this AM with rates in 160s. Has not been well controlled this AM. ( worried as his defibrillator will go off >167 bpm) -- He was switched to metoprolol 25mg  BID from carvedilol for less BP drop -- AAD options limited to amiodarone vs tikosyn given his low EF. He has had issues with amiodarone with bradycardia in the past. He is scheduled for an amiodarone level later this week. I will get while he is here. There was discussion of admission for Tikosyn initiation in 07/2013. Patient interested in Tikosyn route at this time.  Average Qtc 421 from ECG today in atrial flutter.  -- Ablation likely not going to be considered in the setting of mod-sev LA dilation -- His CHADS-VASC = 5 (CHF, HTN, Age, prior stroke, Vasc disease) placing him at moderate-high risk of stroke. He is already on  coumadin. INR 2.54.   Chest pain/Mild troponin elevation- likely demand ischemia in the setting of AF-RVR. No further chest pain.  -- 7 Beats NSVT on tele. He has ICD in place. Continue BB.  -- Troponin 0.10--> 0.09. Continue to monitor.  -- Continue ASA, BB, statin, ACEi, nitrate. He has not had an ischemic eval in some time. May want to update this at some point.   Chronic systolic CHF/Ischemic CM -- BNP mildly elevated at 648 but CXR clear -- Appears euvolemic on exam. Optival measures yesterday do not show increased impedence.  -- Continue BB and lisinopril and spiro 12.5 mg BID  Dr.  [name] to follow. I have discontinued his NPO as I do not think we would proceed with DCCV today as he is now well rate controlled and feeling better.    SignedCrista Luria 06/01/2014 10:40 AM  Pager [phone]  Co-Sign MD  Will begin dofetilide  reveiwed risks and benefits Plan DCCV on Thurday Will return coreg for metoprolol And add dig for augmented rate control

## 2014-06-01 NOTE — Progress Notes (Addendum)
Pt in A flutter overnight, HR in 80s at rest. Pt with one episode of 5 beat NSVT, troponin 0.10, pt asleep and no complaints of pain. Dr. Clayborne Artist notified via textpage.   HR up to 120/140 non-sustained occasionally throughout the night. Will continue to monitor. Ronnette Hila, RN

## 2014-06-01 NOTE — H&P (Cosign Needed)
Patient ID: [name] MRN: [id number], DOB/AGE: [birth year]   Admit date: 05/31/2014   Primary Physician: [name], MD Primary Cardiologist: Kelly/Klein  CC: Atrial fibrillation with rapid ventricular resposne  Problem List  Past Medical History  Diagnosis Date  . Coronary artery disease     Hx MI 1992, CABG 1998 , Nuc study 11.2013 large scar but no ischemia  . 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 Atrial fib/flutter, transient   . GERD (gastroesophageal reflux disease)   . H. pylori infection     Hx of   . Cardiomyopathy, ischemic 2011    with EF 25-35% by echo  . H/O myocardial infarction, greater than 8 weeks 1992    large ant wall injury  . NSVT (nonsustained ventricular tachycardia)     Past Surgical History  Procedure Laterality Date  . Nasal sinus surgery  05/31/2010    Dr. Benjamine Mola  . Coronary artery bypass graft  1998    x 5  . Cataract extraction, bilateral    . Knee surgery      right  . Hernia repair    . Icd generator change  01/2008    medtronic, hx+ EP study     Allergies  Allergies  Allergen Reactions  . Amoxicillin     REACTION: unspecified  . Penicillins     REACTION: unspecified    HPI The patient is a 74M with a history of CAD s/p CABG 1998, ischemic CMP EF 20-25% s/p ICD, PAF anticoagulated with warfarin, who presents with palpitations and chest discomfort. He reports that around 3:30AM on 5/2 he awoke with heart palpitations that were consistent with his prior episodes of AF. This time during the course of the day, however, his palpitations were associated with mild chest discomfort. Symptoms came on at rest and persisted all day. He reports that although he has had a long history of PAF he has been suffering more episodes recently.  Given that his symptoms did not resolve he presented to the ED around 6-7pm. Prior to that he took an extra dose of carvedilol 25mg  but this caused his  BP to fall in the SBPs 57mmHg transiently with some lightheadedness and tunnel vision. This resolved spontaneously.  In the ED, his EKG was concerning for coarse AF vs AFL with variable block.   Of note, he has had a long history of PAF but becomes quite symptomatic when he is not in SR. He has tried amiodarone in the distant past. There have been no other attempts at rhythm control. He does not drink caffeine or EtOH. He does not have a history of OSA>  Home Medications  Prior to Admission medications   Medication Sig Start Date End Date Taking? Authorizing Provider  acetaminophen (TYLENOL) 650 MG CR tablet Take 650 mg by mouth 2 (two) times daily.    Yes Historical Provider, MD  allopurinol (ZYLOPRIM) 300 MG tablet Take 150 mg by mouth daily.  08/16/10  Yes Historical Provider, MD  carvedilol (COREG) 12.5 MG tablet Take 1 tablet (12.5 mg total) by mouth 2 (two) times daily. Patient taking differently: Take 12.5 mg by mouth. Take with 6.25 mg for a total of 18.75 mg 03/12/14  Yes Troy Sine, MD  carvedilol (COREG) 6.25 MG tablet Take 1 tablet (6.25 mg total) by mouth 2 (two) times daily. Patient taking differently: Take 6.25 mg by mouth daily. Take with 12.5mg  for a total of  18.75 mg 03/12/14  Yes Troy Sine, MD  cetirizine (ZYRTEC) 10 MG tablet Take 10 mg by mouth daily.     Yes Historical Provider, MD  Cholecalciferol (VITAMIN D) 2000 UNITS CAPS Take 1 capsule by mouth daily.   Yes Historical Provider, MD  esomeprazole (NEXIUM) 40 MG capsule Take 1 capsule (40 mg total) by mouth daily. 04/08/14  Yes [name] D Zehr, PA-C  glycopyrrolate (ROBINUL) 2 MG tablet Take 1 tablet (2 mg total) by mouth 2 (two) times daily. 05/20/14  Yes Inda Castle, MD  isosorbide mononitrate (IMDUR) 30 MG 24 hr tablet TAKE ONE-HALF TO ONE TABLET BY MOUTH ONCE DAILY 05/11/14  Yes Troy Sine, MD  lisinopril (PRINIVIL,ZESTRIL) 20 MG tablet Take 0.5 tablets (10 mg total) by mouth 2 (two) times daily. 03/10/14   Yes Troy Sine, MD  magnesium oxide (MAG-OX) 400 MG tablet Take 400 mg by mouth daily.   Yes Historical Provider, MD  mometasone (ASMANEX) 220 MCG/INH inhaler Inhale 1 puff into the lungs daily.    Yes Historical Provider, MD  mometasone (NASONEX) 50 MCG/ACT nasal spray Place 2 sprays into the nose daily as needed.    Yes Historical Provider, MD  montelukast (SINGULAIR) 10 MG tablet Take 1 tablet (10 mg total) by mouth at bedtime. 04/29/13  Yes Luke K Kilroy, PA-C  nitroGLYCERIN (NITROSTAT) 0.4 MG SL tablet Place 0.4 mg under the tongue every 5 (five) minutes as needed for chest pain.   Yes Historical Provider, MD  silodosin (RAPAFLO) 4 MG CAPS capsule Take 8 mg by mouth. Every 2 days   Yes Historical Provider, MD  simvastatin (ZOCOR) 40 MG tablet Take 1 tablet (40 mg total) by mouth at bedtime. 04/29/13  Yes Luke K Kilroy, PA-C  spironolactone (ALDACTONE) 25 MG tablet Take 0.5 tablet twice daily. 04/05/14  Yes Troy Sine, MD  warfarin (COUMADIN) 5 MG tablet Take 1 tablet by mouth daily or as directed by coumadin clinic Patient taking differently: Take 2.5-5 mg by mouth daily. Take 5mg  on Mon /Wed / Fri / Sat Take 2.5mg  on Sun / Tues / Thur 12/09/13  Yes Kristin L Alvstad, RPH-CPP  hydrocortisone (ANUSOL-HC) 25 MG suppository Place 1 suppository (25 mg total) rectally at bedtime. For 10 days 05/21/14   Willia Craze, NP  Hyoscyamine Sulfate 0.375 MG TBCR Take 1 tablet (0.375 mg total) by mouth 2 (two) times daily as needed. 05/18/14   Inda Castle, MD    Family History  Family History  Problem Relation Age of Onset  . Colon cancer Neg Hx   . Heart disease Father     questionable  . Stroke Mother   . Heart failure Sister   . Healthy Brother   . Healthy Sister   . Parkinsonism Brother     Social History  History   Social History  . Marital Status: Married    Spouse Name: N/A  . Number of Children: 3  . Years of Education: N/A   Occupational History  . slef employed semi  retired    Social History Main Topics  . Smoking status: Never Smoker   . Smokeless tobacco: Never Used  . Alcohol Use: 1.2 oz/week    0 Standard drinks or equivalent, 2 Glasses of wine per week     Comment: socially  . Drug Use: No  . Sexual Activity: Not on file   Other Topics Concern  . Not on file   Social History Narrative  Review of Systems General:  No chills, fever, night sweats or weight changes.  Cardiovascular:  +chest pain, -dyspnea on exertion, -edema, -orthopnea, +palpitations,- paroxysmal nocturnal dyspnea. Dermatological: No rash, lesions/masses Respiratory: No cough, dyspnea Urologic: No hematuria, dysuria Abdominal:   No nausea, vomiting, diarrhea, bright red blood per rectum, melena, or hematemesis Neurologic:  No visual changes, wkns, changes in mental status. All other systems reviewed and are otherwise negative except as noted above.  Physical Exam  Blood pressure 116/63, pulse 86, temperature 97.9 F (36.6 C), temperature source Oral, resp. rate 19, height 5\' 7"  (1.702 m), weight 150 lb 4.8 oz (68.176 kg), SpO2 98 %.  General: Pleasant, NAD Psych: Normal affect. Neuro: Alert and oriented X 3. Moves all extremities spontaneously. HEENT: Normal  Neck: Supple without bruits or JVD. Lungs:  Resp regular and unlabored, CTA. Heart: IRIR no s3, s4, or murmurs. Abdomen: Soft, non-tender, non-distended, BS + x 4.  Extremities: No clubbing, cyanosis or edema. DP/PT/Radials 2+ and equal bilaterally.  Labs  Troponin [facility] Test)  Recent Labs  05/31/14 1930  TROPIPOC 0.00   No results for input(s): CKTOTAL, CKMB, TROPONINI in the last 72 hours. Lab Results  Component Value Date   WBC 12.0* 05/31/2014   HGB 15.5 05/31/2014   HCT 45.9 05/31/2014   MCV 84.7 05/31/2014   PLT 231 05/31/2014    Recent Labs Lab 05/31/14 1924  NA 134*  K 4.4  CL 103  CO2 21*  BUN 14  CREATININE 0.91  CALCIUM 9.3  GLUCOSE 93   Lab Results  Component  Value Date   CHOL 111 06/01/2013   HDL 29* 06/01/2013   LDLCALC 61 06/01/2013   TRIG 104 06/01/2013   No results found for: DDIMER   Radiology/Studies  Dg Chest 2 View  05/31/2014   CLINICAL DATA:  Palpitations, upper chest pain  EXAM: CHEST  2 VIEW  COMPARISON:  Radiograph 04/21/2013  FINDINGS: Left-sided pacemaker overlies normal cardiac silhouette. The cardiac silhouette is upper limits of normal. No effusion, infiltrate, pneumothorax. Degenerative osteophytosis of the thoracic spine.  IMPRESSION: Mild cardiomegaly without acute cardiopulmonary findings.   Electronically Signed   By: Suzy Bouchard M.D.   On: 05/31/2014 21:00    ECG AFL with variable block but could also be coarse AF  TTE: 04/14/2014 Study Conclusions  - Left ventricle: The cavity size was mildly dilated. Systolic function was severely reduced. The estimated ejection fraction was in the range of 20% to 25%. Dyskinesis and scarring of the apical myocardium. Akinesis and scarring of the mid-apicalanteroseptal, anterior, and anterolateral myocardium; consistent with infarction in the distribution of the left anterior descending coronary artery; unchanged from the study of 01/01/2013. There was a reduced contribution of atrial contraction to ventricular filling, due to increased ventricular diastolic pressure or atrial contractile dysfunction. Features are consistent with a pseudonormal left ventricular filling pattern, with concomitant abnormal relaxation and increased filling pressure (grade 2 diastolic dysfunction). Cannot exclude apical thrombus. - Aortic valve: There was trivial regurgitation. - Mitral valve: There was moderate regurgitation directed centrally. - Left atrium: The atrium was moderately to severely dilated. - Right ventricle: Systolic function was mildly reduced. - Atrial septum: The septum bowed from left to right, consistent with increased left atrial  pressure. No defect or patent foramen ovale was identified.  ASSESSMENT AND PLAN The patient is a 34M with a history of CAD s/p CABG 1998, ischemic CMP EF 20-25% s/p ICD, PAF anticoagulated with warfarin, who presents with palpitations  and chest discomfort. His symptoms are most likely due to his PAF but he could also have worsening ischemia given his prior history. I favor the former. He becomes exquisitely symptomatic when he is AF. He does not have any clear easily reversible triggers for his symptoms. Given his symptom burden would consider more aggressive rhythm control for him  #AF-RVR -he is now in a more regular rate -switch carvedilol to metoprolol for less BP drop -AAD options limited to amiodarone vs tikosyn given his low EF.  -He has never undergone DCCV. May be reasonable to try DCCV +/- AAD with plan for ablation if this plan fails. He has been anticoagulated for years but need to ensure that he is therapeutic to obviate need for TEE. -His CHADS-VASC = 5 (CHF, HTN, Age, prior stroke, Vasc disease) placing him at moderate-high risk of stroke. He is already on coumadin. Will check INR as inpatient  #Chest pain: Likely in the setting of AF-RVR -defer anticoagulation as he is on coumadin -ASA 81 -continue BB, statin, ACEi, nitrate -serial markers  Signed, Raliegh Ip, MD MPH 06/01/2014, 2:25 AM

## 2014-06-01 NOTE — Progress Notes (Signed)
   06/01/14 0641  Vitals  BP 116/75 mmHg  BP Location Left Arm  BP Method Automatic  Patient Position (if appropriate) Lying  Pulse Rate (!) 115     06/01/14 0641  Pain Assessment  Pain Assessment 0-10  Pain Score 5  Pain Type Acute pain  Pain Location Chest  Pain Orientation Right;Left;Upper  Pain Descriptors / Indicators Aching;Pounding  Pain Onset With Activity  Pain Intervention(s) MD notified (Comment) (rest)    Pt HR up to 150s sustaining when in bathroom this AM. Pt C/o shortness of breath, pounding in chest and aching chest pain 5/10. EKG performed, VSS, HR now 100-120. Rhonda Barrett notified. New order for PRN lopressor and to give Imdur as soon as available from pharmacy. Troponin from this AM in process currently. Will continue to monitor. Ronnette Hila, RN

## 2014-06-01 NOTE — Progress Notes (Signed)
ANTICOAGULATION CONSULT NOTE - Initial Consult  Pharmacy Consult for Coumadin Indication: atrial fibrillation  Allergies  Allergen Reactions  . Amoxicillin     REACTION: unspecified  . Penicillins     REACTION: unspecified    Patient Measurements: Height: 5\' 7"  (170.2 cm) Weight: 155 lb 7 oz (70.506 kg) IBW/kg (Calculated) : 66.1  Vital Signs: Temp: 97.5 F (36.4 C) (05/02 1917) Temp Source: Oral (05/02 1917) BP: 107/66 mmHg (05/02 2315) Pulse Rate: 58 (05/02 2315)  Labs:  Recent Labs  05/31/14 1924  HGB 15.5  HCT 45.9  PLT 231  CREATININE 0.91    Estimated Creatinine Clearance: 66.6 mL/min (by C-G formula based on Cr of 0.91).   Medical History: Past Medical History  Diagnosis Date  . Coronary artery disease     Hx MI 1992, CABG 1998 , Nuc study 11.2013 large scar but no ischemia  . 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 Atrial fib/flutter, transient   . GERD (gastroesophageal reflux disease)   . H. pylori infection     Hx of   . Cardiomyopathy, ischemic 2011    with EF 25-35% by echo  . H/O myocardial infarction, greater than 8 weeks 1992    large ant wall injury  . NSVT (nonsustained ventricular tachycardia)     Medications:  Prescriptions prior to admission  Medication Sig Dispense Refill Last Dose  . acetaminophen (TYLENOL) 650 MG CR tablet Take 650 mg by mouth 2 (two) times daily.    05/30/2014 at Unknown time  . allopurinol (ZYLOPRIM) 300 MG tablet Take 150 mg by mouth daily.    05/30/2014 at Unknown time  . carvedilol (COREG) 12.5 MG tablet Take 1 tablet (12.5 mg total) by mouth 2 (two) times daily. (Patient taking differently: Take 12.5 mg by mouth. Take with 6.25 mg for a total of 18.75 mg) 180 tablet 3 05/31/2014 at 0900  . carvedilol (COREG) 6.25 MG tablet Take 1 tablet (6.25 mg total) by mouth 2 (two) times daily. (Patient taking differently: Take 6.25 mg by mouth daily. Take with 12.5mg  for a total  of 18.75 mg) 180 tablet 3 05/31/2014 at 0900  . cetirizine (ZYRTEC) 10 MG tablet Take 10 mg by mouth daily.     05/30/2014 at Unknown time  . Cholecalciferol (VITAMIN D) 2000 UNITS CAPS Take 1 capsule by mouth daily.   05/30/2014 at Unknown time  . esomeprazole (NEXIUM) 40 MG capsule Take 1 capsule (40 mg total) by mouth daily. 30 capsule 11 05/31/2014 at Unknown time  . glycopyrrolate (ROBINUL) 2 MG tablet Take 1 tablet (2 mg total) by mouth 2 (two) times daily. 60 tablet 3 05/31/2014 at Unknown time  . isosorbide mononitrate (IMDUR) 30 MG 24 hr tablet TAKE ONE-HALF TO ONE TABLET BY MOUTH ONCE DAILY 90 tablet 2 05/31/2014 at Unknown time  . lisinopril (PRINIVIL,ZESTRIL) 20 MG tablet Take 0.5 tablets (10 mg total) by mouth 2 (two) times daily. 90 tablet 3 05/31/2014 at Unknown time  . magnesium oxide (MAG-OX) 400 MG tablet Take 400 mg by mouth daily.   05/30/2014 at Unknown time  . mometasone (ASMANEX) 220 MCG/INH inhaler Inhale 1 puff into the lungs daily.    05/30/2014 at Unknown time  . mometasone (NASONEX) 50 MCG/ACT nasal spray Place 2 sprays into the nose daily as needed.    2-3 days  . montelukast (SINGULAIR) 10 MG tablet Take 1 tablet (10 mg total) by mouth at bedtime. 90 tablet  3 05/30/2014 at Unknown time  . nitroGLYCERIN (NITROSTAT) 0.4 MG SL tablet Place 0.4 mg under the tongue every 5 (five) minutes as needed for chest pain.   unknown  . silodosin (RAPAFLO) 4 MG CAPS capsule Take 8 mg by mouth. Every 2 days   05/30/2014 at Unknown time  . simvastatin (ZOCOR) 40 MG tablet Take 1 tablet (40 mg total) by mouth at bedtime. 90 tablet 3 05/30/2014 at Unknown time  . spironolactone (ALDACTONE) 25 MG tablet Take 0.5 tablet twice daily. 90 tablet 3 05/31/2014 at Unknown time  . warfarin (COUMADIN) 5 MG tablet Take 1 tablet by mouth daily or as directed by coumadin clinic (Patient taking differently: Take 2.5-5 mg by mouth daily. Take 5mg  on Mon /Wed / Fri / Sat Take 2.5mg  on Sun / Tues / Thur) 90 tablet 1 05/31/2014 at  Unknown time  . hydrocortisone (ANUSOL-HC) 25 MG suppository Place 1 suppository (25 mg total) rectally at bedtime. For 10 days 10 suppository 0   . Hyoscyamine Sulfate 0.375 MG TBCR Take 1 tablet (0.375 mg total) by mouth 2 (two) times daily as needed. 60 tablet 1 Taking    Assessment: 74 y.o. male admitted with chest pain/palpitations, h/o Afib, to continue Coumadin   Goal of Therapy:  INR 2-3 Monitor platelets by anticoagulation protocol: Yes   Plan:  Daily INR  Caryl Pina 06/01/2014,12:08 AM

## 2014-06-01 NOTE — Care Management Note (Signed)
Case Management Note  Patient Details  Name: YOBANI SCHERTZER MRN: [id number] Date of Birth: 1940-08-22  Subjective/Objective:           11M with a history of CAD s/p CABG 1998, ischemic CMP EF 20-25% s/p ICD, PAF anticoagulated with warfarin, who presents with palpitations and chest discomfort. He reports that around 3:30AM on 5/2 he awoke with heart palpitations that were consistent with his prior episodes of AF./ Home with spouse.         Action/Plan: -switch carvedilol to metoprolol for less BP drop -AAD options limited to amiodarone vs tikosyn given his low EF.  -He has never undergone DCCV. May be reasonable to try DCCV +/- AAD with plan for ablation if this plan fails.  -Will check INR as inpatient.// Access for disposition needs. Expected Discharge Date:  06/03/14               Expected Discharge Plan:  [place]  In-House Referral:     Discharge planning Services  CM Consult  Post Acute Care Choice:  NA Choice offered to:     DME Arranged:    DME Agency:     HH Arranged:    HH Agency:     Status of Service:  In process, will continue to follow  Medicare Important Message Given:    Date Medicare IM Given:    Medicare IM give by:    Date Additional Medicare IM Given:    Additional Medicare Important Message give by:     If discussed at [place] of Stay Meetings, dates discussed:    Additional Comments:  Fuller Mandril, RN 06/01/2014, 11:34 AM

## 2014-06-02 LAB — BASIC METABOLIC PANEL
ANION GAP: 10 (ref 5–15)
BUN: 15 mg/dL (ref 6–20)
CALCIUM: 8.9 mg/dL (ref 8.9–10.3)
CHLORIDE: 101 mmol/L (ref 101–111)
CO2: 23 mmol/L (ref 22–32)
Creatinine, Ser: 0.88 mg/dL (ref 0.61–1.24)
GFR calc non Af Amer: >60 mL/min (ref >60)
Glucose, Bld: 90 mg/dL (ref 70–99)
Potassium: 4.1 mmol/L (ref 3.5–5.1)
Sodium: 134 mmol/L — ABNORMAL LOW (ref 135–145)

## 2014-06-02 LAB — PROTIME-INR
INR: 2.82 — ABNORMAL HIGH (ref 0.00–1.49)
PROTHROMBIN TIME: 29.9 s — AB (ref 11.6–15.2)

## 2014-06-02 LAB — MAGNESIUM: Magnesium: 1.9 mg/dL (ref 1.7–2.4)

## 2014-06-02 MED ORDER — DOFETILIDE 500 MCG PO CAPS
500.0000 ug | ORAL_CAPSULE | Freq: Two times a day (BID) | ORAL | Status: DC
  Administered 2014-06-02 – 2014-06-04 (×4): 500 ug via ORAL
  Filled 2014-06-02 (×5): qty 1

## 2014-06-02 MED ORDER — DOFETILIDE 250 MCG PO CAPS
250.0000 ug | ORAL_CAPSULE | ORAL | Status: DC
  Filled 2014-06-02: qty 1

## 2014-06-02 MED ORDER — DOFETILIDE 125 MCG PO CAPS: 375.0000 ug | ORAL_CAPSULE | Freq: Two times a day (BID) | ORAL | Status: DC

## 2014-06-02 MED ORDER — CARVEDILOL 12.5 MG PO TABS
12.5000 mg | ORAL_TABLET | Freq: Two times a day (BID) | ORAL | Status: DC
  Administered 2014-06-03: 12.5 mg via ORAL
  Filled 2014-06-02 (×5): qty 1

## 2014-06-02 NOTE — Progress Notes (Signed)
Pt. With elevated HR sustaining 130s-140s, 5 minutes. Pt. Alert and oriented and asymptomatic at the time. Pt. Denies pain or discomfort. VSS. Pt. HR now 68. RN will continue to monitor pt. For changes in condition. Jacklyn Branan, Katherine Roan

## 2014-06-02 NOTE — Progress Notes (Signed)
[place] for Coumadin Indication: atrial fibrillation  Allergies  Allergen Reactions  . Amoxicillin     REACTION: unspecified  . Penicillins     REACTION: unspecified    Patient Measurements: Height: 5\' 7"  (170.2 cm) Weight: 146 lb 14.4 oz (66.633 kg) (Scale B) IBW/kg (Calculated) : 66.1  Vital Signs: Temp: 98 F (36.7 C) (05/04 0611) Temp Source: Oral (05/04 0611) BP: 110/50 mmHg (05/04 1040) Pulse Rate: 66 (05/04 1040)  Labs:  Recent Labs  05/31/14 1924 06/01/14 0100 06/01/14 0540 06/01/14 1125 06/01/14 2016 06/02/14 0324  HGB 15.5  --  14.5  --   --   --   HCT 45.9  --  45.4  --   --   --   PLT 231  --  232  --   --   --   LABPROT  --   --  27.6*  --   --  29.9*  INR  --   --  2.54*  --   --  2.82*  CREATININE 0.91  --  0.93  --  1.13 0.88  TROPONINI  --  0.10* 0.09* 0.08*  --   --     Estimated Creatinine Clearance: 68.9 mL/min (by C-G formula based on Cr of 0.88).   Assessment: 71 YOM admitted with chest pain/palpitations, found to be in AFib with RVR. Patient on warfarin PTA for history of AFib- home dose is 5mg  on MWFSat and 2.5mg  on TTSun. Admission INR was 2.54. INR remains therapeutic at 2.82  Hgb and plts wnl- no bleeding noted.   Goal of Therapy:  INR 2-3 Monitor platelets by anticoagulation protocol: Yes   Plan:  -continue home warfarin- 5mg  on MWFSat and 2.5mg  TTSun -daily INR- if remain stable, can drop to 3x weekly -follow s/s bleeding and addition of interacting meds  Albertina Parr, PharmD., BCPS Clinical Pharmacist Pager [phone]

## 2014-06-02 NOTE — Progress Notes (Signed)
Patient Name: [name] Date of Encounter: 06/02/2014     Active Problems:   Atrial fibrillation with rapid ventricular response    SUBJECTIVE  No complaints. Up reading the newspaper. Feeling well. Sometimes can feel his heart "skip a beat"   CURRENT MEDS . allopurinol  150 mg Oral Daily  . aspirin  81 mg Oral Daily  . carvedilol  12.5 mg Oral BID WC  . digoxin  0.25 mg Oral Q6H   Followed by  . digoxin  0.125 mg Oral Daily  . dofetilide  250 mcg Oral BID  . isosorbide mononitrate  30 mg Oral Daily  . lisinopril  10 mg Oral BID  . montelukast  10 mg Oral QHS  . pantoprazole  40 mg Oral Daily  . simvastatin  40 mg Oral QHS  . sodium chloride  3 mL Intravenous Q12H  . spironolactone  12.5 mg Oral BID  . warfarin  2.5 mg Oral Once per day on Sun Tue Thu  . warfarin  5 mg Oral Once per day on Mon Wed Fri Sat  . Warfarin - Pharmacist Dosing Inpatient   Does not apply q1800    OBJECTIVE  Filed Vitals:   06/02/14 0223 06/02/14 0606 06/02/14 0611 06/02/14 0838  BP:   107/63 125/72  Pulse: 63 58 63 68  Temp:   98 F (36.7 C)   TempSrc:   Oral   Resp:   18   Height:      Weight:   146 lb 14.4 oz (66.633 kg)   SpO2:   97%     Intake/Output Summary (Last 24 hours) at 06/02/14 1012 Last data filed at 06/02/14 0936  Gross per 24 hour  Intake   1920 ml  Output    300 ml  Net   1620 ml   Filed Weights   06/01/14 0005 06/01/14 0601 06/02/14 0611  Weight: 150 lb 4.8 oz (68.176 kg) 150 lb 4.8 oz (68.176 kg) 146 lb 14.4 oz (66.633 kg)    PHYSICAL EXAM  General: Pleasant, NAD. Neuro: Alert and oriented X 3. Moves all extremities spontaneously. Psych: Normal affect. HEENT:  Normal  Neck: Supple without bruits or JVD. Lungs:  Resp regular and unlabored, CTA. Heart: RRR no s3, s4, or murmurs. Abdomen: Soft, non-tender, non-distended, BS + x 4.  Extremities: No clubbing, cyanosis or edema. DP/PT/Radials 2+ and equal bilaterally.  Accessory Clinical  Findings  CBC  Recent Labs  05/31/14 1924 06/01/14 0540  WBC 12.0* 10.4  HGB 15.5 14.5  HCT 45.9 45.4  MCV 84.7 86.3  PLT 231 938   Basic Metabolic Panel  Recent Labs  06/01/14 2016 06/02/14 0324  NA 133* 134*  K 4.6 4.1  CL 100* 101  CO2 24 23  GLUCOSE 95 90  BUN 19 15  CREATININE 1.13 0.88  CALCIUM 9.0 8.9  MG 2.1 1.9    Cardiac Enzymes  Recent Labs  06/01/14 0100 06/01/14 0540 06/01/14 1125  TROPONINI 0.10* 0.09* 0.08*   Fasting Lipid Panel  Recent Labs  06/01/14 0540  CHOL 133  HDL 35*  LDLCALC 78  TRIG 100  CHOLHDL 3.8    TELE  NSR now with freq PVCs and PACs.  Radiology/Studies  Dg Chest 2 View  05/31/2014   CLINICAL DATA:  Palpitations, upper chest pain  EXAM: CHEST  2 VIEW  COMPARISON:  Radiograph 04/21/2013  FINDINGS: Left-sided pacemaker overlies normal cardiac silhouette. The cardiac silhouette is upper limits of normal.  No effusion, infiltrate, pneumothorax. Degenerative osteophytosis of the thoracic spine.  IMPRESSION: Mild cardiomegaly without acute cardiopulmonary findings.   Electronically Signed   By: Suzy Bouchard M.D.   On: 05/31/2014 21:00    ASSESSMENT AND PLAN  [name] is a 74 y.o. 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   Atrial fib/flutter with RVR- he has converted into NSR now with freq PVCs and PACs. Difficult to tell may be still going in and out of afib. Rate controlled. -- Dr [name] put back on coreg 12.5mg  BID and added dig for augmented rate control -- AAD options limited to amiodarone vs tikosyn given his low EF. He has had issues with amiodarone with bradycardia in the past. Amiodarone lab pending. He was started on dofetilideyesterday with plans for DCCV on Thurday. He has now received 2 doses of dofetilide 279mcg. ECG  from this AM shows NSR w/ QTc 47ms. K 4.1 and Mag 1.9 -- His CHADS-VASC = 5 (CHF, HTN, Age, prior stroke, Vasc disease). Continue coumadin. INR 2.82.   Chest pain/Mild troponin elevation- likely demand ischemia in the setting of AF-RVR. No further chest pain.  -- 7 Beats NSVT on tele. He has ICD in place. Continue BB.  -- Troponin 0.10--> 0.09. Continue to monitor.  -- Continue ASA, BB, statin, ACEi, nitrate. He has not had an ischemic eval in some time. May want to update this at some point.   Chronic systolic CHF/Ischemic CM -- BNP mildly elevated at 648 but CXR clear -- Appears euvolemic on exam. Optival measures yesterday do not show increased impedence.  -- Continue BB and lisinopril and spiro 12.5 mg BID   Signed, THOMPSON, KATHRYN R PA-C  Converted overnight   i dont understand dofetilide dose at 250 i thuought  i had written for 500 CrCl   haave discussed with pharmacy and will resume 500  \anticpate discharge on Friday   Pager [phone]

## 2014-06-02 NOTE — Care Management Note (Signed)
Case Management Note  Patient Details  Name: [name] MRN: [id number] Date of Birth: 07/13/1940  Subjective/Objective:   Checked benefits for Tikosyn; med has a copay of $243!  Met with pt and daughter to discuss options:  Patient assistance program available through [employer], and pt is eligible to assistance.   Initiated patient assistance form: Need prescriber section of form completed ASAP to expedite enrollment process.  Pt's wife to bring in financial documentation to send in with application.  MD: please complete prescriber section of PAP form in pt's chart at the desk.   Pt's regular pharmacy, [facility], does not dispense Tikosyn.  Pt will be able to obtain med at [facility] on [address].                Action/Plan:   Expected Discharge Date:  06/03/14               Expected Discharge Plan:  Home/Self Care  In-House Referral:     Discharge planning Services  CM Consult, Medication Assistance  Post Acute Care Choice:  NA Choice offered to:     DME Arranged:    DME Agency:     HH Arranged:    HH Agency:     Status of Service:  In process, will continue to follow  Medicare Important Message Given:    Date Medicare IM Given:    Medicare IM give by:    Date Additional Medicare IM Given:    Additional Medicare Important Message give by:     If discussed at [place] of Stay Meetings, dates discussed:    Additional Comments:  Ella Bodo, RN 06/02/2014, 5:13 PM

## 2014-06-02 NOTE — Progress Notes (Signed)
Pt having PACs and Trigeminy. PA informed. No new orders given. Will continue to monitor patient for further changes in condition.

## 2014-06-03 DIAGNOSIS — F419 Anxiety disorder, unspecified: Secondary | ICD-10-CM

## 2014-06-03 LAB — AMIODARONE LEVEL
Amiodarone Lvl: NOT DETECTED ug/mL (ref 1.0–2.5)
N-DESETHYL-AMIODARONE: NOT DETECTED ug/mL (ref 1.0–2.5)

## 2014-06-03 LAB — BASIC METABOLIC PANEL
Anion gap: 7 (ref 5–15)
BUN: 11 mg/dL (ref 6–20)
CALCIUM: 9.2 mg/dL (ref 8.9–10.3)
CO2: 26 mmol/L (ref 22–32)
Chloride: 103 mmol/L (ref 101–111)
Creatinine, Ser: 0.97 mg/dL (ref 0.61–1.24)
GFR calc Af Amer: >60 mL/min (ref >60)
GFR calc non Af Amer: >60 mL/min (ref >60)
GLUCOSE: 80 mg/dL (ref 70–99)
Potassium: 4.3 mmol/L (ref 3.5–5.1)
SODIUM: 136 mmol/L (ref 135–145)

## 2014-06-03 LAB — MAGNESIUM: Magnesium: 1.9 mg/dL (ref 1.7–2.4)

## 2014-06-03 LAB — PROTIME-INR
INR: 2.99 — ABNORMAL HIGH (ref 0.00–1.49)
Prothrombin Time: 31.3 seconds — ABNORMAL HIGH (ref 11.6–15.2)

## 2014-06-03 MED ORDER — SODIUM CHLORIDE 0.9 % IV BOLUS (SEPSIS)
250.0000 mL | Freq: Once | INTRAVENOUS | Status: AC
  Administered 2014-06-03: 250 mL via INTRAVENOUS

## 2014-06-03 NOTE — Progress Notes (Signed)
Patient complained of feeling lightheaded. PA notified. BP 90/48 and HR is 52. New orders given. Will continue to monitor patient for further changes in condition.

## 2014-06-03 NOTE — Progress Notes (Signed)
Pt received 3rd dose of Tikosyn last night. K+ and Mag labs within defined limits today. ECG measurements obtained via tele strip prior to med administration per MD order and while patient in NSR. EKG obtained 3 hours after 3rd dose administered per MD order. Will continue to monitor.

## 2014-06-03 NOTE — Progress Notes (Signed)
[facility] for Coumadin Indication: atrial fibrillation  Allergies  Allergen Reactions  . Amoxicillin     REACTION: unspecified  . Penicillins     REACTION: unspecified    Patient Measurements: Height: 5\' 7"  (170.2 cm) Weight: 146 lb 8 oz (66.452 kg) (scale b) IBW/kg (Calculated) : 66.1  Vital Signs: Temp: 98.1 F (36.7 C) (05/05 0517) Temp Source: Oral (05/05 0517) BP: 98/52 mmHg (05/05 0942) Pulse Rate: 60 (05/05 0955)  Labs:  Recent Labs  05/31/14 1924 06/01/14 0100 06/01/14 0540 06/01/14 1125 06/01/14 2016 06/02/14 0324 06/03/14 0312  HGB 15.5  --  14.5  --   --   --   --   HCT 45.9  --  45.4  --   --   --   --   PLT 231  --  232  --   --   --   --   LABPROT  --   --  27.6*  --   --  29.9* 31.3*  INR  --   --  2.54*  --   --  2.82* 2.99*  CREATININE 0.91  --  0.93  --  1.13 0.88 0.97  TROPONINI  --  0.10* 0.09* 0.08*  --   --   --     Estimated Creatinine Clearance: 62.5 mL/min (by C-G formula based on Cr of 0.97).   Assessment: 65 YOM admitted with chest pain/palpitations, found to be in AFib with RVR. Patient on warfarin PTA for history of AFib- home dose is 5mg  on MWFSat and 2.5mg  on TTSun. Admission INR was 2.54. INR remains therapeutic at 2.99 Hgb and plts wnl- no bleeding noted.   Goal of Therapy:  INR 2-3 Monitor platelets by anticoagulation protocol: Yes   Plan:  -continue home warfarin- 5mg  on MWFSat and 2.5mg  TTSun -daily INR- if remain stable, can drop to 3x weekly -follow s/s bleeding and addition of interacting meds  Thanks for allowing pharmacy to be a part of this patient's care.  Excell Seltzer, PharmD Clinical Pharmacist, [phone]

## 2014-06-03 NOTE — Progress Notes (Addendum)
Patient Name: TUDOR CHANDLEY Date of Encounter: 06/03/2014     Active Problems:   Atrial fibrillation with rapid ventricular response    SUBJECTIVE   Very anxious .  er. Feeling well. Sometimes can feel his heart "skip a beat"   CURRENT MEDS . allopurinol  150 mg Oral Daily  . aspirin  81 mg Oral Daily  . carvedilol  12.5 mg Oral BID WC  . digoxin  0.125 mg Oral Daily  . dofetilide  500 mcg Oral Q12H  . isosorbide mononitrate  30 mg Oral Daily  . lisinopril  10 mg Oral BID  . montelukast  10 mg Oral QHS  . pantoprazole  40 mg Oral Daily  . simvastatin  40 mg Oral QHS  . sodium chloride  3 mL Intravenous Q12H  . spironolactone  12.5 mg Oral BID  . warfarin  2.5 mg Oral Once per day on Sun Tue Thu  . warfarin  5 mg Oral Once per day on Mon Wed Fri Sat  . Warfarin - Pharmacist Dosing Inpatient   Does not apply q1800    OBJECTIVE  Filed Vitals:   06/02/14 1730 06/02/14 1816 06/02/14 2028 06/03/14 0517  BP: 106/60 103/51 108/52 105/58  Pulse: 64 58 58 56  Temp:   98 F (36.7 C) 98.1 F (36.7 C)  TempSrc:   Oral Oral  Resp:  16 18 18   Height:      Weight:    146 lb 8 oz (66.452 kg)  SpO2:  95% 98% 98%    Intake/Output Summary (Last 24 hours) at 06/03/14 0754 Last data filed at 06/03/14 0100  Gross per 24 hour  Intake   2020 ml  Output   1075 ml  Net    945 ml   Filed Weights   06/01/14 0601 06/02/14 0611 06/03/14 0517  Weight: 150 lb 4.8 oz (68.176 kg) 146 lb 14.4 oz (66.633 kg) 146 lb 8 oz (66.452 kg)    PHYSICAL EXAM  General: Pleasant, NAD. Neuro: Alert and oriented X 3. Moves all extremities spontaneously. Psych: Normal affect. HEENT:  Normal  Neck: Supple without bruits or JVD. Lungs:  Resp regular and unlabored, CTA. Heart: RRR no s3, s4, or murmurs. Abdomen: Soft, non-tender, non-distended, BS + x 4.  Extremities: No clubbing, cyanosis or edema. DP/PT/Radials 2+ and equal bilaterally.  Accessory Clinical Findings  CBC  Recent Labs  05/31/14 1924 06/01/14 0540  WBC 12.0* 10.4  HGB 15.5 14.5  HCT 45.9 45.4  MCV 84.7 86.3  PLT 231 240   Basic Metabolic Panel  Recent Labs  06/02/14 0324 06/03/14 0312  NA 134* 136  K 4.1 4.3  CL 101 103  CO2 23 26  GLUCOSE 90 80  BUN 15 11  CREATININE 0.88 0.97  CALCIUM 8.9 9.2  MG 1.9 1.9    Cardiac Enzymes  Recent Labs  06/01/14 0100 06/01/14 0540 06/01/14 1125  TROPONINI 0.10* 0.09* 0.08*   Fasting Lipid Panel  Recent Labs  06/01/14 0540  CHOL 133  HDL 35*  LDLCALC 78  TRIG 100  CHOLHDL 3.8    TELE  NSR now with freq PVCs and PACs.  Radiology/Studies  Dg Chest 2 View  05/31/2014   CLINICAL DATA:  Palpitations, upper chest pain  EXAM: CHEST  2 VIEW  COMPARISON:  Radiograph 04/21/2013  FINDINGS: Left-sided pacemaker overlies normal cardiac silhouette. The cardiac silhouette is upper limits of normal. No effusion, infiltrate, pneumothorax. Degenerative osteophytosis of the thoracic spine.  IMPRESSION: Mild cardiomegaly without acute cardiopulmonary findings.   Electronically Signed   By: Suzy Bouchard M.D.   On: 05/31/2014 21:00    ASSESSMENT AND PLAN  BRODIN GELPI is a 74 y.o. 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   Atrial fib/flutter with RVR- he has converted into NSR   Back on dig and   Chest pain/Mild troponin elevation- likely demand ischemia in the setting of AF-RVR. No further chest pain.  nt.   Chronic systolic CHF/Ischemic CM -- BNP mildly elevated at 648 but CXR clear -- Appears euvolemic on exam.   Anxiety  Issues reviewed and tried to reassure regarding dofetilde safety and reality of recurrence of afib   anticpate discharge in am  Will continue dig  Dig level at [facility] [place] clinic followup  Roderic Palau 3-4 weeks and has scheduled  fu with me already  Will discuss at that time referral for PVI

## 2014-06-04 LAB — CUP PACEART REMOTE DEVICE CHECK
Brady Statistic RV Percent Paced: 0.22 %
Date Time Interrogation Session: 20160502041608
HighPow Impedance: 54 Ohm
HighPow Impedance: 78 Ohm
Lead Channel Impedance Value: 1120 Ohm
Lead Channel Setting Pacing Amplitude: 2.5 V
Lead Channel Setting Pacing Pulse Width: 0.4 ms
Lead Channel Setting Sensing Sensitivity: 0.3 mV
MDC IDC MSMT BATTERY VOLTAGE: 2.97 V
MDC IDC MSMT LEADCHNL RV SENSING INTR AMPL: 15.642 mV
MDC IDC SET ZONE DETECTION INTERVAL: 300 ms
MDC IDC SET ZONE DETECTION INTERVAL: 340 ms
Zone Setting Detection Interval: 240 ms
Zone Setting Detection Interval: 450 ms

## 2014-06-04 LAB — BASIC METABOLIC PANEL
Anion gap: 10 (ref 5–15)
BUN: 11 mg/dL (ref 6–20)
CO2: 25 mmol/L (ref 22–32)
CREATININE: 0.91 mg/dL (ref 0.61–1.24)
Calcium: 9 mg/dL (ref 8.9–10.3)
Chloride: 102 mmol/L (ref 101–111)
GFR calc Af Amer: >60 mL/min (ref >60)
GLUCOSE: 81 mg/dL (ref 70–99)
Potassium: 4.1 mmol/L (ref 3.5–5.1)
Sodium: 137 mmol/L (ref 135–145)

## 2014-06-04 LAB — PROTIME-INR
INR: 3.06 — ABNORMAL HIGH (ref 0.00–1.49)
PROTHROMBIN TIME: 31.8 s — AB (ref 11.6–15.2)

## 2014-06-04 LAB — MAGNESIUM: MAGNESIUM: 1.9 mg/dL (ref 1.7–2.4)

## 2014-06-04 MED ORDER — DOFETILIDE 500 MCG PO CAPS: 500.0000 ug | ORAL_CAPSULE | Freq: Two times a day (BID) | ORAL | Status: DC

## 2014-06-04 MED ORDER — DIGOXIN 125 MCG PO TABS: 0.1250 mg | ORAL_TABLET | Freq: Every day | ORAL | Status: DC

## 2014-06-04 MED ORDER — SPIRONOLACTONE 25 MG PO TABS: 12.5000 mg | ORAL_TABLET | Freq: Every day | ORAL | Status: DC

## 2014-06-04 MED ORDER — ASPIRIN 81 MG PO CHEW: 81.0000 mg | CHEWABLE_TABLET | Freq: Every day | ORAL | Status: DC

## 2014-06-04 MED ORDER — CARVEDILOL 6.25 MG PO TABS
6.2500 mg | ORAL_TABLET | Freq: Two times a day (BID) | ORAL | Status: DC
  Administered 2014-06-04: 6.25 mg via ORAL
  Filled 2014-06-04 (×3): qty 1

## 2014-06-04 MED ORDER — WARFARIN SODIUM 5 MG PO TABS: 5.0000 mg | ORAL_TABLET | ORAL | Status: DC

## 2014-06-04 MED ORDER — SPIRONOLACTONE 12.5 MG HALF TABLET
12.5000 mg | ORAL_TABLET | Freq: Every day | ORAL | Status: DC
  Administered 2014-06-04: 12.5 mg via ORAL
  Filled 2014-06-04: qty 1

## 2014-06-04 MED ORDER — WARFARIN SODIUM 2.5 MG PO TABS
2.5000 mg | ORAL_TABLET | ORAL | Status: DC
  Filled 2014-06-04: qty 1

## 2014-06-04 MED ORDER — CARVEDILOL 6.25 MG PO TABS: 6.2500 mg | ORAL_TABLET | Freq: Two times a day (BID) | ORAL | Status: DC

## 2014-06-04 NOTE — Progress Notes (Signed)
Pt d/c off floor at 1409 after d/c instructions given and explained.  Verbalized understanding. Karie Kirks, Therapist, sports.

## 2014-06-04 NOTE — Discharge Summary (Signed)
Physician Discharge Summary      Cardiologist:  [name]   Patient ID: [name] MRN: [id number] DOB/AGE: Nov 13, 1940 74 y.o.  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 He reports that around 3:30AM on 5/2 he awoke with heart palpitations that were consistent with his prior episodes of AF. This time during the course of the day, however, his palpitations were associated with mild chest discomfort. Symptoms came on at rest and persisted all day. He reports that although he has had a long history of PAF he has been suffering more episodes recently.  Given that his symptoms did not resolve he presented to the ED around 6-7pm. Prior to that he took an extra dose of carvedilol 25mg  but this caused his BP to fall in the SBPs 85mmHg transiently with some lightheadedness and tunnel vision. This resolved spontaneously.  In the ED, his EKG was concerning for coarse AF vs AFL with variable block.  Of note, he has had a long history of PAF but becomes quite symptomatic when he is not in SR. He has tried amiodarone in the distant past. There have been no other attempts at rhythm control. He does not drink caffeine or EtOH. He does not have a history of OSA.    The patient was admitted.  CHADS-VASC = 5.  He is on coumadin.  On ASA, metoprolol, statin, ACE-I.   He was started on tikosyn.  Digoxin added to augment rate control. He converted to SR.  Mild troponin elevation was likely from demand ischemia.  BNP was 648 but euvolemic on exam.  Some hypotension.  Aldactone decreased.  BB decreased to help increase  HR.  The patient was seen by Dr. [name] who felt he was stable for DC home.  All FU appts scheduled.  Will discuss at that time referral for PVI.  Coumadin check on the 20th.     FU needs: Digoxin level  Consults:EP  Significant Diagnostic Studies: CHEST 2 VIEW  COMPARISON: Radiograph 04/21/2013  FINDINGS: Left-sided pacemaker overlies normal cardiac silhouette. The cardiac silhouette is upper limits of normal. No effusion, infiltrate, pneumothorax. Degenerative osteophytosis of the thoracic spine.  IMPRESSION: Mild cardiomegaly without acute cardiopulmonary findings.  Treatments: Tikosyn  Discharge Exam: Blood pressure 108/56, pulse 54, temperature 97.9 F (36.6 C), temperature source Oral, resp. rate 18, height 5\' 7"  (1.702 m), weight 146 lb 3.2 oz (66.316 kg), SpO2 98 %.   Disposition: 01-Home or Self Care      Discharge Instructions    Diet - low sodium heart healthy    Complete by:  As directed      Increase activity slowly    Complete by:  As directed             Medication List    TAKE these medications        acetaminophen 650 MG CR tablet  Commonly known as:  TYLENOL  Take 650 mg by mouth 2 (two) times daily.     allopurinol 300 MG tablet  Commonly known as:  ZYLOPRIM  Take 150 mg by mouth daily.  aspirin 81 MG chewable tablet  Chew 1 tablet (81 mg total) by mouth daily.     carvedilol 6.25 MG tablet  Commonly known as:  COREG  Take 1 tablet (6.25 mg total) by mouth 2 (two) times daily with a meal.     cetirizine 10 MG tablet  Commonly known as:  ZYRTEC  Take 10 mg by mouth daily.     digoxin 0.125 MG tablet  Commonly known as:  LANOXIN  Take 1 tablet (0.125 mg total) by mouth daily.     dofetilide 500 MCG capsule  Commonly known as:  TIKOSYN  Take 1 capsule (500 mcg total) by mouth every 12 (twelve) hours.     esomeprazole 40 MG capsule  Commonly known as:  NEXIUM  Take 1 capsule (40 mg total) by mouth daily.      glycopyrrolate 2 MG tablet  Commonly known as:  ROBINUL  Take 1 tablet (2 mg total) by mouth 2 (two) times daily.     hydrocortisone 25 MG suppository  Commonly known as:  ANUSOL-HC  Place 1 suppository (25 mg total) rectally at bedtime. For 10 days     Hyoscyamine Sulfate 0.375 MG Tbcr  Take 1 tablet (0.375 mg total) by mouth 2 (two) times daily as needed.     isosorbide mononitrate 30 MG 24 hr tablet  Commonly known as:  IMDUR  TAKE ONE-HALF TO ONE TABLET BY MOUTH ONCE DAILY     lisinopril 20 MG tablet  Commonly known as:  PRINIVIL,ZESTRIL  Take 0.5 tablets (10 mg total) by mouth 2 (two) times daily.     magnesium oxide 400 MG tablet  Commonly known as:  MAG-OX  Take 400 mg by mouth daily.     mometasone 220 MCG/INH inhaler  Commonly known as:  ASMANEX  Inhale 1 puff into the lungs daily.     mometasone 50 MCG/ACT nasal spray  Commonly known as:  NASONEX  Place 2 sprays into the nose daily as needed.     montelukast 10 MG tablet  Commonly known as:  SINGULAIR  Take 1 tablet (10 mg total) by mouth at bedtime.     nitroGLYCERIN 0.4 MG SL tablet  Commonly known as:  NITROSTAT  Place 0.4 mg under the tongue every 5 (five) minutes as needed for chest pain.     silodosin 4 MG Caps capsule  Commonly known as:  RAPAFLO  Take 8 mg by mouth. Every 2 days     simvastatin 40 MG tablet  Commonly known as:  ZOCOR  Take 1 tablet (40 mg total) by mouth at bedtime.     spironolactone 25 MG tablet  Commonly known as:  ALDACTONE  Take 0.5 tablets (12.5 mg total) by mouth daily.     Vitamin D 2000 UNITS Caps  Take 1 capsule by mouth daily.     warfarin 5 MG tablet  Commonly known as:  COUMADIN  Take 1 tablet by mouth daily or as directed by coumadin clinic       Follow-up Information    Follow up with CARROLL,DONNA, NP On 06/08/2014.   Specialty:  Nurse Practitioner   Why:  3:30 PM   Contact information:   [place] [address] [phone]       Follow  up with Troy Sine, MD On 07/06/2014.   Specialty:  Cardiology   Why:  10:15 AM   Contact information:   [address] [place] [address] [phone]  Follow up with Coumadin Clinic On 06/18/2014.   Why:  10:10 AM    Contact information:   [address] [place] [address] [phone]      Follow up with Virl Axe, MD On 08/26/2014.   Specialty:  Cardiology   Why:  9:30 AM   Contact information:   [address] [place]  [address] [phone]      Greater than 30 minutes was spent completing the patient's discharge.    SignedTarri Fuller, [place] 06/04/2014, 9:32 AM

## 2014-06-04 NOTE — Consult Note (Signed)
   [facility] CM Inpatient Consult   06/04/2014  [name] Aug 20, 1940 [id number] Referral received.  Patient evaluated for community based chronic disease management services with [facility] Program as a benefit of patient's [employer]. Spoke with patient and his wife, [name], at bedside to explain [facility] Management services.  Patient will be started on new medications at discharge.  Patient and wife would like to be followed by care management for medication follow up and for possible home visits.   Patient will receive post discharge calls and will be evaluated for monthly home visits for assessments and disease process education. Consent form signed and [facility] Management folder given.  Made Inpatient Case Manager aware that [name] following. Of note, [facility] Care Management services does not replace or interfere with any services that are arranged by inpatient case management or social work.  For additional questions or referrals please contact:   Natividad Brood, RN BSN [facility] Liaison  [phone] business mobile phone

## 2014-06-04 NOTE — Patient Outreach (Signed)
[facility]) Care Management  06/04/2014  ALEXANDRA POSADAS 03-26-1940 [id number]   Assigned Rhys Martini, RN and Deanne Coffer, PharmD based on referral from inpatient status.  [name]. [place] CM Assistant Phone: [phone] Fax: [phone]

## 2014-06-04 NOTE — Progress Notes (Signed)
[facility] for Coumadin Indication: atrial fibrillation  Allergies  Allergen Reactions  . Amoxicillin     REACTION: unspecified  . Penicillins     REACTION: unspecified    Patient Measurements: Height: 5\' 7"  (170.2 cm) Weight: 146 lb 3.2 oz (66.316 kg) (scale b) IBW/kg (Calculated) : 66.1  Vital Signs: Temp: 97.9 F (36.6 C) (05/06 0522) Temp Source: Oral (05/06 0522) BP: 108/56 mmHg (05/06 0522) Pulse Rate: 54 (05/06 0522)  Labs:  Recent Labs  06/01/14 1125  06/02/14 0324 06/03/14 0312 06/04/14 0318  LABPROT  --   --  29.9* 31.3* 31.8*  INR  --   --  2.82* 2.99* 3.06*  CREATININE  --   < > 0.88 0.97 0.91  TROPONINI 0.08*  --   --   --   --   < > = values in this interval not displayed.  Estimated Creatinine Clearance: 66.6 mL/min (by C-G formula based on Cr of 0.91).   Assessment: 18 YOM admitted with chest pain/palpitations, found to be in AFib with RVR. Patient on warfarin PTA for history of AFib- home dose is 5mg  on MWFSat and 2.5mg  on TTSun. Admission INR was 2.54. INR slightly supratherapeutic today 3.06 Hgb and plts wnl- no bleeding noted.   Goal of Therapy:  INR 2-3 Monitor platelets by anticoagulation protocol: Yes   Plan:  -Coumaidn 2.5 mg today then continue home warfarin- 5mg  on MWFSat and 2.5mg  TTSun -follow s/s bleeding and addition of interacting meds  Thanks for allowing pharmacy to be a part of this patient's care.  Excell Seltzer, PharmD Clinical Pharmacist, [phone]

## 2014-06-04 NOTE — Progress Notes (Signed)
Pt for dc home today with spouse.  Completed and faxed application for [employer] for Tikosyn assistance.  Pt will have one week supply of Tikosyn upon dc.   THN to follow up at dc--Will check status of Patient Assistance Program.  Contact # for PAP is [phone].    Pt and wife very appreciative of all help given.    Lionel December, RN, BSN Phone [phone]

## 2014-06-04 NOTE — Progress Notes (Addendum)
Patient Name: NESHAWN AIRD Date of Encounter: 06/04/2014     Active Problems:   Atrial fibrillation with rapid ventricular response    SUBJECTIVE   Very anxious .  er. Feeling well. Sometimes can feel his heart "skip a beat"   CURRENT MEDS . allopurinol  150 mg Oral Daily  . aspirin  81 mg Oral Daily  . carvedilol  12.5 mg Oral BID WC  . digoxin  0.125 mg Oral Daily  . dofetilide  500 mcg Oral Q12H  . isosorbide mononitrate  30 mg Oral Daily  . lisinopril  10 mg Oral BID  . montelukast  10 mg Oral QHS  . pantoprazole  40 mg Oral Daily  . simvastatin  40 mg Oral QHS  . sodium chloride  3 mL Intravenous Q12H  . spironolactone  12.5 mg Oral BID  . warfarin  2.5 mg Oral Once per day on Sun Tue Thu  . warfarin  5 mg Oral Once per day on Mon Wed Fri Sat  . Warfarin - Pharmacist Dosing Inpatient   Does not apply q1800    OBJECTIVE  Filed Vitals:   06/03/14 1848 06/03/14 1959 06/03/14 2148 06/04/14 0522  BP: 115/61 120/68 111/54 108/56  Pulse: 64 68 61 54  Temp:  97.4 F (36.3 C)  97.9 F (36.6 C)  TempSrc:  Oral  Oral  Resp:  18  18  Height:      Weight:    146 lb 3.2 oz (66.316 kg)  SpO2:  98% 97% 98%    Intake/Output Summary (Last 24 hours) at 06/04/14 0722 Last data filed at 06/04/14 0500  Gross per 24 hour  Intake 1791.67 ml  Output   1725 ml  Net  66.67 ml   Filed Weights   06/02/14 0611 06/03/14 0517 06/04/14 0522  Weight: 146 lb 14.4 oz (66.633 kg) 146 lb 8 oz (66.452 kg) 146 lb 3.2 oz (66.316 kg)    PHYSICAL EXAM  General: Pleasant, NAD. Neuro: Alert and oriented X 3. Moves all extremities spontaneously. Psych: Normal affect. HEENT:  Normal  Neck: Supple without bruits or JVD. Lungs:  Resp regular and unlabored, CTA. Heart: RRR no s3, s4, or murmurs. Abdomen: Soft, non-tender, non-distended, BS + x 4.  Extremities: No clubbing, cyanosis or edema. DP/PT/Radials 2+ and equal bilaterally.  Tel  Sinus with some ventrciular and junctional  escape beats with bradycardia and PVCs  ecgs reviewed prior to dofetilide also with vVEA  CBC No results for input(s): WBC, NEUTROABS, HGB, HCT, MCV, PLT in the last 72 hours. Basic Metabolic Panel  Recent Labs  06/03/14 0312 06/04/14 0318  NA 136 137  K 4.3 4.1  CL 103 102  CO2 26 25  GLUCOSE 80 81  BUN 11 11  CREATININE 0.97 0.91  CALCIUM 9.2 9.0  MG 1.9 1.9    Cardiac Enzymes  Recent Labs  06/01/14 1125  TROPONINI 0.08*   Fasting Lipid Panel No results for input(s): CHOL, HDL, LDLCALC, TRIG, CHOLHDL, LDLDIRECT in the last 72 hours.  TELE  NSR now with freq PVCs and PACs.  Radiology/Studies  Dg Chest 2 View  05/31/2014   CLINICAL DATA:  Palpitations, upper chest pain  EXAM: CHEST  2 VIEW  COMPARISON:  Radiograph 04/21/2013  FINDINGS: Left-sided pacemaker overlies normal cardiac silhouette. The cardiac silhouette is upper limits of normal. No effusion, infiltrate, pneumothorax. Degenerative osteophytosis of the thoracic spine.  IMPRESSION: Mild cardiomegaly without acute cardiopulmonary findings.   Electronically Signed  By: Suzy Bouchard M.D.   On: 05/31/2014 21:00    ASSESSMENT AND PLAN  LEELYNN WHETSEL is a 74 y.o. 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   Atrial fib/flutter with RVR   Chest pain/Mild troponin elevation-    Chronic systolic CHF/Ischemic CM  Hypotension  Anxiety  Ventricualr ectopy   Old ECGs reviewed >> Ectopy present  Will decrase BB to hopefully increase sinus rate and accomplish some over drive suppression   Issues reviewed and tried to reassure regarding dofetilde safety and reality of recurrence of afib  Discharge today  Will continue dig  Dig level at [name] followup  Roderic Palau 3-4 weeks and has scheduled fu with me  already  Will discuss at that time referral for PVI   Will decrease Aldacatone given recurrent episodes of lightheaded ness  Will leave ICD programmed as revised

## 2014-06-06 ENCOUNTER — Telehealth: Payer: Self-pay | Admitting: Physician Assistant

## 2014-06-06 NOTE — Telephone Encounter (Signed)
The patient is on several new medications and was concerned about his blood pressure readings.  I reviewed his blood pressure readings. His systolic is consistently between 110 and 130. His diastolic is consistently greater than 50. He is asymptomatic.  I reassured him that his blood pressure readings were normal. I advised him that if he felt like he was having symptoms related to low blood pressure he should give [name] a call immediately. I advised him that if he felt like he was having any chest pain or palpitations he should also contact [name] immediately. Mr. [name] him stated that he was otherwise doing well and was just concerned because so many of his medications had changed. I reassured him that there is no reason to change anything at this time and he should continue to monitor his symptoms and his blood pressure and contact [name] if there are any changes. He was in agreement with this as a plan of care.

## 2014-06-07 ENCOUNTER — Telehealth: Payer: Self-pay | Admitting: Cardiovascular Disease

## 2014-06-07 NOTE — Telephone Encounter (Signed)
No answer when dialed. No answering machine pickup.

## 2014-06-07 NOTE — Telephone Encounter (Signed)
Pt. Called and he stated he was having some dizziness and would like [name] to be informed about this, staff message sent to [name]

## 2014-06-07 NOTE — Telephone Encounter (Signed)
Staff message  Sent to [name] about pt.s dizziness

## 2014-06-07 NOTE — Telephone Encounter (Signed)
Needs a TOC phone Call. Thanks  °

## 2014-06-08 ENCOUNTER — Ambulatory Visit (HOSPITAL_COMMUNITY): Payer: Medicare Other | Admitting: Nurse Practitioner

## 2014-06-08 ENCOUNTER — Other Ambulatory Visit: Payer: Self-pay | Admitting: *Deleted

## 2014-06-08 ENCOUNTER — Other Ambulatory Visit: Payer: Self-pay | Admitting: Cardiology

## 2014-06-08 NOTE — Telephone Encounter (Signed)
Patient contacted regarding discharge from [facility] on 06/04/14  Patient understands to follow up with provider donna carroll on 06/10/14 at 3 pm at atrial fib clinic Patient understands discharge instructions? yes Patient understands medications and regiment? yes Patient understands to bring all medications to this visit? yes  Patient is concerned because his heart rate is running 40-50's and he is having occ lightheadedness.  He reports his bp is fine. He did not take the carvedilol last night and his pulse is 54 this am. Discussed with dr jordan(DOD), patient instructed to stop digoxin. Patient voiced understanding of medication changes

## 2014-06-08 NOTE — Progress Notes (Signed)
This encounter was created in error - please disregard.

## 2014-06-08 NOTE — Patient Outreach (Signed)
[facility]) Care Management  06/08/2014  [name] 1940-05-28 [id number]   RN spoke with pt concerning [facility] services and available programs. Discussed post discharge orders and verified pt has been adherent with all his medications. Pt states he has a follow up appointment with his cardiologist and will contact his primary concerning his recent discharge. Discussed pt's issues with medication and offered pharmacy assistance via [facility] (referral competed for pharmacy). Pt has agreed to [facility] services and an initial  home visit has been arranged for next week. RN will follow up accordingly to pt's availability. No additional inquires or request at this time as pt very grateful and RN provided a contact if needed prior ot the scheduled appointment.   [name], RN Care Management Coordinator [facility] Main Office [phone]

## 2014-06-08 NOTE — Addendum Note (Signed)
Addended by: [name] D on: 06/08/2014 02:09 PM   Modules accepted: Level of Service, SmartSet

## 2014-06-08 NOTE — Telephone Encounter (Signed)
Rx(s) sent to pharmacy electronically.  

## 2014-06-08 NOTE — Addendum Note (Signed)
Addended by: Cristopher Estimable on: 06/08/2014 08:40 AM   Modules accepted: Orders, Medications

## 2014-06-09 ENCOUNTER — Other Ambulatory Visit: Payer: Self-pay | Admitting: Pharmacist

## 2014-06-09 NOTE — Patient Outreach (Signed)
[facility]) Care Management  [place]   06/09/2014  TIMATHY NEWBERRY 11-16-1940 [id number]  Subjective: [name] is a 74 y.o. male referred for pharmacy for Tikosyn application follow up with medication company.   Notified patient that I called [employer] to determine status of the patient's application to receive Tikosyn through the company. [employer] does not have the application updated in the system as of yet, but advised that I call back on 06/11/14. Patient verbalized understanding and asked about next steps. Let patient know that [employer] representative stated that if the application is approved, the medication will be shipped to his physician's office.   Mr. Paxton reports that he takes his Tikosyn twice daily, with breakfast and supper. Reports that he keeps a list of his medications on his dining table and checks that he takes each dose. Reports that he has not missed any doses.  Patient reports that he has had some lightheadedness since starting Tikosyn, but that this has improved since he was directed to stop taking Digoxin by [name]. Reports that he does monitor his own blood pressure and pulse. Counseled patient that he should remember to get up slowly and use the arms of his chair for support. If he is feeling lightheaded, he should sit or lie down. Advised patient that if he begins to have episodes of lightheadedness again, he should call Dr. Evette Georges office again.  Patient reports that he has no other questions about his medications at this time.  Objective:   Current Medications: Current Outpatient Prescriptions  Medication Sig Dispense Refill  . acetaminophen (TYLENOL) 650 MG CR tablet Take 650 mg by mouth 2 (two) times daily.     Marland Kitchen allopurinol (ZYLOPRIM) 300 MG tablet Take 150 mg by mouth daily.     Marland Kitchen aspirin 81 MG chewable tablet Chew 1 tablet (81 mg total) by mouth daily.    . carvedilol (COREG) 6.25 MG tablet Take 1 tablet (6.25 mg  total) by mouth 2 (two) times daily with a meal. 60 tablet 11  . cetirizine (ZYRTEC) 10 MG tablet Take 10 mg by mouth daily.      . Cholecalciferol (VITAMIN D) 2000 UNITS CAPS Take 1 capsule by mouth daily.    Marland Kitchen dofetilide (TIKOSYN) 500 MCG capsule Take 1 capsule (500 mcg total) by mouth every 12 (twelve) hours. 14 capsule 0  . esomeprazole (NEXIUM) 40 MG capsule Take 1 capsule (40 mg total) by mouth daily. 30 capsule 11  . glycopyrrolate (ROBINUL) 2 MG tablet Take 1 tablet (2 mg total) by mouth 2 (two) times daily. 60 tablet 3  . hydrocortisone (ANUSOL-HC) 25 MG suppository Place 1 suppository (25 mg total) rectally at bedtime. For 10 days 10 suppository 0  . Hyoscyamine Sulfate 0.375 MG TBCR Take 1 tablet (0.375 mg total) by mouth 2 (two) times daily as needed. 60 tablet 1  . isosorbide mononitrate (IMDUR) 30 MG 24 hr tablet TAKE ONE-HALF TO ONE TABLET BY MOUTH ONCE DAILY 90 tablet 2  . lisinopril (PRINIVIL,ZESTRIL) 20 MG tablet Take 0.5 tablets (10 mg total) by mouth 2 (two) times daily. 90 tablet 3  . magnesium oxide (MAG-OX) 400 MG tablet Take 400 mg by mouth daily.    . mometasone (ASMANEX) 220 MCG/INH inhaler Inhale 1 puff into the lungs daily.     . mometasone (NASONEX) 50 MCG/ACT nasal spray Place 2 sprays into the nose daily as needed.     . montelukast (SINGULAIR) 10 MG  tablet Take 1 tablet (10 mg total) by mouth at bedtime. 90 tablet 3  . nitroGLYCERIN (NITROSTAT) 0.4 MG SL tablet Place 0.4 mg under the tongue every 5 (five) minutes as needed for chest pain.    . silodosin (RAPAFLO) 4 MG CAPS capsule Take 8 mg by mouth. Every 2 days    . simvastatin (ZOCOR) 40 MG tablet TAKE ONE TABLET BY MOUTH AT BEDTIME 90 tablet 3  . spironolactone (ALDACTONE) 25 MG tablet Take 0.5 tablets (12.5 mg total) by mouth daily. 30 tablet 11  . warfarin (COUMADIN) 5 MG tablet Take 1 tablet by mouth daily or as directed by coumadin clinic (Patient taking differently: Take 2.5-5 mg by mouth daily. Take 5mg  on  Mon /Wed / Fri / Sat Take 2.5mg  on Sun / Tues / Thur) 90 tablet 1   No current facility-administered medications for this visit.    Functional Status: In your present state of health, do you have any difficulty performing the following activities: 06/01/2014 05/31/2014  Hearing? - N  Vision? - Y  Difficulty concentrating or making decisions? - N  Walking or climbing stairs? - N  Dressing or bathing? - N  Doing errands, shopping? N -    Fall/Depression Screening: No flowsheet data found.  Assessment:  Patient currently has a 30 day supply of Tikosyn at home, which should provide him enough medication until a determination is made by [employer] as to whether they will provide the medicine.   Plan: Provided patient with my contact information.  Will follow up with [employer] on 06/11/14 to determine status of the patient's application to receive Tikosyn through the company and then follow up with the patient.  Harlow Asa, PharmD Clinical Pharmacist [place] Management [phone]

## 2014-06-09 NOTE — Patient Outreach (Signed)
Called [employer] at [phone] to determine status of the patient's application to receive Tikosyn through the company. [employer] does not have the application updated in the system as of yet, but advised that I call back on 06/11/14.   Will call Mr. Sawin to update him and discuss further questions that he might have.    Harlow Asa, PharmD Clinical Pharmacist [place] Management [phone]

## 2014-06-09 NOTE — Patient Outreach (Signed)
Received referral for pharmacy follow up due to getting Tikosyn application follow up with medication company. [place], [facility] Liaison for more information about the application and when it was completed. Per [place], application was completed and sent at discharge on 06/04/2014. Patient was sent home with a one week supply. Reports that patient also had questions about interactions with this new medication.   Phone number for [facility] located in [place] note from patient's discharge date from Ellan Lambert, Case Manager.  Will call [employer] at [phone] to determine status of the patient's application and then follow up with Mr. Blue.   Harlow Asa, PharmD Clinical Pharmacist [place] Management [phone]

## 2014-06-10 ENCOUNTER — Encounter (HOSPITAL_COMMUNITY): Payer: Self-pay | Admitting: Nurse Practitioner

## 2014-06-10 ENCOUNTER — Ambulatory Visit (HOSPITAL_COMMUNITY)
Admission: RE | Admit: 2014-06-10 | Discharge: 2014-06-10 | Disposition: A | Discharge status: Home / Self Care | Payer: Medicare Other | Source: Ambulatory Visit | Attending: Nurse Practitioner | Admitting: Nurse Practitioner

## 2014-06-10 VITALS — BP 118/70 | HR 60 | Ht 67.0 in | Wt 153.2 lb

## 2014-06-10 DIAGNOSIS — J3089 Other allergic rhinitis: Secondary | ICD-10-CM | POA: Diagnosis not present

## 2014-06-10 DIAGNOSIS — I481 Persistent atrial fibrillation: Secondary | ICD-10-CM | POA: Insufficient documentation

## 2014-06-10 DIAGNOSIS — I4819 Other persistent atrial fibrillation: Secondary | ICD-10-CM

## 2014-06-10 NOTE — Progress Notes (Signed)
Patient ID: [name], male   DOB: 10/07/1940, 74 y.o.   MRN: [id number]   Primary Care Physician: [name], MD Referring Physician: Dr. Mila Merry f/u   [name] is a 74 y.o. 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 He reports that around 3:30AM on 5/2 he awoke with heart palpitations that were consistent with his prior episodes of AF. This time during the course of the day, however, his palpitations were associated with mild chest discomfort. Symptoms came on at rest and persisted all day. He reports that although he has had a long history of PAF he has been suffering more episodes recently. Given that his symptoms did not resolve he presented to the ED around 6-7pm. Prior to that he took an extra dose of carvedilol 25mg  but this caused his BP to fall in the SBPs 40mmHg transiently with some lightheadedness and tunnel vision. This resolved spontaneously. In the ED, his EKG was concerning for coarse AF vs AFL with variable block. Of note, he has had a long history of PAF but becomes quite symptomatic when he is not in SR. He has tried amiodarone in the distant past. There have been no other attempts at rhythm control. He does not drink caffeine or EtOH. He does not have a history of OSA.  The patient was admitted. CHADS-VASC = 5. He is on coumadin. On ASA, metoprolol, statin, ACE-I. He was started on tikosyn. Digoxin added to augment rate control. He converted to SR. Mild troponin elevation was likely from demand ischemia. BNP was 648 but euvolemic on exam. Some hypotension. Aldactone decreased. BB decreased to help increase HR. The patient was seen by Dr. [name] who felt he was stable for DC home.  After discharge, heart rate dropped into the 40's and he was instructed to stop digoxin. He expresses   Concern today re  cost of tikosyn but this issue is being addressed for him and he should hear soon if able to better afford. No sensation of palpitations,chest pain or shortness of breath. Being complaint with tikosyn and is aware of importance to take on a regular basis without missed doses.    Today, he denies symptoms of palpitations, chest pain, shortness of breath, orthopnea, PND, lower extremity edema, dizziness, presyncope, syncope, or neurologic sequela. The patient is tolerating medications without difficulties and is otherwise without complaint today.   Past Medical History  Diagnosis Date  . Coronary artery disease     Hx MI 1992, CABG 1998 , Nuc study 11.2013 large scar but no ischemia  . 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 Atrial fib/flutter, transient   . GERD (gastroesophageal reflux disease)   . H. pylori infection     Hx of   . Cardiomyopathy, ischemic 2011    with EF 25-35% by echo  . H/O myocardial infarction, greater than 8 weeks 1992    large ant wall injury  . NSVT (nonsustained ventricular tachycardia)    Past Surgical History  Procedure Laterality Date  . Nasal sinus surgery  05/31/2010    Dr. Benjamine Mola  . Coronary artery bypass graft  1998    x 5  . Cataract extraction, bilateral    . Knee surgery      right  . Hernia repair    . Icd generator change  01/2008    medtronic, hx+ EP study    Current Outpatient Prescriptions  Medication Sig Dispense Refill  . acetaminophen (TYLENOL) 650 MG CR tablet Take 650 mg by mouth 2 (two) times daily.     Marland Kitchen aspirin 81 MG chewable tablet Chew 1 tablet (81 mg total) by mouth daily.    . carvedilol (COREG) 6.25 MG tablet Take 1 tablet (6.25 mg total) by mouth 2 (two) times daily with a meal. 60 tablet 11  . cetirizine (ZYRTEC) 10 MG tablet Take 10 mg by mouth daily.      . Cholecalciferol (VITAMIN D) 2000 UNITS CAPS Take 1 capsule by mouth daily.    Marland Kitchen dofetilide (TIKOSYN) 500 MCG capsule  Take 1 capsule (500 mcg total) by mouth every 12 (twelve) hours. 14 capsule 0  . esomeprazole (NEXIUM) 40 MG capsule Take 1 capsule (40 mg total) by mouth daily. 30 capsule 11  . Hyoscyamine Sulfate 0.375 MG TBCR Take 1 tablet (0.375 mg total) by mouth 2 (two) times daily as needed. 60 tablet 1  . isosorbide mononitrate (IMDUR) 30 MG 24 hr tablet TAKE ONE-HALF TO ONE TABLET BY MOUTH ONCE DAILY 90 tablet 2  . lisinopril (PRINIVIL,ZESTRIL) 20 MG tablet Take 0.5 tablets (10 mg total) by mouth 2 (two) times daily. 90 tablet 3  . magnesium oxide (MAG-OX) 400 MG tablet Take 400 mg by mouth daily.    . mometasone (ASMANEX) 220 MCG/INH inhaler Inhale 1 puff into the lungs daily.     . mometasone (NASONEX) 50 MCG/ACT nasal spray Place 2 sprays into the nose daily as needed.     . montelukast (SINGULAIR) 10 MG tablet Take 1 tablet (10 mg total) by mouth at bedtime. 90 tablet 3  . silodosin (RAPAFLO) 4 MG CAPS capsule Take 8 mg by mouth. Every 2 days    . simvastatin (ZOCOR) 40 MG tablet TAKE ONE TABLET BY MOUTH AT BEDTIME 90 tablet 3  . spironolactone (ALDACTONE) 25 MG tablet Take 0.5 tablets (12.5 mg total) by mouth daily. 30 tablet 11  . warfarin (COUMADIN) 5 MG tablet Take 1 tablet by mouth daily or as directed by coumadin clinic (Patient taking differently: Take 2.5-5 mg by mouth daily. Take 5mg  on Mon /Wed / Fri / Sat Take 2.5mg  on Sun / Tues / Thur) 90 tablet 1  . allopurinol (ZYLOPRIM) 300 MG tablet Take 150 mg by mouth daily.     Marland Kitchen glycopyrrolate (ROBINUL) 2 MG tablet Take 1 tablet (2 mg total) by mouth 2 (two) times daily. 60 tablet 3  . hydrocortisone (ANUSOL-HC) 25 MG suppository Place 1 suppository (25 mg total) rectally at bedtime. For 10 days 10 suppository 0  . nitroGLYCERIN (NITROSTAT) 0.4 MG SL tablet Place 0.4 mg under the tongue every 5 (five) minutes as needed for chest pain.     No current facility-administered medications for this encounter.    Allergies  Allergen Reactions  .  Amoxicillin     REACTION: unspecified  . Penicillins     REACTION: unspecified    History   Social History  . Marital Status: Married    Spouse Name: N/A  . Number of Children: 3  . Years of Education: N/A   Occupational History  . slef employed semi retired    Social History Main Topics  . Smoking status: Never Smoker   . Smokeless tobacco: Never Used  . Alcohol Use: 1.2 oz/week    0 Standard drinks or equivalent, 2 Glasses of wine per  week     Comment: socially  . Drug Use: No  . Sexual Activity: Not on file   Other Topics Concern  . Not on file   Social History Narrative    Family History  Problem Relation Age of Onset  . Colon cancer Neg Hx   . Heart disease Father     questionable  . Stroke Mother   . Heart failure Sister   . Healthy Brother   . Healthy Sister   . Parkinsonism Brother     ROS- All systems are reviewed and negative except as per the HPI above  Physical Exam: Filed Vitals:   06/10/14 1516  BP: 118/70  Pulse: 60  Height: 5\' 7"  (1.702 m)  Weight: 153 lb 3.2 oz (69.491 kg)    GEN- The patient is well appearing, alert and oriented x 3 today.   Head- normocephalic, atraumatic Eyes-  Sclera clear, conjunctiva pink Ears- hearing intact Oropharynx- clear Neck- supple, no JVP Lymph- no cervical lymphadenopathy Lungs- Clear to ausculation bilaterally, normal work of breathing Heart- Regular rate and rhythm, no murmurs, rubs or gallops, PMI not laterally displaced GI- soft, NT, ND, + BS Extremities- no clubbing, cyanosis, or edema MS- no significant deformity or atrophy Skin- no rash or lesion Psych- euthymic mood, full affect Neuro- strength and sensation are intact  EKG- SR with PAC's, LAD, NSIV block, T wave abnormality, consider lateral ischemia. Pr int 162 ms, QRS 130 ms, QTc 476 ms. Echo-3/16-- Left ventricle: The cavity size was mildly dilated. Systolic function was severely reduced. The estimated ejection fraction was in  the range of 20% to 25%. Dyskinesis and scarring of the apical myocardium. Akinesis and scarring of the mid-apicalanteroseptal, anterior, and anterolateral myocardium; consistent with infarction in the distribution of the left anterior descending coronary artery; unchanged from the study of 01/01/2013. There was a reduced contribution of atrial contraction to ventricular filling, due to increased ventricular diastolic pressure or atrial contractile dysfunction. Features are consistent with a pseudonormal left ventricular filling pattern, with concomitant abnormal relaxation and increased filling pressure (grade 2 diastolic dysfunction). Cannot exclude apical thrombus. - Aortic valve: There was trivial regurgitation. - Mitral valve: There was moderate regurgitation directed centrally. - Left atrium: The atrium was moderately to severely dilated. - Right ventricle: Systolic function was mildly reduced. - Atrial septum: The septum bowed from left to right, consistent with increased left atrial pressure. No defect or patent foramen ovale was identified. Labs-06/04/14 Magnesium 1.7 - 2.4 mg/dL 1.9            Potassium 3.5 - 5.1 mmol/L 4.1   Chloride 101 - 111 mmol/L 102   CO2 22 - 32 mmol/L 25   Glucose, Bld 70 - 99 mg/dL 81   BUN 6 - 20 mg/dL 11   Creatinine, Ser 0.61 - 1.24 mg/dL 0.91   Calcium 8.9 - 10.3 mg/dL 9.0   GFR calc non Af Amer >60 mL/min >60   GFR calc Af Amer >60 mL/min >60        Epic records reviewed    Assessment and Plan: 1. Persistent AF- Recent initiation of Tikosyn Maintaining SR Last labs within last week satisfactory Will be due  for additional screening of magnesium and bmet f/u with [name] 07/06/14. Continue warfarin for CHA2DS2VASc score of 5 INR pending 5/20.  2. CAD/cardiomyopathy s/p ICD Stable Medicines without change F/u device clinic as scheduled 6/2.    F/u Dr. [name] in July, [place] clinic as  needed.

## 2014-06-10 NOTE — Patient Instructions (Signed)
Call if needed - [facility] [phone]

## 2014-06-11 ENCOUNTER — Telehealth: Payer: Self-pay | Admitting: Internal Medicine

## 2014-06-11 ENCOUNTER — Other Ambulatory Visit: Payer: Self-pay | Admitting: Pharmacist

## 2014-06-11 ENCOUNTER — Telehealth: Payer: Self-pay | Admitting: Cardiovascular Disease

## 2014-06-11 ENCOUNTER — Telehealth: Payer: Self-pay

## 2014-06-11 MED ORDER — CARVEDILOL 6.25 MG PO TABS: 9.3750 mg | ORAL_TABLET | Freq: Two times a day (BID) | ORAL | Status: DC

## 2014-06-11 NOTE — Patient Outreach (Signed)
Let patient know that I spoke with [name] at [employer] who stated that patient's application was accepted and the medication has been sent to his 85 office. Patient reports that he received a call from his 81 office today stating that the medication was there.  Patient reported that he has been in and out of atrial fibrillation throughout the day. Reports that he has spoken with his cardiologist's office and has been instructed to increase his carvedilol dose to 9.325mg  by mouth twice daily. Reminded patient to call the on call physician if he continues to have problems over the weekend.   Patient reports that he has no further pharmacy questions. Confirmed that patient has my contact information. No further pharmacy issues with this patient at this time.  Harlow Asa, PharmD Clinical Pharmacist [facility] [phone]

## 2014-06-11 NOTE — Telephone Encounter (Signed)
New Message   Patient is calling because he is having irregular heart beat. He would like to speak to nurse. Please give patient a call.

## 2014-06-11 NOTE — Telephone Encounter (Signed)
Received call from answering service--patient states he is having irregular heartbeat.  Please call

## 2014-06-11 NOTE — Telephone Encounter (Signed)
Pt also talked to someone at [facility], appears this problem was resolved per encounter note.

## 2014-06-11 NOTE — Telephone Encounter (Signed)
Called to speak to patient - he stated he was getting another call on the line, asked to hang up & he would call back.

## 2014-06-11 NOTE — Telephone Encounter (Signed)
New message      Pt states he is in AFIB.  Please call

## 2014-06-11 NOTE — Telephone Encounter (Signed)
Talked with patient going in and out of afib today.  Discussed with Roderic Palau NP - will increase coreg to 9.325mg  bid as long as SBP>100 and HR >60.  Instructed patient to only take 6.25mg  if SBP/HR do not meet the above criteria.  Patient verbalized understanding. Will call back on Monday to report HR over weekend. Instructed patient to call on-call physician if any issues arise over weekend.

## 2014-06-11 NOTE — Telephone Encounter (Signed)
Calling the office stating he is in Afib.  States he feels his heart pounding, fluttering in chest. States he is slightly SOB. Has not taken BP.  States he thinks his heart rate is about 106.  Usual HR between 55-60.  States he spoke to someone in [place] office about 8:00 and they told him to call back about 10:30 or 11:00.  States he took all of his medication around 7:30.  Advised to give the medication about another hour and if not feeling any better to call Kerby Less office back.  He verbalizes understanding and is OK with waiting another hour and he will call back if not feeling any better.

## 2014-06-11 NOTE — Telephone Encounter (Signed)
I just received message from Answering Service from 6:48 A.M. Irregular heart beat have come back.

## 2014-06-11 NOTE — Telephone Encounter (Signed)
Spoke to patient to pick up his samples of tikosyn

## 2014-06-11 NOTE — Patient Outreach (Signed)
Called [employer] at [phone] to determine status of the patient's application to receive Tikosyn through the company. Spoke with Reino Bellis who stated that patient's application was accepted and the medication has been sent to his physician's office. Will call to let Mr. Barstow know.   Harlow Asa, PharmD Clinical Pharmacist [place] [facility] [phone]

## 2014-06-15 ENCOUNTER — Other Ambulatory Visit: Payer: Self-pay | Admitting: *Deleted

## 2014-06-15 ENCOUNTER — Encounter: Payer: Self-pay | Admitting: *Deleted

## 2014-06-15 DIAGNOSIS — J3089 Other allergic rhinitis: Secondary | ICD-10-CM | POA: Diagnosis not present

## 2014-06-15 NOTE — Patient Outreach (Addendum)
[facility]) Care Management   06/15/2014  NILES ESS Nov 26, 1940 [id number]  [name] is an 74 y.o. male  Subjective:   THN: Reintroduced the [facility] program as pt receptive to enrolling and wishes to address his atrial fib and ongoing HTN issues. HTN: Pt states his BP is usually controlled however currently with issues related to his ongoing atrial fibillation (Dual Pacer reported). Pt states he awakes during the night and has informed his doctor's office who confirm his pacer is firing when needed.  Pt reports he has monthly pacer checks telephonic to check his pacer. Pt has indicated a lack of knowledge related to his HTN and improving his manage of care.  MEDICATIONS: Pt request education review on all his medications and the purpose of this medications (review and completed).  Pt will review the provide emmi information and inquire accordingly on the next home visit in a few weeks.  NUTRITION: Pt requested information on healthy eating habits as set as one of his short term goals. Food items discussed and pt reports salt smart dietary measures in reducing his HTN.   Objective:   Review of Systems  HENT: Negative.   Eyes: Negative.   Respiratory: Negative.   Cardiovascular: Positive for palpitations.       History of atrial fibrillation  Gastrointestinal: Negative.   Genitourinary: Negative.   Musculoskeletal: Negative.   Skin: Negative.   Neurological: Negative.   Endo/Heme/Allergies: Negative.   Psychiatric/Behavioral: Negative.     Physical Exam  Constitutional: He is oriented to person, place, and time. He appears well-developed and well-nourished.  HENT:  Right Ear: External ear normal.  Left Ear: External ear normal.  Nose: Nose normal.  Neck: Normal range of motion.  Cardiovascular: Normal heart sounds.   Bradycardia history  Respiratory: Effort normal and breath sounds normal.  GI: Soft. Bowel sounds are normal.  Musculoskeletal: Normal  range of motion.  Neurological: He is alert and oriented to person, place, and time.  Skin: Skin is warm and dry.  Psychiatric: He has a normal mood and affect. His behavior is normal. Judgment and thought content normal.    Current Medications:   Current Outpatient Prescriptions  Medication Sig Dispense Refill  . acetaminophen (TYLENOL) 650 MG CR tablet Take 650 mg by mouth 2 (two) times daily.     Marland Kitchen allopurinol (ZYLOPRIM) 300 MG tablet Take 150 mg by mouth daily.     Marland Kitchen aspirin 81 MG chewable tablet Chew 1 tablet (81 mg total) by mouth daily.    . carvedilol (COREG) 6.25 MG tablet Take 1.5 tablets (9.375 mg total) by mouth 2 (two) times daily with a meal. 60 tablet 11  . cetirizine (ZYRTEC) 10 MG tablet Take 10 mg by mouth daily.      . Cholecalciferol (VITAMIN D) 2000 UNITS CAPS Take 1 capsule by mouth daily.    Marland Kitchen dofetilide (TIKOSYN) 500 MCG capsule Take 1 capsule (500 mcg total) by mouth every 12 (twelve) hours. 14 capsule 0  . esomeprazole (NEXIUM) 40 MG capsule Take 1 capsule (40 mg total) by mouth daily. 30 capsule 11  . glycopyrrolate (ROBINUL) 2 MG tablet Take 1 tablet (2 mg total) by mouth 2 (two) times daily. 60 tablet 3  . hydrocortisone (ANUSOL-HC) 25 MG suppository Place 1 suppository (25 mg total) rectally at bedtime. For 10 days 10 suppository 0  . Hyoscyamine Sulfate 0.375 MG TBCR Take 1 tablet (0.375 mg total) by mouth 2 (two) times daily as needed.  60 tablet 1  . isosorbide mononitrate (IMDUR) 30 MG 24 hr tablet TAKE ONE-HALF TO ONE TABLET BY MOUTH ONCE DAILY 90 tablet 2  . lisinopril (PRINIVIL,ZESTRIL) 20 MG tablet Take 0.5 tablets (10 mg total) by mouth 2 (two) times daily. 90 tablet 3  . magnesium oxide (MAG-OX) 400 MG tablet Take 400 mg by mouth daily.    . mometasone (ASMANEX) 220 MCG/INH inhaler Inhale 1 puff into the lungs daily.     . mometasone (NASONEX) 50 MCG/ACT nasal spray Place 2 sprays into the nose daily as needed.     . montelukast (SINGULAIR) 10 MG tablet  Take 1 tablet (10 mg total) by mouth at bedtime. 90 tablet 3  . nitroGLYCERIN (NITROSTAT) 0.4 MG SL tablet Place 0.4 mg under the tongue every 5 (five) minutes as needed for chest pain.    . silodosin (RAPAFLO) 4 MG CAPS capsule Take 8 mg by mouth. Every 2 days    . simvastatin (ZOCOR) 40 MG tablet TAKE ONE TABLET BY MOUTH AT BEDTIME 90 tablet 3  . spironolactone (ALDACTONE) 25 MG tablet Take 0.5 tablets (12.5 mg total) by mouth daily. 30 tablet 11  . warfarin (COUMADIN) 5 MG tablet Take 1 tablet by mouth daily or as directed by coumadin clinic (Patient taking differently: Take 2.5-5 mg by mouth daily. Take 5mg  on Mon /Wed / Fri / Sat Take 2.5mg  on Sun / Tues / Thur) 90 tablet 1   No current facility-administered medications for this visit.    Functional Status:   In your present state of health, do you have any difficulty performing the following activities: 06/15/2014 06/01/2014  Hearing? N -  Vision? N -  Difficulty concentrating or making decisions? N -  Walking or climbing stairs? N -  Dressing or bathing? N -  Doing errands, shopping? N N  Preparing Food and eating ? N -  Using the Toilet? N -  In the past six months, have you accidently leaked urine? N -  Do you have problems with loss of bowel control? N -  Managing your Medications? N -  Managing your Finances? N -  Housekeeping or managing your Housekeeping? N -    Fall/Depression Screening:    PHQ 2/9 Scores 06/15/2014  PHQ - 2 Score 0    Assessment: Introduction to the [facility] program and services for enrollment. Case management related to HTN Nutitional issues related to heatlthy eating habits.  Plan:  Discussed [facility] program and services and enrolled pt into the HTN program and verified a signed consent form has been completed. Physical assessment completed with noted issues and interventions. Discussed in detail HTN and related topics concerning medications and recent discharged from the hospital. All medications  verified and pt continues to have enough medications prior to his next follow up appointment. Provided pt emmi educational material  and review HTN topics and encouraged pt to review prior to the next home visit for increased knowledge based on his ongoing medical condition. Also encouraged pt to obtain his BP daily with his home device and document all readings and report any acute symptoms to his provider or seeking medical attention as immediately.  Discussed nutritional habits and healthy food items that contain salt smart dietary measures. Will encouraged adherence with daily dietary eating habits.  Plan to follow up in few weeks with ongoing case management services and continue to encouraged self management of care in managing his HTN. Will re-evaluate pt's goals and plan of care on the  next visit and address accordingly.  EMMI printable material provided to pt today. About High BP (HTN) High Blood Pressure:  What you can do High Blood Pressure: Health Problems.  [name], RN Care Management Coordinator [place] [phone]       Addendum: Pt inquired on atrial fibrillation nd  if RN could be contacted if acute episodes are encountered. RN encouraged pt to seek medical assistance if his symptoms were acute or urgent however if mild pt needs to contact his provider for interventions. RN stress not to contact [facility] RN if it is an emergency or acute signs/symptoms related to his atrial fibrillation. Pt provided the contact number for the nurse's hotline via [facility] if needed however if urgent seek medical attention via ED or call 911. Pt with clear understanding that [facility] is not an urgent care or emergency providing services concerning acute symptoms related to his medical condition.

## 2014-06-17 ENCOUNTER — Telehealth: Payer: Self-pay | Admitting: *Deleted

## 2014-06-17 DIAGNOSIS — I10 Essential (primary) hypertension: Secondary | ICD-10-CM | POA: Diagnosis not present

## 2014-06-17 DIAGNOSIS — I480 Paroxysmal atrial fibrillation: Secondary | ICD-10-CM | POA: Diagnosis not present

## 2014-06-17 NOTE — Telephone Encounter (Signed)
Patient notified of magnesium results.

## 2014-06-17 NOTE — Telephone Encounter (Signed)
-----   Message from Troy Sine, MD sent at 06/15/2014 11:57 AM EDT ----- Mg ok

## 2014-06-18 ENCOUNTER — Ambulatory Visit (INDEPENDENT_AMBULATORY_CARE_PROVIDER_SITE_OTHER): Payer: Medicare Other | Admitting: Pharmacist Clinician (PhC)/ Clinical Pharmacy Specialist

## 2014-06-18 DIAGNOSIS — Z7901 Long term (current) use of anticoagulants: Secondary | ICD-10-CM | POA: Diagnosis not present

## 2014-06-18 DIAGNOSIS — I480 Paroxysmal atrial fibrillation: Secondary | ICD-10-CM

## 2014-06-18 DIAGNOSIS — I4891 Unspecified atrial fibrillation: Secondary | ICD-10-CM | POA: Diagnosis not present

## 2014-06-18 LAB — POCT INR: INR: 2.6

## 2014-06-21 ENCOUNTER — Ambulatory Visit (INDEPENDENT_AMBULATORY_CARE_PROVIDER_SITE_OTHER): Payer: Medicare Other | Admitting: Gastroenterology

## 2014-06-21 ENCOUNTER — Other Ambulatory Visit: Payer: Self-pay | Admitting: *Deleted

## 2014-06-21 ENCOUNTER — Encounter: Payer: Self-pay | Admitting: Gastroenterology

## 2014-06-21 ENCOUNTER — Encounter: Payer: Self-pay | Admitting: Cardiology

## 2014-06-21 VITALS — BP 100/60 | HR 60 | Ht 67.0 in | Wt 154.6 lb

## 2014-06-21 DIAGNOSIS — I255 Ischemic cardiomyopathy: Secondary | ICD-10-CM | POA: Diagnosis not present

## 2014-06-21 DIAGNOSIS — K30 Functional dyspepsia: Secondary | ICD-10-CM | POA: Diagnosis not present

## 2014-06-21 DIAGNOSIS — K648 Other hemorrhoids: Secondary | ICD-10-CM

## 2014-06-21 NOTE — Assessment & Plan Note (Signed)
Symptoms are very mild and well-controlled with anticholinergics.  Plan to continue with the same.

## 2014-06-21 NOTE — Patient Instructions (Signed)
Follow up as needed

## 2014-06-21 NOTE — Progress Notes (Signed)
      History of Present Illness: Since his last visit  Mr. Brickle has had no further rectal bleeding.  He has very vague, mild upper abdominal discomfort for which she takes either hyoscyamine or dicyclomine.  Symptoms have been stable.  Overall he is feeling well.    Review of Systems: Pertinent positive and negative review of systems were noted in the above HPI section. All other review of systems were otherwise negative.    Current Medications, Allergies, Past Medical History, Past Surgical History, Family History and Social History were reviewed in [place] record  Vital signs were reviewed in today's medical record. Physical Exam: General: Well developed , well nourished, no acute distress   See Assessment and Plan under Problem List

## 2014-06-21 NOTE — Assessment & Plan Note (Signed)
I believe this is responsible for his limited rectal bleeding which has subsided.  Recent Cologuard test was negative.  Plan no further workup or therapy.

## 2014-06-22 ENCOUNTER — Telehealth: Payer: Self-pay | Admitting: *Deleted

## 2014-06-22 ENCOUNTER — Ambulatory Visit (INDEPENDENT_AMBULATORY_CARE_PROVIDER_SITE_OTHER): Payer: Medicare Other | Admitting: Emergency Medicine

## 2014-06-22 ENCOUNTER — Ambulatory Visit (INDEPENDENT_AMBULATORY_CARE_PROVIDER_SITE_OTHER): Payer: Medicare Other

## 2014-06-22 VITALS — BP 120/72 | HR 61 | Temp 97.7°F | Resp 18 | Ht 67.0 in | Wt 153.6 lb

## 2014-06-22 DIAGNOSIS — J302 Other seasonal allergic rhinitis: Secondary | ICD-10-CM | POA: Diagnosis not present

## 2014-06-22 DIAGNOSIS — I255 Ischemic cardiomyopathy: Secondary | ICD-10-CM | POA: Diagnosis not present

## 2014-06-22 DIAGNOSIS — R059 Cough, unspecified: Secondary | ICD-10-CM

## 2014-06-22 DIAGNOSIS — R05 Cough: Secondary | ICD-10-CM | POA: Diagnosis not present

## 2014-06-22 NOTE — Progress Notes (Signed)
Subjective:  Patient ID: [name], male    DOB: Nov 06, 1940  Age: 74 y.o. MRN: [id number]  CC: Cough and Sore Throat   HPI [name] presents for evaluation of his cough. Extensive medical history was in the hospital earlier this month for 5 days for initiation of antiarrhythmics drug.  He has nasal congestion and watery nasal drainage. Denies any postnasal drainage. Has a scratchy sore throat. Has no fever or chills. He has a nonproductive cough. No wheezing or breath.  He trimmed his symptoms to allergies. Says that when he gets like this give his allergist gives him a shot of cortisone. He denies any symptoms referable to cardiac arrhythmia has no chest pain tightness heaviness pressure shortness breath. He has no improvement with over-the-counter medication.  History [name] has a past medical history of Coronary artery disease; Automatic implantable cardiac defibrillator in situ (2002; 2010); Atrial fib/flutter, transient; GERD (gastroesophageal reflux disease); H. pylori infection; Cardiomyopathy, ischemic (2011); H/O myocardial infarction, greater than 8 weeks (1992); and NSVT (nonsustained ventricular tachycardia).   He has past surgical history that includes Nasal sinus surgery (05/31/2010); Coronary artery bypass graft (1998); Cataract extraction, bilateral; Knee surgery; Hernia repair; and Icd generator change (01/2008).   His family history includes Healthy in his brother and sister; Heart disease in his father; Heart failure in his sister; Parkinsonism in his brother; Stroke in his mother. There is no history of Colon cancer.He reports that he has never smoked. He has never used smokeless tobacco. He reports that he drinks about 1.2 oz of alcohol per week. He reports that he does not use illicit drugs.  Outpatient Prescriptions Prior to Visit  Medication Sig Dispense Refill  . acetaminophen (TYLENOL) 650 MG CR tablet Take 650 mg by mouth 2 (two) times daily.     Marland Kitchen  allopurinol (ZYLOPRIM) 300 MG tablet Take 150 mg by mouth daily.     Marland Kitchen aspirin 81 MG chewable tablet Chew 1 tablet (81 mg total) by mouth daily.    . carvedilol (COREG) 6.25 MG tablet Take 1.5 tablets (9.375 mg total) by mouth 2 (two) times daily with a meal. 60 tablet 11  . cetirizine (ZYRTEC) 10 MG tablet Take 10 mg by mouth daily.      . Cholecalciferol (VITAMIN D) 2000 UNITS CAPS Take 1 capsule by mouth daily.    Marland Kitchen dofetilide (TIKOSYN) 500 MCG capsule Take 1 capsule (500 mcg total) by mouth every 12 (twelve) hours. 14 capsule 0  . esomeprazole (NEXIUM) 40 MG capsule Take 1 capsule (40 mg total) by mouth daily. 30 capsule 11  . glycopyrrolate (ROBINUL) 2 MG tablet Take 1 tablet (2 mg total) by mouth 2 (two) times daily. 60 tablet 3  . Hyoscyamine Sulfate 0.375 MG TBCR Take 1 tablet (0.375 mg total) by mouth 2 (two) times daily as needed. 60 tablet 1  . isosorbide mononitrate (IMDUR) 30 MG 24 hr tablet TAKE ONE-HALF TO ONE TABLET BY MOUTH ONCE DAILY 90 tablet 2  . lisinopril (PRINIVIL,ZESTRIL) 20 MG tablet Take 0.5 tablets (10 mg total) by mouth 2 (two) times daily. 90 tablet 3  . magnesium oxide (MAG-OX) 400 MG tablet Take 400 mg by mouth daily.    . mometasone (ASMANEX) 220 MCG/INH inhaler Inhale 1 puff into the lungs daily.     . mometasone (NASONEX) 50 MCG/ACT nasal spray Place 2 sprays into the nose daily as needed.     . montelukast (SINGULAIR) 10 MG tablet Take 1 tablet (10  mg total) by mouth at bedtime. 90 tablet 3  . nitroGLYCERIN (NITROSTAT) 0.4 MG SL tablet Place 0.4 mg under the tongue every 5 (five) minutes as needed for chest pain.    . silodosin (RAPAFLO) 4 MG CAPS capsule Take 8 mg by mouth. Every 2 days    . simvastatin (ZOCOR) 40 MG tablet TAKE ONE TABLET BY MOUTH AT BEDTIME 90 tablet 3  . spironolactone (ALDACTONE) 25 MG tablet Take 0.5 tablets (12.5 mg total) by mouth daily. 30 tablet 11  . warfarin (COUMADIN) 5 MG tablet Take 1 tablet by mouth daily or as directed by  coumadin clinic (Patient taking differently: Take 2.5-5 mg by mouth daily. Take 5mg  on Mon /Wed / Fri / Sat Take 2.5mg  on Sun / Tues / Thur) 90 tablet 1   No facility-administered medications prior to visit.    ROS Review of Systems  Constitutional: Positive for fatigue. Negative for fever, chills and appetite change.  HENT: Positive for congestion, rhinorrhea and sore throat. Negative for ear pain, postnasal drip and sinus pressure.   Eyes: Negative for pain and redness.  Respiratory: Positive for cough. Negative for shortness of breath and wheezing.   Cardiovascular: Negative for leg swelling.  Gastrointestinal: Negative for nausea, vomiting, abdominal pain, diarrhea, constipation and blood in stool.  Endocrine: Negative for polyuria.  Genitourinary: Negative for dysuria, urgency, frequency and flank pain.  Musculoskeletal: Negative for gait problem.  Skin: Negative for rash.  Neurological: Negative for weakness and headaches.  Psychiatric/Behavioral: Negative for confusion and decreased concentration. The patient is not nervous/anxious.     Objective:  BP 120/72 mmHg  Pulse 61  Temp(Src) 97.7 F (36.5 C) (Oral)  Resp 18  Ht 5\' 7"  (1.702 m)  Wt 153 lb 9.6 oz (69.673 kg)  BMI 24.05 kg/m2  SpO2 97%  Physical Exam  Constitutional: He is oriented to person, place, and time. He appears well-developed and well-nourished. No distress.  HENT:  Head: Normocephalic and atraumatic.  Right Ear: External ear normal.  Left Ear: External ear normal.  Nose: Nose normal.  Eyes: Conjunctivae and EOM are normal. Pupils are equal, round, and reactive to light. No scleral icterus.  Neck: Normal range of motion. Neck supple. No tracheal deviation present.  Cardiovascular: Normal rate, regular rhythm and normal heart sounds.   Pulmonary/Chest: Effort normal. No respiratory distress. He has no wheezes. He has rales (Right base).  Abdominal: He exhibits no mass. There is no tenderness. There is  no rebound and no guarding.  Musculoskeletal: He exhibits no edema.  Lymphadenopathy:    He has no cervical adenopathy.  Neurological: He is alert and oriented to person, place, and time. Coordination normal.  Skin: Skin is warm and dry. No rash noted.  Psychiatric: He has a normal mood and affect. His behavior is normal.      Assessment & Plan:   Sevon was seen today for cough and sore throat.  Diagnoses and all orders for this visit:  Cough Orders: -     DG Chest 2 View; Future   I am having Mr. Hasler maintain his allopurinol, cetirizine, mometasone, silodosin, acetaminophen, nitroGLYCERIN, mometasone, montelukast, warfarin, lisinopril, magnesium oxide, Vitamin D, esomeprazole, isosorbide mononitrate, Hyoscyamine Sulfate, glycopyrrolate, aspirin, spironolactone, dofetilide, simvastatin, and carvedilol.  No orders of the defined types were placed in this encounter.     [facility] reading (PRIMARY) by  Dr. Ouida Sills.  Negative chest other than post surgical changes.  He was instructed to resume using his nasal steroid spray.  Follow-up: No Follow-up on file.  Roselee Culver, MD

## 2014-06-22 NOTE — Patient Instructions (Signed)

## 2014-06-22 NOTE — Patient Outreach (Signed)
[facility]) Care Management  06/22/2014  [name] [birth year] [id number]   SUBJECTIVE:  Third transition of care outreach as pt is enrolled into the [facility] program for HTN management. Pt reports his recent BP readings (110/50 and 106/55 HR 57 (bradycardia history) and states he has been a little light-headed at times but no falls or injuries related. Reports at least 4 cups of daily fluid intake and a dry cough but no major issues have occurred.   ASSESSMENT: Hypotension related to low readings. Safety related to light headedness Potential dehydration related to limited fluid intake.  PLAN: Encouraged pt to contact his provider with any symptoms that do not improve once dehydrated. Encouraged pt to contact his providers if his BP continues to decrease with participating symptoms. If pt needs to contact his providers RN encouraged pt to inquire on possible parameters for follow up in the future if no improvement on his BP and/or symptoms.  Will encouraged pt to increase his hydration with fluid intake and avoid strenuous exercises or activities.  Will encouraged lozengers for his ongoing dry cough however if no improvement pt aware to contact his provider for possible prescriptions. RN will continue transition of care follow ups and contact in one week to inquire further on his ongoing management of care.  [name], RN Care Management Coordinator [facility] [phone]

## 2014-06-24 DIAGNOSIS — J3089 Other allergic rhinitis: Secondary | ICD-10-CM | POA: Diagnosis not present

## 2014-06-28 ENCOUNTER — Telehealth: Payer: Self-pay | Admitting: Internal Medicine

## 2014-06-28 NOTE — Telephone Encounter (Signed)
Called by patient with sore throat, throat clearing and globus sensation Been present a few weeks.  He did not mention to Dr. Deatra Ina when recently seen He has been off his Nexium for some time.  No true heartburn Using Ricola cough drops, but not really coughing, this soothes his throat  Globus, oropharyngeal irritation -- probably GERD related off PPI Resume Nexium 40 mg daily, one hour before breakfast Okay for cough drops and or Chloraseptic spray if needed to numb pharyngeal irritation GERD diet Call office if not better in one week

## 2014-06-29 ENCOUNTER — Other Ambulatory Visit: Payer: Self-pay | Admitting: *Deleted

## 2014-06-29 DIAGNOSIS — J3089 Other allergic rhinitis: Secondary | ICD-10-CM | POA: Diagnosis not present

## 2014-06-29 DIAGNOSIS — J301 Allergic rhinitis due to pollen: Secondary | ICD-10-CM | POA: Diagnosis not present

## 2014-06-29 NOTE — Patient Outreach (Signed)
[name]) Care Management  06/29/2014  [name] 1940/03/28 [id number]   Fourth Transition of care contact: RN spoke with pt who indicates he continues to do well with no reported incidents or acute symptoms. Pt reports several blood pressures readings from his home devices ranging from 96-120/52-61. Denies any symptoms of distress with no episodes encountered. RN inquired on medications as pt reports taking all medications as prescribed with no delays on his Tikosyn. Pt has no request or issues to discuss at this time as RN verified upcoming home visit on 6/14 for ongoing case management services via [facility].   [name], RN Care Management Coordinator [facility] [phone]

## 2014-07-01 ENCOUNTER — Ambulatory Visit (INDEPENDENT_AMBULATORY_CARE_PROVIDER_SITE_OTHER): Payer: Medicare Other | Admitting: *Deleted

## 2014-07-01 DIAGNOSIS — Z95810 Presence of automatic (implantable) cardiac defibrillator: Secondary | ICD-10-CM

## 2014-07-01 DIAGNOSIS — I5022 Chronic systolic (congestive) heart failure: Secondary | ICD-10-CM | POA: Diagnosis not present

## 2014-07-01 DIAGNOSIS — I255 Ischemic cardiomyopathy: Secondary | ICD-10-CM

## 2014-07-05 ENCOUNTER — Encounter: Payer: Self-pay | Admitting: *Deleted

## 2014-07-05 ENCOUNTER — Telehealth: Payer: Self-pay | Admitting: Internal Medicine

## 2014-07-05 DIAGNOSIS — H9209 Otalgia, unspecified ear: Secondary | ICD-10-CM | POA: Diagnosis not present

## 2014-07-05 DIAGNOSIS — J3089 Other allergic rhinitis: Secondary | ICD-10-CM | POA: Diagnosis not present

## 2014-07-05 NOTE — Telephone Encounter (Signed)
I called the patient for ICM follow up. He was hospitalized last month for a-fib/ flutter and tikosyn was initiated. He states that he thought his upper rates were changed to 200 bpm. He wanted to know what the high and low was for his device. I advised him I think his upper rate is 200 bpm and lower rate is 40 bpm, but that I would forward to the device clinic to confirm and ask that they call him back to verify. He is agreeable.

## 2014-07-05 NOTE — Progress Notes (Signed)
EPIC Encounter for ICM Monitoring  Patient Name: [name] is a 74 y.o. male Date: 07/05/2014 Primary Care Physican: [name], MD Primary Cardiologist: [name] Electrophysiologist: [name] Dry Weight: 154 lbs       In the past month, have you:  1. Gained more than 2 pounds in a day or more than 5 pounds in a week? no  2. Had changes in your medications (with verification of current medications)? Yes. He was admitted on 05/31/14 for a-fib/ flutter with RVR. He was started on TIkosyn 500 mcg BID. His coreg was also decreased to 6.25 mg 1.5 tablets BID.   3. Had more shortness of breath than is usual for you? no  4. Limited your activity because of shortness of breath? no  5. Not been able to sleep because of shortness of breath? no  6. Had increased swelling in your feet or ankles? no  7. Had symptoms of dehydration (dizziness, dry mouth, increased thirst, decreased urine output) no  8. Had changes in sodium restriction? no  9. Been compliant with medication? Yes   ICM trend:   Follow-up plan: ICM clinic phone appointment: 09/27/14. The patient is doing well overall today. He has only noticed 2 episodes that he thinks he may have been in a-fib since discharge. Once was for 5 hours and the other for about 2 hours. He states his DBP has been, at its lowest, in the 40's. His HR's have been down to about 48 bpm at times. He has been recording his readings. He is due to follow up tomorrow with [name]. He is due to follow up on 08/26/14 with Dr. [name].   Copy of note sent to patient's primary care physician, primary cardiologist, and device following physician.  Alvis Lemmings, RN, BSN 07/05/2014 11:05 AM

## 2014-07-06 ENCOUNTER — Encounter: Payer: Self-pay | Admitting: Cardiovascular Disease

## 2014-07-06 ENCOUNTER — Ambulatory Visit (INDEPENDENT_AMBULATORY_CARE_PROVIDER_SITE_OTHER): Payer: Medicare Other | Admitting: Cardiovascular Disease

## 2014-07-06 ENCOUNTER — Ambulatory Visit (INDEPENDENT_AMBULATORY_CARE_PROVIDER_SITE_OTHER): Payer: Medicare Other

## 2014-07-06 VITALS — BP 108/62 | HR 57 | Ht 67.0 in | Wt 152.2 lb

## 2014-07-06 DIAGNOSIS — R001 Bradycardia, unspecified: Secondary | ICD-10-CM | POA: Diagnosis not present

## 2014-07-06 DIAGNOSIS — I4891 Unspecified atrial fibrillation: Secondary | ICD-10-CM | POA: Diagnosis not present

## 2014-07-06 DIAGNOSIS — I480 Paroxysmal atrial fibrillation: Secondary | ICD-10-CM

## 2014-07-06 DIAGNOSIS — Z7901 Long term (current) use of anticoagulants: Secondary | ICD-10-CM

## 2014-07-06 DIAGNOSIS — E785 Hyperlipidemia, unspecified: Secondary | ICD-10-CM

## 2014-07-06 DIAGNOSIS — I255 Ischemic cardiomyopathy: Secondary | ICD-10-CM

## 2014-07-06 LAB — POCT INR: INR: 2.8

## 2014-07-06 MED ORDER — CARVEDILOL 6.25 MG PO TABS: 6.2500 mg | ORAL_TABLET | Freq: Two times a day (BID) | ORAL | Status: DC

## 2014-07-06 NOTE — Patient Instructions (Signed)
Your physician has recommended you make the following change in your medication: decrease the carvedilol to 6.25 mg twice daily.   Your physician recommends that you schedule a follow-up appointment with Dr. Rayann Heman at the end of July and [name] in 3 months.

## 2014-07-07 NOTE — Telephone Encounter (Signed)
Spoke w/pt to let know upper rate is set at 200 and lower rate at 40. Pt aware.

## 2014-07-08 ENCOUNTER — Other Ambulatory Visit: Payer: Self-pay | Admitting: Cardiology

## 2014-07-08 ENCOUNTER — Encounter: Payer: Self-pay | Admitting: Cardiovascular Disease

## 2014-07-08 DIAGNOSIS — E785 Hyperlipidemia, unspecified: Secondary | ICD-10-CM | POA: Insufficient documentation

## 2014-07-08 NOTE — Progress Notes (Signed)
Patient ID: [name], male   DOB: [birth year], 74 y.o.   MRN: [id number]     HPI: [name] is a 74 y.o. 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 In September 1998 he underwent CABG revascularization surgery after an unsuccessful attempt at stenting of his proximal LAD by [name]. Ejection fraction was 25-30%. In 2002 he underwent initial ICD implantation for nonsustained ventricular tachycardia documented on event monitor for primary prevention. In January 2010 he underwent generator change with a  Medtronic Virtuoso single chamber cardioverter defibrillator. Additional problems include paroxysmal atrial fibrillation, occasional PVCs and an attempt is made in the past to overdrive suppress his PVCs with reducing his beta blocker therapy.  He felt he had PVCs on the reduced dose and therefore has been on the higher dose.   A nuclear perfusion study  in November 2013 showed a large area of scar in the LAD territory (extent 44%) involving the mid to apical anterior,  apical and infero-apical to mid infero-septal and apical lateral wall without associated ischemia.  An echo Doppler study  on 01/01/2013 revealed a mildly dilated LV. Ejection fraction was 25-30%. There was dyskinesis in scarring of the apical myocardium as well as akinesis of the mid anteroseptal, anterior and anterolateral walls consistent with his prior LAD infarction. He does have a history of paroxysmal atrial fibrillation.   He  has a history of mild asthma for which he takes Singulair 10 mg in addition to Asmanex inhaler.  He has a history of hyperlipidemia and is tolerating Zocor 40 mg.  There is a remote history of gout, which he has been on allopurinol at low dose 150 mg daily without recurrence.  He is on Coumadin  anticoagulation and denies bleeding is unaware of anginal symptoms.   When I  saw him in January 2016, he had a optivol readings which were elevated from January 22 through February 22.  He had some intermittent atrial fibrillation during this time and also noted some intermittent shortness of breath, which he felt may be due to allergies.    When I saw him in March, I recommended further titration of his spiral lactone to 12.5 mg twice a day and he was on a carvedilol dose that had been titrated up to 18.75 mg twice a day.  A follow-up echo Doppler study of 04/14/2014 showed an EF of 20-25% with akinesis and scarring of his LAD territory consistent with his old MI.  There was grade 2 diastolic dysfunction.  There is trivial AR and moderate Jason; moderate to severe dilation of his both atrium and mild reduction of RV pressure.   He was readmitted to the hospital from a May 2 through 06/04/2014 with recurrent atrial fibrillation.  During that hospitalization, he was started on tickets and 500 mg twice a day by Dr. [name].  His dose of carvedilol was reduced to 9.375 g twice a day.  Since hospital discharge, he is unaware of recurrent arrhythmia.  He states at times his pulse is still low in the upper 40s to low 50s.  He notes occasional left jaw discomfort.  He denies chest pressure.  He denies PND, orthopnea.  He presents for evaluation.  Past Medical History  Diagnosis Date  . Coronary artery disease     Hx  MI 1992, CABG 1998 , Nuc study 11.2013 large scar but no ischemia  . 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 Atrial fib/flutter, transient   . GERD (gastroesophageal reflux disease)   . H. pylori infection     Hx of   . Cardiomyopathy, ischemic 2011    with EF 25-35% by echo  . H/O myocardial infarction, greater than 8 weeks 1992    large ant wall injury  . NSVT (nonsustained ventricular tachycardia)     Past Surgical History  Procedure  Laterality Date  . Nasal sinus surgery  05/31/2010    Dr. Benjamine Mola  . Coronary artery bypass graft  1998    x 5  . Cataract extraction, bilateral    . Knee surgery      right  . Hernia repair    . Icd generator change  01/2008    medtronic, hx+ EP study    Allergies  Allergen Reactions  . Amoxicillin     REACTION: unspecified  . Penicillins     REACTION: unspecified    Current Outpatient Prescriptions  Medication Sig Dispense Refill  . acetaminophen (TYLENOL) 650 MG CR tablet Take 650 mg by mouth 2 (two) times daily.     Marland Kitchen allopurinol (ZYLOPRIM) 300 MG tablet Take 150 mg by mouth daily.     Marland Kitchen aspirin 81 MG chewable tablet Chew 1 tablet (81 mg total) by mouth daily.    . carvedilol (COREG) 6.25 MG tablet Take 1 tablet (6.25 mg total) by mouth 2 (two) times daily with a meal. 60 tablet 11  . cetirizine (ZYRTEC) 10 MG tablet Take 10 mg by mouth daily.      . Cholecalciferol (VITAMIN D) 2000 UNITS CAPS Take 1 capsule by mouth daily.    Marland Kitchen dofetilide (TIKOSYN) 500 MCG capsule Take 1 capsule (500 mcg total) by mouth every 12 (twelve) hours. 14 capsule 0  . esomeprazole (NEXIUM) 40 MG capsule Take 1 capsule (40 mg total) by mouth daily. 30 capsule 11  . glycopyrrolate (ROBINUL) 2 MG tablet Take 1 tablet (2 mg total) by mouth 2 (two) times daily. (Patient taking differently: Take 2 mg by mouth. Take as needed) 60 tablet 3  . Hyoscyamine Sulfate 0.375 MG TBCR Take 1 tablet (0.375 mg total) by mouth 2 (two) times daily as needed. 60 tablet 1  . isosorbide mononitrate (IMDUR) 30 MG 24 hr tablet TAKE ONE-HALF TO ONE TABLET BY MOUTH ONCE DAILY 90 tablet 2  . lisinopril (PRINIVIL,ZESTRIL) 20 MG tablet Take 0.5 tablets (10 mg total) by mouth 2 (two) times daily. 90 tablet 3  . magnesium oxide (MAG-OX) 400 MG tablet Take 400 mg by mouth daily.    . mometasone (ASMANEX) 220 MCG/INH inhaler Inhale 1 puff into the lungs daily.     . mometasone (NASONEX) 50 MCG/ACT nasal spray Place 2 sprays into the nose  daily as needed.     . montelukast (SINGULAIR) 10 MG tablet Take 1 tablet (10 mg total) by mouth at bedtime. 90 tablet 3  . nitroGLYCERIN (NITROSTAT) 0.4 MG SL tablet Place 0.4 mg under the tongue every 5 (five) minutes as needed for chest pain.    . silodosin (RAPAFLO) 4 MG CAPS capsule Take 8 mg by mouth. Every 2 days    . simvastatin (ZOCOR) 40 MG tablet TAKE ONE TABLET BY MOUTH AT BEDTIME 90 tablet 3  . spironolactone (ALDACTONE) 25 MG tablet Take 0.5 tablets (12.5 mg total)  by mouth daily. 30 tablet 11  . warfarin (COUMADIN) 5 MG tablet Take 1 tablet by mouth daily or as directed by coumadin clinic (Patient taking differently: Take 2.5-5 mg by mouth daily. Take 61m on Mon /Wed / Fri / Sat Take 2.59mon Sun / Tues / Thur) 90 tablet 1   No current facility-administered medications for this visit.    Socially he is married and has 3 children and 5 grandchildren. He does exercise most recently with walking. He used to play tennis. There is no tobacco or alcohol use.  ROS General: Negative; No fevers, chills, or night sweats;  HEENT: Negative; No changes in vision or hearing, sinus congestion, difficulty swallowing Pulmonary:  Positive for mild asthma; No cough, wheezing, shortness of breath, hemoptysis Cardiovascular: see history of present illness  GI: History of GERD No nausea, vomiting, diarrhea, or abdominal pain GU: Negative; No dysuria, hematuria, or difficulty voiding Musculoskeletal: Negative; no myalgias, joint pain, or weakness Hematologic/Oncology: Negative; no easy bruising, bleeding Endocrine: Negative; no heat/cold intolerance; no diabetes Neuro: Negative; no changes in balance, headaches Skin: Negative; No rashes or skin lesions Psychiatric: Negative; No behavioral problems, depression Sleep: Negative; No snoring, daytime sleepiness, hypersomnolence, bruxism, restless legs, hypnogognic hallucinations, no cataplexy Other comprehensive 14 point system review is  negative.   PE BP 108/62 mmHg  Pulse 57  Ht 5' 7"  (1.702 m)  Wt 152 lb 3.2 oz (69.037 kg)  BMI 23.83 kg/m2  Wt Readings from Last 3 Encounters:  07/06/14 152 lb 3.2 oz (69.037 kg)  06/22/14 153 lb 9.6 oz (69.673 kg)  06/21/14 154 lb 9.6 oz (70.126 kg)  . General: Alert, oriented, no distress.  Skin: normal turgor, no rashes HEENT: Normocephalic, atraumatic. Pupils round and reactive; sclera anicteric;no lid lag,  Nose without nasal septal hypertrophy Mouth/Parynx benign; Mallinpatti scale 2 Neck: No JVD, no carotid bruits; normal carotid upstroke Chest wall: No tenderness to palpation Lungs: clear to ausculatation and percussion; no wheezing or rales Heart: RRR, s1 s2 normal 2/6 systolic murmur.  No diastolic murmur No S3 gallop. No heave or parasternal lift. No rub. Abdomen: soft, nontender; no hepatosplenomehaly, BS+; abdominal aorta nontender and not dilated by palpation. Back: No CVA tenderness Pulses 2+ Extremities: no clubbing cyanosis or edema, Homan's sign negative  Neurologic: grossly nonfocal; cranial nerves normal Psychological: Normal affect and mood.  ECG (independently read by me): Sinus bradycardia 57.  Nonspecific interventricular block with a QRS duration of 1:30 milliseconds.  Old anterolateral MI.  PR interval 154 ms, QTc interval 517 ms.  ECG (independently read by me): Sinus rhythm with PACs.  Old anterior wall MI.  Left axis deviation.  Probable left atrial enlargement.  02/09/2014 ECG (independently read by me):  normal sinus rhythm with sinus arrhythmia at 63 bpm.  One isolated PVC.  10/06/2013 (independently read by me): Sinus rhythm at 56 beats per minute with occasional isolated PVC.  06/01/2013 ECG (independently read by me): Sinus bradycardia at 49 beats per minute.  No ectopy.  QTc interval 45 ms.  Prior ECG (independently read by me): Sinus bradycardia 52 beats per minute. Old anterior lateral myocardial infarction. Nonspecific interventricular  conduction delay. Previously noted T-wave changes.  ECG from 08/28/2012: Sinus bradycardia 52 beats per minute. No ectopy. Diffuse T wave abnormality the 4 through V6 1 and L. Poor R-wave progression compatible with prior anterior wall MI.  LABS:  BMET  BMP Latest Ref Rng 06/04/2014 06/03/2014 06/02/2014  Glucose 70 - 99 mg/dL 81 80 90  BUN 6 - 20 mg/dL 11 11 15   Creatinine 0.61 - 1.24 mg/dL 0.91 0.97 0.88  Sodium 135 - 145 mmol/L 137 136 134(L)  Potassium 3.5 - 5.1 mmol/L 4.1 4.3 4.1  Chloride 101 - 111 mmol/L 102 103 101  CO2 22 - 32 mmol/L 25 26 23   Calcium 8.9 - 10.3 mg/dL 9.0 9.2 8.9     Hepatic Function Panel     Component Value Date/Time   PROT 6.4 06/01/2013 0813   ALBUMIN 4.0 06/01/2013 0813   AST 17 06/01/2013 0813   ALT 14 06/01/2013 0813   ALKPHOS 45 06/01/2013 0813   BILITOT 0.6 06/01/2013 0813   BILIDIR 0.2 11/07/2012 1509   Hepatic Function Latest Ref Rng 06/01/2013 02/17/2013 11/07/2012  Total Protein 6.0 - 8.3 g/dL 6.4 6.9 7.4  Albumin 3.5 - 5.2 g/dL 4.0 4.1 4.1  AST 0 - 37 U/L 17 18 21   ALT 0 - 53 U/L 14 16 18   Alk Phosphatase 39 - 117 U/L 45 45 49  Total Bilirubin 0.2 - 1.2 mg/dL 0.6 0.7 0.9  Bilirubin, Direct 0.0 - 0.3 mg/dL - - 0.2     CBC  CBC Latest Ref Rng 06/01/2014 05/31/2014 06/01/2013  WBC 4.0 - 10.5 K/uL 10.4 12.0(H) 7.7  Hemoglobin 13.0 - 17.0 g/dL 14.5 15.5 14.3  Hematocrit 39.0 - 52.0 % 45.4 45.9 42.7  Platelets 150 - 400 K/uL 232 231 303     BNP No results found for: PROBNP    Lipid Panel     Component Value Date/Time   CHOL 133 06/01/2014 0540   TRIG 100 06/01/2014 0540   HDL 35* 06/01/2014 0540   CHOLHDL 3.8 06/01/2014 0540   VLDL 20 06/01/2014 0540   LDLCALC 78 06/01/2014 0540    RADIOLOGY: No results found.    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  I reviewed his most recent echo  Doppler study from March and this revealed an EF in the 2825% range.  His last nuclear perfusion study in November 2013 was nonischemic.   He has had issues with paroxysmal atrial fibrillation.  This did improve with resumption of amiodarone but he had been taken off this by Dr. [name].  With his recent hospitalization, he was started on tikosyn for recurrent AF.  He seems to tolerate this well.  He may have had 3 short recurrent episodes since hospital discharge with his last on May 20 by his records.  Although his QTC is increased today at 5.7 ms, his QRS duration is 1:30, and after discussion with Dr. [name].  It was felt to continue him on his current dose of fleck might since if one takes into his count.  His QRS duration, his affective QTC is 487 ms. .  He has had issues with bradycardia.  I will further reduce his carvedilol to 6.25 mg twice a day since he is on tic is in.  He is not having any anginal symptomatology.  He does not have edema.  I do not feel his jaw discomfort is ischemic in etiology.  He's not had any asthma flare and continues to take Singulair 10 mg daily in addition to his Asmanex.  He has not had recent issues with gout.  There continues to be on allopurinol.  He is on Coumadin anticoagulation with an INR of 2.8.  He continues to  be on simvastatin for hyperlipidemia.  I reviewed his blood pressure recordings at home, which he brought in with him today in his log.  He will be seeing Dr. [name] next month.  I will see him in 3 months for reevaluation.  Time spent: 25 minutes   Troy Sine, MD, [place]  07/08/2014 7:49 AM

## 2014-07-12 DIAGNOSIS — J3089 Other allergic rhinitis: Secondary | ICD-10-CM | POA: Diagnosis not present

## 2014-07-12 DIAGNOSIS — J301 Allergic rhinitis due to pollen: Secondary | ICD-10-CM | POA: Diagnosis not present

## 2014-07-13 ENCOUNTER — Other Ambulatory Visit: Payer: Self-pay | Admitting: *Deleted

## 2014-07-13 NOTE — Patient Outreach (Signed)
[facility]) Care Management   07/13/2014  [name] 28-Apr-1940 [id number]  [name] is an 74 y.o. male  Subjective:  HTN: Pt reports he continues to do well with no problems as he continues to monitoring his BP with his home device. Pt states he has taken his BP daily and documented all readings in his calendar tools.   Denies any signs or symptoms related to hypo-hypertension and to be aware of acute symptoms and what to do if acute. Pt states he will obtain his BP three times week for his ongoing monitoring since his recent medication change.  MEDICATION: Pt reports his newly prescribed medication dose change for his Coreg and states he is taking this medication as prescribed with no problems. Pt discussed the medication and purpose as counseling was often and verified.  No other inquires or request at this time as pt continues to do well with no acute symptoms or episodes.    Objective:   Review of Systems  Constitutional: Negative.   HENT: Negative.   Eyes: Negative.   Respiratory: Negative.   Cardiovascular: Negative.   Gastrointestinal: Negative.   Genitourinary: Negative.   Musculoskeletal: Negative.   Neurological: Negative.   Endo/Heme/Allergies: Negative.   Psychiatric/Behavioral: Negative.     Physical Exam  Constitutional: He is oriented to person, place, and time. He appears well-developed and well-nourished.  HENT:  Right Ear: External ear normal.  Left Ear: External ear normal.  Neck: Normal range of motion.  Cardiovascular: Normal rate and normal heart sounds.   Respiratory: Effort normal and breath sounds normal.  GI: Soft. Bowel sounds are normal.  Musculoskeletal: Normal range of motion.  Neurological: He is alert and oriented to person, place, and time.  Skin: Skin is warm and dry.  Psychiatric: He has a normal mood and affect. His behavior is normal. Judgment and thought content normal.    Current Medications:   Current  Outpatient Prescriptions  Medication Sig Dispense Refill  . acetaminophen (TYLENOL) 650 MG CR tablet Take 650 mg by mouth 2 (two) times daily.     Marland Kitchen allopurinol (ZYLOPRIM) 300 MG tablet Take 150 mg by mouth daily.     Marland Kitchen aspirin 81 MG chewable tablet Chew 1 tablet (81 mg total) by mouth daily.    . carvedilol (COREG) 6.25 MG tablet Take 1 tablet (6.25 mg total) by mouth 2 (two) times daily with a meal. 60 tablet 11  . cetirizine (ZYRTEC) 10 MG tablet Take 10 mg by mouth daily.      . Cholecalciferol (VITAMIN D) 2000 UNITS CAPS Take 1 capsule by mouth daily.    Marland Kitchen dofetilide (TIKOSYN) 500 MCG capsule Take 1 capsule (500 mcg total) by mouth every 12 (twelve) hours. 14 capsule 0  . esomeprazole (NEXIUM) 40 MG capsule Take 1 capsule (40 mg total) by mouth daily. 30 capsule 11  . glycopyrrolate (ROBINUL) 2 MG tablet Take 1 tablet (2 mg total) by mouth 2 (two) times daily. (Patient taking differently: Take 2 mg by mouth. Take as needed) 60 tablet 3  . Hyoscyamine Sulfate 0.375 MG TBCR Take 1 tablet (0.375 mg total) by mouth 2 (two) times daily as needed. 60 tablet 1  . isosorbide mononitrate (IMDUR) 30 MG 24 hr tablet TAKE ONE-HALF TO ONE TABLET BY MOUTH ONCE DAILY 90 tablet 2  . lisinopril (PRINIVIL,ZESTRIL) 20 MG tablet Take 0.5 tablets (10 mg total) by mouth 2 (two) times daily. 90 tablet 3  . magnesium oxide (MAG-OX)  400 MG tablet Take 400 mg by mouth daily.    . mometasone (ASMANEX) 220 MCG/INH inhaler Inhale 1 puff into the lungs daily.     . mometasone (NASONEX) 50 MCG/ACT nasal spray Place 2 sprays into the nose daily as needed.     . montelukast (SINGULAIR) 10 MG tablet TAKE ONE TABLET BY MOUTH AT BEDTIME 90 tablet 1  . nitroGLYCERIN (NITROSTAT) 0.4 MG SL tablet Place 0.4 mg under the tongue every 5 (five) minutes as needed for chest pain.    . silodosin (RAPAFLO) 4 MG CAPS capsule Take 8 mg by mouth. Every 2 days    . simvastatin (ZOCOR) 40 MG tablet TAKE ONE TABLET BY MOUTH AT BEDTIME 90  tablet 3  . spironolactone (ALDACTONE) 25 MG tablet Take 0.5 tablets (12.5 mg total) by mouth daily. 30 tablet 11  . warfarin (COUMADIN) 5 MG tablet Take 1 tablet by mouth daily or as directed by coumadin clinic (Patient taking differently: Take 2.5-5 mg by mouth daily. Take 5mg  on Mon /Wed / Fri / Sat Take 2.5mg  on Sun / Tues / Thur) 90 tablet 1   No current facility-administered medications for this visit.    Functional Status:   In your present state of health, do you have any difficulty performing the following activities: 07/13/2014 06/15/2014  Hearing? N N  Vision? N N  Difficulty concentrating or making decisions? N N  Walking or climbing stairs? N N  Dressing or bathing? N N  Doing errands, shopping? N N  Preparing Food and eating ? N N  Using the Toilet? N N  In the past six months, have you accidently leaked urine? N N  Do you have problems with loss of bowel control? N N  Managing your Medications? N N  Managing your Finances? N N  Housekeeping or managing your Housekeeping? N N    Fall/Depression Screening:    PHQ 2/9 Scores 07/13/2014 06/15/2014  PHQ - 2 Score 0 0    Assessment:   Ongoing case management related to HTN Ongoing BP monitoring related the fluctuating readings. Medication adherence related to medication change on prescribed medication    Plan: Physical assessment completed with no acute symptoms presented today. Will reiterated on HTN symptoms and what to do if acute. Will verified pt is aware of HTN and what to do if acute symptoms should occur. Will review the information presented for teach back method and continue to encouraged adherence with managing his HTN. Will continue to encourage to take his medication 3 times weekly for ongoing BP monitoring and continue to encouraged pt to document all readings accordingly in his calendar tool provided via [place] on the last home visit.  Will discussed the medication Coreg and the importance of taking the  newly prescribed dose change recommended by his provided on his last provider's visit. Will verify pt is taking the new dose and has enough supplies and taking the new dose without any problems.  Review pt's plan of care and review all goals with new goals in place. Will re-evaluate goals on next home visit for possible discharge if pt remains adherent with his plan of care. Pt receptive to this potential discharge phrase if he remains adherent.   [name], RN Care Management Coordinator [facility] Main Office [phone]

## 2014-07-15 DIAGNOSIS — M25561 Pain in right knee: Secondary | ICD-10-CM | POA: Diagnosis not present

## 2014-07-15 DIAGNOSIS — M109 Gout, unspecified: Secondary | ICD-10-CM | POA: Diagnosis not present

## 2014-07-20 DIAGNOSIS — J3089 Other allergic rhinitis: Secondary | ICD-10-CM | POA: Diagnosis not present

## 2014-07-26 ENCOUNTER — Other Ambulatory Visit: Payer: Self-pay

## 2014-07-27 ENCOUNTER — Ambulatory Visit (INDEPENDENT_AMBULATORY_CARE_PROVIDER_SITE_OTHER): Payer: Medicare Other | Admitting: Pharmacist Clinician (PhC)/ Clinical Pharmacy Specialist

## 2014-07-27 DIAGNOSIS — J3089 Other allergic rhinitis: Secondary | ICD-10-CM | POA: Diagnosis not present

## 2014-07-27 DIAGNOSIS — I4891 Unspecified atrial fibrillation: Secondary | ICD-10-CM | POA: Diagnosis not present

## 2014-07-27 DIAGNOSIS — I480 Paroxysmal atrial fibrillation: Secondary | ICD-10-CM | POA: Diagnosis not present

## 2014-07-27 DIAGNOSIS — Z7901 Long term (current) use of anticoagulants: Secondary | ICD-10-CM | POA: Diagnosis not present

## 2014-07-27 LAB — POCT INR: INR: 2.9

## 2014-07-27 MED ORDER — WARFARIN SODIUM 5 MG PO TABS: ORAL_TABLET | ORAL | Status: DC

## 2014-07-29 ENCOUNTER — Telehealth: Payer: Self-pay | Admitting: *Deleted

## 2014-07-29 MED ORDER — WARFARIN SODIUM 5 MG PO TABS: ORAL_TABLET | ORAL | Status: DC

## 2014-08-04 DIAGNOSIS — J3089 Other allergic rhinitis: Secondary | ICD-10-CM | POA: Diagnosis not present

## 2014-08-06 ENCOUNTER — Telehealth: Payer: Self-pay

## 2014-08-06 MED ORDER — DOFETILIDE 500 MCG PO CAPS: 500.0000 ug | ORAL_CAPSULE | Freq: Two times a day (BID) | ORAL | Status: DC

## 2014-08-06 NOTE — Telephone Encounter (Signed)
Patient calls today to inform us that we need to re-order his Tikosyn. Apparently, someone initiated Patient Assistance Program for him, unbeknownst to [name]. Paper was found from [employer] with a phone no# for re-ordering I informed him that we would take care of re-ordering Tikosyn [id number].

## 2014-08-09 DIAGNOSIS — J3089 Other allergic rhinitis: Secondary | ICD-10-CM | POA: Diagnosis not present

## 2014-08-16 DIAGNOSIS — J301 Allergic rhinitis due to pollen: Secondary | ICD-10-CM | POA: Diagnosis not present

## 2014-08-16 DIAGNOSIS — J3089 Other allergic rhinitis: Secondary | ICD-10-CM | POA: Diagnosis not present

## 2014-08-17 ENCOUNTER — Other Ambulatory Visit: Payer: Self-pay | Admitting: *Deleted

## 2014-08-17 ENCOUNTER — Encounter: Payer: Self-pay | Admitting: *Deleted

## 2014-08-17 NOTE — Patient Outreach (Signed)
[facility]) Care Management   08/17/2014  [name] 1941/01/13 [id number]  [name] is an 74 y.o. male  Subjective:   HTN: Pt reports he continues to do well with no problems and takes his BP daily and documents all readings for his doctors. Denies any signs or symptoms related to HTN however on a one day BP diastolic was 50 but no symptoms reported.  MEDICATION: Pt reports he is taking all medications with no related problems.  No other issues reported as pt continues to be aware of what to do if acute symptoms occur. Pt has indicated he has met his goals and confirmed adherence when inquired. Denies any system issues with no acute events since RN's last visit. Pt has requested to contacted this RN if any future problems occur concerning his medical condition. No other inquires or request at this time.   Objective:   Review of Systems  All other systems reviewed and are negative.   Physical Exam  Constitutional: He is oriented to person, place, and time. He appears well-developed and well-nourished.  Neck: Normal range of motion.  Cardiovascular: Normal rate and normal heart sounds.   Respiratory: Effort normal and breath sounds normal.  GI: Soft. Bowel sounds are normal.  Musculoskeletal: Normal range of motion.  Neurological: He is alert and oriented to person, place, and time.  Psychiatric: He has a normal mood and affect. His behavior is normal. Judgment and thought content normal.    Current Medications:   Current Outpatient Prescriptions  Medication Sig Dispense Refill  . acetaminophen (TYLENOL) 650 MG CR tablet Take 650 mg by mouth 2 (two) times daily.     Marland Kitchen allopurinol (ZYLOPRIM) 300 MG tablet Take 150 mg by mouth daily.     Marland Kitchen aspirin 81 MG chewable tablet Chew 1 tablet (81 mg total) by mouth daily.    . carvedilol (COREG) 6.25 MG tablet Take 1 tablet (6.25 mg total) by mouth 2 (two) times daily with a meal. 60 tablet 11  . cetirizine (ZYRTEC)  10 MG tablet Take 10 mg by mouth daily.      . Cholecalciferol (VITAMIN D) 2000 UNITS CAPS Take 1 capsule by mouth daily.    Marland Kitchen dofetilide (TIKOSYN) 500 MCG capsule Take 1 capsule (500 mcg total) by mouth every 12 (twelve) hours. 180 capsule 0  . esomeprazole (NEXIUM) 40 MG capsule Take 1 capsule (40 mg total) by mouth daily. 30 capsule 11  . glycopyrrolate (ROBINUL) 2 MG tablet Take 1 tablet (2 mg total) by mouth 2 (two) times daily. (Patient taking differently: Take 2 mg by mouth. Take as needed) 60 tablet 3  . Hyoscyamine Sulfate 0.375 MG TBCR Take 1 tablet (0.375 mg total) by mouth 2 (two) times daily as needed. 60 tablet 1  . isosorbide mononitrate (IMDUR) 30 MG 24 hr tablet TAKE ONE-HALF TO ONE TABLET BY MOUTH ONCE DAILY 90 tablet 2  . lisinopril (PRINIVIL,ZESTRIL) 20 MG tablet Take 0.5 tablets (10 mg total) by mouth 2 (two) times daily. 90 tablet 3  . magnesium oxide (MAG-OX) 400 MG tablet Take 400 mg by mouth daily.    . mometasone (ASMANEX) 220 MCG/INH inhaler Inhale 1 puff into the lungs daily.     . mometasone (NASONEX) 50 MCG/ACT nasal spray Place 2 sprays into the nose daily as needed.     . montelukast (SINGULAIR) 10 MG tablet TAKE ONE TABLET BY MOUTH AT BEDTIME 90 tablet 1  . nitroGLYCERIN (NITROSTAT) 0.4 MG  SL tablet Place 0.4 mg under the tongue every 5 (five) minutes as needed for chest pain.    . silodosin (RAPAFLO) 4 MG CAPS capsule Take 8 mg by mouth. Every 2 days    . simvastatin (ZOCOR) 40 MG tablet TAKE ONE TABLET BY MOUTH AT BEDTIME 90 tablet 3  . spironolactone (ALDACTONE) 25 MG tablet Take 0.5 tablets (12.5 mg total) by mouth daily. 30 tablet 11  . warfarin (COUMADIN) 5 MG tablet Take 1 tablet by mouth daily or as directed by coumadin clinic 90 tablet 0   No current facility-administered medications for this visit.    Functional Status:   In your present state of health, do you have any difficulty performing the following activities: 07/13/2014 06/15/2014  Hearing? N N   Vision? N N  Difficulty concentrating or making decisions? N N  Walking or climbing stairs? N N  Dressing or bathing? N N  Doing errands, shopping? N N  Preparing Food and eating ? N N  Using the Toilet? N N  In the past six months, have you accidently leaked urine? N N  Do you have problems with loss of bowel control? N N  Managing your Medications? N N  Managing your Finances? N N  Housekeeping or managing your Housekeeping? N N    Fall/Depression Screening:    PHQ 2/9 Scores 07/13/2014 06/15/2014  PHQ - 2 Score 0 0    Assessment:  Follow up on ongoing monitoring related to HTN  Follow up on adherence with prescribed medications.   Plan:  Discussed and verified pt continues to monitor his ongoing BP with his home device with comparable readings when compared to RN manual readings. Verified no acute symptoms or signs have been encountered as pt continues to do well in managing her HTN.  Verified pt continues to take her daily prescribed medications with no reported problems or delays. Will continue to encouraged adherence as recommended.  Due to the success of pt meeting his goals and following the plan of care RN has discussed discharging pt via [facility] services with this success. Pt receptive to the discharge today and grateful for the information and education provided. Pt continues to use the provided tools and RN will continue to encouraged daily monitoring and documentation of her BP readings for providers to view on his office visits. No additional request or inquires at this time as RN will once again discharge pt from the [facility] program and services. Pt aware to contact the [facility] office if needed in there future for further involvement of community resources as needed.  Will notify pt's primary of today's discharge from [facility].  [name], RN Care Management Coordinator [place] [facility] Main Office [phone]

## 2014-08-20 NOTE — Patient Outreach (Signed)
[facility]) Care Management  08/20/2014  [name] 1940/04/16 [id number]   Notification from [name], RN to close case due to goals met with [place] Management.  [name]. [place], [place] Management [place] Assistant Phone: [phone] Fax: [phone]

## 2014-08-23 DIAGNOSIS — J3089 Other allergic rhinitis: Secondary | ICD-10-CM | POA: Diagnosis not present

## 2014-08-25 ENCOUNTER — Ambulatory Visit (INDEPENDENT_AMBULATORY_CARE_PROVIDER_SITE_OTHER): Payer: Medicare Other | Admitting: Pharmacist Clinician (PhC)/ Clinical Pharmacy Specialist

## 2014-08-25 VITALS — BP 110/58 | HR 60

## 2014-08-25 DIAGNOSIS — Z7901 Long term (current) use of anticoagulants: Secondary | ICD-10-CM

## 2014-08-25 DIAGNOSIS — I480 Paroxysmal atrial fibrillation: Secondary | ICD-10-CM | POA: Diagnosis not present

## 2014-08-25 DIAGNOSIS — I4891 Unspecified atrial fibrillation: Secondary | ICD-10-CM | POA: Diagnosis not present

## 2014-08-25 LAB — POCT INR: INR: 2

## 2014-08-26 ENCOUNTER — Encounter: Payer: Self-pay | Admitting: Internal Medicine

## 2014-08-26 ENCOUNTER — Ambulatory Visit (INDEPENDENT_AMBULATORY_CARE_PROVIDER_SITE_OTHER): Payer: Medicare Other | Admitting: Internal Medicine

## 2014-08-26 VITALS — BP 108/70 | HR 59 | Ht 67.0 in | Wt 155.0 lb

## 2014-08-26 DIAGNOSIS — I4729 Other ventricular tachycardia: Secondary | ICD-10-CM

## 2014-08-26 DIAGNOSIS — I5022 Chronic systolic (congestive) heart failure: Secondary | ICD-10-CM | POA: Diagnosis not present

## 2014-08-26 DIAGNOSIS — I480 Paroxysmal atrial fibrillation: Secondary | ICD-10-CM | POA: Diagnosis not present

## 2014-08-26 DIAGNOSIS — I472 Ventricular tachycardia, unspecified: Secondary | ICD-10-CM

## 2014-08-26 DIAGNOSIS — Z79899 Other long term (current) drug therapy: Secondary | ICD-10-CM

## 2014-08-26 DIAGNOSIS — I255 Ischemic cardiomyopathy: Secondary | ICD-10-CM

## 2014-08-26 LAB — CUP PACEART INCLINIC DEVICE CHECK
Battery Voltage: 2.9 V
Brady Statistic RV Percent Paced: 0.36 %
Date Time Interrogation Session: 20160728133403
HIGH POWER IMPEDANCE MEASURED VALUE: 76 Ohm
HighPow Impedance: 50 Ohm
Lead Channel Pacing Threshold Amplitude: 1 V
Lead Channel Pacing Threshold Pulse Width: 0.7 ms
Lead Channel Sensing Intrinsic Amplitude: 16.3072
Lead Channel Setting Pacing Amplitude: 2.5 V
Lead Channel Setting Pacing Pulse Width: 0.7 ms
Lead Channel Setting Sensing Sensitivity: 0.3 mV
MDC IDC MSMT LEADCHNL RV IMPEDANCE VALUE: 1104 Ohm
MDC IDC SET ZONE DETECTION INTERVAL: 300 ms
MDC IDC SET ZONE DETECTION INTERVAL: 340 ms
Zone Setting Detection Interval: 240 ms
Zone Setting Detection Interval: 450 ms

## 2014-08-26 MED ORDER — ASPIRIN 81 MG PO CHEW: CHEWABLE_TABLET | ORAL | Status: DC

## 2014-08-26 MED ORDER — LISINOPRIL 20 MG PO TABS: 20.0000 mg | ORAL_TABLET | Freq: Every day | ORAL | Status: DC

## 2014-08-26 MED ORDER — NITROGLYCERIN 0.4 MG SL SUBL: 0.4000 mg | SUBLINGUAL_TABLET | SUBLINGUAL | Status: DC | PRN

## 2014-08-26 NOTE — Patient Instructions (Addendum)
Medication Instructions:  Your physician has recommended you make the following change in your medication:  1) DECREASE Aspirin to 81 mg - take one tablet on Monday, Wednesday, and Friday  Labwork: Your physician recommends that you return for lab work in: CMET, [place]um, Lipid profile, and uric acid  Testing/Procedures: None ordered  Follow-Up: Remote monitoring is used to monitor your ICD from home. This monitoring reduces the number of office visits required to check your device to one time per year. It allows [name] to keep an eye on the functioning of your device to ensure it is working properly. You are scheduled for a device check from home on 11/25/2014. You may send your transmission at any time that day. If you have a wireless device, the transmission will be sent automatically. After your physician reviews your transmission, you will receive a postcard with your next transmission date.  Your physician recommends that you schedule a follow-up appointment in:  6 months with Dr.Klein  Any Other Special Instructions Will Be Listed Below (If Applicable). Thank you for choosing [facility]!!

## 2014-08-26 NOTE — Progress Notes (Signed)
Patient Care Team: [name], MD as PCP - General (Family Medicine) Tobi Bastos, RN as [place] Management   HPI  [name] is a 74 y.o. 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 He has a history of bypass surgery.  His last nuclear perfusion study was in November 2013 which showed a large area of scar in the LAD territory (extent 44%) involving the mid to apical anterior, apical and infero-apical to mid infero-septal and apical lateral wall without associated ischemia. His last echo Doppler study was done on 01/01/2013. His left ventricle was mildly dilated. Ejection fraction was 25-30%. There was dyskinesis in scarring of the apical myocardium as well as akinesis of the mid anteroseptal, anterior and anterolateral walls consistent with his prior LAD infarction.    He has a history of paroxysmal atrial fibrillation and is on warfarin. hhe's hospitalized 5/16 and was started on dofetilide.       Past Medical History  Diagnosis Date  . Coronary artery disease     Hx MI 1992, CABG 1998 , Nuc study 11.2013 large scar but no ischemia  . 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 Atrial fib/flutter, transient   . GERD (gastroesophageal reflux disease)   . H. pylori infection     Hx of   . Cardiomyopathy, ischemic 2011    with EF 25-35% by echo  . H/O myocardial infarction, greater than 8 weeks 1992    large ant wall injury  . NSVT (nonsustained ventricular tachycardia)     Past Surgical History  Procedure Laterality Date  . Nasal sinus surgery  05/31/2010    Dr. Benjamine Mola  . Coronary artery bypass graft  1998    x 5  . Cataract extraction, bilateral    . Knee surgery      right  . Hernia repair    . Icd generator change  01/2008    medtronic, hx+ EP study    Current Outpatient Prescriptions  Medication Sig Dispense Refill  .  acetaminophen (TYLENOL) 650 MG CR tablet Take 650 mg by mouth 2 (two) times daily.     Marland Kitchen allopurinol (ZYLOPRIM) 300 MG tablet Take 150 mg by mouth daily as needed (Gout).     Marland Kitchen aspirin 81 MG chewable tablet Chew 1 tablet (81 mg total) by mouth daily.    . carvedilol (COREG) 6.25 MG tablet Take 1 tablet (6.25 mg total) by mouth 2 (two) times daily with a meal. 60 tablet 11  . cetirizine (ZYRTEC) 10 MG tablet Take 10 mg by mouth daily.      . Cholecalciferol (VITAMIN D) 2000 UNITS CAPS Take 1 capsule by mouth daily.    Marland Kitchen dofetilide (TIKOSYN) 500 MCG capsule Take 1 capsule (500 mcg total) by mouth every 12 (twelve) hours. 180 capsule 0  . esomeprazole (NEXIUM) 40 MG capsule Take 1 capsule (40 mg total) by mouth daily. 30 capsule 11  . glycopyrrolate (ROBINUL) 2 MG tablet Take 2 mg by mouth 2 (two) times daily as needed (stomach).    . hyoscyamine (LEVBID) 0.375 MG 12 hr tablet Take 0.375 mg by mouth 2 (two) times daily as needed for cramping.    . isosorbide mononitrate (IMDUR) 30 MG 24 hr tablet TAKE ONE-HALF TO ONE TABLET BY MOUTH ONCE DAILY 90 tablet 2  . lisinopril (PRINIVIL,ZESTRIL) 20 MG tablet Take 0.5 tablets (10  mg total) by mouth 2 (two) times daily. 90 tablet 3  . magnesium oxide (MAG-OX) 400 MG tablet Take 400 mg by mouth daily.    . mometasone (ASMANEX) 220 MCG/INH inhaler Inhale 1 puff into the lungs daily.     . mometasone (NASONEX) 50 MCG/ACT nasal spray Place 2 sprays into the nose daily as needed (allergies).     . montelukast (SINGULAIR) 10 MG tablet TAKE ONE TABLET BY MOUTH AT BEDTIME 90 tablet 1  . nitroGLYCERIN (NITROSTAT) 0.4 MG SL tablet Place 0.4 mg under the tongue every 5 (five) minutes as needed for chest pain.    . silodosin (RAPAFLO) 4 MG CAPS capsule Take 8 mg by mouth. Every 2 days    . simvastatin (ZOCOR) 40 MG tablet TAKE ONE TABLET BY MOUTH AT BEDTIME 90 tablet 3  . spironolactone (ALDACTONE) 25 MG tablet Take 0.5 tablets (12.5 mg total) by mouth daily. 30 tablet 11   . warfarin (COUMADIN) 5 MG tablet Take 1 tablet by mouth daily or as directed by coumadin clinic 90 tablet 0   No current facility-administered medications for this visit.    Allergies  Allergen Reactions  . Amoxicillin     unknown  . Penicillins     unknown    Review of Systems negative except from HPI and PMH  Physical Exam BP 108/70 mmHg  Pulse 59  Ht 5\' 7"  (1.702 m)  Wt 155 lb (70.308 kg)  BMI 24.27 kg/m2 Well developed and well nourished in no acute distress HENT normal E scleral and icterus clear Neck Supple JVP flat; carotids brisk and full Clear to ausculation Slwo but Regular rate and rhythm, no murmurs gallops or rub Soft with active bowel sounds No clubbing cyanosis none Edema Alert and oriented, grossly normal motor and sensory function Skin Warm and Dry  ECG demonstrates sinus rhythm at 49 intervals 18/13/50 with a QTC of 453 Axis left -70  Assessment and  Plan  Atrial fibrillation-paroxysmal  Sinus bradycardia  Ischemic cardiac myopathy  Congestive heart failure-chronic-systolic  Defibrillator-Medtronic-VVI  [name] is struggling with paroxysms of atrial fibrillation which are quite disruptiv  He is much improved on dofetilide.  We will check his surveillance laboratories today. He also asked that we check his lipids.  He is euvolemic.  No ischemia.  Blood pressure is well-controlled. He has had some daytime lightheadedness, so we will change is quinapril to taken all at night.  He remains unclear to me why he is on aspirin. He would like to reduce to 3 days a week. I would concur.

## 2014-08-30 DIAGNOSIS — J3089 Other allergic rhinitis: Secondary | ICD-10-CM | POA: Diagnosis not present

## 2014-08-31 ENCOUNTER — Other Ambulatory Visit (INDEPENDENT_AMBULATORY_CARE_PROVIDER_SITE_OTHER): Payer: Medicare Other | Admitting: *Deleted

## 2014-08-31 DIAGNOSIS — Z79899 Other long term (current) drug therapy: Secondary | ICD-10-CM | POA: Diagnosis not present

## 2014-08-31 DIAGNOSIS — E785 Hyperlipidemia, unspecified: Secondary | ICD-10-CM | POA: Diagnosis not present

## 2014-08-31 LAB — LIPID PANEL
CHOL/HDL RATIO: 4
CHOLESTEROL: 141 mg/dL (ref 0–200)
HDL: 32.9 mg/dL — ABNORMAL LOW (ref >39.00)
LDL CALC: 82 mg/dL (ref 0–99)
NONHDL: 108.42
Triglycerides: 132 mg/dL (ref 0.0–149.0)
VLDL: 26.4 mg/dL (ref 0.0–40.0)

## 2014-08-31 LAB — COMPREHENSIVE METABOLIC PANEL
ALT: 15 U/L (ref 0–53)
AST: 19 U/L (ref 0–37)
Albumin: 3.9 g/dL (ref 3.5–5.2)
Alkaline Phosphatase: 48 U/L (ref 39–117)
BILIRUBIN TOTAL: 0.7 mg/dL (ref 0.2–1.2)
BUN: 9 mg/dL (ref 6–23)
CALCIUM: 9.3 mg/dL (ref 8.4–10.5)
CHLORIDE: 103 meq/L (ref 96–112)
CO2: 30 meq/L (ref 19–32)
CREATININE: 0.81 mg/dL (ref 0.40–1.50)
GFR: 98.77 mL/min (ref >60.00)
GLUCOSE: 85 mg/dL (ref 70–99)
POTASSIUM: 3.9 meq/L (ref 3.5–5.1)
Sodium: 137 mEq/L (ref 135–145)
Total Protein: 6.9 g/dL (ref 6.0–8.3)

## 2014-08-31 LAB — MAGNESIUM: MAGNESIUM: 2.1 mg/dL (ref 1.5–2.5)

## 2014-08-31 LAB — URIC ACID: Uric Acid, Serum: 4.1 mg/dL (ref 4.0–7.8)

## 2014-08-31 NOTE — Addendum Note (Signed)
Addended by: Eulis Foster on: 08/31/2014 08:28 AM   Modules accepted: Orders

## 2014-08-31 NOTE — Addendum Note (Signed)
Addended by: Eulis Foster on: 08/31/2014 08:22 AM   Modules accepted: Orders

## 2014-09-06 DIAGNOSIS — J3089 Other allergic rhinitis: Secondary | ICD-10-CM | POA: Diagnosis not present

## 2014-09-14 DIAGNOSIS — J3089 Other allergic rhinitis: Secondary | ICD-10-CM | POA: Diagnosis not present

## 2014-09-14 DIAGNOSIS — J301 Allergic rhinitis due to pollen: Secondary | ICD-10-CM | POA: Diagnosis not present

## 2014-09-21 DIAGNOSIS — J3089 Other allergic rhinitis: Secondary | ICD-10-CM | POA: Diagnosis not present

## 2014-09-22 ENCOUNTER — Ambulatory Visit (INDEPENDENT_AMBULATORY_CARE_PROVIDER_SITE_OTHER): Payer: Medicare Other | Admitting: Pharmacist Clinician (PhC)/ Clinical Pharmacy Specialist

## 2014-09-22 DIAGNOSIS — I4891 Unspecified atrial fibrillation: Secondary | ICD-10-CM

## 2014-09-22 DIAGNOSIS — Z7901 Long term (current) use of anticoagulants: Secondary | ICD-10-CM

## 2014-09-22 DIAGNOSIS — I480 Paroxysmal atrial fibrillation: Secondary | ICD-10-CM | POA: Diagnosis not present

## 2014-09-22 LAB — POCT INR: INR: 2.3

## 2014-09-27 ENCOUNTER — Ambulatory Visit (INDEPENDENT_AMBULATORY_CARE_PROVIDER_SITE_OTHER): Payer: Medicare Other

## 2014-09-27 ENCOUNTER — Telehealth: Payer: Self-pay

## 2014-09-27 DIAGNOSIS — I5022 Chronic systolic (congestive) heart failure: Secondary | ICD-10-CM

## 2014-09-27 DIAGNOSIS — J3089 Other allergic rhinitis: Secondary | ICD-10-CM | POA: Diagnosis not present

## 2014-09-27 DIAGNOSIS — Z95810 Presence of automatic (implantable) cardiac defibrillator: Secondary | ICD-10-CM | POA: Diagnosis not present

## 2014-09-27 NOTE — Telephone Encounter (Signed)
ICM transmission received.  Attempted call to patient.  He reported he was sitting in MD office for allergy shot and requested to be called back within the next 30 minutes.

## 2014-09-27 NOTE — Telephone Encounter (Signed)
Spoke with patient.

## 2014-09-27 NOTE — Progress Notes (Signed)
EPIC Encounter for ICM Monitoring  Patient Name: [name] is a 74 y.o. male Date: 09/27/2014 Primary Care Physican: [name], MD Primary Cardiologist: [name] Electrophysiologist: [name] Dry Weight: 152 lbs       In the past month, have you:  1. Gained more than 2 pounds in a day or more than 5 pounds in a week? no  2. Had changes in your medications (with verification of current medications)? no  3. Had more shortness of breath than is usual for you? no  4. Limited your activity because of shortness of breath? no  5. Not been able to sleep because of shortness of breath? no  6. Had increased swelling in your feet or ankles? no  7. Had symptoms of dehydration (dizziness, dry mouth, increased thirst, decreased urine output) no  8. Had changes in sodium restriction? no  9. Been compliant with medication? Yes   ICM trend:   Follow-up plan: ICM clinic phone appointment 10/28/2014.  Optivol impedance trending along baseline. He denied any problems.  No changes today.  Copy of note sent to patient's primary care physician, primary cardiologist, and device following physician.  [name], RN, CCM 09/27/2014 10:08 AM

## 2014-09-28 ENCOUNTER — Encounter: Payer: Self-pay | Admitting: Cardiology

## 2014-10-05 DIAGNOSIS — H3531 Nonexudative age-related macular degeneration: Secondary | ICD-10-CM | POA: Diagnosis not present

## 2014-10-05 DIAGNOSIS — H5340 Unspecified visual field defects: Secondary | ICD-10-CM | POA: Diagnosis not present

## 2014-10-05 DIAGNOSIS — H26491 Other secondary cataract, right eye: Secondary | ICD-10-CM | POA: Diagnosis not present

## 2014-10-07 DIAGNOSIS — J301 Allergic rhinitis due to pollen: Secondary | ICD-10-CM | POA: Diagnosis not present

## 2014-10-07 DIAGNOSIS — J4540 Moderate persistent asthma, uncomplicated: Secondary | ICD-10-CM | POA: Diagnosis not present

## 2014-10-07 DIAGNOSIS — J3089 Other allergic rhinitis: Secondary | ICD-10-CM | POA: Diagnosis not present

## 2014-10-07 DIAGNOSIS — J3081 Allergic rhinitis due to animal (cat) (dog) hair and dander: Secondary | ICD-10-CM | POA: Diagnosis not present

## 2014-10-20 ENCOUNTER — Ambulatory Visit (INDEPENDENT_AMBULATORY_CARE_PROVIDER_SITE_OTHER): Payer: Medicare Other | Admitting: Pharmacist Clinician (PhC)/ Clinical Pharmacy Specialist

## 2014-10-20 DIAGNOSIS — Z7901 Long term (current) use of anticoagulants: Secondary | ICD-10-CM

## 2014-10-20 DIAGNOSIS — I480 Paroxysmal atrial fibrillation: Secondary | ICD-10-CM | POA: Diagnosis not present

## 2014-10-20 DIAGNOSIS — I4891 Unspecified atrial fibrillation: Secondary | ICD-10-CM

## 2014-10-20 LAB — POCT INR: INR: 2.8

## 2014-10-21 ENCOUNTER — Telehealth: Payer: Self-pay | Admitting: *Deleted

## 2014-10-21 NOTE — Telephone Encounter (Signed)
Called [phone] to order patients tikosyn. His id number is [id number]. Should arrive to the office in 7-10 days and the order number is [id number].

## 2014-10-28 ENCOUNTER — Ambulatory Visit (INDEPENDENT_AMBULATORY_CARE_PROVIDER_SITE_OTHER): Payer: Medicare Other

## 2014-10-28 ENCOUNTER — Telehealth: Payer: Self-pay | Admitting: Cardiology

## 2014-10-28 DIAGNOSIS — I5022 Chronic systolic (congestive) heart failure: Secondary | ICD-10-CM | POA: Diagnosis not present

## 2014-10-28 DIAGNOSIS — Z95810 Presence of automatic (implantable) cardiac defibrillator: Secondary | ICD-10-CM | POA: Diagnosis not present

## 2014-10-28 NOTE — Telephone Encounter (Signed)
LMOVM reminding pt to send remote transmission.   

## 2014-10-29 ENCOUNTER — Telehealth: Payer: Self-pay | Admitting: Cardiovascular Disease

## 2014-10-29 NOTE — Telephone Encounter (Signed)
Started new medication and would like to discuss if there are any precautions with this med

## 2014-10-29 NOTE — Telephone Encounter (Signed)
Kristin's recommendations and concerns communicated to patient - he voiced understanding, will call allergist for new Rx.

## 2014-10-29 NOTE — Progress Notes (Signed)
EPIC Encounter for ICM Monitoring  Patient Name: [name] is a 74 y.o. male Date: 10/29/2014 Primary Care Physican: [name], MD Primary Cardiologist: [name] Electrophysiologist: [name] Dry Weight: 152 lb       In the past month, have you:  1. Gained more than 2 pounds in a day or more than 5 pounds in a week? no  2. Had changes in your medications (with verification of current medications)? no  3. Had more shortness of breath than is usual for you? no  4. Limited your activity because of shortness of breath? no  5. Not been able to sleep because of shortness of breath? no  6. Had increased swelling in your feet or ankles? no  7. Had symptoms of dehydration (dizziness, dry mouth, increased thirst, decreased urine output) no  8. Had changes in sodium restriction? no  9. Been compliant with medication? Yes   ICM trend:   Follow-up plan: ICM clinic phone appointment 11/29/2014.   Patient reported no HF symptoms but was prescribed an antibiotic for allergy and Dr Evette Georges office was calling him as we were speaking to inform him if the antibiotic would interact with Tikosyn.  No changes today.   Copy of note sent to patient's primary care physician, primary cardiologist, and device following physician.  [name], RN, CCM 10/29/2014 11:07 AM

## 2014-10-29 NOTE — Telephone Encounter (Signed)
Closed by accident  -  Started new medication and would like to discuss if there are any precautions with this med

## 2014-10-29 NOTE — Telephone Encounter (Addendum)
Patient was prescribed azithromycin (Z-Pack) for URI, wanted to make sure this would not interact w/ meds.  Advised this should be OK, will verify w/ pharmD and return call if any precautions indicated.

## 2014-10-29 NOTE — Telephone Encounter (Signed)
Azithromycin is contra-indicated for people on Tikosyn.  Would recommend he get different antibiotic.  Pt has allergy to penicillins, so maybe try doxycycline or a cephalosporin, depending on what is being treated.

## 2014-11-08 DIAGNOSIS — J4540 Moderate persistent asthma, uncomplicated: Secondary | ICD-10-CM | POA: Diagnosis not present

## 2014-11-08 DIAGNOSIS — H66002 Acute suppurative otitis media without spontaneous rupture of ear drum, left ear: Secondary | ICD-10-CM | POA: Diagnosis not present

## 2014-11-08 DIAGNOSIS — J019 Acute sinusitis, unspecified: Secondary | ICD-10-CM | POA: Diagnosis not present

## 2014-11-08 DIAGNOSIS — J3089 Other allergic rhinitis: Secondary | ICD-10-CM | POA: Diagnosis not present

## 2014-11-10 ENCOUNTER — Ambulatory Visit (INDEPENDENT_AMBULATORY_CARE_PROVIDER_SITE_OTHER): Payer: Medicare Other | Admitting: Pharmacist

## 2014-11-10 DIAGNOSIS — I4891 Unspecified atrial fibrillation: Secondary | ICD-10-CM

## 2014-11-10 DIAGNOSIS — I480 Paroxysmal atrial fibrillation: Secondary | ICD-10-CM

## 2014-11-10 DIAGNOSIS — Z7901 Long term (current) use of anticoagulants: Secondary | ICD-10-CM

## 2014-11-10 LAB — POCT INR: INR: 3.3

## 2014-11-11 ENCOUNTER — Telehealth: Payer: Self-pay | Admitting: Internal Medicine

## 2014-11-11 DIAGNOSIS — I480 Paroxysmal atrial fibrillation: Secondary | ICD-10-CM

## 2014-11-11 NOTE — Telephone Encounter (Signed)
Patient c/o Palpitations:  High priority if patient c/o lightheadedness and shortness of breath.  1. How long have you been having palpitations? 8:00 this morning  2. Are you currently experiencing lightheadedness and shortness of breath? Pt didn't answer directly and states he just doesn't feel right  3. Have you checked your BP and heart rate? (document readings) BP is ok HR 116 usually 60  4. Are you experiencing any other symptoms? Pt states he is in A-fib and it is coming and going, pt also states he is on Doxycycline for a sinus infection and is also on Tykosin and someone needs to call him soon

## 2014-11-12 ENCOUNTER — Ambulatory Visit (INDEPENDENT_AMBULATORY_CARE_PROVIDER_SITE_OTHER): Payer: Medicare Other | Admitting: *Deleted

## 2014-11-12 ENCOUNTER — Encounter: Payer: Self-pay | Admitting: *Deleted

## 2014-11-12 VITALS — BP 110/60 | HR 58 | Resp 18

## 2014-11-12 DIAGNOSIS — I480 Paroxysmal atrial fibrillation: Secondary | ICD-10-CM

## 2014-11-12 NOTE — Telephone Encounter (Signed)
Reviewed with Dr. [name]. He feels there could be a small chance that the patient's a-fib was triggered by his cough. Per Dr. [name]- increase coreg to 12.5 mg BID over the weekend for rate control and plan for DCCV on Monday. If the patient's cough is the trigger, we should let this settle down over the weekend. I have notified the patient of this. He will come by for an EKG today and pre-procedure labs. Will schedule DCCV for Monday.

## 2014-11-12 NOTE — Progress Notes (Signed)
1.) Reason for visit: EKG  2.) Name of MD requesting visit: [name]  3.) H&P: Patient called today reporting he was in a-fib starting yesterday morning. He is currently on Tikosyn. He has been dealing with a sinus infection and cough for the last few days. Per MD- slight chance the cough could have triggered his a-fib. HR's have been in the 120's at home. Per Dr. [name]- plan for DCCV on Monday. The patient came in this afternoon to confirm rhythm and have pre-procedure labs  4.) ROS related to problem: The patient has been stable with his a-fib. His only complaint has been some fatigue.   5.) Assessment and plan per MD: EKG obtained- he is currently sinus brady with HR- 58. BP 110/60. Advised the patient I will cancel his DCCV for Monday and that he is to call with prolonged periods of a-fib. He is agreeable. If he does go in a-fib over the weekend and his rates are elevated, he will increase his coreg to 12.5 mg BID per Dr. [name]. If his a-fib re-occurs and is persistent, will reschedule DCCV.

## 2014-11-12 NOTE — Telephone Encounter (Signed)
Cardioversion scheduled for Monday 11/15/14 at 11:00 am with Dr. Oval Linsey.

## 2014-11-12 NOTE — Telephone Encounter (Signed)
I spoke with the patient. I apologized I did not receive this message yesterday as it was inadvertently sent to the wrong person. The patient states that yesterday morning about 8:00 am, he was went in to a-fib. He has been dealing with a sinus infection and cough and was placed on doxycycline about 4-5 days ago. His reported HR yesterday was initially around 116 bpm. He states he is still out of rhythm today and that his HR is now 122 bpm BP- 110/58. He is currently on Tikosyn. The patient states he is going to be near our office around 10:00 am and would like to come by. I advised him I will speak with Dr. [name] when he is out of a room and call him back. He is agreeable.

## 2014-11-12 NOTE — Telephone Encounter (Signed)
Follow up     Patient calling to speak with nurse

## 2014-11-15 ENCOUNTER — Encounter (HOSPITAL_COMMUNITY): Admission: RE | Payer: Self-pay | Source: Ambulatory Visit

## 2014-11-15 ENCOUNTER — Ambulatory Visit (HOSPITAL_COMMUNITY): Admission: RE | Admit: 2014-11-15 | Payer: Medicare Other | Source: Ambulatory Visit | Admitting: Cardiovascular Disease

## 2014-11-15 ENCOUNTER — Ambulatory Visit: Payer: Medicare Other | Admitting: Pharmacist Clinician (PhC)/ Clinical Pharmacy Specialist

## 2014-11-15 SURGERY — CARDIOVERSION
Anesthesia: Monitor Anesthesia Care

## 2014-11-17 ENCOUNTER — Ambulatory Visit (INDEPENDENT_AMBULATORY_CARE_PROVIDER_SITE_OTHER): Payer: Medicare Other | Admitting: Pharmacist Clinician (PhC)/ Clinical Pharmacy Specialist

## 2014-11-17 DIAGNOSIS — Z7901 Long term (current) use of anticoagulants: Secondary | ICD-10-CM | POA: Diagnosis not present

## 2014-11-17 DIAGNOSIS — I4891 Unspecified atrial fibrillation: Secondary | ICD-10-CM | POA: Diagnosis not present

## 2014-11-17 DIAGNOSIS — I480 Paroxysmal atrial fibrillation: Secondary | ICD-10-CM

## 2014-11-17 LAB — POCT INR: INR: 2.4

## 2014-11-22 DIAGNOSIS — J0101 Acute recurrent maxillary sinusitis: Secondary | ICD-10-CM | POA: Diagnosis not present

## 2014-11-22 DIAGNOSIS — J310 Chronic rhinitis: Secondary | ICD-10-CM | POA: Diagnosis not present

## 2014-11-24 ENCOUNTER — Ambulatory Visit (INDEPENDENT_AMBULATORY_CARE_PROVIDER_SITE_OTHER): Payer: Medicare Other | Admitting: Physician Assistant

## 2014-11-24 ENCOUNTER — Encounter: Payer: Self-pay | Admitting: Physician Assistant

## 2014-11-24 ENCOUNTER — Telehealth: Payer: Self-pay | Admitting: Internal Medicine

## 2014-11-24 ENCOUNTER — Telehealth: Payer: Self-pay | Admitting: Cardiology

## 2014-11-24 VITALS — BP 120/70 | HR 108 | Ht 67.0 in | Wt 150.8 lb

## 2014-11-24 DIAGNOSIS — I255 Ischemic cardiomyopathy: Secondary | ICD-10-CM

## 2014-11-24 DIAGNOSIS — I4891 Unspecified atrial fibrillation: Secondary | ICD-10-CM

## 2014-11-24 MED ORDER — RANOLAZINE ER 500 MG PO TB12: 500.0000 mg | ORAL_TABLET | Freq: Two times a day (BID) | ORAL | Status: DC

## 2014-11-24 MED ORDER — CARVEDILOL 6.25 MG PO TABS: ORAL_TABLET | ORAL | Status: DC

## 2014-11-24 NOTE — Telephone Encounter (Signed)
Spoke with Rosaria Ferries PA-C who is Flex today to inform her of pts complaints and Ellen Henri PA-C plan, and per Suanne Marker she agrees to see the pt at 2:30 pm.  Per Suanne Marker she request that I ask Dr [name] to come down to see the pt at 2:30 pm once her assessment is complete.  Informed Dr [name] of this plan and the pt being added on to the Flex schedule at 2:30 pm, and per Dr [name], he agrees to come and see the pt after Suanne Marker assesses him.  Informed the pt that he needs to come to our office on [facility], where he see's Dr [name], at 2:30 pm.  Informed the pt he will be seeing our Flex Rosaria Ferries PA-C today, and Dr [name] will come down to the pod to speak with him.  Informed the pt that per Suanne Marker, the pt is to have someone drive him to the office, for he does not need to be driving with light-headedness.  Also informed the pt that he should eat a very light breakfast. Informed the pt if he can tolerate, he should do a clear liquid breakfast and then hold off on any other foods until he see's Rhonda at 2:30 pm.  Informed the pt this is just incase he will need to be cardioverted today.  Pt verbalized understanding, agrees with this plan, and gracious for all the assistance provided.

## 2014-11-24 NOTE — Telephone Encounter (Signed)
  Patient with a h/o PAF on Tikosyn, Coreg and Coumadin. He was scheduled for a DCCV 11/15/14 but it was canceled because he spontaneously converted back to NSR. He called after hours stating that he is back in afib. He notes palpitations. No dyspnea. No syncope/ near syncope. He reports full medication compliance. He feels stable. Patient advised to call the office at 8:00 am to speak with scheduler to see about getting an appointment either in Flex or the Afib clinic.   [name]

## 2014-11-24 NOTE — Patient Instructions (Addendum)
Medication Instructions:  Your physician has recommended you make the following change in your medication:  1.  START the Ranexa 500 mg, take 1 tablet twice a day 2.  INCREASE the Coreg to 6.25 tablet taking 1 1/2 tablet twice a day  Labwork: None ordered  Testing/Procedures: None ordered  Follow-Up: Your physician recommends that you schedule a follow-up appointment with DR. ALLRED, 1ST AVAILABLE, FOR AFIB ABLATION

## 2014-11-24 NOTE — Progress Notes (Signed)
Cardiology Office Note   Date:  11/24/2014   ID:  Trinity, Hyland 1940-11-23, MRN [id number]  PCP:  [name], MD  Cardiologist:  Dr [name] Electrophysiologist: Dr. Suzie Portela, PA-C   Chief Complaint  Patient presents with  . Palpitations  . Atrial Fibrillation    History of Present Illness: [name] is a 74 y.o. male with a history of MDT ICD implantation for primary prevention, CABG 1998, ICM with an EF of 25-30% 03/2014, PAF on Tikosyn and warfarin  [name] presents for evaluation of his atrial fibrillation.  Recently he got a sinus infection and was initially placed on azithromycin. However, this was changed to doxycycline because of the Tikosyn. After he completed antibiotics, he began having atrial fibrillation episodes. He feels that he is getting them just about every day. They will last a few hours and then he feels it goes back into rhythm. The episodes make him feel uncomfortable. He is generally fatigued from it. His heart rate is generally elevated. He will feel lightheaded at times. He has taken an extra half tablet of Coreg and thinks that may have helped. He is concerned about what to do. He was scheduled for an cardioversion, but when he came to the hospital for the procedure, he was in sinus rhythm and it was canceled.   Currently he is in atrial fibrillation with a heart rate between 101 and 110. He has not had chest pain. He has not had lower extremity edema or shortness of breath.   Past Medical History  Diagnosis Date  . Coronary artery disease     Hx MI 1992, CABG 1998 , Nuc study 11.2013 large scar but no ischemia  . 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 Atrial fib/flutter, transient   . GERD (gastroesophageal reflux disease)   . H. pylori infection     Hx of   . Cardiomyopathy, ischemic 2011    with EF 25-35% by echo  . H/O myocardial infarction, greater than 8 weeks  1992    large ant wall injury  . NSVT (nonsustained ventricular tachycardia) [facility])     Past Surgical History  Procedure Laterality Date  . Nasal sinus surgery  05/31/2010    Dr. Benjamine Mola  . Coronary artery bypass graft  1998    x 5  . Cataract extraction, bilateral    . Knee surgery      right  . Hernia repair    . Icd generator change  01/2008    medtronic, hx+ EP study    Current Outpatient Prescriptions  Medication Sig Dispense Refill  . acetaminophen (TYLENOL) 650 MG CR tablet Take 650 mg by mouth 2 (two) times daily.     Marland Kitchen allopurinol (ZYLOPRIM) 300 MG tablet Take 150 mg by mouth daily as needed (Gout).     Marland Kitchen aspirin 81 MG chewable tablet Chew 1 tablet (81 mg total) by mouth on Monday, Wednesday, & Friday. 12 tablet 11  . carvedilol (COREG) 6.25 MG tablet Take 1 tablet (6.25 mg total) by mouth 2 (two) times daily with a meal. 60 tablet 11  . cetirizine (ZYRTEC) 10 MG tablet Take 10 mg by mouth daily.      . Cholecalciferol (VITAMIN D) 2000 UNITS CAPS Take 1 capsule by mouth daily.    Marland Kitchen dofetilide (TIKOSYN) 500 MCG capsule Take 1 capsule (500 mcg total) by mouth every 12 (twelve) hours. [place]  capsule 0  . esomeprazole (NEXIUM) 40 MG capsule Take 1 capsule (40 mg total) by mouth daily. 30 capsule 11  . glycopyrrolate (ROBINUL) 2 MG tablet Take 2 mg by mouth 2 (two) times daily as needed (stomach).    . isosorbide mononitrate (IMDUR) 30 MG 24 hr tablet TAKE ONE-HALF TO ONE TABLET BY MOUTH ONCE DAILY 90 tablet 2  . lisinopril (PRINIVIL,ZESTRIL) 20 MG tablet Take 1 tablet (20 mg total) by mouth at bedtime. 90 tablet 3  . magnesium oxide (MAG-OX) 400 MG tablet Take 400 mg by mouth daily.    . mometasone (ASMANEX) 220 MCG/INH inhaler Inhale 1 puff into the lungs daily.     . mometasone (NASONEX) 50 MCG/ACT nasal spray Place 2 sprays into the nose daily as needed (allergies).     . montelukast (SINGULAIR) 10 MG tablet TAKE ONE TABLET BY MOUTH AT BEDTIME 90 tablet 1  . nitroGLYCERIN  (NITROSTAT) 0.4 MG SL tablet Place 1 tablet (0.4 mg total) under the tongue every 5 (five) minutes as needed for chest pain. 25 tablet 1  . silodosin (RAPAFLO) 4 MG CAPS capsule Take 8 mg by mouth. Every 2 days    . simvastatin (ZOCOR) 40 MG tablet TAKE ONE TABLET BY MOUTH AT BEDTIME 90 tablet 3  . spironolactone (ALDACTONE) 25 MG tablet Take 0.5 tablets (12.5 mg total) by mouth daily. 30 tablet 11  . warfarin (COUMADIN) 5 MG tablet Take 1 tablet by mouth daily or as directed by coumadin clinic 90 tablet 0   No current facility-administered medications for this visit.    Allergies:   Amoxicillin and Penicillins    Social History:  The patient  reports that he has never smoked. He has never used smokeless tobacco. He reports that he drinks about 1.2 oz of alcohol per week. He reports that he does not use illicit drugs.   Family History:  The patient's family history includes Healthy in his brother and sister; Heart disease in his father; Heart failure in his sister; Parkinsonism in his brother; Stroke in his mother. There is no history of Colon cancer.    ROS:  Please see the history of present illness. All other systems are reviewed and negative.    PHYSICAL EXAM: VS:  BP 120/70 mmHg  Pulse 108  Ht 5\' 7"  (1.702 m)  Wt 150 lb 12.8 oz (68.402 kg)  BMI 23.61 kg/m2 , BMI Body mass index is 23.61 kg/(m^2). GEN: Well nourished, well developed, male in no acute distress HEENT: normal for age  Neck: no JVD, no carotid bruit, no masses Cardiac: Rapid and irregular; no murmur, no rubs, or gallops Respiratory:  clear to auscultation bilaterally, normal work of breathing GI: soft, nontender, nondistended, + BS MS: no deformity or atrophy; no edema; distal pulses are 2+ in all 4 extremities  Skin: warm and dry, no rash Neuro:  Strength and sensation are intact Psych: euthymic mood, full affect   EKG:  EKG is ordered today. The ekg ordered today demonstrates atrial fibrillation, rapid  ventricular response  ECHO: 03/2014 Conclusions - Left ventricle: The cavity size was mildly dilated. Systolic function was severely reduced. The estimated ejection fraction was in the range of 20% to 25%. Dyskinesis and scarring of the apical myocardium. Akinesis and scarring of the mid-apicalanteroseptal, anterior, and anterolateral myocardium; consistent with infarction in the distribution of the left anterior descending coronary artery; unchanged from the study of 01/01/2013. There was a reduced contribution of atrial contraction to ventricular filling, due  to increased ventricular diastolic pressure or atrial contractile dysfunction. Features are consistent with a pseudonormal left ventricular filling pattern, with concomitant abnormal relaxation and increased filling pressure (grade 2 diastolic dysfunction). Cannot exclude apical thrombus. - Aortic valve: There was trivial regurgitation. - Mitral valve: There was moderate regurgitation directed centrally. - Left atrium: The atrium was moderately to severely dilated. - Right ventricle: Systolic function was mildly reduced. - Atrial septum: The septum bowed from left to right, consistent with increased left atrial pressure. No defect or patent foramen ovale was identified.  Recent Labs: 05/31/2014: B Natriuretic Peptide 648.3* 06/01/2014: Hemoglobin 14.5; Platelets 232 08/31/2014: ALT 15; BUN 9; Creatinine, Ser 0.81; Magnesium 2.1; Potassium 3.9; Sodium 137    Lipid Panel    Component Value Date/Time   CHOL 141 08/31/2014 0822   TRIG 132.0 08/31/2014 0822   HDL 32.90* 08/31/2014 0822   CHOLHDL 4 08/31/2014 0822   VLDL 26.4 08/31/2014 0822   LDLCALC 82 08/31/2014 0822     Wt Readings from Last 3 Encounters:  11/24/14 150 lb 12.8 oz (68.402 kg)  08/26/14 155 lb (70.308 kg)  07/13/14 150 lb (68.04 kg)     Other studies Reviewed: Additional studies/ records that were reviewed today include:  Previous office notes, hospital records and echo reports.  ASSESSMENT AND PLAN:  1.  Atrial fibrillation, rapid ventricular response, paroxysmal: Dr. [name] was able to step in and see the patient. He is symptomatic from the atrial fibrillation and we should pursue rhythm control. To that end, he will be started on Ranexa 500 mg twice a day, samples obtained from the [place] office. He is to continue the dofetilide for now. He will increase his carvedilol to 6.25 mg, 1-1/2 tablets twice a day. He is to remain on warfarin anticoagulation.  He will be referred for possible atrial fibrillation ablation.  Current medicines are reviewed at length with the patient today.  The patient does not have concerns regarding medicines.  The following changes have been made:  Increase carvedilol, add Ranexa  Labs/ tests ordered today include:   Orders Placed This Encounter  Procedures  . EKG 12-Lead     Disposition:   FU with Dr. Rayann Heman for A. fib ablation  Signed, Lenoard Aden  11/24/2014 5:48 PM    [address], [place], [place]  [address] Phone: [phone]; Fax: [phone]

## 2014-11-24 NOTE — Telephone Encounter (Signed)
Patient c/o Palpitations:  High priority if patient c/o lightheadedness and shortness of breath.  1. How long have you been having palpitations? Pt in afib since yesterday  2. Are you currently experiencing lightheadedness and shortness of breath?Yes both  3. Have you checked your BP and heart rate? (document readings)  Hr was 132 minutes ago   4. Are you experiencing any other symptoms?

## 2014-11-24 NOTE — Telephone Encounter (Signed)
Follow up    Patient had called the answer services x 2 and no-one called him .    Patient states today he woke up with Afib,.

## 2014-11-24 NOTE — Telephone Encounter (Signed)
Pt calling in with complaints of being back in afib.  Pt states his HR is ranging anywhere from 115-130s.  Pt states he is unable to obtain a BP, for his wrist monitor will not register this.  Pt states that he is having palpitations, fluttering sensations in his chest, and light headedness. Pt c/o no sob at this time. Pt states that he called the on-call PA last night, and spoke with Ellen Henri PA-C about his complaints and she advised him to call the office this morning to report symptoms and be added on to the flex schedule.  Pt was scheduled to have a DCCV on 10/17, but this was cancelled because the pt spontaneously converted back to NSR.   Pt states he is being compliant with taking all his medications.  Pt reports he is stable at this time, but can "just tell that I'm back in it." Informed the pt that I will go and speak with our Flex Rhonda Barrett PA-C to report the pts complaints, [place] PA plan, and call him back thereafter with recommendations.   Informed the pt that I will also go and speak with Dr [name] about the pt, for he is in the office all day too.  Pt verbalized understanding and agrees with this plan.

## 2014-11-29 ENCOUNTER — Telehealth: Payer: Self-pay

## 2014-11-29 ENCOUNTER — Ambulatory Visit (INDEPENDENT_AMBULATORY_CARE_PROVIDER_SITE_OTHER): Payer: Medicare Other | Admitting: *Deleted

## 2014-11-29 DIAGNOSIS — I5022 Chronic systolic (congestive) heart failure: Secondary | ICD-10-CM

## 2014-11-29 DIAGNOSIS — Z95810 Presence of automatic (implantable) cardiac defibrillator: Secondary | ICD-10-CM | POA: Diagnosis not present

## 2014-11-29 DIAGNOSIS — I255 Ischemic cardiomyopathy: Secondary | ICD-10-CM | POA: Diagnosis not present

## 2014-11-29 NOTE — Telephone Encounter (Signed)
ICM transmission received.  Attempted patient call and he stated he was in a meeting.  Will attempt call back tomorrow.

## 2014-11-30 NOTE — Telephone Encounter (Signed)
Spoke with patient.

## 2014-11-30 NOTE — Telephone Encounter (Signed)
F/u    Pt returning Laurie's phone call.

## 2014-11-30 NOTE — Progress Notes (Signed)
EPIC Encounter for ICM Monitoring  Patient Name: [name] is a 74 y.o. male Date: 11/30/2014 Primary Care Physican: [name], MD Primary Cardiologist: [name] Electrophysiologist: [name] Dry Weight: 152 lb       In the past month, have you:  1. Gained more than 2 pounds in a day or more than 5 pounds in a week? no  2. Had changes in your medications (with verification of current medications)? no  3. Had more shortness of breath than is usual for you? no  4. Limited your activity because of shortness of breath? no  5. Not been able to sleep because of shortness of breath? no  6. Had increased swelling in your feet or ankles? no  7. Had symptoms of dehydration (dizziness, dry mouth, increased thirst, decreased urine output) no  8. Had changes in sodium restriction? no  9. Been compliant with medication? Yes   ICM trend: 11/29/2014   Follow-up plan: ICM clinic phone appointment on 01/11/2015 and consult appointment with Dr Rayann Heman 12/13/2014.  Optivol impedance trending along baseline.  He denied any HF symptoms but reported he continues to have Afib which makes him feel tired.  He will consult with Dr Rayann Heman to discuss treatment options.  He requested Ranexa samples and 2 weeks supply given for him to pick up at the office.  Advised if he continues on Ranexa after the consult with Dr Rayann Heman he can obtain more if available.  No changes today.  Copy of note sent to patient's primary care physician, primary cardiologist, and device following physician.  [name], RN, CCM 11/30/2014 10:27 AM

## 2014-12-01 ENCOUNTER — Telehealth: Payer: Self-pay | Admitting: Internal Medicine

## 2014-12-01 MED ORDER — CARVEDILOL 6.25 MG PO TABS: 6.2500 mg | ORAL_TABLET | Freq: Two times a day (BID) | ORAL | Status: DC

## 2014-12-01 NOTE — Telephone Encounter (Signed)
Patient c/o Palpitations:  High priority if patient c/o lightheadedness and shortness of breath.  1. How long have you been having palpitations? Last wednesday  2. Are you currently experiencing lightheadedness and shortness of breath?lightheadness  3. Have you checked your BP and heart rate? (document readings) No  4. Are you experiencing any other symptoms?No

## 2014-12-01 NOTE — Telephone Encounter (Signed)
Pt states that he has been having light headedness and sometime his heart rate goes up to 129 beats/minute. Pt states since he started taking Ranexa 500 mg BID he has been experiencing lightheadedness feeling uncomfortable pulse 129 beats/minute once a day;  before starting the Ranexa these symptoms were not as often as now. Pt's BP this morning was 95/57. Pt took the Ranexa 500 mg dose this AM. Pt states his BP is usually 110/60. Pt would like to know if he can stop or has Ranexa dose decreased. Dr. Lovena Le is aware of pt's symptoms of low BP an lightheadedness. MD recommends for pt to decreased dose of Coreg to 6.25 mg twice a day. Pt now states that he is having more A-fib episodes several times a day since started Ranexa 500 mg. He would like to know if the Ranexa is causing it.  Dr. Lovena Le is aware of pt's concerns. MD recommends to continue taking Ranexa, because Dr. [name] prescribed this medication to help with the A-fib. Pt is aware. Pt  is also aware also to keep his appointment with Dr. Jackalyn Lombard consultation for ablation. Pt verbalized understanding.

## 2014-12-03 ENCOUNTER — Telehealth: Payer: Self-pay | Admitting: Internal Medicine

## 2014-12-03 LAB — CUP PACEART REMOTE DEVICE CHECK
Brady Statistic RV Percent Paced: 0.23 %
HIGH POWER IMPEDANCE MEASURED VALUE: 55 Ohm
HighPow Impedance: 82 Ohm
Implantable Lead Implant Date: 20010806
Implantable Lead Location: 753860
Lead Channel Impedance Value: 1200 Ohm
Lead Channel Setting Pacing Amplitude: 2.5 V
Lead Channel Setting Pacing Pulse Width: 0.7 ms
MDC IDC LEAD MODEL: 147
MDC IDC LEAD SERIAL: 104581
MDC IDC MSMT BATTERY VOLTAGE: 2.89 V
MDC IDC SESS DTM: 20161031113649
MDC IDC SET LEADCHNL RV SENSING SENSITIVITY: 0.3 mV

## 2014-12-03 NOTE — Telephone Encounter (Signed)
Reviewed the patient's symptoms with Dr. [name]- recommendations were to increase coreg to 12.5 mg BID for rate control. I called the patient and notified him of Dr. Olin Pia recommendations. I also advised that Dr. Rayann Heman had a cancellation for Monday 12/06/14 and that we could move his appointment up.  He was agreeable coming in sooner, but he feels like his BP may not be able to tolerate the higher dose of coreg. He states he already feels dizzy/ lightheaded somewhat. I advised him that if he feels like his BP can tolerate an increased dose if his rates go up over the weekend, then to go ahead and take the additional dose. He checked his BP/ HR while on the phone with me- BP 103/61 HR- 65 (NSR).

## 2014-12-03 NOTE — Telephone Encounter (Signed)
Patient c/o Palpitations:  High priority if patient c/o lightheadedness and shortness of breath.  1. How long have you been having palpitations? Several days  2. Are you currently experiencing lightheadedness and shortness of breath? Little Sob  3. Have you checked your BP and heart rate? (document readings) HR 129  4. Are you experiencing any other symptoms? no

## 2014-12-06 ENCOUNTER — Encounter: Payer: Self-pay | Admitting: Internal Medicine

## 2014-12-06 ENCOUNTER — Encounter: Payer: Self-pay | Admitting: Cardiology

## 2014-12-06 ENCOUNTER — Ambulatory Visit (INDEPENDENT_AMBULATORY_CARE_PROVIDER_SITE_OTHER): Payer: Medicare Other | Admitting: Internal Medicine

## 2014-12-06 VITALS — BP 116/62 | HR 53 | Ht 67.0 in | Wt 152.6 lb

## 2014-12-06 DIAGNOSIS — I25709 Atherosclerosis of coronary artery bypass graft(s), unspecified, with unspecified angina pectoris: Secondary | ICD-10-CM | POA: Diagnosis not present

## 2014-12-06 DIAGNOSIS — I5022 Chronic systolic (congestive) heart failure: Secondary | ICD-10-CM | POA: Diagnosis not present

## 2014-12-06 DIAGNOSIS — I4891 Unspecified atrial fibrillation: Secondary | ICD-10-CM | POA: Diagnosis not present

## 2014-12-06 DIAGNOSIS — I255 Ischemic cardiomyopathy: Secondary | ICD-10-CM | POA: Diagnosis not present

## 2014-12-06 LAB — CUP PACEART INCLINIC DEVICE CHECK
Brady Statistic RV Percent Paced: 0.2 %
HIGH POWER IMPEDANCE MEASURED VALUE: 55 Ohm
HighPow Impedance: 82 Ohm
Implantable Lead Implant Date: 20010806
Implantable Lead Serial Number: 104581
Lead Channel Impedance Value: 1104 Ohm
Lead Channel Pacing Threshold Amplitude: 1 V
Lead Channel Pacing Threshold Pulse Width: 0.7 ms
Lead Channel Sensing Intrinsic Amplitude: 18.637 mV
Lead Channel Setting Pacing Amplitude: 2.5 V
Lead Channel Setting Pacing Pulse Width: 0.7 ms
MDC IDC LEAD LOCATION: 753860
MDC IDC LEAD MODEL: 147
MDC IDC MSMT BATTERY VOLTAGE: 2.89 V
MDC IDC SESS DTM: 20161107121916
MDC IDC SET LEADCHNL RV SENSING SENSITIVITY: 0.3 mV

## 2014-12-06 NOTE — Progress Notes (Signed)
Electrophysiology Office Note   Date:  12/07/2014   ID:  [name], [name] [birth year], MRN [id number]  PCP:  [name], MD  Primary Electrophysiologist: Dr [name]  Chief Complaint  Patient presents with  . PAF     History of Present Illness: [name] is a 74 y.o. male who presents today for electrophysiology follow-up.   He has a h/o of CAD s/p CABG 1998, ischemic CMP EF 20-25% s/p ICD, and PAF anticoagulated with warfarin. His AF has recently increased in frequency and duration.  He does not tolerate his AF very well.  He has tried amiodarone in the distant past.   More recently, he was placed on tikosyn.  When tikosyn proved ineffective, ranexa was added.  He does not feel that this has been successful.   CHADS-VASC = 5. He is on coumadin. On ASA, metoprolol, statin, ACE-I.He presents today for consideration of other AF options.  Today, he denies symptoms of palpitations, chest pain, shortness of breath, orthopnea, PND, lower extremity edema, claudication, dizziness, presyncope, syncope, bleeding, or neurologic sequela. The patient is tolerating medications without difficulties and is otherwise without complaint today.    Past Medical History  Diagnosis Date  . Coronary artery disease     Hx MI 1992, CABG 1998 , Nuc study 11.2013 large scar but no ischemia  . 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 Paroxysmal atrial fibrillation (HCC)   . GERD (gastroesophageal reflux disease)   . H. pylori infection     Hx of   . Cardiomyopathy, ischemic 2011    with EF 25-35% by echo  . H/O myocardial infarction, greater than 8 weeks 1992    large ant wall injury  . NSVT (nonsustained ventricular tachycardia) [facility])    Past Surgical History  Procedure Laterality Date  . Nasal sinus surgery  05/31/2010    Dr. Benjamine Mola  . Coronary artery bypass graft  1998    x 5  . Cataract extraction, bilateral    . Knee surgery      right    . Hernia repair    . Icd generator change  01/2008    medtronic, hx+ EP study     Current Outpatient Prescriptions  Medication Sig Dispense Refill  . acetaminophen (TYLENOL) 650 MG CR tablet Take 650 mg by mouth 2 (two) times daily.     Marland Kitchen allopurinol (ZYLOPRIM) 300 MG tablet Take 150 mg by mouth daily as needed (Gout).     Marland Kitchen aspirin 81 MG chewable tablet Chew 1 tablet (81 mg total) by mouth on Monday, Wednesday, & Friday. 12 tablet 11  . carvedilol (COREG) 6.25 MG tablet Take 1 tablet (6.25 mg total) by mouth 2 (two) times daily with a meal. Take 1  tablet by mouth twice a day    . cetirizine (ZYRTEC) 10 MG tablet Take 10 mg by mouth daily.      . Cholecalciferol (VITAMIN D) 2000 UNITS CAPS Take 1 capsule by mouth daily.    Marland Kitchen dofetilide (TIKOSYN) 500 MCG capsule Take 1 capsule (500 mcg total) by mouth every 12 (twelve) hours. 180 capsule 0  . esomeprazole (NEXIUM) 40 MG capsule Take 1 capsule (40 mg total) by mouth daily. 30 capsule 11  . glycopyrrolate (ROBINUL) 2 MG tablet Take 2 mg by mouth 2 (two) times daily as needed (stomach).    . isosorbide mononitrate (IMDUR) 30 MG 24 hr tablet Take 15 mg by mouth  daily.    . lisinopril (PRINIVIL,ZESTRIL) 20 MG tablet Take 1 tablet (20 mg total) by mouth at bedtime. 90 tablet 3  . magnesium oxide (MAG-OX) 400 MG tablet Take 400 mg by mouth daily.    . mometasone (ASMANEX) 220 MCG/INH inhaler Inhale 1 puff into the lungs daily.     . mometasone (NASONEX) 50 MCG/ACT nasal spray Place 2 sprays into the nose daily as needed (allergies).     . montelukast (SINGULAIR) 10 MG tablet TAKE ONE TABLET BY MOUTH AT BEDTIME 90 tablet 1  . nitroGLYCERIN (NITROSTAT) 0.4 MG SL tablet Place 1 tablet (0.4 mg total) under the tongue every 5 (five) minutes as needed for chest pain. 25 tablet 1  . ranolazine (RANEXA) 500 MG 12 hr tablet Take 1 tablet (500 mg total) by mouth 2 (two) times daily. 60 tablet 3  . silodosin (RAPAFLO) 4 MG CAPS capsule Take 8 mg by mouth.  Every 2 days    . simvastatin (ZOCOR) 40 MG tablet TAKE ONE TABLET BY MOUTH AT BEDTIME 90 tablet 3  . spironolactone (ALDACTONE) 25 MG tablet Take 0.5 tablets (12.5 mg total) by mouth daily. 30 tablet 11  . warfarin (COUMADIN) 5 MG tablet Take 1 tablet by mouth daily or as directed by coumadin clinic 90 tablet 0   No current facility-administered medications for this visit.    Allergies:   Amoxicillin and Penicillins   Social History:  The patient  reports that he has never smoked. He has never used smokeless tobacco. He reports that he drinks about 1.2 oz of alcohol per week. He reports that he does not use illicit drugs.   Family History:  The patient's family history includes Healthy in his brother and sister; Heart disease in his father; Heart failure in his sister; Parkinsonism in his brother; Stroke in his mother. There is no history of Colon cancer.    ROS:  Please see the history of present illness.   All other systems are reviewed and negative.    PHYSICAL EXAM: VS:  BP 116/62 mmHg  Pulse 53  Ht 5\' 7"  (1.702 m)  Wt 152 lb 9.6 oz (69.219 kg)  BMI 23.89 kg/m2 , BMI Body mass index is 23.89 kg/(m^2). GEN: Well nourished, well developed, in no acute distress HEENT: normal Neck: no JVD, carotid bruits, or masses Cardiac: RRR; no murmurs, rubs, or gallops,no edema  Respiratory:  clear to auscultation bilaterally, normal work of breathing GI: soft, nontender, nondistended, + BS MS: no deformity or atrophy Skin: warm and dry, device pocket is well healed Neuro:  Strength and sensation are intact Psych: euthymic mood, full affect  EKG:  EKG is ordered today. The ekg ordered today shows sinus rhythm 52 bpm, IVCD (QRS 144 msec), lateral infarction, Qtc 512 msec  Device interrogation is reviewed today in detail.  See PaceArt for details.   Recent Labs: 05/31/2014: B Natriuretic Peptide 648.3* 06/01/2014: Hemoglobin 14.5; Platelets 232 08/31/2014: ALT 15; BUN 9; Creatinine, Ser  0.81; Magnesium 2.1; Potassium 3.9; Sodium 137    Lipid Panel     Component Value Date/Time   CHOL 141 08/31/2014 0822   TRIG 132.0 08/31/2014 0822   HDL 32.90* 08/31/2014 0822   CHOLHDL 4 08/31/2014 0822   VLDL 26.4 08/31/2014 0822   LDLCALC 82 08/31/2014 0822     Wt Readings from Last 3 Encounters:  12/06/14 152 lb 9.6 oz (69.219 kg)  11/24/14 150 lb 12.8 oz (68.402 kg)  08/26/14 155 lb (70.308 kg)  Other studies Reviewed: Additional studies/ records that were reviewed today include: Dr Aquilla Hacker notes, AF clinic notes, echo  Review of the above records today demonstrates: EF 20%, moderate MR, LA 98mm   ASSESSMENT AND PLAN:  1.  paroyxsmal atrial fibrillation The patient has symptomatic atrial fibrillation refractory to tikosyn.  Therapeutic strategies for afib including medicine and ablation were discussed in detail with the patient today. Risk, benefits, and alternatives to EP study and radiofrequency ablation for afib were also discussed in detail today.  He would prefer to avoid ablation if possible.  Today, we talked about the GENETIC AF study.  He is quite interested in this option.  He is aware that this would require that he come off of tikosyn.  He is willing to do so.   If he is not a candidate or fails medical therapy with genetic AF study drug then he may be more willing to consider ablation.  He is aware that with his structural heart disease that anticipated success rates with ablation are reduced.  2. Ischemic CM No ischemic symptoms euvolemic today Normal ICD function See Pace Art report No changes today  3. CAD No ischemic symptoms  Today, I have spent 40 minutes with the patient discussing afib managment.  More than 50% of the visit time today was spent on this issue.    Follow-up: return to see me in 6 weeks  Current medicines are reviewed at length with the patient today.   The patient does not have concerns regarding his medicines.  The  following changes were made today:  none  Labs/ tests ordered today include:  Orders Placed This Encounter  Procedures  . Implantable device check  . EKG 12-Lead     Signed, Thompson Grayer, MD     [address] [place] [address] [phone] (office) [phone] (fax)

## 2014-12-06 NOTE — Patient Instructions (Signed)
Medication Instructions:  Your physician recommends that you continue on your current medications as directed. Please refer to the Current Medication list given to you today.   Labwork: None ordered   Testing/Procedures: Needs to be screened for Genetic Afib   Follow-Up: Your physician recommends that you schedule a follow-up appointment in: 6 weeks with Dr Rayann Heman   Any Other Special Instructions Will Be Listed Below (If Applicable).     If you need a refill on your cardiac medications before your next appointment, please call your pharmacy.

## 2014-12-07 ENCOUNTER — Telehealth: Payer: Self-pay

## 2014-12-07 ENCOUNTER — Encounter: Payer: Self-pay | Admitting: Internal Medicine

## 2014-12-07 NOTE — Telephone Encounter (Signed)
Tikosyn 500 mcg re-ordered through [employer]. Order # [id number], although it is not due to be shipped till 12/21/2014.

## 2014-12-08 ENCOUNTER — Ambulatory Visit: Payer: Medicare Other | Admitting: Pharmacist Clinician (PhC)/ Clinical Pharmacy Specialist

## 2014-12-08 DIAGNOSIS — Z23 Encounter for immunization: Secondary | ICD-10-CM | POA: Diagnosis not present

## 2014-12-08 DIAGNOSIS — I10 Essential (primary) hypertension: Secondary | ICD-10-CM | POA: Diagnosis not present

## 2014-12-08 DIAGNOSIS — N400 Enlarged prostate without lower urinary tract symptoms: Secondary | ICD-10-CM | POA: Diagnosis not present

## 2014-12-08 DIAGNOSIS — I480 Paroxysmal atrial fibrillation: Secondary | ICD-10-CM | POA: Diagnosis not present

## 2014-12-08 DIAGNOSIS — F419 Anxiety disorder, unspecified: Secondary | ICD-10-CM | POA: Diagnosis not present

## 2014-12-08 DIAGNOSIS — E7800 Pure hypercholesterolemia, unspecified: Secondary | ICD-10-CM | POA: Diagnosis not present

## 2014-12-08 DIAGNOSIS — J45909 Unspecified asthma, uncomplicated: Secondary | ICD-10-CM | POA: Diagnosis not present

## 2014-12-09 ENCOUNTER — Ambulatory Visit (INDEPENDENT_AMBULATORY_CARE_PROVIDER_SITE_OTHER): Payer: Medicare Other | Admitting: Pharmacist Clinician (PhC)/ Clinical Pharmacy Specialist

## 2014-12-09 ENCOUNTER — Encounter: Payer: Self-pay | Admitting: *Deleted

## 2014-12-09 DIAGNOSIS — Z006 Encounter for examination for normal comparison and control in clinical research program: Secondary | ICD-10-CM

## 2014-12-09 DIAGNOSIS — Z7901 Long term (current) use of anticoagulants: Secondary | ICD-10-CM | POA: Diagnosis not present

## 2014-12-09 DIAGNOSIS — I4891 Unspecified atrial fibrillation: Secondary | ICD-10-CM | POA: Diagnosis not present

## 2014-12-09 DIAGNOSIS — I480 Paroxysmal atrial fibrillation: Secondary | ICD-10-CM | POA: Diagnosis not present

## 2014-12-09 LAB — POCT INR: INR: 3.4

## 2014-12-09 NOTE — Progress Notes (Addendum)
Mr. [name] came in for a GENETIC-AF screening visit. The study was discussed with [name] to include risks/benefits. He was given the consent to read and had an opportunity to ask questions. He agreed to participate in the trial. The consent was signed and he was given a copy. No study related procedures were performed prior to the signing of the consent.  Patient Genotype results discussed with patient, Dr. Rayann Heman, Dr. [name] and [name]. Patient does possess the ARG varient. [name] would like to wait until after the holidays to discuss Randomization into the GENETIC-AF trial.

## 2014-12-13 ENCOUNTER — Institutional Professional Consult (permissible substitution): Payer: Medicare Other | Admitting: Internal Medicine

## 2014-12-13 ENCOUNTER — Telehealth: Payer: Self-pay | Admitting: Internal Medicine

## 2014-12-13 NOTE — Telephone Encounter (Signed)
New Message    Pt calling to speak to [facility] and see if she has ordered some medications for him. Please call back and advise.

## 2014-12-14 DIAGNOSIS — N401 Enlarged prostate with lower urinary tract symptoms: Secondary | ICD-10-CM | POA: Diagnosis not present

## 2014-12-14 DIAGNOSIS — R972 Elevated prostate specific antigen [PSA]: Secondary | ICD-10-CM | POA: Diagnosis not present

## 2014-12-16 NOTE — Telephone Encounter (Signed)
Patient informed. Order for Tikosyn was placed last week.

## 2014-12-20 DIAGNOSIS — N138 Other obstructive and reflux uropathy: Secondary | ICD-10-CM | POA: Diagnosis not present

## 2014-12-20 DIAGNOSIS — R972 Elevated prostate specific antigen [PSA]: Secondary | ICD-10-CM | POA: Diagnosis not present

## 2014-12-20 DIAGNOSIS — N401 Enlarged prostate with lower urinary tract symptoms: Secondary | ICD-10-CM | POA: Diagnosis not present

## 2014-12-22 ENCOUNTER — Ambulatory Visit (INDEPENDENT_AMBULATORY_CARE_PROVIDER_SITE_OTHER): Payer: Medicare Other | Admitting: Pharmacist Clinician (PhC)/ Clinical Pharmacy Specialist

## 2014-12-22 DIAGNOSIS — I4891 Unspecified atrial fibrillation: Secondary | ICD-10-CM

## 2014-12-22 DIAGNOSIS — I480 Paroxysmal atrial fibrillation: Secondary | ICD-10-CM | POA: Diagnosis not present

## 2014-12-22 DIAGNOSIS — Z7901 Long term (current) use of anticoagulants: Secondary | ICD-10-CM | POA: Diagnosis not present

## 2014-12-22 LAB — POCT INR: INR: 2.9

## 2014-12-27 ENCOUNTER — Telehealth: Payer: Self-pay

## 2014-12-27 ENCOUNTER — Encounter: Payer: Self-pay | Admitting: *Deleted

## 2014-12-27 DIAGNOSIS — Z006 Encounter for examination for normal comparison and control in clinical research program: Secondary | ICD-10-CM

## 2014-12-27 NOTE — Telephone Encounter (Signed)
Tikosyn 500 mcg 90 day supply arrived from [facility]. Patient informed.

## 2014-12-27 NOTE — Progress Notes (Signed)
Mr. [name] came in today to discuss participation in the GENETIC-AF trial. After lengthy discussion he has decided to proceed with randomization. Mr. [name] was instructed to stop taking Tikosyn staring 11/29 per study requirements. He was instructed to stop taking Coreg 12/6 at which time he will be randomized to Toprol XL vs. Bucindolol.

## 2014-12-29 ENCOUNTER — Encounter: Payer: Self-pay | Admitting: *Deleted

## 2014-12-29 DIAGNOSIS — Z006 Encounter for examination for normal comparison and control in clinical research program: Secondary | ICD-10-CM

## 2014-12-29 NOTE — Progress Notes (Signed)
[name] requested to come in to the clinic today because he was concerned he may be in afib and wanted to discuss the trial with Dr. [name].Dr. [name] explained the trial to him and what steps would occur if [name] continues to be in afib after randomization next week. EKG did show afib. Dr. [name] instructed [name] to call the cardiology office if he develops symptoms that are concerning. [name] and his wife verbalized understanding.

## 2014-12-31 DIAGNOSIS — J301 Allergic rhinitis due to pollen: Secondary | ICD-10-CM | POA: Diagnosis not present

## 2014-12-31 DIAGNOSIS — J4540 Moderate persistent asthma, uncomplicated: Secondary | ICD-10-CM | POA: Diagnosis not present

## 2014-12-31 DIAGNOSIS — J3089 Other allergic rhinitis: Secondary | ICD-10-CM | POA: Diagnosis not present

## 2014-12-31 DIAGNOSIS — J3081 Allergic rhinitis due to animal (cat) (dog) hair and dander: Secondary | ICD-10-CM | POA: Diagnosis not present

## 2015-01-05 ENCOUNTER — Other Ambulatory Visit: Payer: Self-pay | Admitting: Cardiovascular Disease

## 2015-01-05 ENCOUNTER — Telehealth: Payer: Self-pay | Admitting: Internal Medicine

## 2015-01-05 ENCOUNTER — Encounter: Payer: Self-pay | Admitting: *Deleted

## 2015-01-05 ENCOUNTER — Telehealth: Payer: Self-pay | Admitting: *Deleted

## 2015-01-05 ENCOUNTER — Other Ambulatory Visit: Payer: Self-pay | Admitting: *Deleted

## 2015-01-05 DIAGNOSIS — Z006 Encounter for examination for normal comparison and control in clinical research program: Secondary | ICD-10-CM

## 2015-01-05 MED ORDER — AMBULATORY NON FORMULARY MEDICATION: 1.0000 | Freq: Two times a day (BID) | Status: DC

## 2015-01-05 NOTE — Progress Notes (Signed)
[name] was in for his randomization visit for the GENETIC-AF trial. He took his last dose of Coreg last night. He states he has been taking an additional 6.50m daily for the past week in addition to 6.267mBID. He meet all the inclusion exclusion criteria to proceed with randomization. He was randomized to kit # [id number] first dose was given here in the clinic at 10:43am. His current dosing is metoprolol XL 100 mg QD versus Bucindolol 2534mID. He will return next week for study drug titration.

## 2015-01-05 NOTE — Telephone Encounter (Signed)
Returned call and advised that I had put one month sample for him at the front desk to pick up.

## 2015-01-05 NOTE — Telephone Encounter (Signed)
New Prob   Patient calling the office for samples of medication:   1.  What medication and dosage are you requesting samples for? Nexium 500 mg  2.  Are you currently out of this medication? No, pt has about 3-4 left

## 2015-01-05 NOTE — Telephone Encounter (Signed)
Patient calling the office for samples of medication:   1.  What medication and dosage are you requesting samples for?Ranexa  2.  Are you currently out of this medication? would like to have enough for a month

## 2015-01-05 NOTE — Telephone Encounter (Signed)
Called patient to let him know that we did not have Nexium 500 mg tablets available.

## 2015-01-06 ENCOUNTER — Telehealth: Payer: Self-pay | Admitting: Gastroenterology

## 2015-01-06 DIAGNOSIS — H43811 Vitreous degeneration, right eye: Secondary | ICD-10-CM | POA: Diagnosis not present

## 2015-01-06 DIAGNOSIS — H469 Unspecified optic neuritis: Secondary | ICD-10-CM | POA: Diagnosis not present

## 2015-01-06 DIAGNOSIS — H353132 Nonexudative age-related macular degeneration, bilateral, intermediate dry stage: Secondary | ICD-10-CM | POA: Diagnosis not present

## 2015-01-06 DIAGNOSIS — H353190 Nonexudative age-related macular degeneration, unspecified eye, stage unspecified: Secondary | ICD-10-CM | POA: Diagnosis not present

## 2015-01-06 DIAGNOSIS — Z961 Presence of intraocular lens: Secondary | ICD-10-CM | POA: Diagnosis not present

## 2015-01-06 NOTE — Telephone Encounter (Signed)
Discussed the medications, it's indications and side effects. The patient has an appointment with Dr Havery Moros in February.

## 2015-01-11 ENCOUNTER — Ambulatory Visit (INDEPENDENT_AMBULATORY_CARE_PROVIDER_SITE_OTHER): Payer: Medicare Other

## 2015-01-11 DIAGNOSIS — Z95810 Presence of automatic (implantable) cardiac defibrillator: Secondary | ICD-10-CM

## 2015-01-11 DIAGNOSIS — I5022 Chronic systolic (congestive) heart failure: Secondary | ICD-10-CM

## 2015-01-12 ENCOUNTER — Ambulatory Visit (INDEPENDENT_AMBULATORY_CARE_PROVIDER_SITE_OTHER): Payer: Medicare Other | Admitting: Pharmacist Clinician (PhC)/ Clinical Pharmacy Specialist

## 2015-01-12 DIAGNOSIS — I4891 Unspecified atrial fibrillation: Secondary | ICD-10-CM

## 2015-01-12 DIAGNOSIS — I480 Paroxysmal atrial fibrillation: Secondary | ICD-10-CM | POA: Diagnosis not present

## 2015-01-12 DIAGNOSIS — Z7901 Long term (current) use of anticoagulants: Secondary | ICD-10-CM

## 2015-01-12 LAB — POCT INR: INR: 4.2

## 2015-01-12 NOTE — Progress Notes (Addendum)
EPIC Encounter for ICM Monitoring  Patient Name: [name] is a 74 y.o. male Date: 01/12/2015 Primary Care Physican: [name], MD Primary Cardiologist: [name] Electrophysiologist: Allred Dry Weight: 150 lb       In the past month, have you:  1. Gained more than 2 pounds in a day or more than 5 pounds in a week? no  2. Had changes in your medications (with verification of current medications)? no  3. Had more shortness of breath than is usual for you? no  4. Limited your activity because of shortness of breath? no  5. Not been able to sleep because of shortness of breath? no  6. Had increased swelling in your feet or ankles? no  7. Had symptoms of dehydration (dizziness, dry mouth, increased thirst, decreased urine output) no  8. Had changes in sodium restriction? no  9. Been compliant with medication? Yes    ICM trend: 01/11/2015   Follow-up plan: ICM clinic phone appointment on 02/17/2015.  He has appointment with Dr Rayann Heman on 01/17/2015.  Optivol thoracic impedance trending below baseline since ~12/07/2014 to 01/11/2015 suggesting fluid retention.  He stated he has had a little shortness of breath but thinks it is better in the last few days.  Asked if he follows a low sodium diet and he stated he thinks so but has not checked labels for sodium amounts.  Recommended he check labels and check if the sodium intake is < 2000 mg daily.  Low Sodium Eating Plan and Heart Failure educational material mailed to patient.     Reviewed transmission with Dr Rayann Heman.  He will review fluid levels with patient on Monday's appointment, 01/17/2015.  Copy of note sent to patient's primary care physician, primary cardiologist, and device following physician.  [name], RN, CCM 01/12/2015 2:47 PM  Thompson Grayer, MD   Sent: Thu January 13, 2015 4:25 PM    To: [name], RN        Message     Should we increase lasix x 3 days and then reassess when I see him?             ----- Message -----     From: [name], RN     Sent: 01/12/2015  3:00 PM      To: Thompson Grayer, MD        Reviewed transmission with Dr Rayann Heman and Lasix has not been prescribed.  Per Dr Jackalyn Lombard orders Lasix 40 mg 1 tablet daily and patient will be evaluated on 01/17/2015.    Patient notified Dr Rayann Heman ordered Furosemide (Lasix) 40 mg 1 tablet daily.  Furosemide Side effects reviewed with patient. Advised Dr Rayann Heman will evaluate on 01/17/2015, Monday, effectiveness of Furosemide.

## 2015-01-13 ENCOUNTER — Other Ambulatory Visit: Payer: Self-pay | Admitting: *Deleted

## 2015-01-13 ENCOUNTER — Encounter: Payer: Self-pay | Admitting: *Deleted

## 2015-01-13 DIAGNOSIS — Z006 Encounter for examination for normal comparison and control in clinical research program: Secondary | ICD-10-CM

## 2015-01-13 MED ORDER — AMBULATORY NON FORMULARY MEDICATION: 1.0000 | Freq: Two times a day (BID) | Status: DC

## 2015-01-13 NOTE — Progress Notes (Addendum)
Saw Mr. Moder for his up titration visit for the GENETIC-AF trial. Mr. Fiorella has been increased to Metroprolol XL 239m QD versus Bucindolol 50 mg BID. Mr. GHamreturned drug kit # [id number] 1 tablet remaining. Drug kit #[id number] [id number] dispensed to the patient.

## 2015-01-14 MED ORDER — FUROSEMIDE 40 MG PO TABS: 40.0000 mg | ORAL_TABLET | Freq: Every day | ORAL | Status: DC

## 2015-01-14 NOTE — Addendum Note (Signed)
Addended by: [name] on: 01/14/2015 09:23 AM   Modules accepted: Orders

## 2015-01-17 ENCOUNTER — Ambulatory Visit (INDEPENDENT_AMBULATORY_CARE_PROVIDER_SITE_OTHER): Payer: Medicare Other | Admitting: Internal Medicine

## 2015-01-17 ENCOUNTER — Encounter: Payer: Self-pay | Admitting: Internal Medicine

## 2015-01-17 VITALS — BP 90/68 | HR 63 | Ht 67.0 in | Wt 152.0 lb

## 2015-01-17 DIAGNOSIS — I5022 Chronic systolic (congestive) heart failure: Secondary | ICD-10-CM

## 2015-01-17 DIAGNOSIS — R0602 Shortness of breath: Secondary | ICD-10-CM

## 2015-01-17 DIAGNOSIS — I2589 Other forms of chronic ischemic heart disease: Secondary | ICD-10-CM | POA: Diagnosis not present

## 2015-01-17 DIAGNOSIS — I472 Ventricular tachycardia: Secondary | ICD-10-CM | POA: Diagnosis not present

## 2015-01-17 DIAGNOSIS — I480 Paroxysmal atrial fibrillation: Secondary | ICD-10-CM | POA: Diagnosis not present

## 2015-01-17 DIAGNOSIS — I255 Ischemic cardiomyopathy: Secondary | ICD-10-CM

## 2015-01-17 DIAGNOSIS — I4729 Other ventricular tachycardia: Secondary | ICD-10-CM

## 2015-01-17 LAB — CUP PACEART INCLINIC DEVICE CHECK
Brady Statistic RV Percent Paced: 0.05 %
Date Time Interrogation Session: 20161219164729
HIGH POWER IMPEDANCE MEASURED VALUE: 54 Ohm
HighPow Impedance: 80 Ohm
Implantable Lead Implant Date: 20010806
Implantable Lead Location: 753860
Implantable Lead Model: 147
Implantable Lead Serial Number: 104581
Lead Channel Pacing Threshold Amplitude: 1 V
Lead Channel Setting Pacing Amplitude: 2.5 V
Lead Channel Setting Pacing Pulse Width: 0.7 ms
Lead Channel Setting Sensing Sensitivity: 0.3 mV
MDC IDC MSMT BATTERY VOLTAGE: 2.86 V
MDC IDC MSMT LEADCHNL RV IMPEDANCE VALUE: 1136 Ohm
MDC IDC MSMT LEADCHNL RV PACING THRESHOLD PULSEWIDTH: 0.7 ms
MDC IDC MSMT LEADCHNL RV SENSING INTR AMPL: 17 mV

## 2015-01-17 LAB — BASIC METABOLIC PANEL
BUN: 13 mg/dL (ref 7–25)
CALCIUM: 9.2 mg/dL (ref 8.6–10.3)
CO2: 26 mmol/L (ref 20–31)
CREATININE: 0.91 mg/dL (ref 0.70–1.18)
Chloride: 99 mmol/L (ref 98–110)
GLUCOSE: 60 mg/dL — AB (ref 65–99)
Potassium: 4.3 mmol/L (ref 3.5–5.3)
Sodium: 134 mmol/L — ABNORMAL LOW (ref 135–146)

## 2015-01-17 NOTE — Progress Notes (Signed)
Electrophysiology Office Note   Date:  01/17/2015   ID:  Jason, [name] [birth year], MRN [id number]  PCP:  Jason Frees, MD  Primary Electrophysiologist: Dr [name]  Chief Complaint  Patient presents with  . AFIB w/RVR  . ICM  . Chronic systolic heart failure     History of Present Illness: [name] is a 74 y.o. male who presents today for electrophysiology follow-up.  He reports significant improvement in AF burden with Genetic AF study drug.  Optivol was elevated last week and he took 3 days of Lasix with good diuresis and improvement in thoracic impedence. He has previously failed Tikosyn and Ranexa.   Today, he denies symptoms of palpitations, chest pain, shortness of breath, orthopnea, PND, lower extremity edema, claudication, dizziness, presyncope, syncope, bleeding, or neurologic sequela. The patient is tolerating medications without difficulties and is otherwise without complaint today.    Past Medical History  Diagnosis Date  . Coronary artery disease     Hx MI 1992, CABG 1998 , Nuc study 11.2013 large scar but no ischemia  . 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 Paroxysmal atrial fibrillation (HCC)   . GERD (gastroesophageal reflux disease)   . H. pylori infection     Hx of   . Cardiomyopathy, ischemic 2011    with EF 25-35% by echo  . H/O myocardial infarction, greater than 8 weeks 1992    large ant wall injury  . NSVT (nonsustained ventricular tachycardia) [facility])    Past Surgical History  Procedure Laterality Date  . Nasal sinus surgery  05/31/2010    Dr. Benjamine [name]  . Coronary artery bypass graft  1998    x 5  . Cataract extraction, bilateral    . Knee surgery      right  . Hernia repair    . Icd generator change  01/2008    medtronic, hx+ EP study     Current Outpatient Prescriptions  Medication Sig Dispense Refill  . acetaminophen (TYLENOL) 650 MG CR tablet Take 650 mg by mouth 2 (two) times  daily.     Marland Kitchen allopurinol (ZYLOPRIM) 300 MG tablet Take 150 mg by mouth daily as needed (Gout).     . AMBULATORY NON FORMULARY MEDICATION Take 1 tablet by mouth 2 (two) times daily. Medication Name: GENETIC-AF study drug Metoprolol XL 200 mg QD vs Bucindolol 50 mg BID    . aspirin 81 MG chewable tablet Chew 1 tablet (81 mg total) by mouth on Monday, Wednesday, & Friday. 12 tablet 11  . cetirizine (ZYRTEC) 10 MG tablet Take 10 mg by mouth daily.      . Cholecalciferol (VITAMIN D) 2000 UNITS CAPS Take 1 capsule by mouth daily.    Marland Kitchen esomeprazole (NEXIUM) 40 MG capsule Take 1 capsule (40 mg total) by mouth daily. 30 capsule 11  . furosemide (LASIX) 40 MG tablet Take 1 tablet (40 mg total) by mouth daily. 30 tablet 0  . glycopyrrolate (ROBINUL) 2 MG tablet Take 2 mg by mouth 2 (two) times daily as needed (stomach).    . isosorbide mononitrate (IMDUR) 30 MG 24 hr tablet Take 15 mg by mouth daily.    Marland Kitchen lisinopril (PRINIVIL,ZESTRIL) 20 MG tablet Take 1 tablet (20 mg total) by mouth at bedtime. 90 tablet 3  . magnesium oxide (MAG-OX) 400 MG tablet Take 400 mg by mouth daily.    . mometasone (ASMANEX) 220 MCG/INH inhaler Inhale 1 puff into the  lungs daily.     . mometasone (NASONEX) 50 MCG/ACT nasal spray Place 2 sprays into the nose daily as needed (allergies).     . montelukast (SINGULAIR) 10 MG tablet TAKE ONE TABLET BY MOUTH AT BEDTIME 90 tablet 1  . nitroGLYCERIN (NITROSTAT) 0.4 MG SL tablet Place 1 tablet (0.4 mg total) under the tongue every 5 (five) minutes as needed for chest pain. 25 tablet 1  . ranolazine (RANEXA) 500 MG 12 hr tablet Take 1 tablet (500 mg total) by mouth 2 (two) times daily. 60 tablet 3  . silodosin (RAPAFLO) 4 MG CAPS capsule Take 8 mg by mouth. Every 2 days    . simvastatin (ZOCOR) 40 MG tablet TAKE ONE TABLET BY MOUTH AT BEDTIME 90 tablet 3  . spironolactone (ALDACTONE) 25 MG tablet Take 0.5 tablets (12.5 mg total) by mouth daily. 30 tablet 11  . warfarin (COUMADIN) 5 MG  tablet Take 1 tablet by mouth daily or as directed by coumadin clinic 90 tablet 0   No current facility-administered medications for this visit.    Allergies:   Amoxicillin and Penicillins   Social History:  The patient  reports that he has never smoked. He has never used smokeless tobacco. He reports that he drinks about 1.2 oz of alcohol per week. He reports that he does not use illicit drugs.   Family History:  The patient's family history includes Healthy in his brother and sister; Heart disease in his father; Heart failure in his sister; Parkinsonism in his brother; Stroke in his mother. There is no history of Colon cancer.    ROS:  Please see the history of present illness.   All other systems are reviewed and negative.    PHYSICAL EXAM: VS:  BP 90/68 mmHg  Pulse 63  Ht 5\' 7"  (1.702 m)  Wt 152 lb (68.947 kg)  BMI 23.80 kg/m2 , BMI Body mass index is 23.8 kg/(m^2). GEN: Well nourished, well developed, in no acute distress HEENT: normal Neck: no JVD, carotid bruits, or masses Cardiac: RRR; no murmurs, rubs, or gallops,no edema  Respiratory:  clear to auscultation bilaterally, normal work of breathing GI: soft, nontender, nondistended, + BS MS: no deformity or atrophy Skin: warm and dry, device pocket is well healed Neuro:  Strength and sensation are intact Psych: euthymic mood, full affect  EKG:  EKG is not ordered today.  Device interrogation is reviewed today in detail.  See PaceArt for details.   Recent Labs: 05/31/2014: B Natriuretic Peptide 648.3* 06/01/2014: Hemoglobin 14.5; Platelets 232 08/31/2014: ALT 15; BUN 9; Creatinine, Ser 0.81; Magnesium 2.1; Potassium 3.9; Sodium 137    Lipid Panel     Component Value Date/Time   CHOL 141 08/31/2014 0822   TRIG 132.0 08/31/2014 0822   HDL 32.90* 08/31/2014 0822   CHOLHDL 4 08/31/2014 0822   VLDL 26.4 08/31/2014 0822   LDLCALC 82 08/31/2014 0822     Wt Readings from Last 3 Encounters:  01/17/15 152 lb (68.947 kg)    01/05/15 150 lb 14.4 oz (68.448 kg)  12/09/14 152 lb 1.9 oz (69 kg)     ASSESSMENT AND PLAN:  1. Paroyxsmal atrial fibrillation The patient has symptomatic atrial fibrillation refractory to tikosyn. He is improved on Genetic AF study drug.  Will continue for now. He would prefer to avoid procedures if possible.  Continue Warfarin for CHADS2VASC of 4 Discontinue Ranexa  2. Ischemic CM/CAD No ischemic symptoms euvolemic today after 3 days of Lasix  Normal ICD function  See Pace Art report Decrease lasix to 20mg  daily BMET, BNP today   Follow-up: return to AF clinic in 3 months, Dr [name] in 6 months, research as scheduled.  Current medicines are reviewed at length with the patient today.   The patient does not have concerns regarding his medicines.  The following changes were made today:  none  Labs/ tests ordered today include: BMET, BNP   Signed, Thompson Grayer, MD 01/17/2015 11:36 AM  [facility] [address] [place] [address] [phone] (office) [phone] (fax)

## 2015-01-17 NOTE — Patient Instructions (Addendum)
Medication Instructions:  Your physician has recommended you make the following change in your medication:  1) Stop Renexa  2) Decrease Lasix to 1/2 tablet (20 mg) daily    Labwork: Your physician recommends that you return for lab work: BMP/BNP   Testing/Procedures: None ordered   Follow-Up: Your physician recommends that you schedule a follow-up appointment in: 3 months with Roderic Palau, NP and 6 months with Dr [name]   Any Other Special Instructions Will Be Listed Below (If Applicable).     If you need a refill on your cardiac medications before your next appointment, please call your pharmacy.

## 2015-01-17 NOTE — Progress Notes (Signed)
After office visit with Dr Rayann Heman on 01/17/2015, follow up ICM transmission scheduled for 01/26/2015 ti recheck fluid levels.  Patient agreed.

## 2015-01-18 LAB — BRAIN NATRIURETIC PEPTIDE: BRAIN NATRIURETIC PEPTIDE: 191.3 pg/mL — AB (ref 0.0–100.0)

## 2015-01-20 ENCOUNTER — Encounter: Payer: Self-pay | Admitting: Internal Medicine

## 2015-01-26 ENCOUNTER — Telehealth: Payer: Self-pay

## 2015-01-26 ENCOUNTER — Encounter: Payer: Self-pay | Admitting: *Deleted

## 2015-01-26 ENCOUNTER — Ambulatory Visit (INDEPENDENT_AMBULATORY_CARE_PROVIDER_SITE_OTHER): Payer: Medicare Other

## 2015-01-26 DIAGNOSIS — Z006 Encounter for examination for normal comparison and control in clinical research program: Secondary | ICD-10-CM

## 2015-01-26 DIAGNOSIS — I5022 Chronic systolic (congestive) heart failure: Secondary | ICD-10-CM

## 2015-01-26 NOTE — Progress Notes (Unsigned)
Mr. Devoss was in for his GENETIC-AF follow-up visit. He returned his investigational drug. He was 100% compliant. He returned Kit# [id number] and [id number]. He was in sinus rhythm for EKG at 10:18 and 11:24.Two new kits were dispensed today-kit# [id number] and [id number]. Mr. Ballard will return on 02/09/2015 for his 2 week follow-up.  

## 2015-01-26 NOTE — Telephone Encounter (Signed)
ICM transmission received.  Attempted call to patient. Left message for return call.  

## 2015-01-27 MED ORDER — FUROSEMIDE 40 MG PO TABS: 40.0000 mg | ORAL_TABLET | Freq: Every day | ORAL | Status: DC

## 2015-01-27 NOTE — Telephone Encounter (Signed)
Spoke with patient.

## 2015-01-27 NOTE — Progress Notes (Signed)
EPIC Encounter for ICM Monitoring  Patient Name: [name] is a 74 y.o. male Date: 01/27/2015 Primary Care Physican: [name], MD Primary Cardiologist: [name] Electrophysiologist: Allred Dry Weight: 150 lb       In the past month, have you:  1. Gained more than 2 pounds in a day or more than 5 pounds in a week? no  2. Had changes in your medications (with verification of current medications)? no  3. Had more shortness of breath than is usual for you? no  4. Limited your activity because of shortness of breath? no  5. Not been able to sleep because of shortness of breath? no  6. Had increased swelling in your feet or ankles? no  7. Had symptoms of dehydration (dizziness, dry mouth, increased thirst, decreased urine output) no  8. Had changes in sodium restriction? no  9. Been compliant with medication? Yes   ICM trend: 01/26/2015 - 1 year view   ICM Trend:  90 day view   Follow-up plan: ICM clinic phone appointment on 02/17/2015.  Optivol thoracic impedance below baseline since last office interrogation on 01/17/2015 suggesting fluid retention.  He denied any symptoms.  Patient was prescribed Furosemide 40mg  x 3 days prior to Dr Jackalyn Lombard 01/17/2015 office visit and impedance was at baseline at that time.   At office visit with Dr Rayann Heman, he was prescribed to take Furosemide 40 mg - 1/2 tablet daily which he confirms he has been taking and impedance is below baseline.  He stated he has had Afib this morning and hoping he converts back to NSR.   Lab results from 01/17/2015: Creatinine .91, Potassium 4.3 and BNP 191.3.    Advised will review transmission with Dr Rayann Heman in the office today and call him back regarding if he should continue Furosemide 20 mg daily.      Dr Rayann Heman reviewed transmission ordered to take Furosemide 40 mg - 1 tablet daily.  Informed Dr Rayann Heman that patient reported he had Afib this morning.  Next transmission 02/17/2015.  Patient notified to  take Furosemide 40 mg - 1 tablet daily and next transmission 02/17/2015.    Copy of note sent to patient's primary care physician, primary cardiologist, and device following physician.  [name], RN, CCM 01/27/2015 11:14 AM

## 2015-01-28 ENCOUNTER — Ambulatory Visit (INDEPENDENT_AMBULATORY_CARE_PROVIDER_SITE_OTHER): Payer: Medicare Other | Admitting: Pharmacist Clinician (PhC)/ Clinical Pharmacy Specialist

## 2015-01-28 DIAGNOSIS — I4891 Unspecified atrial fibrillation: Secondary | ICD-10-CM | POA: Diagnosis not present

## 2015-01-28 DIAGNOSIS — Z7901 Long term (current) use of anticoagulants: Secondary | ICD-10-CM | POA: Diagnosis not present

## 2015-01-28 DIAGNOSIS — I480 Paroxysmal atrial fibrillation: Secondary | ICD-10-CM | POA: Diagnosis not present

## 2015-01-28 LAB — POCT INR: INR: 2.1

## 2015-02-01 ENCOUNTER — Other Ambulatory Visit: Payer: Self-pay | Admitting: Cardiovascular Disease

## 2015-02-01 NOTE — Telephone Encounter (Signed)
Rx request sent to pharmacy.  

## 2015-02-03 ENCOUNTER — Ambulatory Visit (HOSPITAL_COMMUNITY)
Admission: RE | Admit: 2015-02-03 | Discharge: 2015-02-03 | Disposition: A | Discharge status: Home / Self Care | Payer: Medicare Other | Source: Ambulatory Visit | Attending: Nurse Practitioner | Admitting: Nurse Practitioner

## 2015-02-03 ENCOUNTER — Telehealth: Payer: Self-pay | Admitting: *Deleted

## 2015-02-03 VITALS — BP 82/54 | HR 69 | Ht 67.0 in | Wt 149.6 lb

## 2015-02-03 DIAGNOSIS — I480 Paroxysmal atrial fibrillation: Secondary | ICD-10-CM | POA: Diagnosis not present

## 2015-02-03 DIAGNOSIS — I4819 Other persistent atrial fibrillation: Secondary | ICD-10-CM

## 2015-02-03 DIAGNOSIS — I959 Hypotension, unspecified: Secondary | ICD-10-CM | POA: Insufficient documentation

## 2015-02-03 DIAGNOSIS — I481 Persistent atrial fibrillation: Secondary | ICD-10-CM

## 2015-02-03 LAB — BASIC METABOLIC PANEL
Anion gap: 7 (ref 5–15)
BUN: 14 mg/dL (ref 6–20)
CALCIUM: 9.3 mg/dL (ref 8.9–10.3)
CHLORIDE: 101 mmol/L (ref 101–111)
CO2: 29 mmol/L (ref 22–32)
CREATININE: 1 mg/dL (ref 0.61–1.24)
GFR calc non Af Amer: >60 mL/min (ref >60)
GLUCOSE: 103 mg/dL — AB (ref 65–99)
Potassium: 4.8 mmol/L (ref 3.5–5.1)
Sodium: 137 mmol/L (ref 135–145)

## 2015-02-03 MED ORDER — DILTIAZEM HCL 30 MG PO TABS: ORAL_TABLET | ORAL | Status: DC

## 2015-02-03 NOTE — Patient Instructions (Addendum)
   Call if further issues

## 2015-02-03 NOTE — Telephone Encounter (Signed)
Mr. Fagin called [facility] this morning stating he has been in AFib since yesterday evening. He states he is feeling poorly and his heart rate has been going up to 160. Mr. Vidaurri was wondering about proceeding with a cardioversion if the AFib continues. I spoke with Dr. [name] and he recommended Mr. Starkovich come to the AFib clinic to be evaluated if there was an appointment available. I spoke with the clinic and Mr Underkoffler, he will be coming in at 1:30 today.

## 2015-02-04 NOTE — Progress Notes (Signed)
Patient ID: [name], male   DOB: 1941/01/20, 75 y.o.   MRN: [id number]     Primary Care Physician: [name], MD Referring Physician: Dr. Malissa Hippo CHRISTHOPHER CIMORELLI is a 75 y.o. male with a h/o PAF that is here for f/u of afib that started yesterday evening. In the clinic, today, he has returned to SR about 1-2 hours ago. He in the genetic AF trial, stopping tikosyn, and starting study drug. Tikosyn did not hold pt in SR. His optimal was increasing as well and he has had increase of lasix over the last few weeks. An ablation had been discussed with pt in the past but he was hesitant to proceed.   Today, he denies symptoms of palpitations, chest pain, shortness of breath, orthopnea, PND, lower extremity edema, dizziness, presyncope, syncope, or neurologic sequela. The patient is tolerating medications without difficulties and is otherwise without complaint today.   Past Medical History  Diagnosis Date  . Coronary artery disease     Hx MI 1992, CABG 1998 , Nuc study 11.2013 large scar but no ischemia  . 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 Paroxysmal atrial fibrillation (HCC)   . GERD (gastroesophageal reflux disease)   . H. pylori infection     Hx of   . Cardiomyopathy, ischemic 2011    with EF 25-35% by echo  . H/O myocardial infarction, greater than 8 weeks 1992    large ant wall injury  . NSVT (nonsustained ventricular tachycardia) [facility])    Past Surgical History  Procedure Laterality Date  . Nasal sinus surgery  05/31/2010    Dr. Suszanne Conners  . Coronary artery bypass graft  1998    x 5  . Cataract extraction, bilateral    . Knee surgery      right  . Hernia repair    . Icd generator change  01/2008    medtronic, hx+ EP study    Current Outpatient Prescriptions  Medication Sig Dispense Refill  . acetaminophen (TYLENOL) 650 MG CR tablet Take 650 mg by mouth 2 (two) times daily.     Marland Kitchen allopurinol (ZYLOPRIM) 300 MG tablet Take  150 mg by mouth daily as needed (Gout).     . AMBULATORY NON FORMULARY MEDICATION Take 1 tablet by mouth 2 (two) times daily. Medication Name: GENETIC-AF study drug Metoprolol XL 200 mg QD vs Bucindolol 50 mg BID    . aspirin 81 MG chewable tablet Chew 1 tablet (81 mg total) by mouth on Monday, Wednesday, & Friday. 12 tablet 11  . cetirizine (ZYRTEC) 10 MG tablet Take 10 mg by mouth daily.      . Cholecalciferol (VITAMIN D) 2000 UNITS CAPS Take 1 capsule by mouth daily.    Marland Kitchen esomeprazole (NEXIUM) 40 MG capsule Take 1 capsule (40 mg total) by mouth daily. 30 capsule 11  . furosemide (LASIX) 40 MG tablet Take 1 tablet (40 mg total) by mouth daily. 30 tablet 6  . glycopyrrolate (ROBINUL) 2 MG tablet Take 2 mg by mouth 2 (two) times daily as needed (stomach).    . isosorbide mononitrate (IMDUR) 30 MG 24 hr tablet Take 15 mg by mouth daily.    Marland Kitchen lisinopril (PRINIVIL,ZESTRIL) 20 MG tablet Take 1 tablet (20 mg total) by mouth at bedtime. 90 tablet 3  . magnesium oxide (MAG-OX) 400 MG tablet Take 400 mg by mouth daily.    . mometasone (ASMANEX) 220 MCG/INH inhaler Inhale 1  puff into the lungs daily.     . mometasone (NASONEX) 50 MCG/ACT nasal spray Place 2 sprays into the nose daily as needed (allergies).     . montelukast (SINGULAIR) 10 MG tablet Take 1 tablet (10 mg total) by mouth at bedtime. PLEASE SCHEDULE APPOINTMENT. 30 tablet 0  . silodosin (RAPAFLO) 4 MG CAPS capsule Take 8 mg by mouth. Every 2 days    . simvastatin (ZOCOR) 40 MG tablet TAKE ONE TABLET BY MOUTH AT BEDTIME 90 tablet 3  . spironolactone (ALDACTONE) 25 MG tablet Take 0.5 tablets (12.5 mg total) by mouth daily. 30 tablet 11  . warfarin (COUMADIN) 5 MG tablet Take 1 tablet by mouth daily or as directed by coumadin clinic 90 tablet 0  . nitroGLYCERIN (NITROSTAT) 0.4 MG SL tablet Place 1 tablet (0.4 mg total) under the tongue every 5 (five) minutes as needed for chest pain. (Patient not taking: Reported on 02/03/2015) 25 tablet 1   No  current facility-administered medications for this encounter.    Allergies  Allergen Reactions  . Amoxicillin Rash    unknown  . Penicillins Rash    unknown    Social History   Social History  . Marital Status: Married    Spouse Name: N/A  . Number of Children: 3  . Years of Education: N/A   Occupational History  . slef employed semi retired    Social History Main Topics  . Smoking status: Never Smoker   . Smokeless tobacco: Never Used  . Alcohol Use: 1.2 oz/week    0 Standard drinks or equivalent, 2 Glasses of wine per week     Comment: socially  . Drug Use: No  . Sexual Activity: Not on file   Other Topics Concern  . Not on file   Social History Narrative    Family History  Problem Relation Age of Onset  . Colon cancer Neg Hx   . Heart disease Father     questionable  . Stroke Mother   . Heart failure Sister   . Healthy Brother   . Healthy Sister   . Parkinsonism Brother     ROS- All systems are reviewed and negative except as per the HPI above  Physical Exam: Filed Vitals:   02/03/15 1347  BP: 82/54  Pulse: 69  Height: 5\' 7"  (1.702 m)  Weight: 149 lb 9.6 oz (67.858 kg)    GEN- The patient is well appearing, alert and oriented x 3 today.   Head- normocephalic, atraumatic Eyes-  Sclera clear, conjunctiva pink Ears- hearing intact Oropharynx- clear Neck- supple, no JVP Lymph- no cervical lymphadenopathy Lungs- Clear to ausculation bilaterally, normal work of breathing Heart- Regular rate and rhythm, no murmurs, rubs or gallops, PMI not laterally displaced GI- soft, NT, ND, + BS Extremities- no clubbing, cyanosis, or edema MS- no significant deformity or atrophy Skin- no rash or lesion Psych- euthymic mood, full affect Neuro- strength and sensation are intact  EKG-NSR at 69 bpm, LAD, pr int 166 ms qrs int 138 ms, qtc 462 ms Epic records reviewed  Assessment and Plan: 1. PAF AFib has now resolved I discussed with the research nurse and  pt cannot be started on prn cardizem  due to the restrictions of the study. He can be started on amiodarone if needed or have an ablation Will follow afib burden and plan accordingly if should become more frequent  2. Hypotension BP rechecked 94/60 Bmet will be checked with increase in lasix  F/u as  needed in afib clinic  Lupita Leash C. [facility] [address] [place], [place] [address] [phone]    Talked to

## 2015-02-09 ENCOUNTER — Encounter: Payer: Medicare Other | Admitting: *Deleted

## 2015-02-09 VITALS — BP 107/58 | HR 67 | Resp 12 | Wt 149.5 lb

## 2015-02-09 DIAGNOSIS — Z006 Encounter for examination for normal comparison and control in clinical research program: Secondary | ICD-10-CM

## 2015-02-09 NOTE — Progress Notes (Signed)
[name] came in for his 2 week GENETIC-AF follow-up visit. [name] was in sinus rhythm. He returned unused study drug. He was 100% compliant. Two new study drug packets were dispensed. Packet [id number] and [id number] were given to the patient. [name] was instructed to hold study drug the morning of his next visit 02/23/2015.

## 2015-02-17 ENCOUNTER — Ambulatory Visit (INDEPENDENT_AMBULATORY_CARE_PROVIDER_SITE_OTHER): Payer: Medicare Other

## 2015-02-17 DIAGNOSIS — Z95810 Presence of automatic (implantable) cardiac defibrillator: Secondary | ICD-10-CM | POA: Diagnosis not present

## 2015-02-17 DIAGNOSIS — I5022 Chronic systolic (congestive) heart failure: Secondary | ICD-10-CM | POA: Diagnosis not present

## 2015-02-18 ENCOUNTER — Ambulatory Visit (INDEPENDENT_AMBULATORY_CARE_PROVIDER_SITE_OTHER): Payer: Medicare Other | Admitting: Pharmacist Clinician (PhC)/ Clinical Pharmacy Specialist

## 2015-02-18 DIAGNOSIS — Z7901 Long term (current) use of anticoagulants: Secondary | ICD-10-CM

## 2015-02-18 DIAGNOSIS — I4891 Unspecified atrial fibrillation: Secondary | ICD-10-CM | POA: Diagnosis not present

## 2015-02-18 DIAGNOSIS — I480 Paroxysmal atrial fibrillation: Secondary | ICD-10-CM

## 2015-02-18 LAB — POCT INR: INR: 1.8

## 2015-02-18 NOTE — Progress Notes (Signed)
EPIC Encounter for ICM Monitoring  Patient Name: [name] is a 75 y.o. male Date: 02/18/2015 Primary Care Physican: [name], MD Primary Cardiologist: [name] Electrophysiologist: Allred  Dry Weight: 150 lbs       In the past month, have you:  1. Gained more than 2 pounds in a day or more than 5 pounds in a week? no  2. Had changes in your medications (with verification of current medications)? no  3. Had more shortness of breath than is usual for you? no  4. Limited your activity because of shortness of breath? no  5. Not been able to sleep because of shortness of breath? no  6. Had increased swelling in your feet or ankles? no  7. Had symptoms of dehydration (dizziness, dry mouth, increased thirst, decreased urine output) no  8. Had changes in sodium restriction? no  9. Been compliant with medication? Yes   ICM trend: 3 month view for 02/17/2015   ICM trend: 1 year view for 02/17/2015   Follow-up plan: ICM clinic phone appointment 03/23/2015.  Optivol thoracic impedance below baseline 1/102017 to 02/10/2015 and then returned to baseline.  He stated he is doing well.  He continues to try to follow low sodium diet.  Denied any HF symptoms at this time.     Patient asked if he should continue to take Furosemide 40 mg daily and confirmed Dr Rayann Heman ordered that dosage at last ICM call on 01/26/2015 and he should continue that dosage.  If any further recommendations from Dr Rayann Heman or Dr [name], will call him back.     Copy of note sent to patient's primary care physician, primary cardiologist, and device following physician.  [name], RN, CCM 02/18/2015 10:36 AM

## 2015-02-23 ENCOUNTER — Encounter: Payer: Self-pay | Admitting: *Deleted

## 2015-02-23 DIAGNOSIS — Z006 Encounter for examination for normal comparison and control in clinical research program: Secondary | ICD-10-CM

## 2015-02-23 NOTE — Progress Notes (Signed)
[name] was in for his 4 week GENETIC-AF visit. He has had no adverse events or hospitalizations since his last visit. He was 100% compliant with his study drug. He returned Kits [id number] and [id number]. Kits # [id number] were dispensed at this visit.

## 2015-03-04 ENCOUNTER — Encounter: Payer: Self-pay | Admitting: *Deleted

## 2015-03-04 DIAGNOSIS — Z006 Encounter for examination for normal comparison and control in clinical research program: Secondary | ICD-10-CM

## 2015-03-04 NOTE — Progress Notes (Signed)
Spoke with [name] re: a lasix decrease for Mr. Meinders.  He presently is taking 40 mgs due to elevated Optival levels in December and January.  As of 02/18/15 Optival readings were back at baseline.  Patient wants to decrease dose to 20mg s.  [name] gave orders that it would be ok to decrease dose over an alternating schedule, lasting for a couple of days.  Patient to take 20 mgs lasix today, 40 mgs, Saturday March 05, 2015 and 20 mgs Sunday March 06, 2015.  Then continue on 20 mgs lasix daily.  However, if patient feels need to increase back to 40 mgs daily to do so.  Instructions given to patient who wrote down dates and amounts and verbalized understanding to call office for any issues.

## 2015-03-10 ENCOUNTER — Encounter: Payer: Self-pay | Admitting: Gastroenterology

## 2015-03-10 ENCOUNTER — Ambulatory Visit (INDEPENDENT_AMBULATORY_CARE_PROVIDER_SITE_OTHER): Payer: Medicare Other | Admitting: Gastroenterology

## 2015-03-10 VITALS — Ht 67.0 in | Wt 154.6 lb

## 2015-03-10 DIAGNOSIS — Z1211 Encounter for screening for malignant neoplasm of colon: Secondary | ICD-10-CM | POA: Diagnosis not present

## 2015-03-10 DIAGNOSIS — K589 Irritable bowel syndrome without diarrhea: Secondary | ICD-10-CM

## 2015-03-10 DIAGNOSIS — Z7901 Long term (current) use of anticoagulants: Secondary | ICD-10-CM | POA: Diagnosis not present

## 2015-03-10 DIAGNOSIS — K219 Gastro-esophageal reflux disease without esophagitis: Secondary | ICD-10-CM | POA: Diagnosis not present

## 2015-03-10 MED ORDER — DICYCLOMINE HCL 10 MG PO CAPS: 10.0000 mg | ORAL_CAPSULE | Freq: Two times a day (BID) | ORAL | Status: DC

## 2015-03-10 MED ORDER — OMEPRAZOLE 20 MG PO CPDR: 20.0000 mg | DELAYED_RELEASE_CAPSULE | Freq: Every day | ORAL | Status: DC

## 2015-03-10 NOTE — Patient Instructions (Signed)
We have sent medications to your pharmacy for you to pick up at your convenience   

## 2015-03-10 NOTE — Progress Notes (Signed)
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 He has been followed by Dr. Deatra Ina and Dr. Sharlett Iles in the past for these issues and functional dyspepsia.   At present time he is taking esomeprazole 40mg  once daily and states it is expensive and was wondering if he could switch to another regimen. He thinks he has been on it for "years". He thinks it works well for his reflux and he tolerates it quite well. He reports he has no breakthrough of his symptoms when taking nexium. He denies dysphagia. No vomiting. No abdominal pains. He has tried taking it every other day and has maintained fair control of symptoms. He is not sure if he has been on other PPIs in the past. He had a remote EGD in the 1990s which did not show any evidence of Barrett's.   He otherwise has taken bentyl in the past for abdominal cramps / spasms which works well for him, asking for a refill. He takes it only rarely.    Finally, he reports he has not had prior colonoscopies for screening due to his extensive cardiac history. He had a negative cologuard in Jan 2015 and is wondering when he should have screening again. No FH of colon cancer.  He takes coumadin chronically.   EGD 10/96 - normal esophagus, gastritis   Past Medical History  Diagnosis Date  . Coronary artery disease     Hx MI 1992, CABG 1998 , Nuc study 11.2013 large scar but no ischemia  . 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 Paroxysmal atrial fibrillation (HCC)   . GERD (gastroesophageal reflux disease)   . H. pylori infection     Hx of   . Cardiomyopathy, ischemic 2011    with EF 25-35% by echo  . H/O myocardial infarction, greater than 8 weeks 1992    large ant wall injury  . NS[facility] (nonsustained ventricular tachycardia) [facility])      Past Surgical History  Procedure Laterality Date  . Nasal sinus surgery  05/31/2010    Dr.  Benjamine Mola  . Coronary artery bypass graft  1998    x 5  . Cataract extraction, bilateral    . Knee surgery      right  . Hernia repair    . Icd generator change  01/2008    medtronic, hx+ EP study   Family History  Problem Relation Age of Onset  . Colon cancer Neg Hx   . Heart disease Father     questionable  . Stroke Mother   . Heart failure Sister   . Healthy Brother   . Healthy Sister   . Parkinsonism Brother    Social History  Substance Use Topics  . Smoking status: Never Smoker   . Smokeless tobacco: Never Used  . Alcohol Use: 1.2 oz/week    0 Standard drinks or equivalent, 2 Glasses of wine per week     Comment: socially   Current Outpatient Prescriptions  Medication Sig Dispense Refill  . acetaminophen (TYLENOL) 650 MG CR tablet Take 650 mg by mouth 2 (two) times daily.     Marland Kitchen allopurinol (ZYLOPRIM) 300 MG tablet Take 150 mg by mouth daily as needed (Gout).     . AMBULATORY NON FORMULARY MEDICATION Take 1 tablet by mouth 2 (two) times daily. Medication Name: GENETIC-AF study drug  Metoprolol XL 200 mg QD vs Bucindolol 50 mg BID    . aspirin 81 MG chewable tablet Chew 1 tablet (81 mg total) by mouth on Monday, Wednesday, & Friday. 12 tablet 11  . cetirizine (ZYRTEC) 10 MG tablet Take 10 mg by mouth daily.      . Cholecalciferol (VITAMIN D) 2000 UNITS CAPS Take 1 capsule by mouth daily.    Marland Kitchen esomeprazole (NEXIUM) 40 MG capsule Take 1 capsule (40 mg total) by mouth daily. 30 capsule 11  . furosemide (LASIX) 40 MG tablet Take 1 tablet (40 mg total) by mouth daily. 30 tablet 6  . glycopyrrolate (ROBINUL) 2 MG tablet Take 2 mg by mouth 2 (two) times daily as needed (stomach).    . isosorbide mononitrate (IMDUR) 30 MG 24 hr tablet Take 15 mg by mouth daily.    Marland Kitchen lisinopril (PRINIVIL,ZESTRIL) 20 MG tablet Take 1 tablet (20 mg total) by mouth at bedtime. 90 tablet 3  . magnesium oxide (MAG-OX) 400 MG tablet Take 400 mg by mouth daily.    . mometasone (ASMANEX) 220 MCG/INH inhaler  Inhale 1 puff into the lungs daily.     . mometasone (NASONEX) 50 MCG/ACT nasal spray Place 2 sprays into the nose daily as needed (allergies).     . montelukast (SINGULAIR) 10 MG tablet Take 1 tablet (10 mg total) by mouth at bedtime. PLEASE SCHEDULE APPOINTMENT. 30 tablet 0  . nitroGLYCERIN (NITROSTAT) 0.4 MG SL tablet Place 1 tablet (0.4 mg total) under the tongue every 5 (five) minutes as needed for chest pain. 25 tablet 1  . silodosin (RAPAFLO) 4 MG CAPS capsule Take 8 mg by mouth. Every 2 days    . simvastatin (ZOCOR) 40 MG tablet TAKE ONE TABLET BY MOUTH AT BEDTIME 90 tablet 3  . spironolactone (ALDACTONE) 25 MG tablet Take 0.5 tablets (12.5 mg total) by mouth daily. 30 tablet 11  . warfarin (COUMADIN) 5 MG tablet Take 1 tablet by mouth daily or as directed by coumadin clinic 90 tablet 0  . dicyclomine (BENTYL) 10 MG capsule Take 1 capsule (10 mg total) by mouth 2 (two) times daily. 30 capsule 3  . omeprazole (PRILOSEC) 20 MG capsule Take 1 capsule (20 mg total) by mouth daily. 90 capsule 3   No current facility-administered medications for this visit.   Allergies  Allergen Reactions  . Amoxicillin Rash    unknown  . Penicillins Rash    unknown     Review of Systems: All systems reviewed and negative except where noted in HPI.   Lab Results  Component Value Date   CREATININE 1.00 02/03/2015   BUN 14 02/03/2015   NA 137 02/03/2015   K 4.8 02/03/2015   CL 101 02/03/2015   CO2 29 02/03/2015    Lab Results  Component Value Date   WBC 10.4 06/01/2014   HGB 14.5 06/01/2014   HCT 45.4 06/01/2014   MCV 86.3 06/01/2014   PLT 232 06/01/2014     Physical Exam: Ht 5\' 7"  (1.702 m)  Wt 154 lb 9.6 oz (70.126 kg)  BMI 24.21 kg/m2 Constitutional: Pleasant,well-developed, male in no acute distress. HEENT: Normocephalic and atraumatic. Conjunctivae are normal. No scleral icterus. Neck supple.  Cardiovascular: Normal rate, regular rhythm.  Pulmonary/chest: Effort normal and  breath sounds normal. No wheezing, rales or rhonchi. Abdominal: Soft, nondistended, nontender. Bowel sounds active throughout. There are no masses palpable. No hepatomegaly. Extremities: no edema Lymphadenopathy: No cervical adenopathy noted. Neurological: Alert and oriented to person  place and time. Skin: Skin is warm and dry. No rashes noted. Psychiatric: Normal mood and affect. Behavior is normal.   ASSESSMENT AND PLAN: 75 y/o male with a significant cardiac history as above on chronic coumadin, here for follow up for the issues as outlined below:  GERD - longstanding symptoms well controlled with PPI. Remote EGD showed no Barrett's. He has no alarm symptoms. Due to cost he wishes to switch from nexium to omeprazole. I discussed the risks of long term PPIs with current data to include increased risk for chronic kidney disease, increased risk of fracture, increased risk of C diff, increased risk of pneumonia (short term usage), potentially increased risk of dementia, potentially increased risk of B12 / calcium deficiency, and rare risk of hypomagnesemia. Recent studies have also shown an association with increased risk of cardiovascular outcomes including stroke. These studies have showed an association between PPIs and several of these outcomes but no evidence of causality. The patient was counseled to use the lowest daily use of PPI needed to control symptoms, or to use it PRN. He will use omeprazole 20mg  and will try to use it PRN moving forward. Renal function is currently normal and would recommend checking this yearly while on PPI therapy.   CRC screening - no alarm symptoms. Negative Cologuard in 2015. He wants to avoid colonoscopy if possible given his comorbidities. Given his cardiac issues and his age, it is not unreasonable to stop colon cancer screening as he has no symptoms and no history of known colon polyps. If he wished to continue screening, he could consider a Cologuard in 2018. He  can follow up at that time in the clinic to discuss this issue further.   IBS - stable, will given Bentyl to use PRN, works well for him, follow up as needed.   Kenosha Cellar, MD [facility] Pager [phone]

## 2015-03-11 ENCOUNTER — Ambulatory Visit (INDEPENDENT_AMBULATORY_CARE_PROVIDER_SITE_OTHER): Payer: Medicare Other | Admitting: Pharmacist Clinician (PhC)/ Clinical Pharmacy Specialist

## 2015-03-11 DIAGNOSIS — Z7901 Long term (current) use of anticoagulants: Secondary | ICD-10-CM

## 2015-03-11 DIAGNOSIS — I4891 Unspecified atrial fibrillation: Secondary | ICD-10-CM | POA: Diagnosis not present

## 2015-03-11 DIAGNOSIS — I480 Paroxysmal atrial fibrillation: Secondary | ICD-10-CM | POA: Diagnosis not present

## 2015-03-11 LAB — POCT INR: INR: 1.9

## 2015-03-23 ENCOUNTER — Other Ambulatory Visit: Payer: Self-pay | Admitting: *Deleted

## 2015-03-23 ENCOUNTER — Encounter: Payer: Self-pay | Admitting: *Deleted

## 2015-03-23 ENCOUNTER — Ambulatory Visit (INDEPENDENT_AMBULATORY_CARE_PROVIDER_SITE_OTHER): Payer: Medicare Other

## 2015-03-23 DIAGNOSIS — Z006 Encounter for examination for normal comparison and control in clinical research program: Secondary | ICD-10-CM

## 2015-03-23 DIAGNOSIS — I5022 Chronic systolic (congestive) heart failure: Secondary | ICD-10-CM

## 2015-03-23 DIAGNOSIS — Z95810 Presence of automatic (implantable) cardiac defibrillator: Secondary | ICD-10-CM

## 2015-03-23 NOTE — Progress Notes (Signed)
EPIC Encounter for ICM Monitoring  Patient Name: [name] is a 75 y.o. male Date: 03/23/2015 Primary Care Physican: [name], MD Primary Cardiologist: [name] Electrophysiologist: Allred Dry Weight: 150 lbs       In the past month, have you:  1. Gained more than 2 pounds in a day or more than 5 pounds in a week? no  2. Had changes in your medications (with verification of current medications)? no  3. Had more shortness of breath than is usual for you? no  4. Limited your activity because of shortness of breath? no  5. Not been able to sleep because of shortness of breath? no  6. Had increased swelling in your feet or ankles? no  7. Had symptoms of dehydration (dizziness, dry mouth, increased thirst, decreased urine output) no  8. Had changes in sodium restriction? no  9. Been compliant with medication? Yes   ICM trend: 3 month view for 03/23/2015   ICM trend: 1 year view for 03/23/2015  Follow-up plan: ICM clinic phone appointment on 04/08/2015.  Optivol thoracic impedance below reference line ~03/04/2015 to 03/19/2015 suggesting fluid accumulation.  He denied any fluid symptoms and reviewed symptoms he should report.  He reported he decreased the Lasix from 40 mg daily to 20 mg daily about 2-3 weeks ago (MD unaware).   He stated he feels dizzy or lightheaded when he takes 40 mg.   He would prefer to continue on Furosemide 20 mg and repeat transmission on 04/08/2015. Recommended if he has fluid symptoms to take the prescribed dosage of 40 mg.   Advised to limit salt intake.  No changes today.     Copy of note sent to patient's primary care physician, primary cardiologist, and device following physician.  [name], RN, CCM 03/23/2015 2:11 PM

## 2015-03-23 NOTE — Progress Notes (Signed)
[name] was in for his 8 week GENETIC-AF visit. He has had no adverse events or hospitalizations since his last visit. He was 100% compliant with his study drug. He returned all previously dispensed kits. Kit# [id number] was dispensed at this visit.

## 2015-03-28 DIAGNOSIS — M25552 Pain in left hip: Secondary | ICD-10-CM | POA: Diagnosis not present

## 2015-03-29 ENCOUNTER — Ambulatory Visit (INDEPENDENT_AMBULATORY_CARE_PROVIDER_SITE_OTHER): Payer: Medicare Other | Admitting: Pharmacist Clinician (PhC)/ Clinical Pharmacy Specialist

## 2015-03-29 DIAGNOSIS — I4891 Unspecified atrial fibrillation: Secondary | ICD-10-CM

## 2015-03-29 DIAGNOSIS — I480 Paroxysmal atrial fibrillation: Secondary | ICD-10-CM

## 2015-03-29 DIAGNOSIS — Z7901 Long term (current) use of anticoagulants: Secondary | ICD-10-CM | POA: Diagnosis not present

## 2015-03-29 LAB — POCT INR: INR: 1.7

## 2015-03-30 DIAGNOSIS — M25552 Pain in left hip: Secondary | ICD-10-CM | POA: Diagnosis not present

## 2015-04-01 ENCOUNTER — Telehealth: Payer: Self-pay

## 2015-04-01 ENCOUNTER — Telehealth: Payer: Self-pay | Admitting: *Deleted

## 2015-04-01 NOTE — Telephone Encounter (Signed)
Spoke with Anderson Malta and advised I would change the remote date to 04/06/2015 and I will provide [name] a copy to review at the appointment on 04/07/2015.  She will contact patient to advise him to send manual transmission.     Received voice mail message from Berneda Rose in research.  She stated patient is not feeling well and per Dr [name], an appointment is scheduled with [name], PA on 04/07/2015.  She asked if remote date could be done earlier so Nicki Reaper could review the transmission at the appointment.

## 2015-04-01 NOTE — Telephone Encounter (Signed)
I received a call from [name] stating he hasn't been feeling well the past couple of days. He has complaints of being light-headed and "feeling fuzzy". He was prescribed Lasix in December due to an increase in his Optivol readings. He has gone back and forth between 40mg  and 20mg  because of how he had been feeling. I spoke with [name] and he requested that [name] come in next week to be seen. An appointment was schedule with [name] next Thursday.

## 2015-04-04 DIAGNOSIS — M25552 Pain in left hip: Secondary | ICD-10-CM | POA: Diagnosis not present

## 2015-04-06 ENCOUNTER — Ambulatory Visit (INDEPENDENT_AMBULATORY_CARE_PROVIDER_SITE_OTHER): Payer: Medicare Other

## 2015-04-06 ENCOUNTER — Encounter: Payer: Self-pay | Admitting: Physician Assistant

## 2015-04-06 DIAGNOSIS — M25552 Pain in left hip: Secondary | ICD-10-CM | POA: Diagnosis not present

## 2015-04-06 DIAGNOSIS — I5022 Chronic systolic (congestive) heart failure: Secondary | ICD-10-CM

## 2015-04-06 DIAGNOSIS — Z95810 Presence of automatic (implantable) cardiac defibrillator: Secondary | ICD-10-CM

## 2015-04-06 NOTE — Progress Notes (Signed)
EPIC Encounter for ICM Monitoring  Patient Name: [name] is a 75 y.o. male Date: 04/06/2015 Primary Care Physican: [name], MD Primary Cardiologist: [name] Electrophysiologist: Allred Dry Weight: 150 lb       In the past month, have you:  1. Gained more than 2 pounds in a day or more than 5 pounds in a week? no  2. Had changes in your medications (with verification of current medications)? no  3. Had more shortness of breath than is usual for you? no  4. Limited your activity because of shortness of breath? no  5. Not been able to sleep because of shortness of breath? no  6. Had increased swelling in your feet or ankles? no  7. Had symptoms of dehydration (dizziness, dry mouth, increased thirst, decreased urine output) no  8. Had changes in sodium restriction? no  9. Been compliant with medication? Yes   ICM trend: 3 month view for 04/06/2015   ICM trend: 1 year view for 04/06/2015   Follow-up plan: ICM clinic phone appointment 04/18/2015.  Optivol thoracic impedance continues below reference line suggesting fluid accumulation.  He reported he has not been feeling well and has had some Afib.  He has appointment with [name], PA on 04/07/2015 for follow up since he continues to have fluid accumulation.  No changes today.     Copy of note sent to patient's primary care physician, primary cardiologist, and device following physician.  [name], RN, CCM 04/06/2015 5:16 PM

## 2015-04-06 NOTE — Progress Notes (Signed)
Cardiology Office Note:    Date:  04/07/2015   ID:  [name], DOB Jun 26, 1940, MRN [id number]  PCP:  [name], MD  Cardiologist:  Dr. Shelva Majestic   Electrophysiologist:  Dr. Virl Axe / Dr. Thompson Grayer (AFib)  Chief Complaint  Patient presents with  . Congestive Heart Failure    History of Present Illness:     [name] is a 75 y.o. male with a hx of CAD s/p large ant MI in 1992 and eventual CABG [id number], ICM, systolic CHF, NSVT, s/p ICD, PAF, Warfarin anticoagulation, HL.    PAF - CHADS2-VASc=4 (age 20, vascular dz, CHF). Has taken Amiodarone in distant past.  He was having more frequent AFib and was referred to Dr. Thompson Grayer in 10/16.  GENETIC-AF participant.  [name] was stopped and patient started on study drug.  He has declined PVI ablation in the past.  Last seen by Roderic Palau, NP in AF Clinic 1/17.  He was back in NSR at that time.  PRN FU was recommended.    FU with Research recently and patient noted dizziness.  His Optivol demonstrated increased fluid index.  The patient has been taken extra Lasix as tolerated at home.  He returns for further evaluation.    He is here today with his wife as well as one of our research nurses. The patient has bounced back and forth between several doses of Lasix in the recent past. His thoracic impedance started to go down again in early February. The patient, interestingly enough, does not feel more short of breath. He does not complain of increased LE edema or increased abdominal girth. He denies chest discomfort or syncope. He denies any significant weight changes at home. He does note that with higher doses of Lasix, he feels more "foggy."" He does admit to dizziness. Denies falling. He continues to have episodes of atrial fibrillation. They do seem to be less frequent on study drug. He denies any bleeding issues.   Past Medical History  Diagnosis Date  . Coronary artery disease     Hx MI 1992, CABG 1998 , Nuc study  11.2013 large scar but no ischemia  . 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 Paroxysmal atrial fibrillation (HCC)   . GERD (gastroesophageal reflux disease)   . H. pylori infection     Hx of   . Cardiomyopathy, ischemic 2011    with EF 25-35% by echo  . H/O myocardial infarction, greater than 8 weeks 1992    large ant wall injury  . NSVT (nonsustained ventricular tachycardia) [facility])     Past Surgical History  Procedure Laterality Date  . Nasal sinus surgery  05/31/2010    Dr. Benjamine Mola  . Coronary artery bypass graft  1998    x 5  . Cataract extraction, bilateral    . Knee surgery      right  . Hernia repair    . Icd generator change  01/2008    medtronic, hx+ EP study    Current Medications: Outpatient Prescriptions Prior to Visit  Medication Sig Dispense Refill  . acetaminophen (TYLENOL) 650 MG CR tablet Take 650 mg by mouth 2 (two) times daily.     . AMBULATORY NON FORMULARY MEDICATION Take 1 tablet by mouth 2 (two) times daily. Medication Name: GENETIC-AF study drug Metoprolol XL 200 mg QD vs Bucindolol 50 mg BID    . aspirin 81 MG chewable tablet Chew 1  tablet (81 mg total) by mouth on Monday, Wednesday, & Friday. 12 tablet 11  . cetirizine (ZYRTEC) 10 MG tablet Take 10 mg by mouth daily.      . Cholecalciferol (VITAMIN D) 2000 UNITS CAPS Take 1 capsule by mouth daily.    . furosemide (LASIX) 40 MG tablet Take 1 tablet (40 mg total) by mouth daily. 30 tablet 6  . glycopyrrolate (ROBINUL) 2 MG tablet Take 2 mg by mouth 2 (two) times daily as needed (stomach).    . isosorbide mononitrate (IMDUR) 30 MG 24 hr tablet Take 15 mg by mouth daily.    . magnesium oxide (MAG-OX) 400 MG tablet Take 400 mg by mouth daily.    . mometasone (ASMANEX) 220 MCG/INH inhaler Inhale 1 puff into the lungs daily.     . mometasone (NASONEX) 50 MCG/ACT nasal spray Place 2 sprays into the nose daily as needed (allergies).     . montelukast (SINGULAIR) 10 MG  tablet Take 1 tablet (10 mg total) by mouth at bedtime. PLEASE SCHEDULE APPOINTMENT. 30 tablet 0  . nitroGLYCERIN (NITROSTAT) 0.4 MG SL tablet Place 1 tablet (0.4 mg total) under the tongue every 5 (five) minutes as needed for chest pain. 25 tablet 1  . omeprazole (PRILOSEC) 20 MG capsule Take 1 capsule (20 mg total) by mouth daily. 90 capsule 3  . silodosin (RAPAFLO) 4 MG CAPS capsule Take 8 mg by mouth. Every 2 days    . simvastatin (ZOCOR) 40 MG tablet TAKE ONE TABLET BY MOUTH AT BEDTIME 90 tablet 3  . spironolactone (ALDACTONE) 25 MG tablet Take 0.5 tablets (12.5 mg total) by mouth daily. 30 tablet 11  . warfarin (COUMADIN) 5 MG tablet Take 1 tablet by mouth daily or as directed by coumadin clinic 90 tablet 0  . allopurinol (ZYLOPRIM) 300 MG tablet Take 150 mg by mouth daily as needed (Gout).     Marland Kitchen dicyclomine (BENTYL) 10 MG capsule Take 1 capsule (10 mg total) by mouth 2 (two) times daily. 30 capsule 3  . esomeprazole (NEXIUM) 40 MG capsule Take 1 capsule (40 mg total) by mouth daily. 30 capsule 11  . lisinopril (PRINIVIL,ZESTRIL) 20 MG tablet Take 1 tablet (20 mg total) by mouth at bedtime. 90 tablet 3   No facility-administered medications prior to visit.     Allergies:   Amoxicillin and Penicillins   Social History   Social History  . Marital Status: Married    Spouse Name: N/A  . Number of Children: 3  . Years of Education: N/A   Occupational History  . slef employed semi retired    Social History Main Topics  . Smoking status: Never Smoker   . Smokeless tobacco: Never Used  . Alcohol Use: 1.2 oz/week    0 Standard drinks or equivalent, 2 Glasses of wine per week     Comment: socially  . Drug Use: No  . Sexual Activity: Not Asked   Other Topics Concern  . None   Social History Narrative     Family History:  The patient's family history includes Healthy in his brother and sister; Heart disease in his father; Heart failure in his sister; Parkinsonism in his brother;  Stroke in his mother. There is no history of Colon cancer.   ROS:   Please see the history of present illness.    Review of Systems  Cardiovascular: Positive for irregular heartbeat.  Hematologic/Lymphatic: Bruises/bleeds easily.  All other systems reviewed and are negative.   Physical Exam:  VS:  BP 118/58 mmHg  Pulse 62  Ht 5\' 7"  (1.702 m)  Wt 150 lb 12.8 oz (68.402 kg)  BMI 23.61 kg/m2   GEN: Well nourished, well developed, in no acute distress HEENT: normal Neck: no JVD, no masses Cardiac: Normal S1/S2, RRR; no murmurs,  no edema;    Respiratory:  clear to auscultation bilaterally; no wheezing, rhonchi or rales GI: soft, nontender, nondistended  MS: no deformity or atrophy Skin: warm and dry  Neuro:   no focal deficits  Psych: Alert and oriented x 3, normal affect  Wt Readings from Last 3 Encounters:  04/07/15 150 lb 12.8 oz (68.402 kg)  03/23/15 153 lb 10.6 oz (69.7 kg)  03/10/15 154 lb 9.6 oz (70.126 kg)      Studies/Labs Reviewed:     EKG:  EKG is  ordered today.  The ekg ordered today demonstrates NSR, HR 63, LBBB  Recent Labs: 05/31/2014: B Natriuretic Peptide 648.3* 06/01/2014: Hemoglobin 14.5; Platelets 232 08/31/2014: ALT 15; Magnesium 2.1 04/07/2015: BUN 11; Creat 0.89; Potassium 4.2; Sodium 136   Recent Lipid Panel    Component Value Date/Time   CHOL 141 08/31/2014 0822   TRIG 132.0 08/31/2014 0822   HDL 32.90* 08/31/2014 0822   CHOLHDL 4 08/31/2014 0822   VLDL 26.4 08/31/2014 0822   LDLCALC 82 08/31/2014 0822    Additional studies/ records that were reviewed today include:   Echo 3/16 EF 20-25%, apical DK, ant-septal, ant, ant-lat AK, Gr 2 DD, apical clot cannot be r/o, trivial AI, mod MR, mod sto severe LAE, mild reduced RVSF   ASSESSMENT:     1. Chronic systolic CHF (congestive heart failure) ([place])   2. PAF (paroxysmal atrial fibrillation) ([place])   3. Coronary artery disease involving native coronary artery of native heart without angina  pectoris   4. ICD (implantable cardioverter-defibrillator) in place   5. Hyperlipidemia     PLAN:     In order of problems listed above:  1. Chronic Systolic CHF - ICM with EF 20-25%.  Patient is NYHA 2-2b. On exam, he is euvolemic. Weights have been stable. However, he has decreasing thoracic impedance since early February. Upon further questioning, in retrospect, the patient does recall feeling better when his thoracic impedance is close to baseline. He does feel limited in the amount of Lasix he can take by hypotension which seems to be symptomatic. After lengthy discussion with the patient, his wife and our research nurse, I have elected to adjust his ACE inhibitor to allow further titration of his diuretic.  -  Decrease lisinopril to 10 mg daily  -  Increase Lasix to 40 mg 2 doses tomorrow, then take 60 mg daily for 1 week  -  After 1 week, he will resume Lasix 40 mg daily  -  Have ICM clinic recheck his OptiVol in one week  -  If he continues to have issues with volume excess, consider early follow-up with Dr. [name] to consider upgrade to CRT  2. PAF - Continue genetic AF study. Episodes of AF have been less frequent with study drug.  3. CAD - No angina.  Continue aspirin, nitrates, beta blocker, statin.  4. S/p ICD - Follow up with EP as planned.  5. HL - Continue statin.  Total time spent with the patient today was 45 minutes (evaluating the patient, coordinating care, reviewing records). Face-to-face time >50%   Medication Adjustments/Labs and Tests Ordered: Current medicines are reviewed at length with the patient today.  Concerns regarding medicines are outlined above.  Medication changes, Labs and Tests ordered today are outlined in the Patient Instructions noted below. Patient Instructions  Medication Instructions:  DECREASE Lisinopril to 10 mg Once daily (Take 1/2 tab of the 20 mg to = 10 mg)  TOMORROW (3/10):  Take Lasix 40 mg in AM and 40 mg in the Afternoon ON  Saturday (3/11):  START taking Lasix 60 mg (that is 1 and 1/2 tablets of the 40 mg) once daily. NEXT Friday (3/17): RESUME Lasix 40 mg Once daily   Labwork: TODAY - BMET, BNP  Testing/Procedures: None  Follow-Up: Dr. Shelva Majestic in April as planned.  Any Other Special Instructions Will Be Listed Below (If Applicable). I will ask Margarita Grizzle to recheck your device in a week to recheck the fluid.  If your BP remains low (less than 100 on top) and you feel dizzy or "foggy," please call.  We can decrease your medications some more if needed.  If you need a refill on your cardiac medications before your next appointment, please call your pharmacy.    Signed, [name], PA-C  04/07/2015 5:52 PM    [facility] [address], [place], [place]  [address] Phone: [phone]; Fax: [phone]

## 2015-04-07 ENCOUNTER — Ambulatory Visit (INDEPENDENT_AMBULATORY_CARE_PROVIDER_SITE_OTHER): Payer: Medicare Other | Admitting: Physician Assistant

## 2015-04-07 ENCOUNTER — Encounter: Payer: Self-pay | Admitting: Physician Assistant

## 2015-04-07 VITALS — BP 118/58 | HR 62 | Ht 67.0 in | Wt 150.8 lb

## 2015-04-07 DIAGNOSIS — Z95810 Presence of automatic (implantable) cardiac defibrillator: Secondary | ICD-10-CM | POA: Diagnosis not present

## 2015-04-07 DIAGNOSIS — I4891 Unspecified atrial fibrillation: Secondary | ICD-10-CM | POA: Diagnosis not present

## 2015-04-07 DIAGNOSIS — E785 Hyperlipidemia, unspecified: Secondary | ICD-10-CM

## 2015-04-07 DIAGNOSIS — I509 Heart failure, unspecified: Secondary | ICD-10-CM | POA: Diagnosis not present

## 2015-04-07 DIAGNOSIS — I480 Paroxysmal atrial fibrillation: Secondary | ICD-10-CM | POA: Diagnosis not present

## 2015-04-07 DIAGNOSIS — I2510 Atherosclerotic heart disease of native coronary artery without angina pectoris: Secondary | ICD-10-CM

## 2015-04-07 DIAGNOSIS — I5022 Chronic systolic (congestive) heart failure: Secondary | ICD-10-CM | POA: Diagnosis not present

## 2015-04-07 LAB — BASIC METABOLIC PANEL
BUN: 11 mg/dL (ref 7–25)
CHLORIDE: 102 mmol/L (ref 98–110)
CO2: 27 mmol/L (ref 20–31)
CREATININE: 0.89 mg/dL (ref 0.70–1.18)
Calcium: 9.1 mg/dL (ref 8.6–10.3)
GLUCOSE: 82 mg/dL (ref 65–99)
POTASSIUM: 4.2 mmol/L (ref 3.5–5.3)
SODIUM: 136 mmol/L (ref 135–146)

## 2015-04-07 LAB — BRAIN NATRIURETIC PEPTIDE: Brain Natriuretic Peptide: 450.2 pg/mL — ABNORMAL HIGH (ref <100)

## 2015-04-07 MED ORDER — LISINOPRIL 20 MG PO TABS: 10.0000 mg | ORAL_TABLET | Freq: Every day | ORAL | Status: DC

## 2015-04-07 NOTE — Patient Instructions (Addendum)
Medication Instructions:  DECREASE Lisinopril to 10 mg Once daily (Take 1/2 tab of the 20 mg to = 10 mg)  TOMORROW (3/10):  Take Lasix 40 mg in AM and 40 mg in the Afternoon ON Saturday (3/11):  START taking Lasix 60 mg (that is 1 and 1/2 tablets of the 40 mg) once daily. NEXT Friday (3/17): RESUME Lasix 40 mg Once daily   Labwork: TODAY - BMET, BNP  Testing/Procedures: None  Follow-Up: Dr. Shelva Majestic in April as planned.  Any Other Special Instructions Will Be Listed Below (If Applicable). I will ask Margarita Grizzle to recheck your device in a week to recheck the fluid.  If your BP remains low (less than 100 on top) and you feel dizzy or "foggy," please call.  We can decrease your medications some more if needed.  If you need a refill on your cardiac medications before your next appointment, please call your pharmacy.

## 2015-04-11 ENCOUNTER — Ambulatory Visit (INDEPENDENT_AMBULATORY_CARE_PROVIDER_SITE_OTHER): Payer: Medicare Other | Admitting: Pharmacist Clinician (PhC)/ Clinical Pharmacy Specialist

## 2015-04-11 DIAGNOSIS — Z7901 Long term (current) use of anticoagulants: Secondary | ICD-10-CM | POA: Diagnosis not present

## 2015-04-11 DIAGNOSIS — I4891 Unspecified atrial fibrillation: Secondary | ICD-10-CM | POA: Diagnosis not present

## 2015-04-11 DIAGNOSIS — I480 Paroxysmal atrial fibrillation: Secondary | ICD-10-CM

## 2015-04-11 LAB — POCT INR: INR: 2.2

## 2015-04-12 DIAGNOSIS — M25552 Pain in left hip: Secondary | ICD-10-CM | POA: Diagnosis not present

## 2015-04-14 DIAGNOSIS — J3089 Other allergic rhinitis: Secondary | ICD-10-CM | POA: Diagnosis not present

## 2015-04-14 DIAGNOSIS — J3081 Allergic rhinitis due to animal (cat) (dog) hair and dander: Secondary | ICD-10-CM | POA: Diagnosis not present

## 2015-04-14 DIAGNOSIS — J4540 Moderate persistent asthma, uncomplicated: Secondary | ICD-10-CM | POA: Diagnosis not present

## 2015-04-14 DIAGNOSIS — J301 Allergic rhinitis due to pollen: Secondary | ICD-10-CM | POA: Diagnosis not present

## 2015-04-15 DIAGNOSIS — M25552 Pain in left hip: Secondary | ICD-10-CM | POA: Diagnosis not present

## 2015-04-18 ENCOUNTER — Ambulatory Visit (INDEPENDENT_AMBULATORY_CARE_PROVIDER_SITE_OTHER): Payer: Medicare Other | Admitting: *Deleted

## 2015-04-18 DIAGNOSIS — I255 Ischemic cardiomyopathy: Secondary | ICD-10-CM | POA: Diagnosis not present

## 2015-04-18 DIAGNOSIS — Z95810 Presence of automatic (implantable) cardiac defibrillator: Secondary | ICD-10-CM

## 2015-04-18 DIAGNOSIS — I5022 Chronic systolic (congestive) heart failure: Secondary | ICD-10-CM | POA: Diagnosis not present

## 2015-04-18 NOTE — Progress Notes (Signed)
EPIC Encounter for ICM Monitoring  Patient Name: [name] is a 75 y.o. male Date: 04/18/2015 Primary Care Physican: [name], MD Primary Cardiologist: [name] Electrophysiologist: Allred Dry Weight: 150 lb   In the past month, have you:  1. Gained more than 2 pounds in a day or more than 5 pounds in a week? no  2. Had changes in your medications (with verification of current medications)? no  3. Had more shortness of breath than is usual for you? no  4. Limited your activity because of shortness of breath? no  5. Not been able to sleep because of shortness of breath? no  6. Had increased swelling in your feet or ankles? no  7. Had symptoms of dehydration (dizziness, dry mouth, increased thirst, decreased urine output) no  8. Had changes in sodium restriction? no  9. Been compliant with medication? Yes   ICM trend: 3 month view for 04/18/2015   ICM trend: 1 year view for 04/18/2015   Follow-up plan: ICM clinic phone appointment on 05/19/2015.  Thoracic impedance trending along reference line since increase in Furosemide x 1 week at 04/07/2015 office visit.  He has resumed prescribed Furosemide 40 mg daily on 04/15/2015 as instructed.  Patient denied any fluid symptoms.  Patient reported he thinks Afib started this morning around 2 am and he asked if it could be seen on transmission.  Explained the device has a ventricular lead and not atrial lead so unable see any Atrial Fib.  He asked if he could eat salt and explained that the total amount of salt daily should be limited 2000 mg.   Advised to read the labels to determine amount of sodium he is eating in a day.  Education given that diet high in sodium can cause fluid retention.  Encouraged to call for any fluid symptoms.  No changes today.    Copy of note sent to patient's primary care physician, primary cardiologist, and device following physician.  [name], RN, CCM 04/18/2015 8:23 AM

## 2015-04-19 ENCOUNTER — Telehealth: Payer: Self-pay | Admitting: Gastroenterology

## 2015-04-19 MED ORDER — OMEPRAZOLE 20 MG PO CPDR: 20.0000 mg | DELAYED_RELEASE_CAPSULE | Freq: Two times a day (BID) | ORAL | Status: DC

## 2015-04-19 NOTE — Progress Notes (Signed)
Remote ICD transmission.   

## 2015-04-19 NOTE — Telephone Encounter (Signed)
He can try increasing omeprazole to 20mg  BID to see if this helps. He had previous requested a switch due to cost of Nexium. If he continues to have symptoms despite higher dose omeprazole he can switch back to Nexium if he wishes, whatever he prefers. Thanks

## 2015-04-19 NOTE — Telephone Encounter (Signed)
Spoke with patient and he will try increasing Omeprazole 20 mg BID. He will call back if this does not help.

## 2015-04-19 NOTE — Telephone Encounter (Signed)
Patient states the Omeprazole 20 mg daily did not help his GERD. He has been taking Nexium OTC and is has helped some. He is asking what he should do. Go back on Nexium or try something else. Please, advise.

## 2015-04-20 ENCOUNTER — Encounter: Payer: Self-pay | Admitting: Cardiology

## 2015-04-20 LAB — CUP PACEART REMOTE DEVICE CHECK
Battery Voltage: 2.79 V
Brady Statistic RV Percent Paced: 0 %
HIGH POWER IMPEDANCE MEASURED VALUE: 54 Ohm
HIGH POWER IMPEDANCE MEASURED VALUE: 84 Ohm
Implantable Lead Implant Date: 20010806
Implantable Lead Location: 753860
Implantable Lead Serial Number: 104581
MDC IDC LEAD MODEL: 147
MDC IDC MSMT LEADCHNL RV IMPEDANCE VALUE: 1136 Ohm
MDC IDC SESS DTM: 20170320062728
MDC IDC SET LEADCHNL RV PACING AMPLITUDE: 2.5 V
MDC IDC SET LEADCHNL RV PACING PULSEWIDTH: 0.7 ms
MDC IDC SET LEADCHNL RV SENSING SENSITIVITY: 0.3 mV

## 2015-04-25 ENCOUNTER — Telehealth: Payer: Self-pay | Admitting: *Deleted

## 2015-04-25 DIAGNOSIS — I480 Paroxysmal atrial fibrillation: Secondary | ICD-10-CM

## 2015-04-25 MED ORDER — LISINOPRIL 5 MG PO TABS: 5.0000 mg | ORAL_TABLET | Freq: Every day | ORAL | Status: DC

## 2015-04-25 NOTE — Telephone Encounter (Signed)
Called [name] to give him the [facility] lab results. The results are not clinically significant per Dr. Rayann Heman and will be scanned into EMR. [name] states he continues to have periods of being light headed/foggy. It has improved since the decrease in the lisinopril but has not gone away completely.

## 2015-04-25 NOTE — Telephone Encounter (Signed)
Labs 04/21/15: K 5.1, BUN 13, SCr 0.95 Ok to decrease Lisinopril to 5 mg QD. Please send Rx to his pharmacy for Lisinopril 5 mg QD. [name], PA-C   04/25/2015 5:49 PM

## 2015-04-25 NOTE — Addendum Note (Signed)
Addended by: Michae Kava on: 04/25/2015 06:12 PM   Modules accepted: Orders

## 2015-04-25 NOTE — Telephone Encounter (Signed)
Pt aware Rx for Lisinopril 5 mg sent; Pt asked for 30 dys .

## 2015-05-02 ENCOUNTER — Telehealth: Payer: Self-pay | Admitting: *Deleted

## 2015-05-02 ENCOUNTER — Ambulatory Visit (INDEPENDENT_AMBULATORY_CARE_PROVIDER_SITE_OTHER): Payer: Medicare Other | Admitting: Pharmacist Clinician (PhC)/ Clinical Pharmacy Specialist

## 2015-05-02 ENCOUNTER — Telehealth: Payer: Self-pay

## 2015-05-02 DIAGNOSIS — Z7901 Long term (current) use of anticoagulants: Secondary | ICD-10-CM

## 2015-05-02 DIAGNOSIS — I4891 Unspecified atrial fibrillation: Secondary | ICD-10-CM

## 2015-05-02 DIAGNOSIS — I480 Paroxysmal atrial fibrillation: Secondary | ICD-10-CM | POA: Diagnosis not present

## 2015-05-02 LAB — POCT INR: INR: 3

## 2015-05-02 NOTE — Telephone Encounter (Signed)
Received call from Berneda Rose, RN - research.  She reported patient not feeling on the current dosage of Furosemide and asked to have remote ICM transmission sent to look at fluid levels.  Advised patient will need to send manual transmission.  She will call patient and have him send it on 05/03/2015.  Will review and call her back tomorrow.

## 2015-05-02 NOTE — Telephone Encounter (Signed)
[name] called stating he has had some shortness of breath the past couple of days. He denies any chest pain. He is able to do his daily walks but notices an increase in shortness of breath. I discussed with [name] who saw him a couple of weeks ago who recommends doing an Optivol reading in the morning. [name] was instructed to send a reading in the morning. I instructed him to go to the ER if his shortness of breath gets more severe.

## 2015-05-03 ENCOUNTER — Ambulatory Visit (INDEPENDENT_AMBULATORY_CARE_PROVIDER_SITE_OTHER): Payer: Medicare Other

## 2015-05-03 DIAGNOSIS — Z95810 Presence of automatic (implantable) cardiac defibrillator: Secondary | ICD-10-CM | POA: Diagnosis not present

## 2015-05-03 DIAGNOSIS — J301 Allergic rhinitis due to pollen: Secondary | ICD-10-CM | POA: Diagnosis not present

## 2015-05-03 DIAGNOSIS — I5022 Chronic systolic (congestive) heart failure: Secondary | ICD-10-CM

## 2015-05-03 DIAGNOSIS — J3089 Other allergic rhinitis: Secondary | ICD-10-CM | POA: Diagnosis not present

## 2015-05-03 DIAGNOSIS — J4540 Moderate persistent asthma, uncomplicated: Secondary | ICD-10-CM | POA: Diagnosis not present

## 2015-05-03 DIAGNOSIS — J3081 Allergic rhinitis due to animal (cat) (dog) hair and dander: Secondary | ICD-10-CM | POA: Diagnosis not present

## 2015-05-03 NOTE — Progress Notes (Signed)
Agree [name], PA-C   05/03/2015 5:56 PM

## 2015-05-03 NOTE — Progress Notes (Signed)
EPIC Encounter for ICM Monitoring  Patient Name: [name] is a 75 y.o. male Date: 05/03/2015 Primary Care Physican: [name], MD Primary Cardiologist: [name] Electrophysiologist: Allred/Klein Dry Weight: 143 lb   In the past month, have you:  1. Gained more than 2 pounds in a day or more than 5 pounds in a week? no  2. Had changes in your medications (with verification of current medications)? Decreased Lisinopril to 5 mg  3. Had more shortness of breath than is usual for you? Yes  4. Limited your activity because of shortness of breath? no  5. Not been able to sleep because of shortness of breath? no  6. Had increased swelling in your feet or ankles? no  7. Had symptoms of dehydration (dizziness, dry mouth, increased thirst, decreased urine output) no  8. Had changes in sodium restriction? no  9. Been compliant with medication? Yes   ICM trend: 3 month view for 05/03/2015   ICM trend: 1 year view for 05/03/2015   Follow-up plan: ICM clinic phone appointment on 05/19/2015.  Office appointment with Dr [name] 05/23/2015.  Thoracic impedance below reference line from 04/20/2015 to 05/03/2015 suggesting fluid accumulation and thoracic impedance was at baseline x 2 days during that time.  Patient reported he has had SOB in the last week and it was worse during the night when lying down.  He requested I discuss with Jason Dopp, PA since he saw [facility] on 04/07/2015.  He reported he does have sinus congestion and going to physician today for follow up.  He stated BP was 120/74 since Lisinopril was decreased from 10mg  to 5 mg.          Reviewed transmission with Jason Dopp, PA.  He ordered increase Furosemide to 40 mg bid x 3 days and then return to previous dosage of Furosemide 40 mg daily.  BMET in a week.         Notified patient of orders from [place], [place] and he verbalized understanding.  Advised lab appointment in one week, 05/10/2015.  Encouraged to call if he has any changes  or worsening of symptoms.   Copy of note sent to patient's primary care physician, primary cardiologist, and device following physician.  [name], RN, CCM 05/03/2015 9:28 AM

## 2015-05-06 ENCOUNTER — Encounter: Payer: Self-pay | Admitting: Cardiology

## 2015-05-10 ENCOUNTER — Other Ambulatory Visit (INDEPENDENT_AMBULATORY_CARE_PROVIDER_SITE_OTHER): Payer: Medicare Other

## 2015-05-10 DIAGNOSIS — I5022 Chronic systolic (congestive) heart failure: Secondary | ICD-10-CM

## 2015-05-10 LAB — BASIC METABOLIC PANEL
BUN: 12 mg/dL (ref 7–25)
CHLORIDE: 103 mmol/L (ref 98–110)
CO2: 27 mmol/L (ref 20–31)
Calcium: 9.3 mg/dL (ref 8.6–10.3)
Creat: 0.79 mg/dL (ref 0.70–1.18)
Glucose, Bld: 78 mg/dL (ref 65–99)
POTASSIUM: 4.3 mmol/L (ref 3.5–5.3)
Sodium: 139 mmol/L (ref 135–146)

## 2015-05-11 ENCOUNTER — Encounter (HOSPITAL_COMMUNITY): Payer: Self-pay | Admitting: Nurse Practitioner

## 2015-05-11 ENCOUNTER — Ambulatory Visit (HOSPITAL_COMMUNITY)
Admission: RE | Admit: 2015-05-11 | Discharge: 2015-05-11 | Disposition: A | Discharge status: Home / Self Care | Payer: Medicare Other | Source: Ambulatory Visit | Attending: Nurse Practitioner | Admitting: Nurse Practitioner

## 2015-05-11 VITALS — BP 108/58 | HR 61 | Ht 67.0 in | Wt 151.0 lb

## 2015-05-11 DIAGNOSIS — I472 Ventricular tachycardia: Secondary | ICD-10-CM | POA: Insufficient documentation

## 2015-05-11 DIAGNOSIS — I959 Hypotension, unspecified: Secondary | ICD-10-CM | POA: Diagnosis not present

## 2015-05-11 DIAGNOSIS — I2510 Atherosclerotic heart disease of native coronary artery without angina pectoris: Secondary | ICD-10-CM | POA: Diagnosis not present

## 2015-05-11 DIAGNOSIS — Z880 Allergy status to penicillin: Secondary | ICD-10-CM | POA: Insufficient documentation

## 2015-05-11 DIAGNOSIS — I252 Old myocardial infarction: Secondary | ICD-10-CM | POA: Diagnosis not present

## 2015-05-11 DIAGNOSIS — Z7901 Long term (current) use of anticoagulants: Secondary | ICD-10-CM | POA: Insufficient documentation

## 2015-05-11 DIAGNOSIS — R42 Dizziness and giddiness: Secondary | ICD-10-CM | POA: Insufficient documentation

## 2015-05-11 DIAGNOSIS — Z95810 Presence of automatic (implantable) cardiac defibrillator: Secondary | ICD-10-CM | POA: Diagnosis not present

## 2015-05-11 DIAGNOSIS — K219 Gastro-esophageal reflux disease without esophagitis: Secondary | ICD-10-CM | POA: Diagnosis not present

## 2015-05-11 DIAGNOSIS — I255 Ischemic cardiomyopathy: Secondary | ICD-10-CM | POA: Insufficient documentation

## 2015-05-11 DIAGNOSIS — I480 Paroxysmal atrial fibrillation: Secondary | ICD-10-CM | POA: Diagnosis not present

## 2015-05-11 DIAGNOSIS — Z951 Presence of aortocoronary bypass graft: Secondary | ICD-10-CM | POA: Diagnosis not present

## 2015-05-11 DIAGNOSIS — Z7982 Long term (current) use of aspirin: Secondary | ICD-10-CM | POA: Insufficient documentation

## 2015-05-11 DIAGNOSIS — Z0189 Encounter for other specified special examinations: Secondary | ICD-10-CM | POA: Diagnosis not present

## 2015-05-11 NOTE — Progress Notes (Signed)
Patient ID: [name], male   DOB: 24-May-1940, 75 y.o.   MRN: [id number]     Primary Care Physician: [name], MD Referring Physician: Dr. Malissa Hippo [name] is a 75 y.o. male with a h/o PAF that is here for f/u of afib clinic. He reports that he has afib every 2 two for a few hours, longest epiosode two days.  He is in the genetic AF trial,  he stopped tikosyn,required for initiation of study, but was failing drug anyway He is on study drug. He feels that he tolerates breakthrough afib much better on study drug.His optimal is watched closely by device clinic and he receives feedback from them every few weeks for any increase in lasix that is needed. He feels occasional dizziness more with position change. An ablation had been discussed with pt in the past with Dr. Johney Frame but he was hesitant to proceed. Question long term success of ablation with left atrial size at 49 %. He weighs himself daily.   Today, he denies symptoms of palpitations, chest pain, shortness of breath, orthopnea, PND, lower extremity edema, dizziness, presyncope, syncope, or neurologic sequela. The patient is tolerating medications without difficulties and is otherwise without complaint today.   Past Medical History  Diagnosis Date  . Coronary artery disease     Hx MI 1992, CABG 1998 , Nuc study 11.2013 large scar but no ischemia  . 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 Paroxysmal atrial fibrillation (HCC)   . GERD (gastroesophageal reflux disease)   . H. pylori infection     Hx of   . Cardiomyopathy, ischemic 2011    with EF 25-35% by echo  . H/O myocardial infarction, greater than 8 weeks 1992    large ant wall injury  . NSVT (nonsustained ventricular tachycardia) [facility])    Past Surgical History  Procedure Laterality Date  . Nasal sinus surgery  05/31/2010    Dr. Suszanne Conners  . Coronary artery bypass graft  1998    x 5  . Cataract extraction, bilateral      . Knee surgery      right  . Hernia repair    . Icd generator change  01/2008    medtronic, hx+ EP study    Current Outpatient Prescriptions  Medication Sig Dispense Refill  . acetaminophen (TYLENOL) 650 MG CR tablet Take 650 mg by mouth 2 (two) times daily.     . AMBULATORY NON FORMULARY MEDICATION Take 1 tablet by mouth 2 (two) times daily. Medication Name: GENETIC-AF study drug Metoprolol XL 200 mg QD vs Bucindolol 50 mg BID    . aspirin 81 MG chewable tablet Chew 1 tablet (81 mg total) by mouth on Monday, Wednesday, & Friday. 12 tablet 11  . cetirizine (ZYRTEC) 10 MG tablet Take 10 mg by mouth daily.      . Cholecalciferol (VITAMIN D) 2000 UNITS CAPS Take 1 capsule by mouth daily.    Marland Kitchen dicyclomine (BENTYL) 10 MG/5ML syrup Take by mouth 2 (two) times daily as needed.    . furosemide (LASIX) 40 MG tablet Take 1 tablet (40 mg total) by mouth daily. 30 tablet 6  . glycopyrrolate (ROBINUL) 2 MG tablet Take 2 mg by mouth 2 (two) times daily as needed (stomach).    . isosorbide mononitrate (IMDUR) 30 MG 24 hr tablet Take 15 mg by mouth daily.    Marland Kitchen lisinopril (PRINIVIL,ZESTRIL) 5 MG tablet Take 1 tablet (  5 mg total) by mouth at bedtime. 30 tablet 1  . magnesium oxide (MAG-OX) 400 MG tablet Take 400 mg by mouth daily.    . mometasone (ASMANEX) 220 MCG/INH inhaler Inhale 1 puff into the lungs daily.     . mometasone (NASONEX) 50 MCG/ACT nasal spray Place 2 sprays into the nose daily as needed (allergies).     . montelukast (SINGULAIR) 10 MG tablet Take 1 tablet (10 mg total) by mouth at bedtime. PLEASE SCHEDULE APPOINTMENT. 30 tablet 0  . nitroGLYCERIN (NITROSTAT) 0.4 MG SL tablet Place 1 tablet (0.4 mg total) under the tongue every 5 (five) minutes as needed for chest pain. 25 tablet 1  . omeprazole (PRILOSEC) 20 MG capsule Take 1 capsule (20 mg total) by mouth 2 (two) times daily before a meal. 180 capsule 3  . silodosin (RAPAFLO) 4 MG CAPS capsule Take 8 mg by mouth. Every 2 days    .  simvastatin (ZOCOR) 40 MG tablet TAKE ONE TABLET BY MOUTH AT BEDTIME 90 tablet 3  . spironolactone (ALDACTONE) 25 MG tablet Take 0.5 tablets (12.5 mg total) by mouth daily. 30 tablet 11  . warfarin (COUMADIN) 5 MG tablet Take 1 tablet by mouth daily or as directed by coumadin clinic 90 tablet 0   No current facility-administered medications for this encounter.    Allergies  Allergen Reactions  . Amoxicillin Rash    unknown  . Penicillins Rash    unknown    Social History   Social History  . Marital Status: Married    Spouse Name: N/A  . Number of Children: 3  . Years of Education: N/A   Occupational History  . slef employed semi retired    Social History Main Topics  . Smoking status: Never Smoker   . Smokeless tobacco: Never Used  . Alcohol Use: 1.2 oz/week    0 Standard drinks or equivalent, 2 Glasses of wine per week     Comment: socially  . Drug Use: No  . Sexual Activity: Not on file   Other Topics Concern  . Not on file   Social History Narrative    Family History  Problem Relation Age of Onset  . Colon cancer Neg Hx   . Heart disease Father     questionable  . Stroke Mother   . Heart failure Sister   . Healthy Brother   . Healthy Sister   . Parkinsonism Brother     ROS- All systems are reviewed and negative except as per the HPI above  Physical Exam: Filed Vitals:   05/11/15 1121  BP: 108/58  Pulse: 61  Height: 5\' 7"  (1.702 m)  Weight: 151 lb (68.493 kg)    GEN- The patient is well appearing, alert and oriented x 3 today.   Head- normocephalic, atraumatic Eyes-  Sclera clear, conjunctiva pink Ears- hearing intact Oropharynx- clear Neck- supple, no JVP Lymph- no cervical lymphadenopathy Lungs- Clear to ausculation bilaterally, normal work of breathing Heart- Regular rate and rhythm, no murmurs, rubs or gallops, PMI not laterally displaced GI- soft, NT, ND, + BS Extremities- no clubbing, cyanosis, or edema MS- no significant deformity or  atrophy Skin- no rash or lesion Psych- euthymic mood, full affect Neuro- strength and sensation are intact  EKG-NSR at 61 bpm, with pvc noted,  pr int 168 ms, qrs int 140 ms, qtc 467 ms Epic records reviewed  Assessment and Plan: 1. PAF Maintaining SR for the most part and the time in afib is  better tolerated since starting study drug Continue genetic af trial Continue warfarin  2. Hypotension Fairly well tolerated but with occasional dizziness Encouraged slow change in position  3.LV dysfunction/HF Continue daily weights Continue recommendations for extra fluid with optimal surveillance  Research clinic in today to see pt as well    F/u as scheduled with Dr. Tresa Endo 4/24 and Dr. Graciela Husbands 6/20 afib clinic as needed  Lupita Leash [facility] [address] [place], [place] [address] [phone]    Talked to

## 2015-05-12 ENCOUNTER — Telehealth: Payer: Self-pay | Admitting: *Deleted

## 2015-05-12 NOTE — Telephone Encounter (Signed)
Pt notified of lab results by phone with verbal understanding.  

## 2015-05-16 DIAGNOSIS — J320 Chronic maxillary sinusitis: Secondary | ICD-10-CM | POA: Diagnosis not present

## 2015-05-16 DIAGNOSIS — J310 Chronic rhinitis: Secondary | ICD-10-CM | POA: Diagnosis not present

## 2015-05-19 ENCOUNTER — Ambulatory Visit (INDEPENDENT_AMBULATORY_CARE_PROVIDER_SITE_OTHER): Payer: Medicare Other

## 2015-05-19 DIAGNOSIS — I5022 Chronic systolic (congestive) heart failure: Secondary | ICD-10-CM

## 2015-05-19 DIAGNOSIS — Z95810 Presence of automatic (implantable) cardiac defibrillator: Secondary | ICD-10-CM

## 2015-05-19 NOTE — Progress Notes (Signed)
EPIC Encounter for ICM Monitoring  Patient Name: [name] is a 75 y.o. male Date: 05/19/2015 Primary Care Physican: [name], MD Primary Cardiologist: [name] Electrophysiologist: Allred/Klein Dry Weight: 143 lb   In the past month, have you:  1. Gained more than 2 pounds in a day or more than 5 pounds in a week? no  2. Had changes in your medications (with verification of current medications)? no  3. Had more shortness of breath than is usual for you? no  4. Limited your activity because of shortness of breath? no  5. Not been able to sleep because of shortness of breath? Yes, 1 night in last week.   6. Had increased swelling in your feet or ankles? no  7. Had symptoms of dehydration (dizziness, dry mouth, increased thirst, decreased urine output) no  8. Had changes in sodium restriction? no  9. Been compliant with medication? Yes   ICM trend: 3 month view for 05/19/2015   ICM trend: 1 year view for 05/19/2015   Follow-up plan: ICM clinic phone appointment on 06/09/2015.   Since last ICM transmission on 05/03/2015, thoracic impedance showed improvement on 05/06/2015 after Furosemide 40 mg was increased to bid x 3 days.  Thoracic impedance returned to below reference line from 05/07/2015 to 05/19/2015 (with exception of 05/16/2015) suggesting fluid accumulation.  He reported he had one night this week that he was unable to sleep due to SOB but he slept good last night.    Encouraged to call for any fluid symptoms.  No changes today.    Patient will be out of town week of 06/20/2015 and requested earlier transmission.    Copy of note sent to patient's primary care physician, primary cardiologist, and device following physician.  [name], RN, CCM 05/19/2015 8:20 AM

## 2015-05-23 ENCOUNTER — Ambulatory Visit (INDEPENDENT_AMBULATORY_CARE_PROVIDER_SITE_OTHER): Payer: Medicare Other | Admitting: Pharmacist Clinician (PhC)/ Clinical Pharmacy Specialist

## 2015-05-23 ENCOUNTER — Ambulatory Visit (INDEPENDENT_AMBULATORY_CARE_PROVIDER_SITE_OTHER): Payer: Medicare Other | Admitting: Cardiovascular Disease

## 2015-05-23 ENCOUNTER — Encounter: Payer: Self-pay | Admitting: Cardiovascular Disease

## 2015-05-23 VITALS — BP 99/59 | HR 64 | Ht 67.0 in | Wt 150.0 lb

## 2015-05-23 DIAGNOSIS — E785 Hyperlipidemia, unspecified: Secondary | ICD-10-CM | POA: Diagnosis not present

## 2015-05-23 DIAGNOSIS — I255 Ischemic cardiomyopathy: Secondary | ICD-10-CM

## 2015-05-23 DIAGNOSIS — I480 Paroxysmal atrial fibrillation: Secondary | ICD-10-CM

## 2015-05-23 DIAGNOSIS — I2510 Atherosclerotic heart disease of native coronary artery without angina pectoris: Secondary | ICD-10-CM

## 2015-05-23 DIAGNOSIS — Z7901 Long term (current) use of anticoagulants: Secondary | ICD-10-CM

## 2015-05-23 DIAGNOSIS — I4891 Unspecified atrial fibrillation: Secondary | ICD-10-CM

## 2015-05-23 DIAGNOSIS — I493 Ventricular premature depolarization: Secondary | ICD-10-CM

## 2015-05-23 LAB — POCT INR: INR: 2.7

## 2015-05-23 NOTE — Patient Instructions (Signed)
Your physician recommends that you return for lab work.  Your physician wants you to follow-up in: 6 months or sooner if needed. You will receive a reminder letter in the mail two months in advance. If you don't receive a letter, please call our office to schedule the follow-up appointment.  If you need a refill on your cardiac medications before your next appointment, please call your pharmacy.    

## 2015-05-23 NOTE — Progress Notes (Signed)
Patient ID: [name], male   DOB: 04/15/1940, 75 y.o.   MRN: [id number]     HPI: [name] is a 75 y.o. male who presents to the office for a 10 month cardiology follow up evaluation and following his recent hospitalization  [name] has a history of an ischemic cardiomyopathy secondary to suffering a large anterior wall myocardial infarction in 1992. In September 1998 he underwent CABG revascularization surgery after an unsuccessful attempt at stenting of his proximal LAD by [name]. Ejection fraction was 25-30%. In 2002 he underwent initial ICD implantation for nonsustained ventricular tachycardia documented on event monitor for primary prevention. In January 2010 he underwent generator change with a  Medtronic Virtuoso single chamber cardioverter defibrillator. Additional problems include paroxysmal atrial fibrillation, occasional PVCs and an attempt is made in the past to overdrive suppress his PVCs with reducing his beta blocker therapy.  He felt he had PVCs on the reduced dose and therefore has been on the higher dose.   A nuclear perfusion study  in November 2013 showed a large area of scar in the LAD territory (extent 44%) involving the mid to apical anterior,  apical and infero-apical to mid infero-septal and apical lateral wall without associated ischemia.  An echo Doppler study  on 01/01/2013 revealed a mildly dilated LV. Ejection fraction was 25-30%. There was dyskinesis in scarring of the apical myocardium as well as akinesis of the mid anteroseptal, anterior and anterolateral walls consistent with his prior LAD infarction. He does have a history of paroxysmal atrial fibrillation.   He  has a history of mild asthma for which he takes Singulair 10 mg in addition to Asmanex inhaler.  He has a history of hyperlipidemia and is tolerating Zocor 40 mg.  There is a remote history of gout, which he has been on allopurinol at low dose 150 mg daily without recurrence.  He is on Coumadin  anticoagulation and denies bleeding is unaware of anginal symptoms.   When I  saw him in January 2016, he had a optivol readings which were elevated from January 22 through February 22.  He had some intermittent atrial fibrillation during this time and also noted some intermittent shortness of breath, which he felt may be due to allergies.    When I saw him in March, I recommended further titration of his spironolactone to 12.5 mg twice a day and he was on a carvedilol dose that had been titrated up to 18.75 mg twice a day.  A follow-up echo Doppler study of 04/14/2014 showed an EF of 20-25% with akinesis and scarring of his LAD territory consistent with his old MI.  There was grade 2 diastolic dysfunction.  There is trivial AR and moderate Jason; moderate to severe dilation of his both atrium and mild reduction of RV pressure.   He was readmitted to the hospital from a May 2 through 06/04/2014 with recurrent atrial fibrillation.  During that hospitalization, he was started on Tikosyn 500 mg twice a day by Dr. [name].  His dose of carvedilol was reduced to 9.375 g twice a day.  Since I last saw him, he has been enrolled in the genetic AF trial and stopped Tikosyn required for initiation of the study.  He is now on study drug which is either Bucindolol 50 mg twice a day or metoprolol XL 200 mg daily.  He believes that his AF frequency and duration are slightly last typically has a short episode of AF lasting for several  hours, almost every 2 weeks.  He is followed in the AF clinic.  He denies recurrent anginal symptoms.  He states his blood pressure has been running between 99 and 476 systolically at home.  He is sleeping well.  He denies daytime sleepiness.  He is unaware of snoring.  He presents for evaluation.    Past Medical History  Diagnosis Date  . Coronary artery disease     Hx MI 1992, CABG 1998 , Nuc study 11.2013 large scar but no ischemia  . Automatic implantable cardiac defibrillator in situ  2002; 2010    medtronic virtuso  . Paroxysmal atrial fibrillation (HCC)   . GERD (gastroesophageal reflux disease)   . H. pylori infection     Hx of   . Cardiomyopathy, ischemic 2011    with EF 25-35% by echo  . H/O myocardial infarction, greater than 8 weeks 1992    large ant wall injury  . NSVT (nonsustained ventricular tachycardia) [facility])     Past Surgical History  Procedure Laterality Date  . Nasal sinus surgery  05/31/2010    Dr. Benjamine Mola  . Coronary artery bypass graft  1998    x 5  . Cataract extraction, bilateral    . Knee surgery      right  . Hernia repair    . Icd generator change  01/2008    medtronic, hx+ EP study    Allergies  Allergen Reactions  . Amoxicillin Rash    unknown  . Penicillins Rash    unknown    Current Outpatient Prescriptions  Medication Sig Dispense Refill  . acetaminophen (TYLENOL) 650 MG CR tablet Take 650 mg by mouth 2 (two) times daily.     . AMBULATORY NON FORMULARY MEDICATION Take 1 tablet by mouth 2 (two) times daily. Medication Name: GENETIC-AF study drug Metoprolol XL 200 mg QD vs Bucindolol 50 mg BID    . aspirin 81 MG chewable tablet Chew 1 tablet (81 mg total) by mouth on Monday, Wednesday, & Friday. 12 tablet 11  . cetirizine (ZYRTEC) 10 MG tablet Take 10 mg by mouth daily.      . Cholecalciferol (VITAMIN D) 2000 UNITS CAPS Take 1 capsule by mouth daily.    Marland Kitchen dicyclomine (BENTYL) 10 MG/5ML syrup Take by mouth 2 (two) times daily as needed.    . furosemide (LASIX) 40 MG tablet Take 1 tablet (40 mg total) by mouth daily. 30 tablet 6  . glycopyrrolate (ROBINUL) 2 MG tablet Take 2 mg by mouth 2 (two) times daily as needed (stomach).    . isosorbide mononitrate (IMDUR) 30 MG 24 hr tablet Take 15 mg by mouth daily.    Marland Kitchen lisinopril (PRINIVIL,ZESTRIL) 5 MG tablet Take 1 tablet (5 mg total) by mouth at bedtime. 30 tablet 1  . magnesium oxide (MAG-OX) 400 MG tablet Take 400 mg by mouth daily.    . mometasone (ASMANEX) 220 MCG/INH inhaler  Inhale 1 puff into the lungs daily.     . mometasone (NASONEX) 50 MCG/ACT nasal spray Place 2 sprays into the nose daily as needed (allergies).     . montelukast (SINGULAIR) 10 MG tablet Take 1 tablet (10 mg total) by mouth at bedtime. PLEASE SCHEDULE APPOINTMENT. 30 tablet 0  . nitroGLYCERIN (NITROSTAT) 0.4 MG SL tablet Place 1 tablet (0.4 mg total) under the tongue every 5 (five) minutes as needed for chest pain. 25 tablet 1  . omeprazole (PRILOSEC) 20 MG capsule Take 1 capsule (20 mg total) by mouth  2 (two) times daily before a meal. 180 capsule 3  . silodosin (RAPAFLO) 4 MG CAPS capsule Take 8 mg by mouth. Every 2 days    . simvastatin (ZOCOR) 40 MG tablet TAKE ONE TABLET BY MOUTH AT BEDTIME 90 tablet 3  . spironolactone (ALDACTONE) 25 MG tablet Take 0.5 tablets (12.5 mg total) by mouth daily. 30 tablet 11  . warfarin (COUMADIN) 5 MG tablet Take 1 tablet by mouth daily or as directed by coumadin clinic 90 tablet 0   No current facility-administered medications for this visit.    Socially he is married and has 3 children and 5 grandchildren. He does exercise most recently with walking. He used to play tennis. There is no tobacco or alcohol use.  ROS General: Negative; No fevers, chills, or night sweats;  HEENT: Negative; No changes in vision or hearing, sinus congestion, difficulty swallowing Pulmonary:  Positive for mild asthma; No cough, wheezing, shortness of breath, hemoptysis Cardiovascular: see history of present illness  GI: History of GERD No nausea, vomiting, diarrhea, or abdominal pain GU: Negative; No dysuria, hematuria, or difficulty voiding Musculoskeletal: Negative; no myalgias, joint pain, or weakness Hematologic/Oncology: Negative; no easy bruising, bleeding Endocrine: Negative; no heat/cold intolerance; no diabetes Neuro: Negative; no changes in balance, headaches Skin: Negative; No rashes or skin lesions Psychiatric: Negative; No behavioral problems, depression Sleep:  Negative; No snoring, daytime sleepiness, hypersomnolence, bruxism, restless legs, hypnogognic hallucinations, no cataplexy Other comprehensive 14 point system review is negative.   PE BP 99/59 mmHg  Pulse 64  Ht '5\' 7"'$  (1.702 m)  Wt 150 lb (68.04 kg)  BMI 23.49 kg/m2   Repeat blood pressure by me 112/68  Wt Readings from Last 3 Encounters:  05/23/15 150 lb (68.04 kg)  05/11/15 151 lb (68.493 kg)  04/07/15 150 lb 12.8 oz (68.402 kg)  . General: Alert, oriented, no distress.  Skin: normal turgor, no rashes HEENT: Normocephalic, atraumatic. Pupils round and reactive; sclera anicteric;no lid lag,  Nose without nasal septal hypertrophy Mouth/Parynx benign; Mallinpatti scale 2 Neck: No JVD, no carotid bruits; normal carotid upstroke Chest wall: No tenderness to palpation Lungs: clear to ausculatation and percussion; no wheezing or rales Heart: RRR, s1 s2 normal 2/6 systolic murmur.  No diastolic murmur No S3 gallop. No heave or parasternal lift. No rub. Abdomen: soft, nontender; no hepatosplenomehaly, BS+; abdominal aorta nontender and not dilated by palpation. Back: No CVA tenderness Pulses 2+ Extremities: no clubbing cyanosis or edema, Homan's sign negative  Neurologic: grossly nonfocal; cranial nerves normal Psychological: Normal affect and mood.  ECG (independently read by me): Normal sinus rhythm with a PR interval of 174 ms.  QTc interval 466 ms.  Nonspecific interventricular block.  Nonspecific T-wave abnormalities.  2.  PVCs.  June 2016 ECG (independently read by me): Sinus bradycardia 57.  Nonspecific interventricular block with a QRS duration of 1:30 milliseconds.  Old anterolateral MI.  PR interval 154 ms, QTc interval 517 ms.  March 2016 ECG (independently read by me): Sinus rhythm with PACs.  Old anterior wall MI.  Left axis deviation.  Probable left atrial enlargement.  02/09/2014 ECG (independently read by me):  normal sinus rhythm with sinus arrhythmia at 63 bpm.   One isolated PVC.  10/06/2013 (independently read by me): Sinus rhythm at 56 beats per minute with occasional isolated PVC.  06/01/2013 ECG (independently read by me): Sinus bradycardia at 49 beats per minute.  No ectopy.  QTc interval 45 ms.  Prior ECG (independently read by me): Sinus  bradycardia 52 beats per minute. Old anterior lateral myocardial infarction. Nonspecific interventricular conduction delay. Previously noted T-wave changes.  ECG from 08/28/2012: Sinus bradycardia 52 beats per minute. No ectopy. Diffuse T wave abnormality the 4 through V6 1 and L. Poor R-wave progression compatible with prior anterior wall MI.  LABS:  BMP Latest Ref Rng 05/10/2015 04/07/2015 02/03/2015  Glucose 65 - 99 mg/dL 78 82 103(H)  BUN 7 - 25 mg/dL _0 Creatinine 0.70 - 1.18 mg/dL 0.79 0.89 1.00  Sodium 135 - 146 mmol/L 139 136 137  Potassium 3.5 - 5.3 mmol/L 4.3 4.2 4.8  Chloride 98 - 110 mmol/L 103 102 101  CO2 20 - 31 mmol/L _1 Calcium 8.6 - 10.3 mg/dL 9.3 9.1 9.3       Component Value Date/Time   PROT 6.9 08/31/2014 0822   ALBUMIN 3.9 08/31/2014 0822   AST 19 08/31/2014 0822   ALT 15 08/31/2014 0822   ALKPHOS 48 08/31/2014 0822   BILITOT 0.7 08/31/2014 0822   BILIDIR 0.2 11/07/2012 1509    Hepatic Function Latest Ref Rng 08/31/2014 06/01/2013 02/17/2013  Total Protein 6.0 - 8.3 g/dL 6.9 6.4 6.9  Albumin 3.5 - 5.2 g/dL 3.9 4.0 4.1  AST 0 - 37 U/L _2 ALT 0 - 53 U/L _3 Alk Phosphatase 39 - 117 U/L 48 45 45  Total Bilirubin 0.2 - 1.2 mg/dL 0.7 0.6 0.7  Bilirubin, Direct 0.0 - 0.3 mg/dL - - -     CBC Latest Ref Rng 06/01/2014 05/31/2014 06/01/2013  WBC 4.0 - 10.5 K/uL 10.4 12.0(H) 7.7  Hemoglobin 13.0 - 17.0 g/dL 14.5 15.5 14.3  Hematocrit 39.0 - 52.0 % 45.4 45.9 42.7  Platelets 150 - 400 K/uL 232 231 303   Lab Results  Component Value Date   MCV 86.3 06/01/2014   MCV 84.7 05/31/2014   MCV 83.4 06/01/2013   Lab Results  Component Value Date   TSH 3.152  06/01/2013    BNP No results found for: PROBNP    Lipid Panel     Component Value Date/Time   CHOL 141 08/31/2014 0822   TRIG 132.0 08/31/2014 0822   HDL 32.90* 08/31/2014 0822   CHOLHDL 4 08/31/2014 0822   VLDL 26.4 08/31/2014 0822   LDLCALC 82 08/31/2014 0822    RADIOLOGY: No results found.    ASSESSMENT AND PLAN:  [name]  is a 75 year old white male who has a history of a nonischemic cardiomyopathy secondary to suffering a a large anterior wall myocardial infarction.  His most recent echo Doppler study from March 2016 revealed an EF in the 2825% range.  His last nuclear perfusion study in November 2013 was nonischemic.   He has had issues with paroxysmal atrial fibrillation.  This did improve with resumption of amiodarone but he had been taken off this by Dr. [name].  With his recent hospitalization, he was started on tikosyn for recurrent AF.  Subsequently, he enrolled in the genetic AF study and is now on study drug which is either bisoprolol 50 mg twice a day or metoprolol XL 20 mg daily.  Clinically, he feels well and subjectively believes he is having less AF.  Presently, there are no signs of overt CHF.  He has been undergoing occasional optimal testing.  Clinically, he appears well compensated on study drug, furosemide 40 mg daily, isosorbide 15 mg daily, spironolactone 12.5 mg.  He is on Coumadin anticoagulation.  He continues  to be on simvastatin 40 mg for hyperlipidemia.  LDL last year was 82.  Several weeks.  I have suggested complete set of follow-up laboratory on his current medical regimen.  He will be seen in the AF clinic on a monthly basis.  I will see him in 6 months for cardiology reevaluation.  Time spent: 25 minutes   Troy Sine, MD, [facility]  05/23/2015 6:58 PM

## 2015-05-31 DIAGNOSIS — I4891 Unspecified atrial fibrillation: Secondary | ICD-10-CM | POA: Diagnosis not present

## 2015-05-31 DIAGNOSIS — E785 Hyperlipidemia, unspecified: Secondary | ICD-10-CM | POA: Diagnosis not present

## 2015-05-31 DIAGNOSIS — I480 Paroxysmal atrial fibrillation: Secondary | ICD-10-CM | POA: Diagnosis not present

## 2015-05-31 LAB — CBC
HCT: 45.4 % (ref 38.5–50.0)
HEMOGLOBIN: 14.9 g/dL (ref 13.2–17.1)
MCH: 28 pg (ref 27.0–33.0)
MCHC: 32.8 g/dL (ref 32.0–36.0)
MCV: 85.3 fL (ref 80.0–100.0)
MPV: 11.4 fL (ref 7.5–12.5)
PLATELETS: 220 10*3/uL (ref 140–400)
RBC: 5.32 MIL/uL (ref 4.20–5.80)
RDW: 13.8 % (ref 11.0–15.0)
WBC: 8 10*3/uL (ref 3.8–10.8)

## 2015-06-01 LAB — COMPREHENSIVE METABOLIC PANEL
ALT: 15 U/L (ref 9–46)
AST: 21 U/L (ref 10–35)
Albumin: 4 g/dL (ref 3.6–5.1)
Alkaline Phosphatase: 52 U/L (ref 40–115)
BILIRUBIN TOTAL: 1 mg/dL (ref 0.2–1.2)
BUN: 12 mg/dL (ref 7–25)
CO2: 26 mmol/L (ref 20–31)
CREATININE: 0.91 mg/dL (ref 0.70–1.18)
Calcium: 9.2 mg/dL (ref 8.6–10.3)
Chloride: 103 mmol/L (ref 98–110)
GLUCOSE: 90 mg/dL (ref 65–99)
Potassium: 4.6 mmol/L (ref 3.5–5.3)
SODIUM: 136 mmol/L (ref 135–146)
Total Protein: 6.3 g/dL (ref 6.1–8.1)

## 2015-06-01 LAB — LIPID PANEL
Cholesterol: 125 mg/dL (ref 125–200)
HDL: 34 mg/dL — ABNORMAL LOW (ref >=40)
LDL CALC: 70 mg/dL (ref <130)
TRIGLYCERIDES: 107 mg/dL (ref <150)
Total CHOL/HDL Ratio: 3.7 Ratio (ref <=5.0)
VLDL: 21 mg/dL (ref <30)

## 2015-06-01 LAB — TSH: TSH: 2.43 mIU/L (ref 0.40–4.50)

## 2015-06-01 LAB — MAGNESIUM: MAGNESIUM: 2.2 mg/dL (ref 1.5–2.5)

## 2015-06-02 ENCOUNTER — Telehealth: Payer: Self-pay | Admitting: *Deleted

## 2015-06-02 NOTE — Telephone Encounter (Signed)
Called [name] for his follow-up for GENETIC-AF study. He is doing well and has had no new events

## 2015-06-04 DIAGNOSIS — R069 Unspecified abnormalities of breathing: Secondary | ICD-10-CM | POA: Diagnosis not present

## 2015-06-08 ENCOUNTER — Telehealth: Payer: Self-pay | Admitting: Gastroenterology

## 2015-06-08 NOTE — Telephone Encounter (Signed)
Is this a new type of pain for him? Is he vomiting, or tolerating PO? Given his nausea and history of IBS, a trial of zofran every 8 hours PRN may be reasonable if he thinks it is IBS type pain. If he isn't able to eat or tolerate PO, he will need to go to the ER. If this is a very new type of pain for him or severe let me know, we may consider lab workup. Thanks

## 2015-06-08 NOTE — Telephone Encounter (Signed)
Patient states he has had upper abdominal pain for the last few days. States he has not been able to eat. Denies fever, constipation or diarrhea. He does report some nausea. He has taken his Omeprazole and Dicyclomine. 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 He has not tried the Robinul he has. He will try this instead of the Dicyclomine and see if it works better. Scheduled OV with Alonza Bogus, PA on 06/16/15.(next available OV)

## 2015-06-09 ENCOUNTER — Inpatient Hospital Stay (HOSPITAL_COMMUNITY): Payer: Medicare Other

## 2015-06-09 ENCOUNTER — Emergency Department (HOSPITAL_COMMUNITY): Payer: Medicare Other

## 2015-06-09 ENCOUNTER — Telehealth: Payer: Self-pay

## 2015-06-09 ENCOUNTER — Inpatient Hospital Stay (HOSPITAL_COMMUNITY)
Admission: EM | Admit: 2015-06-09 | Discharge: 2015-06-14 | DRG: 291 | Disposition: A | Discharge status: Home Health | Payer: Medicare Other | Attending: Internal Medicine | Admitting: Internal Medicine

## 2015-06-09 ENCOUNTER — Encounter (HOSPITAL_COMMUNITY): Payer: Self-pay | Admitting: *Deleted

## 2015-06-09 ENCOUNTER — Ambulatory Visit (INDEPENDENT_AMBULATORY_CARE_PROVIDER_SITE_OTHER): Payer: Medicare Other

## 2015-06-09 DIAGNOSIS — K219 Gastro-esophageal reflux disease without esophagitis: Secondary | ICD-10-CM | POA: Diagnosis present

## 2015-06-09 DIAGNOSIS — Z79899 Other long term (current) drug therapy: Secondary | ICD-10-CM | POA: Diagnosis not present

## 2015-06-09 DIAGNOSIS — E785 Hyperlipidemia, unspecified: Secondary | ICD-10-CM | POA: Diagnosis not present

## 2015-06-09 DIAGNOSIS — K625 Hemorrhage of anus and rectum: Secondary | ICD-10-CM | POA: Diagnosis present

## 2015-06-09 DIAGNOSIS — Z8679 Personal history of other diseases of the circulatory system: Secondary | ICD-10-CM

## 2015-06-09 DIAGNOSIS — K811 Chronic cholecystitis: Secondary | ICD-10-CM | POA: Diagnosis present

## 2015-06-09 DIAGNOSIS — M11861 Other specified crystal arthropathies, right knee: Secondary | ICD-10-CM | POA: Diagnosis not present

## 2015-06-09 DIAGNOSIS — J45909 Unspecified asthma, uncomplicated: Secondary | ICD-10-CM | POA: Diagnosis present

## 2015-06-09 DIAGNOSIS — I472 Ventricular tachycardia: Secondary | ICD-10-CM | POA: Diagnosis not present

## 2015-06-09 DIAGNOSIS — K810 Acute cholecystitis: Secondary | ICD-10-CM | POA: Diagnosis present

## 2015-06-09 DIAGNOSIS — I255 Ischemic cardiomyopathy: Secondary | ICD-10-CM | POA: Diagnosis present

## 2015-06-09 DIAGNOSIS — I5043 Acute on chronic combined systolic (congestive) and diastolic (congestive) heart failure: Secondary | ICD-10-CM | POA: Diagnosis not present

## 2015-06-09 DIAGNOSIS — I2510 Atherosclerotic heart disease of native coronary artery without angina pectoris: Secondary | ICD-10-CM | POA: Diagnosis not present

## 2015-06-09 DIAGNOSIS — I252 Old myocardial infarction: Secondary | ICD-10-CM

## 2015-06-09 DIAGNOSIS — I5023 Acute on chronic systolic (congestive) heart failure: Secondary | ICD-10-CM | POA: Diagnosis present

## 2015-06-09 DIAGNOSIS — I5022 Chronic systolic (congestive) heart failure: Secondary | ICD-10-CM

## 2015-06-09 DIAGNOSIS — Z7982 Long term (current) use of aspirin: Secondary | ICD-10-CM | POA: Diagnosis not present

## 2015-06-09 DIAGNOSIS — R05 Cough: Secondary | ICD-10-CM | POA: Diagnosis present

## 2015-06-09 DIAGNOSIS — M25061 Hemarthrosis, right knee: Secondary | ICD-10-CM | POA: Diagnosis present

## 2015-06-09 DIAGNOSIS — E876 Hypokalemia: Secondary | ICD-10-CM | POA: Insufficient documentation

## 2015-06-09 DIAGNOSIS — K819 Cholecystitis, unspecified: Secondary | ICD-10-CM | POA: Diagnosis not present

## 2015-06-09 DIAGNOSIS — Z951 Presence of aortocoronary bypass graft: Secondary | ICD-10-CM

## 2015-06-09 DIAGNOSIS — I447 Left bundle-branch block, unspecified: Secondary | ICD-10-CM | POA: Diagnosis present

## 2015-06-09 DIAGNOSIS — J069 Acute upper respiratory infection, unspecified: Secondary | ICD-10-CM

## 2015-06-09 DIAGNOSIS — I25118 Atherosclerotic heart disease of native coronary artery with other forms of angina pectoris: Secondary | ICD-10-CM | POA: Diagnosis not present

## 2015-06-09 DIAGNOSIS — K5732 Diverticulitis of large intestine without perforation or abscess without bleeding: Secondary | ICD-10-CM | POA: Diagnosis present

## 2015-06-09 DIAGNOSIS — M1711 Unilateral primary osteoarthritis, right knee: Secondary | ICD-10-CM | POA: Diagnosis present

## 2015-06-09 DIAGNOSIS — Z880 Allergy status to penicillin: Secondary | ICD-10-CM | POA: Diagnosis not present

## 2015-06-09 DIAGNOSIS — R0602 Shortness of breath: Secondary | ICD-10-CM | POA: Diagnosis not present

## 2015-06-09 DIAGNOSIS — R1011 Right upper quadrant pain: Secondary | ICD-10-CM | POA: Diagnosis present

## 2015-06-09 DIAGNOSIS — I480 Paroxysmal atrial fibrillation: Secondary | ICD-10-CM

## 2015-06-09 DIAGNOSIS — G8929 Other chronic pain: Secondary | ICD-10-CM | POA: Diagnosis present

## 2015-06-09 DIAGNOSIS — Z95810 Presence of automatic (implantable) cardiac defibrillator: Secondary | ICD-10-CM

## 2015-06-09 DIAGNOSIS — M109 Gout, unspecified: Secondary | ICD-10-CM | POA: Diagnosis present

## 2015-06-09 DIAGNOSIS — J189 Pneumonia, unspecified organism: Secondary | ICD-10-CM | POA: Diagnosis present

## 2015-06-09 DIAGNOSIS — R1013 Epigastric pain: Secondary | ICD-10-CM

## 2015-06-09 DIAGNOSIS — K648 Other hemorrhoids: Secondary | ICD-10-CM | POA: Diagnosis not present

## 2015-06-09 DIAGNOSIS — K828 Other specified diseases of gallbladder: Secondary | ICD-10-CM | POA: Diagnosis not present

## 2015-06-09 DIAGNOSIS — Z7901 Long term (current) use of anticoagulants: Secondary | ICD-10-CM | POA: Diagnosis not present

## 2015-06-09 DIAGNOSIS — M11269 Other chondrocalcinosis, unspecified knee: Secondary | ICD-10-CM | POA: Insufficient documentation

## 2015-06-09 DIAGNOSIS — I959 Hypotension, unspecified: Secondary | ICD-10-CM | POA: Diagnosis not present

## 2015-06-09 DIAGNOSIS — R058 Other specified cough: Secondary | ICD-10-CM | POA: Diagnosis present

## 2015-06-09 DIAGNOSIS — R1012 Left upper quadrant pain: Secondary | ICD-10-CM | POA: Diagnosis not present

## 2015-06-09 DIAGNOSIS — R1084 Generalized abdominal pain: Secondary | ICD-10-CM | POA: Diagnosis not present

## 2015-06-09 DIAGNOSIS — I4729 Other ventricular tachycardia: Secondary | ICD-10-CM | POA: Insufficient documentation

## 2015-06-09 DIAGNOSIS — I340 Nonrheumatic mitral (valve) insufficiency: Secondary | ICD-10-CM | POA: Diagnosis present

## 2015-06-09 DIAGNOSIS — R52 Pain, unspecified: Secondary | ICD-10-CM

## 2015-06-09 HISTORY — DX: Acute on chronic systolic (congestive) heart failure: I50.23

## 2015-06-09 LAB — CBC
HEMATOCRIT: 43.5 % (ref 39.0–52.0)
Hemoglobin: 14 g/dL (ref 13.0–17.0)
MCH: 27.7 pg (ref 26.0–34.0)
MCHC: 32.2 g/dL (ref 30.0–36.0)
MCV: 86.1 fL (ref 78.0–100.0)
Platelets: 234 10*3/uL (ref 150–400)
RBC: 5.05 MIL/uL (ref 4.22–5.81)
RDW: 13.9 % (ref 11.5–15.5)
WBC: 8.5 10*3/uL (ref 4.0–10.5)

## 2015-06-09 LAB — COMPREHENSIVE METABOLIC PANEL
ALT: 22 U/L (ref 17–63)
ANION GAP: 11 (ref 5–15)
AST: 23 U/L (ref 15–41)
Albumin: 3.7 g/dL (ref 3.5–5.0)
Alkaline Phosphatase: 59 U/L (ref 38–126)
BILIRUBIN TOTAL: 1.3 mg/dL — AB (ref 0.3–1.2)
BUN: 13 mg/dL (ref 6–20)
CO2: 25 mmol/L (ref 22–32)
Calcium: 9.4 mg/dL (ref 8.9–10.3)
Chloride: 101 mmol/L (ref 101–111)
Creatinine, Ser: 0.91 mg/dL (ref 0.61–1.24)
GFR calc Af Amer: >60 mL/min (ref >60)
Glucose, Bld: 96 mg/dL (ref 65–99)
Potassium: 4.3 mmol/L (ref 3.5–5.1)
Sodium: 137 mmol/L (ref 135–145)
TOTAL PROTEIN: 6.4 g/dL — AB (ref 6.5–8.1)

## 2015-06-09 LAB — URINE MICROSCOPIC-ADD ON
BACTERIA UA: NONE SEEN
RBC / HPF: NONE SEEN RBC/hpf (ref 0–5)

## 2015-06-09 LAB — URINALYSIS, ROUTINE W REFLEX MICROSCOPIC
Bilirubin Urine: NEGATIVE
Glucose, UA: NEGATIVE mg/dL
Hgb urine dipstick: NEGATIVE
KETONES UR: 15 mg/dL — AB
NITRITE: NEGATIVE
PH: 6.5 (ref 5.0–8.0)
PROTEIN: NEGATIVE mg/dL
Specific Gravity, Urine: 1.015 (ref 1.005–1.030)

## 2015-06-09 LAB — TYPE AND SCREEN
ABO/RH(D): O POS
Antibody Screen: NEGATIVE

## 2015-06-09 LAB — PROCALCITONIN

## 2015-06-09 LAB — LIPASE, BLOOD: Lipase: 33 U/L (ref 11–51)

## 2015-06-09 LAB — INFLUENZA PANEL BY PCR (TYPE A & B)
H1N1 flu by pcr: NOT DETECTED
INFLAPCR: NEGATIVE
INFLBPCR: NEGATIVE

## 2015-06-09 LAB — I-STAT TROPONIN, ED: Troponin i, poc: 0.01 ng/mL (ref 0.00–0.08)

## 2015-06-09 LAB — BRAIN NATRIURETIC PEPTIDE: B NATRIURETIC PEPTIDE 5: 529.9 pg/mL — AB (ref 0.0–100.0)

## 2015-06-09 LAB — PROTIME-INR
INR: 2.82 — ABNORMAL HIGH (ref 0.00–1.49)
Prothrombin Time: 29.2 seconds — ABNORMAL HIGH (ref 11.6–15.2)

## 2015-06-09 LAB — POC OCCULT BLOOD, ED: Fecal Occult Bld: POSITIVE — AB

## 2015-06-09 LAB — ABO/RH: ABO/RH(D): O POS

## 2015-06-09 MED ORDER — MONTELUKAST SODIUM 10 MG PO TABS
10.0000 mg | ORAL_TABLET | Freq: Every day | ORAL | Status: DC
  Administered 2015-06-09 – 2015-06-13 (×5): 10 mg via ORAL
  Filled 2015-06-09 (×5): qty 1

## 2015-06-09 MED ORDER — ONDANSETRON HCL 4 MG/2ML IJ SOLN: 4.0000 mg | Freq: Three times a day (TID) | INTRAMUSCULAR | Status: AC | PRN

## 2015-06-09 MED ORDER — DOXYCYCLINE HYCLATE 100 MG IV SOLR
100.0000 mg | Freq: Once | INTRAVENOUS | Status: AC
  Administered 2015-06-09: 100 mg via INTRAVENOUS
  Filled 2015-06-09: qty 100

## 2015-06-09 MED ORDER — HYDROMORPHONE HCL 1 MG/ML IJ SOLN: 1.0000 mg | INTRAMUSCULAR | Status: AC | PRN

## 2015-06-09 MED ORDER — ACETAMINOPHEN 650 MG RE SUPP
650.0000 mg | Freq: Four times a day (QID) | RECTAL | Status: DC | PRN
Start: 2015-06-09 — End: 2015-06-14

## 2015-06-09 MED ORDER — SODIUM CHLORIDE 0.9% FLUSH
3.0000 mL | Freq: Two times a day (BID) | INTRAVENOUS | Status: DC
  Administered 2015-06-09 – 2015-06-13 (×7): 3 mL via INTRAVENOUS

## 2015-06-09 MED ORDER — MOMETASONE FUROATE 220 MCG/INH IN AEPB: 1.0000 | INHALATION_SPRAY | Freq: Every day | RESPIRATORY_TRACT | Status: DC

## 2015-06-09 MED ORDER — ISOSORBIDE MONONITRATE ER 30 MG PO TB24
15.0000 mg | ORAL_TABLET | Freq: Every day | ORAL | Status: DC
  Administered 2015-06-09 – 2015-06-13 (×5): 15 mg via ORAL
  Filled 2015-06-09 (×5): qty 1

## 2015-06-09 MED ORDER — MORPHINE SULFATE (PF) 2 MG/ML IV SOLN: 1.0000 mg | INTRAVENOUS | Status: DC | PRN

## 2015-06-09 MED ORDER — ALBUTEROL SULFATE (2.5 MG/3ML) 0.083% IN NEBU: 5.0000 mg | INHALATION_SOLUTION | RESPIRATORY_TRACT | Status: AC | PRN

## 2015-06-09 MED ORDER — ASPIRIN 81 MG PO CHEW
81.0000 mg | CHEWABLE_TABLET | Freq: Every day | ORAL | Status: DC
  Administered 2015-06-10 – 2015-06-14 (×5): 81 mg via ORAL
  Filled 2015-06-09 (×6): qty 1

## 2015-06-09 MED ORDER — PANTOPRAZOLE SODIUM 40 MG PO TBEC
40.0000 mg | DELAYED_RELEASE_TABLET | Freq: Every day | ORAL | Status: DC
  Administered 2015-06-09 – 2015-06-14 (×6): 40 mg via ORAL
  Filled 2015-06-09 (×6): qty 1

## 2015-06-09 MED ORDER — FUROSEMIDE 20 MG PO TABS
40.0000 mg | ORAL_TABLET | Freq: Every day | ORAL | Status: DC
Start: 2015-06-09 — End: 2015-06-09
  Administered 2015-06-09: 40 mg via ORAL
  Filled 2015-06-09: qty 2

## 2015-06-09 MED ORDER — WARFARIN SODIUM 5 MG PO TABS
5.0000 mg | ORAL_TABLET | Freq: Once | ORAL | Status: AC
  Administered 2015-06-09: 5 mg via ORAL
  Filled 2015-06-09 (×2): qty 1

## 2015-06-09 MED ORDER — DEXTROSE 5 % IV SOLN
2.0000 g | INTRAVENOUS | Status: DC
  Administered 2015-06-10 – 2015-06-12 (×3): 2 g via INTRAVENOUS
  Filled 2015-06-09 (×5): qty 2

## 2015-06-09 MED ORDER — TAMSULOSIN HCL 0.4 MG PO CAPS
0.4000 mg | ORAL_CAPSULE | Freq: Every day | ORAL | Status: DC
  Administered 2015-06-10 – 2015-06-13 (×4): 0.4 mg via ORAL
  Filled 2015-06-09 (×5): qty 1

## 2015-06-09 MED ORDER — DEXTROSE 5 % IV SOLN
2.0000 g | Freq: Once | INTRAVENOUS | Status: AC
  Administered 2015-06-09: 2 g via INTRAVENOUS
  Filled 2015-06-09: qty 2

## 2015-06-09 MED ORDER — WARFARIN - PHARMACIST DOSING INPATIENT
Freq: Every day | Status: DC
Start: 2015-06-09 — End: 2015-06-14
  Administered 2015-06-11 – 2015-06-12 (×2)

## 2015-06-09 MED ORDER — SIMVASTATIN 40 MG PO TABS
40.0000 mg | ORAL_TABLET | Freq: Every day | ORAL | Status: DC
  Administered 2015-06-09 – 2015-06-13 (×5): 40 mg via ORAL
  Filled 2015-06-09 (×6): qty 1

## 2015-06-09 MED ORDER — PROCHLORPERAZINE EDISYLATE 5 MG/ML IJ SOLN: 5.0000 mg | INTRAMUSCULAR | Status: DC | PRN

## 2015-06-09 MED ORDER — MOMETASONE FUROATE 220 MCG/INH IN AEPB: 1.0000 | INHALATION_SPRAY | Freq: Two times a day (BID) | RESPIRATORY_TRACT | Status: DC

## 2015-06-09 MED ORDER — LISINOPRIL 5 MG PO TABS
5.0000 mg | ORAL_TABLET | Freq: Every day | ORAL | Status: DC
  Administered 2015-06-09 – 2015-06-13 (×5): 5 mg via ORAL
  Filled 2015-06-09 (×5): qty 1

## 2015-06-09 MED ORDER — NONFORMULARY OR COMPOUNDED ITEM
1.0000 | Freq: Two times a day (BID) | Status: DC
  Administered 2015-06-09 – 2015-06-14 (×11): 1 via ORAL
  Filled 2015-06-09 (×16): qty 1

## 2015-06-09 MED ORDER — FUROSEMIDE 10 MG/ML IJ SOLN
40.0000 mg | Freq: Two times a day (BID) | INTRAMUSCULAR | Status: AC
  Administered 2015-06-09 – 2015-06-10 (×2): 40 mg via INTRAVENOUS
  Filled 2015-06-09 (×2): qty 4

## 2015-06-09 MED ORDER — SPIRONOLACTONE 25 MG PO TABS
12.5000 mg | ORAL_TABLET | Freq: Every day | ORAL | Status: DC
  Administered 2015-06-09 – 2015-06-13 (×5): 12.5 mg via ORAL
  Filled 2015-06-09 (×5): qty 1

## 2015-06-09 MED ORDER — LEVOFLOXACIN 750 MG PO TABS: 750.0000 mg | ORAL_TABLET | Freq: Once | ORAL | Status: DC

## 2015-06-09 MED ORDER — TECHNETIUM TC 99M MEBROFENIN IV KIT
5.0000 | PACK | Freq: Once | INTRAVENOUS | Status: AC | PRN
  Administered 2015-06-09: 5 via INTRAVENOUS

## 2015-06-09 MED ORDER — MAGNESIUM OXIDE 400 (241.3 MG) MG PO TABS
400.0000 mg | ORAL_TABLET | Freq: Every day | ORAL | Status: DC
  Administered 2015-06-09 – 2015-06-14 (×6): 400 mg via ORAL
  Filled 2015-06-09 (×6): qty 1

## 2015-06-09 MED ORDER — ACETAMINOPHEN 325 MG PO TABS
650.0000 mg | ORAL_TABLET | Freq: Four times a day (QID) | ORAL | Status: DC | PRN
  Administered 2015-06-10 – 2015-06-11 (×3): 650 mg via ORAL
  Filled 2015-06-09 (×3): qty 2

## 2015-06-09 MED ORDER — BUDESONIDE 0.25 MG/2ML IN SUSP
0.2500 mg | Freq: Two times a day (BID) | RESPIRATORY_TRACT | Status: DC
  Filled 2015-06-09 (×5): qty 2

## 2015-06-09 NOTE — Telephone Encounter (Signed)
Received call from Berneda Rose, RN AF research.  She was calling to inform me that patient is in the hospital for SOB and abdominal pain.  She reported he sent the remote transmission before the EMS transported him from home.  Advised transmission shows he started retaining fluid yesterday.  She reported tests are being done to check if the gallbladder is the source of his abdominal pain.  Advised will reschedule remote transmission once patient is discharged from hospital if needed.

## 2015-06-09 NOTE — Progress Notes (Signed)
ANTICOAGULATION CONSULT NOTE - Initial Consult  Pharmacy Consult for warfarin Indication: atrial fibrillation  Allergies  Allergen Reactions  . Amoxicillin Rash  . Penicillins Rash    Patient Measurements: Height: 5\' 7"  (170.2 cm) Weight: 143 lb (64.864 kg) IBW/kg (Calculated) : 66.1  Vital Signs: Temp: 97.8 F (36.6 C) (05/11 0545) Temp Source: Oral (05/11 0545) BP: 118/84 mmHg (05/11 1230) Pulse Rate: 75 (05/11 1230)  Labs:  Recent Labs  06/09/15 0529  HGB 14.0  HCT 43.5  PLT 234  LABPROT 29.2*  INR 2.82*  CREATININE 0.91    Estimated Creatinine Clearance: 64.4 mL/min (by C-G formula based on Cr of 0.91).   Medical History: Past Medical History  Diagnosis Date  . Coronary artery disease     Hx MI 1992, CABG 1998 , Nuc study 11.2013 large scar but no ischemia  . 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 Paroxysmal atrial fibrillation (HCC)   . GERD (gastroesophageal reflux disease)   . H. pylori infection     Hx of   . Cardiomyopathy, ischemic 2011    with EF 25-35% by echo  . H/O myocardial infarction, greater than 8 weeks 1992    large ant wall injury  . NSVT (nonsustained ventricular tachycardia) (HCC)    Assessment: 66 yom presented to the ED with abdominal pain and SOB. HIDA scan was reported as negative so no surgical intervention planned at this time per surgery. FOB is positive but thought to be related to hemorrhoids so planning to resume coumadin. INR is therapeutic at 2.82 and CBC is WNL.   Goal of Therapy:  INR 2-3 Monitor platelets by anticoagulation protocol: Yes   Plan:  - Warfarin 5mg  PO x 1 tonight (normal home dose for Thursdays) - Daily INR  Kendry Pfarr, Rande Lawman 06/09/2015,12:50 PM

## 2015-06-09 NOTE — Progress Notes (Signed)
EPIC Encounter for ICM Monitoring  Patient Name: [name] is a 75 y.o. male Date: 06/09/2015 Primary Care Physican: [name], MD Primary Cardiologist: [name] Electrophysiologist: [name] Dry Weight: unknown   In the past month, have you:  1. Gained more than 2 pounds in a day or more than 5 pounds in a week? N/A  2. Had changes in your medications (with verification of current medications)? N/A  3. Had more shortness of breath than is usual for you? N/A  4. Limited your activity because of shortness of breath? N/A  5. Not been able to sleep because of shortness of breath? N/A  6. Had increased swelling in your feet or ankles? N/A  7. Had symptoms of dehydration (dizziness, dry mouth, increased thirst, decreased urine output) N/A  8. Had changes in sodium restriction? N/A  9. Been compliant with medication? N/A   ICM trend: 3 month view for 06/09/2015   ICM trend: 1 year view for 06/09/2015   Follow-up plan: ICM clinic phone appointment on  06/16/2015 (may need to reschedule if still hospitalized).  Patient currently hospitalized but transmission was sent before he left home at about 4 am.    Transmission reviewed.  Fluid index has been rising since 04/21/2015 and started > threshold on 05/21/2015.  Optivol thoracic impedance below reference line from 05/23/2015 to 06/02/2015 and 06/08/2015 to 06/09/2015 suggesting fluid accumulation.  Patient called EMS for SOB and abdominal pain about 4 am this am and currently admitted to the hospital.  He is being evaluated for gallbladder disease.      Copy of note sent to patient's primary care physician, primary cardiologist, and device following physician.  [name], RN, CCM 06/09/2015 10:40 AM

## 2015-06-09 NOTE — ED Notes (Signed)
Patient presents via EMS with c/o SOB and congestion for 5 days (slightly prod at times), epigastric discomfort for 2 days, bright red blood in stool last evening.  Has been up all night due to being anxious and concerned he was having another heart attack.

## 2015-06-09 NOTE — Progress Notes (Signed)
[name] [id number] Admission Data: 06/09/2015 6:06 PM Attending Provider: No att. providers found  [id number], Jason Saxon, MD Consults/ Treatment Team: Treatment Team:  Rounding Lbcardiology, MD  [name] is a 75 y.o. male patient admitted from ED awake, alert  & orientated  X 3,  Full Code, VSS - Blood pressure 100/57, pulse 67, temperature 98.3 F (36.8 C), temperature source Oral, resp. rate 18, height 5\' 7"  (1.702 m), weight 66.18 kg (145 lb 14.4 oz), SpO2 96 %., O2    2 L nasal cannular, no c/o shortness of breath, no c/o chest pain, no distress noted. Tele # [id number] Placed.   IV site WDL:  Left A/C Sl.  Allergies:   Allergies  Allergen Reactions  . Amoxicillin Rash  . Penicillins Rash     Past Medical History  Diagnosis Date  . Coronary artery disease     Hx MI 1992, CABG 1998 , Nuc study 11.2013 large scar but no ischemia  . 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 Paroxysmal atrial fibrillation (HCC)   . GERD (gastroesophageal reflux disease)   . H. pylori infection     Hx of   . Cardiomyopathy, ischemic 2011    with EF 25-35% by echo  . H/O myocardial infarction, greater than 8 weeks 1992    large ant wall injury  . NSVT (nonsustained ventricular tachycardia) ([name])   . Acute on chronic systolic CHF (congestive heart failure) ([place]) 06/09/2015      Pt orientation to unit, room and routine. Information packet given to patient/family and safety video watched.  Admission INP armband ID verified with patient/family, and in place. SR up x 2, fall risk assessment complete with Patient and family verbalizing understanding of risks associated with falls. Pt verbalizes an understanding of how to use the call bell and to call for help before getting out of bed.  Skin, clean-dry- intact without evidence of bruising, or skin tears.   No evidence of skin break down noted on exam.     Will cont to monitor and assist as  needed.  Jason Points, RN 06/09/2015 6:06 PM

## 2015-06-09 NOTE — ED Notes (Signed)
Lunch tray ordered for patient, heart healthy tray.

## 2015-06-09 NOTE — Consult Note (Addendum)
Admit date: 06/09/2015 Referring Physician  Dr. Marily Memos Primary Physician  Dr. [name] Primary Cardiologist  Dr. Shelva Majestic Reason for Consultation  SOB  HPI: Mr [name] is a 75yo male who has a history of an ischemic cardiomyopathy secondary to suffering a large anterior wall myocardial infarction in 1992. In September 1998 he underwent CABG revascularization surgery after an unsuccessful attempt at stenting of his proximal LAD by [name]. Ejection fraction was 25-30%. In 2002 he underwent initial ICD implantation for nonsustained ventricular tachycardia documented on event monitor for primary prevention. In January 2010 he underwent generator change with a Medtronic Virtuoso single chamber cardioverter defibrillator. Additional problems include paroxysmal atrial fibrillation, occasional PVCs and an attempt is made in the past to overdrive suppress his PVCs with reducing his beta blocker therapy. He felt he had PVCs on the reduced dose and therefore has been on the higher dose.   A nuclear perfusion study in November 2013 showed a large area of scar in the LAD territory (extent 44%) involving the mid to apical anterior, apical and infero-apical to mid infero-septal and apical lateral wall without associated ischemia.  An echo Doppler study on 01/01/2013 revealed a mildly dilated LV. Ejection fraction was 25-30%. There was dyskinesis in scarring of the apical myocardium as well as akinesis of the mid anteroseptal, anterior and anterolateral walls consistent with his prior LAD infarction. He does have a history of paroxysmal atrial fibrillation.   He has a history of mild asthma for which he takes Singulair 10 mg in addition to Asmanex inhaler. He has a history of hyperlipidemia and is tolerating Zocor 40 mg. There is a remote history of gout, which he has been on allopurinol at low dose 150 mg daily without recurrence. He is on Coumadin anticoagulation for PAF and denies bleeding  is unaware of anginal symptoms. He was readmitted to the hospital from a May 2 through 06/04/2014 with recurrent atrial fibrillation and started on Tikosyn.  He has been enrolled in the genetic AF trial and is now off Tikosyn and on the study drug of either Bucindolol or metoprolol high dose.  He was doing well at his last OV.    He presented to the ER today with complaints of SOB that is different from what he feels with his CHF and is associated with productive cough of yellow sputum with no fever or chills.  He also has had RUQ pain with postprandial bloating and abdominal pain.  He denies any chest pain or pressure, DOE, LE edema and has had a stable weight.  BNP was mildly elevated on presentation to the ER but chest xray did not demonstrate CHF but possibly pneumonitis.  He was found to have distended GB on [name] with diffuse GB wall thickening without cholelithiasis with acalculous cholecystitis and right pleural effusion.  He is being admitted to [facility] service but they have requested a Cardiology consult for further treatment of elevated BNP and possible preoperative cardiac clearance for cholecystectomy.     PMH:   Past Medical History  Diagnosis Date  . Coronary artery disease     Hx MI 1992, CABG 1998 , Nuc study 11.2013 large scar but no ischemia  . 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 Paroxysmal atrial fibrillation (HCC)   . GERD (gastroesophageal reflux disease)   . H. pylori infection     Hx of   . Cardiomyopathy, ischemic 2011    with EF  25-35% by echo  . H/O myocardial infarction, greater than 8 weeks 1992    large ant wall injury  . NSVT (nonsustained ventricular tachycardia) (HCC)      PSH:   Past Surgical History  Procedure Laterality Date  . Nasal sinus surgery  05/31/2010    Dr. Benjamine Mola  . Coronary artery bypass graft  1998    x 5  . Cataract extraction, bilateral    . Knee surgery      right  . Hernia repair  1960's     right inguinal  . Icd generator change  01/2008    medtronic, hx+ EP study    Allergies:  Amoxicillin and Penicillins Prior to Admit Meds:   (Not in a hospital admission) Fam HX:    Family History  Problem Relation Age of Onset  . Colon cancer Neg Hx   . Heart disease Father     questionable  . Stroke Mother   . Heart failure Sister   . Healthy Brother   . Healthy Sister   . Parkinsonism Brother    Social HX:    Social History   Social History  . Marital Status: Married    Spouse Name: N/A  . Number of Children: 3  . Years of Education: N/A   Occupational History  . slef employed semi retired    Social History Main Topics  . Smoking status: Never Smoker   . Smokeless tobacco: Never Used  . Alcohol Use: 1.2 oz/week    0 Standard drinks or equivalent, 2 Glasses of wine per week     Comment: socially  . Drug Use: No  . Sexual Activity: Not on file   Other Topics Concern  . Not on file   Social History Narrative     ROS:  All 11 ROS were addressed and are negative except what is stated in the HPI  Physical Exam: Blood pressure 112/70, pulse 77, temperature 97.8 F (36.6 C), temperature source Oral, resp. rate 18, height 5\' 7"  (1.702 m), weight 143 lb (64.864 kg), SpO2 100 %.    General: Well developed, well nourished, in no acute distress Head: Eyes PERRLA, No xanthomas.   Normal cephalic and atramatic  Lungs:   Crackles at bases bilaterally Heart:   HRRR S1 S2 Pulses are 2+ & equal.            No carotid bruit. No JVD.  No abdominal bruits. No femoral bruits. Abdomen: Bowel sounds are positive, abdomen soft and non-tender without masses  Extremities:   No clubbing, cyanosis or edema.  DP +1 Neuro: Alert and oriented X 3. Psych:  Good affect, responds appropriately    Labs:   Lab Results  Component Value Date   WBC 8.5 06/09/2015   HGB 14.0 06/09/2015   HCT 43.5 06/09/2015   MCV 86.1 06/09/2015   PLT 234 06/09/2015    Recent Labs Lab  06/09/15 0529  NA 137  K 4.3  CL 101  CO2 25  BUN 13  CREATININE 0.91  CALCIUM 9.4  PROT 6.4*  BILITOT 1.3*  ALKPHOS 59  ALT 22  AST 23  GLUCOSE 96   No results found for: PTT Lab Results  Component Value Date   INR 2.82* 06/09/2015   INR 2.7 05/23/2015   INR 3 05/02/2015   Lab Results  Component Value Date   TROPONINI 0.08* 06/01/2014     Lab Results  Component Value Date   CHOL 125 05/31/2015  CHOL 141 08/31/2014   CHOL 133 06/01/2014   Lab Results  Component Value Date   HDL 34* 05/31/2015   HDL 32.90* 08/31/2014   HDL 35* 06/01/2014   Lab Results  Component Value Date   LDLCALC 70 05/31/2015   LDLCALC 82 08/31/2014   LDLCALC 78 06/01/2014   Lab Results  Component Value Date   TRIG 107 05/31/2015   TRIG 132.0 08/31/2014   TRIG 100 06/01/2014   Lab Results  Component Value Date   CHOLHDL 3.7 05/31/2015   CHOLHDL 4 08/31/2014   CHOLHDL 3.8 06/01/2014   No results found for: LDLDIRECT    Radiology:  Dg Chest 2 View  06/09/2015  CLINICAL DATA:  Shortness of breath for few days. EXAM: CHEST  2 VIEW COMPARISON:  Chest radiograph Jun 22, 2014 FINDINGS: The cardiac silhouette appears moderately enlarged, status post median sternotomy for CABG. Single lead LEFT cardiac defibrillator in situ. Diffuse interstitial prominence with strandy densities in the lung bases, blunting of the RIGHT costophrenic angle is compatible pleural thickening. Increased lung volumes and flattened hemidiaphragms. No pneumothorax. Soft tissue planes and included osseous structures are nonsuspicious. IMPRESSION: Similar cardiomegaly. Diffuse interstitial prominence suggesting atypical infection in a background of COPD. Electronically Signed   By: Elon Alas M.D.   On: 06/09/2015 06:04   Nm Hepatobiliary Liver Func  06/09/2015  CLINICAL DATA:  Changes in the gallbladder suggestive of cholecystitis EXAM: NUCLEAR MEDICINE HEPATOBILIARY IMAGING TECHNIQUE: Sequential images of the  abdomen were obtained out to 60 minutes following intravenous administration of radiopharmaceutical. RADIOPHARMACEUTICALS:  5.0 mCi Tc-59m  Choletec IV COMPARISON:  Ultrasound from earlier in same day FINDINGS: Prompt uptake and biliary excretion of activity by the liver is seen. Gallbladder activity is visualized, consistent with patency of cystic duct. Biliary activity passes into small bowel, consistent with patent common bile duct. IMPRESSION: Normal uptake and excretion of biliary tracer. There is visualization of the gallbladder consistent with a patent cystic duct. No evidence of acute cholecystitis is noted. The changes on recent ultrasound could represent chronic cholecystitis, possible edema from congestive failure or other etiologies. Electronically Signed   By: Inez Catalina M.D.   On: 06/09/2015 10:14   US Abdomen Limited Ruq  06/09/2015  CLINICAL DATA:  Right upper quadrant pain for 3 days EXAM: US ABDOMEN LIMITED - RIGHT UPPER QUADRANT COMPARISON:  None. FINDINGS: Gallbladder: Well distended with diffuse gallbladder wall thickening to 6-7 mm. Edema is noted within the gallbladder wall. No gallstones are seen. No pericholecystic fluid is noted. Common bile duct: Diameter: 2.4 mm. Liver: A 1.4 cm echogenic lesion is noted within the right lobe of the liver likely representing a small hemangioma. Incidental note is made of small right pleural effusion. IMPRESSION: Well distended gallbladder with diffuse gallbladder wall thickening. No cholelithiasis is noted. In the appropriate clinical setting this is consistent with acalculous cholecystitis. Small right pleural effusion. Echogenic focus within the liver likely representing small hemangioma. Electronically Signed   By: Inez Catalina M.D.   On: 06/09/2015 07:17    EKG:  NSR with occasional ventricular bigeminy and LBBB  ASSESSMENT:PLAN:   1. Acute on chronic systolic CHF:  SOB in setting of acalculous cholecystitis with abdominal pain.  He has  been having episodes of abdominal distension after eating which may be resulting in decrease lung capacity and worsening his baseline DOE from his LV dysfunction.  He does have an elevated BNP on labs today and has crackles on lung exam.  Chest xray  shows diffuse interstitial prominence that was not read out as CHF but suspicious for mild acute on chronic systolic CHF exacerbation .  Will give Lasix 40mg  IV BID today and then reassess in am.  His weight has been stable at home and he has not had any LE edema.  He is very strict with following a 2gm sodium diet.   2.  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  If he needs cholecystectomy would recommend nuclear stress testing prior to surgery given recent increase in SOB.  He has not had an assessment for ischemic in some time.  Will await final decision from surgery.  3.  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  2D echo in 2016 with ? Apical thrombus.  He has been anticoagulated with coumadin as well for PAF.  Continue ACE I/aldactone.   4.  ASCAD s/p large QWMI 1992 with subsequent CABG in 1998 after unsuccessful PCI attempt at LAD.  Last nuclear stress test 2013 showed large anterior wall scar inolving the mid to apical anterior, apical and inferoapical and apical lateral walls with no ischemia.  Continue ASA/statin/long acting nitrates 5. NSVT s/p AICD 6. Dyslipidemia - continue statin 7. PAF maintaining NSR.  He is on chronic anticoagulation with coumadin.  If he needs surgery will hold coumadin and start IV heparin gtt once INR < 2.    I have spent a total of 35 minutes with patient reviewing old records , telemetry, EKGs, labs and examining patient as well as establishing an assessment and plan that was discussed with the patient.  > 50% of time was spent in direct patient care.    Sueanne Margarita, MD  06/09/2015  12:21 PM

## 2015-06-09 NOTE — H&P (Signed)
History and Physical    [name] [id number] DOB: 06/02/1940 DOA: 06/09/2015   PCP: [name], MD  Patient coming from/resides with: Private residence and lives with wife  Chief Complaint: Shortness of breath with productive cough and right upper quadrant abdominal pain with postprandial bloating  HPI: [name] is a 75 y.o. male with medical history significant for CABG in 1998, ischemic cardiomyopathy with chronic systolic heart failure EF 20-25% and moderate MR, history of ventricular tachycardia status post ICD, chronic epigastric abdominal pain and dyspepsia with underlying diagnosis of GERD as well as a history of irritable bowel syndrome, dyslipidemia, asthma on preventative medications, paroxysmal atrial fibrillation on warfarin, history of internal hemorrhoids. Patient has been having a constellation of symptoms beginning with shortness of breath not typical for his heart failure symptoms ongoing for about 2-3 weeks. He's been having productive cough with yellow sputum not associated with fevers chills or other typical infection symptoms for 2-3 days. For the past 3 days he's had right upper quadrant abdominal pain with postprandial bloating. At times patient can eat without any symptoms other times he can eat and several hours later developed abdominal pain. This morning he noticed blood while wiping and some in the toilet but attributed this to his known hemorrhoids. He has not had dyspnea on exertion but he does report orthopnea. His weight has remained stable and he's had no lower extremity swelling.  ED Course:  Afebrile-BP 117/70-pulse 66-respirations 20-remains saturations 95-98%  2 view chest x-ray: Stable cardiomegaly with diffuse interstitial prominence suggesting atypical infection in a background of COPD; per my inspection the films demonstrate diffuse fine changes in the mid fields to the bases which appear consistent with pneumonitis RUQ Abdominal ultrasound:  Well-distended gallbladder with diffuse gallbladder wall thickening without cholelithiasis consistent with acalculous cholecystitis, right pleural effusion which is small, small hemangioma within the liver Lab data: Sodium 137, potassium 4.3, BUN 13, creatinine 0.91, LFTs are normal except for slightly elevated total bilirubin at 1.3, BNP 530, troponin 0.01, WBCs 8500 and differential was not obtained, hemoglobin 14, platelets 234,000, PT 29.2, INR 2.8 to, glucose 96, urinalysis unremarkable, fecal occult blood was positive Rocephin 2 g IV 1 dose Doxycycline 100 mg IV 1 dose  Review of Systems:  In addition to the HPI above,  No Fever-chills, myalgias or other constitutional symptoms No Headache, changes with Vision or hearing, new weakness, tingling, numbness in any extremity, No problems swallowing food or Liquids No Chest pain, palpitations or DOE No N/V; no melena or hematochezia, no dark tarry stools No dysuria, hematuria or flank pain No new skin rashes, lesions, masses or bruises, No new joints pains-aches No recent weight gain or loss No polyuria, polydypsia or polyphagia,   Past Medical History  Diagnosis Date  . Coronary artery disease     Hx MI 1992, CABG 1998 , Nuc study 11.2013 large scar but no ischemia  . 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 Paroxysmal atrial fibrillation (HCC)   . GERD (gastroesophageal reflux disease)   . H. pylori infection     Hx of   . Cardiomyopathy, ischemic 2011    with EF 25-35% by echo  . H/O myocardial infarction, greater than 8 weeks 1992    large ant wall injury  . NSVT (nonsustained ventricular tachycardia) [facility])     Past Surgical History  Procedure Laterality Date  . Nasal sinus surgery  05/31/2010    Dr. Benjamine Mola  .  Coronary artery bypass graft  1998    x 5  . Cataract extraction, bilateral    . Knee surgery      right  . Hernia repair  1960's    right inguinal  . Icd generator  change  01/2008    medtronic, hx+ EP study     reports that he has never smoked. He has never used smokeless tobacco. He reports that he drinks about 1.2 oz of alcohol per week. He reports that he does not use illicit drugs.  Mobility: Without assistive devices Work history: Worked in [employer] and retired one year ago   Allergies  Allergen Reactions  . Amoxicillin Rash  . Penicillins Rash    Family History  Problem Relation Age of Onset  . Colon cancer Neg Hx   . Heart disease Father     questionable  . Stroke Mother   . Heart failure Sister   . Healthy Brother   . Healthy Sister   . Parkinsonism Brother      Prior to Admission medications   Medication Sig Start Date End Date Taking? Authorizing Provider  acetaminophen (TYLENOL) 650 MG CR tablet Take 650 mg by mouth 2 (two) times daily.    Yes Historical Provider, MD  AMBULATORY NON FORMULARY MEDICATION Take 1 tablet by mouth 2 (two) times daily. Medication Name: GENETIC-AF study drug Metoprolol XL 200 mg QD vs Bucindolol 50 mg BID 01/13/15  Yes Thompson Grayer, MD  aspirin 81 MG chewable tablet Chew 1 tablet (81 mg total) by mouth on Monday, Wednesday, & Friday. 08/26/14  Yes Deboraha Sprang, MD  cetirizine (ZYRTEC) 10 MG tablet Take 10 mg by mouth daily.     Yes Historical Provider, MD  Cholecalciferol (VITAMIN D) 2000 UNITS CAPS Take 1 capsule by mouth daily.   Yes Historical Provider, MD  dicyclomine (BENTYL) 10 MG/5ML syrup Take 10 mg by mouth 2 (two) times daily as needed (IBS).    Yes Historical Provider, MD  furosemide (LASIX) 40 MG tablet Take 1 tablet (40 mg total) by mouth daily. 01/27/15  Yes Thompson Grayer, MD  glycopyrrolate (ROBINUL) 2 MG tablet Take 2 mg by mouth 2 (two) times daily as needed (stomach).   Yes Historical Provider, MD  isosorbide mononitrate (IMDUR) 30 MG 24 hr tablet Take 15 mg by mouth daily.   Yes Historical Provider, MD  lisinopril (PRINIVIL,ZESTRIL) 5 MG tablet Take 1 tablet (5 mg total) by  mouth at bedtime. 04/25/15  Yes Scott T Kathlen Mody, PA-C  magnesium oxide (MAG-OX) 400 MG tablet Take 400 mg by mouth daily.   Yes Historical Provider, MD  mometasone (ASMANEX) 220 MCG/INH inhaler Inhale 1 puff into the lungs daily.    Yes Historical Provider, MD  mometasone (NASONEX) 50 MCG/ACT nasal spray Place 2 sprays into the nose daily as needed (allergies).    Yes Historical Provider, MD  montelukast (SINGULAIR) 10 MG tablet Take 1 tablet (10 mg total) by mouth at bedtime. PLEASE SCHEDULE APPOINTMENT. 02/01/15  Yes Troy Sine, MD  nitroGLYCERIN (NITROSTAT) 0.4 MG SL tablet Place 1 tablet (0.4 mg total) under the tongue every 5 (five) minutes as needed for chest pain. 08/26/14  Yes Deboraha Sprang, MD  omeprazole (PRILOSEC) 20 MG capsule Take 1 capsule (20 mg total) by mouth 2 (two) times daily before a meal. 04/19/15  Yes Manus Gunning, MD  silodosin (RAPAFLO) 4 MG CAPS capsule Take 8 mg by mouth See admin instructions. Every 2 days  Yes Historical Provider, MD  simvastatin (ZOCOR) 40 MG tablet TAKE ONE TABLET BY MOUTH AT BEDTIME 06/08/14  Yes Troy Sine, MD  spironolactone (ALDACTONE) 25 MG tablet Take 0.5 tablets (12.5 mg total) by mouth daily. 06/04/14  Yes Brett Canales, PA-C  warfarin (COUMADIN) 5 MG tablet Take 1 tablet by mouth daily or as directed by coumadin clinic Patient taking differently: Take 2.5-5 mg by mouth daily. Take 2.5mg  on Monday, Wednesday and Friday. All other days take 5mg  07/29/14  Yes Rockne Menghini, RPH-CPP    Physical Exam: Filed Vitals:   06/09/15 0610 06/09/15 0615 06/09/15 0630 06/09/15 0700  BP: 105/74 116/69 111/69 113/68  Pulse:  38 69 66  Temp:      TempSrc:      Resp:  14 14 12   Height:      Weight:      SpO2:  92% 96% 95%      Constitutional: NAD, calm, comfortable Eyes: PERRL, lids and conjunctivae normal ENMT: Mucous membranes are moist. Posterior pharynx clear of any exudate or lesions.Normal dentition.  Neck: normal, supple, no  masses, no thyromegaly Respiratory: clear to auscultation bilaterally, no wheezing, no crackles. Normal respiratory effort. No accessory muscle use.  Cardiovascular: Regular rate and rhythm, no murmurs / rubs / gallops. No extremity edema. 2+ pedal pulses. No carotid bruits. No JVD Abdomen: Focal right upper quadrant tenderness with palpation but no epigastric tenderness, no masses palpated. No hepatosplenomegaly. Bowel sounds positive.  Musculoskeletal: no clubbing / cyanosis. No joint deformity upper and lower extremities. Good ROM, no contractures. Normal muscle tone.  Skin: no rashes, lesions, ulcers. No induration Neurologic: CN 2-12 grossly intact. Sensation intact, DTR normal. Strength 5/5 x all 4 extremities.  Psychiatric: Normal judgment and insight. Alert and oriented x 3. Normal mood.    Labs on Admission: I have personally reviewed following labs and imaging studies  CBC:  Recent Labs Lab 06/09/15 0529  WBC 8.5  HGB 14.0  HCT 43.5  MCV 86.1  PLT [id number]   Basic Metabolic Panel:  Recent Labs Lab 06/09/15 0529  NA 137  K 4.3  CL 101  CO2 25  GLUCOSE 96  BUN 13  CREATININE 0.91  CALCIUM 9.4   GFR: Estimated Creatinine Clearance: 64.4 mL/min (by C-G formula based on Cr of 0.91). Liver Function Tests:  Recent Labs Lab 06/09/15 0529  AST 23  ALT 22  ALKPHOS 59  BILITOT 1.3*  PROT 6.4*  ALBUMIN 3.7    Recent Labs Lab 06/09/15 0529  LIPASE 33   No results for input(s): AMMONIA in the last 168 hours. Coagulation Profile:  Recent Labs Lab 06/09/15 0529  INR 2.82*   Cardiac Enzymes: No results for input(s): CKTOTAL, CKMB, CKMBINDEX, TROPONINI in the last 168 hours. BNP (last 3 results) No results for input(s): PROBNP in the last 8760 hours. HbA1C: No results for input(s): HGBA1C in the last 72 hours. CBG: No results for input(s): GLUCAP in the last 168 hours. Lipid Profile: No results for input(s): CHOL, HDL, LDLCALC, TRIG, CHOLHDL, LDLDIRECT in  the last 72 hours. Thyroid Function Tests: No results for input(s): TSH, T4TOTAL, FREET4, T3FREE, THYROIDAB in the last 72 hours. Anemia Panel: No results for input(s): VITAMINB12, FOLATE, FERRITIN, TIBC, IRON, RETICCTPCT in the last 72 hours. Urine analysis:    Component Value Date/Time   COLORURINE YELLOW 06/09/2015 0610   APPEARANCEUR CLEAR 06/09/2015 0610   LABSPEC 1.015 06/09/2015 0610   PHURINE 6.5 06/09/2015 0610   GLUCOSEU  NEGATIVE 06/09/2015 0610   HGBUR NEGATIVE 06/09/2015 0610   BILIRUBINUR NEGATIVE 06/09/2015 0610   KETONESUR 15* 06/09/2015 0610   PROTEINUR NEGATIVE 06/09/2015 0610   UROBILINOGEN 0.2 05/02/2007 2207   NITRITE NEGATIVE 06/09/2015 0610   LEUKOCYTESUR TRACE* 06/09/2015 0610   Sepsis Labs: @LABRCNTIP (procalcitonin:4,lacticidven:4) )No results found for this or any previous visit (from the past 240 hour(s)).   Radiological Exams on Admission: Dg Chest 2 View  06/09/2015  CLINICAL DATA:  Shortness of breath for few days. EXAM: CHEST  2 VIEW COMPARISON:  Chest radiograph Jun 22, 2014 FINDINGS: The cardiac silhouette appears moderately enlarged, status post median sternotomy for CABG. Single lead LEFT cardiac defibrillator in situ. Diffuse interstitial prominence with strandy densities in the lung bases, blunting of the RIGHT costophrenic angle is compatible pleural thickening. Increased lung volumes and flattened hemidiaphragms. No pneumothorax. Soft tissue planes and included osseous structures are nonsuspicious. IMPRESSION: Similar cardiomegaly. Diffuse interstitial prominence suggesting atypical infection in a background of COPD. Electronically Signed   By: Elon Alas M.D.   On: 06/09/2015 06:04   US Abdomen Limited Ruq  06/09/2015  CLINICAL DATA:  Right upper quadrant pain for 3 days EXAM: US ABDOMEN LIMITED - RIGHT UPPER QUADRANT COMPARISON:  None. FINDINGS: Gallbladder: Well distended with diffuse gallbladder wall thickening to 6-7 mm. Edema is noted  within the gallbladder wall. No gallstones are seen. No pericholecystic fluid is noted. Common bile duct: Diameter: 2.4 mm. Liver: A 1.4 cm echogenic lesion is noted within the right lobe of the liver likely representing a small hemangioma. Incidental note is made of small right pleural effusion. IMPRESSION: Well distended gallbladder with diffuse gallbladder wall thickening. No cholelithiasis is noted. In the appropriate clinical setting this is consistent with acalculous cholecystitis. Small right pleural effusion. Echogenic focus within the liver likely representing small hemangioma. Electronically Signed   By: Inez Catalina M.D.   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 pylori serology; if HIDA scan equivocal regarding biliary etiology to patient's symptoms may need GI consultation for upper endoscopy  Active Problems:   Asthma/Productive cough -Patient is afebrile without leukocytosis and no definitive focal infiltrate on chest x-ray suggestive of pneumonia but does have basilar findings concerning for pneumonitis -Check respiratory viral panel and influenza PCR -Currently not hypoxemic -Symptoms not consistent with heart failure (see below) -Chek Procalcitonin -Continue preadmission asthma medications-currently compensated without wheezing and moving air well    Ischemic cardiomyopathy/Chronic  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, NYHA class 1 (EF 20-25%)/moderate MR -Patient is experiencing orthopnea and shortness of breath without edema or JVD -Patient reports outpatient weight is stable and current weight here is stable (actually lower than cardiology outpatient visit weight in April) -Last echocardiogram was in March [id number]: Severe systolic dysfunction with an EF of 20-25%, multiple regional wall motion abnormalities, grade 2 diastolic dysfunction, moderate mitral regurgitation, mild systolic dysfunction with biatrial enlargement -Continue Lasix, Aldactone and ACE inhibitor -Cardiology consulted; at minimum may need surgical clearance -As noted above do  not suspect right upper quadrant pain associated with heart failure and do not suspect respiratory symptoms at this juncture are related to heart failure    Paroxysmal atrial fibrillation  -Currently rate controlled maintaining sinus rhythm on Tikosyn -Continue study drug -Pharmacy to manage warfarin; may need to hold it surgery indicated this admission    Hemorrhoids, internal -Patient reported single episode today of blood on toilet tissue and small amount of blood in toilet after passing bowel movement and reports is similar to his prior episodes regarding his hemorrhoids -Was FOB positive and ER but hemoglobin stable -Continue to follow    History of ventricular tachycardia/ ICD (implantable cardioverter-defibrillator) in place -Continue chronic magnesium    CABG '98. Low risk Myoview 11/13 -Currently asymptomatic    Hyperlipidemia LDL goal <70 -Continue Zocor      DVT prophylaxis: Warfarin Code Status: Full code  Family Communication: Wife, Sharief Girvin at bedside Disposition Plan: Anticipate discharge back to previous home environment Consults called: [facility] Cardiology on-call; Gen. Surgery/[facility] Admission status: Inpatient/telemetry    Samella Parr ANP-BC [facility] Pager [phone]   If 7PM-7AM, please contact night-coverage [email or web address] Password [email or web address]  06/09/2015, 9:07 AM

## 2015-06-09 NOTE — Progress Notes (Signed)
HIDA negative, gallbladder with visualization, therefore no evidence of acute acalculous cholecystitis, no surgery indicated at this time.  Would investigate other causes.  Can follow up as outpatient as needed.  Jomarie Longs, PA-C General Surgery [facility] [phone]

## 2015-06-09 NOTE — Progress Notes (Addendum)
HIDA negative. H Pylori serologies pending. OP notes re: Optivol reviewed and despite no typical signs of CHF exacerbation pt demonstrating apparent early signs of volume overload. I had already ordered home meds including Lasix and Aldactone but will defer to Cardiology to determine if he needs IV Lasix and tx for evolving CHF exacerbation. Will begin heart healthy diet.  Erin Hearing, ANP

## 2015-06-09 NOTE — Telephone Encounter (Signed)
Ok thanks, he can follow up with [name] after his ER visit, pending what the issue is determine to be

## 2015-06-09 NOTE — Telephone Encounter (Signed)
Spoke with patient's wife and he is in the ED now. Possible gallbladder issue.

## 2015-06-09 NOTE — ED Provider Notes (Signed)
CSN: [id number]     Arrival date & time 06/09/15  0514 History   First MD Initiated Contact with Patient 06/09/15 0556     Chief Complaint  Patient presents with  . Abdominal Pain  . Shortness of Breath     (Consider location/radiation/quality/duration/timing/severity/associated sxs/prior Treatment) HPI   Pt with hx CAD s/p large anterior wall MI 1992, CABG 1998, paroxysmal afib, medtronic defibrillator, CHF (EF25-30%), mild asthma, p/w 4 days of SOB, DOE, cough, upper abdominal pain.  States that overall since he started an afib study 6 months ago and has not been doing as well as he was previously - gets tired more quickly, gets more SOB.  Significant change over the past 3-4 days.  The RUQ/epigastric area have been aching, worse with pushing on it. Has had decreased appetite and bloating. Cough has been productive of yellow sputum, increased SOB and DOE.  Had single episode of bright red blood on the toilet paper and in the toilet this morning.   Denies fever, chills, myalgias, lightheadedness/dizziness, chest pain, other bowel changes, urinary symptoms, leg swelling.   Pt tracks his weights, denies any changes.    PCP [facility] - [facility]    Past Medical History  Diagnosis Date  . Coronary artery disease     Hx MI 1992, CABG 1998 , Nuc study 11.2013 large scar but no ischemia  . 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 Paroxysmal atrial fibrillation (HCC)   . GERD (gastroesophageal reflux disease)   . H. pylori infection     Hx of   . Cardiomyopathy, ischemic 2011    with EF 25-35% by echo  . H/O myocardial infarction, greater than 8 weeks 1992    large ant wall injury  . NSVT (nonsustained ventricular tachycardia) [facility])    Past Surgical History  Procedure Laterality Date  . Nasal sinus surgery  05/31/2010    Dr. Benjamine Mola  . Coronary artery bypass graft  1998    x 5  . Cataract extraction, bilateral    . Knee surgery       right  . Hernia repair    . Icd generator change  01/2008    medtronic, hx+ EP study   Family History  Problem Relation Age of Onset  . Colon cancer Neg Hx   . Heart disease Father     questionable  . Stroke Mother   . Heart failure Sister   . Healthy Brother   . Healthy Sister   . Parkinsonism Brother    Social History  Substance Use Topics  . Smoking status: Never Smoker   . Smokeless tobacco: Never Used  . Alcohol Use: 1.2 oz/week    0 Standard drinks or equivalent, 2 Glasses of wine per week     Comment: socially    Review of Systems  All other systems reviewed and are negative.     Allergies  Amoxicillin and Penicillins  Home Medications   Prior to Admission medications   Medication Sig Start Date End Date Taking? Authorizing Provider  acetaminophen (TYLENOL) 650 MG CR tablet Take 650 mg by mouth 2 (two) times daily.     Historical Provider, MD  AMBULATORY NON FORMULARY MEDICATION Take 1 tablet by mouth 2 (two) times daily. Medication Name: GENETIC-AF study drug Metoprolol XL 200 mg QD vs Bucindolol 50 mg BID 01/13/15   Thompson Grayer, MD  aspirin 81 MG chewable tablet Chew 1 tablet (81 mg  total) by mouth on Monday, Wednesday, & Friday. 08/26/14   Deboraha Sprang, MD  cetirizine (ZYRTEC) 10 MG tablet Take 10 mg by mouth daily.      Historical Provider, MD  Cholecalciferol (VITAMIN D) 2000 UNITS CAPS Take 1 capsule by mouth daily.    Historical Provider, MD  dicyclomine (BENTYL) 10 MG/5ML syrup Take by mouth 2 (two) times daily as needed.    Historical Provider, MD  furosemide (LASIX) 40 MG tablet Take 1 tablet (40 mg total) by mouth daily. 01/27/15   Thompson Grayer, MD  glycopyrrolate (ROBINUL) 2 MG tablet Take 2 mg by mouth 2 (two) times daily as needed (stomach).    Historical Provider, MD  isosorbide mononitrate (IMDUR) 30 MG 24 hr tablet Take 15 mg by mouth daily.    Historical Provider, MD  lisinopril (PRINIVIL,ZESTRIL) 5 MG tablet Take 1 tablet (5 mg total) by mouth  at bedtime. 04/25/15   Liliane Shi, PA-C  magnesium oxide (MAG-OX) 400 MG tablet Take 400 mg by mouth daily.    Historical Provider, MD  mometasone [facility]) 220 MCG/INH inhaler Inhale 1 puff into the lungs daily.     Historical Provider, MD  mometasone (NASONEX) 50 MCG/ACT nasal spray Place 2 sprays into the nose daily as needed (allergies).     Historical Provider, MD  montelukast (SINGULAIR) 10 MG tablet Take 1 tablet (10 mg total) by mouth at bedtime. PLEASE SCHEDULE APPOINTMENT. 02/01/15   Troy Sine, MD  nitroGLYCERIN (NITROSTAT) 0.4 MG SL tablet Place 1 tablet (0.4 mg total) under the tongue every 5 (five) minutes as needed for chest pain. 08/26/14   Deboraha Sprang, MD  omeprazole (PRILOSEC) 20 MG capsule Take 1 capsule (20 mg total) by mouth 2 (two) times daily before a meal. 04/19/15   Manus Gunning, MD  silodosin (RAPAFLO) 4 MG CAPS capsule Take 8 mg by mouth. Every 2 days    Historical Provider, MD  simvastatin (ZOCOR) 40 MG tablet TAKE ONE TABLET BY MOUTH AT BEDTIME 06/08/14   Troy Sine, MD  spironolactone (ALDACTONE) 25 MG tablet Take 0.5 tablets (12.5 mg total) by mouth daily. 06/04/14   Brett Canales, PA-C  warfarin (COUMADIN) 5 MG tablet Take 1 tablet by mouth daily or as directed by coumadin clinic 07/29/14   Casimiro Needle [place], RPH-CPP   BP 105/74 mmHg  Pulse 94  Temp(Src) 97.8 F (36.6 C) (Oral)  Resp 20  Ht 5\' 7"  (1.702 m)  Wt 64.864 kg  BMI 22.39 kg/m2  SpO2 98% Physical Exam  Constitutional: He appears well-developed and well-nourished. No distress.  HENT:  Head: Normocephalic and atraumatic.  Neck: Neck supple.  Cardiovascular: Normal rate and regular rhythm.   Pulmonary/Chest: Effort normal. No respiratory distress. He has no wheezes. He has rales (bilateral bases).  Abdominal: Soft. Bowel sounds are normal. He exhibits no distension. There is tenderness in the right upper quadrant and epigastric area. There is no rebound and no guarding.   Genitourinary: Rectal exam shows internal hemorrhoid. Rectal exam shows no external hemorrhoid.  Rectal exam: Small amount of light brown stool on glove  Musculoskeletal: He exhibits no edema.  Neurological: He is alert. He exhibits normal muscle tone.  Skin: He is not diaphoretic.  Nursing note and vitals reviewed.   ED Course  Procedures (including critical care time) Labs Review Labs Reviewed  COMPREHENSIVE METABOLIC PANEL - Abnormal; Notable for the following:    Total Protein 6.4 (*)    Total  Bilirubin 1.3 (*)    All other components within normal limits  URINALYSIS, ROUTINE W REFLEX MICROSCOPIC (NOT AT [facility]) - Abnormal; Notable for the following:    Ketones, ur 15 (*)    Leukocytes, UA TRACE (*)    All other components within normal limits  BRAIN NATRIURETIC PEPTIDE - Abnormal; Notable for the following:    B Natriuretic Peptide 529.9 (*)    All other components within normal limits  PROTIME-INR - Abnormal; Notable for the following:    Prothrombin Time 29.2 (*)    INR 2.82 (*)    All other components within normal limits  URINE MICROSCOPIC-ADD ON - Abnormal; Notable for the following:    Squamous Epithelial / LPF 0-5 (*)    All other components within normal limits  POC OCCULT BLOOD, ED - Abnormal; Notable for the following:    Fecal Occult Bld POSITIVE (*)    All other components within normal limits  LIPASE, BLOOD  CBC  PROCALCITONIN  INFLUENZA PANEL BY PCR (TYPE A & B, H1N1)  I-STAT TROPOININ, ED  I-STAT TROPOININ, ED  TYPE AND SCREEN  ABO/RH    Imaging Review Dg Chest 2 View  06/09/2015  CLINICAL DATA:  Shortness of breath for few days. EXAM: CHEST  2 VIEW COMPARISON:  Chest radiograph Jun 22, 2014 FINDINGS: The cardiac silhouette appears moderately enlarged, status post median sternotomy for CABG. Single lead LEFT cardiac defibrillator in situ. Diffuse interstitial prominence with strandy densities in the lung bases, blunting of the RIGHT costophrenic angle  is compatible pleural thickening. Increased lung volumes and flattened hemidiaphragms. No pneumothorax. Soft tissue planes and included osseous structures are nonsuspicious. IMPRESSION: Similar cardiomegaly. Diffuse interstitial prominence suggesting atypical infection in a background of COPD. Electronically Signed   By: Elon Alas M.D.   On: 06/09/2015 06:04   US Abdomen Limited Ruq  06/09/2015  CLINICAL DATA:  Right upper quadrant pain for 3 days EXAM: US ABDOMEN LIMITED - RIGHT UPPER QUADRANT COMPARISON:  None. FINDINGS: Gallbladder: Well distended with diffuse gallbladder wall thickening to 6-7 mm. Edema is noted within the gallbladder wall. No gallstones are seen. No pericholecystic fluid is noted. Common bile duct: Diameter: 2.4 mm. Liver: A 1.4 cm echogenic lesion is noted within the right lobe of the liver likely representing a small hemangioma. Incidental note is made of small right pleural effusion. IMPRESSION: Well distended gallbladder with diffuse gallbladder wall thickening. No cholelithiasis is noted. In the appropriate clinical setting this is consistent with acalculous cholecystitis. Small right pleural effusion. Echogenic focus within the liver likely representing small hemangioma. Electronically Signed   By: Inez Catalina M.D.   On: 06/09/2015 07:17   I have personally reviewed and evaluated these images and lab results as part of my medical decision-making.   EKG Interpretation   Date/Time:  Thursday Jun 09 2015 05:30:52 EDT Ventricular Rate:  68 PR Interval:  181 QRS Duration: 145 QT Interval:  442 QTC Calculation: 470 R Axis:   -52 Text Interpretation:  Sinus rhythm Ventricular bigeminy Probable left  atrial enlargement Left bundle branch block No significant change since  last tracing Confirmed by LITTLE MD, RACHEL [id number]) on 06/09/2015 5:36:37  AM       7:38 AM I spoke with general surgery, Megan Dort, PA-C, who recommends HIDA scan and will see patient in the ED.   Recommends medical admission.    MDM   Final diagnoses:  Pain  Cholecystitis  CAP (community acquired pneumonia)    Patient  with hx CAD s/p MI, CABG, paroxysmal afib on coumadin, medtronic defibrillator in place p/w upper abdominal pain, decreased appetite, bloating as well as cough productive of yellow sputum and increased SOB x 4 days.  Single episode of rectal bleeding this morning.  US shows acute acalculous cholecystitis with normal LFTs and normal WBC but with significant tenderness on exam.  Discussed with general surgery who recommended HIDA (ordered) and will see pt in consult in the ED.  Pt also with cough and SOB, CXR demonstrates atypical infection.  Pt denies CP.  Discussed pt with Dr Rex Kras who also saw the patient.  Rocephin and doxycycline ordered to cover cholecystitis and CAP.  Report of rectal bleeding - pt does have internal hemorrhoids, no gross blood on exam, hemoccult positive, on coumadin.  Doubt significant GI bleed at this time.  General surgery to see pt in ED.  Admitted to [facility], Erin Hearing PA-C, who accepts pt in admission.      Clayton Bibles, PA-C 06/09/15 [place], MD 06/13/15 [phone]

## 2015-06-09 NOTE — ED Notes (Signed)
Heart Care at bedside with patient and family.

## 2015-06-09 NOTE — Consult Note (Signed)
[facility] Surgery Consult Note  [name] Jul 27, 1940  [id number].    Requesting MD: Clayton Bibles, PA-C Chief Complaint/Reason for Consult: Abdominal pain  HPI:  75 y/o white male with PMH CAD s/p large anterior wall MI 1992, CABG 1998, paroxysmal afib, medtronic defibrillator, CHF (EF25-30%), mild asthma presents to [facility] with 4 days of SOB, DOE, cough, upper abdominal pain. He has never had abdominal pain like this before.  He's noticed the osther symptoms up to 6 mo ago, but the abdominal pain started 4-5 days ago and a significant change over the past 2-3 days. The RUQ/epigastric area have been aching, worse with pushing on it.  Has had decreased appetite and bloating.  Had single episode of bright red blood on the toilet paper and in the toilet this morning. Denies N/V, fever, chills, chest pain, other bowel or bladder changes. No wt los or wt gain.  No h/o gallbladder attacks.  H/o right groin surgery 50 years ago.  ROS: All systems reviewed and otherwise negative except for as above  Family History  Problem Relation Age of Onset  . Colon cancer Neg Hx   . Heart disease Father     questionable  . Stroke Mother   . Heart failure Sister   . Healthy Brother   . Healthy Sister   . Parkinsonism Brother     Past Medical History  Diagnosis Date  . Coronary artery disease     Hx MI 1992, CABG 1998 , Nuc study 11.2013 large scar but no ischemia  . 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 Paroxysmal atrial fibrillation (HCC)   . GERD (gastroesophageal reflux disease)   . H. pylori infection     Hx of   . Cardiomyopathy, ischemic 2011    with EF 25-35% by echo  . H/O myocardial infarction, greater than 8 weeks 1992    large ant wall injury  . NSVT (nonsustained ventricular tachycardia) [facility])     Past Surgical History  Procedure Laterality Date  . Nasal sinus surgery  05/31/2010    Dr. Benjamine Mola  . Coronary artery bypass  graft  1998    x 5  . Cataract extraction, bilateral    . Knee surgery      right  . Hernia repair    . Icd generator change  01/2008    medtronic, hx+ EP study    Social History:  reports that he has never smoked. He has never used smokeless tobacco. He reports that he drinks about 1.2 oz of alcohol per week. He reports that he does not use illicit drugs.  Allergies:  Allergies  Allergen Reactions  . Amoxicillin Rash  . Penicillins Rash     (Not in a hospital admission)  Blood pressure 113/68, pulse 66, temperature 97.8 F (36.6 C), temperature source Oral, resp. rate 12, height 5' 7" (1.702 m), weight 143 lb (64.864 kg), SpO2 95 %. Physical Exam: General: pleasant, WD/WN white male who is laying in bed in NAD HEENT: head is normocephalic, atraumatic.  Sclera are noninjected.  PERRL.  Ears and nose without any masses or lesions.  Mouth is pink and moist Heart: regular, rate, and rhythm.  No obvious murmurs, gallops, or rubs noted.  Palpable pedal pulses bilaterally Lungs: CTAB, no wheezes, rhonchi, or rales noted.  Respiratory effort nonlabored, good effort Abd: soft, mild tenderness in RUQ/epigastrium, no peritoneal signs, neg murphys, +BS, no masses, hernias, or organomegaly MS: all  4 extremities are symmetrical with no cyanosis, clubbing, or edema. Skin: warm and dry with no masses, lesions, or rashes Psych: A&Ox3 with an appropriate affect.   Results for orders placed or performed during the hospital encounter of 06/09/15 (from the past 48 hour(s))  Lipase, blood     Status: None   Collection Time: 06/09/15  5:29 AM  Result Value Ref Range   Lipase 33 11 - 51 U/L  Comprehensive metabolic panel     Status: Abnormal   Collection Time: 06/09/15  5:29 AM  Result Value Ref Range   Sodium 137 135 - 145 mmol/L   Potassium 4.3 3.5 - 5.1 mmol/L   Chloride 101 101 - 111 mmol/L   CO2 25 22 - 32 mmol/L   Glucose, Bld 96 65 - 99 mg/dL   BUN 13 6 - 20 mg/dL   Creatinine, Ser  0.91 0.61 - 1.24 mg/dL   Calcium 9.4 8.9 - 10.3 mg/dL   Total Protein 6.4 (L) 6.5 - 8.1 g/dL   Albumin 3.7 3.5 - 5.0 g/dL   AST 23 15 - 41 U/L   ALT 22 17 - 63 U/L   Alkaline Phosphatase 59 38 - 126 U/L   Total Bilirubin 1.3 (H) 0.3 - 1.2 mg/dL   GFR calc non Af Amer >60 >60 mL/min   GFR calc Af Amer >60 >60 mL/min    Comment: (NOTE) The eGFR has been calculated using the CKD EPI equation. This calculation has not been validated in all clinical situations. eGFR's persistently <60 mL/min signify possible Chronic Kidney Disease.    Anion gap 11 5 - 15  CBC     Status: None   Collection Time: 06/09/15  5:29 AM  Result Value Ref Range   WBC 8.5 4.0 - 10.5 K/uL   RBC 5.05 4.22 - 5.81 MIL/uL   Hemoglobin 14.0 13.0 - 17.0 g/dL   HCT 43.5 39.0 - 52.0 %   MCV 86.1 78.0 - 100.0 fL   MCH 27.7 26.0 - 34.0 pg   MCHC 32.2 30.0 - 36.0 g/dL   RDW 13.9 11.5 - 15.5 %   Platelets 234 150 - 400 K/uL  Brain natriuretic peptide     Status: Abnormal   Collection Time: 06/09/15  5:29 AM  Result Value Ref Range   B Natriuretic Peptide 529.9 (H) 0.0 - 100.0 pg/mL  Protime-INR     Status: Abnormal   Collection Time: 06/09/15  5:29 AM  Result Value Ref Range   Prothrombin Time 29.2 (H) 11.6 - 15.2 seconds   INR 2.82 (H) 0.00 - 1.49  I-Stat Troponin, ED (not at [facility])     Status: None   Collection Time: 06/09/15  6:03 AM  Result Value Ref Range   Troponin i, poc 0.01 0.00 - 0.08 ng/mL   Comment 3            Comment: Due to the release kinetics of cTnI, a negative result within the first hours of the onset of symptoms does not rule out myocardial infarction with certainty. If myocardial infarction is still suspected, repeat the test at appropriate intervals.   Urinalysis, Routine w reflex microscopic     Status: Abnormal   Collection Time: 06/09/15  6:10 AM  Result Value Ref Range   Color, Urine YELLOW YELLOW   APPearance CLEAR CLEAR   Specific Gravity, Urine 1.015 1.005 - 1.030   pH 6.5 5.0  - 8.0   Glucose, UA NEGATIVE NEGATIVE mg/dL  Hgb urine dipstick NEGATIVE NEGATIVE   Bilirubin Urine NEGATIVE NEGATIVE   Ketones, ur 15 (A) NEGATIVE mg/dL   Protein, ur NEGATIVE NEGATIVE mg/dL   Nitrite NEGATIVE NEGATIVE   Leukocytes, UA TRACE (A) NEGATIVE  Urine microscopic-add on     Status: Abnormal   Collection Time: 06/09/15  6:10 AM  Result Value Ref Range   Squamous Epithelial / LPF 0-5 (A) NONE SEEN   WBC, UA 0-5 0 - 5 WBC/hpf   RBC / HPF NONE SEEN 0 - 5 RBC/hpf   Bacteria, UA NONE SEEN NONE SEEN  Type and screen     Status: None   Collection Time: 06/09/15  6:42 AM  Result Value Ref Range   ABO/RH(D) O POS    Antibody Screen NEG    Sample Expiration 06/12/2015   POC occult blood, ED Provider will collect     Status: Abnormal   Collection Time: 06/09/15  7:09 AM  Result Value Ref Range   Fecal Occult Bld POSITIVE (A) NEGATIVE   Dg Chest 2 View  06/09/2015  CLINICAL DATA:  Shortness of breath for few days. EXAM: CHEST  2 VIEW COMPARISON:  Chest radiograph Jun 22, 2014 FINDINGS: The cardiac silhouette appears moderately enlarged, status post median sternotomy for CABG. Single lead LEFT cardiac defibrillator in situ. Diffuse interstitial prominence with strandy densities in the lung bases, blunting of the RIGHT costophrenic angle is compatible pleural thickening. Increased lung volumes and flattened hemidiaphragms. No pneumothorax. Soft tissue planes and included osseous structures are nonsuspicious. IMPRESSION: Similar cardiomegaly. Diffuse interstitial prominence suggesting atypical infection in a background of COPD. Electronically Signed   By: Elon Alas M.D.   On: 06/09/2015 06:04   US Abdomen Limited Ruq  06/09/2015  CLINICAL DATA:  Right upper quadrant pain for 3 days EXAM: US ABDOMEN LIMITED - RIGHT UPPER QUADRANT COMPARISON:  None. FINDINGS: Gallbladder: Well distended with diffuse gallbladder wall thickening to 6-7 mm. Edema is noted within the gallbladder wall. No  gallstones are seen. No pericholecystic fluid is noted. Common bile duct: Diameter: 2.4 mm. Liver: A 1.4 cm echogenic lesion is noted within the right lobe of the liver likely representing a small hemangioma. Incidental note is made of small right pleural effusion. IMPRESSION: Well distended gallbladder with diffuse gallbladder wall thickening. No cholelithiasis is noted. In the appropriate clinical setting this is consistent with acalculous cholecystitis. Small right pleural effusion. Echogenic focus within the liver likely representing small hemangioma. Electronically Signed   By: Inez Catalina M.D.   On: 06/09/2015 07:17      Assessment/Plan Gallbladder wall thickening/distension without stones ?acalculous cholecystitis RUQ/epigastric abdominal pain SOB, cough Rectal bleeding with normal Hgb/Hct Cardiomegaly H/o CABG and defibrilator  Plan: 1.  Admit to medicine, cardiac/pulm workup, will get HIDA scan to see if he truly has cholecystitis.  No stones seen on ultrasound, acalculous cholecystitis a possibility.  Would need cardiac clearance if HIDA positive and surgery was indicated.  INR 2.82, will need this under 1.7 if surgery indicated.  Hold coumadin, could be on heparin but some concern for rectal bleeding 2.  NPO, bowel rest, IVF, pain control, antiemetics, antibiotics (rocephin or cipro) 3.  Will await HIDA findings, will follow.  Wife at bedside and understanding of [place], [facility] Surgery 06/09/2015, 8:58 AM Pager: [phone] (7am - 4:30pm M-F; 7am - 11:30am Sa/Su)

## 2015-06-09 NOTE — ED Notes (Signed)
Attempted report 

## 2015-06-10 ENCOUNTER — Inpatient Hospital Stay (HOSPITAL_BASED_OUTPATIENT_CLINIC_OR_DEPARTMENT_OTHER): Payer: Medicare Other

## 2015-06-10 DIAGNOSIS — I2510 Atherosclerotic heart disease of native coronary artery without angina pectoris: Secondary | ICD-10-CM | POA: Diagnosis not present

## 2015-06-10 DIAGNOSIS — R1013 Epigastric pain: Secondary | ICD-10-CM

## 2015-06-10 DIAGNOSIS — I5023 Acute on chronic systolic (congestive) heart failure: Secondary | ICD-10-CM

## 2015-06-10 DIAGNOSIS — G8929 Other chronic pain: Secondary | ICD-10-CM

## 2015-06-10 DIAGNOSIS — J069 Acute upper respiratory infection, unspecified: Secondary | ICD-10-CM

## 2015-06-10 DIAGNOSIS — R1011 Right upper quadrant pain: Secondary | ICD-10-CM

## 2015-06-10 LAB — H PYLORI, IGM, IGG, IGA AB
H Pylori IgG: <0.9 U/mL (ref 0.0–0.8)
H. Pylogi, Igm Abs: <9 units (ref 0.0–8.9)

## 2015-06-10 LAB — COMPREHENSIVE METABOLIC PANEL
ALT: 20 U/L (ref 17–63)
ANION GAP: 11 (ref 5–15)
AST: 20 U/L (ref 15–41)
Albumin: 3.5 g/dL (ref 3.5–5.0)
Alkaline Phosphatase: 52 U/L (ref 38–126)
BUN: 13 mg/dL (ref 6–20)
CALCIUM: 9.1 mg/dL (ref 8.9–10.3)
CHLORIDE: 102 mmol/L (ref 101–111)
CO2: 26 mmol/L (ref 22–32)
CREATININE: 1.06 mg/dL (ref 0.61–1.24)
Glucose, Bld: 94 mg/dL (ref 65–99)
Potassium: 3.3 mmol/L — ABNORMAL LOW (ref 3.5–5.1)
Sodium: 139 mmol/L (ref 135–145)
Total Bilirubin: 1.4 mg/dL — ABNORMAL HIGH (ref 0.3–1.2)
Total Protein: 6.4 g/dL — ABNORMAL LOW (ref 6.5–8.1)

## 2015-06-10 LAB — CBC
HCT: 40.5 % (ref 39.0–52.0)
Hemoglobin: 13.3 g/dL (ref 13.0–17.0)
MCH: 28.1 pg (ref 26.0–34.0)
MCHC: 32.8 g/dL (ref 30.0–36.0)
MCV: 85.4 fL (ref 78.0–100.0)
PLATELETS: 220 10*3/uL (ref 150–400)
RBC: 4.74 MIL/uL (ref 4.22–5.81)
RDW: 13.6 % (ref 11.5–15.5)
WBC: 7.9 10*3/uL (ref 4.0–10.5)

## 2015-06-10 LAB — PROTIME-INR
INR: 2.63 — ABNORMAL HIGH (ref 0.00–1.49)
Prothrombin Time: 27.7 seconds — ABNORMAL HIGH (ref 11.6–15.2)

## 2015-06-10 MED ORDER — WARFARIN SODIUM 2.5 MG PO TABS
2.5000 mg | ORAL_TABLET | Freq: Once | ORAL | Status: AC
  Administered 2015-06-10: 2.5 mg via ORAL
  Filled 2015-06-10: qty 1

## 2015-06-10 MED ORDER — PERFLUTREN LIPID MICROSPHERE
1.0000 mL | INTRAVENOUS | Status: AC | PRN
  Filled 2015-06-10: qty 10

## 2015-06-10 NOTE — Progress Notes (Signed)
  Echocardiogram 2D Echocardiogram has been performed.  [name] 06/10/2015, 4:25 PM

## 2015-06-10 NOTE — Consult Note (Signed)
[facility] Consult: 10:18 AM 06/10/2015  LOS: 1 day    Referring Provider: Dr Tana Coast  Primary Care Physician:  [name], MD Primary Gastroenterologist:  [name].  Previous [facility] [employer] pt.    Reason for Consultation:  Abdominal pain and flare GERD sxs.    HPI: [name] is a 75 y.o. male.  On Chronic Coumadin for PAF.  Large ant wall MI 1992.  CAD, 1998 CABG. Ischemic CM: EF 20 to 25% per echo.  ICD placed 2000. Hx NSVT.   Hx IBS "spastic colon".  S/p remote inguinal hernia repair.   Gynecomastia per mgram 2012.  Takes BID PPI for GERD.  Treated for h Pylori + serum ~ 1996. H pylori ag negtive in 2014.   No previous colonoscopy due to significant heart dz.  C/o abdominal pain date back several years.   2002 Barium enema unremarkable.  Sigmoid diverticulosis per CT scan 2004.  Negative ultrasound 10/2012.  01/2013 cologuard study was negative.  1996 EGD: normal esophagus, gastritis. No etiology for dysphagia, "suspect functional dysphagia" Per 03/10/15 visit with Dr Havery Moros: Given his cardiac issues and his age, it is not unreasonable to stop colon cancer screening as he has no symptoms and no history of known colon polyps. If he wished to continue screening, he could consider a Cologuard in 2018.  Pt's reflux sxs well controlled with BID Oemrozole,  Intermittent IIBS pain in left abdomen treated with prn Dicyclomine.  Daily BMs.  Limited BPR 3 months ago attributed to hemorrhoids.   3 to 4 days of pain in RUQ, no radiation.  No n/v.  No fevers.  Not improved after Dicyclomine.  Some blood per rectum on one occasion in AM on 5/10, PTA.  Admitted 5/1 PM.  T bil 1.3 to 1.4.  Lipase, alk phos, transaminases normal.  FOBT +.   06/09/15 Ultrasound: diffuse GB wall thickening, could be  acalculous cholecystitis in appropriate clinical setting.   Small right pleural effusion, small right lobe liver foci: likely hemangioma.  06/09/15 HIDA scan unremarkable.  Surgeon, Dr Rosendo Gros sees no evidence cholecystitis.  Cardiologist, Dr Radford Pax, wonders about acute CHF.   Started Rocephin 5/11, 2 doses so far. Pain has resolved.     Past Medical History  Diagnosis Date  . Coronary artery disease     Hx MI 1992, CABG 1998 , Nuc study 11.2013 large scar but no ischemia  . 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 Paroxysmal atrial fibrillation (HCC)   . GERD (gastroesophageal reflux disease)   . H. pylori infection     Hx of   . Cardiomyopathy, ischemic 2011    with EF 25-35% by echo  . H/O myocardial infarction, greater than 8 weeks 1992    large ant wall injury  . NSVT (nonsustained ventricular tachycardia) ([place])   . Acute on chronic systolic CHF (congestive heart failure) ([place]) 06/09/2015    Past Surgical History  Procedure Laterality Date  . Nasal sinus surgery  05/31/2010  Dr. Benjamine Mola  . Coronary artery bypass graft  1998    x 5  . Cataract extraction, bilateral    . Knee surgery      right  . Hernia repair  1960's    right inguinal  . Icd generator change  01/2008    medtronic, hx+ EP study    Prior to Admission medications   Medication Sig Start Date End Date Taking? Authorizing Provider  acetaminophen (TYLENOL) 650 MG CR tablet Take 650 mg by mouth 2 (two) times daily.    Yes Historical Provider, MD  AMBULATORY NON FORMULARY MEDICATION Take 1 tablet by mouth 2 (two) times daily. Medication Name: GENETIC-AF study drug Metoprolol XL 200 mg QD vs Bucindolol 50 mg BID 01/13/15  Yes Thompson Grayer, MD  aspirin 81 MG chewable tablet Chew 1 tablet (81 mg total) by mouth on Monday, Wednesday, & Friday. 08/26/14  Yes Deboraha Sprang, MD  cetirizine (ZYRTEC) 10 MG tablet Take 10 mg by mouth daily.     Yes Historical Provider, MD    Cholecalciferol (VITAMIN D) 2000 UNITS CAPS Take 1 capsule by mouth daily.   Yes Historical Provider, MD  dicyclomine (BENTYL) 10 MG/5ML syrup Take 10 mg by mouth 2 (two) times daily as needed (IBS).    Yes Historical Provider, MD  furosemide (LASIX) 40 MG tablet Take 1 tablet (40 mg total) by mouth daily. 01/27/15  Yes Thompson Grayer, MD  glycopyrrolate (ROBINUL) 2 MG tablet Take 2 mg by mouth 2 (two) times daily as needed (stomach).   Yes Historical Provider, MD  isosorbide mononitrate (IMDUR) 30 MG 24 hr tablet Take 15 mg by mouth daily.   Yes Historical Provider, MD  lisinopril (PRINIVIL,ZESTRIL) 5 MG tablet Take 1 tablet (5 mg total) by mouth at bedtime. 04/25/15  Yes Scott T Kathlen Mody, PA-C  magnesium oxide (MAG-OX) 400 MG tablet Take 400 mg by mouth daily.   Yes Historical Provider, MD  mometasone (ASMANEX) 220 MCG/INH inhaler Inhale 1 puff into the lungs daily.    Yes Historical Provider, MD  mometasone (NASONEX) 50 MCG/ACT nasal spray Place 2 sprays into the nose daily as needed (allergies).    Yes Historical Provider, MD  montelukast (SINGULAIR) 10 MG tablet Take 1 tablet (10 mg total) by mouth at bedtime. PLEASE SCHEDULE APPOINTMENT. 02/01/15  Yes Troy Sine, MD  nitroGLYCERIN (NITROSTAT) 0.4 MG SL tablet Place 1 tablet (0.4 mg total) under the tongue every 5 (five) minutes as needed for chest pain. 08/26/14  Yes Deboraha Sprang, MD  omeprazole (PRILOSEC) 20 MG capsule Take 1 capsule (20 mg total) by mouth 2 (two) times daily before a meal. 04/19/15  Yes Manus Gunning, MD  silodosin (RAPAFLO) 4 MG CAPS capsule Take 8 mg by mouth See admin instructions. Every 2 days   Yes Historical Provider, MD  simvastatin (ZOCOR) 40 MG tablet TAKE ONE TABLET BY MOUTH AT BEDTIME 06/08/14  Yes Troy Sine, MD  spironolactone (ALDACTONE) 25 MG tablet Take 0.5 tablets (12.5 mg total) by mouth daily. 06/04/14  Yes Brett Canales, PA-C  warfarin (COUMADIN) 5 MG tablet Take 1 tablet by mouth daily or as  directed by coumadin clinic Patient taking differently: Take 2.5-5 mg by mouth daily. Take 2.43m on Monday, Wednesday and Friday. All other days take 542m6/30/16  Yes Kristin L Alvstad, RPH-CPP    Scheduled Meds: . aspirin  81 mg Oral Daily  . BUC-CLIN 303 (study drug)- home med  1 tablet Oral  BID  . budesonide (PULMICORT) nebulizer solution  0.25 mg Nebulization BID  . cefTRIAXone (ROCEPHIN)  IV  2 g Intravenous Q24H  . isosorbide mononitrate  15 mg Oral Daily  . lisinopril  5 mg Oral QHS  . magnesium oxide  400 mg Oral Daily  . montelukast  10 mg Oral QHS  . pantoprazole  40 mg Oral Daily  . simvastatin  40 mg Oral QHS  . sodium chloride flush  3 mL Intravenous Q12H  . spironolactone  12.5 mg Oral Daily  . tamsulosin  0.4 mg Oral QPC supper  . Warfarin - Pharmacist Dosing Inpatient   Does not apply q1800   Infusions:   PRN Meds: acetaminophen **OR** acetaminophen, morphine injection, prochlorperazine   Allergies as of 06/09/2015 - Review Complete 06/09/2015  Allergen Reaction Noted  . Amoxicillin Rash 08/16/2006  . Penicillins Rash 08/16/2006    Family History  Problem Relation Age of Onset  . Colon cancer Neg Hx   . Heart disease Father     questionable  . Stroke Mother   . Heart failure Sister   . Healthy Brother   . Healthy Sister   . Parkinsonism Brother     Social History   Social History  . Marital Status: Married    Spouse Name: N/A  . Number of Children: 3  . Years of Education: N/A   Occupational History  . slef employed semi retired    Social History Main Topics  . Smoking status: Never Smoker   . Smokeless tobacco: Never Used  . Alcohol Use: 1.2 oz/week    0 Standard drinks or equivalent, 2 Glasses of wine per week     Comment: socially  . Drug Use: No  . Sexual Activity: Not on file   Other Topics Concern  . Not on file   Social History Narrative    REVIEW OF SYSTEMS: Constitutional:  Current weight reflects 8# weight loss in last 3  weeks ENT:  No nose bleeds Pulm:  2 months of increased but now stable increased DOE.  No cough CV:  No palpitations, no LE edema. No chest pain Since 11/2015 receiving a cardiac study drug.  Takes lasix prn for weight gain or swelling.  GU:  No hematuria, no frequency GI:  Per HPI.  No dysphagia Heme:  No issues with excessive bleeding or bruising   Transfusions:  none Neuro:  No headaches, no peripheral tingling or numbness Derm:  No itching, no rash or sores.  Endocrine:  No sweats or chills.  No polyuria or dysuria Immunization:  Not queried Travel:  None beyond local counties in last few months.    PHYSICAL EXAM: Vital signs in last 24 hours: Filed Vitals:   06/09/15 2206 06/10/15 0429  BP: 90/48 104/58  Pulse: 90 69  Temp: 97.9 F (36.6 C) 98.1 F (36.7 C)  Resp: 17 16   Wt Readings from Last 3 Encounters:  06/10/15 64.592 kg (142 lb 6.4 oz)  05/23/15 68.04 kg (150 lb)  05/11/15 68.493 kg (151 lb)    General: comfortable, looks well.   Head:  No asymmetry or swelling  Eyes:  No icterus or pallor Ears:  Not HOH  Nose:  No congestion or discharge Mouth:  Moist, clear.   Neck:  No JVD or masses, no TMG Lungs:  Dry crackles in bases, no dyspnea or cough.  Heart: RRR.  No MRG.  S1/S2 Abdomen:  Soft, NT, ND.  No mass or HSM.  Marland Kitchen  Rectal: no visible hemorrhoids, ? Small internal hemorrhoids.  No masses.  FOBT negative.    Musc/Skeltl: no joint deformity, swelling or redness Extremities:  No CCE  Neurologic:  Alert, oriented x 3.  No tremor.  No limb weakness.   Skin:  No telangectasia, rash or sores Tattoos:  none   Psych:  Cooperative, slightly anxious.    Intake/Output from previous day: 05/11 0701 - 05/12 0700 In: -  Out: 300 [Urine:300] Intake/Output this shift:    LAB RESULTS:  Recent Labs  06/09/15 0529 06/10/15 0545  WBC 8.5 7.9  HGB 14.0 13.3  HCT 43.5 40.5  PLT 234 220   BMET Lab Results  Component Value Date   NA 139 06/10/2015   NA  137 06/09/2015   NA 136 05/31/2015   K 3.3* 06/10/2015   K 4.3 06/09/2015   K 4.6 05/31/2015   CL 102 06/10/2015   CL 101 06/09/2015   CL 103 05/31/2015   CO2 26 06/10/2015   CO2 25 06/09/2015   CO2 26 05/31/2015   GLUCOSE 94 06/10/2015   GLUCOSE 96 06/09/2015   GLUCOSE 90 05/31/2015   BUN 13 06/10/2015   BUN 13 06/09/2015   BUN 12 05/31/2015   CREATININE 1.06 06/10/2015   CREATININE 0.91 06/09/2015   CREATININE 0.91 05/31/2015   CALCIUM 9.1 06/10/2015   CALCIUM 9.4 06/09/2015   CALCIUM 9.2 05/31/2015   LFT  Recent Labs  06/09/15 0529 06/10/15 0545  PROT 6.4* 6.4*  ALBUMIN 3.7 3.5  AST 23 20  ALT 22 20  ALKPHOS 59 52  BILITOT 1.3* 1.4*   PT/INR Lab Results  Component Value Date   INR 2.63* 06/10/2015   INR 2.82* 06/09/2015   INR 2.7 05/23/2015   Hepatitis Panel No results for input(s): HEPBSAG, HCVAB, HEPAIGM, HEPBIGM in the last 72 hours. C-Diff No components found for: CDIFF Lipase     Component Value Date/Time   LIPASE 33 06/09/2015 0529    Drugs of Abuse  No results found for: LABOPIA, COCAINSCRNUR, LABBENZ, AMPHETMU, THCU, LABBARB   RADIOLOGY STUDIES: Dg Chest 2 View  06/09/2015  CLINICAL DATA:  Shortness of breath for few days. EXAM: CHEST  2 VIEW COMPARISON:  Chest radiograph Jun 22, 2014 FINDINGS: The cardiac silhouette appears moderately enlarged, status post median sternotomy for CABG. Single lead LEFT cardiac defibrillator in situ. Diffuse interstitial prominence with strandy densities in the lung bases, blunting of the RIGHT costophrenic angle is compatible pleural thickening. Increased lung volumes and flattened hemidiaphragms. No pneumothorax. Soft tissue planes and included osseous structures are nonsuspicious. IMPRESSION: Similar cardiomegaly. Diffuse interstitial prominence suggesting atypical infection in a background of COPD. Electronically Signed   By: Elon Alas M.D.   On: 06/09/2015 06:04   Nm Hepatobiliary Liver  Func  06/09/2015  CLINICAL DATA:  Changes in the gallbladder suggestive of cholecystitis EXAM: NUCLEAR MEDICINE HEPATOBILIARY IMAGING TECHNIQUE: Sequential images of the abdomen were obtained out to 60 minutes following intravenous administration of radiopharmaceutical. RADIOPHARMACEUTICALS:  5.0 mCi Tc-49m Choletec IV COMPARISON:  Ultrasound from earlier in same day FINDINGS: Prompt uptake and biliary excretion of activity by the liver is seen. Gallbladder activity is visualized, consistent with patency of cystic duct. Biliary activity passes into small bowel, consistent with patent common bile duct. IMPRESSION: Normal uptake and excretion of biliary tracer. There is visualization of the gallbladder consistent with a patent cystic duct. No evidence of acute cholecystitis is noted. The changes on recent ultrasound could represent chronic cholecystitis,  possible edema from congestive failure or other etiologies. Electronically Signed   By: Inez Catalina M.D.   On: 06/09/2015 10:14   US Abdomen Limited Ruq  06/09/2015  CLINICAL DATA:  Right upper quadrant pain for 3 days EXAM: US ABDOMEN LIMITED - RIGHT UPPER QUADRANT COMPARISON:  None. FINDINGS: Gallbladder: Well distended with diffuse gallbladder wall thickening to 6-7 mm. Edema is noted within the gallbladder wall. No gallstones are seen. No pericholecystic fluid is noted. Common bile duct: Diameter: 2.4 mm. Liver: A 1.4 cm echogenic lesion is noted within the right lobe of the liver likely representing a small hemangioma. Incidental note is made of small right pleural effusion. IMPRESSION: Well distended gallbladder with diffuse gallbladder wall thickening. No cholelithiasis is noted. In the appropriate clinical setting this is consistent with acalculous cholecystitis. Small right pleural effusion. Echogenic focus within the liver likely representing small hemangioma. Electronically Signed   By: Inez Catalina M.D.   On: 06/09/2015 07:17    ENDOSCOPIC  STUDIES: EGD per HPI  IMPRESSION:   *  RUQ abd pain, resolved.  ? Acalculous cholecystitis.  ? If the doses of Rocephin vs tincture of time lead to sx resolution?   Hx well controlled GERD and currentl sxs not c/w historical GER sxs.   Pain not located in historic left sided location or characteristic of his IBS sxs.    *  Minor BPR, limited.  FOBT on admission.  FOBT now.  No visible hemorrhoids on exam.   *  CAD  *  Ischemic CM.    *  Chronic Coumadin.  Not on hold.  Therapeutic INRs.     PLAN:     *  Per Dr Joanie Coddington  06/10/2015, 10:18 AM Pager: [phone]

## 2015-06-10 NOTE — Consult Note (Signed)
   [facility] CM Inpatient Consult   06/10/2015  IGNATIUS KIFER 1940/12/13 [id number]   Patient screened for potential [facility] Management services. Patient is eligible for [facility] Care Management services under patient's Medicare  plan.  Came by to see patient and he was in a procedure.  Please place a [facility] Care Management consult or for questions contact:   Natividad Brood, RN BSN [facility] Liaison  [phone] business mobile phone Toll free office [phone]

## 2015-06-10 NOTE — Care Management Obs Status (Signed)
[facility] NOTIFICATION   Patient Details  Name: LEANDROS HELSLEY MRN: [id number] Date of Birth: January 09, 1941   Medicare Observation Status Notification Given:  Yes  [place], RN 06/10/2015, 3:29 PM

## 2015-06-10 NOTE — Progress Notes (Signed)
[facility]                                                                              Patient Demographics  [name], is a 75 y.o. male, DOB - 1941/01/22, [id number]  Admit date - 06/09/2015   Admitting Physician Jason Dickens, MD  Outpatient Primary MD for the patient is Jason Frees, MD  Outpatient specialists:   LOS - 1  days    Chief Complaint  Patient presents with  . Abdominal Pain  . Shortness of Breath       Brief summary  Patient is a 76 year old male with CABG in 1998, ischemic cardiomyopathy with chronic systolic CHF EF 0000000 and moderate MR, history of vtach/ICD, chronic epigastric abdominal pain, GERD, paroxysmal atrial fibrillation on warfarin who presented with shortness of breath for last 2-3 weeks with productive cough, yellowish sputum but no fevers or chills. 3 days. Patient also reported that for the past 3 days he had right upper quadrant abdominal pain with postprandial bloating also noted blood when wiping, has known hemorrhoids. He did report orthopnea. Chest x-ray showed diffuse interstitial prominence suggesting atypical infection in the background of COPD. Right upper quadrant ultrasound was consistent with acalculous cholecystitis however HIDA scan negative.    Assessment & Plan    Principal Problem: RUQ abdominal pain/ GERD/chronic epigastric abdominal pain- currently significant improved - Right upper quadrant ultrasound suggestive of acute atypical calculus cholecystitis. HIDA scan was negative. Surgery was consulted however does not need any urgent cholecystectomy. - Patient was placed on IV Rocephin for possible cholecystitis versus atypical pneumonia - He does have history of GERD, also possible biliary colic, consulted GI, await recommendations. 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 Patient placed on IV Lasix for diuresis - Negative balance of 550 mL, weight down from 150lbs to 142 lbs - Continue Lasix, aspirin, Imdur, lisinopril - Troponin negative, obtain 2-D echo (prior echo with caution of apical thrombus, has been anticoagulated with Coumadin)    Paroxysmal atrial fibrillation  -Currently rate controlled maintaining sinus rhythm on Tikosyn -Continue study drug. Coumadin per pharmacy   Hemorrhoids, internal -FOBT positive, GI consulted.   History of ventricular tachycardia/ ICD (implantable cardioverter-defibrillator) in place -Continue chronic magnesium   CABG '98. Low risk Myoview 11/13 -Currently asymptomatic   Hyperlipidemia LDL goal <70 -Continue Zocor   Code Status: Full code  DVT Prophylaxis:  Coumadin   Family Communication: Discussed in detail with the patient, all imaging results, lab results explained to the patient   Disposition Plan:   Time Spent in minutes  25 minutes  Procedures:  None  Consultants:   Cardiology GI  Antimicrobials:  IV Rocephin 5/11   Medications  Scheduled Meds: . aspirin  81 mg Oral Daily  .  BUC-CLIN 303 (study drug)- home med  1 tablet Oral BID  . budesonide (PULMICORT) nebulizer solution  0.25 mg Nebulization BID  . cefTRIAXone (ROCEPHIN)  IV  2 g Intravenous Q24H  . isosorbide mononitrate  15 mg Oral Daily  . lisinopril  5 mg Oral QHS  . magnesium oxide  400 mg Oral Daily  . montelukast  10 mg Oral QHS  . pantoprazole  40 mg Oral Daily  . simvastatin  40 mg Oral QHS  . sodium chloride flush  3 mL Intravenous Q12H  . spironolactone  12.5 mg Oral Daily  .  tamsulosin  0.4 mg Oral QPC supper  . warfarin  2.5 mg Oral ONCE-1800  . Warfarin - Pharmacist Dosing Inpatient   Does not apply q1800   Continuous Infusions:  PRN Meds:.acetaminophen **OR** acetaminophen, morphine injection, prochlorperazine   Antibiotics   Anti-infectives    Start     Dose/Rate Route Frequency Ordered Stop   06/10/15 0800  cefTRIAXone (ROCEPHIN) 2 g in dextrose 5 % 50 mL IVPB     2 g 100 mL/hr over 30 Minutes Intravenous Every 24 hours 06/09/15 1737     06/09/15 0830  doxycycline (VIBRAMYCIN) 100 mg in dextrose 5 % 250 mL IVPB     100 mg 125 mL/hr over 120 Minutes Intravenous  Once 06/09/15 0728 06/09/15 1059   06/09/15 0730  cefTRIAXone (ROCEPHIN) 2 g in dextrose 5 % 50 mL IVPB     2 g 100 mL/hr over 30 Minutes Intravenous  Once 06/09/15 0728 06/09/15 0812   06/09/15 0715  levofloxacin (LEVAQUIN) tablet 750 mg  Status:  Discontinued     750 mg Oral  Once 06/09/15 [id number] 06/09/15 [id number]        Subjective:   [name] was seen and examined today. Feeling somewhat better, no abdominal pain. Shortness of breath is improving. Patient denies dizziness, chest pain, N/V/D/C, new weakness, numbess, tingling. No acute events overnight.    Objective:   Filed Vitals:   06/09/15 1742 06/09/15 2206 06/10/15 0429 06/10/15 0622  BP: 100/57 90/48 104/58   Pulse: 67 90 69   Temp: 98.3 F (36.8 C) 97.9 F (36.6 C) 98.1 F (36.7 C)   TempSrc:  Oral Oral   Resp: 18 17 16    Height: 5\' 7"  (1.702 m)     Weight: 66.18 kg (145 lb 14.4 oz)   64.592 kg (142 lb 6.4 oz)  SpO2: 96% 98% 95%     Intake/Output Summary (Last 24 hours) at 06/10/15 1157 Last data filed at 06/10/15 1052  Gross per 24 hour  Intake     50 ml  Output    600 ml  Net   -550 ml     Wt Readings from Last 3 Encounters:  06/10/15 64.592 kg (142 lb 6.4 oz)  05/23/15 68.04 kg (150 lb)  05/11/15 68.493 kg (151 lb)     Exam  General: Alert and oriented x 3, NAD  HEENT:    Neck: Supple, + JVD,  no masses  Cardiovascular: S1 S2 auscultated, no rubs, murmurs or gallops. Regular rate and rhythm.  Respiratory: Bibasilar crackles   Gastrointestinal: Soft, nontender, nondistended, + bowel sounds  Ext: no cyanosis clubbing or edema  Neuro: AAOx3, Cr N's II- XII. Strength 5/5 upper and lower extremities bilaterally  Skin: No rashes  Psych: Normal affect and demeanor, alert and oriented x3    Data Reviewed:  I have personally reviewed following labs and imaging studies  Micro  Results No results found for this or any previous visit (from the past 240 hour(s)).  Radiology Reports Dg Chest 2 View  06/09/2015  CLINICAL DATA:  Shortness of breath for few days. EXAM: CHEST  2 VIEW COMPARISON:  Chest radiograph Jun 22, 2014 FINDINGS: The cardiac silhouette appears moderately enlarged, status post median sternotomy for CABG. Single lead LEFT cardiac defibrillator in situ. Diffuse interstitial prominence with strandy densities in the lung bases, blunting of the RIGHT costophrenic angle is compatible pleural thickening. Increased lung volumes and flattened hemidiaphragms. No pneumothorax. Soft tissue planes and included osseous structures are nonsuspicious. IMPRESSION: Similar cardiomegaly. Diffuse interstitial prominence suggesting atypical infection in a background of COPD. Electronically Signed   By: Elon Alas M.D.   On: 06/09/2015 06:04   Nm Hepatobiliary Liver Func  06/09/2015  CLINICAL DATA:  Changes in the gallbladder suggestive of cholecystitis EXAM: NUCLEAR MEDICINE HEPATOBILIARY IMAGING TECHNIQUE: Sequential images of the abdomen were obtained out to 60 minutes following intravenous administration of radiopharmaceutical. RADIOPHARMACEUTICALS:  5.0 mCi Tc-63m  Choletec IV COMPARISON:  Ultrasound from earlier in same day FINDINGS: Prompt uptake and biliary excretion of activity by the liver is seen. Gallbladder activity is visualized, consistent with patency of cystic duct. Biliary  activity passes into small bowel, consistent with patent common bile duct. IMPRESSION: Normal uptake and excretion of biliary tracer. There is visualization of the gallbladder consistent with a patent cystic duct. No evidence of acute cholecystitis is noted. The changes on recent ultrasound could represent chronic cholecystitis, possible edema from congestive failure or other etiologies. Electronically Signed   By: Inez Catalina M.D.   On: 06/09/2015 10:14   US Abdomen Limited Ruq  06/09/2015  CLINICAL DATA:  Right upper quadrant pain for 3 days EXAM: US ABDOMEN LIMITED - RIGHT UPPER QUADRANT COMPARISON:  None. FINDINGS: Gallbladder: Well distended with diffuse gallbladder wall thickening to 6-7 mm. Edema is noted within the gallbladder wall. No gallstones are seen. No pericholecystic fluid is noted. Common bile duct: Diameter: 2.4 mm. Liver: A 1.4 cm echogenic lesion is noted within the right lobe of the liver likely representing a small hemangioma. Incidental note is made of small right pleural effusion. IMPRESSION: Well distended gallbladder with diffuse gallbladder wall thickening. No cholelithiasis is noted. In the appropriate clinical setting this is consistent with acalculous cholecystitis. Small right pleural effusion. Echogenic focus within the liver likely representing small hemangioma. Electronically Signed   By: Inez Catalina M.D.   On: 06/09/2015 07:17    Lab Data:  CBC:  Recent Labs Lab 06/09/15 0529 06/10/15 0545  WBC 8.5 7.9  HGB 14.0 13.3  HCT 43.5 40.5  MCV 86.1 85.4  PLT 234 [id number]   Basic Metabolic Panel:  Recent Labs Lab 06/09/15 0529 06/10/15 0545  NA 137 139  K 4.3 3.3*  CL 101 102  CO2 25 26  GLUCOSE 96 94  BUN 13 13  CREATININE 0.91 1.06  CALCIUM 9.4 9.1   GFR: Estimated Creatinine Clearance: 55 mL/min (by C-G formula based on Cr of 1.06). Liver Function Tests:  Recent Labs Lab 06/09/15 0529 06/10/15 0545  AST 23 20  ALT 22 20  ALKPHOS 59 52  BILITOT  1.3* 1.4*  PROT 6.4* 6.4*  ALBUMIN 3.7 3.5    Recent Labs Lab 06/09/15 0529  LIPASE 33   No results for input(s): AMMONIA in the last 168 hours. Coagulation Profile:  Recent Labs Lab 06/09/15 0529 06/10/15 0545  INR 2.82* 2.63*   Cardiac Enzymes: No  results for input(s): CKTOTAL, CKMB, CKMBINDEX, TROPONINI in the last 168 hours. BNP (last 3 results) No results for input(s): PROBNP in the last 8760 hours. HbA1C: No results for input(s): HGBA1C in the last 72 hours. CBG: No results for input(s): GLUCAP in the last 168 hours. Lipid Profile: No results for input(s): CHOL, HDL, LDLCALC, TRIG, CHOLHDL, LDLDIRECT in the last 72 hours. Thyroid Function Tests: No results for input(s): TSH, T4TOTAL, FREET4, T3FREE, THYROIDAB in the last 72 hours. Anemia Panel: No results for input(s): VITAMINB12, FOLATE, FERRITIN, TIBC, IRON, RETICCTPCT in the last 72 hours. Urine analysis:    Component Value Date/Time   COLORURINE YELLOW 06/09/2015 0610   APPEARANCEUR CLEAR 06/09/2015 0610   LABSPEC 1.015 06/09/2015 0610   PHURINE 6.5 06/09/2015 0610   GLUCOSEU NEGATIVE 06/09/2015 0610   HGBUR NEGATIVE 06/09/2015 0610   BILIRUBINUR NEGATIVE 06/09/2015 0610   KETONESUR 15* 06/09/2015 0610   PROTEINUR NEGATIVE 06/09/2015 0610   UROBILINOGEN 0.2 05/02/2007 2207   NITRITE NEGATIVE 06/09/2015 0610   LEUKOCYTESUR TRACE* 06/09/2015 [id number]     RAI,RIPUDEEP M.D. [facility] 06/10/2015, 11:57 AM  Pager: [phone] Between 7am to 7pm - call Pager - [phone]  After 7pm go to [email or web address] - password [email or web address]  Call night coverage person covering after 7pm

## 2015-06-10 NOTE — Progress Notes (Signed)
SUBJECTIVE:  Feels much better this am.  No SOB and abdominal pain has resolved  OBJECTIVE:   Vitals:   Filed Vitals:   06/09/15 1742 06/09/15 2206 06/10/15 0429 06/10/15 0622  BP: 100/57 90/48 104/58   Pulse: 67 90 69   Temp: 98.3 F (36.8 C) 97.9 F (36.6 C) 98.1 F (36.7 C)   TempSrc:  Oral Oral   Resp: 18 17 16    Height: 5\' 7"  (1.702 m)     Weight: 145 lb 14.4 oz (66.18 kg)   142 lb 6.4 oz (64.592 kg)  SpO2: 96% 98% 95%    I&O's:   Intake/Output Summary (Last 24 hours) at 06/10/15 1119 Last data filed at 06/10/15 1052  Gross per 24 hour  Intake     50 ml  Output    600 ml  Net   -550 ml   TELEMETRY: Reviewed telemetry pt in NSR:     PHYSICAL EXAM General: Well developed, well nourished, in no acute distress Head: Eyes PERRLA, No xanthomas.   Normal cephalic and atramatic  Lungs:   Clear bilaterally to auscultation and percussion. Heart:   HRRR S1 S2 Pulses are 2+ & equal. Abdomen: Bowel sounds are positive, abdomen soft and non-tender without masses  Extremities:   No clubbing, cyanosis or edema.  DP +1 Neuro: Alert and oriented X 3. Psych:  Good affect, responds appropriately   LABS: Basic Metabolic Panel:  Recent Labs  06/09/15 0529 06/10/15 0545  NA 137 139  K 4.3 3.3*  CL 101 102  CO2 25 26  GLUCOSE 96 94  BUN 13 13  CREATININE 0.91 1.06  CALCIUM 9.4 9.1   Liver Function Tests:  Recent Labs  06/09/15 0529 06/10/15 0545  AST 23 20  ALT 22 20  ALKPHOS 59 52  BILITOT 1.3* 1.4*  PROT 6.4* 6.4*  ALBUMIN 3.7 3.5    Recent Labs  06/09/15 0529  LIPASE 33   CBC:  Recent Labs  06/09/15 0529 06/10/15 0545  WBC 8.5 7.9  HGB 14.0 13.3  HCT 43.5 40.5  MCV 86.1 85.4  PLT 234 220   Cardiac Enzymes: No results for input(s): CKTOTAL, CKMB, CKMBINDEX, TROPONINI in the last 72 hours. BNP: Invalid input(s): POCBNP D-Dimer: No results for input(s): DDIMER in the last 72 hours. Hemoglobin A1C: No results for input(s): HGBA1C in the  last 72 hours. Fasting Lipid Panel: No results for input(s): CHOL, HDL, LDLCALC, TRIG, CHOLHDL, LDLDIRECT in the last 72 hours. Thyroid Function Tests: No results for input(s): TSH, T4TOTAL, T3FREE, THYROIDAB in the last 72 hours.  Invalid input(s): FREET3 Anemia Panel: No results for input(s): VITAMINB12, FOLATE, FERRITIN, TIBC, IRON, RETICCTPCT in the last 72 hours. Coag Panel:   Lab Results  Component Value Date   INR 2.63* 06/10/2015   INR 2.82* 06/09/2015   INR 2.7 05/23/2015    RADIOLOGY: Dg Chest 2 View  06/09/2015  CLINICAL DATA:  Shortness of breath for few days. EXAM: CHEST  2 VIEW COMPARISON:  Chest radiograph Jun 22, 2014 FINDINGS: The cardiac silhouette appears moderately enlarged, status post median sternotomy for CABG. Single lead LEFT cardiac defibrillator in situ. Diffuse interstitial prominence with strandy densities in the lung bases, blunting of the RIGHT costophrenic angle is compatible pleural thickening. Increased lung volumes and flattened hemidiaphragms. No pneumothorax. Soft tissue planes and included osseous structures are nonsuspicious. IMPRESSION: Similar cardiomegaly. Diffuse interstitial prominence suggesting atypical infection in a background of COPD. Electronically Signed   By: Sandie Ano  Bloomer M.D.   On: 06/09/2015 06:04   Nm Hepatobiliary Liver Func  06/09/2015  CLINICAL DATA:  Changes in the gallbladder suggestive of cholecystitis EXAM: NUCLEAR MEDICINE HEPATOBILIARY IMAGING TECHNIQUE: Sequential images of the abdomen were obtained out to 60 minutes following intravenous administration of radiopharmaceutical. RADIOPHARMACEUTICALS:  5.0 mCi Tc-33m  Choletec IV COMPARISON:  Ultrasound from earlier in same day FINDINGS: Prompt uptake and biliary excretion of activity by the liver is seen. Gallbladder activity is visualized, consistent with patency of cystic duct. Biliary activity passes into small bowel, consistent with patent common bile duct. IMPRESSION:  Normal uptake and excretion of biliary tracer. There is visualization of the gallbladder consistent with a patent cystic duct. No evidence of acute cholecystitis is noted. The changes on recent ultrasound could represent chronic cholecystitis, possible edema from congestive failure or other etiologies. Electronically Signed   By: Inez Catalina M.D.   On: 06/09/2015 10:14   US Abdomen Limited Ruq  06/09/2015  CLINICAL DATA:  Right upper quadrant pain for 3 days EXAM: US ABDOMEN LIMITED - RIGHT UPPER QUADRANT COMPARISON:  None. FINDINGS: Gallbladder: Well distended with diffuse gallbladder wall thickening to 6-7 mm. Edema is noted within the gallbladder wall. No gallstones are seen. No pericholecystic fluid is noted. Common bile duct: Diameter: 2.4 mm. Liver: A 1.4 cm echogenic lesion is noted within the right lobe of the liver likely representing a small hemangioma. Incidental note is made of small right pleural effusion. IMPRESSION: Well distended gallbladder with diffuse gallbladder wall thickening. No cholelithiasis is noted. In the appropriate clinical setting this is consistent with acalculous cholecystitis. Small right pleural effusion. Echogenic focus within the liver likely representing small hemangioma. Electronically Signed   By: Inez Catalina M.D.   On: 06/09/2015 07:17   ASSESSMENT:PLAN:  1. Acute on chronic systolic CHF: SOB in setting of abdominal pain. He has been having episodes of abdominal distension after eating which may be resulting in decrease lung capacity and worsening his baseline DOE from his LV dysfunction.Chest xray shows diffuse interstitial prominence that was not read out as CHF but suspicious for mild acute on chronic systolic CHF exacerbation . His weight has been stable at home and he has not had any LE edema. He is very strict with following a 2gm sodium diet. He is only net neg 550cc despite IV lasix 40mg  last night but symptomatically he feels much better. Will have  device interrogated to get optivol reading to help guide further diuretic therapy.  Renal function stable. 2. Abdominal pain and distenstion.Surgery feels not acute acalculous cholecystitis and therefore no surgery indicated at this time. I am suspicious that this may be due to bowel wall edema from CHF.  It has improved with IV diuretics. 3. 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 2D echo in 2016 with ? Apical thrombus. He has been anticoagulated with coumadin as well for PAF. Continue ACE I/aldactone.  4. ASCAD s/p large QWMI 1992 with subsequent CABG in 1998 after unsuccessful PCI attempt at LAD. Last nuclear stress test 2013 showed large anterior wall scar inolving the mid to apical anterior, apical and inferoapical and apical lateral walls with no ischemia. He has not had any chest pain but has SOB which can be explained by mild CHF exacerbation. Would recommend that he get an outpt nuclear stress test to rule out ischemia as etiology of SOB.  Continue ASA/statin/long acting nitrates 5. NSVT s/p AICD 6. Dyslipidemia - continue statin  7. PAF maintaining NSR. He is on chronic anticoagulation with coumadin with therapeutic INR at 2.6.   8.  Hypokalemia - replete    Fransico Him, MD  06/10/2015  11:19 AM

## 2015-06-10 NOTE — Progress Notes (Signed)
[place] for warfarin Indication: atrial fibrillation  Allergies  Allergen Reactions  . Amoxicillin Rash  . Penicillins Rash   Labs:  Recent Labs  06/09/15 0529 06/10/15 0545  HGB 14.0 13.3  HCT 43.5 40.5  PLT 234 220  LABPROT 29.2* 27.7*  INR 2.82* 2.63*  CREATININE 0.91 1.06    Estimated Creatinine Clearance: 55 mL/min (by C-G formula based on Cr of 1.06).    Assessment: 75 yom presented to the ED with abdominal pain and SOB. HIDA scan was reported as negative so no surgical intervention planned at this time per surgery. FOB is positive but thought to be related to hemorrhoids so planning to resume coumadin.  Admit  INR is therapeutic at 2.82 and CBC is WNL.  INR today therapeutic at 2.63  Goal of Therapy:  INR 2-3 Monitor platelets by anticoagulation protocol: Yes   Plan:  - Warfarin 2.5mg  PO x 1 tonight (normal home dose) - Daily INR  Thank you Anette Guarneri, PharmD [phone] 06/10/2015,10:57 AM

## 2015-06-11 DIAGNOSIS — M11861 Other specified crystal arthropathies, right knee: Secondary | ICD-10-CM

## 2015-06-11 DIAGNOSIS — I5043 Acute on chronic combined systolic (congestive) and diastolic (congestive) heart failure: Secondary | ICD-10-CM | POA: Insufficient documentation

## 2015-06-11 DIAGNOSIS — Z8679 Personal history of other diseases of the circulatory system: Secondary | ICD-10-CM

## 2015-06-11 DIAGNOSIS — I472 Ventricular tachycardia: Secondary | ICD-10-CM | POA: Insufficient documentation

## 2015-06-11 DIAGNOSIS — E876 Hypokalemia: Secondary | ICD-10-CM | POA: Insufficient documentation

## 2015-06-11 DIAGNOSIS — Z95810 Presence of automatic (implantable) cardiac defibrillator: Secondary | ICD-10-CM

## 2015-06-11 DIAGNOSIS — I4729 Other ventricular tachycardia: Secondary | ICD-10-CM | POA: Insufficient documentation

## 2015-06-11 DIAGNOSIS — M11269 Other chondrocalcinosis, unspecified knee: Secondary | ICD-10-CM | POA: Insufficient documentation

## 2015-06-11 LAB — MAGNESIUM: Magnesium: 2 mg/dL (ref 1.7–2.4)

## 2015-06-11 LAB — PROTIME-INR
INR: 2.44 — ABNORMAL HIGH (ref 0.00–1.49)
Prothrombin Time: 26.2 seconds — ABNORMAL HIGH (ref 11.6–15.2)

## 2015-06-11 LAB — BASIC METABOLIC PANEL
ANION GAP: 12 (ref 5–15)
BUN: 16 mg/dL (ref 6–20)
CO2: 25 mmol/L (ref 22–32)
Calcium: 9.1 mg/dL (ref 8.9–10.3)
Chloride: 98 mmol/L — ABNORMAL LOW (ref 101–111)
Creatinine, Ser: 0.93 mg/dL (ref 0.61–1.24)
GFR calc Af Amer: >60 mL/min (ref >60)
GLUCOSE: 125 mg/dL — AB (ref 65–99)
POTASSIUM: 3.3 mmol/L — AB (ref 3.5–5.1)
Sodium: 135 mmol/L (ref 135–145)

## 2015-06-11 LAB — CBC
HEMATOCRIT: 40.4 % (ref 39.0–52.0)
Hemoglobin: 13.3 g/dL (ref 13.0–17.0)
MCH: 27.6 pg (ref 26.0–34.0)
MCHC: 32.9 g/dL (ref 30.0–36.0)
MCV: 83.8 fL (ref 78.0–100.0)
PLATELETS: 204 10*3/uL (ref 150–400)
RBC: 4.82 MIL/uL (ref 4.22–5.81)
RDW: 13.5 % (ref 11.5–15.5)
WBC: 12.2 10*3/uL — AB (ref 4.0–10.5)

## 2015-06-11 LAB — PROCALCITONIN: PROCALCITONIN: 0.12 ng/mL

## 2015-06-11 MED ORDER — FUROSEMIDE 10 MG/ML IJ SOLN
40.0000 mg | Freq: Two times a day (BID) | INTRAMUSCULAR | Status: AC
  Administered 2015-06-11 (×2): 40 mg via INTRAVENOUS
  Filled 2015-06-11 (×2): qty 4

## 2015-06-11 MED ORDER — PREDNISONE 20 MG PO TABS
40.0000 mg | ORAL_TABLET | Freq: Every day | ORAL | Status: DC
  Administered 2015-06-11 – 2015-06-12 (×2): 40 mg via ORAL
  Filled 2015-06-11 (×2): qty 2

## 2015-06-11 MED ORDER — WARFARIN SODIUM 5 MG PO TABS
5.0000 mg | ORAL_TABLET | Freq: Once | ORAL | Status: AC
  Administered 2015-06-11: 5 mg via ORAL
  Filled 2015-06-11: qty 1

## 2015-06-11 MED ORDER — POTASSIUM CHLORIDE CRYS ER 20 MEQ PO TBCR
40.0000 meq | EXTENDED_RELEASE_TABLET | ORAL | Status: AC
  Administered 2015-06-11 (×2): 40 meq via ORAL
  Filled 2015-06-11 (×2): qty 2

## 2015-06-11 NOTE — Progress Notes (Signed)
Patient Name: [name] Date of Encounter: 06/11/2015  Principal Problem:   RUQ abdominal pain Active Problems:   Ischemic cardiomyopathy   History of ventricular tachycardia   Asthma   GERD   ICD (implantable cardioverter-defibrillator) in place   CABG '98. Low risk Myoview 11/13   Paroxysmal atrial fibrillation (HCC)   Abdominal pain, chronic, epigastric   Hemorrhoids, internal   Hyperlipidemia LDL goal [id number]   Chronic systolic congestive heart failure, NYHA class 1 (EF 20-25%)   Productive cough   RUQ pain   Acute on chronic systolic CHF (congestive heart failure) ([place])   Acute URI   Length of Stay: 2  SUBJECTIVE  He denies dyspnea. Abdominal pain is better. Reports his dry weight at home is 143 lb. Here, 144 lb on bed scale today (reportedly 150 on admission). 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 Echo done yesterday shows evidence of elevated left heart pressures (restrictive filling) and right heart pressures (dilated IVC). His biggest complaint is severe pain and swelling in the right knee. This has happened before when hospitalized. He has had the knee drained a couple of times and has been treated with prednisone on occasion. His uric acid has been checked repeatedly and found to be normal in the past  CURRENT MEDS . aspirin  81 mg Oral Daily  . BUC-CLIN 303 (study drug)- home med  1 tablet Oral BID  . budesonide (PULMICORT) nebulizer solution  0.25 mg Nebulization BID  . cefTRIAXone (ROCEPHIN)  IV  2 g Intravenous Q24H  . isosorbide mononitrate  15 mg Oral Daily  . lisinopril  5 mg Oral QHS  . magnesium oxide  400 mg Oral Daily  . montelukast  10 mg Oral QHS  . pantoprazole  40 mg Oral Daily  . potassium chloride  40 mEq Oral Q4H  . simvastatin  40 mg Oral QHS  . sodium chloride flush  3 mL Intravenous Q12H  . spironolactone  12.5 mg Oral Daily  . tamsulosin  0.4 mg Oral QPC supper  . Warfarin -  Pharmacist Dosing Inpatient   Does not apply q1800    OBJECTIVE   Intake/Output Summary (Last 24 hours) at 06/11/15 1010 Last data filed at 06/11/15 0527  Gross per 24 hour  Intake    543 ml  Output    500 ml  Net     43 ml   Filed Weights   06/09/15 1742 06/10/15 0622 06/11/15 0427  Weight: 66.18 kg (145 lb 14.4 oz) 64.592 kg (142 lb 6.4 oz) 65.635 kg (144 lb 11.2 oz)    PHYSICAL EXAM Filed Vitals:   06/10/15 2127 06/10/15 2158 06/11/15 0427 06/11/15 0526  BP: 101/82 122/68  118/64  Pulse: 75 80  77  Temp:  97.7 F (36.5 C)  98 F (36.7 C)  TempSrc:    Oral  Resp:  18  18  Height:      Weight:   65.635 kg (144 lb 11.2 oz)   SpO2:  100%  95%   General: Alert, oriented x3, no distress Head: no evidence of trauma, PERRL, EOMI, no exophtalmos or lid lag, no myxedema, no xanthelasma; normal ears, nose and oropharynx Neck: elevated jugular venous pulsations 7-8 cm, prominent V waves and prompt hepatojugular reflux; brisk carotid pulses without delay and no carotid bruits Chest: clear to auscultation, no signs of consolidation by percussion or palpation, normal fremitus, symmetrical and  full respiratory excursions Cardiovascular: inferolaterally displaced apical impulse, regular rhythm, normal first and second heart sounds, no rubs +ve S3, 1/6 apical holosystolic murmur Abdomen: no tenderness or distention, no masses by palpation, no abnormal pulsatility or arterial bruits, normal bowel sounds, no hepatosplenomegaly Extremities: right knee is slightly warm and is swollen, with bulging fluid pockets, tender, not red; no clubbing, cyanosis or edema; 2+ radial, ulnar and brachial pulses bilaterally; 2+ right femoral, posterior tibial and dorsalis pedis pulses; 2+ left femoral, posterior tibial and dorsalis pedis pulses; no subclavian or femoral bruits Neurological: grossly nonfocal  LABS  CBC  Recent Labs  06/10/15 0545 06/11/15 0616  WBC 7.9 12.2*  HGB 13.3 13.3  HCT 40.5  40.4  MCV 85.4 83.8  PLT 220 0000000   Basic Metabolic Panel  Recent Labs  06/10/15 0545 06/11/15 0616 06/11/15 0759  NA 139 135  --   K 3.3* 3.3*  --   CL 102 98*  --   CO2 26 25  --   GLUCOSE 94 125*  --   BUN 13 16  --   CREATININE 1.06 0.93  --   CALCIUM 9.1 9.1  --   MG  --   --  2.0   Liver Function Tests  Recent Labs  06/09/15 0529 06/10/15 0545  AST 23 20  ALT 22 20  ALKPHOS 59 52  BILITOT 1.3* 1.4*  PROT 6.4* 6.4*  ALBUMIN 3.7 3.5    Recent Labs  06/09/15 0529  LIPASE 33   Cardiac Enzymes  Radiology Studies Imaging results have been reviewed and No results found.  TELE NSR with very frequent PVCs and couplets, occasional 3-6 beat NSVT, one 18-beat run at 8PM  ECG SR, atypical LBBB  ASSESSMENT AND PLAN  1. Acute on chronic combined systolic and diastolic heart failure:  Increase diuretics, but replace K. Suspect he can diurese 4-5 lb, not much more 2. CAD s/p extensive anterior MI: no angina 3. Right knee effusion: pseudogout versus hemarthrosis. Denies recent trauma. Ask Ortho to evaluate/tap. History is fairly classic for pseudogout, but he is anticoagulated. Short course of steroids? 4. PAFib: currently NSR. In bucindolol GENETIC AF trial. CHADSVasc at least 4 (age 75, CAD, CHF). 5. NSVT: prefer K >4 6. ICD: [employer] Virtuoso. I am not sure if [employer] information has been downloaded yet, will check.   Sanda Klein, MD, [facility] [phone] office [phone] pager 06/11/2015 10:10 AM

## 2015-06-11 NOTE — Progress Notes (Signed)
MD notified that pt had a 18-beat run of V-Tach with HR in the 150's. MD returned page with instructions to monitor the pt.

## 2015-06-11 NOTE — Progress Notes (Addendum)
[facility]                                                                              Patient Demographics  [name], is a 75 y.o. male, DOB - [birth year], UJW:[id number]  Admit date - 06/09/2015   Admitting Physician Jason Rocks, MD  Outpatient Primary MD for the patient is Jason Blamer, MD  Outpatient specialists:   LOS - 2  days    Chief Complaint  Patient presents with  . Abdominal Pain  . Shortness of Breath       Brief summary  Patient is a 75 year old male with CABG in 1998, ischemic cardiomyopathy with chronic systolic CHF EF 20-25% and moderate MR, history of vtach/ICD, chronic epigastric abdominal pain, GERD, paroxysmal atrial fibrillation on warfarin who presented with shortness of breath for last 2-3 weeks with productive cough, yellowish sputum but no fevers or chills. 3 days. Patient also reported that for the past 3 days he had right upper quadrant abdominal pain with postprandial bloating also noted blood when wiping, has known hemorrhoids. He did report orthopnea. Chest x-ray showed diffuse interstitial prominence suggesting atypical infection in the background of COPD. Right upper quadrant ultrasound was consistent with acalculous cholecystitis however HIDA scan negative.RUQ pain improved/resolved. New onset right knee pain on 5/13 >treating as pseudogout flare. Cardiology following.    Assessment & Plan    Principal Problem: RUQ abdominal pain/ GERD/chronic epigastric abdominal pain- currently significant improved - Right upper quadrant ultrasound suggestive of acute atypical calculus cholecystitis. HIDA scan was negative. Surgery was consulted however does not need any urgent cholecystectomy. - Patient was placed on IV Rocephin for possible cholecystitis versus atypical pneumonia - [facility] input 5/12 appreciated: DD: Secondary to acute on chronic heart failure with passive hepatic congestion, early acute cholecystitis improved  with antibiotics or even right colon diverticulitis improved with antibiotics. No role for endoscopy at this point. 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 Recommend 1 week of antibiotics, daily PPI and office follow-up in GI clinic. GI signed off - RUQ pain improved or even resolved.  Active Problems:  Asthma/Productive cough/atypical pneumonia/pneumonitis - Continue IV Rocephin for now, influenza panel negative - Respiratory virus panel pending - Pro calcitonin less than 0.1   Ischemic cardiomyopathy/Acute on Chronic combined systolic and diastolic congestive heart failure/moderate MR - 2-D echo 3/16 had shown EF of 20-25%, grade 2 diastolic dysfunction with multiple regional wall motion abnormalities - Cardiology consulted. Patient placed on IV Lasix for diuresis - +120 mL since admission. Not sure if this is accurate. - Continue Lasix, aspirin, Imdur, lisinopril - Troponin negative, repeat 2-D echo 5/12: Detailed report as below. LVEF 20%, diffuse hypokinesis, restrictive diastolic function. - As per cardiology follow-up, increasing diuretics. Suspect he can diurese 4-5 pounds.    Paroxysmal atrial fibrillation  -Currently maintaining sinus rhythm on Tikosyn -Continue study drug. Coumadin per pharmacy: INR therapeutic at 2.44 on 5/13   Hemorrhoids, internal -FOBT positive, GI consulted. No further rectal bleeding reported. GI signed off 5/12. Monitor closely.   History of ventricular tachycardia/ ICD (implantable cardioverter-defibrillator) in place - Has had asymptomatic nonsustained  VT ranging between 5 beats-18 beats. Continue monitoring on telemetry. Aggressively replace potassium to >4. Magnesium is 2. Cardiology aware.   CABG '98. Low risk Myoview 11/13 -Currently asymptomatic   Hyperlipidemia LDL goal <70 -Continue Zocor  Acute arthritis of right knee - DD: Acute pseudogout flare versus hemarthrosis in the context of anticoagulation. Patient  denies history of direct trauma. States that he has seen [name], rheumatology and [facility] in the past and has had "fluid drained" from right knee with good relief. Has had some form of surgery on right knee in the past. - Discussed at length with [facility] M.D. on call [name]: Since patient is anticoagulated, risk for bleeding from tract that will be created by needle aspiration and of introducing infection. Even if it is hemarthrosis, that by itself is likely to tamponade the bleeding. I.e. risks of arthrocentesis outweigh benefits. Hence recommend, treating for acute pseudogout with steroids and if it does not improve or worsens, then consider needle aspiration to differentiate between hemarthrosis versus pseudogout. Patient does need anticoagulation. Discussed at length with orthopedics, cardiology and patient. Started oral prednisone. Monitor closely.  Hypokalemia - Replace and follow. Magnesium 2.    Code Status: Full code  DVT Prophylaxis:  Coumadin-anticoagulated   Family Communication: Discussed in detail with the patient, all imaging results, lab results explained to the patient   Disposition Plan: DC home when medically stable  Time Spent in minutes  25 minutes  Procedures:  2-D echo: Study Conclusions  - Left ventricle: The cavity size was severely dilated. Wall  thickness was normal. The estimated ejection fraction was 20%.  Diffuse hypokinesis. Doppler parameters are consistent with  restrictive physiology, indicative of decreased left ventricular  diastolic compliance and/or increased left atrial pressure. - Aortic valve: There was no stenosis. There was mild  regurgitation. - Mitral valve: Mildly calcified annulus. Moderate central mitral  regurgitation, likely due to annular dilatation. - Left atrium: The atrium was moderately dilated. - Right ventricle: The cavity size was normal. Pacer wire or  catheter noted in right  ventricle. Systolic function was mildly  reduced. - Right atrium: The atrium was mildly dilated. - Tricuspid valve: Peak RV-RA gradient (S): 31 mm Hg. - Pulmonary arteries: PA peak pressure: 46 mm Hg (S). - Systemic veins: IVC measured 2.4 cm with < 50% respirophasic  variation, suggesting RA pressure 15 mmHg.  Impressions:  - Severely dilated LV with EF 20%. Diffuse hypokinesis. Restrictive  diastolic function. Normal RV size with mildly decreased systolic  function. Moderate central mitral regurgitation, likely due to  annular dilatation. Mild pulmonary hypertension.  Consultants:   Cardiology GI  Antimicrobials:  IV Rocephin 5/11   Medications  Scheduled Meds: . aspirin  81 mg Oral Daily  . BUC-CLIN 303 (study drug)- home med  1 tablet Oral BID  . budesonide (PULMICORT) nebulizer solution  0.25 mg Nebulization BID  . cefTRIAXone (ROCEPHIN)  IV  2 g Intravenous Q24H  . furosemide  40 mg Intravenous Q12H  . isosorbide mononitrate  15 mg Oral Daily  . lisinopril  5 mg Oral QHS  . magnesium oxide  400 mg Oral Daily  . montelukast  10 mg Oral QHS  . pantoprazole  40 mg Oral Daily  . predniSONE  40 mg Oral Q breakfast  . simvastatin  40 mg Oral QHS  . sodium chloride flush  3 mL Intravenous Q12H  . spironolactone  12.5 mg Oral Daily  . tamsulosin  0.4 mg Oral QPC  supper  . warfarin  5 mg Oral ONCE-1800  . Warfarin - Pharmacist Dosing Inpatient   Does not apply q1800   Continuous Infusions:  PRN Meds:.acetaminophen **OR** acetaminophen, morphine injection, prochlorperazine   Antibiotics   Anti-infectives    Start     Dose/Rate Route Frequency Ordered Stop   06/10/15 0800  cefTRIAXone (ROCEPHIN) 2 g in dextrose 5 % 50 mL IVPB     2 g 100 mL/hr over 30 Minutes Intravenous Every 24 hours 06/09/15 1737     06/09/15 0830  doxycycline (VIBRAMYCIN) 100 mg in dextrose 5 % 250 mL IVPB     100 mg 125 mL/hr over 120 Minutes Intravenous  Once 06/09/15 0728 06/09/15  1059   06/09/15 0730  cefTRIAXone (ROCEPHIN) 2 g in dextrose 5 % 50 mL IVPB     2 g 100 mL/hr over 30 Minutes Intravenous  Once 06/09/15 0728 06/09/15 0812   06/09/15 0715  levofloxacin (LEVAQUIN) tablet 750 mg  Status:  Discontinued     750 mg Oral  Once 06/09/15 5409 06/09/15 8119        Subjective:   Roxana Peavey RUQ abdominal pain has improved or even resolved. Since last night noticed painful swelling of right knee-states that it is similar to prior episodes of pseudogout flare. No dyspnea or chest pain or palpitations reported.    Objective:   Filed Vitals:   06/10/15 2158 06/11/15 0427 06/11/15 0526 06/11/15 1109  BP: 122/68  118/64   Pulse: 80  77 76  Temp: 97.7 F (36.5 C)  98 F (36.7 C)   TempSrc:   Oral   Resp: 18  18   Height:      Weight:  65.635 kg (144 lb 11.2 oz)    SpO2: 100%  95% 98%    Intake/Output Summary (Last 24 hours) at 06/11/15 1431 Last data filed at 06/11/15 1024  Gross per 24 hour  Intake    373 ml  Output    200 ml  Net    173 ml     Wt Readings from Last 3 Encounters:  06/11/15 65.635 kg (144 lb 11.2 oz)  05/23/15 68.04 kg (150 lb)  05/11/15 68.493 kg (151 lb)     Exam  General: Alert and oriented x 3, NAD  Neck: Supple, + JVD, no masses  Cardiovascular: S1 S2 auscultated, no rubs, murmurs or gallops. Regular rate and rhythm.Telemetry: Sinus rhythm. 2 episodes of nonsustained VT, 18 and 5 beats.   Respiratory:  clear to auscultation. No increased work of breathing.   Gastrointestinal: Soft, nontender, nondistended, + bowel sounds  Ext: no cyanosis clubbing or edema. Right knee with moderate swelling, increased warmth, painful range of movements but no tenderness. No erythema.   Neuro: AAOx3, Cr N's II- XII. Strength 5/5 upper and lower extremities bilaterally. Right lower extremity movements restricted secondary to painful knee.   Skin: No rashes  Psych: Normal affect and demeanor, alert and oriented x3    Data  Reviewed:  I have personally reviewed following labs and imaging studies  Micro Results No results found for this or any previous visit (from the past 240 hour(s)).  Radiology Reports Dg Chest 2 View  06/09/2015  CLINICAL DATA:  Shortness of breath for few days. EXAM: CHEST  2 VIEW COMPARISON:  Chest radiograph Jun 22, 2014 FINDINGS: The cardiac silhouette appears moderately enlarged, status post median sternotomy for CABG. Single lead LEFT cardiac defibrillator in situ. Diffuse interstitial prominence with strandy densities in  the lung bases, blunting of the RIGHT costophrenic angle is compatible pleural thickening. Increased lung volumes and flattened hemidiaphragms. No pneumothorax. Soft tissue planes and included osseous structures are nonsuspicious. IMPRESSION: Similar cardiomegaly. Diffuse interstitial prominence suggesting atypical infection in a background of COPD. Electronically Signed   By: Awilda Metro M.D.   On: 06/09/2015 06:04   Nm Hepatobiliary Liver Func  06/09/2015  CLINICAL DATA:  Changes in the gallbladder suggestive of cholecystitis EXAM: NUCLEAR MEDICINE HEPATOBILIARY IMAGING TECHNIQUE: Sequential images of the abdomen were obtained out to 60 minutes following intravenous administration of radiopharmaceutical. RADIOPHARMACEUTICALS:  5.0 mCi Tc-5m  Choletec IV COMPARISON:  Ultrasound from earlier in same day FINDINGS: Prompt uptake and biliary excretion of activity by the liver is seen. Gallbladder activity is visualized, consistent with patency of cystic duct. Biliary activity passes into small bowel, consistent with patent common bile duct. IMPRESSION: Normal uptake and excretion of biliary tracer. There is visualization of the gallbladder consistent with a patent cystic duct. No evidence of acute cholecystitis is noted. The changes on recent ultrasound could represent chronic cholecystitis, possible edema from congestive failure or other etiologies. Electronically Signed   By:  Alcide Clever M.D.   On: 06/09/2015 10:14   US Abdomen Limited Ruq  06/09/2015  CLINICAL DATA:  Right upper quadrant pain for 3 days EXAM: US ABDOMEN LIMITED - RIGHT UPPER QUADRANT COMPARISON:  None. FINDINGS: Gallbladder: Well distended with diffuse gallbladder wall thickening to 6-7 mm. Edema is noted within the gallbladder wall. No gallstones are seen. No pericholecystic fluid is noted. Common bile duct: Diameter: 2.4 mm. Liver: A 1.4 cm echogenic lesion is noted within the right lobe of the liver likely representing a small hemangioma. Incidental note is made of small right pleural effusion. IMPRESSION: Well distended gallbladder with diffuse gallbladder wall thickening. No cholelithiasis is noted. In the appropriate clinical setting this is consistent with acalculous cholecystitis. Small right pleural effusion. Echogenic focus within the liver likely representing small hemangioma. Electronically Signed   By: Alcide Clever M.D.   On: 06/09/2015 07:17    Lab Data:  CBC:  Recent Labs Lab 06/09/15 0529 06/10/15 0545 06/11/15 0616  WBC 8.5 7.9 12.2*  HGB 14.0 13.3 13.3  HCT 43.5 40.5 40.4  MCV 86.1 85.4 83.8  PLT 234 220 204   Basic Metabolic Panel:  Recent Labs Lab 06/09/15 0529 06/10/15 0545 06/11/15 0616 06/11/15 0759  NA 137 139 135  --   K 4.3 3.3* 3.3*  --   CL 101 102 98*  --   CO2 25 26 25   --   GLUCOSE 96 94 125*  --   BUN 13 13 16   --   CREATININE 0.91 1.06 0.93  --   CALCIUM 9.4 9.1 9.1  --   MG  --   --   --  2.0   GFR: Estimated Creatinine Clearance: 63.7 mL/min (by C-G formula based on Cr of 0.93). Liver Function Tests:  Recent Labs Lab 06/09/15 0529 06/10/15 0545  AST 23 20  ALT 22 20  ALKPHOS 59 52  BILITOT 1.3* 1.4*  PROT 6.4* 6.4*  ALBUMIN 3.7 3.5    Recent Labs Lab 06/09/15 0529  LIPASE 33   No results for input(s): AMMONIA in the last 168 hours. Coagulation Profile:  Recent Labs Lab 06/09/15 0529 06/10/15 0545 06/11/15 0616  INR  2.82* 2.63* 2.44*   Cardiac Enzymes: No results for input(s): CKTOTAL, CKMB, CKMBINDEX, TROPONINI in the last 168 hours. BNP (last 3  results) No results for input(s): PROBNP in the last 8760 hours. HbA1C: No results for input(s): HGBA1C in the last 72 hours. CBG: No results for input(s): GLUCAP in the last 168 hours. Lipid Profile: No results for input(s): CHOL, HDL, LDLCALC, TRIG, CHOLHDL, LDLDIRECT in the last 72 hours. Thyroid Function Tests: No results for input(s): TSH, T4TOTAL, FREET4, T3FREE, THYROIDAB in the last 72 hours. Anemia Panel: No results for input(s): VITAMINB12, FOLATE, FERRITIN, TIBC, IRON, RETICCTPCT in the last 72 hours. Urine analysis:    Component Value Date/Time   COLORURINE YELLOW 06/09/2015 0610   APPEARANCEUR CLEAR 06/09/2015 0610   LABSPEC 1.015 06/09/2015 0610   PHURINE 6.5 06/09/2015 0610   GLUCOSEU NEGATIVE 06/09/2015 0610   HGBUR NEGATIVE 06/09/2015 0610   BILIRUBINUR NEGATIVE 06/09/2015 0610   KETONESUR 15* 06/09/2015 0610   PROTEINUR NEGATIVE 06/09/2015 0610   UROBILINOGEN 0.2 05/02/2007 2207   NITRITE NEGATIVE 06/09/2015 0610   LEUKOCYTESUR TRACE* 06/09/2015 0610     Afsana Liera M.D. [facility] 06/11/2015, 2:31 PM  Pager: [phone] Between 7am to 7pm - call Pager - [phone]  After 7pm go to [email or web address] - password [email or web address]  Call night coverage person covering after 7pm

## 2015-06-11 NOTE — Progress Notes (Signed)
[facility] for warfarin Indication: atrial fibrillation  Allergies  Allergen Reactions  . Amoxicillin Rash  . Penicillins Rash   Labs:  Recent Labs  06/09/15 0529 06/10/15 0545 06/11/15 0616  HGB 14.0 13.3 13.3  HCT 43.5 40.5 40.4  PLT 234 220 204  LABPROT 29.2* 27.7* 26.2*  INR 2.82* 2.63* 2.44*  CREATININE 0.91 1.06 0.93    Estimated Creatinine Clearance: 63.7 mL/min (by C-G formula based on Cr of 0.93).    Assessment: 54 yom presented to the ED with abdominal pain and SOB. HIDA scan was reported as negative so no surgical intervention planned at this time per surgery. FOB is positive but thought to be related to hemorrhoids so planning to resume coumadin.  Admit  INR is therapeutic at 2.82 and CBC is WNL.  INR today therapeutic at 2.44  Goal of Therapy:  INR 2-3 Monitor platelets by anticoagulation protocol: Yes   Plan:  - Warfarin 5mg  PO x 1 tonight (normal home dose) - Daily INR  Thank you Anette Guarneri, PharmD [phone] 06/11/2015,11:22 AM

## 2015-06-11 NOTE — Progress Notes (Signed)
Notified Schorr, NP that pt having rt upper chest pain this afternoon and tonight rating it a 2-3 soreness with deep breaths. No new orders given. Will continue to monitor pt. Ranelle Oyster, RN

## 2015-06-11 NOTE — Progress Notes (Signed)
Pt had 5 beat run of vtach. Pt is asymptomatic. MD notified and wants to continue to monitor pt and notify him if pt becomes symptomatic. Cardiology on board.

## 2015-06-12 DIAGNOSIS — I472 Ventricular tachycardia: Secondary | ICD-10-CM

## 2015-06-12 LAB — BASIC METABOLIC PANEL
Anion gap: 9 (ref 5–15)
BUN: 19 mg/dL (ref 6–20)
CHLORIDE: 100 mmol/L — AB (ref 101–111)
CO2: 23 mmol/L (ref 22–32)
CREATININE: 0.89 mg/dL (ref 0.61–1.24)
Calcium: 9.2 mg/dL (ref 8.9–10.3)
GFR calc non Af Amer: >60 mL/min (ref >60)
Glucose, Bld: 100 mg/dL — ABNORMAL HIGH (ref 65–99)
POTASSIUM: 3.8 mmol/L (ref 3.5–5.1)
Sodium: 132 mmol/L — ABNORMAL LOW (ref 135–145)

## 2015-06-12 LAB — CBC
HEMATOCRIT: 40.4 % (ref 39.0–52.0)
HEMOGLOBIN: 13.6 g/dL (ref 13.0–17.0)
MCH: 28.1 pg (ref 26.0–34.0)
MCHC: 33.7 g/dL (ref 30.0–36.0)
MCV: 83.5 fL (ref 78.0–100.0)
Platelets: 202 10*3/uL (ref 150–400)
RBC: 4.84 MIL/uL (ref 4.22–5.81)
RDW: 13.5 % (ref 11.5–15.5)
WBC: 13.8 10*3/uL — ABNORMAL HIGH (ref 4.0–10.5)

## 2015-06-12 LAB — PROTIME-INR
INR: 2.66 — ABNORMAL HIGH (ref 0.00–1.49)
Prothrombin Time: 28 seconds — ABNORMAL HIGH (ref 11.6–15.2)

## 2015-06-12 MED ORDER — FUROSEMIDE 80 MG PO TABS
80.0000 mg | ORAL_TABLET | Freq: Every day | ORAL | Status: DC
  Administered 2015-06-12 – 2015-06-13 (×2): 80 mg via ORAL
  Filled 2015-06-12 (×2): qty 1

## 2015-06-12 MED ORDER — WARFARIN SODIUM 5 MG PO TABS
5.0000 mg | ORAL_TABLET | Freq: Once | ORAL | Status: AC
  Administered 2015-06-12: 5 mg via ORAL
  Filled 2015-06-12: qty 1

## 2015-06-12 NOTE — Progress Notes (Signed)
[facility]                                                                              Patient Demographics  [name], is a 75 y.o. male, DOB - [birth year], [id number]  Admit date - 06/09/2015   Admitting Physician Waldemar Dickens, MD  Outpatient Primary MD for the patient is [name], MD  Outpatient specialists:   LOS - 3  days    Chief Complaint  Patient presents with  . Abdominal Pain  . Shortness of Breath       Brief summary   Patient is a 75 year old male with CABG in 1998, ischemic cardiomyopathy with chronic systolic CHF EF 0000000 and moderate MR, history of vtach/ICD, chronic epigastric abdominal pain, GERD, paroxysmal atrial fibrillation on warfarin who presented with shortness of breath for last 2-3 weeks with productive cough, yellowish sputum but no fevers or chills. 3 days. Patient also reported that for the past 3 days he had right upper quadrant abdominal pain with postprandial bloating also noted blood when wiping, has known hemorrhoids. He did report orthopnea. Chest x-ray showed diffuse interstitial prominence suggesting atypical infection in the background of COPD. Right upper quadrant ultrasound was consistent with acalculous cholecystitis however HIDA scan negative.RUQ pain improved/resolved. New onset right knee pain on 5/13 >treating as pseudogout flare. Cardiology following   Assessment & Plan   Principal Problem: RUQ abdominal pain/ GERD/chronic epigastric abdominal pain- currently significant improved - Right upper quadrant ultrasound suggestive of acute atypical calculus cholecystitis. HIDA scan was negative. Surgery was consulted however does not need any urgent cholecystectomy. - Patient was placed on IV Rocephin for possible cholecystitis versus atypical pneumonia - [facility] input 5/12,Per recommendations symptoms possibly due to acute on chronic heart failure with passive hepatic congestion, early acute cholecystitis  improved with antibiotics or even right colon diverticulitis improved with antibiotics. No role for endoscopy at this point. 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 Recommend 1 week of antibiotics, daily PPI and office follow-up in GI clinic. GI signed off - RUQ pain improved.  Active Problems:  Asthma/Productive cough/atypical pneumonia/pneumonitis - Continue IV Rocephin for now, influenza panel negative - Respiratory virus panel pending - Pro calcitonin less than 0.1   Ischemic cardiomyopathy/Acute on Chronic combined systolic and diastolic congestive heart failure/moderate MR - 2-D echo 3/16 had shown EF of 0000000, grade 2 diastolic dysfunction with multiple regional wall motion abnormalities - Cardiology consulted. Patient placed on IV Lasix for diuresis, 2 doses yesterday, will follow cardiology recommendations regarding ongoing diuresis, continue IV versus change to oral - Negative balance of 772 mL  - Continue Lasix, aspirin, Imdur, lisinopril - Troponin negative, repeat 2-D echo 5/12: Detailed report as below. LVEF 20%, diffuse hypokinesis, restrictive diastolic function.    Paroxysmal atrial fibrillation  -Currently maintaining sinus rhythm on Tikosyn -Continue study drug. Coumadin per pharmacy   Hemorrhoids, internal - FOBT positive, GI consulted. No further rectal bleeding reported. GI signed off 5/12. Monitor closely.   History of ventricular tachycardia/ ICD (implantable cardioverter-defibrillator) in place - Has had asymptomatic nonsustained VT ranging between 5 beats-18 beats.  - Continue  monitoring on telemetry. Aggressively replace potassium to >4. Magnesium is 2. Cardiology aware.   CABG '98. Low risk Myoview 11/13 -Currently asymptomatic   Hyperlipidemia LDL goal <70 -Continue Zocor  Acute arthritis of right knee likely due to gout versus pseudogout- improving today - acute pseudogout flare versus hemarthrosis in the context of  anticoagulation. Patient denies history of direct trauma. States that he has seen Dr. Charlestine Night, rheumatology and [facility] in the past and has had "fluid drained" from right knee with good relief. Has had some form of surgery on right knee in the past. - Dr Algis Liming discussed with [facility] M.D. on call Dr. Grandville Silos: Since patient is anticoagulated, risk for bleeding from tract that will be created by needle aspiration and of introducing infection. Even if it is hemarthrosis, that by itself is likely to tamponade the bleeding. i.e. risks of arthrocentesis outweigh benefits. Hence recommend, treating for acute pseudogout with steroids and if it does not improve or worsens, then consider needle aspiration to differentiate between hemarthrosis versus pseudogout. Patient does need anticoagulation.  - Per patient improving today, swelling is significantly down and able to move his right knee. Will continue prednisone with taper.    Code Status: Full code  DVT Prophylaxis: Coumadin   Family Communication: Discussed in detail with the patient, all imaging results, lab results explained to the patient    Disposition Plan: When cleared by cardiology and able to ambulate with physical therapy  Time Spent in minutes   25 minutes Procedures:  2-D echo: Study Conclusions  - Left ventricle: The cavity size was severely dilated. Wall  thickness was normal. The estimated ejection fraction was 20%.  Diffuse hypokinesis. Doppler parameters are consistent with  restrictive physiology, indicative of decreased left ventricular  diastolic compliance and/or increased left atrial pressure. - Aortic valve: There was no stenosis. There was mild  regurgitation. - Mitral valve: Mildly calcified annulus. Moderate central mitral  regurgitation, likely due to annular dilatation. - Left atrium: The atrium was moderately dilated. - Right ventricle: The cavity size was normal. Pacer wire  or  catheter noted in right ventricle. Systolic function was mildly  reduced. - Right atrium: The atrium was mildly dilated. - Tricuspid valve: Peak RV-RA gradient (S): 31 mm Hg. - Pulmonary arteries: PA peak pressure: 46 mm Hg (S). - Systemic veins: IVC measured 2.4 cm with < 50% respirophasic  variation, suggesting RA pressure 15 mmHg.  Impressions:  - Severely dilated LV with EF 20%. Diffuse hypokinesis. Restrictive  diastolic function. Normal RV size with mildly decreased systolic  function. Moderate central mitral regurgitation, likely due to  annular dilatation. Mild pulmonary hypertension.  Consultants:  Cardiology GI Surgery  Antimicrobials:  IV Rocephin 5/11    Medications  Scheduled Meds: . aspirin  81 mg Oral Daily  . BUC-CLIN 303 (study drug)- home med  1 tablet Oral BID  . budesonide (PULMICORT) nebulizer solution  0.25 mg Nebulization BID  . cefTRIAXone (ROCEPHIN)  IV  2 g Intravenous Q24H  . isosorbide mononitrate  15 mg Oral Daily  . lisinopril  5 mg Oral QHS  . magnesium oxide  400 mg Oral Daily  . montelukast  10 mg Oral QHS  . pantoprazole  40 mg Oral Daily  . predniSONE  40 mg Oral Q breakfast  . simvastatin  40 mg Oral QHS  . sodium chloride flush  3 mL Intravenous Q12H  . spironolactone  12.5 mg Oral Daily  . tamsulosin  0.4 mg Oral QPC  supper  . Warfarin - Pharmacist Dosing Inpatient   Does not apply q1800   Continuous Infusions:  PRN Meds:.acetaminophen **OR** acetaminophen, morphine injection, prochlorperazine   Antibiotics   Anti-infectives    Start     Dose/Rate Route Frequency Ordered Stop   06/10/15 0800  cefTRIAXone (ROCEPHIN) 2 g in dextrose 5 % 50 mL IVPB     2 g 100 mL/hr over 30 Minutes Intravenous Every 24 hours 06/09/15 1737     06/09/15 0830  doxycycline (VIBRAMYCIN) 100 mg in dextrose 5 % 250 mL IVPB     100 mg 125 mL/hr over 120 Minutes Intravenous  Once 06/09/15 0728 06/09/15 1059   06/09/15 0730  cefTRIAXone  (ROCEPHIN) 2 g in dextrose 5 % 50 mL IVPB     2 g 100 mL/hr over 30 Minutes Intravenous  Once 06/09/15 0728 06/09/15 0812   06/09/15 0715  levofloxacin (LEVAQUIN) tablet 750 mg  Status:  Discontinued     750 mg Oral  Once 06/09/15 [id number] 06/09/15 [id number]        Subjective:   Juniel Bernie was seen and examined today.  Per patient, right knee is feeling better today, much less swelling and able to move his right knee.  Patient denies dizziness, chest pain, abdominal pain, N/V/D/C, new weakness, numbess, tingling. No acute events overnight.    Objective:   Filed Vitals:   06/11/15 1437 06/11/15 2115 06/12/15 0522 06/12/15 0525  BP: 90/46 100/54  105/56  Pulse: 75 74  73  Temp: 98.6 F (37 C) 97.5 F (36.4 C)  98.4 F (36.9 C)  TempSrc:      Resp: 19 18  16   Height:      Weight:   67.858 kg (149 lb 9.6 oz)   SpO2: 93% 93%  96%    Intake/Output Summary (Last 24 hours) at 06/12/15 1030 Last data filed at 06/12/15 0602  Gross per 24 hour  Intake      0 ml  Output    875 ml  Net   -875 ml     Wt Readings from Last 3 Encounters:  06/12/15 67.858 kg (149 lb 9.6 oz)  05/23/15 68.04 kg (150 lb)  05/11/15 68.493 kg (151 lb)     Exam  General: Alert and oriented x 3, NAD  HEENT:  PERRLA, EOMI, Anicteric Sclera, mucous membranes moist.   Neck: Supple, +JVD, no masses  Cardiovascular: S1 S2 auscultated, no rubs, murmurs or gallops. Regular rate and rhythm.  Respiratory: Clear to auscultation bilaterally, no wheezing, rales or rhonchi  Gastrointestinal: Soft, nontender, nondistended, + bowel sounds  Ext: no cyanosis clubbing or edema. Right knee slightly swollen compared to the left however per patient significantly improved from yesterday.   Neuro: AAOx3, Cr N's II- XII. Strength 5/5 upper and lower extremities bilaterally  Skin: No rashes  Psych: Normal affect and demeanor, alert and oriented x3    Data Reviewed:  I have personally reviewed following labs and imaging  studies  Micro Results No results found for this or any previous visit (from the past 240 hour(s)).  Radiology Reports Dg Chest 2 View  06/09/2015  CLINICAL DATA:  Shortness of breath for few days. EXAM: CHEST  2 VIEW COMPARISON:  Chest radiograph Jun 22, 2014 FINDINGS: The cardiac silhouette appears moderately enlarged, status post median sternotomy for CABG. Single lead LEFT cardiac defibrillator in situ. Diffuse interstitial prominence with strandy densities in the lung bases, blunting of the RIGHT costophrenic angle is compatible pleural  thickening. Increased lung volumes and flattened hemidiaphragms. No pneumothorax. Soft tissue planes and included osseous structures are nonsuspicious. IMPRESSION: Similar cardiomegaly. Diffuse interstitial prominence suggesting atypical infection in a background of COPD. Electronically Signed   By: Elon Alas M.D.   On: 06/09/2015 06:04   Nm Hepatobiliary Liver Func  06/09/2015  CLINICAL DATA:  Changes in the gallbladder suggestive of cholecystitis EXAM: NUCLEAR MEDICINE HEPATOBILIARY IMAGING TECHNIQUE: Sequential images of the abdomen were obtained out to 60 minutes following intravenous administration of radiopharmaceutical. RADIOPHARMACEUTICALS:  5.0 mCi Tc-79m  Choletec IV COMPARISON:  Ultrasound from earlier in same day FINDINGS: Prompt uptake and biliary excretion of activity by the liver is seen. Gallbladder activity is visualized, consistent with patency of cystic duct. Biliary activity passes into small bowel, consistent with patent common bile duct. IMPRESSION: Normal uptake and excretion of biliary tracer. There is visualization of the gallbladder consistent with a patent cystic duct. No evidence of acute cholecystitis is noted. The changes on recent ultrasound could represent chronic cholecystitis, possible edema from congestive failure or other etiologies. Electronically Signed   By: Inez Catalina M.D.   On: 06/09/2015 10:14   US Abdomen Limited  Ruq  06/09/2015  CLINICAL DATA:  Right upper quadrant pain for 3 days EXAM: US ABDOMEN LIMITED - RIGHT UPPER QUADRANT COMPARISON:  None. FINDINGS: Gallbladder: Well distended with diffuse gallbladder wall thickening to 6-7 mm. Edema is noted within the gallbladder wall. No gallstones are seen. No pericholecystic fluid is noted. Common bile duct: Diameter: 2.4 mm. Liver: A 1.4 cm echogenic lesion is noted within the right lobe of the liver likely representing a small hemangioma. Incidental note is made of small right pleural effusion. IMPRESSION: Well distended gallbladder with diffuse gallbladder wall thickening. No cholelithiasis is noted. In the appropriate clinical setting this is consistent with acalculous cholecystitis. Small right pleural effusion. Echogenic focus within the liver likely representing small hemangioma. Electronically Signed   By: Inez Catalina M.D.   On: 06/09/2015 07:17    Lab Data:  CBC:  Recent Labs Lab 06/09/15 0529 06/10/15 0545 06/11/15 0616 06/12/15 0837  WBC 8.5 7.9 12.2* 13.8*  HGB 14.0 13.3 13.3 13.6  HCT 43.5 40.5 40.4 40.4  MCV 86.1 85.4 83.8 83.5  PLT 234 220 204 [id number]   Basic Metabolic Panel:  Recent Labs Lab 06/09/15 0529 06/10/15 0545 06/11/15 0616 06/11/15 0759 06/12/15 0837  NA 137 139 135  --  132*  K 4.3 3.3* 3.3*  --  3.8  CL 101 102 98*  --  100*  CO2 25 26 25   --  23  GLUCOSE 96 94 125*  --  100*  BUN 13 13 16   --  19  CREATININE 0.91 1.06 0.93  --  0.89  CALCIUM 9.4 9.1 9.1  --  9.2  MG  --   --   --  2.0  --    GFR: Estimated Creatinine Clearance: 67 mL/min (by C-G formula based on Cr of 0.89). Liver Function Tests:  Recent Labs Lab 06/09/15 0529 06/10/15 0545  AST 23 20  ALT 22 20  ALKPHOS 59 52  BILITOT 1.3* 1.4*  PROT 6.4* 6.4*  ALBUMIN 3.7 3.5    Recent Labs Lab 06/09/15 0529  LIPASE 33   No results for input(s): AMMONIA in the last 168 hours. Coagulation Profile:  Recent Labs Lab 06/09/15 0529  06/10/15 0545 06/11/15 0616 06/12/15 0837  INR 2.82* 2.63* 2.44* 2.66*   Cardiac Enzymes: No results for input(s): CKTOTAL,  CKMB, CKMBINDEX, TROPONINI in the last 168 hours. BNP (last 3 results) No results for input(s): PROBNP in the last 8760 hours. HbA1C: No results for input(s): HGBA1C in the last 72 hours. CBG: No results for input(s): GLUCAP in the last 168 hours. Lipid Profile: No results for input(s): CHOL, HDL, LDLCALC, TRIG, CHOLHDL, LDLDIRECT in the last 72 hours. Thyroid Function Tests: No results for input(s): TSH, T4TOTAL, FREET4, T3FREE, THYROIDAB in the last 72 hours. Anemia Panel: No results for input(s): VITAMINB12, FOLATE, FERRITIN, TIBC, IRON, RETICCTPCT in the last 72 hours. Urine analysis:    Component Value Date/Time   COLORURINE YELLOW 06/09/2015 0610   APPEARANCEUR CLEAR 06/09/2015 0610   LABSPEC 1.015 06/09/2015 0610   PHURINE 6.5 06/09/2015 0610   GLUCOSEU NEGATIVE 06/09/2015 0610   HGBUR NEGATIVE 06/09/2015 0610   BILIRUBINUR NEGATIVE 06/09/2015 0610   KETONESUR 15* 06/09/2015 0610   PROTEINUR NEGATIVE 06/09/2015 0610   UROBILINOGEN 0.2 05/02/2007 2207   NITRITE NEGATIVE 06/09/2015 0610   LEUKOCYTESUR TRACE* 06/09/2015 [id number]     RAI,RIPUDEEP M.D. [facility] 06/12/2015, 10:30 AM  Pager: [phone] Between 7am to 7pm - call Pager - [phone]  After 7pm go to [email or web address] - password [email or web address]  Call night coverage person covering after 7pm

## 2015-06-12 NOTE — Progress Notes (Signed)
Patient Name: [name] Date of Encounter: 06/12/2015  Principal Problem:   RUQ abdominal pain Active Problems:   Ischemic cardiomyopathy   History of ventricular tachycardia   Asthma   GERD   ICD (implantable cardioverter-defibrillator) in place   CABG '98. Low risk Myoview 11/13   Paroxysmal atrial fibrillation (HCC)   Abdominal pain, chronic, epigastric   Hemorrhoids, internal   Hyperlipidemia LDL goal [id number]   Chronic systolic congestive heart failure, NYHA class 1 (EF 20-25%)   Productive cough   RUQ pain   Acute on chronic systolic CHF (congestive heart failure) (HCC)   Acute URI   Pseudogout of knee   NSVT (nonsustained ventricular tachycardia) (HCC)   Hypokalemia   Acute on chronic combined systolic and diastolic CHF (congestive heart failure) ([facility])   Length of Stay: 3  SUBJECTIVE  Denies dyspnea, but still coughing occasionally. Knee feels better. Weight today makes no sense and in/out clearly not accurate.  CURRENT MEDS . aspirin  81 mg Oral Daily  . BUC-CLIN 303 (study drug)- home med  1 tablet Oral BID  . budesonide (PULMICORT) nebulizer solution  0.25 mg Nebulization BID  . cefTRIAXone (ROCEPHIN)  IV  2 g Intravenous Q24H  . furosemide  80 mg Oral Daily  . isosorbide mononitrate  15 mg Oral Daily  . lisinopril  5 mg Oral QHS  . magnesium oxide  400 mg Oral Daily  . montelukast  10 mg Oral QHS  . pantoprazole  40 mg Oral Daily  . predniSONE  40 mg Oral Q breakfast  . simvastatin  40 mg Oral QHS  . sodium chloride flush  3 mL Intravenous Q12H  . spironolactone  12.5 mg Oral Daily  . tamsulosin  0.4 mg Oral QPC supper  . Warfarin - Pharmacist Dosing Inpatient   Does not apply q1800    OBJECTIVE   Intake/Output Summary (Last 24 hours) at 06/12/15 1208 Last data filed at 06/12/15 0602  Gross per 24 hour  Intake      0 ml  Output    875 ml  Net   -875 ml   Filed Weights   06/10/15 0622 06/11/15 0427 06/12/15 0522  Weight: 64.592 kg (142 lb  6.4 oz) 65.635 kg (144 lb 11.2 oz) 67.858 kg (149 lb 9.6 oz)    PHYSICAL EXAM Filed Vitals:   06/11/15 1437 06/11/15 2115 06/12/15 0522 06/12/15 0525  BP: 90/46 100/54  105/56  Pulse: 75 74  73  Temp: 98.6 F (37 C) 97.5 F (36.4 C)  98.4 F (36.9 C)  TempSrc:      Resp: 19 18  16   Height:      Weight:   67.858 kg (149 lb 9.6 oz)   SpO2: 93% 93%  96%   General: Alert, oriented x3, no distress Head: no evidence of trauma, PERRL, EOMI, no exophtalmos or lid lag, no myxedema, no xanthelasma; normal ears, nose and oropharynx Neck: elevated jugular venous pulsations 6 cm, prominent V waves and prompt hepatojugular reflux; brisk carotid pulses without delay and no carotid bruits Chest: clear to auscultation, no signs of consolidation by percussion or palpation, normal fremitus, symmetrical and full respiratory excursions Cardiovascular: inferolaterally displaced apical impulse, regular rhythm, normal first and second heart sounds, no rubs +ve S3, 1/6 apical holosystolic murmur Abdomen: no tenderness or distention, no masses by palpation, no abnormal pulsatility or arterial bruits, normal bowel sounds, no hepatosplenomegaly Extremities: right knee is slightly warm and is swollen, with bulging fluid pockets,  tender, not red; no clubbing, cyanosis or edema; 2+ radial, ulnar and brachial pulses bilaterally; 2+ right femoral, posterior tibial and dorsalis pedis pulses; 2+ left femoral, posterior tibial and dorsalis pedis pulses; no subclavian or femoral bruits Neurological: grossly nonfocal LABS  CBC  Recent Labs  06/11/15 0616 06/12/15 0837  WBC 12.2* 13.8*  HGB 13.3 13.6  HCT 40.4 40.4  MCV 83.8 83.5  PLT 204 [id number]   Basic Metabolic Panel  Recent Labs  06/11/15 0616 06/11/15 0759 06/12/15 0837  NA 135  --  132*  K 3.3*  --  3.8  CL 98*  --  100*  CO2 25  --  23  GLUCOSE 125*  --  100*  BUN 16  --  19  CREATININE 0.93  --  0.89  CALCIUM 9.1  --  9.2  MG  --  2.0  --     Liver Function Tests  Recent Labs  06/10/15 0545  AST 20  ALT 20  ALKPHOS 52  BILITOT 1.4*  PROT 6.4*  ALBUMIN 3.5    Radiology Studies Imaging results have been reviewed and No results found.  TELE NSR   ASSESSMENT AND PLAN  1. Acute on chronic combined systolic and diastolic heart failure: Improved. Suspect dry weight is closer to 139-140 lb. Switch to PO furosemide, double previous dose. 2. CAD s/p extensive anterior MI: no angina 3. Right knee effusion: pseudogout versus hemarthrosis. Denies recent trauma. History is fairly classic for pseudogout, but he is anticoagulated. Seems to be improving on steroids. 4. PAFib: currently NSR. In bucindolol GENETIC AF trial. CHADSVasc at least 4 (age 16, CAD, CHF). 5. NSVT: less prevalent last 24h; keep K >4 6. ICD: [employer] Virtuoso. Optivol  Confirms clinical impression of volume overload, with gradual creep of fluid since one month ago, already improving. He has been discussing CRT upgrade with Dr. 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   Sanda Klein, MD, [facility] [phone] office [phone] pager 06/12/2015 12:08 PM

## 2015-06-12 NOTE — Progress Notes (Signed)
[facility] for warfarin Indication: atrial fibrillation  Allergies  Allergen Reactions  . Amoxicillin Rash  . Penicillins Rash   Labs:  Recent Labs  06/10/15 0545 06/11/15 0616 06/12/15 0837  HGB 13.3 13.3 13.6  HCT 40.5 40.4 40.4  PLT 220 204 202  LABPROT 27.7* 26.2* 28.0*  INR 2.63* 2.44* 2.66*  CREATININE 1.06 0.93 0.89    Estimated Creatinine Clearance: 67 mL/min (by C-G formula based on Cr of 0.89).    Assessment: 33 yom presented to the ED with abdominal pain and SOB. HIDA scan was reported as negative so no surgical intervention planned at this time per surgery. FOB is positive but thought to be related to hemorrhoids so planning to resume coumadin.  Admit  INR is therapeutic at 2.82 and CBC is WNL.  INR today therapeutic at 2.66  Goal of Therapy:  INR 2-3 Monitor platelets by anticoagulation protocol: Yes   Plan:  - Warfarin 5mg  PO x 1 tonight (normal home dose) - Daily INR  Thank you Anette Guarneri, PharmD [phone] 06/12/2015,12:47 PM

## 2015-06-13 ENCOUNTER — Ambulatory Visit: Payer: Medicare Other | Admitting: Gastroenterology

## 2015-06-13 DIAGNOSIS — I25118 Atherosclerotic heart disease of native coronary artery with other forms of angina pectoris: Secondary | ICD-10-CM

## 2015-06-13 LAB — CBC
HCT: 40.9 % (ref 39.0–52.0)
Hemoglobin: 13.2 g/dL (ref 13.0–17.0)
MCH: 27.3 pg (ref 26.0–34.0)
MCHC: 32.3 g/dL (ref 30.0–36.0)
MCV: 84.5 fL (ref 78.0–100.0)
PLATELETS: 242 10*3/uL (ref 150–400)
RBC: 4.84 MIL/uL (ref 4.22–5.81)
RDW: 13.6 % (ref 11.5–15.5)
WBC: 11.4 10*3/uL — ABNORMAL HIGH (ref 4.0–10.5)

## 2015-06-13 LAB — BASIC METABOLIC PANEL
Anion gap: 9 (ref 5–15)
BUN: 21 mg/dL — AB (ref 6–20)
CALCIUM: 9.3 mg/dL (ref 8.9–10.3)
CO2: 28 mmol/L (ref 22–32)
CREATININE: 1.03 mg/dL (ref 0.61–1.24)
Chloride: 97 mmol/L — ABNORMAL LOW (ref 101–111)
GFR calc Af Amer: >60 mL/min (ref >60)
GLUCOSE: 91 mg/dL (ref 65–99)
Potassium: 4.3 mmol/L (ref 3.5–5.1)
SODIUM: 134 mmol/L — AB (ref 135–145)

## 2015-06-13 LAB — PROTIME-INR
INR: 3.15 — ABNORMAL HIGH (ref 0.00–1.49)
PROTHROMBIN TIME: 31.8 s — AB (ref 11.6–15.2)

## 2015-06-13 LAB — PROCALCITONIN: PROCALCITONIN: 0.14 ng/mL

## 2015-06-13 MED ORDER — PREDNISONE 10 MG PO TABS: ORAL_TABLET | ORAL | Status: DC

## 2015-06-13 MED ORDER — CIPROFLOXACIN HCL 500 MG PO TABS: 500.0000 mg | ORAL_TABLET | Freq: Two times a day (BID) | ORAL | Status: DC

## 2015-06-13 MED ORDER — METOPROLOL TARTRATE 25 MG PO TABS
25.0000 mg | ORAL_TABLET | Freq: Two times a day (BID) | ORAL | Status: DC
  Filled 2015-06-13: qty 1

## 2015-06-13 MED ORDER — LEVOFLOXACIN 750 MG PO TABS
750.0000 mg | ORAL_TABLET | ORAL | Status: DC
  Administered 2015-06-13: 750 mg via ORAL
  Filled 2015-06-13: qty 1

## 2015-06-13 MED ORDER — METOPROLOL TARTRATE 25 MG PO TABS
25.0000 mg | ORAL_TABLET | Freq: Once | ORAL | Status: AC
Start: 2015-06-13 — End: 2015-06-13
  Administered 2015-06-13: 25 mg via ORAL
  Filled 2015-06-13: qty 1

## 2015-06-13 MED ORDER — LEVOFLOXACIN 750 MG PO TABS: 750.0000 mg | ORAL_TABLET | Freq: Every day | ORAL | Status: DC

## 2015-06-13 MED ORDER — FUROSEMIDE 40 MG PO TABS: 80.0000 mg | ORAL_TABLET | Freq: Every day | ORAL | Status: DC

## 2015-06-13 MED ORDER — METOPROLOL TARTRATE 5 MG/5ML IV SOLN
5.0000 mg | Freq: Once | INTRAVENOUS | Status: AC
  Administered 2015-06-13: 5 mg via INTRAVENOUS
  Filled 2015-06-13: qty 5

## 2015-06-13 MED ORDER — PREDNISONE 20 MG PO TABS
20.0000 mg | ORAL_TABLET | Freq: Every day | ORAL | Status: AC
  Administered 2015-06-13: 20 mg via ORAL
  Filled 2015-06-13: qty 1

## 2015-06-13 NOTE — Progress Notes (Addendum)
[facility] for warfarin Indication: atrial fibrillation  Allergies  Allergen Reactions  . Amoxicillin Rash  . Penicillins Rash   Labs:  Recent Labs  06/11/15 0616 06/12/15 0837 06/13/15 0602  HGB 13.3 13.6 13.2  HCT 40.4 40.4 40.9  PLT 204 202 242  LABPROT 26.2* 28.0* 31.8*  INR 2.44* 2.66* 3.15*  CREATININE 0.93 0.89 1.03    Estimated Creatinine Clearance: 57.9 mL/min (by C-G formula based on Cr of 1.03).    Assessment: 31 yom on coumadin for afib, INR 2.66 > 3.15, CBC stable, Noted FOBT positive, likely from hemorrhoids. No further rectal bleeding reported. Plan for discharge today.  PTA dose: Take 2.5mg  on Monday, Wednesday and Friday. All other days take 5mg   Goal of Therapy:  INR 2-3 Monitor platelets by anticoagulation protocol: Yes   Plan:  - Hold coumadin today - Daily INR - Spoke with pt and family, and instructed pt to hold coumadin today, and take 2.5 mg tomorrow, then resume home regimen since he will start a course of cipro. Pt has an appointment for INR check on Friday.  Maryanna Shape, PharmD, BCPS  Clinical Pharmacist  Pager: [phone]   06/13/2015,8:50 AM

## 2015-06-13 NOTE — Progress Notes (Signed)
BP 86/54. Cardiology consulted in regards to holding Lopressor being given for tachycardia. Awaiting a response. Will continue to monitor.

## 2015-06-13 NOTE — Progress Notes (Signed)
CCMD notified of multiple runs of v-tach and running sinus tach in the 160's. MD notified. New orders placed. Will continue to monitor.

## 2015-06-13 NOTE — Discharge Summary (Addendum)
Physician Discharge Summary   Patient ID: [name] MRN: [id number] DOB/AGE: April 05, 1940 75 y.o.  Admit date: 06/09/2015 Discharge date: 06/13/2015  Primary Care Physician:  [name], MD  Discharge Diagnoses:    . RUQ abdominal pain . Acute on chronic systolic CHF (congestive heart failure) ([place]) . HLD (hyperlipidemia) . Ischemic cardiomyopathy . PAF (paroxysmal atrial fibrillation) ([place]) . ICD (implantable cardioverter-defibrillator) in place . CABG '98. Low risk Myoview 11/13 . Asthma . Paroxysmal atrial fibrillation (HCC) . Hyperlipidemia LDL goal <70 . Hemorrhoids, internal . GERD . Chronic systolic congestive heart failure, NYHA class 1 (EF 20-25%)   Consults: Cardiology Gen. Surgery GI  Recommendations for Outpatient Follow-up:   1. Please repeat CBC/BMET at next visit 2. Follow PT/INR 3. Lasix was increased to 80 mg daily, patient has cardiology appointment scheduled   DIET: Heart healthy diet    Allergies:   Allergies  Allergen Reactions  . Amoxicillin Rash  . Penicillins Rash     DISCHARGE MEDICATIONS: Current Discharge Medication List    START taking these medications   Details  levofloxacin (LEVAQUIN) 750 MG tablet Take 1 tablet (750 mg total) by mouth daily. X 1 week Qty: 7 tablet, Refills: 0    predniSONE (DELTASONE) 10 MG tablet Take Prednisone 20mg  (2 tabs) x 3 days, then 10mg  (1 tab) x 3 days, then OFF Qty: 9 tablet, Refills: 0      CONTINUE these medications which have CHANGED   Details  furosemide (LASIX) 40 MG tablet Take 2 tablets (80 mg total) by mouth daily. Qty: 60 tablet, Refills: 3   Associated Diagnoses: Chronic systolic heart failure ([place])      CONTINUE these medications which have NOT CHANGED   Details  acetaminophen (TYLENOL) 650 MG CR tablet Take 650 mg by mouth 2 (two) times daily.     AMBULATORY NON FORMULARY MEDICATION Take 1 tablet by mouth 2 (two) times daily. Medication Name: GENETIC-AF study drug  Metoprolol XL 200 mg QD vs Bucindolol 50 mg BID    aspirin 81 MG chewable tablet Chew 1 tablet (81 mg total) by mouth on Monday, Wednesday, & Friday. Qty: 12 tablet, Refills: 11   Associated Diagnoses: Cardiomyopathy, ischemic; Chronic systolic heart failure ([place]); Paroxysmal ventricular tachycardia (HCC); PAF (paroxysmal atrial fibrillation) ([place]); Medication management    cetirizine (ZYRTEC) 10 MG tablet Take 10 mg by mouth daily.      Cholecalciferol (VITAMIN D) 2000 UNITS CAPS Take 1 capsule by mouth daily.    dicyclomine (BENTYL) 10 MG/5ML syrup Take 10 mg by mouth 2 (two) times daily as needed (IBS).     glycopyrrolate (ROBINUL) 2 MG tablet Take 2 mg by mouth 2 (two) times daily as needed (stomach).   Associated Diagnoses: Cardiomyopathy, ischemic; Chronic systolic heart failure ([place]); Paroxysmal ventricular tachycardia (HCC); PAF (paroxysmal atrial fibrillation) ([place]); Medication management    isosorbide mononitrate (IMDUR) 30 MG 24 hr tablet Take 15 mg by mouth daily.   Associated Diagnoses: Atrial fibrillation with rapid ventricular response ([place]); Cardiomyopathy, ischemic    lisinopril (PRINIVIL,ZESTRIL) 5 MG tablet Take 1 tablet (5 mg total) by mouth at bedtime. Qty: 30 tablet, Refills: 1   Associated Diagnoses: PAF (paroxysmal atrial fibrillation) (HCC)    magnesium oxide (MAG-OX) 400 MG tablet Take 400 mg by mouth daily.    mometasone (ASMANEX) 220 MCG/INH inhaler Inhale 1 puff into the lungs daily.     mometasone (NASONEX) 50 MCG/ACT nasal spray Place 2 sprays into the nose daily as needed (allergies).  montelukast (SINGULAIR) 10 MG tablet Take 1 tablet (10 mg total) by mouth at bedtime. PLEASE SCHEDULE APPOINTMENT. Qty: 30 tablet, Refills: 0    nitroGLYCERIN (NITROSTAT) 0.4 MG SL tablet Place 1 tablet (0.4 mg total) under the tongue every 5 (five) minutes as needed for chest pain. Qty: 25 tablet, Refills: 1   Associated Diagnoses: Cardiomyopathy, ischemic; Chronic  systolic heart failure ([place]); Paroxysmal ventricular tachycardia (HCC); PAF (paroxysmal atrial fibrillation) ([place]); Medication management    omeprazole (PRILOSEC) 20 MG capsule Take 1 capsule (20 mg total) by mouth 2 (two) times daily before a meal. Qty: 180 capsule, Refills: 3    silodosin (RAPAFLO) 4 MG CAPS capsule Take 8 mg by mouth See admin instructions. Every 2 days    simvastatin (ZOCOR) 40 MG tablet TAKE ONE TABLET BY MOUTH AT BEDTIME Qty: 90 tablet, Refills: 3    spironolactone (ALDACTONE) 25 MG tablet Take 0.5 tablets (12.5 mg total) by mouth daily. Qty: 30 tablet, Refills: 11    warfarin (COUMADIN) 5 MG tablet Take 1 tablet by mouth daily or as directed by coumadin clinic Qty: 90 tablet, Refills: 0         Brief H and P: For complete details please refer to admission H and P, but in brief  Patient is a 75 year old male with CABG in 1998, ischemic cardiomyopathy with chronic systolic CHF EF 0000000 and moderate MR, history of vtach/ICD, chronic epigastric abdominal pain, GERD, paroxysmal atrial fibrillation on warfarin who presented with shortness of breath for last 2-3 weeks with productive cough, yellowish sputum but no fevers or chills. 3 days. Patient also reported that for the past 3 days he had right upper quadrant abdominal pain with postprandial bloating also noted blood when wiping, has known hemorrhoids. He did report orthopnea. Chest x-ray showed diffuse interstitial prominence suggesting atypical infection in the background of COPD. Right upper quadrant ultrasound was consistent with acalculous cholecystitis however HIDA scan negative.RUQ pain improved/resolved. New onset right knee pain on 5/13 >treated as pseudogout flare.   Hospital Course:  RUQ abdominal pain/ GERD/chronic epigastric abdominal pain- currently significant improved - Right upper quadrant ultrasound was suggestive of acute atypical calculus cholecystitis. HIDA scan was negative. Surgery was consulted  however does not need any urgent cholecystectomy. - Patient was placed on IV Rocephin for possible cholecystitis versus atypical pneumonia - [facility] was consulted and per recommendations symptoms possibly due to acute on chronic heart failure with passive hepatic congestion, early acute cholecystitis improved with antibiotics or even right colon diverticulitis improved with antibiotics. No role for endoscopy at this point. 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 Recommend 1 week of antibiotics, daily PPI and office follow-up in GI clinic scheduled. - RUQ pain improved, tolerating diet without any difficulty.  Active Problems:  Asthma/Productive cough/atypical pneumonia/pneumonitis - Patient was placed on IV Rocephin. Pro calcitonin less than 0.1. -P has currently improved, transitioned to oral Levaquin at discharge for 1 week   Ischemic cardiomyopathy/Acute on Chronic combined systolic and diastolic congestive heart failure/moderate MR - 2-D echo 3/16 had shown EF of 0000000, grade 2 diastolic dysfunction with multiple regional wall motion abnormalities - Cardiology was consulted. Patient placed on IV Lasix for diuresis, now transitioned to oral Lasix 80 mg daily with Aldactone - Continue Lasix, aspirin, Imdur, lisinopril - Troponin negative, repeat 2-D echo 5/12:LVEF 20%, diffuse hypokinesis, restrictive diastolic function.    Paroxysmal atrial fibrillation  -Currently maintaining sinus rhythm on Tikosyn -Continue study drug. Coumadin per pharmacy  Hemorrhoids, internal - FOBT positive, GI was consulted. No further rectal bleeding reported. GI signed off 5/12. Monitor closely.   History of ventricular tachycardia/ ICD (implantable cardioverter-defibrillator) in place - Has had asymptomatic nonsustained VT ranging between 5 beats-18 beats.  - Continue monitoring on telemetry. Aggressively replace potassium to >4. Magnesium is 2.    CABG '98. Low risk  Myoview 11/13 -Currently asymptomatic   Hyperlipidemia LDL goal <70 -Continue Zocor  Acute arthritis of right knee likely due to gout versus pseudogout- improving today - acute pseudogout flare versus hemarthrosis in the context of anticoagulation. Patient denies history of direct trauma. States that he has seen Dr. Charlestine Night, rheumatology and [facility] in the past and has had "fluid drained" from right knee with good relief. Has had some form of surgery on right knee in the past. - Dr Algis Liming discussed with [facility] M.D. on call Dr. Grandville Silos: Since patient is anticoagulated, risk for bleeding from tract that will be created by needle aspiration and of introducing infection. Even if it is hemarthrosis, that by itself is likely to tamponade the bleeding. i.e. risks of arthrocentesis outweigh benefits. Hence recommend, treating for acute pseudogout with steroids and if it does not improve or worsens, then consider needle aspiration to differentiate between hemarthrosis versus pseudogout. Patient does need anticoagulation.  - Per patient progressively improving, continue prednisone with taper.    Day of Discharge BP 100/64 mmHg  Pulse 109  Temp(Src) 97.7 F (36.5 C) (Oral)  Resp 16  Ht 5\' 7"  (1.702 m)  Wt 67.359 kg (148 lb 8 oz)  BMI 23.25 kg/m2  SpO2 96%  Physical Exam: General: Alert and awake oriented x3 not in any acute distress. HEENT: anicteric sclera, pupils reactive to light and accommodation CVS: S1-S2 clear no murmur rubs or gallops Chest: clear to auscultation bilaterally, no wheezing rales or rhonchi Abdomen: soft nontender, nondistended, normal bowel sounds Extremities: no cyanosis, clubbing or edema noted bilaterally Neuro: Cranial nerves II-XII intact, no focal neurological deficits   The results of significant diagnostics from this hospitalization (including imaging, microbiology, ancillary and laboratory) are listed below for reference.    LAB  RESULTS: Basic Metabolic Panel:  Recent Labs Lab 06/11/15 0759 06/12/15 0837 06/13/15 0602  NA  --  132* 134*  K  --  3.8 4.3  CL  --  100* 97*  CO2  --  23 28  GLUCOSE  --  100* 91  BUN  --  19 21*  CREATININE  --  0.89 1.03  CALCIUM  --  9.2 9.3  MG 2.0  --   --    Liver Function Tests:  Recent Labs Lab 06/09/15 0529 06/10/15 0545  AST 23 20  ALT 22 20  ALKPHOS 59 52  BILITOT 1.3* 1.4*  PROT 6.4* 6.4*  ALBUMIN 3.7 3.5    Recent Labs Lab 06/09/15 0529  LIPASE 33   No results for input(s): AMMONIA in the last 168 hours. CBC:  Recent Labs Lab 06/12/15 0837 06/13/15 0602  WBC 13.8* 11.4*  HGB 13.6 13.2  HCT 40.4 40.9  MCV 83.5 84.5  PLT 202 242   Cardiac Enzymes: No results for input(s): CKTOTAL, CKMB, CKMBINDEX, TROPONINI in the last 168 hours. BNP: Invalid input(s): POCBNP CBG: No results for input(s): GLUCAP in the last 168 hours.  Significant Diagnostic Studies:  Dg Chest 2 View  06/09/2015  CLINICAL DATA:  Shortness of breath for few days. EXAM: CHEST  2 VIEW COMPARISON:  Chest radiograph Jun 22, 2014 FINDINGS: The cardiac silhouette appears moderately enlarged, status post median sternotomy for CABG. Single lead LEFT cardiac defibrillator in situ. Diffuse interstitial prominence with strandy densities in the lung bases, blunting of the RIGHT costophrenic angle is compatible pleural thickening. Increased lung volumes and flattened hemidiaphragms. No pneumothorax. Soft tissue planes and included osseous structures are nonsuspicious. IMPRESSION: Similar cardiomegaly. Diffuse interstitial prominence suggesting atypical infection in a background of COPD. Electronically Signed   By: Elon Alas M.D.   On: 06/09/2015 06:04   Nm Hepatobiliary Liver Func  06/09/2015  CLINICAL DATA:  Changes in the gallbladder suggestive of cholecystitis EXAM: NUCLEAR MEDICINE HEPATOBILIARY IMAGING TECHNIQUE: Sequential images of the abdomen were obtained out to 60  minutes following intravenous administration of radiopharmaceutical. RADIOPHARMACEUTICALS:  5.0 mCi Tc-98m  Choletec IV COMPARISON:  Ultrasound from earlier in same day FINDINGS: Prompt uptake and biliary excretion of activity by the liver is seen. Gallbladder activity is visualized, consistent with patency of cystic duct. Biliary activity passes into small bowel, consistent with patent common bile duct. IMPRESSION: Normal uptake and excretion of biliary tracer. There is visualization of the gallbladder consistent with a patent cystic duct. No evidence of acute cholecystitis is noted. The changes on recent ultrasound could represent chronic cholecystitis, possible edema from congestive failure or other etiologies. Electronically Signed   By: Inez Catalina M.D.   On: 06/09/2015 10:14   US Abdomen Limited Ruq  06/09/2015  CLINICAL DATA:  Right upper quadrant pain for 3 days EXAM: US ABDOMEN LIMITED - RIGHT UPPER QUADRANT COMPARISON:  None. FINDINGS: Gallbladder: Well distended with diffuse gallbladder wall thickening to 6-7 mm. Edema is noted within the gallbladder wall. No gallstones are seen. No pericholecystic fluid is noted. Common bile duct: Diameter: 2.4 mm. Liver: A 1.4 cm echogenic lesion is noted within the right lobe of the liver likely representing a small hemangioma. Incidental note is made of small right pleural effusion. IMPRESSION: Well distended gallbladder with diffuse gallbladder wall thickening. No cholelithiasis is noted. In the appropriate clinical setting this is consistent with acalculous cholecystitis. Small right pleural effusion. Echogenic focus within the liver likely representing small hemangioma. Electronically Signed   By: Inez Catalina M.D.   On: 06/09/2015 07:17    2D ECHO: Study Conclusions  - Left ventricle: The cavity size was severely dilated. Wall  thickness was normal. The estimated ejection fraction was 20%.  Diffuse hypokinesis. Doppler parameters are consistent  with  restrictive physiology, indicative of decreased left ventricular  diastolic compliance and/or increased left atrial pressure. - Aortic valve: There was no stenosis. There was mild  regurgitation. - Mitral valve: Mildly calcified annulus. Moderate central mitral  regurgitation, likely due to annular dilatation. - Left atrium: The atrium was moderately dilated. - Right ventricle: The cavity size was normal. Pacer wire or  catheter noted in right ventricle. Systolic function was mildly  reduced. - Right atrium: The atrium was mildly dilated. - Tricuspid valve: Peak RV-RA gradient (S): 31 mm Hg. - Pulmonary arteries: PA peak pressure: 46 mm Hg (S). - Systemic veins: IVC measured 2.4 cm with < 50% respirophasic  variation, suggesting RA pressure 15 mmHg.  Impressions:  - Severely dilated LV with EF 20%. Diffuse hypokinesis. Restrictive  diastolic function. Normal RV size with mildly decreased systolic  function. Moderate central mitral regurgitation, likely due to  annular dilatation. Mild pulmonary hypertension.   Disposition and Follow-up: Discharge Instructions    Diet - low sodium heart healthy    Complete by:  As directed      Increase activity slowly    Complete by:  As directed             DISPOSITION: Home   DISCHARGE FOLLOW-UP Follow-up Information    Follow up with ZEHR, [name] D., PA-C On 06/17/2015.   Specialty:  Gastroenterology   Why:  2:30 PM.  follow up with Dr Doyne Keel PA.  replaces appointment for 5/14.     Contact information:   [place] [address] [phone]       Follow up with [name], MD On 06/24/2015.   Specialty:  Family Medicine   Why:  for hospital follow-up//  Appointment with Dr. Rockwell Germany is on 06/24/15 at 12:15   Contact information:   [place] [address] [phone]       Follow up with Tarri Fuller, PA-C On 06/28/2015.   Specialties:  Physician Assistant,  Radiology, Interventional Cardiology   Why:   11am   Contact information:   [address] [place] [address] [phone]        Time spent on Discharge: 35 minutes  Signed:   RAI,RIPUDEEP M.D. [facility] 06/13/2015, 1:39 PM Pager: [phone]

## 2015-06-13 NOTE — Progress Notes (Signed)
Subjective: No specific complaints  Objective: Vital signs in last 24 hours: Temp:  [97.7 F (36.5 C)] 97.7 F (36.5 C) (05/14 2204) Pulse Rate:  [73-109] 109 (05/15 0421) Resp:  [16-18] 16 (05/15 0421) BP: (100-103)/(57-64) 100/64 mmHg (05/15 0421) SpO2:  [94 %-96 %] 96 % (05/15 0421) Weight:  [148 lb 8 oz (67.359 kg)] 148 lb 8 oz (67.359 kg) (05/15 0508) Last BM Date: 06/12/15  Intake/Output from previous day: 05/14 0701 - 05/15 0700 In: 300 [P.O.:300] Out: -  Intake/Output this shift:    Medications Scheduled Meds: . aspirin  81 mg Oral Daily  . BUC-CLIN 303 (study drug)- home med  1 tablet Oral BID  . budesonide (PULMICORT) nebulizer solution  0.25 mg Nebulization BID  . cefTRIAXone (ROCEPHIN)  IV  2 g Intravenous Q24H  . furosemide  80 mg Oral Daily  . isosorbide mononitrate  15 mg Oral Daily  . lisinopril  5 mg Oral QHS  . magnesium oxide  400 mg Oral Daily  . montelukast  10 mg Oral QHS  . pantoprazole  40 mg Oral Daily  . simvastatin  40 mg Oral QHS  . sodium chloride flush  3 mL Intravenous Q12H  . spironolactone  12.5 mg Oral Daily  . tamsulosin  0.4 mg Oral QPC supper  . Warfarin - Pharmacist Dosing Inpatient   Does not apply q1800   Continuous Infusions:  PRN Meds:.acetaminophen **OR** acetaminophen, morphine injection, prochlorperazine  PE: General appearance: alert, cooperative and no distress Neck: no JVD Lungs: clear to auscultation bilaterally Heart: irregularly irregular rhythm and NO MRG Extremities: No LEE Pulses: 2+ and symmetric Skin: Wam and dry Neurologic: Grossly normal  Lab Results:   Recent Labs  06/11/15 0616 06/12/15 0837 06/13/15 0602  WBC 12.2* 13.8* 11.4*  HGB 13.3 13.6 13.2  HCT 40.4 40.4 40.9  PLT 204 202 242   BMET  Recent Labs  06/11/15 0616 06/12/15 0837 06/13/15 0602  NA 135 132* 134*  K 3.3* 3.8 4.3  CL 98* 100* 97*  CO2 25 23 28   GLUCOSE 125* 100* 91  BUN 16 19 21*  CREATININE 0.93 0.89 1.03    CALCIUM 9.1 9.2 9.3   PT/INR  Recent Labs  06/11/15 0616 06/12/15 0837 06/13/15 0602  LABPROT 26.2* 28.0* 31.8*  INR 2.44* 2.66* 3.15*      Assessment/Plan    Principal Problem:   RUQ abdominal pain Active Problems:   Ischemic cardiomyopathy   History of ventricular tachycardia   Asthma   GERD   ICD (implantable cardioverter-defibrillator) in place   CABG '98. Low risk Myoview 11/13   Paroxysmal atrial fibrillation (HCC)   Abdominal pain, chronic, epigastric   Hemorrhoids, internal   Hyperlipidemia LDL goal [id number]   Chronic systolic congestive heart failure, NYHA class 1 (EF 20-25%)   Productive cough   RUQ pain   Acute on chronic systolic CHF (congestive heart failure) (HCC)   Acute URI   Pseudogout of knee   NSVT (nonsustained ventricular tachycardia) (HCC)   Hypokalemia   Acute on chronic combined systolic and diastolic CHF (congestive heart failure) ([place])   1. Acute on chronic combined systolic and diastolic heart failure:  Net fluids: Not accurate. Appears euvolemic.  Weight going up ?(He's been weighed in the bed..  Dr. Sallyanne Kuster suspects dry weight is closer to 139-140 lb. On PO furosemide.  I recommend he go home on 40mg  PO daily and 40mg  PRN daily for fluid gain of 3# in 24  hrs of 5# in a week.  Also on aldactone 12.5, ACE, Imdur 15  2. CAD s/p extensive anterior MI: no angina 3. Right knee effusion: pseudogout versus hemarthrosis. Denies recent trauma. History is fairly classic for pseudogout, but he is anticoagulated. Seems to be improving on steroids. 4. PAFib:  Patient went into Afib around 0200hrs. Asymptomatic. He reports he goes in and out less frequent since being in the study.  In bucindolol GENETIC AF trial.   He has an appt on Wednesday in Afib clinic.  CHADSVasc at least 4 (age 35, CAD, CHF).  Coumadin.  Ok to DC home.   5. NSVT: less prevalent last 24h; keep K >4 6. ICD: Med[employer]onic Virtuoso. Optivol Confirms clinical impression of volume overload,  with gradual creep of fluid since one month ago, already improving. He has been discussing CRT upgrade with Dr. 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 - Per Dr. Sallyanne Kuster over the weekend, the numbers seem to be improving which is indicative of appropriate therapy. Reaching euvolemia.   FU with me on May 30 at 11am    LOS: 4 days    Tarri Fuller PA-C 06/13/2015 10:52 AM   I have seen, examined and evaluated the patient this AM along with Mr. Samara Snide on AM Rounds.  After discussing the patient, and reviewing all the available data and chart,  I agree with his findings, examination as well as impression recommendations.  He seems to be pretty much euvolemic, but has a small therapeutic window. I do agree that he probably needs to be on Lovenox extra Lasix when he was on before. Probably try 80 mg a day for the next several days and then would go to 40 mg 1-2 tablets a day as directed. Predischarge would probably make it a 40 mg tablet taking 1-2 tabs a day as directed is better than 80 mg tablet. Unfortunately last night he had recurrence of A. fib and is going low but faster than I would like. We gave him 25 mg metoprolol by mouth today and his rate has been somewhat controlled, still baseline in the high 90s to low 110s. I would like to see his rate being low but better controlled. Unfortunately he is on a study medication which I am not familiar with. Point I'm simply going to add metoprolol 25 mg twice a day for additional rate control - prefer beta blocker to calcium channel blocker with reduced ejection fraction.  Otherwise, we can get his heart rate under control, think he is probably ready for going home. Unfortunately the knee effusion and pain may be driving his heart rate low but faster. It may be best to watch him at least one more night to ensure that his rate does not go too fast and he tolerates the beta  blocker.  He will need close follow-up in atrial fibrillation clinic is able to determine the appropriate course of action for treating his paroxysmal A. fib.    Glenetta Hew, M.D., M.S. Interventional Cardiologist   Pager # [phone] Phone # [phone] [address] [place], [place] [address]

## 2015-06-13 NOTE — Care Management Note (Signed)
Case Management Note  Patient Details  Name: NIILO DARBYSHIRE MRN: [id number] Date of Birth: 06/15/1940  Subjective/Objective:                 Patient admitted with RUQ pain, and dyspnea, anticipate DC today.   Action/Plan:  Patient to DC today, No CM/ HH needs.   Expected Discharge Date:  06/12/15               Expected Discharge Plan:  Home/Self Care  In-House Referral:     Discharge planning Services  CM Consult  Post Acute Care Choice:    Choice offered to:     DME Arranged:    DME Agency:     HH Arranged:    HH Agency:     Status of Service:  Completed, signed off  Medicare Important Message Given:    Date Medicare IM Given:    Medicare IM give by:    Date Additional Medicare IM Given:    Additional Medicare Important Message give by:     If discussed at [place] of Stay Meetings, dates discussed:    Additional Comments:  Carles Collet, RN 06/13/2015, 10:54 AM

## 2015-06-14 ENCOUNTER — Encounter: Payer: Medicare Other | Admitting: Pharmacist Clinician (PhC)/ Clinical Pharmacy Specialist

## 2015-06-14 LAB — BASIC METABOLIC PANEL
ANION GAP: 14 (ref 5–15)
BUN: 19 mg/dL (ref 6–20)
CHLORIDE: 97 mmol/L — AB (ref 101–111)
CO2: 24 mmol/L (ref 22–32)
Calcium: 9.2 mg/dL (ref 8.9–10.3)
Creatinine, Ser: 0.99 mg/dL (ref 0.61–1.24)
GFR calc non Af Amer: >60 mL/min (ref >60)
Glucose, Bld: 89 mg/dL (ref 65–99)
Potassium: 3.5 mmol/L (ref 3.5–5.1)
Sodium: 135 mmol/L (ref 135–145)

## 2015-06-14 LAB — PROTIME-INR
INR: 2.72 — ABNORMAL HIGH (ref 0.00–1.49)
Prothrombin Time: 28.4 seconds — ABNORMAL HIGH (ref 11.6–15.2)

## 2015-06-14 LAB — MAGNESIUM: MAGNESIUM: 2.1 mg/dL (ref 1.7–2.4)

## 2015-06-14 MED ORDER — LISINOPRIL 5 MG PO TABS: 2.5000 mg | ORAL_TABLET | Freq: Every day | ORAL | Status: DC

## 2015-06-14 MED ORDER — FUROSEMIDE 40 MG PO TABS: 40.0000 mg | ORAL_TABLET | Freq: Every day | ORAL | Status: DC

## 2015-06-14 MED ORDER — LEVOFLOXACIN 750 MG PO TABS: 750.0000 mg | ORAL_TABLET | Freq: Every day | ORAL | Status: DC

## 2015-06-14 MED ORDER — WARFARIN SODIUM 2.5 MG PO TABS: 2.5000 mg | ORAL_TABLET | Freq: Once | ORAL | Status: DC

## 2015-06-14 MED ORDER — SPIRONOLACTONE 25 MG PO TABS: 12.5000 mg | ORAL_TABLET | Freq: Every day | ORAL | Status: DC

## 2015-06-14 MED ORDER — FUROSEMIDE 40 MG PO TABS
40.0000 mg | ORAL_TABLET | Freq: Every day | ORAL | Status: DC
Start: 2015-06-15 — End: 2015-07-12

## 2015-06-14 MED ORDER — LISINOPRIL 2.5 MG PO TABS: 2.5000 mg | ORAL_TABLET | Freq: Every day | ORAL | Status: DC

## 2015-06-14 MED FILL — Perflutren Lipid Microsphere IV Susp 1.1 MG/ML: INTRAVENOUS | Qty: 10 | Status: AC

## 2015-06-14 MED FILL — levoFLOXacin 750 MG TABS: 750 | 4 days supply | Qty: 4 | Fill #0

## 2015-06-14 NOTE — Discharge Summary (Signed)
Physician Discharge Summary   Patient ID: [name] MRN: [id number] DOB/AGE: 03/01/1940 75 y.o.  Admit date: 06/09/2015 Discharge date: 06/14/2015  Primary Care Physician:  [name], MD  Discharge Diagnoses:    . RUQ abdominal pain . Acute on chronic systolic CHF (congestive heart failure) (HCC) . HLD (hyperlipidemia) . Ischemic cardiomyopathy . PAF (paroxysmal atrial fibrillation) (HCC) . ICD (implantable cardioverter-defibrillator) in place . CABG '98. Low risk Myoview 11/13 . Asthma . Paroxysmal atrial fibrillation (HCC) . Hyperlipidemia LDL goal <70 . Hemorrhoids, internal . GERD . Chronic systolic congestive heart failure, NYHA class 1 (EF 20-25%)   Consults: Cardiology Gen. Surgery GI  Recommendations for Outpatient Follow-up:   1. Please repeat CBC/BMET at next visit 2. Follow PT/INR   DIET: Heart healthy diet    Allergies:   Allergies  Allergen Reactions  . Amoxicillin Rash  . Penicillins Rash     DISCHARGE MEDICATIONS: Current Discharge Medication List    START taking these medications   Details  levofloxacin (LEVAQUIN) 750 MG tablet Take 1 tablet (750 mg total) by mouth daily. X 4days Qty: 4 tablet, Refills: 0      CONTINUE these medications which have CHANGED   Details  furosemide (LASIX) 40 MG tablet Take 1 tablet (40 mg total) by mouth daily. Take 40 mg daily, additional 40 mg if weight gain of >3lbs in 24hrs or >5lbs in 1 week Qty: 60 tablet, Refills: 3    lisinopril (PRINIVIL,ZESTRIL) 2.5 MG tablet Take 1 tablet (2.5 mg total) by mouth at bedtime. Qty: 30 tablet, Refills: 1   Associated Diagnoses: PAF (paroxysmal atrial fibrillation) (HCC)    spironolactone (ALDACTONE) 25 MG tablet Take 0.5 tablets (12.5 mg total) by mouth daily. Qty: 30 tablet, Refills: 11      CONTINUE these medications which have NOT CHANGED   Details  acetaminophen (TYLENOL) 650 MG CR tablet Take 650 mg by mouth 2 (two) times daily.      AMBULATORY NON FORMULARY MEDICATION Take 1 tablet by mouth 2 (two) times daily. Medication Name: GENETIC-AF study drug Metoprolol XL 200 mg QD vs Bucindolol 50 mg BID    aspirin 81 MG chewable tablet Chew 1 tablet (81 mg total) by mouth on Monday, Wednesday, & Friday. Qty: 12 tablet, Refills: 11   Associated Diagnoses: Cardiomyopathy, ischemic; Chronic systolic heart failure (HCC); Paroxysmal ventricular tachycardia (HCC); PAF (paroxysmal atrial fibrillation) (HCC); Medication management    cetirizine (ZYRTEC) 10 MG tablet Take 10 mg by mouth daily.      Cholecalciferol (VITAMIN D) 2000 UNITS CAPS Take 1 capsule by mouth daily.    dicyclomine (BENTYL) 10 MG/5ML syrup Take 10 mg by mouth 2 (two) times daily as needed (IBS).     glycopyrrolate (ROBINUL) 2 MG tablet Take 2 mg by mouth 2 (two) times daily as needed (stomach).   Associated Diagnoses: Cardiomyopathy, ischemic; Chronic systolic heart failure (HCC); Paroxysmal ventricular tachycardia (HCC); PAF (paroxysmal atrial fibrillation) (HCC); Medication management    isosorbide mononitrate (IMDUR) 30 MG 24 hr tablet Take 15 mg by mouth daily.   Associated Diagnoses: Atrial fibrillation with rapid ventricular response (HCC); Cardiomyopathy, ischemic    magnesium oxide (MAG-OX) 400 MG tablet Take 400 mg by mouth daily.    mometasone (ASMANEX) 220 MCG/INH inhaler Inhale 1 puff into the lungs daily.     mometasone (NASONEX) 50 MCG/ACT nasal spray Place 2 sprays into the nose daily as needed (allergies).     montelukast (SINGULAIR) 10 MG tablet Take 1 tablet (10  mg total) by mouth at bedtime. PLEASE SCHEDULE APPOINTMENT. Qty: 30 tablet, Refills: 0    nitroGLYCERIN (NITROSTAT) 0.4 MG SL tablet Place 1 tablet (0.4 mg total) under the tongue every 5 (five) minutes as needed for chest pain. Qty: 25 tablet, Refills: 1   Associated Diagnoses: Cardiomyopathy, ischemic; Chronic systolic heart failure (HCC); Paroxysmal ventricular tachycardia (HCC);  PAF (paroxysmal atrial fibrillation) (HCC); Medication management    omeprazole (PRILOSEC) 20 MG capsule Take 1 capsule (20 mg total) by mouth 2 (two) times daily before a meal. Qty: 180 capsule, Refills: 3    silodosin (RAPAFLO) 4 MG CAPS capsule Take 8 mg by mouth See admin instructions. Every 2 days    simvastatin (ZOCOR) 40 MG tablet TAKE ONE TABLET BY MOUTH AT BEDTIME Qty: 90 tablet, Refills: 3    warfarin (COUMADIN) 5 MG tablet Take 1 tablet by mouth daily or as directed by coumadin clinic Qty: 90 tablet, Refills: 0         Brief H and P: For complete details please refer to admission H and P, but in brief  Patient is a 75 year old male with CABG in 1998, ischemic cardiomyopathy with chronic systolic CHF EF 20-25% and moderate MR, history of vtach/ICD, chronic epigastric abdominal pain, GERD, paroxysmal atrial fibrillation on warfarin who presented with shortness of breath for last 2-3 weeks with productive cough, yellowish sputum but no fevers or chills. 3 days. Patient also reported that for the past 3 days he had right upper quadrant abdominal pain with postprandial bloating also noted blood when wiping, has known hemorrhoids. He did report orthopnea. Chest x-ray showed diffuse interstitial prominence suggesting atypical infection in the background of COPD. Right upper quadrant ultrasound was consistent with acalculous cholecystitis however HIDA scan negative.RUQ pain improved/resolved. New onset right knee pain on 5/13 >treated as pseudogout flare.   Hospital Course:  RUQ abdominal pain/ GERD/chronic epigastric abdominal pain- currently significant improved - Right upper quadrant ultrasound was suggestive of acute atypical calculus cholecystitis. HIDA scan was negative. Surgery was consulted however does not need any urgent cholecystectomy. - Patient was placed on IV Rocephin for possible cholecystitis versus atypical pneumonia - [facility] was consulted and per recommendations  symptoms possibly due to acute on chronic heart failure with passive hepatic congestion, early acute cholecystitis improved with antibiotics or even right colon diverticulitis improved with antibiotics. No role for endoscopy at this point. 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 Recommend total 1 week of antibiotics, daily PPI and office follow-up in GI clinic scheduled. - RUQ pain improved, tolerating diet without any difficulty.   Asthma/Productive cough/atypical pneumonia/pneumonitis - Patient was placed on IV Rocephin. Pro calcitonin less than 0.1. Now transitioned to oral Levaquin   Ischemic cardiomyopathy/Acute on Chronic combined systolic and diastolic congestive heart failure/moderate MR - 2-D echo 3/16 had shown EF of 20-25%, grade 2 diastolic dysfunction with multiple regional wall motion abnormalities - Cardiology was consulted. Patient placed on IV Lasix for diuresis, then transitioned to oral Lasix. Continue Aldactone - Troponin negative, repeat 2-D echo 5/12:LVEF 20%, diffuse hypokinesis, restrictive diastolic function. Discussed in detail with Dr. Herbie Baltimore, cardiologyprior to discharge, recommended Lasix 40 mg daily and additional 40 mg as needed with weight parameters. Beta blocker was discontinued due to hypotension. Lisinopril was decreased to 2.5 mg daily to restart on 5/18   Paroxysmal atrial fibrillation with RVR -Currently maintaining sinus rhythm -Continue study drug.  - Patient was noted to be in A. fib with RVR on 5/15, was  placed on beta blocker however BP trended low, hence discontinued. He may need cardioversion, decision to be at follow-up outpatient.    Hemorrhoids, internal - FOBT positive, GI was consulted. No further rectal bleeding reported. GI signed off 5/12, no GI interventions at this time.   History of ventricular tachycardia/ ICD (implantable cardioverter-defibrillator) in place - Has had asymptomatic nonsustained VT ranging  between 5 beats-18 beats.  - Continue monitoring on telemetry. Aggressively replace potassium to >4. Magnesium is 2.    CABG '98. Low risk Myoview 11/13 -Currently asymptomatic   Hyperlipidemia LDL goal <70 -Continue Zocor  Acute arthritis of right knee likely due to gout versus pseudogout-significantly improved - acute pseudogout flare versus hemarthrosis in the context of anticoagulation. Patient denies history of direct trauma. States that he has seen Dr. Kellie Simmering, rheumatology and [facility] in the past and has had "fluid drained" from right knee with good relief. Has had some form of surgery on right knee in the past. - Dr Waymon Amato discussed with [facility] M.D. on call Dr. Janee Morn: Since patient is anticoagulated, risk for bleeding from tract that will be created by needle aspiration and of introducing infection. Even if it is hemarthrosis, that by itself is likely to tamponade the bleeding. i.e. risks of arthrocentesis outweigh benefits. Hence recommend, treating for acute pseudogout with steroids and if it does not improve or worsens, then consider needle aspiration to differentiate between hemarthrosis versus pseudogout. Patient does need anticoagulation.  - Patient's symptoms have improved, he does not want to be on prednisone anymore. Prednisone was discontinued   Day of Discharge BP 96/69 mmHg  Pulse 101  Temp(Src) 97.9 F (36.6 C) (Oral)  Resp 18  Ht 5\' 7"  (1.702 m)  Wt 68.1 kg (150 lb 2.1 oz)  BMI 23.51 kg/m2  SpO2 97%  Physical Exam: General: Alert and awake oriented x3 not in any acute distress. HEENT: anicteric sclera, pupils reactive to light and accommodation CVS: S1-S2 clear no murmur rubs or gallops Chest: clear to auscultation bilaterally, no wheezing rales or rhonchi Abdomen: soft nontender, nondistended, normal bowel sounds Extremities: no cyanosis, clubbing or edema noted bilaterally Neuro: Cranial nerves II-XII intact, no focal  neurological deficits   The results of significant diagnostics from this hospitalization (including imaging, microbiology, ancillary and laboratory) are listed below for reference.    LAB RESULTS: Basic Metabolic Panel:  Recent Labs Lab 06/13/15 0602 06/14/15 0703  NA 134* 135  K 4.3 3.5  CL 97* 97*  CO2 28 24  GLUCOSE 91 89  BUN 21* 19  CREATININE 1.03 0.99  CALCIUM 9.3 9.2  MG  --  2.1   Liver Function Tests:  Recent Labs Lab 06/09/15 0529 06/10/15 0545  AST 23 20  ALT 22 20  ALKPHOS 59 52  BILITOT 1.3* 1.4*  PROT 6.4* 6.4*  ALBUMIN 3.7 3.5    Recent Labs Lab 06/09/15 0529  LIPASE 33   No results for input(s): AMMONIA in the last 168 hours. CBC:  Recent Labs Lab 06/12/15 0837 06/13/15 0602  WBC 13.8* 11.4*  HGB 13.6 13.2  HCT 40.4 40.9  MCV 83.5 84.5  PLT 202 242   Cardiac Enzymes: No results for input(s): CKTOTAL, CKMB, CKMBINDEX, TROPONINI in the last 168 hours. BNP: Invalid input(s): POCBNP CBG: No results for input(s): GLUCAP in the last 168 hours.  Significant Diagnostic Studies:  Dg Chest 2 View  06/09/2015  CLINICAL DATA:  Shortness of breath for few days. EXAM: CHEST  2 VIEW COMPARISON:  Chest radiograph Jun 22, 2014 FINDINGS: The cardiac silhouette appears moderately enlarged, status post median sternotomy for CABG. Single lead LEFT cardiac defibrillator in situ. Diffuse interstitial prominence with strandy densities in the lung bases, blunting of the RIGHT costophrenic angle is compatible pleural thickening. Increased lung volumes and flattened hemidiaphragms. No pneumothorax. Soft tissue planes and included osseous structures are nonsuspicious. IMPRESSION: Similar cardiomegaly. Diffuse interstitial prominence suggesting atypical infection in a background of COPD. Electronically Signed   By: Awilda Metro M.D.   On: 06/09/2015 06:04   Nm Hepatobiliary Liver Func  06/09/2015  CLINICAL DATA:  Changes in the gallbladder suggestive of  cholecystitis EXAM: NUCLEAR MEDICINE HEPATOBILIARY IMAGING TECHNIQUE: Sequential images of the abdomen were obtained out to 60 minutes following intravenous administration of radiopharmaceutical. RADIOPHARMACEUTICALS:  5.0 mCi Tc-10m  Choletec IV COMPARISON:  Ultrasound from earlier in same day FINDINGS: Prompt uptake and biliary excretion of activity by the liver is seen. Gallbladder activity is visualized, consistent with patency of cystic duct. Biliary activity passes into small bowel, consistent with patent common bile duct. IMPRESSION: Normal uptake and excretion of biliary tracer. There is visualization of the gallbladder consistent with a patent cystic duct. No evidence of acute cholecystitis is noted. The changes on recent ultrasound could represent chronic cholecystitis, possible edema from congestive failure or other etiologies. Electronically Signed   By: Alcide Clever M.D.   On: 06/09/2015 10:14   US Abdomen Limited Ruq  06/09/2015  CLINICAL DATA:  Right upper quadrant pain for 3 days EXAM: US ABDOMEN LIMITED - RIGHT UPPER QUADRANT COMPARISON:  None. FINDINGS: Gallbladder: Well distended with diffuse gallbladder wall thickening to 6-7 mm. Edema is noted within the gallbladder wall. No gallstones are seen. No pericholecystic fluid is noted. Common bile duct: Diameter: 2.4 mm. Liver: A 1.4 cm echogenic lesion is noted within the right lobe of the liver likely representing a small hemangioma. Incidental note is made of small right pleural effusion. IMPRESSION: Well distended gallbladder with diffuse gallbladder wall thickening. No cholelithiasis is noted. In the appropriate clinical setting this is consistent with acalculous cholecystitis. Small right pleural effusion. Echogenic focus within the liver likely representing small hemangioma. Electronically Signed   By: Alcide Clever M.D.   On: 06/09/2015 07:17    2D ECHO: Study Conclusions  - Left ventricle: The cavity size was severely dilated.  Wall  thickness was normal. The estimated ejection fraction was 20%.  Diffuse hypokinesis. Doppler parameters are consistent with  restrictive physiology, indicative of decreased left ventricular  diastolic compliance and/or increased left atrial pressure. - Aortic valve: There was no stenosis. There was mild  regurgitation. - Mitral valve: Mildly calcified annulus. Moderate central mitral  regurgitation, likely due to annular dilatation. - Left atrium: The atrium was moderately dilated. - Right ventricle: The cavity size was normal. Pacer wire or  catheter noted in right ventricle. Systolic function was mildly  reduced. - Right atrium: The atrium was mildly dilated. - Tricuspid valve: Peak RV-RA gradient (S): 31 mm Hg. - Pulmonary arteries: PA peak pressure: 46 mm Hg (S). - Systemic veins: IVC measured 2.4 cm with < 50% respirophasic  variation, suggesting RA pressure 15 mmHg.  Impressions:  - Severely dilated LV with EF 20%. Diffuse hypokinesis. Restrictive  diastolic function. Normal RV size with mildly decreased systolic  function. Moderate central mitral regurgitation, likely due to  annular dilatation. Mild pulmonary hypertension.   Disposition and Follow-up: Discharge Instructions    Diet - low sodium heart healthy  Complete by:  As directed      Discharge instructions    Complete by:  As directed   Please hold lisinopril and Aldactone for 2 days. Continue Lasix 40 mg daily and additional 40 mg Lasix if weight gain of more than 3lbs in 24 hours and more than 5 lbs in 1 week.  Please take Coumadin 2.5 mg today.     Increase activity slowly    Complete by:  As directed             DISPOSITION: Home   DISCHARGE FOLLOW-UP Follow-up Information    Follow up with ZEHR, [name] D., PA-C On 06/17/2015.   Specialty:  Gastroenterology   Why:  2:30 PM.  follow up with Dr Lanetta Inch PA.  replaces appointment for 5/14.     Contact information:   [address] [place] [address] [phone]       Follow up with [name], MD On 06/24/2015.   Specialty:  Family Medicine   Why:  for hospital follow-up//  Appointment with Dr. Erling Conte is on 06/24/15 at 12:15   Contact information:   [address] [place] [address] [phone]       Follow up with Wilburt Finlay, PA-C On 06/28/2015.   Specialties:  Physician Assistant, Radiology, Interventional Cardiology   Why:   11am   Contact information:   [address] [place] [address] [phone]       Follow up with [facility].   Why:  RN to follow for cardiac symptom and medication management   Contact information:   [address] [place] [address] [phone]       Follow up with [facility].   Why:  [facility] Case Management to follow for community care for cardiac patient.      Follow up with [facility].   Why:  rolator to be delivered to room prior to discharge   Contact information:   [address] [place] [address] [phone]       Follow up with Bryan Lemma, MD.   Specialty:  Cardiology   Why:  Office will call you regarding possible cardioversion if needed   Contact information:   [address] [place] [address] [phone]        Time spent on Discharge: 35 minutes  Signed:   Dekota Shenk M.D. [facility] 06/14/2015, 3:04 PM Pager: [phone]

## 2015-06-14 NOTE — Care Management Note (Signed)
Case Management Note  Patient Details  Name: [name] MRN: [id number] Date of Birth: 1940-10-07  Subjective/Objective:                 Patient with tachycardia, (Afib RVR), hypotention, cardiology adj meds, anticipate DC to home today with spouse. CM spoke to patient and wife at bedside to discuss [facility] RN. They would like [facility], as they have used them in the past. CM also spoke to patient about [facility] referral, they agreed to that well, and were familiar with their services.    Action/Plan:  Referral to [facility] CM placed, and referral to [facility] placed for [facility] RN. Order requested from Dr. Tana Coast.  Expected Discharge Date:  06/12/15               Expected Discharge Plan:  [place]  In-House Referral:     Discharge planning Services  CM Consult  Post Acute Care Choice:  Home Health Choice offered to:  Patient, Spouse  DME Arranged:    DME Agency:     HH Arranged:  RN [place] Agency:  [place]  Status of Service:  Completed, signed off  Medicare Important Message Given:    Date Medicare IM Given:    Medicare IM give by:    Date Additional Medicare IM Given:    Additional Medicare Important Message give by:     If discussed at [place] of Stay Meetings, dates discussed:    Additional Comments:  Carles Collet, RN 06/14/2015, 11:30 AM

## 2015-06-14 NOTE — Progress Notes (Signed)
CCMD notified RN that patient is having multiple 2-3 beat runs of vtach. On-call MD notified. Will continue to monitor.

## 2015-06-14 NOTE — Discharge Summary (Signed)
[name] to be D/C'd Home per MD order. Discussed with the patient and all questions fully answered.    Medication List    TAKE these medications        acetaminophen 650 MG CR tablet  Commonly known as:  TYLENOL  Take 650 mg by mouth 2 (two) times daily.     AMBULATORY NON FORMULARY MEDICATION  Take 1 tablet by mouth 2 (two) times daily. Medication Name: GENETIC-AF study drug Metoprolol XL 200 mg QD vs Bucindolol 50 mg BID     aspirin 81 MG chewable tablet  Chew 1 tablet (81 mg total) by mouth on Monday, Wednesday, & Friday.     cetirizine 10 MG tablet  Commonly known as:  ZYRTEC  Take 10 mg by mouth daily.     dicyclomine 10 MG/5ML syrup  Commonly known as:  BENTYL  Take 10 mg by mouth 2 (two) times daily as needed (IBS).     furosemide 40 MG tablet  Commonly known as:  LASIX  Take 1 tablet (40 mg total) by mouth daily. Take 40 mg daily, additional 40 mg if weight gain of >3lbs in 24hrs or >5lbs in 1 week  Start taking on:  06/15/2015     glycopyrrolate 2 MG tablet  Commonly known as:  ROBINUL  Take 2 mg by mouth 2 (two) times daily as needed (stomach).     isosorbide mononitrate 30 MG 24 hr tablet  Commonly known as:  IMDUR  Take 15 mg by mouth daily.     levofloxacin 750 MG tablet  Commonly known as:  LEVAQUIN  Take 1 tablet (750 mg total) by mouth daily. X 4days     lisinopril 2.5 MG tablet  Commonly known as:  PRINIVIL,ZESTRIL  Take 1 tablet (2.5 mg total) by mouth at bedtime.  Start taking on:  06/16/2015     magnesium oxide 400 MG tablet  Commonly known as:  MAG-OX  Take 400 mg by mouth daily.     mometasone 220 MCG/INH inhaler  Commonly known as:  ASMANEX  Inhale 1 puff into the lungs daily.     mometasone 50 MCG/ACT nasal spray  Commonly known as:  NASONEX  Place 2 sprays into the nose daily as needed (allergies).     montelukast 10 MG tablet  Commonly known as:  SINGULAIR  Take 1 tablet (10 mg total) by mouth at bedtime. PLEASE SCHEDULE  APPOINTMENT.     nitroGLYCERIN 0.4 MG SL tablet  Commonly known as:  NITROSTAT  Place 1 tablet (0.4 mg total) under the tongue every 5 (five) minutes as needed for chest pain.     omeprazole 20 MG capsule  Commonly known as:  PRILOSEC  Take 1 capsule (20 mg total) by mouth 2 (two) times daily before a meal.     silodosin 4 MG Caps capsule  Commonly known as:  RAPAFLO  Take 8 mg by mouth See admin instructions. Every 2 days     simvastatin 40 MG tablet  Commonly known as:  ZOCOR  TAKE ONE TABLET BY MOUTH AT BEDTIME     spironolactone 25 MG tablet  Commonly known as:  ALDACTONE  Take 0.5 tablets (12.5 mg total) by mouth daily.  Start taking on:  06/16/2015     Vitamin D 2000 units Caps  Take 1 capsule by mouth daily.     warfarin 5 MG tablet  Commonly known as:  COUMADIN  Take 1 tablet by mouth daily or as  directed by coumadin clinic        VVS, Skin clean, dry and intact without evidence of skin break down, no evidence of skin tears noted.  Study drug given back to patient An After Visit Summary was printed and given to the patient.  Patient escorted via [place], and D/C home via private auto.  Audria Nine F  06/14/2015 3:26 PM

## 2015-06-14 NOTE — Progress Notes (Signed)
[place] for warfarin Indication: atrial fibrillation  Allergies  Allergen Reactions  . Amoxicillin Rash  . Penicillins Rash   Labs:  Recent Labs  06/12/15 0837 06/13/15 0602 06/14/15 0703  HGB 13.6 13.2  --   HCT 40.4 40.9  --   PLT 202 242  --   LABPROT 28.0* 31.8* 28.4*  INR 2.66* 3.15* 2.72*  CREATININE 0.89 1.03 0.99    Estimated Creatinine Clearance: 60.3 mL/min (by C-G formula based on Cr of 0.99).    Assessment: 61 yom on coumadin for afib, INR 3.15 > 2.72 after holding coumadin yesterday, CBC stable, Noted FOBT positive, likely from hemorrhoids. No further rectal bleeding reported. Discharge was deferred d/t hypotension and Vtach. Noted on D#2 levaquin  PTA dose: Take 2.5mg  on Monday, Wednesday and Friday. All other days take 5mg   Goal of Therapy:  INR 2-3 Monitor platelets by anticoagulation protocol: Yes   Plan:  - Coumadin 2.5 mg po x 1 today - Daily INR   Maryanna Shape, PharmD, BCPS  Clinical Pharmacist  Pager: [phone]   06/14/2015,8:43 AM

## 2015-06-14 NOTE — Progress Notes (Signed)
Subjective: No complaints  Objective: Vital signs in last 24 hours: Temp:  [97.7 F (36.5 C)-97.9 F (36.6 C)] 97.9 F (36.6 C) (05/16 0600) Pulse Rate:  [83-135] 101 (05/16 0600) Resp:  [16-20] 18 (05/16 0600) BP: (86-99)/(54-69) 96/69 mmHg (05/16 0940) SpO2:  [95 %-98 %] 97 % (05/16 0600) Weight:  [150 lb 2.1 oz (68.1 kg)] 150 lb 2.1 oz (68.1 kg) (05/16 0600) Last BM Date: 06/13/15  Intake/Output from previous day: 05/15 0701 - 05/16 0700 In: 780 [P.O.:780] Out: 500 [Urine:500] Intake/Output this shift:    Medications Scheduled Meds: . aspirin  81 mg Oral Daily  . BUC-CLIN 303 (study drug)- home med  1 tablet Oral BID  . budesonide (PULMICORT) nebulizer solution  0.25 mg Nebulization BID  . furosemide  80 mg Oral Daily  . isosorbide mononitrate  15 mg Oral Daily  . levofloxacin  750 mg Oral Q24H  . lisinopril  2.5 mg Oral QHS  . magnesium oxide  400 mg Oral Daily  . metoprolol tartrate  25 mg Oral BID  . montelukast  10 mg Oral QHS  . pantoprazole  40 mg Oral Daily  . simvastatin  40 mg Oral QHS  . sodium chloride flush  3 mL Intravenous Q12H  . spironolactone  12.5 mg Oral Daily  . tamsulosin  0.4 mg Oral QPC supper  . warfarin  2.5 mg Oral ONCE-1800  . Warfarin - Pharmacist Dosing Inpatient   Does not apply q1800   Continuous Infusions:  PRN Meds:.acetaminophen **OR** acetaminophen, morphine injection, prochlorperazine  PE: Well nourished, well developed, in no acute distress HEENT: Pupils are equal round react to light accommodation extraocular movements are intact.  Neck: no JVDNo cervical lymphadenopathy. Cardiac: Irregular rate and rhythm without murmurs rubs or gallops. Lungs:  clear to auscultation bilaterally, no wheezing, rhonchi or rales Ext: no lower extremity edema.  2+ radial and dorsalis pedis pulses. Skin: warm and dry Neuro:  Grossly normal    Lab Results:   Recent Labs  06/12/15 0837 06/13/15 0602  WBC 13.8* 11.4*  HGB 13.6  13.2  HCT 40.4 40.9  PLT 202 242   BMET  Recent Labs  06/12/15 0837 06/13/15 0602 06/14/15 0703  NA 132* 134* 135  K 3.8 4.3 3.5  CL 100* 97* 97*  CO2 23 28 24   GLUCOSE 100* 91 89  BUN 19 21* 19  CREATININE 0.89 1.03 0.99  CALCIUM 9.2 9.3 9.2   PT/INR  Recent Labs  06/12/15 0837 06/13/15 0602 06/14/15 0703  LABPROT 28.0* 31.8* 28.4*  INR 2.66* 3.15* 2.72*   Cholesterol No results for input(s): CHOL in the last 72 hours. Cardiac Enzymes Invalid input(s): TROPONIN,  CKMB  Studies/Results: @RISRSLT2 @   Assessment/Plan    Principal Problem:   RUQ abdominal pain Active Problems:   Ischemic cardiomyopathy   History of ventricular tachycardia   Asthma   GERD   ICD (implantable cardioverter-defibrillator) in place   CABG '98. Low risk Myoview 11/13   Paroxysmal atrial fibrillation (HCC)   Abdominal pain, chronic, epigastric   Hemorrhoids, internal   Hyperlipidemia LDL goal 123456   Chronic systolic congestive heart failure, NYHA class 1 (EF 20-25%)   Productive cough   RUQ pain   Acute on chronic systolic CHF (congestive heart failure) (HCC)   Acute URI   Pseudogout of knee   NSVT (nonsustained ventricular tachycardia) (HCC)   Hypokalemia   Acute on chronic combined systolic and diastolic CHF (congestive heart failure) ([place])  .  Acute on chronic combined systolic and diastolic heart failure:  Net fluids: Not accurate. Appears euvolemic. Weight going up ?(He's been weighed in the bed.. Dr. Sallyanne Kuster suspects dry weight is closer to 139-140 lb. On PO furosemide. I recommend he go home on 40mg  PO daily and 40mg  PRN daily for fluid gain of 3# in 24 hrs of 5# in a week. Also on aldactone 12.5, ACE, Imdur 15  2. CAD s/p extensive anterior MI: no angina 3. Right knee effusion:  pseudogout versus hemarthrosis. Denies recent trauma. History is fairly classic for pseudogout, but he is anticoagulated. Seems to be improving on steroids. 4. PAFib:  Patient went  into Afib around 0200hrs yesterday. Asymptomatic. He reports he goes in and out less frequent since being in the study. In bucindolol GENETIC AF trial. Keep appt on Wednesday in Afib clinic. CHADSVasc at least 4 (age 29, CAD, CHF). Coumadin. Ok to DC home.   Still in Afib.  Overall rate controlled.  He received one metoprolol dose of 25mg  yesterday at 1329 hrs an none since.  I think it wold be ok to DC.   5. NSVT: less prevalent last 24h; keep K >4  6. ICD: [employer] Virtuoso. Optivol Confirms clinical impression of volume overload, with gradual creep of fluid since one month ago. Improved  . He has been discussing CRT upgrade with Dr. 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 - Per Dr. Sallyanne Kuster over the weekend, the numbers seem to be improving which is indicative of appropriate therapy. Reaching euvolemia.  7.  Hypotension DC ACE   Meds:  DC lisinopril and metoprolol 25mg  since HR is controlled and he is hypotensive.  Change lasix to 40mg  daily and additional 40mg  PRN for 3# weight gain in 24hours or 5# in a week.   I will see him in the office on the 30th and if BP will tolerate it, restart ACE.    Patient said AFib research is supposed to see him today.    Dr. Ellyn Hack to see.   LOS: 5 days    Tarri Fuller PA-C 06/14/2015 11:54 AM  I have seen, examined and evaluated the patient this afternoon along with Mr. Samara Snide, [place].Marland Kitchen  After reviewing all the available data and chart, we discussed the patient's findings examine recommendations. I agree with his note with recommendations as indicated above.    Overall he is stable from a heart failure standpoint seems to be relatively euvolemic if anything maybe a little bit dry.   at this point I think probably using a standing dose of 40 mg Lasix with additional 40 mg when necessary is probably the best plan for diuretic. The best winter but it would be 40  mg tablets 1-2 tablets a day as directed.     As for his him hypotension today, my recommendation would be to hold ACE inhibitor today to allow for his blood pressure normalized. I would then restart lisinopril at 2.5 mg daily once seen back in clinic along with the spironolactone.   He is still in A. fib, but relatively well-controlled as far as rate goes. He is pretty asymptomatic from the A. Fib, but we would probably prefer to restore sinus rhythm. After a 45+ minute discussion with the patient, family and the [place] research group study team - weekend with plan for him to be discharged on his study medication and warfarin. He will come in to the research office  tomorrow and have an EKG checked. If he is in A. fib, we have already scheduled him for cardioversion by Dr. Curt Bears in the Cath Lab tomorrow. If he were to be in sinus rhythm, and this can be canceled.   I spent over an hour in direct consultation with the patient. Greater than 50% was spent in personal discussions as to potential course of action and plan. It also involved making telephone calls with other physicians and services.      Glenetta Hew, M.D., M.S. Interventional Cardiologist   Pager # [phone] Phone # [phone] [address] [place], [place] [address]

## 2015-06-15 ENCOUNTER — Ambulatory Visit (HOSPITAL_COMMUNITY): Admit: 2015-06-15 | Payer: Self-pay | Admitting: Cardiology

## 2015-06-15 ENCOUNTER — Encounter (HOSPITAL_COMMUNITY): Payer: Self-pay

## 2015-06-15 ENCOUNTER — Other Ambulatory Visit: Payer: Self-pay | Admitting: *Deleted

## 2015-06-15 SURGERY — CARDIOVERSION (CATH LAB)
Anesthesia: LOCAL

## 2015-06-15 NOTE — Patient Outreach (Addendum)
[facility]) Care Management  06/15/2015  [name] July 01, 1940 [id number]  RN spoke with pt's primary caregiver and pt today concerning his recent discharge from the hospital. RN reintroduced the [facility] program and purpose for today's call. RN has had involvement with this pt in the past for [facility] services.  RN completed the transition of care template and inquired further on pt's ongoing medical issues. Caregiver spouse reports pt had several issues going on while in the hospital with some improvement but no back to what he was prior to this illness. States [facility] will be involved and has contact pt but no appointment has been arranged at this time. RN began discussed on pt's main issues with SOB and fluid retention related to HF and educated pt on the HF zones related to fluid retention. Stress the importance of daily weights and what to do if acute symptoms should occur. Caregiver spouse has indicated its difficult to recognize fluid on the pt due to pt's weight never changes. RN educated on other area fluid may accumulate and encouraged pt to check his extremities (foot/legs, hands and trunk) area for swelling. Also discussed symptoms with increased SOB/coughing that may be contributed with HF symptoms also. Offered community home visits however hesitate at this time and requested to wait on [facility]'s scheduling initiated. RN offered to continue ongoing transition of care over the next few weeks and will continue to offer home visits accordingly. Along with any community resources to assist with pt's ongoing recovery and caregiver respite if the is needed during the pt's time of recovery. Stress the importance of a visit with a health care professional over the next few days and offered to visit if [facility] is not available at that time. Discussed the risk involved if pt opts not to have case management visits with ongoing teachings and education via community.  Again caregiver spouse  wishes for a telephonic follow up next week. RN offered to call on Monday with a follow up and will present once again an offer to completed a home visit.  [name], RN Care Management Coordinator [place] Office [phone]

## 2015-06-15 NOTE — Consult Note (Addendum)
   [facility] CM Inpatient Consult   06/15/2015  NIKOS ANGLEMYER 1940/04/24 [id number]   Late entry: 06/14/15  1500 Patient was assessed for [facility] Management for community services. Patient was previously active with [facility] Management.  Met with patient and his wife at bedside regarding being restarted with [facility] services. Patient agreed to services and will receive follow up call and be assessed for home visits.   Of note, [facility] Care Management services does not replace or interfere with any services that are arranged by inpatient case management or social work. For additional questions or referrals please contact:  Natividad Brood, RN BSN [facility] Liaison  [phone] business mobile phone Toll free office [phone]

## 2015-06-16 ENCOUNTER — Telehealth: Payer: Self-pay | Admitting: Internal Medicine

## 2015-06-16 ENCOUNTER — Telehealth: Payer: Self-pay

## 2015-06-16 ENCOUNTER — Ambulatory Visit (INDEPENDENT_AMBULATORY_CARE_PROVIDER_SITE_OTHER): Payer: Medicare Other

## 2015-06-16 ENCOUNTER — Ambulatory Visit: Payer: Medicare Other | Admitting: Gastroenterology

## 2015-06-16 DIAGNOSIS — Z95810 Presence of automatic (implantable) cardiac defibrillator: Secondary | ICD-10-CM | POA: Diagnosis not present

## 2015-06-16 DIAGNOSIS — E785 Hyperlipidemia, unspecified: Secondary | ICD-10-CM | POA: Diagnosis not present

## 2015-06-16 DIAGNOSIS — Z7901 Long term (current) use of anticoagulants: Secondary | ICD-10-CM | POA: Diagnosis not present

## 2015-06-16 DIAGNOSIS — J45909 Unspecified asthma, uncomplicated: Secondary | ICD-10-CM | POA: Diagnosis not present

## 2015-06-16 DIAGNOSIS — Z5181 Encounter for therapeutic drug level monitoring: Secondary | ICD-10-CM | POA: Diagnosis not present

## 2015-06-16 DIAGNOSIS — I2510 Atherosclerotic heart disease of native coronary artery without angina pectoris: Secondary | ICD-10-CM | POA: Diagnosis not present

## 2015-06-16 DIAGNOSIS — I480 Paroxysmal atrial fibrillation: Secondary | ICD-10-CM | POA: Diagnosis not present

## 2015-06-16 DIAGNOSIS — J189 Pneumonia, unspecified organism: Secondary | ICD-10-CM | POA: Diagnosis not present

## 2015-06-16 DIAGNOSIS — I252 Old myocardial infarction: Secondary | ICD-10-CM | POA: Diagnosis not present

## 2015-06-16 DIAGNOSIS — K589 Irritable bowel syndrome without diarrhea: Secondary | ICD-10-CM | POA: Diagnosis not present

## 2015-06-16 DIAGNOSIS — K219 Gastro-esophageal reflux disease without esophagitis: Secondary | ICD-10-CM | POA: Diagnosis not present

## 2015-06-16 DIAGNOSIS — Z950 Presence of cardiac pacemaker: Secondary | ICD-10-CM | POA: Diagnosis not present

## 2015-06-16 DIAGNOSIS — I5023 Acute on chronic systolic (congestive) heart failure: Secondary | ICD-10-CM | POA: Diagnosis not present

## 2015-06-16 DIAGNOSIS — I5022 Chronic systolic (congestive) heart failure: Secondary | ICD-10-CM

## 2015-06-16 DIAGNOSIS — I255 Ischemic cardiomyopathy: Secondary | ICD-10-CM | POA: Diagnosis not present

## 2015-06-16 NOTE — Telephone Encounter (Signed)
Remote ICM transmission received.  Attempted patient call and left message for return call.   

## 2015-06-16 NOTE — Telephone Encounter (Signed)
New message      [facility] nurse is visiting the pt today wants to make she can collect the pt's INR, cause the pt has another appt tomorrow and the pt just got out the hospital. Please call her back on number provided.

## 2015-06-16 NOTE — Telephone Encounter (Signed)
Patient with HH, will have INR drawn by them tomorrow.  Pt on levaquin 750 mg qd x 4, discharged on 5/16

## 2015-06-16 NOTE — Progress Notes (Signed)
EPIC Encounter for ICM Monitoring  Patient Name: [name] is a 75 y.o. male Date: 06/16/2015 Primary Care Physican: [name], MD Primary Cardiologist: [name] Electrophysiologist: [name] Dry Weight: 143 lbs        In the past month, have you:  1. Gained more than 2 pounds in a day or more than 5 pounds in a week? no  2. Had changes in your medications (with verification of current medications)? no  3. Had more shortness of breath than is usual for you? no  4. Limited your activity because of shortness of breath? no  5. Not been able to sleep because of shortness of breath? no  6. Had increased swelling in your feet, ankles, legs or stomach area? no  7. Had symptoms of dehydration (dizziness, dry mouth, increased thirst, decreased urine output) no  8. Had changes in sodium restriction? no  9. Been compliant with medication? Yes  ICM trend: 3 month view for 06/16/2015  ICM trend: 1 year view for 06/16/2015   Follow-up plan: ICM clinic phone appointment 07/04/2015.  Office appointment with Dr [name] on 07/19/2015.  He has post hospital visit with Tarri Fuller, PA on 06/28/2015.    Patient currently remains in AFIB drug study but would like to discuss with the physician regarding if he should continue.  Advised he may speak with Dr [name] on 07/19/2015 regarding the continuation of the study.  FLUID LEVELS:  Optivol thoracic impedance returned to baseline 06/10/2015.  Patient hospitalized from 06/09/2015 to 06/14/2015 for abdominal pain and SOB.    SYMPTOMS:   Since discharge he stated he is doing well.   He denied any fluid symptoms.  Denied any symptoms such as weight gain of 3 pounds overnight or 5 pounds within a week, SOB and/or lower extremity swelling.  Encouraged to call for any fluid symptoms.     Advised will send to PCP, Dr [name] and Dr [name] for updated ICM transmission since hospital discharge.   [name], RN, CCM 06/16/2015 11:06 AM

## 2015-06-17 ENCOUNTER — Encounter: Payer: Self-pay | Admitting: Gastroenterology

## 2015-06-17 ENCOUNTER — Ambulatory Visit (INDEPENDENT_AMBULATORY_CARE_PROVIDER_SITE_OTHER): Payer: Medicare Other | Admitting: Pharmacist

## 2015-06-17 ENCOUNTER — Ambulatory Visit (INDEPENDENT_AMBULATORY_CARE_PROVIDER_SITE_OTHER): Payer: Medicare Other | Admitting: Gastroenterology

## 2015-06-17 ENCOUNTER — Encounter: Payer: Medicare Other | Admitting: Pharmacist Clinician (PhC)/ Clinical Pharmacy Specialist

## 2015-06-17 VITALS — BP 88/60 | HR 76 | Ht 65.5 in | Wt 149.4 lb

## 2015-06-17 DIAGNOSIS — I2510 Atherosclerotic heart disease of native coronary artery without angina pectoris: Secondary | ICD-10-CM | POA: Diagnosis not present

## 2015-06-17 DIAGNOSIS — K589 Irritable bowel syndrome without diarrhea: Secondary | ICD-10-CM | POA: Diagnosis not present

## 2015-06-17 DIAGNOSIS — I255 Ischemic cardiomyopathy: Secondary | ICD-10-CM

## 2015-06-17 DIAGNOSIS — K219 Gastro-esophageal reflux disease without esophagitis: Secondary | ICD-10-CM

## 2015-06-17 DIAGNOSIS — I480 Paroxysmal atrial fibrillation: Secondary | ICD-10-CM

## 2015-06-17 DIAGNOSIS — R198 Other specified symptoms and signs involving the digestive system and abdomen: Secondary | ICD-10-CM | POA: Insufficient documentation

## 2015-06-17 DIAGNOSIS — I5023 Acute on chronic systolic (congestive) heart failure: Secondary | ICD-10-CM | POA: Diagnosis not present

## 2015-06-17 DIAGNOSIS — J189 Pneumonia, unspecified organism: Secondary | ICD-10-CM | POA: Diagnosis not present

## 2015-06-17 DIAGNOSIS — R140 Abdominal distension (gaseous): Secondary | ICD-10-CM

## 2015-06-17 DIAGNOSIS — Z7901 Long term (current) use of anticoagulants: Secondary | ICD-10-CM

## 2015-06-17 LAB — POCT INR: INR: 1.9

## 2015-06-17 MED ORDER — SACCHAROMYCES BOULARDII 250 MG PO CAPS: 250.0000 mg | ORAL_CAPSULE | Freq: Two times a day (BID) | ORAL | Status: DC

## 2015-06-17 NOTE — Progress Notes (Signed)
     06/17/2015 [name] [id number] [birth year]   History of Present Illness:  This is a 75 year old male known to [name] for treatment of his GERD and irritable bowel.  He was recently hospitalized for couple of days and just discharged 3 days ago for complaints of right upper quadrant abdominal pain and acute on chronic CHF.  He underwent some gallbladder evaluation was determined that maybe he had some chronic acalculous cholecystitis, but they opted to treat it with antibiotics since surgery is high risk. Thoughts were that this was either causing his right upper quadrant abdominal pain or possibly pressure from his acute heart failure.  Pain improved and overall has resolved (? If antibiotics helped or if it just got better with time).  He is here today for follow-up.  Pain is still better.  Complaining of bloating related to IBS.    Current Medications, Allergies, Past Medical History, Past Surgical History, Family History and Social History were reviewed in [employer] record.   Physical Exam: BP 88/60 mmHg  Pulse 76  Ht 5' 5.5" (1.664 m)  Wt 149 lb 6 oz (67.756 kg)  BMI 24.47 kg/m2 General: Well developed male in no acute distress Head: Normocephalic and atraumatic Eyes:  Sclerae anicteric, conjunctiva pink  Ears: Normal auditory acuity Lungs: Clear throughout to auscultation Heart: Regular rate and rhythm Abdomen: Soft, non-distended.  Normal bowel sounds.  Non-tender. Musculoskeletal: Symmetrical with no gross deformities  Extremities: No edema  Neurological: Alert oriented x 4, grossly non-focal Psychological:  Alert and cooperative. Normal mood and affect  Assessment and Recommendations: -RUQ abdominal pain:  Recently in hospital for this.  Now resolved.  ? If this was pressure related to acute on chronic heart failure versus early acute cholecystitis improved with antibiotics, etc. -IBS with bloating:  Suggested trying Florastor  probiotic daily and also IBgard prn.  Will also continue use of anti-spasmodics prn.

## 2015-06-17 NOTE — Patient Instructions (Signed)
We have given you a printed prescription for the Florastor probioitc.  We have given you samples of IBGard and a coupon.   Follow up as needed . We will pu in a recall in our system for the Cologuard test .

## 2015-06-20 ENCOUNTER — Encounter: Payer: Self-pay | Admitting: *Deleted

## 2015-06-20 ENCOUNTER — Other Ambulatory Visit: Payer: Self-pay | Admitting: *Deleted

## 2015-06-20 NOTE — Patient Outreach (Addendum)
[facility]) Care Management  06/20/2015  SHUBH REYNAUD Jun 25, 1940 [id number]  Transition of care  RN spoke with pt today and inquired on his ongoing management of care concerning his HF. Pt reports weights today, yesterday and last week remains at 143 lbs with no reported signs or symptoms of HF. Pt denies any swelling, breathing or any related symptoms at this time. Pt indicates he is getting ready to leave for the beach for a few days however will return by next week. Pt reports he remains weak but continues to do well with no acute issues. RN reiterated on the plan of care and goals with pt's understanding. RN also verified pt remains in the GREEN zone. RN requested ongoing transition of care follow ups as pt remains receptive with a scheduled telephone call for next week.  [name], RN Care Management Coordinator [place] Office [phone]

## 2015-06-20 NOTE — Progress Notes (Signed)
Agree with assessment and plan as outlined. Unclear etiology for pain. Workup reviewed during recent inpatient stay. LFTs were normal. HIDA normal. Symptoms have intervally resolved. He should contact us for reassessment should he have any recurrence of this pain.

## 2015-06-24 ENCOUNTER — Ambulatory Visit (INDEPENDENT_AMBULATORY_CARE_PROVIDER_SITE_OTHER): Payer: Medicare Other | Admitting: Pharmacist Clinician (PhC)/ Clinical Pharmacy Specialist

## 2015-06-24 DIAGNOSIS — M1120 Other chondrocalcinosis, unspecified site: Secondary | ICD-10-CM | POA: Diagnosis not present

## 2015-06-24 DIAGNOSIS — I480 Paroxysmal atrial fibrillation: Secondary | ICD-10-CM

## 2015-06-24 DIAGNOSIS — I5023 Acute on chronic systolic (congestive) heart failure: Secondary | ICD-10-CM | POA: Diagnosis not present

## 2015-06-24 DIAGNOSIS — F419 Anxiety disorder, unspecified: Secondary | ICD-10-CM | POA: Diagnosis not present

## 2015-06-24 DIAGNOSIS — I2510 Atherosclerotic heart disease of native coronary artery without angina pectoris: Secondary | ICD-10-CM | POA: Diagnosis not present

## 2015-06-24 DIAGNOSIS — Z7901 Long term (current) use of anticoagulants: Secondary | ICD-10-CM

## 2015-06-24 DIAGNOSIS — J189 Pneumonia, unspecified organism: Secondary | ICD-10-CM | POA: Diagnosis not present

## 2015-06-24 DIAGNOSIS — I255 Ischemic cardiomyopathy: Secondary | ICD-10-CM | POA: Diagnosis not present

## 2015-06-24 DIAGNOSIS — K589 Irritable bowel syndrome without diarrhea: Secondary | ICD-10-CM | POA: Diagnosis not present

## 2015-06-24 DIAGNOSIS — K819 Cholecystitis, unspecified: Secondary | ICD-10-CM | POA: Diagnosis not present

## 2015-06-24 DIAGNOSIS — E782 Mixed hyperlipidemia: Secondary | ICD-10-CM | POA: Diagnosis not present

## 2015-06-24 DIAGNOSIS — J449 Chronic obstructive pulmonary disease, unspecified: Secondary | ICD-10-CM | POA: Diagnosis not present

## 2015-06-24 LAB — POCT INR: INR: 2.6

## 2015-06-27 DIAGNOSIS — I480 Paroxysmal atrial fibrillation: Secondary | ICD-10-CM | POA: Diagnosis not present

## 2015-06-27 DIAGNOSIS — I5023 Acute on chronic systolic (congestive) heart failure: Secondary | ICD-10-CM | POA: Diagnosis not present

## 2015-06-27 DIAGNOSIS — I2510 Atherosclerotic heart disease of native coronary artery without angina pectoris: Secondary | ICD-10-CM | POA: Diagnosis not present

## 2015-06-27 DIAGNOSIS — I255 Ischemic cardiomyopathy: Secondary | ICD-10-CM | POA: Diagnosis not present

## 2015-06-27 DIAGNOSIS — K589 Irritable bowel syndrome without diarrhea: Secondary | ICD-10-CM | POA: Diagnosis not present

## 2015-06-27 DIAGNOSIS — J189 Pneumonia, unspecified organism: Secondary | ICD-10-CM | POA: Diagnosis not present

## 2015-06-28 ENCOUNTER — Ambulatory Visit (INDEPENDENT_AMBULATORY_CARE_PROVIDER_SITE_OTHER): Payer: Medicare Other | Admitting: Physician Assistant

## 2015-06-28 ENCOUNTER — Ambulatory Visit: Payer: Medicare Other | Admitting: Physician Assistant

## 2015-06-28 ENCOUNTER — Encounter: Payer: Self-pay | Admitting: Physician Assistant

## 2015-06-28 ENCOUNTER — Other Ambulatory Visit: Payer: Self-pay | Admitting: *Deleted

## 2015-06-28 VITALS — BP 90/64 | HR 80 | Ht 67.0 in | Wt 148.5 lb

## 2015-06-28 DIAGNOSIS — I2583 Coronary atherosclerosis due to lipid rich plaque: Secondary | ICD-10-CM

## 2015-06-28 DIAGNOSIS — M25461 Effusion, right knee: Secondary | ICD-10-CM | POA: Diagnosis not present

## 2015-06-28 DIAGNOSIS — I480 Paroxysmal atrial fibrillation: Secondary | ICD-10-CM

## 2015-06-28 DIAGNOSIS — M25561 Pain in right knee: Secondary | ICD-10-CM | POA: Diagnosis not present

## 2015-06-28 DIAGNOSIS — I952 Hypotension due to drugs: Secondary | ICD-10-CM

## 2015-06-28 DIAGNOSIS — I2510 Atherosclerotic heart disease of native coronary artery without angina pectoris: Secondary | ICD-10-CM

## 2015-06-28 DIAGNOSIS — I255 Ischemic cardiomyopathy: Secondary | ICD-10-CM

## 2015-06-28 DIAGNOSIS — Z95810 Presence of automatic (implantable) cardiac defibrillator: Secondary | ICD-10-CM | POA: Diagnosis not present

## 2015-06-28 DIAGNOSIS — Z7901 Long term (current) use of anticoagulants: Secondary | ICD-10-CM

## 2015-06-28 DIAGNOSIS — I959 Hypotension, unspecified: Secondary | ICD-10-CM | POA: Insufficient documentation

## 2015-06-28 DIAGNOSIS — I5042 Chronic combined systolic (congestive) and diastolic (congestive) heart failure: Secondary | ICD-10-CM

## 2015-06-28 NOTE — Patient Instructions (Signed)
Stop Lisinopril  Your physician recommends that you schedule a follow-up appointment in: 3 months with Dr.Kelly.

## 2015-06-28 NOTE — Patient Outreach (Signed)
[facility]) Care Management  06/28/2015  [name] 04/23/1940 [id number]   Transition of care  RN attempted outreach call today however pt not available. RN able to leave a HIPAA approved voice message requesting a call back. Will follow up accordingly with ongoing transition of care calls.   [name], RN Care Management Coordinator [place] Office [phone]

## 2015-06-28 NOTE — Progress Notes (Signed)
Patient ID: [name], male   DOB: [birth year], 75 y.o.   MRN: [id number]    Date:  06/28/2015   ID:  [name] 12/19/1940, MRN [id number]  PCP:  Jason Frees, MD  Primary Cardiologist:   [name]  Chief Complaint  Patient presents with  . Follow-up    no chest pain, occassional shortness of breath, no edema, no pain or cramping in legs, no lightheaded or dizziness     History of Present Illness: [name] is a 75 y.o. 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 In September 1998 he underwent CABG revascularization surgery after an unsuccessful attempt at stenting of his proximal LAD by [name]. Ejection fraction was 25-30%. In 2002 he underwent initial ICD implantation for nonsustained ventricular tachycardia documented on event monitor for primary prevention. In January 2010 he underwent generator change with a Medtronic Virtuoso single chamber cardioverter defibrillator. Additional problems include paroxysmal atrial fibrillation, occasional PVCs and an attempt was made in the past to overdrive suppress his PVCs with reducing his beta blocker therapy. He felt he had PVCs on the reduced dose and therefore has been on the higher dose.   A nuclear perfusion study in November 2013 showed a large area of scar in the LAD territory (extent 44%) involving the mid to apical anterior, apical and infero-apical to mid infero-septal and apical lateral wall without associated ischemia.  An echo Doppler study on 01/01/2013 revealed a mildly dilated LV. Ejection fraction was 25-30%. There was dyskinesis in scarring of the apical myocardium as well as akinesis of the mid anteroseptal, anterior and anterolateral walls consistent with his prior LAD infarction. He does have a history of paroxysmal atrial fibrillation.   He has a history of mild asthma, hyperlipidemia and is tolerating Zocor 40 mg, gout,  which he has been on allopurinol at low dose 150 mg daily without recurrence. He is on Coumadin anticoagulation for PAF. He was readmitted to the hospital from a May 2 through 06/04/2014 with recurrent atrial fibrillation and started on Tikosyn. He has been enrolled in the genetic AF trial and is now off Tikosyn and on the study drug of either Bucindolol or metoprolol high dose. He was doing well at his last OV.   He presented to the ER earlier this month with complaints of SOB.  He was found to have distended GB on [name] with diffuse GB wall thickening without cholelithiasis with acalculous cholecystitis and right pleural effusion. Cardiology consulted for further treatment of elevated BNP and possible preoperative cardiac clearance for cholecystectomy. Fluids and weight were not accurate during hospitalization. It was recommended he go on home on 40 mg by mouth Lasix daily with 40 mg when necessary for additional weight gain as well as Aldactone 12.5. He was periodically going in and out of atrial fibrillation while he was hospitalized.  He has been discussing CRT upgrade with [name] and has intermediate predictors of benefit (LBBB with QRS almost 150 ms, but ischemic cardiomyopathy and male gender).  His ACE inhibitor was decreased to 2.5 mg daily at discharge.  Patient resents for posthospital follow-up. Reports some mild shortness of breath which [name] and goes. He was down at [facility] last week for vacation and did quite well. He took an extra half a tablet of Lasix as preventative for edema since he was in the eating out a lot. His weight is currently stable and in  monitors it regularly. He said his atrial fibrillation stopped the same night that he was discharged. He does regularly feel fatigued or tired.  The patient currently denies nausea, vomiting, fever, chest pain, orthopnea, dizziness, PND, cough, congestion, abdominal pain, hematochezia, melena, lower extremity edema, claudication.  Wt  Readings from Last 3 Encounters:  06/28/15 148 lb 8 oz (67.359 kg)  06/17/15 149 lb 6 oz (67.756 kg)  06/14/15 150 lb 2.1 oz (68.1 kg)     Past Medical History  Diagnosis Date  . Coronary artery disease     Hx MI 1992, CABG 1998 , Nuc study 11.2013 large scar but no ischemia  . 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 Paroxysmal atrial fibrillation (HCC)   . GERD (gastroesophageal reflux disease)   . H. pylori infection     Hx of   . Cardiomyopathy, ischemic 2011    with EF 25-35% by echo  . H/O myocardial infarction, greater than 8 weeks 1992    large ant wall injury  . NSVT (nonsustained ventricular tachycardia) ([place])   . Acute on chronic systolic CHF (congestive heart failure) ([place]) 06/09/2015    Current Outpatient Prescriptions  Medication Sig Dispense Refill  . acetaminophen (TYLENOL) 650 MG CR tablet Take 650 mg by mouth 2 (two) times daily.     . AMBULATORY NON FORMULARY MEDICATION Take 1 tablet by mouth 2 (two) times daily. Medication Name: GENETIC-AF study drug Metoprolol XL 200 mg QD vs Bucindolol 50 mg BID    . aspirin 81 MG chewable tablet Chew 1 tablet (81 mg total) by mouth on Monday, Wednesday, & Friday. 12 tablet 11  . cetirizine (ZYRTEC) 10 MG tablet Take 10 mg by mouth daily.      . Cholecalciferol (VITAMIN D) 2000 UNITS CAPS Take 1 capsule by mouth daily.    Marland Kitchen dicyclomine (BENTYL) 10 MG/5ML syrup Take 10 mg by mouth 2 (two) times daily as needed (IBS).     . furosemide (LASIX) 40 MG tablet Take 1 tablet (40 mg total) by mouth daily. Take 40 mg daily, additional 40 mg if weight gain of >3lbs in 24hrs or >5lbs in 1 week 60 tablet 3  . glycopyrrolate (ROBINUL) 2 MG tablet Take 2 mg by mouth 2 (two) times daily as needed (stomach).    . isosorbide mononitrate (IMDUR) 30 MG 24 hr tablet Take 15 mg by mouth daily.    . magnesium oxide (MAG-OX) 400 MG tablet Take 400 mg by mouth daily.    . mometasone (ASMANEX) 220 MCG/INH  inhaler Inhale 1 puff into the lungs daily.     . mometasone (NASONEX) 50 MCG/ACT nasal spray Place 2 sprays into the nose daily as needed (allergies).     . montelukast (SINGULAIR) 10 MG tablet Take 1 tablet (10 mg total) by mouth at bedtime. PLEASE SCHEDULE APPOINTMENT. 30 tablet 0  . nitroGLYCERIN (NITROSTAT) 0.4 MG SL tablet Place 1 tablet (0.4 mg total) under the tongue every 5 (five) minutes as needed for chest pain. 25 tablet 1  . omeprazole (PRILOSEC) 20 MG capsule Take 1 capsule (20 mg total) by mouth 2 (two) times daily before a meal. 180 capsule 3  . saccharomyces boulardii (FLORASTOR) 250 MG capsule Take 1 capsule (250 mg total) by mouth 2 (two) times daily. 60 capsule 5  . silodosin (RAPAFLO) 4 MG CAPS capsule Take 8 mg by mouth See admin instructions. Every 2 days    . simvastatin (ZOCOR)  40 MG tablet TAKE ONE TABLET BY MOUTH AT BEDTIME 90 tablet 3  . spironolactone (ALDACTONE) 25 MG tablet Take 0.5 tablets (12.5 mg total) by mouth daily. 30 tablet 11  . warfarin (COUMADIN) 5 MG tablet Take 1 tablet by mouth daily or as directed by coumadin clinic (Patient taking differently: Take 2.5-5 mg by mouth daily. Take 2.5mg  on Monday, Wednesday and Friday. All other days take 5mg ) 90 tablet 0   No current facility-administered medications for this visit.    Allergies:    Allergies  Allergen Reactions  . Amoxicillin Rash  . Penicillins Rash    Social History:  The patient  reports that he has never smoked. He has never used smokeless tobacco. He reports that he drinks about 1.2 oz of alcohol per week. He reports that he does not use illicit drugs.   Family history:   Family History  Problem Relation Age of Onset  . Colon cancer Neg Hx   . Heart disease Father     questionable  . Stroke Mother   . Heart failure Sister   . Healthy Brother   . Healthy Sister   . Parkinsonism Brother     ROS:  Please see the history of present illness.  All other systems reviewed and negative.    PHYSICAL EXAM: VS:  BP 90/64 mmHg  Pulse 80  Ht 5\' 7"  (1.702 m)  Wt 148 lb 8 oz (67.359 kg)  BMI 23.25 kg/m2 Well nourished, well developed, in no acute distress HEENT: Pupils are equal round react to light accommodation extraocular movements are intact.  Neck: no JVDNo cervical lymphadenopathy. Cardiac: Irregular rate and rhythm without murmurs rubs or gallops. Lungs:  clear to auscultation bilaterally, no wheezing, rhonchi or rales Abd: soft, nontender, positive bowel sounds all quadrants, no hepatosplenomegaly Ext: no lower extremity edema.  2+ radial and dorsalis pedis pulses. Skin: warm and dry Neuro:  Grossly normal    ASSESSMENT AND PLAN:  Problem List Items Addressed This Visit    Paroxysmal atrial fibrillation ([place])   Long term current use of anticoagulant therapy (Chronic)   Ischemic cardiomyopathy - Primary (Chronic)   ICD (implantable cardioverter-defibrillator) in place   Hypotension   Chronic combined systolic and diastolic heart failure, NYHA class 1 ([place])   CABG '98. Low risk Myoview 11/13 (Chronic)     Appears to be sinus rhythm on exam. Rate is well controlled at 80 bpm.  He is in bucindolol GENETIC AF trial. CHADSVasc at least 4 (age 59, CAD, CHF). Coumadin.  He appears euvolemic on exam. Continue 40 mg of Lasix daily with additional Lasix when necessary for weight gain as well as Aldactone 12.5 mg daily. He is hypotensive and states he feels tired irregularly. We'll discontinue the 2.5 mg of lisinopril and see if a little more blood pressure will help.  He denies chest pain is also on 881 mg of aspirin daily and 50 mg of Imdur daily.  Blood pressure does not improve would consider is continuing on indoor. Continue Zocor for cholesterol management.

## 2015-07-01 ENCOUNTER — Other Ambulatory Visit: Payer: Self-pay | Admitting: *Deleted

## 2015-07-01 ENCOUNTER — Ambulatory Visit (INDEPENDENT_AMBULATORY_CARE_PROVIDER_SITE_OTHER): Payer: Medicare Other | Admitting: Pharmacist

## 2015-07-01 DIAGNOSIS — K589 Irritable bowel syndrome without diarrhea: Secondary | ICD-10-CM | POA: Diagnosis not present

## 2015-07-01 DIAGNOSIS — I480 Paroxysmal atrial fibrillation: Secondary | ICD-10-CM | POA: Diagnosis not present

## 2015-07-01 DIAGNOSIS — I2510 Atherosclerotic heart disease of native coronary artery without angina pectoris: Secondary | ICD-10-CM | POA: Diagnosis not present

## 2015-07-01 DIAGNOSIS — I255 Ischemic cardiomyopathy: Secondary | ICD-10-CM | POA: Diagnosis not present

## 2015-07-01 DIAGNOSIS — J189 Pneumonia, unspecified organism: Secondary | ICD-10-CM | POA: Diagnosis not present

## 2015-07-01 DIAGNOSIS — I5023 Acute on chronic systolic (congestive) heart failure: Secondary | ICD-10-CM | POA: Diagnosis not present

## 2015-07-01 DIAGNOSIS — Z7901 Long term (current) use of anticoagulants: Secondary | ICD-10-CM

## 2015-07-01 LAB — POCT INR: INR: 2.8

## 2015-07-01 NOTE — Patient Outreach (Addendum)
[facility] Care Management  07/01/2015  [name] 12-06-1940 [id number]   Transition of care  Pt returned call to RN on 5/31 via voice message from attempted call made on 5/30.  RN returned call via second attempt on 6/1 and left another HIPAA approved voice mesages to pt's cell number.  Pt returned a call once again today however RN not available to take the call at the time and it is after hours.   Plan to call pt on Monday with ongoing follow up transition of care contact. Note telephone is already scheduled for this call.  [name], RN Care Management Coordinator [place] Office [phone]

## 2015-07-04 ENCOUNTER — Other Ambulatory Visit: Payer: Self-pay | Admitting: *Deleted

## 2015-07-04 ENCOUNTER — Ambulatory Visit (INDEPENDENT_AMBULATORY_CARE_PROVIDER_SITE_OTHER): Payer: Medicare Other

## 2015-07-04 DIAGNOSIS — I5022 Chronic systolic (congestive) heart failure: Secondary | ICD-10-CM | POA: Diagnosis not present

## 2015-07-04 DIAGNOSIS — Z95810 Presence of automatic (implantable) cardiac defibrillator: Secondary | ICD-10-CM

## 2015-07-04 NOTE — Progress Notes (Signed)
EPIC Encounter for ICM Monitoring  Patient Name: [name] is a 75 y.o. male Date: 07/04/2015 Primary Care Physican: [name], MD Primary Cardiologist: [name] Electrophysiologist: [name] Dry Weight: 141 lbs       In the past month, have you:  1. Gained more than 2 pounds in a day or more than 5 pounds in a week? no  2. Had changes in your medications (with verification of current medications)? no  3. Had more shortness of breath than is usual for you? no  4. Limited your activity because of shortness of breath? no  5. Not been able to sleep because of shortness of breath? no  6. Had increased swelling in your feet, ankles, legs or stomach area? no  7. Had symptoms of dehydration (dizziness, dry mouth, increased thirst, decreased urine output) no  8. Had changes in sodium restriction? no  9. Been compliant with medication? Yes  ICM trend: 3 month view for 07/04/2015   ICM trend: 1 year view for 07/04/2015  Follow-up plan: ICM clinic phone appointment 07/11/2015 recheck fluid levels.    FLUID LEVELS:  Optivol thoracic impedance decreased 06/15/2015 to 07/04/2015 suggesting fluid accumulation.    SYMPTOMS:  Denied any symptoms such as weight gain of 3 pounds overnight or 5 pounds within a week, SOB and/or lower extremity swelling.      RECOMMENDATIONS:  Recommended to increase Furosemide 40 mg to bid x 2 days and then return to normal prescribed dosage of Furosemide 40 mg daily.    Advised will send to PCP, [name] and Dr. [name] for review regarding decreased thoracic impedance.  If any further recommendations, will call back.    [name], RN, CCM 07/04/2015 10:40 AM

## 2015-07-04 NOTE — Patient Outreach (Signed)
[facility]) Care Management  07/04/2015  BATUHAN ROLLER 10-08-1940 [id number]   RN attempted outreach call to pt's today however pt was walking this morning and requested to call this RN back later in the day. RN will update pt's assessment and inquired further on pt's management of care concerning his HTN/HF.   [name], RN Care Management Coordinator [place] Office [phone]

## 2015-07-04 NOTE — Patient Outreach (Signed)
[name] Care Management  07/04/2015  [name] 06/17/1940 [id number]   Pt returned the call via transition of care and indicated he has been informed by his provider to take extra dosing of Lasix due to fluid found on recent test. Pt also reports other symptoms with coughing at night and difficulty sleeping. Pt states he is able to complete his ADL/IADLs and continues his daily walk. Pt denies any issues with his weight indicating it remains at 141 lbs and his BP is 102/60 with HR at 65. Pt without any other symptoms and continues to follow the recommendations of his provider with the extra dosage of Lasix. Pt states they will re-evaluate his on Monday based up this intervention of extra medications. No other issues reported at pt continues to recovery well. Pt inquired on his HR thinking it should be lower for a resting period of his heart. RN has encouraged pt to have this conversation with his provider due to no reported issues effecting his HF at this time.  RN reiterated on the HF zones and strongly encouraged pt to follow the recent instructions on his change in his medications and RN will follow up next week. RN continued to offer community home visits for one-on-one education if he felt this would be beneficial to him. Pt receptive to a follow up call next week to inquire further on his progress. Will schedule a follow up call accordingly for ongoing transition of care call.  [name], RN Care Management Coordinator [place] Office [phone]

## 2015-07-05 ENCOUNTER — Telehealth: Payer: Self-pay | Admitting: Internal Medicine

## 2015-07-05 ENCOUNTER — Ambulatory Visit (INDEPENDENT_AMBULATORY_CARE_PROVIDER_SITE_OTHER): Payer: Medicare Other | Admitting: Pharmacist

## 2015-07-05 DIAGNOSIS — I480 Paroxysmal atrial fibrillation: Secondary | ICD-10-CM

## 2015-07-05 DIAGNOSIS — I5022 Chronic systolic (congestive) heart failure: Secondary | ICD-10-CM

## 2015-07-05 DIAGNOSIS — Z7901 Long term (current) use of anticoagulants: Secondary | ICD-10-CM

## 2015-07-05 DIAGNOSIS — J189 Pneumonia, unspecified organism: Secondary | ICD-10-CM | POA: Diagnosis not present

## 2015-07-05 DIAGNOSIS — I2510 Atherosclerotic heart disease of native coronary artery without angina pectoris: Secondary | ICD-10-CM | POA: Diagnosis not present

## 2015-07-05 DIAGNOSIS — K589 Irritable bowel syndrome without diarrhea: Secondary | ICD-10-CM | POA: Diagnosis not present

## 2015-07-05 DIAGNOSIS — I5023 Acute on chronic systolic (congestive) heart failure: Secondary | ICD-10-CM | POA: Diagnosis not present

## 2015-07-05 DIAGNOSIS — I255 Ischemic cardiomyopathy: Secondary | ICD-10-CM | POA: Diagnosis not present

## 2015-07-05 LAB — POCT INR: INR: 3.3

## 2015-07-05 NOTE — Telephone Encounter (Signed)
New message      Pt was told to take extra lasix today and yesterday by the device clinic nurse.  Should pt also be taking potassium?

## 2015-07-05 NOTE — Telephone Encounter (Signed)
Return call to patient.  Advised spoke with Dr [name] regarding potassium.  He ordered to have blood work drawn tomorrow to check his potassium and he can take the extra Lasix today.  Patient stated he cannot come for labs tomorrow but will come on Thursday.  He stated he is feeling fine and denied any symptoms at this time.

## 2015-07-06 ENCOUNTER — Telehealth: Payer: Self-pay | Admitting: *Deleted

## 2015-07-06 NOTE — Telephone Encounter (Signed)
[name] called the Research Office today for advice. He is concerned he continues to be fatigued and has difficulty performing his usual activities. He states his blood pressure has been running 100-110/60's. His weight has been stable. He would like to decrease his beta-blocker(study drug) to see if that would help. He will be coming into the research office for an unscheduled visit tomorrow to discuss further.

## 2015-07-07 ENCOUNTER — Other Ambulatory Visit (INDEPENDENT_AMBULATORY_CARE_PROVIDER_SITE_OTHER): Payer: Medicare Other | Admitting: *Deleted

## 2015-07-07 DIAGNOSIS — I5022 Chronic systolic (congestive) heart failure: Secondary | ICD-10-CM | POA: Diagnosis not present

## 2015-07-07 LAB — BASIC METABOLIC PANEL
BUN: 13 mg/dL (ref 7–25)
CHLORIDE: 103 mmol/L (ref 98–110)
CO2: 25 mmol/L (ref 20–31)
CREATININE: 0.98 mg/dL (ref 0.70–1.18)
Calcium: 8.9 mg/dL (ref 8.6–10.3)
GLUCOSE: 68 mg/dL (ref 65–99)
POTASSIUM: 3.9 mmol/L (ref 3.5–5.3)
Sodium: 137 mmol/L (ref 135–146)

## 2015-07-07 NOTE — Addendum Note (Signed)
Addended by: Eulis Foster on: 07/07/2015 10:33 AM   Modules accepted: Orders

## 2015-07-07 NOTE — Telephone Encounter (Signed)
If he needs to sure

## 2015-07-08 NOTE — Telephone Encounter (Signed)
ok 

## 2015-07-11 ENCOUNTER — Telehealth: Payer: Self-pay | Admitting: Gastroenterology

## 2015-07-11 ENCOUNTER — Ambulatory Visit (INDEPENDENT_AMBULATORY_CARE_PROVIDER_SITE_OTHER): Payer: Medicare Other

## 2015-07-11 DIAGNOSIS — I255 Ischemic cardiomyopathy: Secondary | ICD-10-CM | POA: Diagnosis not present

## 2015-07-11 DIAGNOSIS — Z95810 Presence of automatic (implantable) cardiac defibrillator: Secondary | ICD-10-CM | POA: Diagnosis not present

## 2015-07-11 DIAGNOSIS — I5022 Chronic systolic (congestive) heart failure: Secondary | ICD-10-CM

## 2015-07-11 NOTE — Telephone Encounter (Signed)
Last night had reflux and did not sleep. States it has been happening for awhile now. Also is tender at the navel area. He is taking Omeprazole  BID but it is not helping. He understands to call cardiology if he feels it is heart related. Scheduled with Alonza Bogus, PA on 07/13/15 at 3:00 PM.

## 2015-07-11 NOTE — Progress Notes (Signed)
EPIC Encounter for ICM Monitoring  Patient Name: [name] is a 75 y.o. male Date: 07/11/2015 Primary Care Physican: [name], MD Primary Cardiologist: Kelly/Bensimhon Electrophysiologist: [name] Dry Weight: 141lbs      In the past month, have you:  1. Gained more than 2 pounds in a day or more than 5 pounds in a week? no  2. Had changes in your medications (with verification of current medications)? no  3. Had more shortness of breath than is usual for you? Continues to have night time SOB  4. Limited your activity because of shortness of breath? no  5. Not been able to sleep because of shortness of breath? no  6. Had increased swelling in your feet, ankles, legs or stomach area? no  7. Had symptoms of dehydration (dizziness, dry mouth, increased thirst, decreased urine output) no  8. Had changes in sodium restriction? no  9. Been compliant with medication? Yes  ICM trend: 3 month view for 07/11/2015   ICM trend: 1 year view for 07/11/2015   Follow-up plan: ICM clinic phone appointment 08/22/2015.   Office appointment as new patient with Dr [name] on 6/13/52017 and appointment with Dr [name] on 07/19/2015.     FLUID LEVELS:  Since last ICM transmission on 07/04/2015, Optivol thoracic impedance continues to be decreased from 06/16/2015 to 07/11/2015 suggesting fluid accumulation.  No change in thoracic impedance since taking 2 days of additional Furosemide 40 mg on 07/04/2015 and 07/05/2015.    SYMPTOMS:   Patient reported he gets SOB at night and did not notice if additional Furosemide improved his night time breathing.  Patient has had ongoing SOB at night and has difficulty determining if it has worsened.       EDUCATION:  Patient does not check how much sodium is in foods but thinks his intake is no more than 2000mg .       RECOMMENDATIONS: No changes today since he has upcoming appointments with Dr [name] and Dr [name].  Advised will send to PCP, [name], Dr.  [name] and Dr [name] for review.    [name], RN, CCM 07/11/2015 9:53 AM

## 2015-07-11 NOTE — Progress Notes (Signed)
Remote ICD transmission.   

## 2015-07-12 ENCOUNTER — Ambulatory Visit (HOSPITAL_COMMUNITY)
Admission: RE | Admit: 2015-07-12 | Discharge: 2015-07-12 | Disposition: A | Discharge status: Home / Self Care | Payer: Medicare Other | Source: Ambulatory Visit | Attending: Internal Medicine | Admitting: Internal Medicine

## 2015-07-12 ENCOUNTER — Encounter (HOSPITAL_COMMUNITY): Payer: Self-pay | Admitting: Internal Medicine

## 2015-07-12 VITALS — BP 95/61 | HR 77 | Resp 20 | Wt 143.8 lb

## 2015-07-12 DIAGNOSIS — Z8249 Family history of ischemic heart disease and other diseases of the circulatory system: Secondary | ICD-10-CM | POA: Diagnosis not present

## 2015-07-12 DIAGNOSIS — I2510 Atherosclerotic heart disease of native coronary artery without angina pectoris: Secondary | ICD-10-CM | POA: Insufficient documentation

## 2015-07-12 DIAGNOSIS — Z880 Allergy status to penicillin: Secondary | ICD-10-CM | POA: Insufficient documentation

## 2015-07-12 DIAGNOSIS — K219 Gastro-esophageal reflux disease without esophagitis: Secondary | ICD-10-CM | POA: Insufficient documentation

## 2015-07-12 DIAGNOSIS — Z95810 Presence of automatic (implantable) cardiac defibrillator: Secondary | ICD-10-CM | POA: Insufficient documentation

## 2015-07-12 DIAGNOSIS — I447 Left bundle-branch block, unspecified: Secondary | ICD-10-CM | POA: Diagnosis not present

## 2015-07-12 DIAGNOSIS — Z7982 Long term (current) use of aspirin: Secondary | ICD-10-CM | POA: Diagnosis not present

## 2015-07-12 DIAGNOSIS — Z79899 Other long term (current) drug therapy: Secondary | ICD-10-CM | POA: Insufficient documentation

## 2015-07-12 DIAGNOSIS — I480 Paroxysmal atrial fibrillation: Secondary | ICD-10-CM | POA: Diagnosis not present

## 2015-07-12 DIAGNOSIS — Z823 Family history of stroke: Secondary | ICD-10-CM | POA: Diagnosis not present

## 2015-07-12 DIAGNOSIS — Z7901 Long term (current) use of anticoagulants: Secondary | ICD-10-CM | POA: Insufficient documentation

## 2015-07-12 DIAGNOSIS — I5023 Acute on chronic systolic (congestive) heart failure: Secondary | ICD-10-CM | POA: Diagnosis not present

## 2015-07-12 DIAGNOSIS — I252 Old myocardial infarction: Secondary | ICD-10-CM | POA: Insufficient documentation

## 2015-07-12 DIAGNOSIS — I255 Ischemic cardiomyopathy: Secondary | ICD-10-CM | POA: Diagnosis not present

## 2015-07-12 DIAGNOSIS — Z951 Presence of aortocoronary bypass graft: Secondary | ICD-10-CM | POA: Diagnosis not present

## 2015-07-12 DIAGNOSIS — I5043 Acute on chronic combined systolic (congestive) and diastolic (congestive) heart failure: Secondary | ICD-10-CM | POA: Diagnosis not present

## 2015-07-12 DIAGNOSIS — J189 Pneumonia, unspecified organism: Secondary | ICD-10-CM | POA: Diagnosis not present

## 2015-07-12 DIAGNOSIS — K589 Irritable bowel syndrome without diarrhea: Secondary | ICD-10-CM | POA: Diagnosis not present

## 2015-07-12 MED ORDER — POTASSIUM CHLORIDE CRYS ER 20 MEQ PO TBCR: 20.0000 meq | EXTENDED_RELEASE_TABLET | Freq: Every day | ORAL | Status: DC

## 2015-07-12 MED ORDER — FUROSEMIDE 40 MG PO TABS: 40.0000 mg | ORAL_TABLET | Freq: Two times a day (BID) | ORAL | Status: DC

## 2015-07-12 NOTE — Patient Instructions (Signed)
DECREASE Beta Blocker per Research instructions.  INCREASE Lasix 40 mg twice daily.  START Potassium 20 meq (1 tab) once daily.  Return on Friday to follow up with Dr. [name].  Do the following things EVERYDAY: 1) Weigh yourself in the morning before breakfast. Write it down and keep it in a log. 2) Take your medicines as prescribed 3) Eat low salt foods-Limit salt (sodium) to 2000 mg per day.  4) Stay as active as you can everyday 5) Limit all fluids for the day to less than 2 liters

## 2015-07-12 NOTE — Progress Notes (Signed)
Patient ID: [name], male   DOB: Jun 06, 1940, 75 y.o.   MRN: [id number] Patient ID: [name], male   DOB: [birth year], 75 y.o.   MRN: [id number]  [facility]  Date:  07/12/2015   ID:  [name], [place]tti Apr 17, 1940, MRN [id number]  PCP:  [name], MD  Primary Cardiologist:   Tresa Endo Referring: Dr. [name] Kill   History of Present Illness: [name] is a 75 y.o. 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 In September 1998 he underwent CABG revascularization surgery after an unsuccessful attempt at stenting of his proximal LAD by Dr. Juanda Chance. Ejection fraction was 25-30%. In 2002 he underwent initial ICD implantation for nonsustained ventricular tachycardia documented on event monitor for primary prevention. In January 2010 he underwent generator change with a Medtronic Virtuoso single chamber cardioverter defibrillator. Additional problems include paroxysmal atrial fibrillation, occasional PVCs and an attempt was made in the past to overdrive suppress his PVCs with reducing his beta blocker therapy. He felt he had PVCs on the reduced dose and therefore has been on the higher dose.   A nuclear perfusion study in November 2013 showed a large area of scar in the LAD territory (extent 44%) involving the mid to apical anterior, apical and infero-apical to mid infero-septal and apical lateral wall without associated ischemia.  An echo Doppler study on 01/01/2013 revealed a mildly dilated LV. Ejection fraction was 25-30%. There was dyskinesis in scarring of the apical myocardium as well as akinesis of the mid anteroseptal, anterior and anterolateral walls consistent with his prior LAD infarction. He does have a history of paroxysmal atrial fibrillation.    He is on Coumadin anticoagulation for PAF. He was readmitted to the hospital from a May 2 through 06/04/2014 with recurrent atrial fibrillation  and started on Tikosyn. He has been enrolled in the genetic AF trial and is now off Tikosyn and on the study drug of either Bucindolol or metoprolol high dose.  He has been discussing CRT upgrade with Dr. Graciela Husbands and has intermediate predictors of benefit (LBBB with QRS almost 150 ms, but ischemic cardiomyopathy and male gender).    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 Lisinopril 2.5 recently titrated down from 20 and now stopped due to hypotension. Very fatigued. + orthopnea or PND. Feels lousy and lightheaded. Taking lasix 40 daily. Weigh down 1 pound.   Echo 5/17 EF 20% RV mildly down.    ICD interrogation: Volume up. Activity level was 2 hours per day and now 1 hour per day.     Wt Readings from Last 3 Encounters:  07/12/15 143 lb 12 oz (65.205 kg)  06/28/15 148 lb 8 oz (67.359 kg)  06/17/15 149 lb 6 oz (67.756 kg)     Past Medical History  Diagnosis Date  . Coronary artery disease     Hx MI 1992, CABG 1998 , Nuc study 11.2013 large scar but no ischemia  . 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 Paroxysmal atrial fibrillation (HCC)   . GERD (gastroesophageal reflux disease)   . H. pylori infection     Hx of   . Cardiomyopathy, ischemic 2011    with EF 25-35% by echo  . H/O myocardial infarction, greater than 8 weeks 1992    large ant wall injury  . NSVT (  nonsustained ventricular tachycardia) (HCC)   . Acute on chronic systolic CHF (congestive heart failure) (HCC) 06/09/2015    Current Outpatient Prescriptions  Medication Sig Dispense Refill  . acetaminophen (TYLENOL) 650 MG CR tablet Take 650 mg by mouth 2 (two) times daily.     . AMBULATORY NON FORMULARY MEDICATION Take 1 tablet by mouth 2 (two) times daily. Medication Name: GENETIC-AF study drug Metoprolol XL 200 mg QD vs Bucindolol 50 mg BID    . aspirin 81 MG chewable tablet Chew 1 tablet (81 mg total) by  mouth on Monday, Wednesday, & Friday. 12 tablet 11  . cetirizine (ZYRTEC) 10 MG tablet Take 10 mg by mouth daily.      . Cholecalciferol (VITAMIN D) 2000 UNITS CAPS Take 1 capsule by mouth daily.    Marland Kitchen dicyclomine (BENTYL) 10 MG/5ML syrup Take 10 mg by mouth 2 (two) times daily as needed (IBS).     . furosemide (LASIX) 40 MG tablet Take 1 tablet (40 mg total) by mouth daily. Take 40 mg daily, additional 40 mg if weight gain of >3lbs in 24hrs or >5lbs in 1 week 60 tablet 3  . glycopyrrolate (ROBINUL) 2 MG tablet Take 2 mg by mouth 2 (two) times daily as needed (stomach).    . isosorbide mononitrate (IMDUR) 30 MG 24 hr tablet Take 15 mg by mouth daily.    . magnesium oxide (MAG-OX) 400 MG tablet Take 400 mg by mouth daily.    . mometasone (ASMANEX) 220 MCG/INH inhaler Inhale 1 puff into the lungs daily.     . mometasone (NASONEX) 50 MCG/ACT nasal spray Place 2 sprays into the nose daily as needed (allergies).     . montelukast (SINGULAIR) 10 MG tablet Take 1 tablet (10 mg total) by mouth at bedtime. PLEASE SCHEDULE APPOINTMENT. 30 tablet 0  . nitroGLYCERIN (NITROSTAT) 0.4 MG SL tablet Place 1 tablet (0.4 mg total) under the tongue every 5 (five) minutes as needed for chest pain. 25 tablet 1  . omeprazole (PRILOSEC) 20 MG capsule Take 1 capsule (20 mg total) by mouth 2 (two) times daily before a meal. 180 capsule 3  . saccharomyces boulardii (FLORASTOR) 250 MG capsule Take 1 capsule (250 mg total) by mouth 2 (two) times daily. 60 capsule 5  . silodosin (RAPAFLO) 4 MG CAPS capsule Take 8 mg by mouth See admin instructions. Every 2 days    . simvastatin (ZOCOR) 40 MG tablet TAKE ONE TABLET BY MOUTH AT BEDTIME 90 tablet 3  . spironolactone (ALDACTONE) 25 MG tablet Take 0.5 tablets (12.5 mg total) by mouth daily. 30 tablet 11  . warfarin (COUMADIN) 5 MG tablet Take 1 tablet by mouth daily or as directed by coumadin clinic (Patient taking differently: Take 2.5-5 mg by mouth daily. Take 2.5mg  on Monday,  Wednesday and Friday. All other days take 5mg ) 90 tablet 0   No current facility-administered medications for this encounter.    Allergies:    Allergies  Allergen Reactions  . Amoxicillin Rash  . Penicillins Rash    Social History:  The patient  reports that he has never smoked. He has never used smokeless tobacco. He reports that he drinks about 1.2 oz of alcohol per week. He reports that he does not use illicit drugs.   Family history:   Family History  Problem Relation Age of Onset  . Colon cancer Neg Hx   . Heart disease Father     questionable  . Stroke Mother   . Heart  failure Sister   . Healthy Brother   . Healthy Sister   . Parkinsonism Brother     ROS:  Please see the history of present illness.  All other systems reviewed and negative.   PHYSICAL EXAM: VS:  BP 95/61 mmHg  Pulse 77  Resp 20  Wt 143 lb 12 oz (65.205 kg)  SpO2 95% Well nourished, well developed, in no acute distress HEENT: Pupils are equal round react to light accommodation extraocular movements are intact.  Neck: JVP 8-9 prominent CV wavesNo cervical lymphadenopathy. Cardiac: Irregular rate and rhythm without murmurs rubs or gallops. Lungs:  clear to auscultation bilaterally, no wheezing, rhonchi or rales Abd: soft, nontender, mildly distended positive bowel sounds all quadrants, no hepatosplenomegaly Ext: no lower extremity edema.  2+ radial and dorsalis pedis pulses. Skin: warm and dry Neuro:  Grossly normal    ASSESSMENT AND PLAN: 1. Acute on chronic systolic HF due to iCM, EF 2-25% --NYHA IV. Volume overloaded --ACE recently stopped due to hypotension 2. PAF --In Genetic AF trial --AC with warfarin 3. CAD s/p previous anterior infarct 4. LBBB  He has class IV HF with volume overload in the setting of a severe ischemic CM EF 20%. He has had his b-blocker aggressively titrated in the setting of the Genetic AF trial and his RAAS inhibition has had to be weaned off. I suspect he is  significantly over b-blocked and now has decompensated HF.   Will cut b-blocker in half (may eventually need to stop). Increase lasix to 40 bid and add kcl 20. Will see back on Friday. If not improved may need hospital admission. If improving will try to restart low-dose RAAS inhibition (possibly with spiro 12.5). May need work-up for advanced therapies soon. ECG today showed NSR with PACs.  Total time spent 45 minutes. Over half that time spent discussing above.    Emilea Goga,MD 12:03 AM

## 2015-07-13 ENCOUNTER — Other Ambulatory Visit: Payer: Self-pay | Admitting: *Deleted

## 2015-07-13 ENCOUNTER — Encounter: Payer: Self-pay | Admitting: Gastroenterology

## 2015-07-13 ENCOUNTER — Ambulatory Visit (INDEPENDENT_AMBULATORY_CARE_PROVIDER_SITE_OTHER): Payer: Medicare Other | Admitting: Gastroenterology

## 2015-07-13 VITALS — BP 90/58 | HR 70 | Ht 67.0 in | Wt 145.0 lb

## 2015-07-13 DIAGNOSIS — K219 Gastro-esophageal reflux disease without esophagitis: Secondary | ICD-10-CM

## 2015-07-13 DIAGNOSIS — I255 Ischemic cardiomyopathy: Secondary | ICD-10-CM | POA: Diagnosis not present

## 2015-07-13 DIAGNOSIS — R1011 Right upper quadrant pain: Secondary | ICD-10-CM | POA: Diagnosis not present

## 2015-07-13 MED ORDER — AMBULATORY NON FORMULARY MEDICATION: 1.0000 | Freq: Two times a day (BID) | Status: DC

## 2015-07-13 MED ORDER — ESOMEPRAZOLE MAGNESIUM 40 MG PO CPDR: 40.0000 mg | DELAYED_RELEASE_CAPSULE | Freq: Every day | ORAL | Status: DC

## 2015-07-13 NOTE — Patient Instructions (Signed)
We have given you a prescription for Nexium 40 mg one pill daily.   You have been scheduled for an abdominal ultrasound at [facility] (1st floor of hospital) on 07/19/15 at 8 am. Please arrive 15 minutes prior to your appointment for registration. Make certain not to have anything to eat or drink 6 hours prior to your appointment. Should you need to reschedule your appointment, please contact radiology at [phone]. This test typically takes about 30 minutes to perform.

## 2015-07-13 NOTE — Progress Notes (Signed)
Agree with assessment and plan as noted. Agree that CT would be the first choice for imaging given his multiple pains, but will await [name] if he declined CT.

## 2015-07-13 NOTE — Progress Notes (Signed)
     07/13/2015 JULIANNA CUARTAS [id number] 08/21/1940   History of Present Illness:  From OV 06/17/2015:  This is a 75 year old male known to [name] for treatment of his GERD and irritable bowel.  He was recently hospitalized for couple of days and just discharged 3 days ago for complaints of right upper quadrant abdominal pain and acute on chronic CHF.  He underwent some gallbladder evaluation was determined that maybe he had some chronic acalculous cholecystitis, but they opted to treat it with antibiotics since surgery is high risk. Thoughts were that this was either causing his right upper quadrant abdominal pain or possibly pressure from his acute heart failure.  Pain improved and overall has resolved (? If antibiotics helped or if it just got better with time).  He is here today for follow-up.  Pain is still better.  Complaining of bloating related to IBS.    At last visit he was feeling better except for bloating.  Was going to start a daily probiotic and try IBgard for bloating and use antispasmodics prn.  He is here today with complaints of worsening GERD and upper abdominal pain.  Has been using over-the-counter omeprazole but feels like the prescription Nexium helped a lot more previously. He says that overall his reflux is a terrible but feels like it could be better controlled.  Complaining of discomfort across his upper abdomen.  Current Medications, Allergies, Past Medical History, Past Surgical History, Family History and Social History were reviewed in [employer] record.   Physical Exam: BP 90/58 mmHg  Pulse 70  Ht 5\' 7"  (1.702 m)  Wt 145 lb (65.772 kg)  BMI 22.71 kg/m2 General: Well developed male in no acute distress Head: Normocephalic and atraumatic Eyes:  Sclerae anicteric, conjunctiva pink  Ears: Normal auditory acuity Lungs: Clear throughout to auscultation Heart: Regular rate and rhythm Abdomen: Soft, non-distended.  Normal bowel  sounds.  RUQ TTP. Musculoskeletal: Symmetrical with no gross deformities  Extremities: No edema  Neurological: Alert oriented x 4, grossly non-focal Psychological:  Alert and cooperative. Normal mood and affect  Assessment and Recommendations: -RUQ abdominal pain/upper abdominal pain:  Recurrent. ? If this was pressure related to acute on chronic heart failure versus early acute cholecystitis improved with antibiotics initially.  Back again with localized RUQ discomfort but also complaints of diffuse upper abdominal pain.  Offered CT scan, but patient declined.  I was then able to convince him of repeat ultrasound for now.   -GERD:  Feels like prescription Nexium helped better previously.  Will restart this instead of using OTC PPI. -IBS:  Suggested trying Robinul on a more regular basis.

## 2015-07-14 NOTE — Progress Notes (Signed)
Jolaine Artist, MD   Sent: Wed July 13, 2015 11:05 PM    To: [name], RN        Message     Diuretics increased. thanks

## 2015-07-15 ENCOUNTER — Encounter (HOSPITAL_COMMUNITY): Payer: Self-pay | Admitting: Internal Medicine

## 2015-07-15 ENCOUNTER — Other Ambulatory Visit: Payer: Self-pay | Admitting: *Deleted

## 2015-07-15 ENCOUNTER — Ambulatory Visit (HOSPITAL_COMMUNITY)
Admission: RE | Admit: 2015-07-15 | Discharge: 2015-07-15 | Disposition: A | Discharge status: Home / Self Care | Payer: Medicare Other | Source: Ambulatory Visit | Attending: Internal Medicine | Admitting: Internal Medicine

## 2015-07-15 VITALS — BP 102/70 | HR 75 | Wt 144.0 lb

## 2015-07-15 DIAGNOSIS — Z7982 Long term (current) use of aspirin: Secondary | ICD-10-CM | POA: Diagnosis not present

## 2015-07-15 DIAGNOSIS — I5043 Acute on chronic combined systolic (congestive) and diastolic (congestive) heart failure: Secondary | ICD-10-CM

## 2015-07-15 DIAGNOSIS — I5023 Acute on chronic systolic (congestive) heart failure: Secondary | ICD-10-CM | POA: Diagnosis not present

## 2015-07-15 DIAGNOSIS — I447 Left bundle-branch block, unspecified: Secondary | ICD-10-CM | POA: Diagnosis not present

## 2015-07-15 DIAGNOSIS — I480 Paroxysmal atrial fibrillation: Secondary | ICD-10-CM

## 2015-07-15 DIAGNOSIS — Z951 Presence of aortocoronary bypass graft: Secondary | ICD-10-CM | POA: Insufficient documentation

## 2015-07-15 DIAGNOSIS — I255 Ischemic cardiomyopathy: Secondary | ICD-10-CM | POA: Insufficient documentation

## 2015-07-15 DIAGNOSIS — Z95810 Presence of automatic (implantable) cardiac defibrillator: Secondary | ICD-10-CM | POA: Insufficient documentation

## 2015-07-15 DIAGNOSIS — Z880 Allergy status to penicillin: Secondary | ICD-10-CM | POA: Diagnosis not present

## 2015-07-15 DIAGNOSIS — Z7901 Long term (current) use of anticoagulants: Secondary | ICD-10-CM | POA: Insufficient documentation

## 2015-07-15 DIAGNOSIS — Z823 Family history of stroke: Secondary | ICD-10-CM | POA: Diagnosis not present

## 2015-07-15 DIAGNOSIS — M1711 Unilateral primary osteoarthritis, right knee: Secondary | ICD-10-CM | POA: Diagnosis not present

## 2015-07-15 DIAGNOSIS — Z8249 Family history of ischemic heart disease and other diseases of the circulatory system: Secondary | ICD-10-CM | POA: Diagnosis not present

## 2015-07-15 DIAGNOSIS — I2510 Atherosclerotic heart disease of native coronary artery without angina pectoris: Secondary | ICD-10-CM | POA: Insufficient documentation

## 2015-07-15 DIAGNOSIS — I5042 Chronic combined systolic (congestive) and diastolic (congestive) heart failure: Secondary | ICD-10-CM

## 2015-07-15 DIAGNOSIS — I252 Old myocardial infarction: Secondary | ICD-10-CM | POA: Insufficient documentation

## 2015-07-15 DIAGNOSIS — Z8719 Personal history of other diseases of the digestive system: Secondary | ICD-10-CM | POA: Insufficient documentation

## 2015-07-15 DIAGNOSIS — K219 Gastro-esophageal reflux disease without esophagitis: Secondary | ICD-10-CM | POA: Diagnosis not present

## 2015-07-15 DIAGNOSIS — I472 Ventricular tachycardia: Secondary | ICD-10-CM | POA: Insufficient documentation

## 2015-07-15 LAB — BRAIN NATRIURETIC PEPTIDE: B NATRIURETIC PEPTIDE 5: 824.7 pg/mL — AB (ref 0.0–100.0)

## 2015-07-15 LAB — BASIC METABOLIC PANEL
ANION GAP: 7 (ref 5–15)
BUN: 11 mg/dL (ref 6–20)
CHLORIDE: 102 mmol/L (ref 101–111)
CO2: 26 mmol/L (ref 22–32)
Calcium: 9.2 mg/dL (ref 8.9–10.3)
Creatinine, Ser: 1.05 mg/dL (ref 0.61–1.24)
GFR calc Af Amer: >60 mL/min (ref >60)
GLUCOSE: 114 mg/dL — AB (ref 65–99)
POTASSIUM: 3.4 mmol/L — AB (ref 3.5–5.1)
Sodium: 135 mmol/L (ref 135–145)

## 2015-07-15 MED ORDER — SPIRONOLACTONE 25 MG PO TABS: 25.0000 mg | ORAL_TABLET | Freq: Every day | ORAL | Status: DC

## 2015-07-15 NOTE — Patient Outreach (Signed)
[facility]) Care Management  07/15/2015  [name] 08/29/1940 [id number]   RN spoke with pt today who indicates he is doing well however up coming appointments. RN relay all appointments noted in EPIC for pt's ongoing adherence. Verified pt adherence with all of his medications with no reported problems and pt remains in the GREEN zone concerning his HF. Pt denies any symptoms and continues to report his daily weights with today at 138 lbs, yesterday 139 lbs and last week at 140 lbs. RN reviewed the pt's plan of care and based upon meeting all goals and pt opt to decline any additional community for ongoing education concerning his HF pt will be discharged from the [facility] program with a completed of his transition of care contacts over the last 30 days. Will notify pt's provider and request case closures.  [name], RN Care Management Coordinator [place] Office [phone]

## 2015-07-15 NOTE — Progress Notes (Signed)
Advanced Heart Failure Medication Review by a Pharmacist  Does the patient  feel that his/her medications are working for him/her?  no  Has the patient been experiencing any side effects to the medications prescribed?  no  Does the patient measure his/her own blood pressure or blood glucose at home?  Yes, states it is "normal"  Does the patient have any problems obtaining medications due to transportation or finances?   no  Understanding of regimen: excellent Understanding of indications: excellent Potential of compliance: excellent Patient understands to avoid NSAIDs. Patient understands to avoid decongestants.   Pharmacist comments: Mr. Kohlmeyer is a very pleasant 29 yom presenting to the clinic for a follow-up appointment. He saw Dr. [name] last week and was volume overloaded. His lasix was increased and he is being seen again in clinic today. Mr. Hammontree states that he is not currently experiencing any medication-related side effects and never misses any doses of his medications. He has a very thorough understanding of his medication regimen. He denies any dizziness, swelling, or SOB. He did not have any other medication-related questions at this time.   He is currently enrolled in the genetic AF trial an on high-dose beta blocker therapy (either Toprol 200mg  daily or bucindilol 50mg  BID),   Verlyn Lambert C. Lennox Grumbles, PharmD Pharmacy Resident  Pager: [phone] 07/15/2015 10:10 AM   Time with patient: 20 min  Preparation and documentation time: 2 min  Total time: 22 min

## 2015-07-15 NOTE — Progress Notes (Signed)
Patient ID: [name], male   DOB: [birth year], 75 y.o.   MRN: [id number]  [facility]  Date:  07/15/2015   ID:  [name] January 30, 1941, MRN [id number]  PCP:  Jason Frees, MD  Primary Cardiologist:   [name] Referring: Dr. Lia [name]   History of Present Illness: [name] is a 75 y.o. 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 In September 1998 he underwent CABG revascularization surgery after an unsuccessful attempt at stenting of his proximal LAD by Dr. Olevia [name]. Ejection fraction was 25-30%. In 2002 he underwent initial ICD implantation for nonsustained ventricular tachycardia documented on event monitor for primary prevention. In January 2010 he underwent generator change with a Medtronic Virtuoso single chamber cardioverter defibrillator. Additional problems include paroxysmal atrial fibrillation, occasional PVCs and an attempt was made in the past to overdrive suppress his PVCs with reducing his beta blocker therapy. He felt he had PVCs on the reduced dose and therefore has been on the higher dose.   A nuclear perfusion study in November 2013 showed a large area of scar in the LAD territory (extent 44%) involving the mid to apical anterior, apical and infero-apical to mid infero-septal and apical lateral wall without associated ischemia.  An echo Doppler study on 01/01/2013 revealed a mildly dilated LV. Ejection fraction was 25-30%. There was dyskinesis in scarring of the apical myocardium as well as akinesis of the mid anteroseptal, anterior and anterolateral walls consistent with his prior LAD infarction. He does have a history of paroxysmal atrial fibrillation.    He is on Coumadin anticoagulation for PAF. He was readmitted to the hospital from a May 2 through 06/04/2014 with recurrent atrial fibrillation and started on Tikosyn. He has been enrolled in the genetic AF trial and is  now off Tikosyn and on the study drug of either Bucindolol or metoprolol high dose.  He has been discussing CRT upgrade with Dr. Caryl [name] and has intermediate predictors of benefit (LBBB with QRS almost 150 ms, but ischemic cardiomyopathy and male gender).    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 Lisinopril 2.5 recently titrated down from 20 and now stopped due to hypotension.    We saw him earlier in the week. Feeling very poorly. b-blocker cut in half. Lasix increased to 40 bid. Feels a bit better. Weight down 2 pounds. Still with cough. Still with mild orthopnea. SBP 90-100 at home   Echo 5/17 EF 20% RV mildly down.    ICD interrogation: Volume still up. Activity level picking up slightly. No VT     Wt Readings from Last 3 Encounters:  07/15/15 144 lb (65.318 kg)  07/13/15 145 lb (65.772 kg)  07/12/15 143 lb 12 oz (65.205 kg)     Past Medical History  Diagnosis Date  . Coronary artery disease     Hx MI 1992, CABG 1998 , Nuc study 11.2013 large scar but no ischemia  . 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 Paroxysmal atrial fibrillation (HCC)   . GERD (gastroesophageal reflux disease)   . H. pylori infection     Hx of   . Cardiomyopathy, ischemic 2011    with EF 25-35% by echo  . H/O myocardial infarction, greater than 8 weeks 1992    large ant wall injury  . NSVT (nonsustained ventricular  tachycardia) ([place])   . Acute on chronic systolic CHF (congestive heart failure) ([place]) 06/09/2015    Current Outpatient Prescriptions  Medication Sig Dispense Refill  . acetaminophen (TYLENOL) 650 MG CR tablet Take 650 mg by mouth 2 (two) times daily.     Marland Kitchen aspirin 81 MG chewable tablet Chew 1 tablet (81 mg total) by mouth on Monday, Wednesday, & Friday. 12 tablet 11  . cetirizine (ZYRTEC) 10 MG tablet Take 10 mg by mouth daily.      . Cholecalciferol (VITAMIN D) 2000 UNITS  CAPS Take 1 capsule by mouth daily.    Marland Kitchen dicyclomine (BENTYL) 10 MG capsule Take 10 mg by mouth 2 (two) times daily.    Marland Kitchen esomeprazole (NEXIUM) 40 MG capsule Take 1 capsule (40 mg total) by mouth daily at 12 noon. 30 capsule 3  . furosemide (LASIX) 40 MG tablet Take 1 tablet (40 mg total) by mouth 2 (two) times daily. 180 tablet 3  . glycopyrrolate (ROBINUL) 2 MG tablet Take 2 mg by mouth 2 (two) times daily as needed (stomach).    . isosorbide mononitrate (IMDUR) 30 MG 24 hr tablet Take 15 mg by mouth daily.    Marland Kitchen LORazepam (ATIVAN) 1 MG tablet Take 0.5 mg by mouth at bedtime.    . magnesium oxide (MAG-OX) 400 MG tablet Take 400 mg by mouth daily.    . mometasone (ASMANEX) 220 MCG/INH inhaler Inhale 1 puff into the lungs daily.     . montelukast (SINGULAIR) 10 MG tablet Take 1 tablet (10 mg total) by mouth at bedtime. PLEASE SCHEDULE APPOINTMENT. 30 tablet 0  . omeprazole (PRILOSEC) 20 MG capsule Take 1 capsule (20 mg total) by mouth 2 (two) times daily before a meal. 180 capsule 3  . potassium chloride SA (KLOR-CON M20) 20 MEQ tablet Take 1 tablet (20 mEq total) by mouth daily. 90 tablet 3  . silodosin (RAPAFLO) 4 MG CAPS capsule Take 8 mg by mouth See admin instructions. Every 2 days    . simvastatin (ZOCOR) 40 MG tablet TAKE ONE TABLET BY MOUTH AT BEDTIME 90 tablet 3  . spironolactone (ALDACTONE) 25 MG tablet Take 0.5 tablets (12.5 mg total) by mouth daily. 30 tablet 11  . warfarin (COUMADIN) 5 MG tablet Take 1 tablet by mouth daily or as directed by coumadin clinic (Patient taking differently: Take 2.5-5 mg by mouth daily. Take 2.5mg  on Monday, Wednesday and Friday. All other days take 5mg ) 90 tablet 0  . AMBULATORY NON FORMULARY MEDICATION Take 1 tablet by mouth 2 (two) times daily. Medication Name: GENETIC-AF study drug Metoprolol XL 100 mg QD vs Bucindolol 25 mg BID    . nitroGLYCERIN (NITROSTAT) 0.4 MG SL tablet Place 1 tablet (0.4 mg total) under the tongue every 5 (five) minutes as needed  for chest pain. (Patient not taking: Reported on 07/15/2015) 25 tablet 1   No current facility-administered medications for this encounter.    Allergies:    Allergies  Allergen Reactions  . Amoxicillin Rash  . Penicillins Rash    Social History:  The patient  reports that he has never smoked. He has never used smokeless tobacco. He reports that he drinks about 1.2 oz of alcohol per week. He reports that he does not use illicit drugs.   Family history:   Family History  Problem Relation Age of Onset  . Colon cancer Neg Hx   . Heart disease Father     questionable  . Stroke Mother   . Heart  failure Sister   . Healthy Brother   . Healthy Sister   . Parkinsonism Brother     ROS:  Please see the history of present illness.  All other systems reviewed and negative.   PHYSICAL EXAM: VS:  BP 102/70 mmHg  Pulse 75  Wt 144 lb (65.318 kg)  SpO2 93% Well nourished, well developed, in no acute distress HEENT: Pupils are equal round react to light accommodation extraocular movements are intact.  Neck: JVP 6 prominent CV wavesNo cervical lymphadenopathy. Cardiac: Irregular rate and rhythm without murmurs rubs or gallops. Lungs:  clear to auscultation bilaterally, no wheezing, rhonchi or rales Abd: soft, nontender, mildly distended positive bowel sounds all quadrants, no hepatosplenomegaly Ext: no lower extremity edema.  2+ radial and dorsalis pedis pulses. Skin: warm and dry Neuro:  Grossly normal   ASSESSMENT AND PLAN: 1. Acute on chronic systolic HF due to iCM, EF 20-25% --NYHA IV. Volume overloaded --ACE recently stopped due to hypotension 2. PAF --In Genetic AF trial --AC with warfarin 3. CAD s/p previous anterior infarct 4. LBBB  He is improved but still with some symptoms and volume overload. Will continue lasix 40 bid. Cut b-blocker in half again. Increase spiro to 25 and watch BP closely. Would like to get ACE or entresto on board soon as BP tolerates. RTC in 2 weeks.   May need work-up for advanced therapies soon. Labs today.  Long discussion with family about Genetic AF trial as well as process of work-up for advanced HF therapies.  Total time spent 45 minutes. Over half that time spent discussing above.    Bensimhon, Daniel,MD 10:01 AM

## 2015-07-15 NOTE — Patient Instructions (Signed)
INCREASE Spironolactone to 25 mg, one tab daily  Labs today  Your physician recommends that you schedule a follow-up appointment in: 2 weeks with Dr. [name]  Do the following things EVERYDAY: 1) Weigh yourself in the morning before breakfast. Write it down and keep it in a log. 2) Take your medicines as prescribed 3) Eat low salt foods-Limit salt (sodium) to 2000 mg per day.  4) Stay as active as you can everyday 5) Limit all fluids for the day to less than 2 liters 6)

## 2015-07-18 ENCOUNTER — Telehealth: Payer: Self-pay | Admitting: Internal Medicine

## 2015-07-18 ENCOUNTER — Ambulatory Visit (INDEPENDENT_AMBULATORY_CARE_PROVIDER_SITE_OTHER): Payer: Medicare Other | Admitting: Pharmacist

## 2015-07-18 DIAGNOSIS — I2510 Atherosclerotic heart disease of native coronary artery without angina pectoris: Secondary | ICD-10-CM | POA: Diagnosis not present

## 2015-07-18 DIAGNOSIS — J189 Pneumonia, unspecified organism: Secondary | ICD-10-CM | POA: Diagnosis not present

## 2015-07-18 DIAGNOSIS — I255 Ischemic cardiomyopathy: Secondary | ICD-10-CM | POA: Diagnosis not present

## 2015-07-18 DIAGNOSIS — I5023 Acute on chronic systolic (congestive) heart failure: Secondary | ICD-10-CM | POA: Diagnosis not present

## 2015-07-18 DIAGNOSIS — I480 Paroxysmal atrial fibrillation: Secondary | ICD-10-CM

## 2015-07-18 DIAGNOSIS — K589 Irritable bowel syndrome without diarrhea: Secondary | ICD-10-CM | POA: Diagnosis not present

## 2015-07-18 DIAGNOSIS — Z7901 Long term (current) use of anticoagulants: Secondary | ICD-10-CM

## 2015-07-18 LAB — POCT INR: INR: 2.7

## 2015-07-18 NOTE — Telephone Encounter (Signed)
New message    The pt called the home health nurse complaining abd pain and sob  Pt c/o Shortness Of Breath: STAT if SOB developed within the last 24 hours or pt is noticeably SOB on the phone  1. Are you currently SOB (can you hear that pt is SOB on the phone)? Per home health nurse no  2. How long have you been experiencing SOB? Last week and this morning the pt has had to sleep in chair per home health  3. Are you SOB when sitting or when up moving around?sitting down  4. Are you currently experiencing any other symptoms? abd pain with sob worse on right side vital sign 100/66 bp in left arm and 98/68 standing left arm o2Sat 98% on room air. The pt right ankle is swollen per home health nurse are swollen.   Pt c/o swelling: STAT is pt has developed SOB within 24 hours  1. How long have you been experiencing swelling? Per home health about last week in the ankles  2. Where is the swelling located? In both ankle and the stomach, in the right foot swollen per home health  3.  Are you currently taking a "fluid pill"? Yes   4.  Are you currently SOB? Now while sitting per home health  5.  Have you traveled recently?no   The pt weight for today is 138 the weight is usually 139 per home health, per home health nurse if she doesn't answer you can leave a voicemail.

## 2015-07-19 ENCOUNTER — Encounter: Payer: Self-pay | Admitting: Internal Medicine

## 2015-07-19 ENCOUNTER — Other Ambulatory Visit: Payer: Self-pay | Admitting: *Deleted

## 2015-07-19 ENCOUNTER — Telehealth (HOSPITAL_COMMUNITY): Payer: Self-pay | Admitting: *Deleted

## 2015-07-19 ENCOUNTER — Ambulatory Visit (HOSPITAL_COMMUNITY): Payer: Medicare Other

## 2015-07-19 ENCOUNTER — Ambulatory Visit (INDEPENDENT_AMBULATORY_CARE_PROVIDER_SITE_OTHER): Payer: Medicare Other | Admitting: Internal Medicine

## 2015-07-19 VITALS — BP 98/68 | HR 74 | Ht 67.0 in | Wt 143.2 lb

## 2015-07-19 DIAGNOSIS — I480 Paroxysmal atrial fibrillation: Secondary | ICD-10-CM | POA: Diagnosis not present

## 2015-07-19 DIAGNOSIS — I5022 Chronic systolic (congestive) heart failure: Secondary | ICD-10-CM

## 2015-07-19 DIAGNOSIS — I255 Ischemic cardiomyopathy: Secondary | ICD-10-CM

## 2015-07-19 DIAGNOSIS — Z95810 Presence of automatic (implantable) cardiac defibrillator: Secondary | ICD-10-CM

## 2015-07-19 DIAGNOSIS — R001 Bradycardia, unspecified: Secondary | ICD-10-CM

## 2015-07-19 LAB — CUP PACEART INCLINIC DEVICE CHECK
Battery Voltage: 2.72 V
Brady Statistic RV Percent Paced: 0 %
HIGH POWER IMPEDANCE MEASURED VALUE: 72 Ohm
HighPow Impedance: 48 Ohm
Implantable Lead Location: 753860
Implantable Lead Model: 147
Implantable Lead Serial Number: 104581
Lead Channel Impedance Value: 1040 Ohm
Lead Channel Setting Pacing Pulse Width: 0.7 ms
Lead Channel Setting Sensing Sensitivity: 0.3 mV
MDC IDC LEAD IMPLANT DT: 20010806
MDC IDC MSMT LEADCHNL RV PACING THRESHOLD AMPLITUDE: 1 V
MDC IDC MSMT LEADCHNL RV PACING THRESHOLD PULSEWIDTH: 0.7 ms
MDC IDC MSMT LEADCHNL RV SENSING INTR AMPL: 13.645 mV
MDC IDC SESS DTM: 20170620164123
MDC IDC SET LEADCHNL RV PACING AMPLITUDE: 2.5 V

## 2015-07-19 MED ORDER — AMBULATORY NON FORMULARY MEDICATION: 1.0000 | Freq: Two times a day (BID) | Status: DC

## 2015-07-19 MED ORDER — METOLAZONE 2.5 MG PO TABS: ORAL_TABLET | ORAL | Status: DC

## 2015-07-19 NOTE — Progress Notes (Signed)
Patient Care Team: [name], MD as PCP - General (Family Medicine)   HPI  [name] is a 75 y.o. 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 He has a history of bypass surgery.  His last nuclear perfusion study was in November 2013 which showed a large area of scar in the LAD territory (extent 44%) involving the mid to apical anterior, apical and infero-apical to mid infero-septal and apical lateral wall without associated ischemia. His last echo Doppler study was done on 01/01/2013. His left ventricle was mildly dilated. Ejection fraction was 25-30%. There was dyskinesis in scarring of the apical myocardium as well as akinesis of the mid anteroseptal, anterior and anterolateral walls consistent with his prior LAD infarction.    He has a history of paroxysmal atrial fibrillation and is on warfarin. hhe's hospitalized 5/16 and was started on dofetilide. This was subsequently discontinued   He was subsequently started on bucindolol as part of the AF trial  He was hospitalized 5/17 with cholecystitis and pneumonia; echocardiogram at that time EF 20%  He was seen at the heart failure clinic 6/17;  there was concern about over beta blockade. His medications were adjusted by Dr. Reine Just. Diuretics were increased Aldactone added.  His ECG demonstrates nonspecific IVCD QRS duration 144 ms and notably, in the context of Dr. Daiva Nakayama 12/16 heart rhythm, there is no notching in the mid precordial leads     Past Medical History  Diagnosis Date  . Coronary artery disease     Hx MI 1992, CABG 1998 , Nuc study 11.2013 large scar but no ischemia  . 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 Paroxysmal atrial fibrillation (HCC)   . GERD (gastroesophageal reflux disease)   . H. pylori infection     Hx of   . Cardiomyopathy, ischemic 2011    with EF 25-35% by echo  . H/O myocardial  infarction, greater than 8 weeks 1992    large ant wall injury  . NSVT (nonsustained ventricular tachycardia) ([place])   . Acute on chronic systolic CHF (congestive heart failure) ([place]) 06/09/2015    Past Surgical History  Procedure Laterality Date  . Nasal sinus surgery  05/31/2010    Dr. Benjamine Mola  . Coronary artery bypass graft  1998    x 5  . Cataract extraction, bilateral    . Knee surgery      right  . Hernia repair  1960's    right inguinal  . Icd generator change  01/2008    medtronic, hx+ EP study    Current Outpatient Prescriptions  Medication Sig Dispense Refill  . acetaminophen (TYLENOL) 650 MG CR tablet Take 650 mg by mouth 2 (two) times daily.     . AMBULATORY NON FORMULARY MEDICATION Take 1 tablet by mouth 2 (two) times daily. Medication Name: Genetic AF Research Study, drug provided.  Toprol XL 50mg  vs bucindolol 12.5 mg.    . aspirin 81 MG chewable tablet Chew 1 tablet (81 mg total) by mouth on Monday, Wednesday, & Friday. 12 tablet 11  . cetirizine (ZYRTEC) 10 MG tablet Take 10 mg by mouth daily.      . Cholecalciferol (VITAMIN D) 2000 UNITS CAPS Take 1 capsule by mouth daily.    Marland Kitchen dicyclomine (BENTYL) 10 MG capsule Take 10 mg by mouth 2 (two) times daily.    Marland Kitchen esomeprazole (NEXIUM) 40 MG capsule  Take 1 capsule (40 mg total) by mouth daily at 12 noon. 30 capsule 3  . furosemide (LASIX) 40 MG tablet Take 1 tablet (40 mg total) by mouth 2 (two) times daily. 180 tablet 3  . glycopyrrolate (ROBINUL) 2 MG tablet Take 2 mg by mouth 2 (two) times daily as needed (stomach).    . isosorbide mononitrate (IMDUR) 30 MG 24 hr tablet Take 15 mg by mouth daily.    Marland Kitchen LORazepam (ATIVAN) 1 MG tablet Take 0.5 mg by mouth at bedtime.    . magnesium oxide (MAG-OX) 400 MG tablet Take 400 mg by mouth daily.    . mometasone (ASMANEX) 220 MCG/INH inhaler Inhale 1 puff into the lungs daily.     . montelukast (SINGULAIR) 10 MG tablet Take 1 tablet (10 mg total) by mouth at bedtime. PLEASE SCHEDULE  APPOINTMENT. 30 tablet 0  . nitroGLYCERIN (NITROSTAT) 0.4 MG SL tablet Place 1 tablet (0.4 mg total) under the tongue every 5 (five) minutes as needed for chest pain. 25 tablet 1  . omeprazole (PRILOSEC) 20 MG capsule Take 1 capsule (20 mg total) by mouth 2 (two) times daily before a meal. 180 capsule 3  . potassium chloride SA (KLOR-CON M20) 20 MEQ tablet Take 1 tablet (20 mEq total) by mouth daily. 90 tablet 3  . silodosin (RAPAFLO) 4 MG CAPS capsule Take 8 mg by mouth See admin instructions. Every 2 days    . simvastatin (ZOCOR) 40 MG tablet TAKE ONE TABLET BY MOUTH AT BEDTIME 90 tablet 3  . spironolactone (ALDACTONE) 25 MG tablet Take 1 tablet (25 mg total) by mouth daily. 30 tablet 11  . warfarin (COUMADIN) 5 MG tablet Take 1 tablet by mouth daily or as directed by coumadin clinic (Patient taking differently: Take 2.5-5 mg by mouth daily. Take 2.5mg  on Monday, Wednesday and Friday. All other days take 5mg ) 90 tablet 0   No current facility-administered medications for this visit.    Allergies  Allergen Reactions  . Amoxicillin Rash  . Penicillins Rash    Review of Systems negative except from HPI and PMH  Physical Exam BP 98/68 mmHg  Pulse 74  Ht 5\' 7"  (1.702 m)  Wt 143 lb 3.2 oz (64.955 kg)  BMI 22.42 kg/m2  SpO2 95% Well developed and well nourished in no acute distress HENT normal E scleral and icterus clear Neck Supple JVP 8 carotids brisk and full Clear to ausculation Slwo but Regular rate and rhythm, no murmurs gallops or rub Soft with active bowel sounds No clubbing cyanosis 1+ Edema Alert and oriented, grossly normal motor and sensory function Skin Warm and Dry  ECG demonstrates sinus rhythm at 66 Intervals 16/14/45 Axis -66  IVCD Assessment and  Plan  Atrial fibrillation-paroxysmal  Sinus bradycardia  Ischemic cardiac myopathy  IVCD  Congestive heart failure-chronic-systolic  Defibrillator-Medtronic-VVI   He is struggling with heart failure  notwithstanding maintenance of sinus rhythm. He is been seeing Dr. Reine Just last week with a decrease in his study drug/beta blocker. He has added Aldactone.  His IVCD is not of a pattern or above breath that would suggest benefit from resynchronization  Given his lack of response, I spoke with Dr. Reine Just. Burnis Medin add metolazone 2.5 mg to take today and tomorrow. He will take extrra potassium 40 mEq.   If his nocturnal dyspnea is no better, we will plan to have him admitted under the heart failure service

## 2015-07-19 NOTE — Patient Instructions (Signed)
Medication Instructions: - Your physician has recommended you make the following change in your medication:  1) Start metolazone 2.5 mg take one tablet when you get home with your prescription, if you feel better tomorrow, then take another 2.5 mg dose tomorrow. 2) Take potassium 40 meq with each dose of metolazone  ** [name]- will call you in the morning to see how you are doing.  Labwork: - none  Procedures/Testing: - none  Follow-Up: - Your physician wants you to follow-up in: 1 year with Dr. [name]. You will receive a reminder letter in the mail two months in advance. If you don't receive a letter, please call our office to schedule the follow-up appointment.   Any Additional Special Instructions Will Be Listed Below (If Applicable).     If you need a refill on your cardiac medications before your next appointment, please call your pharmacy.

## 2015-07-19 NOTE — Research (Signed)
Mr. [name] is enrolled in our Genetic AF Research Study. He was in for an encounter with the CHF clinic on July 15, 2015.  C/O fatigue, tiredness.  Study drug was decreased per physician recommendation.  Now taking either Toprol XL 50mg  vs bucindolol 12.5 mg.

## 2015-07-19 NOTE — Telephone Encounter (Signed)
Notes Recorded by Harvie Junior, CMA on 07/19/2015 at 4:56 PM Pt aware of lab results and medication changes. Notes Recorded by Jolaine Artist, MD on 07/19/2015 at 3:47 PM Give extra kcl 40 Notes Recorded by Scarlette Calico, RN on 07/15/2015 at 4:07 PM Labs reviewed by RN, will forward to MD for review

## 2015-07-20 ENCOUNTER — Ambulatory Visit (HOSPITAL_COMMUNITY): Payer: Medicare Other

## 2015-07-20 ENCOUNTER — Telehealth: Payer: Self-pay

## 2015-07-20 NOTE — Telephone Encounter (Signed)
Spoke with patient for follow up after office visit with Dr [name] 07/19/2015.  He reported he took Metolazone 2.5 mg yesterday as ordered.  He is feeling better today and was able to sleep lying flat last night.  He lost 5 lbs of fluid overnight.   His breathing is better today.  Weight is 135 lbs.  Advised to follow the instructions Dr [name] provided yesterday which was to take 1 tablet Metolazone 2.5 mg today with the additional 40 mEq Potassium.   He has already taken his prescribed Furosemide dose for this morning.   Advised to take Metolazone 30 minutes prior to his afternoon dose Furosemide along with the 40 mEq of Potassium.  He and his wife verbalized understanding.  They also said Dr [name] wanted the appointment with Dr [name] to be on Monday, 07/25/2015 instead of 07/27/2015.  Advised either myself or Dr Olin Pia nurse, Nira Conn will contact HF clinic for Monday appointment.   Patient reported having more knee pain today and said he spoke with Dr [name] about it yesterday.  Dr [name] stated the knee pain is a separate issue and not related to the HF.  He asked if he should call another physician regarding the knee pain.  He has seen a Rheumatologist in the past.  Advised will update Dr [name] today in the office regarding effectiveness of the Metolazone and he had no difficulty breathing while sleeping last night and he is feeling better.   I stated I would call back with any additional recommendations and information about the HF appointment for Monday.

## 2015-07-20 NOTE — Telephone Encounter (Signed)
Provided update to Dr [name] that patient was feeling better today and SOB has resolved.  He requested patient be seen at HF clinic on Monday, 07/25/2015.  HF clinc appointment scheduled for 07/25/2015 at 2:40 pm.  Dr [name] suggested patient see PCP for knee pain which is not related to current HF symptoms.    Call to patient and advised HF clinic appointment is scheduled for 6/26/2017at 2:40 pm.  Suggested he schedule an appointment with PCP regarding his knee or the physician he has seen before about the problem.  Scheduled next ICM changed to 08/11/2015.  Advised to call if fluid symptoms return.

## 2015-07-21 ENCOUNTER — Telehealth: Payer: Self-pay

## 2015-07-21 DIAGNOSIS — R0602 Shortness of breath: Secondary | ICD-10-CM | POA: Diagnosis not present

## 2015-07-21 DIAGNOSIS — Z79899 Other long term (current) drug therapy: Secondary | ICD-10-CM | POA: Diagnosis not present

## 2015-07-21 DIAGNOSIS — M25561 Pain in right knee: Secondary | ICD-10-CM | POA: Diagnosis not present

## 2015-07-21 DIAGNOSIS — M25461 Effusion, right knee: Secondary | ICD-10-CM | POA: Diagnosis not present

## 2015-07-21 DIAGNOSIS — J189 Pneumonia, unspecified organism: Secondary | ICD-10-CM | POA: Diagnosis not present

## 2015-07-21 DIAGNOSIS — I480 Paroxysmal atrial fibrillation: Secondary | ICD-10-CM | POA: Diagnosis not present

## 2015-07-21 DIAGNOSIS — I509 Heart failure, unspecified: Secondary | ICD-10-CM | POA: Diagnosis not present

## 2015-07-21 DIAGNOSIS — M109 Gout, unspecified: Secondary | ICD-10-CM | POA: Diagnosis not present

## 2015-07-21 DIAGNOSIS — I255 Ischemic cardiomyopathy: Secondary | ICD-10-CM | POA: Diagnosis not present

## 2015-07-21 DIAGNOSIS — I5023 Acute on chronic systolic (congestive) heart failure: Secondary | ICD-10-CM | POA: Diagnosis not present

## 2015-07-21 DIAGNOSIS — I2510 Atherosclerotic heart disease of native coronary artery without angina pectoris: Secondary | ICD-10-CM | POA: Diagnosis not present

## 2015-07-21 DIAGNOSIS — K589 Irritable bowel syndrome without diarrhea: Secondary | ICD-10-CM | POA: Diagnosis not present

## 2015-07-21 NOTE — Telephone Encounter (Signed)
Received call from patient.  He reported he went to the Rheumatologist regarding knee pain and swelling.  Rheumatologist drained fluid from the knee, gave cortisone injection today and ordered 60 mg of Prednisone and then tapering dosage every 2 days.  Patient is concerned this may effect the Afib or cause some problems with his heart or heart meds.  Advised Dr [name] is not in the office today but will check with Dr [name] regarding his question.  Will call back with recommendation.

## 2015-07-21 NOTE — Telephone Encounter (Signed)
Returned call to patient.  Advised I discussed with Dr Lovena Le regarding him taking Prednisone and Dr Lovena Le said it would be fine and to follow his physicians order for taking the medication.  Advised to eat low salt diet while taking Prednisone.  He verbalized understanding.  He has appointment with Dr [name] on 07/25/2015.

## 2015-07-22 ENCOUNTER — Telehealth: Payer: Self-pay | Admitting: Cardiology

## 2015-07-22 ENCOUNTER — Other Ambulatory Visit (HOSPITAL_COMMUNITY): Payer: Self-pay | Admitting: Internal Medicine

## 2015-07-22 DIAGNOSIS — M1120 Other chondrocalcinosis, unspecified site: Secondary | ICD-10-CM | POA: Diagnosis not present

## 2015-07-22 DIAGNOSIS — I10 Essential (primary) hypertension: Secondary | ICD-10-CM | POA: Diagnosis not present

## 2015-07-22 DIAGNOSIS — E782 Mixed hyperlipidemia: Secondary | ICD-10-CM | POA: Diagnosis not present

## 2015-07-22 DIAGNOSIS — F419 Anxiety disorder, unspecified: Secondary | ICD-10-CM | POA: Diagnosis not present

## 2015-07-22 DIAGNOSIS — I5023 Acute on chronic systolic (congestive) heart failure: Secondary | ICD-10-CM | POA: Diagnosis not present

## 2015-07-22 DIAGNOSIS — I480 Paroxysmal atrial fibrillation: Secondary | ICD-10-CM | POA: Diagnosis not present

## 2015-07-22 NOTE — Telephone Encounter (Signed)
Faxed signed orders regarding PT/INR and warfarin dosing to [facility]

## 2015-07-25 ENCOUNTER — Ambulatory Visit (HOSPITAL_COMMUNITY)
Admission: RE | Admit: 2015-07-25 | Discharge: 2015-07-25 | Disposition: A | Discharge status: Home / Self Care | Payer: Medicare Other | Source: Ambulatory Visit | Attending: Internal Medicine | Admitting: Internal Medicine

## 2015-07-25 ENCOUNTER — Encounter (HOSPITAL_COMMUNITY): Payer: Self-pay | Admitting: Internal Medicine

## 2015-07-25 VITALS — BP 92/70 | HR 66 | Ht 67.0 in | Wt 140.0 lb

## 2015-07-25 DIAGNOSIS — Z800 Family history of malignant neoplasm of digestive organs: Secondary | ICD-10-CM | POA: Diagnosis not present

## 2015-07-25 DIAGNOSIS — Z9889 Other specified postprocedural states: Secondary | ICD-10-CM | POA: Diagnosis not present

## 2015-07-25 DIAGNOSIS — I252 Old myocardial infarction: Secondary | ICD-10-CM | POA: Insufficient documentation

## 2015-07-25 DIAGNOSIS — I5042 Chronic combined systolic (congestive) and diastolic (congestive) heart failure: Secondary | ICD-10-CM | POA: Diagnosis not present

## 2015-07-25 DIAGNOSIS — K219 Gastro-esophageal reflux disease without esophagitis: Secondary | ICD-10-CM | POA: Diagnosis not present

## 2015-07-25 DIAGNOSIS — I472 Ventricular tachycardia, unspecified: Secondary | ICD-10-CM

## 2015-07-25 DIAGNOSIS — M25461 Effusion, right knee: Secondary | ICD-10-CM | POA: Diagnosis not present

## 2015-07-25 DIAGNOSIS — I447 Left bundle-branch block, unspecified: Secondary | ICD-10-CM | POA: Insufficient documentation

## 2015-07-25 DIAGNOSIS — Z7982 Long term (current) use of aspirin: Secondary | ICD-10-CM | POA: Diagnosis not present

## 2015-07-25 DIAGNOSIS — Z95810 Presence of automatic (implantable) cardiac defibrillator: Secondary | ICD-10-CM | POA: Diagnosis not present

## 2015-07-25 DIAGNOSIS — I5022 Chronic systolic (congestive) heart failure: Secondary | ICD-10-CM | POA: Diagnosis not present

## 2015-07-25 DIAGNOSIS — R42 Dizziness and giddiness: Secondary | ICD-10-CM | POA: Insufficient documentation

## 2015-07-25 DIAGNOSIS — I2510 Atherosclerotic heart disease of native coronary artery without angina pectoris: Secondary | ICD-10-CM | POA: Insufficient documentation

## 2015-07-25 DIAGNOSIS — Z823 Family history of stroke: Secondary | ICD-10-CM | POA: Diagnosis not present

## 2015-07-25 DIAGNOSIS — Z8249 Family history of ischemic heart disease and other diseases of the circulatory system: Secondary | ICD-10-CM | POA: Diagnosis not present

## 2015-07-25 DIAGNOSIS — Z7901 Long term (current) use of anticoagulants: Secondary | ICD-10-CM | POA: Insufficient documentation

## 2015-07-25 DIAGNOSIS — Z7902 Long term (current) use of antithrombotics/antiplatelets: Secondary | ICD-10-CM | POA: Insufficient documentation

## 2015-07-25 DIAGNOSIS — Z8619 Personal history of other infectious and parasitic diseases: Secondary | ICD-10-CM | POA: Insufficient documentation

## 2015-07-25 DIAGNOSIS — I4729 Other ventricular tachycardia: Secondary | ICD-10-CM

## 2015-07-25 DIAGNOSIS — I480 Paroxysmal atrial fibrillation: Secondary | ICD-10-CM | POA: Diagnosis not present

## 2015-07-25 DIAGNOSIS — I255 Ischemic cardiomyopathy: Secondary | ICD-10-CM

## 2015-07-25 DIAGNOSIS — Z880 Allergy status to penicillin: Secondary | ICD-10-CM | POA: Insufficient documentation

## 2015-07-25 DIAGNOSIS — Z79899 Other long term (current) drug therapy: Secondary | ICD-10-CM

## 2015-07-25 DIAGNOSIS — Z951 Presence of aortocoronary bypass graft: Secondary | ICD-10-CM | POA: Diagnosis not present

## 2015-07-25 LAB — BASIC METABOLIC PANEL WITH GFR
Anion gap: 10 (ref 5–15)
BUN: 24 mg/dL — ABNORMAL HIGH (ref 6–20)
CO2: 31 mmol/L (ref 22–32)
Calcium: 9.5 mg/dL (ref 8.9–10.3)
Chloride: 92 mmol/L — ABNORMAL LOW (ref 101–111)
Creatinine, Ser: 1.33 mg/dL — ABNORMAL HIGH (ref 0.61–1.24)
GFR calc Af Amer: 59 mL/min — ABNORMAL LOW
GFR calc non Af Amer: 51 mL/min — ABNORMAL LOW
Glucose, Bld: 103 mg/dL — ABNORMAL HIGH (ref 65–99)
Potassium: 4.5 mmol/L (ref 3.5–5.1)
Sodium: 133 mmol/L — ABNORMAL LOW (ref 135–145)

## 2015-07-25 LAB — BRAIN NATRIURETIC PEPTIDE: B Natriuretic Peptide: 895.5 pg/mL — ABNORMAL HIGH (ref 0.0–100.0)

## 2015-07-25 MED ORDER — LOSARTAN POTASSIUM 25 MG PO TABS: 12.5000 mg | ORAL_TABLET | Freq: Every day | ORAL | Status: DC

## 2015-07-25 MED ORDER — NITROGLYCERIN 0.4 MG SL SUBL
0.4000 mg | SUBLINGUAL_TABLET | SUBLINGUAL | Status: DC | PRN
Start: 2015-07-25 — End: 2015-07-25

## 2015-07-25 MED ORDER — METOLAZONE 2.5 MG PO TABS: 2.5000 mg | ORAL_TABLET | Freq: Every day | ORAL | Status: DC | PRN

## 2015-07-25 MED ORDER — TORSEMIDE 20 MG PO TABS: 20.0000 mg | ORAL_TABLET | Freq: Two times a day (BID) | ORAL | Status: DC

## 2015-07-25 MED ORDER — NITROGLYCERIN 0.4 MG SL SUBL: 0.4000 mg | SUBLINGUAL_TABLET | SUBLINGUAL | Status: DC | PRN

## 2015-07-25 MED FILL — LOSARTAN POTASSIUM 25 MG TA: 25 | 90 days supply | Qty: 45 | Fill #0

## 2015-07-25 MED FILL — TORSEMIDE 20 MG TABLET: 20 | 30 days supply | Qty: 60 | Fill #0

## 2015-07-25 MED FILL — metOLazone 2.5 MG TABS: 2.5 | 2 days supply | Qty: 2 | Fill #0

## 2015-07-25 NOTE — Patient Instructions (Signed)
STOP Lasix.  START Losartan 12.5 mg (1/2 tab) every evening.  START Demadex 20 mg tablet twice daily.  Routine lab work today. Will notify you of abnormal results, otherwise no news is good news!  Return next MOnday for repeat lab.  If weight 135 lbs or more, take Metolazone 2.5 mg tablet with 40 meq EXTRA potassium (in addition to normal daily dose of potassium).  Follow up 2 weeks.  Do the following things EVERYDAY: 1) Weigh yourself in the morning before breakfast. Write it down and keep it in a log. 2) Take your medicines as prescribed 3) Eat low salt foods-Limit salt (sodium) to 2000 mg per day.  4) Stay as active as you can everyday 5) Limit all fluids for the day to less than 2 liters

## 2015-07-25 NOTE — Progress Notes (Signed)
Patient ID: [name], male   DOB: 01-Dec-1940, 75 y.o.   MRN: [id number]  [facility]  Date:  07/25/2015   ID:  [name], [name] 05-25-1940, MRN [id number]  PCP:  [name], MD  Primary Cardiologist:   Tresa Endo Referring: Dr. [name] Kill   History of Present Illness: [name] is a 75 y.o. 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 In September 1998 he underwent CABG revascularization surgery after an unsuccessful attempt at stenting of his proximal LAD by Dr. Juanda Chance. Ejection fraction was 25-30%. In 2002 he underwent initial ICD implantation for nonsustained ventricular tachycardia documented on event monitor for primary prevention. In January 2010 he underwent generator change with a Medtronic Virtuoso single chamber cardioverter defibrillator. Additional problems include paroxysmal atrial fibrillation, occasional PVCs and an attempt was made in the past to overdrive suppress his PVCs with reducing his beta blocker therapy. He felt he had PVCs on the reduced dose and therefore has been on the higher dose.   A nuclear perfusion study in November 2013 showed a large area of scar in the LAD territory (extent 44%) involving the mid to apical anterior, apical and infero-apical to mid infero-septal and apical lateral wall without associated ischemia.  An echo Doppler study on 01/01/2013 revealed a mildly dilated LV. Ejection fraction was 25-30%. There was dyskinesis in scarring of the apical myocardium as well as akinesis of the mid anteroseptal, anterior and anterolateral walls consistent with his prior LAD infarction. He does have a history of paroxysmal atrial fibrillation.    He is on Coumadin anticoagulation for PAF. He was readmitted to the hospital from a May 2 through 06/04/2014 with recurrent atrial fibrillation and started on Tikosyn. He has been enrolled in the genetic AF trial and is  now off Tikosyn and on the study drug of either Bucindolol or metoprolol high dose.  He has been discussing CRT upgrade with Dr. Graciela Husbands and has intermediate predictors of benefit (LBBB with QRS almost 150 ms, but ischemic cardiomyopathy and male gender).    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 Lisinopril 2.5 recently titrated down from 20 and now stopped due to hypotension.   He returns for f/u. Saw Dr. Graciela Husbands last week and was feeling bad. Still volume overloaded. We discussed admission for HF versus adding metolazone for 2 days. He chose the latter and lost 7 pounds. B-blocker now dropped down to either Toprol 50 or bisoprolol 12.5. Feels dizzy at times. No orthopnea or PND. Weight 131-132. Appetite improving. Still on prednisone for right knee effusion. Now on 20 mg and will drop to 15 tomorrow.   Echo 5/17 EF 20% RV mildly down.    Wt Readings from Last 3 Encounters:  07/25/15 140 lb (63.504 kg)  07/19/15 143 lb 3.2 oz (64.955 kg)  07/15/15 144 lb (65.318 kg)     Past Medical History  Diagnosis Date  . Coronary artery disease     Hx MI 1992, CABG 1998 , Nuc study 11.2013 large scar but no ischemia  . 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 Paroxysmal atrial fibrillation (HCC)   . GERD (gastroesophageal reflux disease)   . H. pylori infection     Hx of   . Cardiomyopathy, ischemic 2011    with EF 25-35% by echo  .  H/O myocardial infarction, greater than 8 weeks 1992    large ant wall injury  . NSVT (nonsustained ventricular tachycardia) (HCC)   . Acute on chronic systolic CHF (congestive heart failure) ([facility]) 06/09/2015    Current Outpatient Prescriptions  Medication Sig Dispense Refill  . acetaminophen (TYLENOL) 650 MG CR tablet Take 650 mg by mouth 2 (two) times daily.     . AMBULATORY NON FORMULARY MEDICATION Take 1 tablet by mouth 2 (two) times daily. Medication  Name: Genetic AF Research Study, drug provided.  Toprol XL 50mg  vs bucindolol 12.5 mg.    . aspirin 81 MG chewable tablet Chew 1 tablet (81 mg total) by mouth on Monday, Wednesday, & Friday. 12 tablet 11  . cetirizine (ZYRTEC) 10 MG tablet Take 10 mg by mouth daily.      . Cholecalciferol (VITAMIN D) 2000 UNITS CAPS Take 1 capsule by mouth daily.    Marland Kitchen dicyclomine (BENTYL) 10 MG capsule Take 10 mg by mouth 2 (two) times daily.    Marland Kitchen esomeprazole (NEXIUM) 40 MG capsule Take 1 capsule (40 mg total) by mouth daily at 12 noon. 30 capsule 3  . furosemide (LASIX) 40 MG tablet Take 1 tablet (40 mg total) by mouth 2 (two) times daily. 180 tablet 3  . glycopyrrolate (ROBINUL) 2 MG tablet Take 2 mg by mouth 2 (two) times daily as needed (stomach).    . isosorbide mononitrate (IMDUR) 30 MG 24 hr tablet Take 15 mg by mouth daily.    Marland Kitchen LORazepam (ATIVAN) 1 MG tablet Take 0.5 mg by mouth at bedtime.    . magnesium oxide (MAG-OX) 400 MG tablet Take 400 mg by mouth daily.    . mometasone (ASMANEX) 220 MCG/INH inhaler Inhale 1 puff into the lungs daily.     . montelukast (SINGULAIR) 10 MG tablet Take 1 tablet (10 mg total) by mouth at bedtime. PLEASE SCHEDULE APPOINTMENT. 30 tablet 0  . nitroGLYCERIN (NITROSTAT) 0.4 MG SL tablet Place 1 tablet (0.4 mg total) under the tongue every 5 (five) minutes as needed for chest pain. 25 tablet 1  . omeprazole (PRILOSEC) 20 MG capsule Take 1 capsule (20 mg total) by mouth 2 (two) times daily before a meal. 180 capsule 3  . potassium chloride SA (KLOR-CON M20) 20 MEQ tablet Take 1 tablet (20 mEq total) by mouth daily. 90 tablet 3  . silodosin (RAPAFLO) 4 MG CAPS capsule Take 8 mg by mouth See admin instructions. Every 2 days    . simvastatin (ZOCOR) 40 MG tablet TAKE ONE TABLET BY MOUTH AT BEDTIME 90 tablet 3  . spironolactone (ALDACTONE) 25 MG tablet Take 1 tablet (25 mg total) by mouth daily. 30 tablet 11  . warfarin (COUMADIN) 5 MG tablet Take 1 tablet by mouth daily or as  directed by coumadin clinic (Patient taking differently: Take 2.5-5 mg by mouth daily. Take 2.5mg  on Monday, Wednesday and Friday. All other days take 5mg ) 90 tablet 0  . predniSONE (DELTASONE) 10 MG tablet      No current facility-administered medications for this encounter.    Allergies:    Allergies  Allergen Reactions  . Amoxicillin Rash  . Penicillins Rash    Social History:  The patient  reports that he has never smoked. He has never used smokeless tobacco. He reports that he drinks about 1.2 oz of alcohol per week. He reports that he does not use illicit drugs.   Family history:   Family History  Problem Relation Age of Onset  .  Colon cancer Neg Hx   . Heart disease Father     questionable  . Stroke Mother   . Heart failure Sister   . Healthy Brother   . Healthy Sister   . Parkinsonism Brother     ROS:  Please see the history of present illness.  All other systems reviewed and negative.   PHYSICAL EXAM: VS:  BP 92/70 mmHg  Pulse 66  Ht 5\' 7"  (1.702 m)  Wt 140 lb (63.504 kg)  BMI 21.92 kg/m2  SpO2 97% Well nourished, well developed, in no acute distress HEENT: Pupils are equal round react to light accommodation extraocular movements are intact.  Neck: JVP 9 prominent CV wavesNo cervical lymphadenopathy. Cardiac: Irregular rate and rhythm + s3 Lungs:  clear to auscultation bilaterally, no wheezing, rhonchi or rales Abd: soft, nontender, mildly distended positive bowel sounds all quadrants, no hepatosplenomegaly Ext: no lower extremity edema.  2+ radial and dorsalis pedis pulses. Skin: warm and dry Neuro:  Grossly normal   ASSESSMENT AND PLAN: 1. Acute on chronic systolic HF due to iCM, EF 20-25% 2. PAF --In Genetic AF trial --AC with warfarin 3. CAD s/p previous anterior infarct 4. LBBB  He is improved with diuresis but still NYHA III. Given that he required metolazone to diurese I think it is best to switch lasix to demadex 20 bid. Also start losartan  12,5 qhs as BP tolerates. Check labs today and in 1-2 weeks. Can use prn metolazone.  May need work-up for advanced therapies soon.   ICD interrogated personally in clinic. Optivol now down. No VT/AF. Activity level < 1 hour.    Bayron Dalto,MD 2:56 PM

## 2015-07-26 ENCOUNTER — Encounter: Payer: Self-pay | Admitting: Internal Medicine

## 2015-07-26 ENCOUNTER — Ambulatory Visit (INDEPENDENT_AMBULATORY_CARE_PROVIDER_SITE_OTHER): Payer: Medicare Other | Admitting: Pharmacist Clinician (PhC)/ Clinical Pharmacy Specialist

## 2015-07-26 ENCOUNTER — Encounter (HOSPITAL_COMMUNITY): Payer: Self-pay

## 2015-07-26 DIAGNOSIS — J189 Pneumonia, unspecified organism: Secondary | ICD-10-CM | POA: Diagnosis not present

## 2015-07-26 DIAGNOSIS — K589 Irritable bowel syndrome without diarrhea: Secondary | ICD-10-CM | POA: Diagnosis not present

## 2015-07-26 DIAGNOSIS — I5023 Acute on chronic systolic (congestive) heart failure: Secondary | ICD-10-CM | POA: Diagnosis not present

## 2015-07-26 DIAGNOSIS — I480 Paroxysmal atrial fibrillation: Secondary | ICD-10-CM | POA: Diagnosis not present

## 2015-07-26 DIAGNOSIS — I2510 Atherosclerotic heart disease of native coronary artery without angina pectoris: Secondary | ICD-10-CM | POA: Diagnosis not present

## 2015-07-26 DIAGNOSIS — Z7901 Long term (current) use of anticoagulants: Secondary | ICD-10-CM

## 2015-07-26 DIAGNOSIS — I255 Ischemic cardiomyopathy: Secondary | ICD-10-CM | POA: Diagnosis not present

## 2015-07-26 LAB — POCT INR: INR: 4.1

## 2015-07-27 ENCOUNTER — Telehealth (HOSPITAL_COMMUNITY): Payer: Self-pay | Admitting: *Deleted

## 2015-07-27 NOTE — Telephone Encounter (Signed)
Pt called c/o low blood pressure since starting losartan 12.mg in the pm and Torsemide 20mg  bid on 6/26. Pts bp was 92/70 on 6/26 today it was 84/47. Pt was not dizzy but did say he felt a little weak. Spoke with patients wife who stated patient accidentally took 25mg  of losartan last night.  Pt instructed to hold Losartan tonight and restart prescribed dose of 12.5mg  tomorrow night. Patient scheduled for bmet on Monday.

## 2015-07-28 ENCOUNTER — Encounter (HOSPITAL_COMMUNITY): Payer: Medicare Other | Admitting: Internal Medicine

## 2015-07-31 ENCOUNTER — Telehealth: Payer: Self-pay | Admitting: Cardiology

## 2015-07-31 NOTE — Telephone Encounter (Signed)
Pt called with BP of 88/46 he was feeling sluggish, lethargic.  Recent addition of losartan.  He will hold torsemide and losartan tonight , keep a record of BP reading and call HF clinic in AM.  Discussed with Dr. [name].

## 2015-08-01 ENCOUNTER — Ambulatory Visit (HOSPITAL_COMMUNITY)
Admission: RE | Admit: 2015-08-01 | Discharge: 2015-08-01 | Disposition: A | Discharge status: Home / Self Care | Payer: Medicare Other | Source: Ambulatory Visit | Attending: Internal Medicine | Admitting: Internal Medicine

## 2015-08-01 ENCOUNTER — Ambulatory Visit (INDEPENDENT_AMBULATORY_CARE_PROVIDER_SITE_OTHER): Payer: Medicare Other | Admitting: Pharmacist

## 2015-08-01 DIAGNOSIS — I5023 Acute on chronic systolic (congestive) heart failure: Secondary | ICD-10-CM

## 2015-08-01 DIAGNOSIS — I5042 Chronic combined systolic (congestive) and diastolic (congestive) heart failure: Secondary | ICD-10-CM | POA: Insufficient documentation

## 2015-08-01 DIAGNOSIS — Z7901 Long term (current) use of anticoagulants: Secondary | ICD-10-CM

## 2015-08-01 DIAGNOSIS — I2510 Atherosclerotic heart disease of native coronary artery without angina pectoris: Secondary | ICD-10-CM | POA: Diagnosis not present

## 2015-08-01 DIAGNOSIS — I480 Paroxysmal atrial fibrillation: Secondary | ICD-10-CM | POA: Diagnosis not present

## 2015-08-01 DIAGNOSIS — I255 Ischemic cardiomyopathy: Secondary | ICD-10-CM | POA: Diagnosis not present

## 2015-08-01 DIAGNOSIS — J189 Pneumonia, unspecified organism: Secondary | ICD-10-CM | POA: Diagnosis not present

## 2015-08-01 DIAGNOSIS — K589 Irritable bowel syndrome without diarrhea: Secondary | ICD-10-CM | POA: Diagnosis not present

## 2015-08-01 LAB — BASIC METABOLIC PANEL
Anion gap: 6 (ref 5–15)
BUN: 20 mg/dL (ref 6–20)
CO2: 31 mmol/L (ref 22–32)
Calcium: 9.5 mg/dL (ref 8.9–10.3)
Chloride: 95 mmol/L — ABNORMAL LOW (ref 101–111)
Creatinine, Ser: 0.97 mg/dL (ref 0.61–1.24)
GFR calc Af Amer: >60 mL/min (ref >60)
GLUCOSE: 71 mg/dL (ref 65–99)
POTASSIUM: 3.8 mmol/L (ref 3.5–5.1)
Sodium: 132 mmol/L — ABNORMAL LOW (ref 135–145)

## 2015-08-01 LAB — POCT INR: INR: 1.8

## 2015-08-01 NOTE — Progress Notes (Signed)
During pts lab appt, pt reported he was advised over the weekend to hold losartan and torsemide reports his b/p was low 96/59 During visit b/p 98/68  Advised once we received lab results provider could further adjust medication if needed

## 2015-08-01 NOTE — Telephone Encounter (Signed)
Dr [name] reviewed info and pt labs from today.  He recommends pt restart med and continue to monitor BP and weight.  Spoke w/pt, he states he is feeling better, wt is stable, he will resume meds and continue to monitor.

## 2015-08-03 ENCOUNTER — Telehealth: Payer: Self-pay | Admitting: Cardiovascular Disease

## 2015-08-03 ENCOUNTER — Encounter: Payer: Self-pay | Admitting: *Deleted

## 2015-08-03 DIAGNOSIS — Z006 Encounter for examination for normal comparison and control in clinical research program: Secondary | ICD-10-CM

## 2015-08-03 NOTE — Telephone Encounter (Signed)
Returned call. Patient reporting A Fib symptoms last night and this AM. He notes HR between 89-100.  BP taken this AM 93/61 w HR 95  He reports no SOB, no syncope, lightheadness, incr fatigue or chest discomfort.  Note patient sees Dr. [name] for HF management, and sees A Fib clinic. Currently enrolled in a drug study. Pt notes his BB was recently changed in context of HF. I called A Fib clinic to seek advice. Rushie Goltz RN advised to have patient call Camila Li, RN who manages patients for study drug so that he can advise patient on protocol and recommendations.  Communicated this to patient. He voiced understanding and agreement w/ instructions.

## 2015-08-03 NOTE — Progress Notes (Signed)
Spoke with [name] this morning.  He has been in AFib since last night. At 915 had BP 94/53 and HR 99.  Spoke with HF clinic and AFib clinic.  Both suggestions were his rate was controlled and he has an appointment with HF next week.  I called him at 1215 to see if he felt he could wait until next week for his appointment or if he felt he needed to go to the ER.  BP 98/63 HR 113.  I relayed the information provided by the HF and AFib clinic.  At this time he and his wife said he was resting comfortably at home and did not need to come to the hospital.

## 2015-08-03 NOTE — Telephone Encounter (Signed)
New message   Pt wanting rn to give him a call

## 2015-08-04 ENCOUNTER — Encounter: Payer: Self-pay | Admitting: *Deleted

## 2015-08-04 DIAGNOSIS — Z006 Encounter for examination for normal comparison and control in clinical research program: Secondary | ICD-10-CM

## 2015-08-04 NOTE — Progress Notes (Signed)
Spoke with [name] this morning.  BP prior to call 98/58, HR 94.  BP during call 100/61 HR 100.  Pt verbalized understanding to call HF clinic if he needs an appointment earlier than next week.  Also, to go to ER if feels need for immediate attention.

## 2015-08-06 ENCOUNTER — Telehealth: Payer: Self-pay | Admitting: Internal Medicine

## 2015-08-06 NOTE — Telephone Encounter (Signed)
Patient with IBS and GERD. Called complaining of upset stomach after ice cream. No distress and not severe. Had questions about hid GI meds and diet. Advised to try bentyl and if needed, his robinul. He wanted to take a Nexium. I said that would be fine. Follow up with [facility] as needed

## 2015-08-08 ENCOUNTER — Telehealth (HOSPITAL_COMMUNITY): Payer: Self-pay

## 2015-08-08 ENCOUNTER — Ambulatory Visit (INDEPENDENT_AMBULATORY_CARE_PROVIDER_SITE_OTHER): Payer: Medicare Other | Admitting: Pharmacist

## 2015-08-08 DIAGNOSIS — I480 Paroxysmal atrial fibrillation: Secondary | ICD-10-CM

## 2015-08-08 DIAGNOSIS — I5023 Acute on chronic systolic (congestive) heart failure: Secondary | ICD-10-CM | POA: Diagnosis not present

## 2015-08-08 DIAGNOSIS — J189 Pneumonia, unspecified organism: Secondary | ICD-10-CM | POA: Diagnosis not present

## 2015-08-08 DIAGNOSIS — I255 Ischemic cardiomyopathy: Secondary | ICD-10-CM | POA: Diagnosis not present

## 2015-08-08 DIAGNOSIS — Z7901 Long term (current) use of anticoagulants: Secondary | ICD-10-CM

## 2015-08-08 DIAGNOSIS — K589 Irritable bowel syndrome without diarrhea: Secondary | ICD-10-CM | POA: Diagnosis not present

## 2015-08-08 DIAGNOSIS — I2510 Atherosclerotic heart disease of native coronary artery without angina pectoris: Secondary | ICD-10-CM | POA: Diagnosis not present

## 2015-08-08 LAB — POCT INR: INR: 2.5

## 2015-08-08 NOTE — Telephone Encounter (Signed)
Patient reports that he is feeling "somewhat better".  He has not tried the dicyclomine or the robinul.  He has been taking nexium PRN,.  I reviewed with him that this is not an PRN drug and for its maximum benefit he should take it on a daily basis.  He will try the antispasmodics when he has his IBS sysmptoms.

## 2015-08-08 NOTE — Telephone Encounter (Signed)
Would someone mind giving this patient a call to see if he is feeling better, he called in over the weekend. I agree with regimen as outlined per Dr. Henrene Pastor. Thanks

## 2015-08-08 NOTE — Telephone Encounter (Signed)
Kaitlyn RN [facility] calling CHF clinic triage line to recert patient for once weekly visits with PRN visits. Also wondered if patient should have parameters on medications as he "obsessively" checks his BP before and after he takes meds per RN report and reports feeling dizzy off and on the rest of the day after taking his morning meds while periodically checking BP. BP runs in 90's over 50s. Patient has apt Wednesday, will add ntoe to apt reimnder to address the BP and see if parameters are necessary. Attempted to call patient to update him on this conversation with [facility] RN, no answer.  Renee Pain

## 2015-08-08 NOTE — Telephone Encounter (Signed)
Patient reports that he

## 2015-08-10 ENCOUNTER — Telehealth (HOSPITAL_COMMUNITY): Payer: Self-pay | Admitting: Cardiology

## 2015-08-10 ENCOUNTER — Ambulatory Visit (HOSPITAL_COMMUNITY)
Admission: RE | Admit: 2015-08-10 | Discharge: 2015-08-10 | Disposition: A | Discharge status: Home / Self Care | Payer: Medicare Other | Source: Ambulatory Visit | Attending: Cardiology | Admitting: Cardiology

## 2015-08-10 VITALS — BP 94/52 | HR 65 | Wt 134.8 lb

## 2015-08-10 DIAGNOSIS — I447 Left bundle-branch block, unspecified: Secondary | ICD-10-CM | POA: Insufficient documentation

## 2015-08-10 DIAGNOSIS — I255 Ischemic cardiomyopathy: Secondary | ICD-10-CM | POA: Diagnosis not present

## 2015-08-10 DIAGNOSIS — Z7901 Long term (current) use of anticoagulants: Secondary | ICD-10-CM | POA: Diagnosis not present

## 2015-08-10 DIAGNOSIS — I5042 Chronic combined systolic (congestive) and diastolic (congestive) heart failure: Secondary | ICD-10-CM

## 2015-08-10 DIAGNOSIS — R42 Dizziness and giddiness: Secondary | ICD-10-CM | POA: Diagnosis not present

## 2015-08-10 DIAGNOSIS — I2583 Coronary atherosclerosis due to lipid rich plaque: Secondary | ICD-10-CM

## 2015-08-10 DIAGNOSIS — Z7982 Long term (current) use of aspirin: Secondary | ICD-10-CM | POA: Diagnosis not present

## 2015-08-10 DIAGNOSIS — I480 Paroxysmal atrial fibrillation: Secondary | ICD-10-CM | POA: Diagnosis not present

## 2015-08-10 DIAGNOSIS — Z880 Allergy status to penicillin: Secondary | ICD-10-CM | POA: Insufficient documentation

## 2015-08-10 DIAGNOSIS — I2510 Atherosclerotic heart disease of native coronary artery without angina pectoris: Secondary | ICD-10-CM

## 2015-08-10 DIAGNOSIS — I252 Old myocardial infarction: Secondary | ICD-10-CM | POA: Insufficient documentation

## 2015-08-10 DIAGNOSIS — I5022 Chronic systolic (congestive) heart failure: Secondary | ICD-10-CM | POA: Diagnosis not present

## 2015-08-10 DIAGNOSIS — Z95810 Presence of automatic (implantable) cardiac defibrillator: Secondary | ICD-10-CM | POA: Insufficient documentation

## 2015-08-10 DIAGNOSIS — K219 Gastro-esophageal reflux disease without esophagitis: Secondary | ICD-10-CM | POA: Diagnosis not present

## 2015-08-10 DIAGNOSIS — Z951 Presence of aortocoronary bypass graft: Secondary | ICD-10-CM | POA: Insufficient documentation

## 2015-08-10 DIAGNOSIS — I509 Heart failure, unspecified: Secondary | ICD-10-CM

## 2015-08-10 LAB — BASIC METABOLIC PANEL
ANION GAP: 6 (ref 5–15)
BUN: 14 mg/dL (ref 6–20)
CALCIUM: 9.1 mg/dL (ref 8.9–10.3)
CO2: 32 mmol/L (ref 22–32)
CREATININE: 0.92 mg/dL (ref 0.61–1.24)
Chloride: 96 mmol/L — ABNORMAL LOW (ref 101–111)
Glucose, Bld: 75 mg/dL (ref 65–99)
Potassium: 3.5 mmol/L (ref 3.5–5.1)
Sodium: 134 mmol/L — ABNORMAL LOW (ref 135–145)

## 2015-08-10 MED ORDER — POTASSIUM CHLORIDE CRYS ER 20 MEQ PO TBCR: 20.0000 meq | EXTENDED_RELEASE_TABLET | Freq: Two times a day (BID) | ORAL | Status: DC

## 2015-08-10 NOTE — Progress Notes (Signed)
Patient ID: [name], male   DOB: January 31, 1940, 75 y.o.   MRN: [id number]    Advanced Heart Failure Clinic Note   Date:  08/10/2015   ID:  [name], [name] 1940/07/26, MRN [id number]  PCP:  [name], MD  Primary Cardiologist:   [name] Referring: Dr. [name]   History of Present Illness: [name] is a 75 y.o. 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 In September 1998 he underwent CABG revascularization surgery after an unsuccessful attempt at stenting of his proximal LAD by [name]. Ejection fraction was 25-30%. In 2002 he underwent initial ICD implantation for nonsustained ventricular tachycardia documented on event monitor for primary prevention. In January 2010 he underwent generator change with a Medtronic Virtuoso single chamber cardioverter defibrillator. Additional problems include paroxysmal atrial fibrillation, occasional PVCs and an attempt was made in the past to overdrive suppress his PVCs with reducing his beta blocker therapy. He felt he had PVCs on the reduced dose and therefore has been on the higher dose.   A nuclear perfusion study in November 2013 showed a large area of scar in the LAD territory (extent 44%) involving the mid to apical anterior, apical and infero-apical to mid infero-septal and apical lateral wall without associated ischemia.  An echo Doppler study on 01/01/2013 revealed a mildly dilated LV. Ejection fraction was 25-30%. There was dyskinesis in scarring of the apical myocardium as well as akinesis of the mid anteroseptal, anterior and anterolateral walls consistent with his prior LAD infarction. He does have a history of paroxysmal atrial fibrillation.    He is on Coumadin anticoagulation for PAF. He was readmitted to the hospital from a May 2 through 06/04/2014 with recurrent atrial fibrillation and started on Tikosyn. He has been enrolled in the  genetic AF trial and is now off Tikosyn and on the study drug of either Bucindolol or metoprolol high dose.  He has been discussing CRT upgrade with Dr. 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 Lisinopril 2.5 recently titrated down from 20 and now stopped due to hypotension.   He returns for regular follow up. At last visit switched from lasix to demadex. Down 6 lbs from last visit. Has not needed any metolazone.  Now sleeping flat. Is getting around the house better with less SOB.  Watching fluid and salt. Daughter is helping with diet and getting/making low salt foods/alternatives. Isn't sleeping as well, but PCP working on with low dose ativan. Occasional lightheadedness when BP is into 90s  BP running in 90-100s at home. Appetite continues to improve. Weight at home. ~ 128-130 lbs. Is off prednisone now for his knee. Now just taking colchicine. No bleeding on coumadin.  Echo 5/17 EF 20% RV mildly down.   Optivol interrogation: Fluid status remains down since last visit. No VT/VF alerts. No AF alerts. Activity level ~ 1 hour.   Wt Readings from Last 3 Encounters:  08/10/15 134 lb 12.8 oz (61.145 kg)  07/25/15 140 lb (63.504 kg)  07/19/15 143 lb 3.2 oz (64.955 kg)     Past Medical History  Diagnosis Date  . Coronary artery disease     Hx MI 1992, CABG 1998 , Nuc study 11.2013 large scar but no ischemia  .  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 Paroxysmal atrial fibrillation (HCC)   . GERD (gastroesophageal reflux disease)   . H. pylori infection     Hx of   . Cardiomyopathy, ischemic 2011    with EF 25-35% by echo  . H/O myocardial infarction, greater than 8 weeks 1992    large ant wall injury  . NSVT (nonsustained ventricular tachycardia) ([place])    . Acute on chronic systolic CHF (congestive heart failure) ([place]) 06/09/2015    Current Outpatient Prescriptions  Medication Sig Dispense Refill  . acetaminophen (TYLENOL) 650 MG CR tablet Take 650 mg by mouth 2 (two) times daily.     . AMBULATORY NON FORMULARY MEDICATION Take 1 tablet by mouth 2 (two) times daily. Medication Name: Genetic AF Research Study, drug provided.  Toprol XL 50mg  vs bucindolol 12.5 mg.    . aspirin 81 MG chewable tablet Chew 1 tablet (81 mg total) by mouth on Monday, Wednesday, & Friday. 12 tablet 11  . cetirizine (ZYRTEC) 10 MG tablet Take 10 mg by mouth daily.      . Cholecalciferol (VITAMIN D) 2000 UNITS CAPS Take 1 capsule by mouth daily.    Marland Kitchen dicyclomine (BENTYL) 10 MG capsule Take 10 mg by mouth 2 (two) times daily.    Marland Kitchen esomeprazole (NEXIUM) 40 MG capsule Take 1 capsule (40 mg total) by mouth daily at 12 noon. 30 capsule 3  . glycopyrrolate (ROBINUL) 2 MG tablet Take 2 mg by mouth 2 (two) times daily as needed (stomach).    . isosorbide mononitrate (IMDUR) 30 MG 24 hr tablet Take 15 mg by mouth daily.    Marland Kitchen LORazepam (ATIVAN) 1 MG tablet Take 0.5 mg by mouth at bedtime.    Marland Kitchen losartan (COZAAR) 25 MG tablet Take 0.5 tablets (12.5 mg total) by mouth daily. 90 tablet 3  . magnesium oxide (MAG-OX) 400 MG tablet Take 400 mg by mouth daily.    . metolazone (ZAROXOLYN) 2.5 MG tablet Take 1 tablet (2.5 mg total) by mouth daily as needed (if weight 135 lbs or more). Take 40 meq potassium if you take this pill. 2 tablet 0  . mometasone (ASMANEX) 220 MCG/INH inhaler Inhale 1 puff into the lungs daily.     . montelukast (SINGULAIR) 10 MG tablet Take 1 tablet (10 mg total) by mouth at bedtime. PLEASE SCHEDULE APPOINTMENT. 30 tablet 0  . nitroGLYCERIN (NITROSTAT) 0.4 MG SL tablet Place 1 tablet (0.4 mg total) under the tongue every 5 (five) minutes as needed for chest pain. 25 tablet 1  . omeprazole (PRILOSEC) 20 MG capsule Take 1 capsule (20 mg total) by mouth 2 (two) times  daily before a meal. 180 capsule 3  . potassium chloride SA (KLOR-CON M20) 20 MEQ tablet Take 1 tablet (20 mEq total) by mouth daily. 90 tablet 3  . predniSONE (DELTASONE) 10 MG tablet     . silodosin (RAPAFLO) 4 MG CAPS capsule Take 8 mg by mouth See admin instructions. Every 2 days    . simvastatin (ZOCOR) 40 MG tablet TAKE ONE TABLET BY MOUTH AT BEDTIME 90 tablet 3  . spironolactone (ALDACTONE) 25 MG tablet Take 1 tablet (25 mg total) by mouth daily. 30 tablet 11  . torsemide (DEMADEX) 20 MG tablet Take 1 tablet (20 mg total) by mouth 2 (two) times daily. 60 tablet 6  . warfarin (COUMADIN) 5 MG tablet Take 1 tablet by mouth daily or as directed by coumadin  clinic (Patient taking differently: Take 2.5-5 mg by mouth daily. Take 2.5mg  on Monday, Wednesday and Friday. All other days take 5mg ) 90 tablet 0   No current facility-administered medications for this encounter.    Allergies:    Allergies  Allergen Reactions  . Amoxicillin Rash  . Penicillins Rash    Social History:  The patient  reports that he has never smoked. He has never used smokeless tobacco. He reports that he drinks about 1.2 oz of alcohol per week. He reports that he does not use illicit drugs.   Family history:   Family History  Problem Relation Age of Onset  . Colon cancer Neg Hx   . Heart disease Father     questionable  . Stroke Mother   . Heart failure Sister   . Healthy Brother   . Healthy Sister   . Parkinsonism Brother    ROS:  Please see the history of present illness.  All other systems reviewed and negative.   PHYSICAL EXAM: VS:  BP 94/52 mmHg  Pulse 65  Wt 134 lb 12.8 oz (61.145 kg)  SpO2 97%   Wt Readings from Last 3 Encounters:  08/10/15 134 lb 12.8 oz (61.145 kg)  07/25/15 140 lb (63.504 kg)  07/19/15 143 lb 3.2 oz (64.955 kg)   Well nourished, well developed, in no acute distress HEENT: Normal.  Neck: JVP 6-7 prominent CV wavesNo thyromegaly or nodule noted.  Cardiac: Irregular rate  and rhythm + s3 Lungs:  CTAB, normal effort.  Abd: soft, NT, ND, no HSM. No bruits or masses. +BS  Ext: no lower extremity edema.  2+ radial and dorsalis pedis pulses. Skin: warm and dry Neuro:  Grossly normal   ASSESSMENT AND PLAN: 1. 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 PAF --In Genetic AF trial --AC with warfarin 3. CAD s/p previous anterior infarct 4. LBBB  He continues to improve with switch to torsemide. NYHA remain III, but improving. Has not needed any metolazone.   Continue losartan 12.5 qhs as BP tolerates. Will not up-titrate with soft BP and risk of falls.    Will check BMET today. Can use prn metolazone.  May need work-up for advanced therapies soon.  Follow up 4 weeks.   Shirley Friar, PA-C 12:05 PM  Total time spent > 25 minutes. Over half that spent discussing the above.

## 2015-08-10 NOTE — Telephone Encounter (Signed)
-----   Message from Shirley Friar, PA-C sent at 08/10/2015  1:27 PM EDT ----- Increase potassium to 20 meq BID to match torsemide.    Thanks   [employer], [place] 08/10/2015 1:27 PM

## 2015-08-10 NOTE — Telephone Encounter (Signed)
Pt aware.

## 2015-08-10 NOTE — Patient Instructions (Addendum)
Labs today  Your physician recommends that you schedule a follow-up appointment in: 4 weeks with Oda Kilts, PA  Do the following things EVERYDAY: 1) Weigh yourself in the morning before breakfast. Write it down and keep it in a log. 2) Take your medicines as prescribed 3) Eat low salt foods-Limit salt (sodium) to 2000 mg per day.  4) Stay as active as you can everyday 5) Limit all fluids for the day to less than 2 liters

## 2015-08-11 ENCOUNTER — Ambulatory Visit (INDEPENDENT_AMBULATORY_CARE_PROVIDER_SITE_OTHER): Payer: Medicare Other

## 2015-08-11 DIAGNOSIS — I5042 Chronic combined systolic (congestive) and diastolic (congestive) heart failure: Secondary | ICD-10-CM

## 2015-08-11 DIAGNOSIS — Z95810 Presence of automatic (implantable) cardiac defibrillator: Secondary | ICD-10-CM

## 2015-08-11 NOTE — Progress Notes (Signed)
EPIC Encounter for ICM Monitoring  Patient Name: [name] is a 75 y.o. male Date: 08/11/2015 Primary Care Physican: [name], MD Primary Cardiologist: Kelly/Bensimhon Electrophysiologist: Faustino Congress Weight: 128 lb       Heart Failure questions reviewed, pt asymptomatic.  He stated he is feeling fine and appetite has returned   Thoracic impedence stable.  Recommendations: No changes.  Low sodium diet education provided.  Education given on limiting fluid intake to 64 oz daily.    ICM trend: 08/11/2015     Follow-up plan: ICM clinic phone appointment on 09/14/2015.  Copy of ICM check sent to device physician.   [name], RN 08/11/2015 8:41 AM

## 2015-08-12 DIAGNOSIS — K589 Irritable bowel syndrome without diarrhea: Secondary | ICD-10-CM | POA: Diagnosis not present

## 2015-08-12 DIAGNOSIS — I480 Paroxysmal atrial fibrillation: Secondary | ICD-10-CM | POA: Diagnosis not present

## 2015-08-12 DIAGNOSIS — I255 Ischemic cardiomyopathy: Secondary | ICD-10-CM | POA: Diagnosis not present

## 2015-08-12 DIAGNOSIS — I2510 Atherosclerotic heart disease of native coronary artery without angina pectoris: Secondary | ICD-10-CM | POA: Diagnosis not present

## 2015-08-12 DIAGNOSIS — I5023 Acute on chronic systolic (congestive) heart failure: Secondary | ICD-10-CM | POA: Diagnosis not present

## 2015-08-12 DIAGNOSIS — J189 Pneumonia, unspecified organism: Secondary | ICD-10-CM | POA: Diagnosis not present

## 2015-08-15 ENCOUNTER — Ambulatory Visit (INDEPENDENT_AMBULATORY_CARE_PROVIDER_SITE_OTHER): Payer: Medicare Other | Admitting: Pharmacist Clinician (PhC)/ Clinical Pharmacy Specialist

## 2015-08-15 DIAGNOSIS — J45909 Unspecified asthma, uncomplicated: Secondary | ICD-10-CM | POA: Diagnosis not present

## 2015-08-15 DIAGNOSIS — I480 Paroxysmal atrial fibrillation: Secondary | ICD-10-CM | POA: Diagnosis not present

## 2015-08-15 DIAGNOSIS — Z7901 Long term (current) use of anticoagulants: Secondary | ICD-10-CM

## 2015-08-15 DIAGNOSIS — I252 Old myocardial infarction: Secondary | ICD-10-CM | POA: Diagnosis not present

## 2015-08-15 DIAGNOSIS — Z950 Presence of cardiac pacemaker: Secondary | ICD-10-CM | POA: Diagnosis not present

## 2015-08-15 DIAGNOSIS — Z95810 Presence of automatic (implantable) cardiac defibrillator: Secondary | ICD-10-CM | POA: Diagnosis not present

## 2015-08-15 DIAGNOSIS — E785 Hyperlipidemia, unspecified: Secondary | ICD-10-CM | POA: Diagnosis not present

## 2015-08-15 DIAGNOSIS — I255 Ischemic cardiomyopathy: Secondary | ICD-10-CM | POA: Diagnosis not present

## 2015-08-15 DIAGNOSIS — K589 Irritable bowel syndrome without diarrhea: Secondary | ICD-10-CM | POA: Diagnosis not present

## 2015-08-15 DIAGNOSIS — I2510 Atherosclerotic heart disease of native coronary artery without angina pectoris: Secondary | ICD-10-CM | POA: Diagnosis not present

## 2015-08-15 DIAGNOSIS — I5023 Acute on chronic systolic (congestive) heart failure: Secondary | ICD-10-CM | POA: Diagnosis not present

## 2015-08-15 DIAGNOSIS — Z8701 Personal history of pneumonia (recurrent): Secondary | ICD-10-CM | POA: Diagnosis not present

## 2015-08-15 DIAGNOSIS — K219 Gastro-esophageal reflux disease without esophagitis: Secondary | ICD-10-CM | POA: Diagnosis not present

## 2015-08-15 DIAGNOSIS — Z5181 Encounter for therapeutic drug level monitoring: Secondary | ICD-10-CM | POA: Diagnosis not present

## 2015-08-15 LAB — POCT INR: INR: 2.4

## 2015-08-16 ENCOUNTER — Encounter: Payer: Self-pay | Admitting: Internal Medicine

## 2015-08-16 DIAGNOSIS — Z79899 Other long term (current) drug therapy: Secondary | ICD-10-CM | POA: Diagnosis not present

## 2015-08-16 DIAGNOSIS — M25461 Effusion, right knee: Secondary | ICD-10-CM | POA: Diagnosis not present

## 2015-08-16 DIAGNOSIS — M109 Gout, unspecified: Secondary | ICD-10-CM | POA: Diagnosis not present

## 2015-08-17 ENCOUNTER — Other Ambulatory Visit: Payer: Self-pay | Admitting: Pharmacist Clinician (PhC)/ Clinical Pharmacy Specialist

## 2015-08-22 DIAGNOSIS — K589 Irritable bowel syndrome without diarrhea: Secondary | ICD-10-CM | POA: Diagnosis not present

## 2015-08-22 DIAGNOSIS — I480 Paroxysmal atrial fibrillation: Secondary | ICD-10-CM | POA: Diagnosis not present

## 2015-08-22 DIAGNOSIS — I255 Ischemic cardiomyopathy: Secondary | ICD-10-CM | POA: Diagnosis not present

## 2015-08-22 DIAGNOSIS — J45909 Unspecified asthma, uncomplicated: Secondary | ICD-10-CM | POA: Diagnosis not present

## 2015-08-22 DIAGNOSIS — I2510 Atherosclerotic heart disease of native coronary artery without angina pectoris: Secondary | ICD-10-CM | POA: Diagnosis not present

## 2015-08-22 DIAGNOSIS — I5023 Acute on chronic systolic (congestive) heart failure: Secondary | ICD-10-CM | POA: Diagnosis not present

## 2015-08-25 NOTE — Telephone Encounter (Signed)
This patient is followed in Heart Failure Clinic, will forward 

## 2015-08-29 ENCOUNTER — Ambulatory Visit (INDEPENDENT_AMBULATORY_CARE_PROVIDER_SITE_OTHER): Payer: Medicare Other | Admitting: Pharmacist

## 2015-08-29 DIAGNOSIS — Z7901 Long term (current) use of anticoagulants: Secondary | ICD-10-CM

## 2015-08-29 DIAGNOSIS — I480 Paroxysmal atrial fibrillation: Secondary | ICD-10-CM

## 2015-08-29 DIAGNOSIS — I5023 Acute on chronic systolic (congestive) heart failure: Secondary | ICD-10-CM | POA: Diagnosis not present

## 2015-08-29 DIAGNOSIS — K589 Irritable bowel syndrome without diarrhea: Secondary | ICD-10-CM | POA: Diagnosis not present

## 2015-08-29 DIAGNOSIS — I255 Ischemic cardiomyopathy: Secondary | ICD-10-CM | POA: Diagnosis not present

## 2015-08-29 DIAGNOSIS — J45909 Unspecified asthma, uncomplicated: Secondary | ICD-10-CM | POA: Diagnosis not present

## 2015-08-29 DIAGNOSIS — I2510 Atherosclerotic heart disease of native coronary artery without angina pectoris: Secondary | ICD-10-CM | POA: Diagnosis not present

## 2015-08-29 LAB — POCT INR: INR: 2.4

## 2015-08-31 DIAGNOSIS — M1711 Unilateral primary osteoarthritis, right knee: Secondary | ICD-10-CM | POA: Diagnosis not present

## 2015-08-31 DIAGNOSIS — M25561 Pain in right knee: Secondary | ICD-10-CM | POA: Diagnosis not present

## 2015-09-05 DIAGNOSIS — K589 Irritable bowel syndrome without diarrhea: Secondary | ICD-10-CM | POA: Diagnosis not present

## 2015-09-05 DIAGNOSIS — I2510 Atherosclerotic heart disease of native coronary artery without angina pectoris: Secondary | ICD-10-CM | POA: Diagnosis not present

## 2015-09-05 DIAGNOSIS — J45909 Unspecified asthma, uncomplicated: Secondary | ICD-10-CM | POA: Diagnosis not present

## 2015-09-05 DIAGNOSIS — I5023 Acute on chronic systolic (congestive) heart failure: Secondary | ICD-10-CM | POA: Diagnosis not present

## 2015-09-05 DIAGNOSIS — I480 Paroxysmal atrial fibrillation: Secondary | ICD-10-CM | POA: Diagnosis not present

## 2015-09-05 DIAGNOSIS — I255 Ischemic cardiomyopathy: Secondary | ICD-10-CM | POA: Diagnosis not present

## 2015-09-08 ENCOUNTER — Ambulatory Visit (HOSPITAL_COMMUNITY)
Admission: RE | Admit: 2015-09-08 | Discharge: 2015-09-08 | Disposition: A | Discharge status: Home / Self Care | Payer: Medicare Other | Source: Ambulatory Visit | Attending: Internal Medicine | Admitting: Internal Medicine

## 2015-09-08 VITALS — BP 100/70 | HR 70 | Wt 134.6 lb

## 2015-09-08 DIAGNOSIS — Z823 Family history of stroke: Secondary | ICD-10-CM | POA: Diagnosis not present

## 2015-09-08 DIAGNOSIS — I255 Ischemic cardiomyopathy: Secondary | ICD-10-CM

## 2015-09-08 DIAGNOSIS — Z7982 Long term (current) use of aspirin: Secondary | ICD-10-CM | POA: Diagnosis not present

## 2015-09-08 DIAGNOSIS — Z880 Allergy status to penicillin: Secondary | ICD-10-CM | POA: Insufficient documentation

## 2015-09-08 DIAGNOSIS — I447 Left bundle-branch block, unspecified: Secondary | ICD-10-CM | POA: Diagnosis not present

## 2015-09-08 DIAGNOSIS — Z95810 Presence of automatic (implantable) cardiac defibrillator: Secondary | ICD-10-CM | POA: Insufficient documentation

## 2015-09-08 DIAGNOSIS — I2583 Coronary atherosclerosis due to lipid rich plaque: Secondary | ICD-10-CM

## 2015-09-08 DIAGNOSIS — Z7901 Long term (current) use of anticoagulants: Secondary | ICD-10-CM | POA: Insufficient documentation

## 2015-09-08 DIAGNOSIS — I252 Old myocardial infarction: Secondary | ICD-10-CM | POA: Insufficient documentation

## 2015-09-08 DIAGNOSIS — Z79899 Other long term (current) drug therapy: Secondary | ICD-10-CM | POA: Diagnosis not present

## 2015-09-08 DIAGNOSIS — Z951 Presence of aortocoronary bypass graft: Secondary | ICD-10-CM | POA: Insufficient documentation

## 2015-09-08 DIAGNOSIS — I5022 Chronic systolic (congestive) heart failure: Secondary | ICD-10-CM | POA: Insufficient documentation

## 2015-09-08 DIAGNOSIS — I480 Paroxysmal atrial fibrillation: Secondary | ICD-10-CM | POA: Insufficient documentation

## 2015-09-08 DIAGNOSIS — K219 Gastro-esophageal reflux disease without esophagitis: Secondary | ICD-10-CM | POA: Diagnosis not present

## 2015-09-08 DIAGNOSIS — Z8249 Family history of ischemic heart disease and other diseases of the circulatory system: Secondary | ICD-10-CM | POA: Insufficient documentation

## 2015-09-08 DIAGNOSIS — I2510 Atherosclerotic heart disease of native coronary artery without angina pectoris: Secondary | ICD-10-CM | POA: Insufficient documentation

## 2015-09-08 LAB — BASIC METABOLIC PANEL
Anion gap: 6 (ref 5–15)
BUN: 13 mg/dL (ref 6–20)
CALCIUM: 9.4 mg/dL (ref 8.9–10.3)
CHLORIDE: 96 mmol/L — AB (ref 101–111)
CO2: 33 mmol/L — AB (ref 22–32)
CREATININE: 0.89 mg/dL (ref 0.61–1.24)
GLUCOSE: 98 mg/dL (ref 65–99)
Potassium: 3.5 mmol/L (ref 3.5–5.1)
Sodium: 135 mmol/L (ref 135–145)

## 2015-09-08 MED ORDER — LOSARTAN POTASSIUM 25 MG PO TABS: 25.0000 mg | ORAL_TABLET | Freq: Every day | ORAL | 3 refills | Status: DC

## 2015-09-08 NOTE — Progress Notes (Signed)
Patient ID: [name], male   DOB: 1940/08/29, 75 y.o.   MRN: [id number]    Advanced Heart Failure Clinic Note   Date:  09/08/2015   ID:  [name], [name] 12-25-1940, MRN [id number]  PCP:  [name], MD  Primary Cardiologist:   [name] Referring: Dr. [name]   History of Present Illness: [name] is a 75 y.o. 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 In September 1998 he underwent CABG revascularization surgery after an unsuccessful attempt at stenting of his proximal LAD by [name]. Ejection fraction was 25-30%. In 2002 he underwent initial ICD implantation for nonsustained ventricular tachycardia documented on event monitor for primary prevention. In January 2010 he underwent generator change with a Medtronic Virtuoso single chamber cardioverter defibrillator. Additional problems include paroxysmal atrial fibrillation, occasional PVCs and an attempt was made in the past to overdrive suppress his PVCs with reducing his beta blocker therapy. He felt he had PVCs on the reduced dose and therefore has been on the higher dose.   A nuclear perfusion study in November 2013 showed a large area of scar in the LAD territory (extent 44%) involving the mid to apical anterior, apical and infero-apical to mid infero-septal and apical lateral wall without associated ischemia.  An echo Doppler study on 01/01/2013 revealed a mildly dilated LV. Ejection fraction was 25-30%. There was dyskinesis in scarring of the apical myocardium as well as akinesis of the mid anteroseptal, anterior and anterolateral walls consistent with his prior LAD infarction. He does have a history of paroxysmal atrial fibrillation.    He is on Coumadin anticoagulation for PAF. He was readmitted to the hospital from a May 2 through 06/04/2014 with recurrent atrial fibrillation and started on Tikosyn. He has been enrolled in the  genetic AF trial and is now off Tikosyn and on the study drug of either Bucindolol or metoprolol high dose.  He has been discussing CRT upgrade with Dr. 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 Lisinopril 2.5 recently titrated down from 20 and now stopped due to hypotension.   Study drug has been titrated down now on either Toprol 50 mg daily or 12.5 mg BID.   He returns today for regular follow up. He is down another 6 lbs from previous visit (12 lbs over all from being started on torsemide). Feels better everyday. Walking, lifting weights, and driving all without SOB.  Denies lightheadedness or dizziness. No orthopnea, sleeps on 2 pillows chronically for comfort.  Sleep has improved. SBPs at home in 90-100 range. Weight at home 129 and stable. Still taking low dose prednisone for his knee.  Sees his PCP later this month. Denies bleeding on coumadin.   Echo 5/17 EF 20% RV mildly down.   Optivol interrogation: Did not result at the time of note.    Past Medical History:  Diagnosis Date  . Acute on chronic systolic CHF (congestive heart failure) ([place]) 06/09/2015  . 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 Cardiomyopathy, ischemic 2011   with EF 25-35% by echo  . Coronary artery disease    Hx MI 1992, CABG 1998 , Nuc study 11.2013 large scar but no ischemia  . GERD (gastroesophageal reflux  disease)   . H. pylori infection    Hx of   . 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 NSVT (nonsustained ventricular tachycardia) ([place])   . Paroxysmal atrial fibrillation [facility])     Current Outpatient Prescriptions  Medication Sig Dispense Refill  . acetaminophen (TYLENOL) 650 MG CR tablet Take 650 mg by mouth 2 (two) times daily.       . AMBULATORY NON FORMULARY MEDICATION Take 1 tablet by mouth 2 (two) times daily. Medication Name: Genetic AF Research Study, drug provided.  Toprol XL 50mg  vs bucindolol 12.5 mg.    . aspirin 81 MG chewable tablet Chew 1 tablet (81 mg total) by mouth on Monday, Wednesday, & Friday. 12 tablet 11  . cetirizine (ZYRTEC) 10 MG tablet Take 10 mg by mouth daily.      . Cholecalciferol (VITAMIN D) 2000 UNITS CAPS Take 1 capsule by mouth daily.    Marland Kitchen dicyclomine (BENTYL) 10 MG capsule Take 10 mg by mouth 2 (two) times daily.    Marland Kitchen esomeprazole (NEXIUM) 40 MG capsule Take 1 capsule (40 mg total) by mouth daily at 12 noon. 30 capsule 3  . glycopyrrolate (ROBINUL) 2 MG tablet Take 2 mg by mouth 2 (two) times daily as needed (stomach).    . isosorbide mononitrate (IMDUR) 30 MG 24 hr tablet Take 15 mg by mouth daily.    Marland Kitchen LORazepam (ATIVAN) 1 MG tablet Take 0.5 mg by mouth at bedtime.    Marland Kitchen losartan (COZAAR) 25 MG tablet Take 0.5 tablets (12.5 mg total) by mouth daily. 90 tablet 3  . magnesium oxide (MAG-OX) 400 MG tablet Take 400 mg by mouth daily.    . metolazone (ZAROXOLYN) 2.5 MG tablet Take 1 tablet (2.5 mg total) by mouth daily as needed (if weight 135 lbs or more). Take 40 meq potassium if you take this pill. 2 tablet 0  . mometasone (ASMANEX) 220 MCG/INH inhaler Inhale 1 puff into the lungs daily.     . montelukast (SINGULAIR) 10 MG tablet Take 1 tablet (10 mg total) by mouth at bedtime. PLEASE SCHEDULE APPOINTMENT. 30 tablet 0  . nitroGLYCERIN (NITROSTAT) 0.4 MG SL tablet Place 1 tablet (0.4 mg total) under the tongue every 5 (five) minutes as needed for chest pain. 25 tablet 1  . omeprazole (PRILOSEC) 20 MG capsule Take 1 capsule (20 mg total) by mouth 2 (two) times daily before a meal. 180 capsule 3  . potassium chloride SA (KLOR-CON M20) 20 MEQ tablet Take 1 tablet (20 mEq total) by mouth 2 (two) times daily. 180 tablet 3  . predniSONE (DELTASONE) 10 MG tablet     . silodosin (RAPAFLO) 4 MG CAPS  capsule Take 8 mg by mouth See admin instructions. Every 2 days    . simvastatin (ZOCOR) 40 MG tablet TAKE ONE TABLET BY MOUTH AT BEDTIME 90 tablet 3  . spironolactone (ALDACTONE) 25 MG tablet Take 1 tablet (25 mg total) by mouth daily. 30 tablet 11  . torsemide (DEMADEX) 20 MG tablet Take 1 tablet (20 mg total) by mouth 2 (two) times daily. 60 tablet 6  . warfarin (COUMADIN) 5 MG tablet TAKE ONE TABLET BY MOUTH ONCE DAILY OR AS DIRECTED BY COUMADIN CLINIC 90 tablet 0   No current facility-administered medications for this encounter.     Allergies:    Allergies  Allergen Reactions  . Amoxicillin Rash  . Penicillins Rash    Social History:  The  patient  reports that he has never smoked. He has never used smokeless tobacco. He reports that he drinks about 1.2 oz of alcohol per week . He reports that he does not use drugs.   Family history:   Family History  Problem Relation Age of Onset  . Colon cancer Neg Hx   . Heart disease Father     questionable  . Stroke Mother   . Heart failure Sister   . Healthy Brother   . Healthy Sister   . Parkinsonism Brother    ROS:  Please see the history of present illness.  All other systems reviewed and negative.   PHYSICAL EXAM: VS:  BP 100/70 (BP Location: Left Arm, Patient Position: Sitting, Cuff Size: Normal)   Pulse 70   Wt 134 lb 9.6 oz (61.1 kg)   SpO2 98%   BMI 21.08 kg/m    Wt Readings from Last 3 Encounters:  09/08/15 134 lb 9.6 oz (61.1 kg)  08/10/15 134 lb 12.8 oz (61.1 kg)  07/25/15 140 lb (63.5 kg)   General: Elderly. Well nourished, well developed HEENT: Normal.  Neck: JVP 5-6 with  prominent CV waves No thyromegaly or nodule noted.  Cardiac: Normal S1/S2 with ectopic beats. + s3  Lungs:  CTAB, normal effort.  Abd: soft, NT, ND, no HSM. No bruits or masses. +BS  Ext: no lower extremity edema.  2+ radial and dorsalis pedis pulses. Skin: warm and dry  Neuro:  Grossly normal   ASSESSMENT AND PLAN: 1. Chronic systolic  HF due to iCM, Echo 05/2015 EF 20-25% 2. PAF --In Genetic AF trial --AC with warfarin 3. CAD s/p previous anterior infarct 4. LBBB  He remains stable after switch to torsemide. Continue torsemide 20 mg BID with K 20 meq BID.   NYHA improved to II. Has not needed any extra diuretics.  Increase losartan 25 QHS as tolerated. Low threshold to cut back down.   Check BMET today.   Could eventually need consideration for advanced therapies. Likely consider repeat Echo Nov/December.  Shirley Friar, PA-C 11:28 AM  Patient seen and examined with Oda Kilts, PA-C. We discussed all aspects of the encounter. I agree with the assessment and plan as stated above.   He is much improved. NYHA II-early III. Volume status looks good on exam and Optivol (ICD interrogated personally in Clinic).   No further AF at this point.   Will increased losartan.   Long discussion about activity level (Exercise as tolerated. Can return to tennis). And alcohol consumption (OK to have occasional glass of wine).  Will continue to follow in HF Clinic.   Bensimhon, Daniel,MD 11:39 PM

## 2015-09-08 NOTE — Progress Notes (Signed)
Advanced Heart Failure Medication Review by a Pharmacist  Does the patient  feel that his/her medications are working for him/her?  yes  Has the patient been experiencing any side effects to the medications prescribed?  no  Does the patient measure his/her own blood pressure or blood glucose at home?  no   Does the patient have any problems obtaining medications due to transportation or finances?   no  Understanding of regimen: good Understanding of indications: good Potential of compliance: good Patient understands to avoid NSAIDs. Patient understands to avoid decongestants.  Issues to address at subsequent visits: None   Pharmacist comments:  Mr. Blackmond is a pleasant 75 yo M presenting with his wife and without a medication list. He reports good compliance with his regimen. He was asking if he could eat a banana instead of taking his KCl supplements and we talked about the inconsistent K content in bananas and the fact that the content is likely not enough to meet his requirements.   Ruta Hinds. Velva Harman, PharmD, BCPS, CPP Clinical Pharmacist Pager: [phone] Phone: [phone] 09/08/2015 11:47 AM      Time with patient: 14 minutes Preparation and documentation time: 2 minutes Total time: 16 minutes

## 2015-09-08 NOTE — Patient Instructions (Signed)
Increase Losartan to 25 mg (1 tab) daily AT BEDTIME  Lab today  Your physician recommends that you schedule a follow-up appointment in: 1 month

## 2015-09-13 ENCOUNTER — Telehealth (HOSPITAL_COMMUNITY): Payer: Self-pay

## 2015-09-13 MED ORDER — LOSARTAN POTASSIUM 25 MG PO TABS: 25.0000 mg | ORAL_TABLET | Freq: Every day | ORAL | 3 refills | Status: DC

## 2015-09-13 NOTE — Telephone Encounter (Signed)
Received call to CHF clinic triage from Whigham with [facility] that patient called her to let her know patient was upset that no one from our office had returned his call yesterday concerning dizziness. Unsure what transpired yesterday (no phone notes charted), but called patient directly to follow up. Patient states he has been dizzy in the mornings for a few days, and wonders if it is because of his increase in losartan to 25 mg qhs. BP 100/50s, HR 60s (denies palpitations, irregular HR and states "i can feel when im in afib, im not in that rhythm".  Per Amy Clegg NP-C, advised to cut back down tonight's dose to 12.5 mg as previous prescribed to see if he feels better tomorrow morning.  Will also be doing an optivol transmission. Will follow up with patient in the morning to see if he is feeling better and after reviewing the optivol to further instruct patient. Patient also asking for refill on the losartan.  Will send to preferred pharmacy electronically.  Renee Pain, RN

## 2015-09-14 ENCOUNTER — Ambulatory Visit (INDEPENDENT_AMBULATORY_CARE_PROVIDER_SITE_OTHER): Payer: Medicare Other

## 2015-09-14 ENCOUNTER — Telehealth (HOSPITAL_COMMUNITY): Payer: Self-pay

## 2015-09-14 DIAGNOSIS — I5022 Chronic systolic (congestive) heart failure: Secondary | ICD-10-CM | POA: Diagnosis not present

## 2015-09-14 DIAGNOSIS — Z95810 Presence of automatic (implantable) cardiac defibrillator: Secondary | ICD-10-CM

## 2015-09-14 NOTE — Progress Notes (Signed)
EPIC Encounter for ICM Monitoring  Patient Name: [name] is a 75 y.o. male Date: 09/14/2015 Primary Care Physican: [name], MD Primary Cardiologist: Kelly/Bensimhon Electrophysiologist: Faustino Congress Weight: 130 lb        Heart Failure questions reviewed, pt has gained one pound overnight but feels fine   Thoracic impedance normal.  Thoracic impedance just starting to trend below baseline today.  Advised to follow the low salt diet.    Recommendations: No changes.     Follow-up plan: ICM clinic phone appointment on 10/18/2015.  HF office appointment 10/12/2015.  Copy of ICM check sent to primary cardiologist and device physician.   ICM trend: 09/14/2015        [name], RN 09/14/2015 8:59 AM

## 2015-09-14 NOTE — Telephone Encounter (Signed)
Called patient twice today to follow up from yesterdays triage call concerning low bp and dizziness, no answer, no call back after VM left. Looks like another triage nurse addressed follow up today from another office after reviewing optivol transmission. Advised on VM to call us if anything further needed.  Renee Pain, RN

## 2015-09-19 ENCOUNTER — Ambulatory Visit (INDEPENDENT_AMBULATORY_CARE_PROVIDER_SITE_OTHER): Payer: Medicare Other | Admitting: Pharmacist Clinician (PhC)/ Clinical Pharmacy Specialist

## 2015-09-19 DIAGNOSIS — I2510 Atherosclerotic heart disease of native coronary artery without angina pectoris: Secondary | ICD-10-CM | POA: Diagnosis not present

## 2015-09-19 DIAGNOSIS — Z7901 Long term (current) use of anticoagulants: Secondary | ICD-10-CM

## 2015-09-19 DIAGNOSIS — J45909 Unspecified asthma, uncomplicated: Secondary | ICD-10-CM | POA: Diagnosis not present

## 2015-09-19 DIAGNOSIS — I255 Ischemic cardiomyopathy: Secondary | ICD-10-CM | POA: Diagnosis not present

## 2015-09-19 DIAGNOSIS — K589 Irritable bowel syndrome without diarrhea: Secondary | ICD-10-CM | POA: Diagnosis not present

## 2015-09-19 DIAGNOSIS — I480 Paroxysmal atrial fibrillation: Secondary | ICD-10-CM

## 2015-09-19 DIAGNOSIS — I5023 Acute on chronic systolic (congestive) heart failure: Secondary | ICD-10-CM | POA: Diagnosis not present

## 2015-09-19 LAB — POCT INR: INR: 1.9

## 2015-09-20 ENCOUNTER — Other Ambulatory Visit: Payer: Self-pay | Admitting: Cardiovascular Disease

## 2015-09-24 ENCOUNTER — Other Ambulatory Visit: Payer: Self-pay | Admitting: Internal Medicine

## 2015-09-27 DIAGNOSIS — M109 Gout, unspecified: Secondary | ICD-10-CM | POA: Diagnosis not present

## 2015-09-27 DIAGNOSIS — Z79899 Other long term (current) drug therapy: Secondary | ICD-10-CM | POA: Diagnosis not present

## 2015-09-27 DIAGNOSIS — M25461 Effusion, right knee: Secondary | ICD-10-CM | POA: Diagnosis not present

## 2015-10-04 ENCOUNTER — Telehealth (HOSPITAL_COMMUNITY): Payer: Self-pay | Admitting: *Deleted

## 2015-10-04 MED ORDER — BISOPROLOL FUMARATE 5 MG PO TABS: 2.5000 mg | ORAL_TABLET | Freq: Every day | ORAL | 3 refills | Status: DC

## 2015-10-04 NOTE — Telephone Encounter (Signed)
Per Dr [name] pt ending STUDY and will need new rx for Bisoprolol 2.5 mg daily, rx sent in, research will notify pt

## 2015-10-05 ENCOUNTER — Ambulatory Visit: Payer: Medicare Other | Admitting: Cardiovascular Disease

## 2015-10-05 ENCOUNTER — Ambulatory Visit (INDEPENDENT_AMBULATORY_CARE_PROVIDER_SITE_OTHER): Payer: Medicare Other | Admitting: Pharmacist Clinician (PhC)/ Clinical Pharmacy Specialist

## 2015-10-05 DIAGNOSIS — J45909 Unspecified asthma, uncomplicated: Secondary | ICD-10-CM | POA: Diagnosis not present

## 2015-10-05 DIAGNOSIS — I480 Paroxysmal atrial fibrillation: Secondary | ICD-10-CM | POA: Diagnosis not present

## 2015-10-05 DIAGNOSIS — K589 Irritable bowel syndrome without diarrhea: Secondary | ICD-10-CM | POA: Diagnosis not present

## 2015-10-05 DIAGNOSIS — I5023 Acute on chronic systolic (congestive) heart failure: Secondary | ICD-10-CM | POA: Diagnosis not present

## 2015-10-05 DIAGNOSIS — I2510 Atherosclerotic heart disease of native coronary artery without angina pectoris: Secondary | ICD-10-CM | POA: Diagnosis not present

## 2015-10-05 DIAGNOSIS — Z7901 Long term (current) use of anticoagulants: Secondary | ICD-10-CM

## 2015-10-05 DIAGNOSIS — I255 Ischemic cardiomyopathy: Secondary | ICD-10-CM | POA: Diagnosis not present

## 2015-10-05 LAB — POCT INR: INR: 1.9

## 2015-10-07 ENCOUNTER — Encounter: Payer: Self-pay | Admitting: *Deleted

## 2015-10-07 NOTE — Progress Notes (Unsigned)
Patient ended participation in the Genetic AF research study.  72 hour follow up phone call today with the patient revealed no adverse events nor concomitant medication changes.

## 2015-10-12 ENCOUNTER — Ambulatory Visit (HOSPITAL_COMMUNITY)
Admission: RE | Admit: 2015-10-12 | Discharge: 2015-10-12 | Disposition: A | Discharge status: Home / Self Care | Payer: Medicare Other | Source: Ambulatory Visit | Attending: Internal Medicine | Admitting: Internal Medicine

## 2015-10-12 ENCOUNTER — Encounter (HOSPITAL_COMMUNITY): Payer: Self-pay | Admitting: Internal Medicine

## 2015-10-12 ENCOUNTER — Ambulatory Visit (INDEPENDENT_AMBULATORY_CARE_PROVIDER_SITE_OTHER): Payer: Medicare Other | Admitting: Pharmacist

## 2015-10-12 VITALS — HR 60 | Wt 139.8 lb

## 2015-10-12 DIAGNOSIS — I2510 Atherosclerotic heart disease of native coronary artery without angina pectoris: Secondary | ICD-10-CM | POA: Diagnosis not present

## 2015-10-12 DIAGNOSIS — Z823 Family history of stroke: Secondary | ICD-10-CM | POA: Insufficient documentation

## 2015-10-12 DIAGNOSIS — I5022 Chronic systolic (congestive) heart failure: Secondary | ICD-10-CM | POA: Diagnosis not present

## 2015-10-12 DIAGNOSIS — I480 Paroxysmal atrial fibrillation: Secondary | ICD-10-CM | POA: Diagnosis not present

## 2015-10-12 DIAGNOSIS — Z79899 Other long term (current) drug therapy: Secondary | ICD-10-CM | POA: Insufficient documentation

## 2015-10-12 DIAGNOSIS — I255 Ischemic cardiomyopathy: Secondary | ICD-10-CM | POA: Diagnosis not present

## 2015-10-12 DIAGNOSIS — Z7901 Long term (current) use of anticoagulants: Secondary | ICD-10-CM | POA: Insufficient documentation

## 2015-10-12 DIAGNOSIS — I5023 Acute on chronic systolic (congestive) heart failure: Secondary | ICD-10-CM | POA: Diagnosis not present

## 2015-10-12 DIAGNOSIS — Z8249 Family history of ischemic heart disease and other diseases of the circulatory system: Secondary | ICD-10-CM | POA: Diagnosis not present

## 2015-10-12 DIAGNOSIS — Z880 Allergy status to penicillin: Secondary | ICD-10-CM | POA: Diagnosis not present

## 2015-10-12 DIAGNOSIS — Z951 Presence of aortocoronary bypass graft: Secondary | ICD-10-CM | POA: Insufficient documentation

## 2015-10-12 DIAGNOSIS — J45909 Unspecified asthma, uncomplicated: Secondary | ICD-10-CM | POA: Diagnosis not present

## 2015-10-12 DIAGNOSIS — I447 Left bundle-branch block, unspecified: Secondary | ICD-10-CM | POA: Insufficient documentation

## 2015-10-12 DIAGNOSIS — Z7982 Long term (current) use of aspirin: Secondary | ICD-10-CM | POA: Diagnosis not present

## 2015-10-12 DIAGNOSIS — Z95810 Presence of automatic (implantable) cardiac defibrillator: Secondary | ICD-10-CM | POA: Diagnosis not present

## 2015-10-12 DIAGNOSIS — K589 Irritable bowel syndrome without diarrhea: Secondary | ICD-10-CM | POA: Diagnosis not present

## 2015-10-12 DIAGNOSIS — K219 Gastro-esophageal reflux disease without esophagitis: Secondary | ICD-10-CM | POA: Diagnosis not present

## 2015-10-12 DIAGNOSIS — I252 Old myocardial infarction: Secondary | ICD-10-CM | POA: Insufficient documentation

## 2015-10-12 LAB — POCT INR: INR: 2.1

## 2015-10-12 NOTE — Progress Notes (Signed)
Patient ID: [name], male   DOB: [birth year], 75 y.o.   MRN: [id number]    Advanced Heart Failure Clinic Note   Date:  10/12/2015   ID:  [name] 1940-04-23, MRN [id number]  PCP:  [name], MD  Primary Cardiologist:   Tresa Endo Referring: Dr. [name] Kill   History of Present Illness: [name] is a 75 y.o. 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 In September 1998 he underwent CABG revascularization surgery after an unsuccessful attempt at stenting of his proximal LAD by Dr. Juanda Chance. Ejection fraction was 25-30%. In 2002 he underwent initial ICD implantation for nonsustained ventricular tachycardia documented on event monitor for primary prevention. In January 2010 he underwent generator change with a Medtronic Virtuoso single chamber cardioverter defibrillator. Additional problems include paroxysmal atrial fibrillation.  A nuclear perfusion study in November 2013 showed a large area of scar in the LAD territory (extent 44%) involving the mid to apical anterior, apical and infero-apical to mid infero-septal and apical lateral wall without associated ischemia.  An echo Doppler study on 01/01/2013 revealed a mildly dilated LV. Ejection fraction was 25-30%. There was dyskinesis in scarring of the apical myocardium as well as akinesis of the mid anteroseptal, anterior and anterolateral walls consistent with his prior LAD infarction.    He is on Coumadin anticoagulation for PAF. He was readmitted to the hospital from a May 2 through 06/04/2014 with recurrent atrial fibrillation and started on Tikosyn. He has been enrolled in the genetic AF trial and is now off Tikosyn and on the study drug of either Bucindolol or metoprolol high dose.  He has been discussing CRT upgrade with Dr. Graciela Husbands and has intermediate predictors of benefit (LBBB with QRS almost 150 ms, but ischemic cardiomyopathy and male  gender).    While in the Genetic AF study developed low output HF symptoms with b-blocker titration and had to be cut back.   Genetic AF study completed for pt. He is now on bisoprol 2.5 mg daily.   He returns today for regular follow up. Weight up 4 lbs from last visit.  Very gradual per wife.  Weight at home ~131-132.  Sleeps on 2 pillows. Hasn't been sleeping as well. Takes prn Lorazepam. Continues to lift weights and exercise without difficulty.   SBPs in 100s at home.  Denies bleeding on coumadin.   Echo 5/17 EF 20% RV mildly down.   Optivol interrogation: fluid status trending up slightly, now above threshold for several days. Pt activity ~ 2 hrs a day.   Labs 10/04/15 K 5.3, Creatinine 0.85, BUN 15  Past Medical History:  Diagnosis Date  . Acute on chronic systolic CHF (congestive heart failure) (HCC) 06/09/2015  . 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 Cardiomyopathy, ischemic 2011   with EF 25-35% by echo  . Coronary artery disease    Hx MI 1992, CABG 1998 , Nuc study 11.2013 large scar but no ischemia  . GERD (gastroesophageal reflux disease)   . H. pylori infection    Hx of   . 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 NSVT (nonsustained ventricular tachycardia) (HCC)   . Paroxysmal atrial fibrillation [facility])     Current Outpatient Prescriptions  Medication Sig Dispense Refill  . acetaminophen (TYLENOL) 650 MG CR tablet Take 650 mg by mouth 2 (two)  times daily.     Marland Kitchen allopurinol (ZYLOPRIM) 300 MG tablet Take 300 mg by mouth daily.    Marland Kitchen aspirin 81 MG tablet Take 81 mg by mouth 3 (three) times a week. Monday, Wednesday, Friday    . bisoprolol (ZEBETA) 5 MG tablet Take 0.5 tablets (2.5 mg total) by mouth daily. 15 tablet 3  . cetirizine (ZYRTEC) 10 MG tablet Take 10 mg by mouth daily.      . Cholecalciferol (VITAMIN D) 2000 UNITS CAPS Take 1 capsule by mouth daily.    . colchicine 0.6 MG  tablet Take 0.6 mg by mouth 2 (two) times daily.    Marland Kitchen dicyclomine (BENTYL) 10 MG capsule Take 10 mg by mouth 2 (two) times daily as needed (IBS).     Marland Kitchen esomeprazole (NEXIUM) 40 MG capsule Take 1 capsule (40 mg total) by mouth daily at 12 noon. 30 capsule 3  . glycopyrrolate (ROBINUL) 2 MG tablet Take 2 mg by mouth 2 (two) times daily as needed (stomach).    . isosorbide mononitrate (IMDUR) 30 MG 24 hr tablet Take 15 mg by mouth daily.    Marland Kitchen LORazepam (ATIVAN) 1 MG tablet Take 0.5 mg by mouth at bedtime.    Marland Kitchen losartan (COZAAR) 25 MG tablet Take 1 tablet (25 mg total) by mouth at bedtime. 90 tablet 3  . magnesium oxide (MAG-OX) 400 MG tablet Take 400 mg by mouth daily.    . metolazone (ZAROXOLYN) 2.5 MG tablet Take 1 tablet (2.5 mg total) by mouth daily as needed (if weight 135 lbs or more). Take 40 meq potassium if you take this pill. (Patient not taking: Reported on 09/08/2015) 2 tablet 0  . metolazone (ZAROXOLYN) 2.5 MG tablet TAKE ONE TABLET BY MOUTH BY MOUTH TODAY  AND  TOMORROW 2 tablet 1  . mometasone (ASMANEX) 220 MCG/INH inhaler Inhale 1 puff into the lungs daily.     . montelukast (SINGULAIR) 10 MG tablet Take 1 tablet (10 mg total) by mouth at bedtime. PLEASE SCHEDULE APPOINTMENT. 30 tablet 0  . nitroGLYCERIN (NITROSTAT) 0.4 MG SL tablet Place 1 tablet (0.4 mg total) under the tongue every 5 (five) minutes as needed for chest pain. (Patient not taking: Reported on 09/08/2015) 25 tablet 1  . omeprazole (PRILOSEC) 20 MG capsule Take 1 capsule (20 mg total) by mouth 2 (two) times daily before a meal. 180 capsule 3  . potassium chloride SA (KLOR-CON M20) 20 MEQ tablet Take 1 tablet (20 mEq total) by mouth 2 (two) times daily. 180 tablet 3  . predniSONE (DELTASONE) 10 MG tablet Take 5 mg by mouth daily with breakfast.     . silodosin (RAPAFLO) 4 MG CAPS capsule Take 8 mg by mouth See admin instructions. Every 2 days    . simvastatin (ZOCOR) 40 MG tablet TAKE ONE TABLET BY MOUTH ONCE DAILY AT  BEDTIME 90 tablet 2  . spironolactone (ALDACTONE) 25 MG tablet Take 1 tablet (25 mg total) by mouth daily. 30 tablet 11  . torsemide (DEMADEX) 20 MG tablet Take 1 tablet (20 mg total) by mouth 2 (two) times daily. 60 tablet 6  . warfarin (COUMADIN) 5 MG tablet TAKE ONE TABLET BY MOUTH ONCE DAILY OR AS DIRECTED BY COUMADIN CLINIC 90 tablet 0   No current facility-administered medications for this encounter.     Allergies:    Allergies  Allergen Reactions  . Amoxicillin Rash  . Penicillins Rash    Social History:  The patient  reports that he has  never smoked. He has never used smokeless tobacco. He reports that he drinks about 1.2 oz of alcohol per week . He reports that he does not use drugs.   Family history:   Family History  Problem Relation Age of Onset  . Colon cancer Neg Hx   . Heart disease Father     questionable  . Stroke Mother   . Heart failure Sister   . Healthy Brother   . Healthy Sister   . Parkinsonism Brother    ROS:  Please see the history of present illness.  All other systems reviewed and negative.   PHYSICAL EXAM: VS:  Pulse 60   Wt 139 lb 12 oz (63.4 kg)   SpO2 99%   BMI 21.89 kg/m    Wt Readings from Last 3 Encounters:  10/12/15 139 lb 12 oz (63.4 kg)  09/08/15 134 lb 9.6 oz (61.1 kg)  08/10/15 134 lb 12.8 oz (61.1 kg)   General: Elderly. Well nourished, well developed HEENT: Normal.  Neck: JVP 8 No thyromegaly or nodule noted.  Cardiac: Normal S1/S2 with ectopic beats. + s3  Lungs:  CTAB, normal effort.  Abd: soft, NT, ND, no HSM. No bruits or masses. +BS  Ext: no lower extremity edema.  2+ radial and dorsalis pedis pulses. Skin: warm and dry  Neuro:  Grossly normal   ASSESSMENT AND PLAN: 1. 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 PAF --In Genetic AF trial --AC with warfarin 3. CAD s/p previous anterior infarct 4. LBBB  Overall doing well. NYHA II - possibly early III. Weight up slightly. Continue torsemide 20 mg  BID and take extra 20 mg torsemide in am for next two days.  Can take extra as needed for weight gain.  Would aim for home weight ~ 128 ( 131-132 currently).  Think he only has 2-3 extra lbs on.   Continue losartan 12.5 qhs. Will try to increase at next visit if potassium ok.  Last K 5.3 (10/04/15 per paper he has with him). Stop potassium. Would likely follow with BMET next week. Would not titrate b-blocker further due to problems with low output in past.  Could eventually need consideration for advanced therapies. Likely consider repeat Echo Nov/December.  Durga Saldarriaga,MD 11:21 PM   .

## 2015-10-12 NOTE — Patient Instructions (Signed)
Stop Potassium  Take extra Torsemide tomorrow  Labs in 1 week= Friday 9/22 anytime 9 am-12 pm  Your physician recommends that you schedule a follow-up appointment in: 6 weeks

## 2015-10-18 ENCOUNTER — Encounter: Payer: Self-pay | Admitting: Internal Medicine

## 2015-10-18 ENCOUNTER — Ambulatory Visit (INDEPENDENT_AMBULATORY_CARE_PROVIDER_SITE_OTHER): Payer: Medicare Other | Admitting: *Deleted

## 2015-10-18 ENCOUNTER — Telehealth: Payer: Self-pay

## 2015-10-18 DIAGNOSIS — I255 Ischemic cardiomyopathy: Secondary | ICD-10-CM

## 2015-10-18 DIAGNOSIS — I5022 Chronic systolic (congestive) heart failure: Secondary | ICD-10-CM | POA: Diagnosis not present

## 2015-10-18 DIAGNOSIS — Z95810 Presence of automatic (implantable) cardiac defibrillator: Secondary | ICD-10-CM | POA: Diagnosis not present

## 2015-10-18 NOTE — Progress Notes (Signed)
EPIC Encounter for ICM Monitoring  Patient Name: [name] is a 75 y.o. male Date: 10/18/2015 Primary Care Physican: [name], MD Primary Cardiologist:Kelly/Bensimhon Electrophysiologist: [name] Dry Weight:  unknown       Attempted ICM call and unable to reach.  Transmission reviewed.   Thoracic impedance returned to normal after Dr [name] increased Torsemide to 40 mg am and 20 mg pm x 2 days on 10/12/2015 per office note. He was instructed to return to Torsemide 20 mg BID after 2nd day.  Per office note he can take extra as needed for weight gain and aim for home weight ~ 128 lbs.   Recommendations:  None, unable to reach today   Follow-up plan: ICM clinic phone appointment on 11/18/2015.  Copy of ICM check sent to primary cardiologist and device physician.   ICM trend: 10/18/2015       [name], RN 10/18/2015 11:52 AM

## 2015-10-18 NOTE — Telephone Encounter (Signed)
Remote ICM transmission received.  Attempted patient call and left message for return call.   

## 2015-10-18 NOTE — Progress Notes (Signed)
Patient returned call.  He denied fluid symptoms.  Explained transmission is back to baseline after taking extra Torsemide as ordered by Dr [name].  He stated he only took the extra med 1 day.  Advised next transmission is 11/18/2015.   Patient concerned his glucose was 149 but it was not a FBS.  Explained it is normal for glucose readings to rise after eating.   No changes today.

## 2015-10-18 NOTE — Progress Notes (Signed)
Remote ICD transmission.   

## 2015-10-19 ENCOUNTER — Encounter: Payer: Self-pay | Admitting: Cardiology

## 2015-10-20 ENCOUNTER — Encounter: Payer: Self-pay | Admitting: Internal Medicine

## 2015-10-20 ENCOUNTER — Telehealth (HOSPITAL_COMMUNITY): Payer: Self-pay

## 2015-10-20 NOTE — Telephone Encounter (Signed)
Patient calling CHF clinic asking if glucose would be in tomorrow's lab draw. Confirmed this was included in his labs, however that was not why we were daring the lab as we do not manage glucose levels. Patient understands and just wanted to know his glucose level out of curiosity. No further questions/concerns/needs.  Renee Pain, RN

## 2015-10-21 ENCOUNTER — Ambulatory Visit (HOSPITAL_COMMUNITY)
Admission: RE | Admit: 2015-10-21 | Discharge: 2015-10-21 | Disposition: A | Discharge status: Home / Self Care | Payer: Medicare Other | Source: Ambulatory Visit | Attending: Cardiology | Admitting: Cardiology

## 2015-10-21 ENCOUNTER — Telehealth (HOSPITAL_COMMUNITY): Payer: Self-pay

## 2015-10-21 DIAGNOSIS — I5022 Chronic systolic (congestive) heart failure: Secondary | ICD-10-CM | POA: Insufficient documentation

## 2015-10-21 DIAGNOSIS — I5023 Acute on chronic systolic (congestive) heart failure: Secondary | ICD-10-CM | POA: Diagnosis not present

## 2015-10-21 LAB — BASIC METABOLIC PANEL
ANION GAP: 7 (ref 5–15)
BUN: 12 mg/dL (ref 6–20)
CHLORIDE: 102 mmol/L (ref 101–111)
CO2: 31 mmol/L (ref 22–32)
Calcium: 9.8 mg/dL (ref 8.9–10.3)
Creatinine, Ser: 0.86 mg/dL (ref 0.61–1.24)
GFR calc non Af Amer: >60 mL/min (ref >60)
Glucose, Bld: 100 mg/dL — ABNORMAL HIGH (ref 65–99)
Potassium: 3.5 mmol/L (ref 3.5–5.1)
Sodium: 140 mmol/L (ref 135–145)

## 2015-10-21 NOTE — Telephone Encounter (Signed)
Patient calling for lab results from this morning.  Advised that MD has not reviewed them yet, however patient would like a copy faxed anyways. Advised we may be back in touch with him for any updates/changes after MD reviews these preliminary results Copy faxed to provided fax # [phone]  Renee Pain, RN

## 2015-10-24 ENCOUNTER — Ambulatory Visit (INDEPENDENT_AMBULATORY_CARE_PROVIDER_SITE_OTHER): Payer: Medicare Other | Admitting: Pharmacist

## 2015-10-24 DIAGNOSIS — H4720 Unspecified optic atrophy: Secondary | ICD-10-CM | POA: Diagnosis not present

## 2015-10-24 DIAGNOSIS — H5340 Unspecified visual field defects: Secondary | ICD-10-CM | POA: Diagnosis not present

## 2015-10-24 DIAGNOSIS — I480 Paroxysmal atrial fibrillation: Secondary | ICD-10-CM

## 2015-10-24 DIAGNOSIS — Z7901 Long term (current) use of anticoagulants: Secondary | ICD-10-CM

## 2015-10-24 DIAGNOSIS — H26491 Other secondary cataract, right eye: Secondary | ICD-10-CM | POA: Diagnosis not present

## 2015-10-24 DIAGNOSIS — H524 Presbyopia: Secondary | ICD-10-CM | POA: Diagnosis not present

## 2015-10-24 DIAGNOSIS — I4891 Unspecified atrial fibrillation: Secondary | ICD-10-CM | POA: Diagnosis not present

## 2015-10-24 LAB — POCT INR: INR: 2.1

## 2015-10-24 IMAGING — CR DG CHEST 2V
3 series · 3 of 3 positions shown · non-contrast
Comparison: None.

CLINICAL DATA: Nonproductive cough without shortness of breath or
wheezing

EXAM:
CHEST  2 VIEW

[PA (1 of 2)]
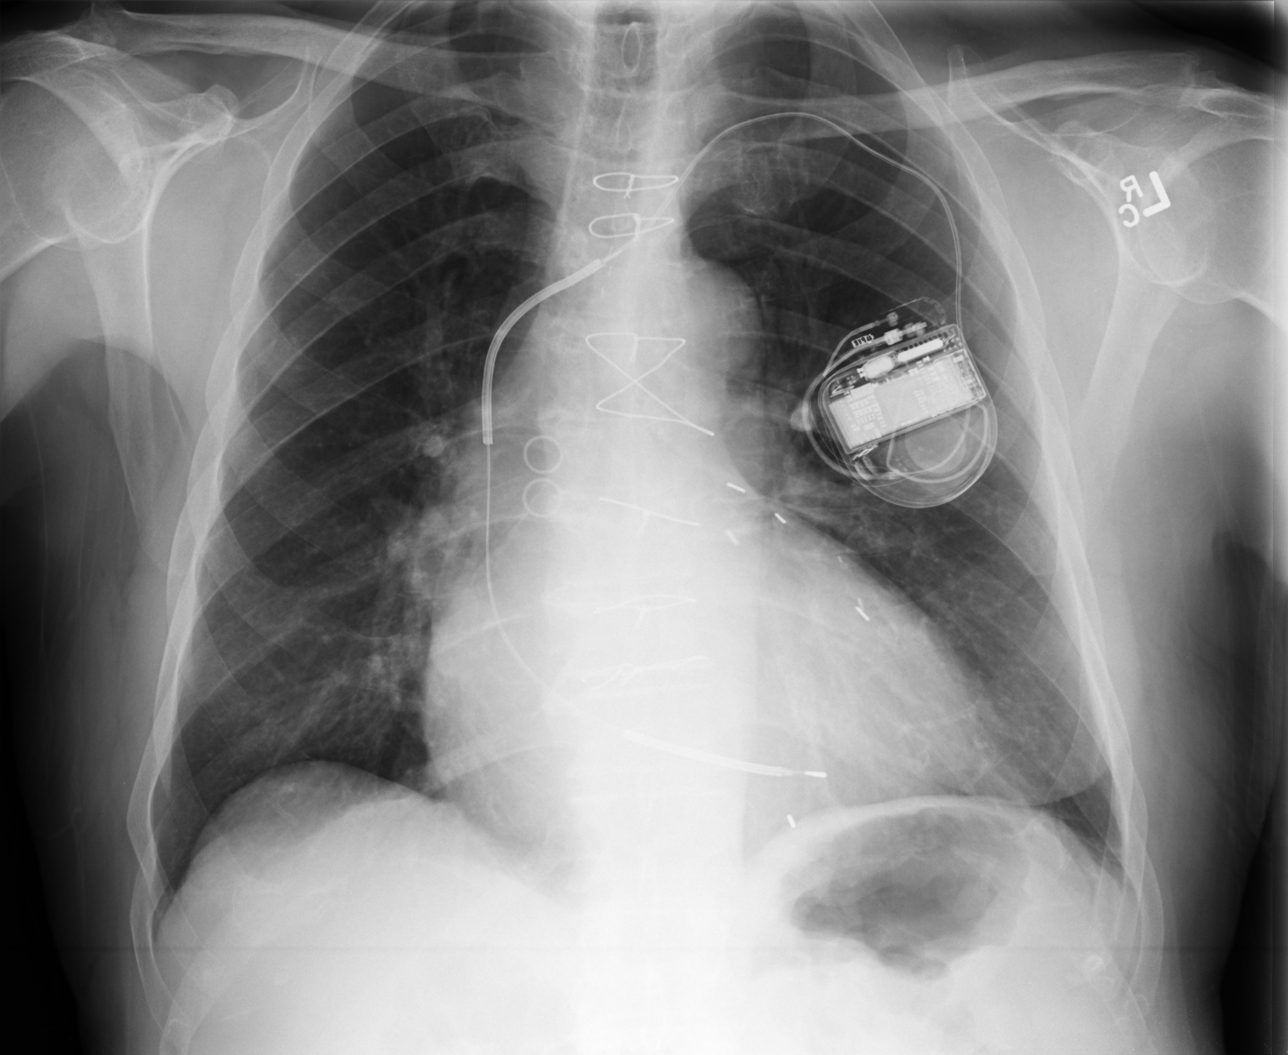

[lateral]
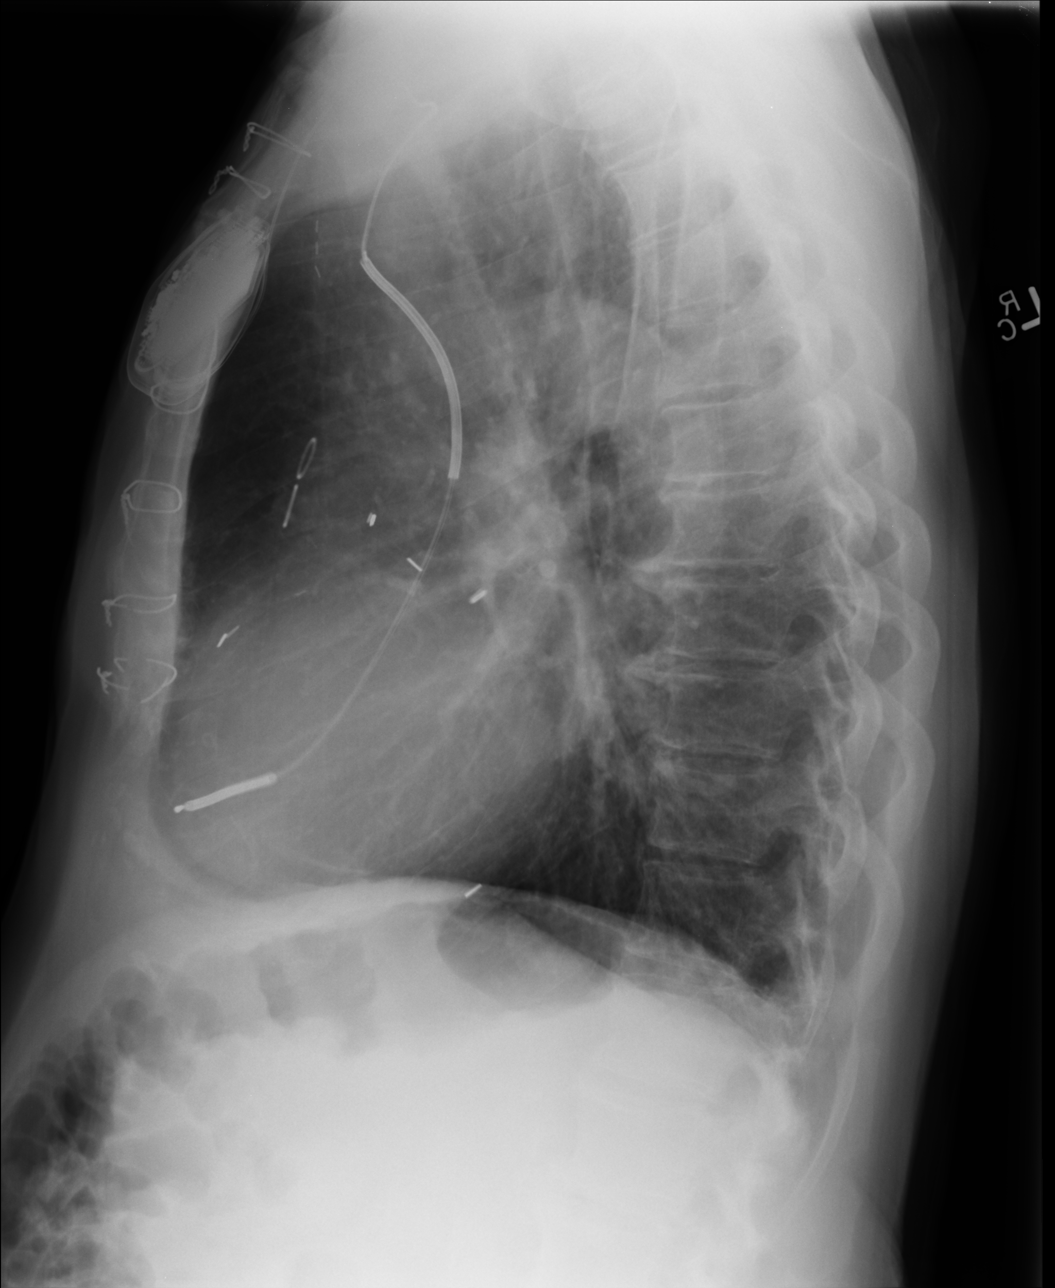

[PA (2 of 2)]
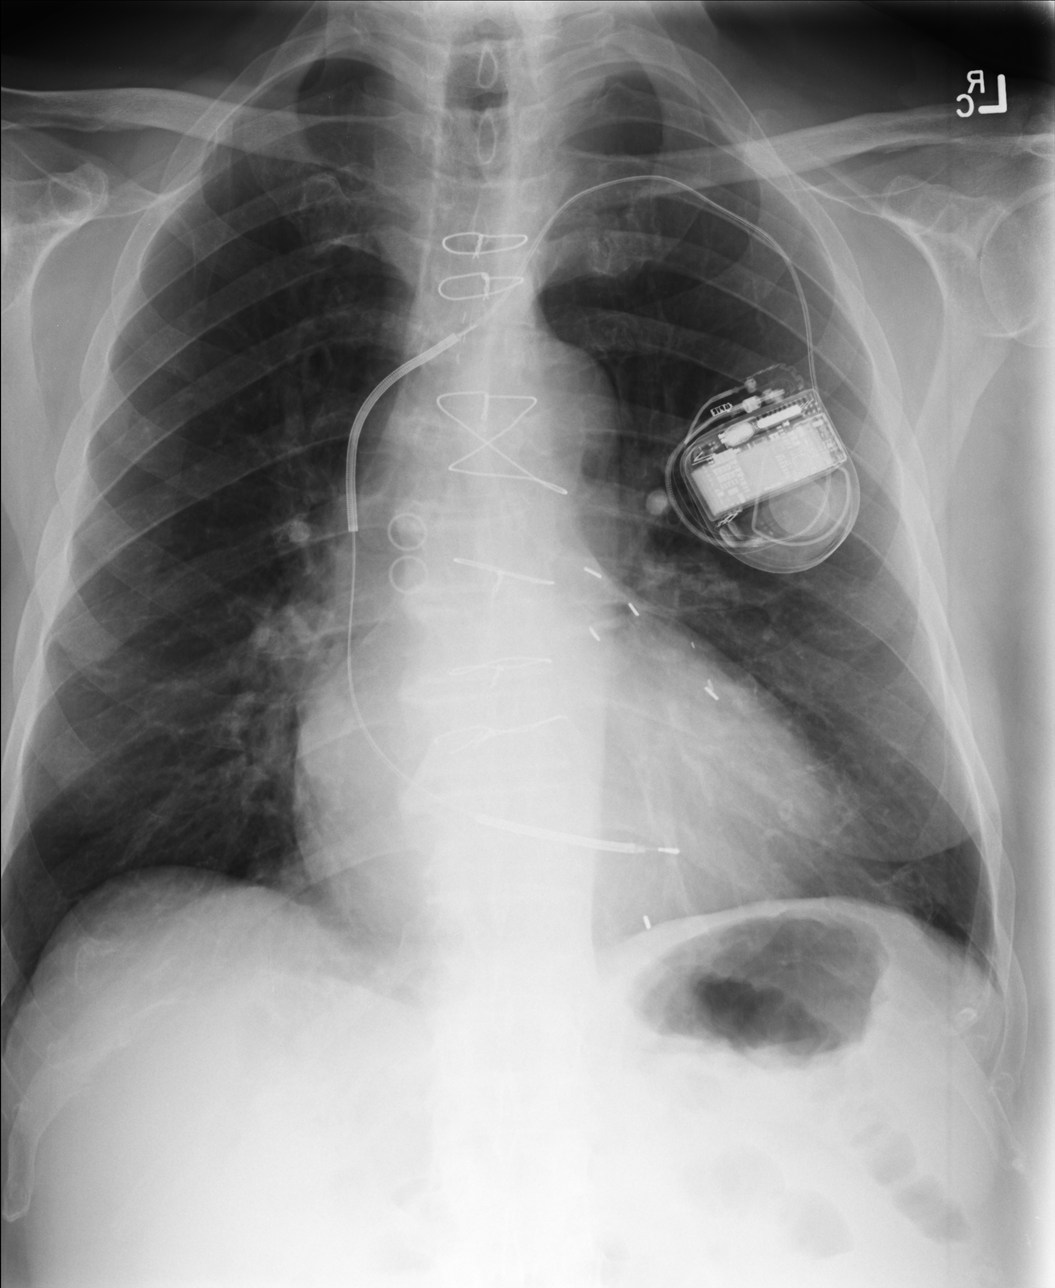

[3 of 3 positions shown; findings below may reference images not displayed]

FINDINGS: The lungs are mildly hyperinflated. There is no focal infiltrate.
There is no pleural effusion or pneumothorax. The cardiac silhouette
is mildly enlarged. The pulmonary vascularity is normal. The AICD is
in reasonable position. There are post CABG changes. The bony thorax
exhibits no acute abnormality. The 2 lowermost sternal wires are
broken.
IMPRESSION: COPD and with mild cardiomegaly. There is no pulmonary edema or
pneumonia.

## 2015-11-07 ENCOUNTER — Encounter: Payer: Self-pay | Admitting: *Deleted

## 2015-11-07 LAB — CUP PACEART REMOTE DEVICE CHECK
Battery Voltage: 2.66 V
Brady Statistic RV Percent Paced: 0 %
Date Time Interrogation Session: 20170919052307
HIGH POWER IMPEDANCE MEASURED VALUE: 50 Ohm
HighPow Impedance: 76 Ohm
Implantable Lead Implant Date: 20010806
Implantable Lead Location: 753860
Implantable Lead Model: 147
Implantable Lead Serial Number: 104581
Lead Channel Setting Sensing Sensitivity: 0.3 mV
MDC IDC MSMT LEADCHNL RV IMPEDANCE VALUE: 1056 Ohm
MDC IDC MSMT LEADCHNL RV SENSING INTR AMPL: 14.643 mV
MDC IDC SET LEADCHNL RV PACING AMPLITUDE: 2.5 V
MDC IDC SET LEADCHNL RV PACING PULSEWIDTH: 0.7 ms

## 2015-11-14 ENCOUNTER — Ambulatory Visit (INDEPENDENT_AMBULATORY_CARE_PROVIDER_SITE_OTHER): Payer: Medicare Other | Admitting: Pharmacist

## 2015-11-14 DIAGNOSIS — Z7901 Long term (current) use of anticoagulants: Secondary | ICD-10-CM

## 2015-11-14 DIAGNOSIS — I4891 Unspecified atrial fibrillation: Secondary | ICD-10-CM | POA: Diagnosis not present

## 2015-11-14 DIAGNOSIS — I480 Paroxysmal atrial fibrillation: Secondary | ICD-10-CM | POA: Diagnosis not present

## 2015-11-14 LAB — POCT INR: INR: 2.3

## 2015-11-15 ENCOUNTER — Telehealth: Payer: Self-pay | Admitting: Internal Medicine

## 2015-11-15 DIAGNOSIS — J3089 Other allergic rhinitis: Secondary | ICD-10-CM | POA: Diagnosis not present

## 2015-11-15 DIAGNOSIS — J3081 Allergic rhinitis due to animal (cat) (dog) hair and dander: Secondary | ICD-10-CM | POA: Diagnosis not present

## 2015-11-15 DIAGNOSIS — J4540 Moderate persistent asthma, uncomplicated: Secondary | ICD-10-CM | POA: Diagnosis not present

## 2015-11-15 DIAGNOSIS — J301 Allergic rhinitis due to pollen: Secondary | ICD-10-CM | POA: Diagnosis not present

## 2015-11-15 NOTE — Telephone Encounter (Signed)
error 

## 2015-11-18 ENCOUNTER — Ambulatory Visit (INDEPENDENT_AMBULATORY_CARE_PROVIDER_SITE_OTHER): Payer: Medicare Other

## 2015-11-18 ENCOUNTER — Other Ambulatory Visit: Payer: Self-pay | Admitting: Cardiovascular Disease

## 2015-11-18 DIAGNOSIS — I5022 Chronic systolic (congestive) heart failure: Secondary | ICD-10-CM | POA: Diagnosis not present

## 2015-11-18 DIAGNOSIS — Z95810 Presence of automatic (implantable) cardiac defibrillator: Secondary | ICD-10-CM | POA: Diagnosis not present

## 2015-11-18 NOTE — Progress Notes (Signed)
EPIC Encounter for ICM Monitoring  Patient Name: [name] is a 75 y.o. male Date: 11/18/2015 Primary Care Physican: [name], MD Primary Cardiologist:Kelly/Bensimhon Electrophysiologist: [name] Dry Weight:     unknown      Heart Failure questions reviewed, pt asymptomatic   Thoracic impedance normal but starting to trend below baseline which suggests fluid accumulation developing.    Recommendations: No changes.  Advised to limit salt intake to 2000 mg daily.  Encouraged to call for fluid symptoms.    Follow-up plan: ICM clinic phone appointment on 12/28/2015.  Office appointment with Dr [name] 11/24/2015  Copy of ICM check sent to device physician.   ICM trend: 11/18/2015             [name], RN 11/18/2015 8:44 AM

## 2015-11-22 DIAGNOSIS — J321 Chronic frontal sinusitis: Secondary | ICD-10-CM | POA: Diagnosis not present

## 2015-11-22 DIAGNOSIS — J310 Chronic rhinitis: Secondary | ICD-10-CM | POA: Diagnosis not present

## 2015-11-22 DIAGNOSIS — H6121 Impacted cerumen, right ear: Secondary | ICD-10-CM | POA: Diagnosis not present

## 2015-11-22 DIAGNOSIS — J320 Chronic maxillary sinusitis: Secondary | ICD-10-CM | POA: Diagnosis not present

## 2015-11-24 ENCOUNTER — Ambulatory Visit (HOSPITAL_COMMUNITY)
Admission: RE | Admit: 2015-11-24 | Discharge: 2015-11-24 | Disposition: A | Discharge status: Home / Self Care | Payer: Medicare Other | Source: Ambulatory Visit | Attending: Internal Medicine | Admitting: Internal Medicine

## 2015-11-24 ENCOUNTER — Encounter (HOSPITAL_COMMUNITY): Payer: Self-pay | Admitting: Internal Medicine

## 2015-11-24 VITALS — BP 118/62 | HR 64 | Wt 142.5 lb

## 2015-11-24 DIAGNOSIS — Z95810 Presence of automatic (implantable) cardiac defibrillator: Secondary | ICD-10-CM | POA: Diagnosis not present

## 2015-11-24 DIAGNOSIS — Z7901 Long term (current) use of anticoagulants: Secondary | ICD-10-CM | POA: Diagnosis not present

## 2015-11-24 DIAGNOSIS — I2510 Atherosclerotic heart disease of native coronary artery without angina pectoris: Secondary | ICD-10-CM | POA: Diagnosis not present

## 2015-11-24 DIAGNOSIS — Z7982 Long term (current) use of aspirin: Secondary | ICD-10-CM | POA: Insufficient documentation

## 2015-11-24 DIAGNOSIS — Z951 Presence of aortocoronary bypass graft: Secondary | ICD-10-CM | POA: Insufficient documentation

## 2015-11-24 DIAGNOSIS — I2583 Coronary atherosclerosis due to lipid rich plaque: Secondary | ICD-10-CM

## 2015-11-24 DIAGNOSIS — I447 Left bundle-branch block, unspecified: Secondary | ICD-10-CM | POA: Diagnosis not present

## 2015-11-24 DIAGNOSIS — I5022 Chronic systolic (congestive) heart failure: Secondary | ICD-10-CM | POA: Insufficient documentation

## 2015-11-24 DIAGNOSIS — K219 Gastro-esophageal reflux disease without esophagitis: Secondary | ICD-10-CM | POA: Diagnosis not present

## 2015-11-24 DIAGNOSIS — I255 Ischemic cardiomyopathy: Secondary | ICD-10-CM | POA: Diagnosis not present

## 2015-11-24 DIAGNOSIS — I252 Old myocardial infarction: Secondary | ICD-10-CM | POA: Diagnosis not present

## 2015-11-24 DIAGNOSIS — I480 Paroxysmal atrial fibrillation: Secondary | ICD-10-CM | POA: Insufficient documentation

## 2015-11-24 MED ORDER — LOSARTAN POTASSIUM 25 MG PO TABS: 25.0000 mg | ORAL_TABLET | Freq: Every day | ORAL | 3 refills | Status: DC

## 2015-11-24 NOTE — Patient Instructions (Signed)
INCREASE Losartan to 25mg  daily at bedtime.  Follow up and Echo with Dr.Bensimhon in 2 months.

## 2015-11-24 NOTE — Progress Notes (Signed)
Patient ID: [name], male   DOB: 1941/01/27, 75 y.o.   MRN: [id number]    Advanced Heart Failure Clinic Note   Date:  11/24/2015   ID:  Tramone, Bentzel 06-Nov-1940, MRN [id number]  PCP:  [name], MD  Primary Cardiologist:  Tresa Endo Referring: Dr. [name] Kill   History of Present Illness: [name] is a 75 y.o. 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 In September 1998 he underwent CABG revascularization surgery after an unsuccessful attempt at stenting of his proximal LAD by Dr. Juanda Chance. Ejection fraction was 25-30%. In 2002 he underwent initial ICD implantation for nonsustained ventricular tachycardia documented on event monitor for primary prevention. In January 2010 he underwent generator change with a Medtronic Virtuoso single chamber cardioverter defibrillator.  A nuclear perfusion study in November 2013 showed a large area of scar in the LAD territory (extent 44%) involving the mid to apical anterior,apical and infero-apical to mid infero-septal and apical lateral wall without associated ischemia.  He is on Coumadin anticoagulation for PAF. He was readmitted to the hospital from a May 2 through 06/04/2014 with recurrent atrial fibrillation and started on Tikosyn. He was enrolled in the genetic AF trial (bucindolol vs Toprol). He did poorly in the trial in the setting of titration of beta-blocker. He was referred to HF Clinic. Genetic AF study completed for pt. He is now on bisoprol 2.5 mg daily.   He has been discussing CRT upgrade with Dr. Graciela Husbands and has intermediate predictors of benefit (LBBB with QRS almost 150 ms, but ischemic cardiomyopathy and male gender).    He presents today for regular follow up.  With weight up 4 lbs was told to take several extra days of torsemide. Weight shows up 3 more lbs since last visit. Weight at home ~134-135. Sleeps on 2 pillows  chronically.  Exercising 3-4 a week, walks for 45 minutes without SOB.  Has been moving so has fallen off slightly.  Also does silver sneaker. SBPs at home in upper 90s or low 100s. Seldom lightheadedness with rapid standing but otherwise no difficult. Denies bleeding on coumadin.   Echo 5/17 EF 20% RV mildly down.   Optivol interrogation: 11/18/15. Thoracic impedence down slightly.  Optivol low but had mild uptrend.  Labs 10/04/15 K 5.3, Creatinine 0.85, BUN 15  Past Medical History:  Diagnosis Date  . Acute on chronic systolic CHF (congestive heart failure) (HCC) 06/09/2015  . 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 Cardiomyopathy, ischemic 2011   with EF 25-35% by echo  . Coronary artery disease    Hx MI 1992, CABG 1998 , Nuc study 11.2013 large scar but no ischemia  . GERD (gastroesophageal reflux disease)   . H. pylori infection    Hx of   . 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 NSVT (nonsustained ventricular tachycardia) (HCC)   . Paroxysmal atrial fibrillation [facility])     Current Outpatient Prescriptions  Medication Sig Dispense Refill  . acetaminophen (TYLENOL) 650 MG CR tablet Take 650 mg by mouth 2 (two) times daily.     Marland Kitchen allopurinol (ZYLOPRIM) 300 MG tablet Take 300 mg by mouth daily.    Marland Kitchen aspirin 81 MG tablet Take 81 mg by mouth 3 (three) times a week. Monday, Wednesday, Friday    . bisoprolol (ZEBETA)  5 MG tablet Take 0.5 tablets (2.5 mg total) by mouth daily. 15 tablet 3  . cetirizine (ZYRTEC) 10 MG tablet Take 10 mg by mouth daily.      . Cholecalciferol (VITAMIN D) 2000 UNITS CAPS Take 1 capsule by mouth daily.    Marland Kitchen esomeprazole (NEXIUM) 40 MG capsule Take 1 capsule (40 mg total) by mouth daily at 12 noon. 30 capsule 3  . glycopyrrolate (ROBINUL) 2 MG tablet Take 2 mg by mouth 2 (two) times daily as needed (stomach).    . isosorbide mononitrate (IMDUR) 30 MG 24 hr tablet Take 1  tablet (30 mg total) by mouth daily. PLEASE CONTACT OFFICE FOR ADDITIONAL REFILLS 30 tablet 0  . losartan (COZAAR) 25 MG tablet Take 12.5 mg by mouth daily.    . magnesium oxide (MAG-OX) 400 MG tablet Take 400 mg by mouth daily.    . mometasone (ASMANEX) 220 MCG/INH inhaler Inhale 1 puff into the lungs daily.     . montelukast (SINGULAIR) 10 MG tablet Take 1 tablet (10 mg total) by mouth at bedtime. PLEASE SCHEDULE APPOINTMENT. 30 tablet 0  . omeprazole (PRILOSEC) 20 MG capsule Take 1 capsule (20 mg total) by mouth 2 (two) times daily before a meal. 180 capsule 3  . silodosin (RAPAFLO) 4 MG CAPS capsule Take 8 mg by mouth See admin instructions. Every 2 days    . simvastatin (ZOCOR) 40 MG tablet TAKE ONE TABLET BY MOUTH ONCE DAILY AT BEDTIME 90 tablet 2  . spironolactone (ALDACTONE) 25 MG tablet Take 1 tablet (25 mg total) by mouth daily. 30 tablet 11  . torsemide (DEMADEX) 20 MG tablet Take 1 tablet (20 mg total) by mouth 2 (two) times daily. 60 tablet 6  . warfarin (COUMADIN) 5 MG tablet TAKE ONE TABLET BY MOUTH ONCE DAILY OR AS DIRECTED BY COUMADIN CLINIC 90 tablet 0  . dicyclomine (BENTYL) 10 MG capsule Take 10 mg by mouth 2 (two) times daily as needed (IBS).     . LORazepam (ATIVAN) 1 MG tablet Take 0.5 mg by mouth at bedtime.    . metolazone (ZAROXOLYN) 2.5 MG tablet Take 1 tablet (2.5 mg total) by mouth daily as needed (if weight 135 lbs or more). Take 40 meq potassium if you take this pill. (Patient not taking: Reported on 11/24/2015) 2 tablet 0  . nitroGLYCERIN (NITROSTAT) 0.4 MG SL tablet Place 1 tablet (0.4 mg total) under the tongue every 5 (five) minutes as needed for chest pain. (Patient not taking: Reported on 11/24/2015) 25 tablet 1   No current facility-administered medications for this encounter.     Allergies:    Allergies  Allergen Reactions  . Amoxicillin Rash  . Penicillins Rash    Social History:  The patient  reports that he has never smoked. He has never used  smokeless tobacco. He reports that he drinks about 1.2 oz of alcohol per week . He reports that he does not use drugs.   Family history:   Family History  Problem Relation Age of Onset  . Heart disease Father     questionable  . Stroke Mother   . Heart failure Sister   . Healthy Brother   . Healthy Sister   . Parkinsonism Brother   . Colon cancer Neg Hx    ROS:  Please see the history of present illness.  All other systems reviewed and negative.   PHYSICAL EXAM: VS:  BP 118/62   Pulse 64   Wt 142 lb 8  oz (64.6 kg)   SpO2 100%   BMI 22.32 kg/m    Wt Readings from Last 3 Encounters:  11/24/15 142 lb 8 oz (64.6 kg)  10/12/15 139 lb 12 oz (63.4 kg)  09/08/15 134 lb 9.6 oz (61.1 kg)   General: Elderly. WDWN HEENT: Normal.  Neck: JVP 6-7 No thyromegaly or nodule noted.  Cardiac: Normal S1/S2 with ectopic beats. + s3  Lungs:  CTAB, normal effort.  Abd: soft, NT, ND, no HSM. No bruits or masses. +BS  Ext: no lower extremity edema.  2+ radial and dorsalis pedis pulses. Skin: warm and dry  Neuro:  Grossly normal   ASSESSMENT AND PLAN: 1. 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 PAF --Has been manitaining NSR --AC with warfarin --Completed Genetic AF trial 3. CAD s/p previous anterior infarct --stable no ischemia.  4. LBBB  Overall doing well. NYHA II from what he describes.  Weight up slightly but volume status stable on exam and optivol. Continue torsemide 20 mg BID. Can take extra 20 mg torsemide as needed.   Increase losartan 25 mg qhs as tolerated. OK to decrease back to losartan 12.5 qhs as needed.   Follow up 2 months with ECHO.   Graciella Freer, PA-C 2:16 PM   Patient seen and examined with Otilio Saber, PA-C. We discussed all aspects of the encounter. I agree with the assessment and plan as stated above.   He remains stable. NYHA II-III. Volume status stable by exam and ICD interrogation. Maintaining NSR. On coumadin. BP soft. Will  attempt to titrate losartan slowly.   Will get CPX test on near future to further risk stratify for advanced therapies. Will need to f/u with EP regarding CRT. QRS may not be wide enough to benefit.   Maisyn Nouri,MD 3:03 PM     .

## 2015-11-30 ENCOUNTER — Emergency Department (HOSPITAL_BASED_OUTPATIENT_CLINIC_OR_DEPARTMENT_OTHER)
Admission: EM | Admit: 2015-11-30 | Discharge: 2015-11-30 | Disposition: A | Discharge status: Home / Self Care | Payer: Medicare Other | Attending: Emergency Medicine | Admitting: Emergency Medicine

## 2015-11-30 ENCOUNTER — Emergency Department (HOSPITAL_BASED_OUTPATIENT_CLINIC_OR_DEPARTMENT_OTHER): Payer: Medicare Other

## 2015-11-30 ENCOUNTER — Encounter (HOSPITAL_BASED_OUTPATIENT_CLINIC_OR_DEPARTMENT_OTHER): Payer: Self-pay | Admitting: Emergency Medicine

## 2015-11-30 DIAGNOSIS — I2510 Atherosclerotic heart disease of native coronary artery without angina pectoris: Secondary | ICD-10-CM | POA: Insufficient documentation

## 2015-11-30 DIAGNOSIS — J45909 Unspecified asthma, uncomplicated: Secondary | ICD-10-CM | POA: Diagnosis not present

## 2015-11-30 DIAGNOSIS — I5023 Acute on chronic systolic (congestive) heart failure: Secondary | ICD-10-CM | POA: Diagnosis not present

## 2015-11-30 DIAGNOSIS — Z951 Presence of aortocoronary bypass graft: Secondary | ICD-10-CM | POA: Diagnosis not present

## 2015-11-30 DIAGNOSIS — G510 Bell's palsy: Secondary | ICD-10-CM | POA: Diagnosis not present

## 2015-11-30 DIAGNOSIS — Z7982 Long term (current) use of aspirin: Secondary | ICD-10-CM | POA: Insufficient documentation

## 2015-11-30 DIAGNOSIS — Z7901 Long term (current) use of anticoagulants: Secondary | ICD-10-CM | POA: Diagnosis not present

## 2015-11-30 DIAGNOSIS — Z95810 Presence of automatic (implantable) cardiac defibrillator: Secondary | ICD-10-CM | POA: Diagnosis not present

## 2015-11-30 DIAGNOSIS — R29810 Facial weakness: Secondary | ICD-10-CM | POA: Diagnosis present

## 2015-11-30 LAB — PROTIME-INR
INR: 2.16
PROTHROMBIN TIME: 24.4 s — AB (ref 11.4–15.2)

## 2015-11-30 LAB — CBC
HCT: 43.9 % (ref 39.0–52.0)
Hemoglobin: 14.6 g/dL (ref 13.0–17.0)
MCH: 29.2 pg (ref 26.0–34.0)
MCHC: 33.3 g/dL (ref 30.0–36.0)
MCV: 87.8 fL (ref 78.0–100.0)
PLATELETS: 265 10*3/uL (ref 150–400)
RBC: 5 MIL/uL (ref 4.22–5.81)
RDW: 13.9 % (ref 11.5–15.5)
WBC: 10.3 10*3/uL (ref 4.0–10.5)

## 2015-11-30 LAB — DIFFERENTIAL
BASOS PCT: 1 %
Basophils Absolute: 0.1 10*3/uL (ref 0.0–0.1)
EOS PCT: 3 %
Eosinophils Absolute: 0.3 10*3/uL (ref 0.0–0.7)
Lymphocytes Relative: 17 %
Lymphs Abs: 1.8 10*3/uL (ref 0.7–4.0)
MONO ABS: 1.1 10*3/uL — AB (ref 0.1–1.0)
Monocytes Relative: 11 %
NEUTROS ABS: 7.1 10*3/uL (ref 1.7–7.7)
Neutrophils Relative %: 68 %

## 2015-11-30 LAB — APTT: aPTT: 43 seconds — ABNORMAL HIGH (ref 24–36)

## 2015-11-30 LAB — COMPREHENSIVE METABOLIC PANEL
ALT: 20 U/L (ref 17–63)
ANION GAP: 5 (ref 5–15)
AST: 27 U/L (ref 15–41)
Albumin: 4.3 g/dL (ref 3.5–5.0)
Alkaline Phosphatase: 81 U/L (ref 38–126)
BUN: 16 mg/dL (ref 6–20)
CHLORIDE: 100 mmol/L — AB (ref 101–111)
CO2: 32 mmol/L (ref 22–32)
Calcium: 9 mg/dL (ref 8.9–10.3)
Creatinine, Ser: 1.14 mg/dL (ref 0.61–1.24)
GFR calc non Af Amer: >60 mL/min (ref >60)
Glucose, Bld: 106 mg/dL — ABNORMAL HIGH (ref 65–99)
Potassium: 3.7 mmol/L (ref 3.5–5.1)
SODIUM: 137 mmol/L (ref 135–145)
Total Bilirubin: 0.8 mg/dL (ref 0.3–1.2)
Total Protein: 7.5 g/dL (ref 6.5–8.1)

## 2015-11-30 LAB — TROPONIN I: Troponin I: <0.03 ng/mL (ref <0.03)

## 2015-11-30 LAB — CBG MONITORING, ED: GLUCOSE-CAPILLARY: 110 mg/dL — AB (ref 65–99)

## 2015-11-30 MED ORDER — VALACYCLOVIR HCL 1 G PO TABS: 1000.0000 mg | ORAL_TABLET | Freq: Three times a day (TID) | ORAL | 0 refills | Status: AC

## 2015-11-30 MED ORDER — PREDNISONE 20 MG PO TABS: 20.0000 mg | ORAL_TABLET | Freq: Every day | ORAL | 0 refills | Status: DC

## 2015-11-30 MED FILL — predniSONE 20 MG TABS: 20 | 5 days supply | Qty: 5 | Fill #0

## 2015-11-30 MED FILL — valACYclovir HCL 1 GM TABS: 1 | 7 days supply | Qty: 21 | Fill #0

## 2015-11-30 NOTE — ED Triage Notes (Addendum)
Pt with right sided facial drop. LKW 1400. According to family yesterday pt c/o behind left ear pain. A/O answers questions appropriately.   Addendum due to location of affected area.

## 2015-11-30 NOTE — ED Notes (Signed)
ED Provider at bedside. 

## 2015-11-30 NOTE — ED Notes (Signed)
Standing orders placed based on symptoms. EDP has been alerted about pt complaints.

## 2015-11-30 NOTE — ED Notes (Signed)
Patient and Family understands discharge instructions.

## 2015-11-30 NOTE — ED Notes (Signed)
Pt with facial changes. Neuro check WDL. CT to be cancelled per verbal from EDP. Bells Palsy.

## 2015-11-30 NOTE — ED Provider Notes (Signed)
[place] DEPT [facility] Provider Note   CSN: [id number] Arrival date & time: 11/30/15  1609     History   Chief Complaint Chief Complaint  Patient presents with  . Facial Droop    HPI [name] is a 75 y.o. male.  Patient is a 75 year old male with a history of ischemic cardiomyopathy, CHF, coronary artery disease and paroxysmal atrial fibrillation on Coumadin presenting today with right-sided facial weakness. Patient noticed yesterday some mild tingling behind his right ear and today strange sensation in the right side of his face and difficulty drinking from a straw. No current headache. He denies any numbness or weakness in upper or lower extremity. He has no neck pain. No recent illness or recent medication changes. No visual changes or speech difficulty. No chest pain or shortness of breath.   The history is provided by the patient and the spouse.    Past Medical History:  Diagnosis Date  . Acute on chronic systolic CHF (congestive heart failure) ([place]) 06/09/2015  . 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 Cardiomyopathy, ischemic 2011   with EF 25-35% by echo  . Coronary artery disease    Hx MI 1992, CABG 1998 , Nuc study 11.2013 large scar but no ischemia  . GERD (gastroesophageal reflux disease)   . H. pylori infection    Hx of   . 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 NSVT (nonsustained ventricular tachycardia) ([place])   . Paroxysmal atrial fibrillation [facility])     Patient Active Problem List   Diagnosis Date Noted  . LBBB (left bundle branch block) 11/24/2015  . Bloating 06/17/2015  . Pseudogout of knee   . NSVT (nonsustained ventricular tachycardia) ([place])   . Cholecystitis   . Hyperlipidemia LDL goal <70 07/08/2014  . Hemorrhoids, internal 05/24/2014  . Abdominal pain, chronic, epigastric 04/08/2014  . 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 Paroxysmal atrial fibrillation  ([place]) 02/11/2014  . Bradycardia 08/11/2012  . CABG '98. Low risk Myoview 11/13   . Long term current use of anticoagulant therapy 04/15/2012  . ICD (implantable cardioverter-defibrillator) in place   . SYSTOLIC HEART FAILURE, 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  . History of ventricular tachycardia 05/04/2008  . GERD 03/02/2008  . Asthma 10/09/2007  . Irritable bowel syndrome 10/09/2007  . Ischemic cardiomyopathy 05/13/2007    Past Surgical History:  Procedure Laterality Date  . CATARACT EXTRACTION, BILATERAL    . CORONARY ARTERY BYPASS GRAFT  1998   x 5  . HERNIA REPAIR  1960's   right inguinal  . ICD GENERATOR CHANGE  01/2008   medtronic, hx+ EP study  . KNEE SURGERY     right  . NASAL SINUS SURGERY  05/31/2010   Dr. Benjamine Mola       Home Medications    Prior to Admission medications   Medication Sig Start Date End Date Taking? Authorizing Provider  acetaminophen (TYLENOL) 650 MG CR tablet Take 650 mg by mouth 2 (two) times daily.     Historical Provider, MD  allopurinol (ZYLOPRIM) 300 MG tablet Take 300 mg by mouth daily.    Historical Provider, MD  aspirin 81 MG tablet Take 81 mg by mouth 3 (three) times a week. Monday, Wednesday, Friday    Historical Provider, MD  bisoprolol (ZEBETA) 5 MG tablet Take 0.5 tablets (2.5 mg total) by mouth daily. 10/04/15   Jolaine Artist, MD  cetirizine (ZYRTEC) 10 MG tablet  Take 10 mg by mouth daily.      Historical Provider, MD  Cholecalciferol (VITAMIN D) 2000 UNITS CAPS Take 1 capsule by mouth daily.    Historical Provider, MD  dicyclomine (BENTYL) 10 MG capsule Take 10 mg by mouth 2 (two) times daily as needed (IBS).     Historical Provider, MD  esomeprazole (NEXIUM) 40 MG capsule Take 1 capsule (40 mg total) by mouth daily at 12 noon. 07/13/15   [name] D Zehr, PA-C  glycopyrrolate (ROBINUL) 2 MG tablet Take 2 mg by mouth 2 (two) times daily as needed (stomach).    Historical Provider, MD  isosorbide mononitrate (IMDUR) 30 MG 24 hr tablet Take 1 tablet (30  mg total) by mouth daily. PLEASE CONTACT OFFICE FOR ADDITIONAL REFILLS 11/18/15   Troy Sine, MD  LORazepam (ATIVAN) 1 MG tablet Take 0.5 mg by mouth at bedtime.    Historical Provider, MD  losartan (COZAAR) 25 MG tablet Take 1 tablet (25 mg total) by mouth at bedtime. 11/24/15   Jolaine Artist, MD  magnesium oxide (MAG-OX) 400 MG tablet Take 400 mg by mouth daily.    Historical Provider, MD  metolazone (ZAROXOLYN) 2.5 MG tablet Take 1 tablet (2.5 mg total) by mouth daily as needed (if weight 135 lbs or more). Take 40 meq potassium if you take this pill. Patient not taking: Reported on 11/24/2015 07/25/15   Shaune Pascal Bensimhon, MD  mometasone [facility]) 220 MCG/INH inhaler Inhale 1 puff into the lungs daily.     Historical Provider, MD  montelukast (SINGULAIR) 10 MG tablet Take 1 tablet (10 mg total) by mouth at bedtime. PLEASE SCHEDULE APPOINTMENT. 02/01/15   Troy Sine, MD  nitroGLYCERIN (NITROSTAT) 0.4 MG SL tablet Place 1 tablet (0.4 mg total) under the tongue every 5 (five) minutes as needed for chest pain. Patient not taking: Reported on 11/24/2015 07/25/15   Jolaine Artist, MD  omeprazole (PRILOSEC) 20 MG capsule Take 1 capsule (20 mg total) by mouth 2 (two) times daily before a meal. 04/19/15   Manus Gunning, MD  predniSONE (DELTASONE) 20 MG tablet Take 1 tablet (20 mg total) by mouth daily. 11/30/15   Blanchie Dessert, MD  silodosin (RAPAFLO) 4 MG CAPS capsule Take 8 mg by mouth See admin instructions. Every 2 days    Historical Provider, MD  simvastatin (ZOCOR) 40 MG tablet TAKE ONE TABLET BY MOUTH ONCE DAILY AT BEDTIME 09/20/15   Troy Sine, MD  spironolactone (ALDACTONE) 25 MG tablet Take 1 tablet (25 mg total) by mouth daily. 07/15/15   Jolaine Artist, MD  torsemide (DEMADEX) 20 MG tablet Take 1 tablet (20 mg total) by mouth 2 (two) times daily. 07/25/15   Jolaine Artist, MD  valACYclovir (VALTREX) 1000 MG tablet Take 1 tablet (1,000 mg total) by mouth 3 (three)  times daily. 11/30/15 12/14/15  Blanchie Dessert, MD  warfarin (COUMADIN) 5 MG tablet TAKE ONE TABLET BY MOUTH ONCE DAILY OR AS DIRECTED BY COUMADIN CLINIC 08/18/15   Troy Sine, MD    Family History Family History  Problem Relation Age of Onset  . Heart disease Father     questionable  . Stroke Mother   . Heart failure Sister   . Healthy Brother   . Healthy Sister   . Parkinsonism Brother   . Colon cancer Neg Hx     Social History Social History  Substance Use Topics  . Smoking status: Never Smoker  . Smokeless tobacco: Never  Used  . Alcohol use 1.2 oz/week    2 Glasses of wine per week     Comment: socially     Allergies   Amoxicillin and Penicillins   Review of Systems Review of Systems  All other systems reviewed and are negative.    Physical Exam Updated Vital Signs BP 114/70   Pulse 60   Resp 17   Ht 5\' 7"  (1.702 m)   Wt 142 lb (64.4 kg)   SpO2 99%   BMI 22.24 kg/m   Physical Exam  Constitutional: He is oriented to person, place, and time. He appears well-developed and well-nourished. No distress.  HENT:  Head: Normocephalic and atraumatic.  Right Ear: Tympanic membrane normal.  Left Ear: Tympanic membrane normal.  Mouth/Throat: Oropharynx is clear and moist.  Eyes: Conjunctivae and EOM are normal. Pupils are equal, round, and reactive to light.  Neck: Normal range of motion. Neck supple.  Cardiovascular: Normal rate, regular rhythm and intact distal pulses.   No murmur heard. Pulmonary/Chest: Effort normal and breath sounds normal. No respiratory distress. He has no wheezes. He has no rales.  Defibrillator present in upper chest wall. Well-healed sternotomy scar.  Abdominal: Soft. He exhibits no distension. There is no tenderness. There is no rebound and no guarding.  Musculoskeletal: Normal range of motion. He exhibits no edema or tenderness.  Neurological: He is alert and oriented to person, place, and time. He has normal strength. A cranial  nerve deficit is present. No sensory deficit. Coordination and gait normal.  Right-sided facial droop involving the forehead and lower part of the face. Able to completely close his eyes. Asymmetric smile. Sensation intact. 5 out of 5 strength in upper and lower extremities. Coordination and gait are within normal limits.  Skin: Skin is warm and dry. No rash noted. No erythema.  Psychiatric: He has a normal mood and affect. His behavior is normal.  Nursing note and vitals reviewed.    ED Treatments / Results  Labs (all labs ordered are listed, but only abnormal results are displayed) Labs Reviewed  PROTIME-INR - Abnormal; Notable for the following:       Result Value   Prothrombin Time 24.4 (*)    All other components within normal limits  APTT - Abnormal; Notable for the following:    aPTT 43 (*)    All other components within normal limits  DIFFERENTIAL - Abnormal; Notable for the following:    Monocytes Absolute 1.1 (*)    All other components within normal limits  COMPREHENSIVE METABOLIC PANEL - Abnormal; Notable for the following:    Chloride 100 (*)    Glucose, Bld 106 (*)    All other components within normal limits  CBG MONITORING, ED - Abnormal; Notable for the following:    Glucose-Capillary 110 (*)    All other components within normal limits  CBC  TROPONIN I    EKG  EKG Interpretation  Date/Time:  Wednesday November 30 2015 16:18:35 EDT Ventricular Rate:  61 PR Interval:    QRS Duration: 156 QT Interval:  477 QTC Calculation: 481 R Axis:   -78 Text Interpretation:  Sinus rhythm Multiform ventricular premature complexes Probable left atrial enlargement Left bundle branch block No significant change since last tracing Confirmed by Maryan Rued  MD, Loree Fee ([id number]) on 11/30/2015 4:39:52 PM       Radiology No results found.  Procedures Procedures (including critical care time)  Medications Ordered in ED Medications - No data to display   Initial  Impression / Assessment and Plan / ED Course  I have reviewed the triage vital signs and the nursing notes.  Pertinent labs & imaging results that were available during my care of the patient were reviewed by me and considered in my medical decision making (see chart for details).  Clinical Course    Patient is a 75 year old male presenting today with right-sided facial droop most consistent with Bell's palsy. He has 4 head involvement and no other symptoms. Neuro exam other than right-sided facial droop is normal. No signs of stroke at this time. Patient was treated with Valtrex and prednisone. Patient's renal function is GFR greater than 6070 was given 1000 mg 3 times a day. Discussed with him remaining hydrated. Also to follow-up with his doctor in one week for repeat creatinine check and follow-up.  Final Clinical Impressions(s) / ED Diagnoses   Final diagnoses:  Bell's palsy    New Prescriptions New Prescriptions   PREDNISONE (DELTASONE) 20 MG TABLET    Take 1 tablet (20 mg total) by mouth daily.   VALACYCLOVIR (VALTREX) 1000 MG TABLET    Take 1 tablet (1,000 mg total) by mouth 3 (three) times daily.     Blanchie Dessert, MD 11/30/15 1705

## 2015-12-01 ENCOUNTER — Telehealth (HOSPITAL_COMMUNITY): Payer: Self-pay

## 2015-12-01 NOTE — Telephone Encounter (Signed)
Received call to CHF clinic triage line regarding recent new diagnosis of Bell's Palsy and concern for interaction of 2 new meds with his current med regimen. After revieweing new meds with CHF clinic pharmacist Erika (prednisoe and valtrex), left return VM that these medications were ok to take with current med regimen. Also stated that he is ok to keep current f/u apt in December, but to call our office if he gains fluid/swelling as this is a probability with prednisone use.  Renee Pain, RN

## 2015-12-08 DIAGNOSIS — F5101 Primary insomnia: Secondary | ICD-10-CM | POA: Diagnosis not present

## 2015-12-08 DIAGNOSIS — G510 Bell's palsy: Secondary | ICD-10-CM | POA: Diagnosis not present

## 2015-12-08 DIAGNOSIS — Z23 Encounter for immunization: Secondary | ICD-10-CM | POA: Diagnosis not present

## 2015-12-08 DIAGNOSIS — I5023 Acute on chronic systolic (congestive) heart failure: Secondary | ICD-10-CM | POA: Diagnosis not present

## 2015-12-12 ENCOUNTER — Ambulatory Visit (INDEPENDENT_AMBULATORY_CARE_PROVIDER_SITE_OTHER): Payer: Medicare Other | Admitting: Pharmacist Clinician (PhC)/ Clinical Pharmacy Specialist

## 2015-12-12 DIAGNOSIS — I480 Paroxysmal atrial fibrillation: Secondary | ICD-10-CM | POA: Diagnosis not present

## 2015-12-12 DIAGNOSIS — Z7901 Long term (current) use of anticoagulants: Secondary | ICD-10-CM | POA: Diagnosis not present

## 2015-12-12 DIAGNOSIS — I4891 Unspecified atrial fibrillation: Secondary | ICD-10-CM

## 2015-12-12 DIAGNOSIS — I255 Ischemic cardiomyopathy: Secondary | ICD-10-CM

## 2015-12-12 LAB — POCT INR: INR: 2.5

## 2015-12-13 DIAGNOSIS — R972 Elevated prostate specific antigen [PSA]: Secondary | ICD-10-CM | POA: Diagnosis not present

## 2015-12-16 ENCOUNTER — Other Ambulatory Visit: Payer: Self-pay | Admitting: Cardiovascular Disease

## 2015-12-20 DIAGNOSIS — R972 Elevated prostate specific antigen [PSA]: Secondary | ICD-10-CM | POA: Diagnosis not present

## 2015-12-20 DIAGNOSIS — N400 Enlarged prostate without lower urinary tract symptoms: Secondary | ICD-10-CM | POA: Diagnosis not present

## 2015-12-28 ENCOUNTER — Ambulatory Visit (INDEPENDENT_AMBULATORY_CARE_PROVIDER_SITE_OTHER): Payer: Medicare Other

## 2015-12-28 ENCOUNTER — Telehealth: Payer: Self-pay

## 2015-12-28 DIAGNOSIS — Z95810 Presence of automatic (implantable) cardiac defibrillator: Secondary | ICD-10-CM | POA: Diagnosis not present

## 2015-12-28 DIAGNOSIS — I5022 Chronic systolic (congestive) heart failure: Secondary | ICD-10-CM

## 2015-12-28 NOTE — Telephone Encounter (Signed)
[name] is wanting to speak to someone in the device clinic about his transmission . Please call   Thanks

## 2015-12-29 DIAGNOSIS — M109 Gout, unspecified: Secondary | ICD-10-CM | POA: Diagnosis not present

## 2015-12-29 DIAGNOSIS — M25562 Pain in left knee: Secondary | ICD-10-CM | POA: Diagnosis not present

## 2015-12-29 DIAGNOSIS — Z79899 Other long term (current) drug therapy: Secondary | ICD-10-CM | POA: Diagnosis not present

## 2015-12-29 DIAGNOSIS — M25561 Pain in right knee: Secondary | ICD-10-CM | POA: Diagnosis not present

## 2015-12-29 NOTE — Telephone Encounter (Signed)
Spoke with patient.

## 2015-12-29 NOTE — Progress Notes (Signed)
EPIC Encounter for ICM Monitoring  Patient Name: [name] is a 75 y.o. male Date: 12/29/2015 Primary Care Physican: [name], MD Primary Cardiologist:Kelly/Bensimhon Electrophysiologist: [name] Dry Weight:136 lbs            Heart Failure questions reviewed, pt asymptomatic   Thoracic impedance normal   Recommendations: No changes.  Reinforced low salt food choices and limiting fluid intake to < 2 liters per day. Encouraged to call for fluid symptoms.    Follow-up plan: ICM clinic phone appointment on 01/31/2016.  Copy of ICM check sent to device physician.   ICM trend: 12/28/2015       [name], RN 12/29/2015 8:55 AM

## 2016-01-09 ENCOUNTER — Ambulatory Visit (INDEPENDENT_AMBULATORY_CARE_PROVIDER_SITE_OTHER): Payer: Medicare Other | Admitting: Pharmacist Clinician (PhC)/ Clinical Pharmacy Specialist

## 2016-01-09 DIAGNOSIS — I255 Ischemic cardiomyopathy: Secondary | ICD-10-CM

## 2016-01-09 DIAGNOSIS — I4891 Unspecified atrial fibrillation: Secondary | ICD-10-CM | POA: Diagnosis not present

## 2016-01-09 DIAGNOSIS — I480 Paroxysmal atrial fibrillation: Secondary | ICD-10-CM

## 2016-01-09 DIAGNOSIS — Z7901 Long term (current) use of anticoagulants: Secondary | ICD-10-CM

## 2016-01-09 LAB — POCT INR: INR: 2

## 2016-01-16 DIAGNOSIS — J029 Acute pharyngitis, unspecified: Secondary | ICD-10-CM | POA: Diagnosis not present

## 2016-01-19 ENCOUNTER — Encounter (HOSPITAL_COMMUNITY): Payer: Self-pay | Admitting: Internal Medicine

## 2016-01-19 ENCOUNTER — Ambulatory Visit (HOSPITAL_COMMUNITY)
Admission: RE | Admit: 2016-01-19 | Discharge: 2016-01-19 | Disposition: A | Discharge status: Home / Self Care | Payer: Medicare Other | Source: Ambulatory Visit | Attending: Family Medicine | Admitting: Family Medicine

## 2016-01-19 ENCOUNTER — Ambulatory Visit (HOSPITAL_BASED_OUTPATIENT_CLINIC_OR_DEPARTMENT_OTHER)
Admission: RE | Admit: 2016-01-19 | Discharge: 2016-01-19 | Disposition: A | Discharge status: Home / Self Care | Payer: Medicare Other | Source: Ambulatory Visit | Attending: Internal Medicine | Admitting: Internal Medicine

## 2016-01-19 ENCOUNTER — Telehealth: Payer: Self-pay

## 2016-01-19 VITALS — BP 98/62 | HR 61 | Wt 141.8 lb

## 2016-01-19 DIAGNOSIS — G510 Bell's palsy: Secondary | ICD-10-CM | POA: Diagnosis not present

## 2016-01-19 DIAGNOSIS — Z7901 Long term (current) use of anticoagulants: Secondary | ICD-10-CM | POA: Diagnosis not present

## 2016-01-19 DIAGNOSIS — I480 Paroxysmal atrial fibrillation: Secondary | ICD-10-CM | POA: Diagnosis not present

## 2016-01-19 DIAGNOSIS — I447 Left bundle-branch block, unspecified: Secondary | ICD-10-CM | POA: Diagnosis not present

## 2016-01-19 DIAGNOSIS — I5022 Chronic systolic (congestive) heart failure: Secondary | ICD-10-CM | POA: Insufficient documentation

## 2016-01-19 DIAGNOSIS — Z95810 Presence of automatic (implantable) cardiac defibrillator: Secondary | ICD-10-CM

## 2016-01-19 DIAGNOSIS — K219 Gastro-esophageal reflux disease without esophagitis: Secondary | ICD-10-CM | POA: Diagnosis not present

## 2016-01-19 DIAGNOSIS — Z8249 Family history of ischemic heart disease and other diseases of the circulatory system: Secondary | ICD-10-CM | POA: Diagnosis not present

## 2016-01-19 DIAGNOSIS — Z7982 Long term (current) use of aspirin: Secondary | ICD-10-CM | POA: Insufficient documentation

## 2016-01-19 DIAGNOSIS — I2510 Atherosclerotic heart disease of native coronary artery without angina pectoris: Secondary | ICD-10-CM

## 2016-01-19 DIAGNOSIS — H43811 Vitreous degeneration, right eye: Secondary | ICD-10-CM | POA: Diagnosis not present

## 2016-01-19 DIAGNOSIS — Z79899 Other long term (current) drug therapy: Secondary | ICD-10-CM | POA: Diagnosis not present

## 2016-01-19 DIAGNOSIS — Z880 Allergy status to penicillin: Secondary | ICD-10-CM | POA: Insufficient documentation

## 2016-01-19 DIAGNOSIS — I2583 Coronary atherosclerosis due to lipid rich plaque: Secondary | ICD-10-CM | POA: Diagnosis not present

## 2016-01-19 DIAGNOSIS — Z823 Family history of stroke: Secondary | ICD-10-CM | POA: Diagnosis not present

## 2016-01-19 DIAGNOSIS — I255 Ischemic cardiomyopathy: Secondary | ICD-10-CM | POA: Diagnosis not present

## 2016-01-19 DIAGNOSIS — Z951 Presence of aortocoronary bypass graft: Secondary | ICD-10-CM | POA: Insufficient documentation

## 2016-01-19 DIAGNOSIS — Z800 Family history of malignant neoplasm of digestive organs: Secondary | ICD-10-CM | POA: Diagnosis not present

## 2016-01-19 DIAGNOSIS — Z961 Presence of intraocular lens: Secondary | ICD-10-CM | POA: Diagnosis not present

## 2016-01-19 DIAGNOSIS — I252 Old myocardial infarction: Secondary | ICD-10-CM | POA: Diagnosis not present

## 2016-01-19 DIAGNOSIS — H469 Unspecified optic neuritis: Secondary | ICD-10-CM | POA: Diagnosis not present

## 2016-01-19 DIAGNOSIS — H353132 Nonexudative age-related macular degeneration, bilateral, intermediate dry stage: Secondary | ICD-10-CM | POA: Diagnosis not present

## 2016-01-19 DIAGNOSIS — J029 Acute pharyngitis, unspecified: Secondary | ICD-10-CM | POA: Diagnosis not present

## 2016-01-19 MED ORDER — BISOPROLOL FUMARATE 5 MG PO TABS: 2.5000 mg | ORAL_TABLET | Freq: Every day | ORAL | 3 refills | Status: DC

## 2016-01-19 MED ORDER — LOSARTAN POTASSIUM 25 MG PO TABS: 25.0000 mg | ORAL_TABLET | Freq: Every day | ORAL | 3 refills | Status: DC

## 2016-01-19 MED ORDER — TORSEMIDE 20 MG PO TABS: 20.0000 mg | ORAL_TABLET | Freq: Two times a day (BID) | ORAL | 3 refills | Status: DC

## 2016-01-19 MED ORDER — SPIRONOLACTONE 25 MG PO TABS: 25.0000 mg | ORAL_TABLET | Freq: Every day | ORAL | 3 refills | Status: DC

## 2016-01-19 MED ORDER — ISOSORBIDE MONONITRATE ER 30 MG PO TB24: 30.0000 mg | ORAL_TABLET | Freq: Every day | ORAL | 3 refills | Status: DC

## 2016-01-19 NOTE — Patient Instructions (Signed)
Your provider requests you have a Cardiopulmonary Exercise test (CPX)  Follow up with Dr.Bensimhon in 2 months

## 2016-01-19 NOTE — Progress Notes (Signed)
  Echocardiogram 2D Echocardiogram has been performed.  Darlina Sicilian M 01/19/2016, 2:59 PM

## 2016-01-19 NOTE — Progress Notes (Signed)
Patient ID: [name], male   DOB: 1940-10-06, 75 y.o.   MRN: [id number]    Advanced Heart Failure Clinic Note   Date:  01/19/2016   ID:  [name], [name] 05-13-1940, MRN [id number]  PCP:  Jason Blamer, MD  Primary Cardiologist:  Tresa Endo Referring: Dr. [name] Kill   History of Present Illness: [name] is a 75 y.o. 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 In September 1998 he underwent CABG revascularization surgery after an unsuccessful attempt at stenting of his proximal LAD by Dr. Juanda Chance. Ejection fraction was 25-30%. In 2002 he underwent initial ICD implantation for nonsustained ventricular tachycardia documented on event monitor for primary prevention. In January 2010 he underwent generator change with a Medtronic Virtuoso single chamber cardioverter defibrillator.  A nuclear perfusion study in November 2013 showed a large area of scar in the LAD territory (extent 44%) involving the mid to apical anterior,apical and infero-apical to mid infero-septal and apical lateral wall without associated ischemia.  He is on Coumadin anticoagulation for PAF. He was readmitted to the hospital from a May 2 through 06/04/2014 with recurrent atrial fibrillation and started on Tikosyn. He was enrolled in the genetic AF trial (bucindolol vs Toprol). He did poorly in the trial in the setting of titration of beta-blocker. He was referred to HF Clinic. Genetic AF study completed for pt. He is now on bisoprol 2.5 mg daily.   He has been discussing CRT upgrade with Dr. Graciela Husbands and has intermediate predictors of benefit (LBBB with QRS almost 150 ms, but ischemic cardiomyopathy and male gender).    He presents today for regular follow up.  Overall feeling good and close to "old self". Having sore throat for the last few days. Also had a bout of Bell's Palsy since his last visit.  Weight at home  136-137. Sleeps on 2 pillows chronically.  Exercising most days.  Wears a Education officer, museum, gets > 5000 steps daily, Occasionally hits 64403 steps. Denies lightheadedness. No bleeding on coumadin.   Echo today shows EF 15-20%, Moderate MR, RV with reduced function  Echo 5/17 EF 20% RV mildly down.   Optivol interrogation today shows fluid index well below threshold. Thoracic impedence stable. Pt activity 2 hrs a day. No AT/VT/VF.  Labs 10/04/15 K 5.3, Creatinine 0.85, BUN 15  Past Medical History:  Diagnosis Date  . Acute on chronic systolic CHF (congestive heart failure) ([facility]) 06/09/2015  . 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 Cardiomyopathy, ischemic 2011   with EF 25-35% by echo  . Coronary artery disease    Hx MI 1992, CABG 1998 , Nuc study 11.2013 large scar but no ischemia  . GERD (gastroesophageal reflux disease)   . H. pylori infection    Hx of   . 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 NSVT (nonsustained ventricular tachycardia) ([facility])   . Paroxysmal atrial fibrillation [facility])     Current Outpatient Prescriptions  Medication Sig Dispense Refill  . acetaminophen (TYLENOL) 650 MG CR tablet Take 650 mg by mouth 2 (two) times daily.     Marland Kitchen allopurinol (ZYLOPRIM) 300 MG tablet Take 300 mg by mouth daily.    Marland Kitchen aspirin 81 MG tablet Take 81 mg by mouth 3 (three) times a week. Monday, Wednesday, Friday    . bisoprolol (ZEBETA)  5 MG tablet Take 0.5 tablets (2.5 mg total) by mouth daily. 15 tablet 3  . cetirizine (ZYRTEC) 10 MG tablet Take 10 mg by mouth daily.      . Cholecalciferol (VITAMIN D) 2000 UNITS CAPS Take 1 capsule by mouth daily.    Marland Kitchen dicyclomine (BENTYL) 10 MG capsule Take 10 mg by mouth 2 (two) times daily as needed (IBS).     Marland Kitchen esomeprazole (NEXIUM) 40 MG capsule Take 1 capsule (40 mg total) by mouth daily at 12 noon. 30 capsule 3  . glycopyrrolate (ROBINUL) 2 MG tablet Take 2 mg by mouth 2  (two) times daily as needed (stomach).    . isosorbide mononitrate (IMDUR) 30 MG 24 hr tablet Take 1 tablet (30 mg total) by mouth daily. PLEASE CONTACT OFFICE FOR ADDITIONAL REFILLS 30 tablet 0  . LORazepam (ATIVAN) 1 MG tablet Take 0.5 mg by mouth at bedtime.    Marland Kitchen losartan (COZAAR) 25 MG tablet Take 1 tablet (25 mg total) by mouth at bedtime. 90 tablet 3  . magnesium oxide (MAG-OX) 400 MG tablet Take 400 mg by mouth daily.    . mometasone (ASMANEX) 220 MCG/INH inhaler Inhale 1 puff into the lungs daily.     . montelukast (SINGULAIR) 10 MG tablet Take 1 tablet (10 mg total) by mouth at bedtime. PLEASE SCHEDULE APPOINTMENT. 30 tablet 0  . omeprazole (PRILOSEC) 20 MG capsule Take 1 capsule (20 mg total) by mouth 2 (two) times daily before a meal. 180 capsule 3  . predniSONE (DELTASONE) 20 MG tablet Take 1 tablet (20 mg total) by mouth daily. 5 tablet 0  . silodosin (RAPAFLO) 4 MG CAPS capsule Take 8 mg by mouth See admin instructions. Every 2 days    . simvastatin (ZOCOR) 40 MG tablet TAKE ONE TABLET BY MOUTH ONCE DAILY AT BEDTIME 90 tablet 2  . spironolactone (ALDACTONE) 25 MG tablet Take 1 tablet (25 mg total) by mouth daily. 30 tablet 11  . torsemide (DEMADEX) 20 MG tablet Take 1 tablet (20 mg total) by mouth 2 (two) times daily. 60 tablet 6  . warfarin (COUMADIN) 5 MG tablet TAKE ONE TABLET BY MOUTH ONCE DAILY OR AS DIRECTED BY  COUMADIN  CLINIC 90 tablet 1  . metolazone (ZAROXOLYN) 2.5 MG tablet Take 1 tablet (2.5 mg total) by mouth daily as needed (if weight 135 lbs or more). Take 40 meq potassium if you take this pill. (Patient not taking: Reported on 01/19/2016) 2 tablet 0  . nitroGLYCERIN (NITROSTAT) 0.4 MG SL tablet Place 1 tablet (0.4 mg total) under the tongue every 5 (five) minutes as needed for chest pain. (Patient not taking: Reported on 01/19/2016) 25 tablet 1   No current facility-administered medications for this encounter.     Allergies:    Allergies  Allergen Reactions  .  Amoxicillin Rash  . Penicillins Rash    Social History:  The patient  reports that he has never smoked. He has never used smokeless tobacco. He reports that he drinks about 1.2 oz of alcohol per week . He reports that he does not use drugs.   Family history:   Family History  Problem Relation Age of Onset  . Heart disease Father     questionable  . Stroke Mother   . Heart failure Sister   . Healthy Brother   . Healthy Sister   . Parkinsonism Brother   . Colon cancer Neg Hx    ROS:  Please see history of present illness.  All other systems reviewed and negative.    PHYSICAL EXAM: VS:  BP 98/62   Pulse 61   Wt 141 lb 12 oz (64.3 kg)   SpO2 99%   BMI 22.20 kg/m    Wt Readings from Last 3 Encounters:  01/19/16 141 lb 12 oz (64.3 kg)  11/30/15 142 lb (64.4 kg)  11/24/15 142 lb 8 oz (64.6 kg)   General: Elderly appearing. Well appearing.  HEENT: Normal Neck: JVP 6-7 cm. No thyromegaly or nodule noted.  Cardiac: Normal S1/S2 with ectopic beats. + s3  Lungs:  Clear, normal effort.  Abd: soft, NT, ND, no HSM. No bruits or masses. +BS  Ext: No peripheral edema. 2+ radial and dorsalis pedis pulses. Skin: Warm and dry  Neuro: Grossly normal. Moves all 4 extremities    ASSESSMENT AND PLAN: 1. 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 PAF - Remains in NSR --AC with warfarin --Completed Genetic AF trial 3. CAD s/p previous anterior infarct --stable no ischemia.  4. LBBB  Volume status stable on exam and optivol. Continue torsemide 20 mg BID with metolazone as needed.  EF remains depressed on Echo at 15-20%.  Needs CPX to quantify cardiac reserve.   Continue bisoprolol 2.5 mg daily, losartan 25 mg qhs, imdur 30 mg daily, and spironolactone 25 mg daily.  Graciella Freer, PA-C 3:48 PM   Patient seen and examined with Otilio Saber, PA-C. We discussed all aspects of the encounter. I agree with the assessment and plan as stated above.   Echo reviewed  personally today in clinic. ICD interrogated. Overall doing very well despite persistent severe LV dysfunction. Volume status looks good. BP too soft to tolerate further medication titration. Will plan CPX for further risk stratification. May need LVAD down the road.   Jason Scharnhorst,MD 9:47 PM

## 2016-01-19 NOTE — Telephone Encounter (Signed)
Patient called today regarding transmission.  Advised next ICM remote transmission is due on 01/31/2016.  He stated he sent a transmission on 01/17/2016.  Reviewed transmission and no fluid accumulation.  He stated he is feeling fine.

## 2016-01-31 ENCOUNTER — Ambulatory Visit (INDEPENDENT_AMBULATORY_CARE_PROVIDER_SITE_OTHER): Payer: Medicare Other | Admitting: *Deleted

## 2016-01-31 DIAGNOSIS — Z95810 Presence of automatic (implantable) cardiac defibrillator: Secondary | ICD-10-CM | POA: Diagnosis not present

## 2016-01-31 DIAGNOSIS — I5022 Chronic systolic (congestive) heart failure: Secondary | ICD-10-CM | POA: Diagnosis not present

## 2016-01-31 DIAGNOSIS — I255 Ischemic cardiomyopathy: Secondary | ICD-10-CM

## 2016-01-31 NOTE — Progress Notes (Signed)
EPIC Encounter for ICM Monitoring  Patient Name: [name] is a 76 y.o. male Date: 01/31/2016 Primary Care Physican: [name], MD Primary Cardiologist:Kelly/Bensimhon Electrophysiologist: [name] Dry Weight:135 lbs         Heart Failure questions reviewed, pt asymptomatic   Thoracic impedance normal   Recommendations: No changes.  Reinforced to limit low salt food choices to 2000 mg day and limiting fluid intake to < 2 liters per day. Encouraged to call for fluid symptoms.    Follow-up plan: ICM clinic phone appointment on 03/02/2016.  Copy of ICM check sent to device physician.   3 month ICM trend : 01/31/2016   1 Year ICM trend:      [name], RN 01/31/2016 11:55 AM

## 2016-02-02 NOTE — Progress Notes (Signed)
Remote ICD transmission.   

## 2016-02-03 ENCOUNTER — Encounter: Payer: Self-pay | Admitting: Cardiology

## 2016-02-03 LAB — CUP PACEART REMOTE DEVICE CHECK
Battery Voltage: 2.64 V
Brady Statistic RV Percent Paced: 0.04 %
Date Time Interrogation Session: 20180102093827
HIGH POWER IMPEDANCE MEASURED VALUE: 50 Ohm
HighPow Impedance: 77 Ohm
Implantable Lead Location: 753860
Implantable Lead Model: 147
Implantable Lead Serial Number: 104581
Lead Channel Impedance Value: 984 Ohm
Lead Channel Setting Sensing Sensitivity: 0.3 mV
MDC IDC LEAD IMPLANT DT: 20010806
MDC IDC MSMT LEADCHNL RV SENSING INTR AMPL: 15.974 mV
MDC IDC PG IMPLANT DT: 20100111
MDC IDC SET LEADCHNL RV PACING AMPLITUDE: 2.5 V
MDC IDC SET LEADCHNL RV PACING PULSEWIDTH: 0.7 ms

## 2016-02-07 ENCOUNTER — Ambulatory Visit (INDEPENDENT_AMBULATORY_CARE_PROVIDER_SITE_OTHER): Payer: Medicare Other | Admitting: Pharmacist Clinician (PhC)/ Clinical Pharmacy Specialist

## 2016-02-07 DIAGNOSIS — Z7901 Long term (current) use of anticoagulants: Secondary | ICD-10-CM | POA: Diagnosis not present

## 2016-02-07 DIAGNOSIS — I4891 Unspecified atrial fibrillation: Secondary | ICD-10-CM

## 2016-02-07 DIAGNOSIS — I480 Paroxysmal atrial fibrillation: Secondary | ICD-10-CM | POA: Diagnosis not present

## 2016-02-07 DIAGNOSIS — I255 Ischemic cardiomyopathy: Secondary | ICD-10-CM

## 2016-02-07 LAB — POCT INR: INR: 2.1

## 2016-02-09 DIAGNOSIS — E7800 Pure hypercholesterolemia, unspecified: Secondary | ICD-10-CM | POA: Diagnosis not present

## 2016-02-09 DIAGNOSIS — I480 Paroxysmal atrial fibrillation: Secondary | ICD-10-CM | POA: Diagnosis not present

## 2016-02-09 DIAGNOSIS — F419 Anxiety disorder, unspecified: Secondary | ICD-10-CM | POA: Diagnosis not present

## 2016-02-09 DIAGNOSIS — I10 Essential (primary) hypertension: Secondary | ICD-10-CM | POA: Diagnosis not present

## 2016-02-09 DIAGNOSIS — J45909 Unspecified asthma, uncomplicated: Secondary | ICD-10-CM | POA: Diagnosis not present

## 2016-02-13 ENCOUNTER — Encounter: Payer: Self-pay | Admitting: Gastroenterology

## 2016-02-17 ENCOUNTER — Encounter (HOSPITAL_COMMUNITY): Payer: Medicare Other

## 2016-02-17 ENCOUNTER — Other Ambulatory Visit (HOSPITAL_COMMUNITY): Payer: Self-pay | Admitting: *Deleted

## 2016-02-17 ENCOUNTER — Ambulatory Visit (HOSPITAL_COMMUNITY): Payer: Medicare Other | Attending: Cardiology

## 2016-02-17 DIAGNOSIS — I5022 Chronic systolic (congestive) heart failure: Secondary | ICD-10-CM | POA: Diagnosis not present

## 2016-02-22 ENCOUNTER — Encounter: Payer: Self-pay | Admitting: Cardiology

## 2016-03-02 ENCOUNTER — Ambulatory Visit (INDEPENDENT_AMBULATORY_CARE_PROVIDER_SITE_OTHER): Payer: Medicare Other

## 2016-03-02 ENCOUNTER — Telehealth: Payer: Self-pay

## 2016-03-02 DIAGNOSIS — I5022 Chronic systolic (congestive) heart failure: Secondary | ICD-10-CM | POA: Diagnosis not present

## 2016-03-02 DIAGNOSIS — Z95810 Presence of automatic (implantable) cardiac defibrillator: Secondary | ICD-10-CM | POA: Diagnosis not present

## 2016-03-02 NOTE — Telephone Encounter (Signed)
Remote ICM transmission received.  Attempted patient call and left message to return call.   

## 2016-03-02 NOTE — Progress Notes (Signed)
EPIC Encounter for ICM Monitoring  Patient Name: [name] is a 76 y.o. male Date: 03/02/2016 Primary Care Physican: [name], MD Primary Cardiologist:Kelly/Bensimhon Electrophysiologist: Faustino Congress Weight:unknown      Attempted call to patient and unable to reach.  Left message to return call.  Transmission reviewed.   Thoracic impedance normal   Recommendations: NONE - Unable to reach patient   Follow-up plan: ICM clinic phone appointment on 04/26/2016 since patient has office appointment scheduled with Dr [name] 03/26/2016.  Copy of ICM check sent to device physician.   3 month ICM trend: 03/02/2016   1 Year ICM trend:      [name], RN 03/02/2016 8:15 AM

## 2016-03-02 NOTE — Progress Notes (Signed)
Patient returned call.  He stated he is doing well and feeling fine.  Denied any fluid symptoms.  No changes today.  Encouraged to call for any fluid symptoms.

## 2016-03-07 ENCOUNTER — Ambulatory Visit (INDEPENDENT_AMBULATORY_CARE_PROVIDER_SITE_OTHER): Payer: Medicare Other | Admitting: Pharmacist

## 2016-03-07 DIAGNOSIS — I255 Ischemic cardiomyopathy: Secondary | ICD-10-CM | POA: Diagnosis not present

## 2016-03-07 DIAGNOSIS — Z7901 Long term (current) use of anticoagulants: Secondary | ICD-10-CM | POA: Diagnosis not present

## 2016-03-07 DIAGNOSIS — I4891 Unspecified atrial fibrillation: Secondary | ICD-10-CM | POA: Diagnosis not present

## 2016-03-07 DIAGNOSIS — I480 Paroxysmal atrial fibrillation: Secondary | ICD-10-CM | POA: Diagnosis not present

## 2016-03-07 LAB — POCT INR: INR: 2

## 2016-03-07 NOTE — Patient Instructions (Signed)
Blood pressure 98/50 in office

## 2016-03-19 ENCOUNTER — Encounter (HOSPITAL_BASED_OUTPATIENT_CLINIC_OR_DEPARTMENT_OTHER): Payer: Self-pay | Admitting: *Deleted

## 2016-03-19 ENCOUNTER — Emergency Department (HOSPITAL_BASED_OUTPATIENT_CLINIC_OR_DEPARTMENT_OTHER): Payer: Medicare Other

## 2016-03-19 ENCOUNTER — Emergency Department (HOSPITAL_BASED_OUTPATIENT_CLINIC_OR_DEPARTMENT_OTHER)
Admission: EM | Admit: 2016-03-19 | Discharge: 2016-03-19 | Disposition: A | Discharge status: Home / Self Care | Payer: Medicare Other | Attending: Emergency Medicine | Admitting: Emergency Medicine

## 2016-03-19 ENCOUNTER — Telehealth (HOSPITAL_COMMUNITY): Payer: Self-pay | Admitting: *Deleted

## 2016-03-19 DIAGNOSIS — J301 Allergic rhinitis due to pollen: Secondary | ICD-10-CM | POA: Diagnosis not present

## 2016-03-19 DIAGNOSIS — J069 Acute upper respiratory infection, unspecified: Secondary | ICD-10-CM | POA: Insufficient documentation

## 2016-03-19 DIAGNOSIS — Z79899 Other long term (current) drug therapy: Secondary | ICD-10-CM | POA: Insufficient documentation

## 2016-03-19 DIAGNOSIS — Z955 Presence of coronary angioplasty implant and graft: Secondary | ICD-10-CM | POA: Diagnosis not present

## 2016-03-19 DIAGNOSIS — I5023 Acute on chronic systolic (congestive) heart failure: Secondary | ICD-10-CM | POA: Insufficient documentation

## 2016-03-19 DIAGNOSIS — I2510 Atherosclerotic heart disease of native coronary artery without angina pectoris: Secondary | ICD-10-CM | POA: Insufficient documentation

## 2016-03-19 DIAGNOSIS — Z7982 Long term (current) use of aspirin: Secondary | ICD-10-CM | POA: Insufficient documentation

## 2016-03-19 DIAGNOSIS — R509 Fever, unspecified: Secondary | ICD-10-CM | POA: Diagnosis not present

## 2016-03-19 DIAGNOSIS — J4540 Moderate persistent asthma, uncomplicated: Secondary | ICD-10-CM | POA: Diagnosis not present

## 2016-03-19 DIAGNOSIS — Z7901 Long term (current) use of anticoagulants: Secondary | ICD-10-CM | POA: Diagnosis not present

## 2016-03-19 DIAGNOSIS — J3081 Allergic rhinitis due to animal (cat) (dog) hair and dander: Secondary | ICD-10-CM | POA: Diagnosis not present

## 2016-03-19 DIAGNOSIS — J3089 Other allergic rhinitis: Secondary | ICD-10-CM | POA: Diagnosis not present

## 2016-03-19 DIAGNOSIS — R05 Cough: Secondary | ICD-10-CM | POA: Diagnosis not present

## 2016-03-19 LAB — CBC
HCT: 40.3 % (ref 39.0–52.0)
HEMOGLOBIN: 13.4 g/dL (ref 13.0–17.0)
MCH: 29.2 pg (ref 26.0–34.0)
MCHC: 33.3 g/dL (ref 30.0–36.0)
MCV: 87.8 fL (ref 78.0–100.0)
Platelets: 166 10*3/uL (ref 150–400)
RBC: 4.59 MIL/uL (ref 4.22–5.81)
RDW: 14.5 % (ref 11.5–15.5)
WBC: 7.9 10*3/uL (ref 4.0–10.5)

## 2016-03-19 LAB — URINALYSIS, ROUTINE W REFLEX MICROSCOPIC
Bilirubin Urine: NEGATIVE
Glucose, UA: NEGATIVE mg/dL
Hgb urine dipstick: NEGATIVE
KETONES UR: NEGATIVE mg/dL
LEUKOCYTES UA: NEGATIVE
NITRITE: NEGATIVE
PH: 7 (ref 5.0–8.0)
Protein, ur: NEGATIVE mg/dL
Specific Gravity, Urine: 1.007 (ref 1.005–1.030)

## 2016-03-19 LAB — BASIC METABOLIC PANEL
Anion gap: 7 (ref 5–15)
BUN: 16 mg/dL (ref 6–20)
CALCIUM: 8.6 mg/dL — AB (ref 8.9–10.3)
CO2: 31 mmol/L (ref 22–32)
CREATININE: 1.13 mg/dL (ref 0.61–1.24)
Chloride: 97 mmol/L — ABNORMAL LOW (ref 101–111)
GFR calc non Af Amer: >60 mL/min (ref >60)
Glucose, Bld: 84 mg/dL (ref 65–99)
Potassium: 3.4 mmol/L — ABNORMAL LOW (ref 3.5–5.1)
SODIUM: 135 mmol/L (ref 135–145)

## 2016-03-19 LAB — I-STAT CG4 LACTIC ACID, ED: Lactic Acid, Venous: 1.1 mmol/L (ref 0.5–1.9)

## 2016-03-19 MED ORDER — OSELTAMIVIR PHOSPHATE 75 MG PO CAPS: 75.0000 mg | ORAL_CAPSULE | Freq: Two times a day (BID) | ORAL | 0 refills | Status: DC

## 2016-03-19 NOTE — ED Triage Notes (Signed)
Pt reports fever, URI.  Reports going to PCP this morning and was told to come to the ED due to the fever.

## 2016-03-19 NOTE — ED Notes (Addendum)
Tylenol 650 PTA @ 1500

## 2016-03-19 NOTE — ED Provider Notes (Signed)
[place] DEPT [facility] Provider Note   CSN: [id number] Arrival date & time: 03/19/16  1647  By signing my name below, I, Arianna Nassar, attest that this documentation has been prepared under the direction and in the presence of Margarita Mail, PA-C.  Electronically Signed: Julien Nordmann, ED Scribe. 03/19/16. 5:58 PM.    History   Chief Complaint Chief Complaint  Patient presents with  . URI   The history is provided by the patient. No language interpreter was used.   HPI Comments: [name] is a 76 y.o. male who has a PMhx of systolic CHF, ischemic cardiomyopathy with EG 25-35% in 2010, CAD, CABG x5, MI, a-fib, and H. Pylori presents to the Emergency Department complaining of persisting, moderate, productive cough x 4 days. He reports associated fever (tmax 100.2), sneezing, and rhinorrhea. Pt reports having a frequent hx of sinus infections in the past which he notes has decreased recently. Pt was seen by his PCP this morning and was diagnosed with a URI. Pt notes he did not have a fever when seeing his doctor but was advised if he started developing a fever to go to the ED for further evaluation. He reports taking Delsym and Mucinex to suppress his cough with mild relief. Pt has also taken tylenol to alleviate his fever without relief. He is currently on coumadin. Pt received his flu shot this year. Pt denies dysuria, new urinary frequency, abdominal pain, fluid gain and weight gain.  Past Medical History:  Diagnosis Date  . Acute on chronic systolic CHF (congestive heart failure) ([place]) 06/09/2015  . 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 Cardiomyopathy, ischemic 2011   with EF 25-35% by echo  . Coronary artery disease    Hx MI 1992, CABG 1998 , Nuc study 11.2013 large scar but no ischemia  . GERD (gastroesophageal reflux disease)   . H. pylori infection    Hx of   . H/O myocardial infarction, greater than 8 weeks 1992   large  ant wall injury  . NSVT (nonsustained ventricular tachycardia) ([place])   . Paroxysmal atrial fibrillation [facility])     Patient Active Problem List   Diagnosis Date Noted  . LBBB (left bundle branch block) 11/24/2015  . Bloating 06/17/2015  . Pseudogout of knee   . NSVT (nonsustained ventricular tachycardia) ([place])   . Cholecystitis   . Hyperlipidemia LDL goal <70 07/08/2014  . Hemorrhoids, internal 05/24/2014  . Abdominal pain, chronic, epigastric 04/08/2014  . 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 Paroxysmal atrial fibrillation ([place]) 02/11/2014  . Bradycardia 08/11/2012  . CABG '98. Low risk Myoview 11/13   . Long term current use of anticoagulant therapy 04/15/2012  . ICD (implantable cardioverter-defibrillator) in place   . SYSTOLIC HEART FAILURE, 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  . History of ventricular tachycardia 05/04/2008  . GERD 03/02/2008  . Asthma 10/09/2007  . Irritable bowel syndrome 10/09/2007  . Ischemic cardiomyopathy 05/13/2007    Past Surgical History:  Procedure Laterality Date  . CATARACT EXTRACTION, BILATERAL    . CORONARY ARTERY BYPASS GRAFT  1998   x 5  . HERNIA REPAIR  1960's   right inguinal  . ICD GENERATOR CHANGE  01/2008   medtronic, hx+ EP study  . KNEE SURGERY     right  . NASAL SINUS SURGERY  05/31/2010   Dr. Benjamine Mola       Home Medications    Prior to Admission medications   Medication Sig Start Date End Date  Taking? Authorizing Provider  acetaminophen (TYLENOL) 650 MG CR tablet Take 650 mg by mouth 2 (two) times daily.    Yes Historical Provider, MD  allopurinol (ZYLOPRIM) 300 MG tablet Take 300 mg by mouth daily.    Historical Provider, MD  aspirin 81 MG tablet Take 81 mg by mouth 3 (three) times a week. Monday, Wednesday, Friday    Historical Provider, MD  bisoprolol (ZEBETA) 5 MG tablet Take 0.5 tablets (2.5 mg total) by mouth daily. 01/19/16   Jolaine Artist, MD  cetirizine (ZYRTEC) 10 MG tablet Take 10 mg by mouth daily.      Historical Provider, MD   Cholecalciferol (VITAMIN D) 2000 UNITS CAPS Take 1 capsule by mouth daily.    Historical Provider, MD  dicyclomine (BENTYL) 10 MG capsule Take 10 mg by mouth 2 (two) times daily as needed (IBS).     Historical Provider, MD  esomeprazole (NEXIUM) 40 MG capsule Take 1 capsule (40 mg total) by mouth daily at 12 noon. 07/13/15   [name] D Zehr, PA-C  glycopyrrolate (ROBINUL) 2 MG tablet Take 2 mg by mouth 2 (two) times daily as needed (stomach).    Historical Provider, MD  isosorbide mononitrate (IMDUR) 30 MG 24 hr tablet Take 1 tablet (30 mg total) by mouth daily. PLEASE CONTACT OFFICE FOR ADDITIONAL REFILLS 01/19/16   Jolaine Artist, MD  LORazepam (ATIVAN) 1 MG tablet Take 0.5 mg by mouth at bedtime.    Historical Provider, MD  losartan (COZAAR) 25 MG tablet Take 1 tablet (25 mg total) by mouth at bedtime. 01/19/16   Jolaine Artist, MD  magnesium oxide (MAG-OX) 400 MG tablet Take 400 mg by mouth daily.    Historical Provider, MD  metolazone (ZAROXOLYN) 2.5 MG tablet Take 1 tablet (2.5 mg total) by mouth daily as needed (if weight 135 lbs or more). Take 40 meq potassium if you take this pill. Patient not taking: Reported on 01/19/2016 07/25/15   Shaune Pascal Bensimhon, MD  mometasone [facility]) 220 MCG/INH inhaler Inhale 1 puff into the lungs daily.     Historical Provider, MD  montelukast (SINGULAIR) 10 MG tablet Take 1 tablet (10 mg total) by mouth at bedtime. PLEASE SCHEDULE APPOINTMENT. 02/01/15   Troy Sine, MD  nitroGLYCERIN (NITROSTAT) 0.4 MG SL tablet Place 1 tablet (0.4 mg total) under the tongue every 5 (five) minutes as needed for chest pain. Patient not taking: Reported on 01/19/2016 07/25/15   Jolaine Artist, MD  omeprazole (PRILOSEC) 20 MG capsule Take 1 capsule (20 mg total) by mouth 2 (two) times daily before a meal. 04/19/15   Manus Gunning, MD  predniSONE (DELTASONE) 20 MG tablet Take 1 tablet (20 mg total) by mouth daily. 11/30/15   Blanchie Dessert, MD  silodosin  (RAPAFLO) 4 MG CAPS capsule Take 8 mg by mouth See admin instructions. Every 2 days    Historical Provider, MD  simvastatin (ZOCOR) 40 MG tablet TAKE ONE TABLET BY MOUTH ONCE DAILY AT BEDTIME 09/20/15   Troy Sine, MD  spironolactone (ALDACTONE) 25 MG tablet Take 1 tablet (25 mg total) by mouth daily. 01/19/16   Jolaine Artist, MD  torsemide (DEMADEX) 20 MG tablet Take 1 tablet (20 mg total) by mouth 2 (two) times daily. 01/19/16   Jolaine Artist, MD  warfarin (COUMADIN) 5 MG tablet TAKE ONE TABLET BY MOUTH ONCE DAILY OR AS DIRECTED BY  COUMADIN  CLINIC 12/16/15   Troy Sine, MD    Family  History Family History  Problem Relation Age of Onset  . Heart disease Father     questionable  . Stroke Mother   . Heart failure Sister   . Healthy Brother   . Healthy Sister   . Parkinsonism Brother   . Colon cancer Neg Hx     Social History Social History  Substance Use Topics  . Smoking status: Never Smoker  . Smokeless tobacco: Never Used  . Alcohol use 1.2 oz/week    2 Glasses of wine per week     Comment: socially     Allergies   Amoxicillin and Penicillins   Review of Systems Review of Systems  A complete 10 system review of systems was obtained and all systems are negative except as noted in the HPI and PMH.   Physical Exam Updated Vital Signs BP 118/65 (BP Location: Left Arm)   Pulse 67   Temp 100.2 F (37.9 C)   Resp 16   Ht 5\' 7"  (1.702 m)   Wt 140 lb (63.5 kg)   SpO2 99%   BMI 21.93 kg/m   Physical Exam  Constitutional: He is oriented to person, place, and time. He appears well-developed and well-nourished. No distress.  HENT:  Head: Normocephalic and atraumatic.  Eyes: Conjunctivae and EOM are normal. No scleral icterus.  Neck: Normal range of motion. Neck supple.  Cardiovascular: Normal rate, regular rhythm, normal heart sounds and intact distal pulses.   Pulmonary/Chest: Effort normal. No respiratory distress. He has rhonchi.  hyperresonant  in left lower lobe, wheezing and rhonchi bilaterally  Abdominal: Soft. He exhibits no distension. There is no tenderness.  Musculoskeletal: Normal range of motion. He exhibits no edema.  Neurological: He is alert and oriented to person, place, and time.  Skin: Skin is warm and dry. He is not diaphoretic.  Psychiatric: He has a normal mood and affect. His behavior is normal. Judgment normal.  Nursing note and vitals reviewed.    ED Treatments / Results  DIAGNOSTIC STUDIES: Oxygen Saturation is 99% on RA, normal by my interpretation.  COORDINATION OF CARE:  5:53 PM Will order CXR. Discussed treatment plan with pt at bedside and pt agreed to plan.  Labs (all labs ordered are listed, but only abnormal results are displayed) Labs Reviewed - No data to display  EKG  EKG Interpretation None       Radiology No results found.  Procedures Procedures (including critical care time)  Medications Ordered in ED Medications - No data to display   Initial Impression / Assessment and Plan / ED Course  I have reviewed the triage vital signs and the nursing notes.  Pertinent labs & imaging results that were available during my care of the patient were reviewed by me and considered in my medical decision making (see chart for details).    Pt CXR negative for acute infiltrate. Patients symptoms are consistent with URI, likely viral etiology. Discussed that antibiotics are not indicated for viral infections. Influenza pending. Will treat with Tamiflu. Rosezena Sensor understanding and is agreeable with plan. Discussed return precautions. Pt is hemodynamically stable & in NAD prior to dc.    Final Clinical Impressions(s) / ED Diagnoses   Final diagnoses:  Upper respiratory tract infection, unspecified type  Fever, unspecified fever cause   I personally performed the services described in this documentation, which was scribed in my presence. The recorded information has been reviewed and  is accurate.     New Prescriptions New Prescriptions  No medications on file     Margarita Mail, PA-C 03/20/16 0046    Julianne Rice, MD 03/20/16 [phone]

## 2016-03-19 NOTE — Telephone Encounter (Signed)
Patient called in saying he has been have fever with a cough and "flu-like" symptoms since Friday.  He was wanting to know what medications he could take for this.  I explained to him that he would need to contact his PCP or go to a urgent medical clinic to be seen for non-heart related symptoms.  He understands with no further questions.

## 2016-03-19 NOTE — Discharge Instructions (Signed)
You may review medications with the pharmacist at any pharmacy. They may be able to help you know more about potential drug interactions, as well as blood types of cold medications are safe reach take. You may try corcidin. Please follow up on her flu results. Return to the emergency department if you have worsening shortness of breath, uncontrolled fevers at home after medication with Tylenol or Motrin.

## 2016-03-19 NOTE — ED Notes (Signed)
Patient ambulatory to Xray.

## 2016-03-20 LAB — INFLUENZA PANEL BY PCR (TYPE A & B)
INFLAPCR: NEGATIVE
Influenza B By PCR: POSITIVE — AB

## 2016-03-22 ENCOUNTER — Encounter (HOSPITAL_COMMUNITY): Payer: Self-pay | Admitting: *Deleted

## 2016-03-22 ENCOUNTER — Telehealth (HOSPITAL_COMMUNITY): Payer: Self-pay | Admitting: *Deleted

## 2016-03-22 ENCOUNTER — Ambulatory Visit (HOSPITAL_COMMUNITY)
Admission: RE | Admit: 2016-03-22 | Discharge: 2016-03-22 | Disposition: A | Discharge status: Home / Self Care | Payer: Medicare Other | Source: Ambulatory Visit | Attending: Internal Medicine | Admitting: Internal Medicine

## 2016-03-22 ENCOUNTER — Telehealth: Payer: Self-pay | Admitting: Internal Medicine

## 2016-03-22 ENCOUNTER — Encounter (HOSPITAL_COMMUNITY): Payer: Self-pay

## 2016-03-22 VITALS — BP 105/92 | HR 118 | Temp 98.1°F

## 2016-03-22 DIAGNOSIS — I4891 Unspecified atrial fibrillation: Secondary | ICD-10-CM | POA: Insufficient documentation

## 2016-03-22 DIAGNOSIS — R9431 Abnormal electrocardiogram [ECG] [EKG]: Secondary | ICD-10-CM | POA: Diagnosis not present

## 2016-03-22 DIAGNOSIS — I454 Nonspecific intraventricular block: Secondary | ICD-10-CM | POA: Insufficient documentation

## 2016-03-22 DIAGNOSIS — I5022 Chronic systolic (congestive) heart failure: Secondary | ICD-10-CM | POA: Insufficient documentation

## 2016-03-22 DIAGNOSIS — I480 Paroxysmal atrial fibrillation: Secondary | ICD-10-CM

## 2016-03-22 LAB — PROTIME-INR
INR: 1.74
Prothrombin Time: 20.6 seconds — ABNORMAL HIGH (ref 11.4–15.2)

## 2016-03-22 NOTE — Patient Instructions (Signed)
Coumadin- take 7.5 mg today and then 5 mg every day prior to procedure.    You have been scheduled for a cardioversion with transesophageal echocardiogram. See instruction sheet for additional details.

## 2016-03-22 NOTE — Progress Notes (Signed)
Patient ID: [name], male   DOB: 12/06/1940, 76 y.o.   MRN: [id number]    Advanced Heart Failure Clinic Note   Date:  03/22/2016   ID:  [name], [name] 16-May-1940, MRN [id number]  PCP:  [name], MD  Primary Cardiologist:  Tresa Endo Referring: Dr. [name] Kill   History of Present Illness: [name] is a 76 y.o. 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 In September 1998 he underwent CABG revascularization surgery after an unsuccessful attempt at stenting of his proximal LAD by Dr. Juanda Chance. Ejection fraction was 25-30%. In 2002 he underwent initial ICD implantation for nonsustained ventricular tachycardia documented on event monitor for primary prevention. In January 2010 he underwent generator change with a Medtronic Virtuoso single chamber cardioverter defibrillator.  A nuclear perfusion study in November 2013 showed a large area of scar in the LAD territory (extent 44%) involving the mid to apical anterior,apical and infero-apical to mid infero-septal and apical lateral wall without associated ischemia.  He is on Coumadin anticoagulation for PAF. He was readmitted to the hospital from a May 2 through 06/04/2014 with recurrent atrial fibrillation and started on Tikosyn. He was enrolled in the genetic AF trial (bucindolol vs Toprol). He did poorly in the trial in the setting of titration of beta-blocker. He was referred to HF Clinic. Genetic AF study completed for pt. He is now on bisoprol 2.5 mg daily.   He has been discussing CRT upgrade with Dr. Graciela Husbands and has intermediate predictors of benefit (LBBB with QRS almost 150 ms, but ischemic cardiomyopathy and male gender).    He presents today for unscheduled visit. Has recently had the flu. Coughing a lot. Went back into AF yesterday. Feels more rundown. Denies orthopnea or PND. HR very irregular.  No bleeding on coumadin.    Echo 12/17 EF 15-20%, Moderate MR, RV with reduced function  Echo 5/17 EF 20% RV mildly down.   Optivol interrogation today shows fluid index well below threshold. Thoracic impedence stable. Pt activity 2 hrs a day. No AT/VT/VF.  Labs 10/04/15 K 5.3, Creatinine 0.85, BUN 15  Past Medical History:  Diagnosis Date  . Acute on chronic systolic CHF (congestive heart failure) ([facility]) 06/09/2015  . 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 Cardiomyopathy, ischemic 2011   with EF 25-35% by echo  . Coronary artery disease    Hx MI 1992, CABG 1998 , Nuc study 11.2013 large scar but no ischemia  . GERD (gastroesophageal reflux disease)   . H. pylori infection    Hx of   . 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 NSVT (nonsustained ventricular tachycardia) ([facility])   . Paroxysmal atrial fibrillation [facility])     Current Outpatient Prescriptions  Medication Sig Dispense Refill  . acetaminophen (TYLENOL) 650 MG CR tablet Take 650 mg by mouth 2 (two) times daily.     Marland Kitchen allopurinol (ZYLOPRIM) 300 MG tablet Take 300 mg by mouth daily.    Marland Kitchen aspirin 81 MG tablet Take 81 mg by mouth 3 (three) times a week. Monday, Wednesday, Friday    . bisoprolol (ZEBETA) 5 MG tablet Take 0.5 tablets (2.5 mg total) by mouth daily. 45 tablet 3  . cetirizine (ZYRTEC) 10 MG tablet Take 10 mg by mouth daily.      . Cholecalciferol (VITAMIN  D) 2000 UNITS CAPS Take 1 capsule by mouth daily.    Marland Kitchen dicyclomine (BENTYL) 10 MG capsule Take 10 mg by mouth 2 (two) times daily as needed (IBS).     Marland Kitchen esomeprazole (NEXIUM) 40 MG capsule Take 1 capsule (40 mg total) by mouth daily at 12 noon. 30 capsule 3  . glycopyrrolate (ROBINUL) 2 MG tablet Take 2 mg by mouth 2 (two) times daily as needed (stomach).    . isosorbide mononitrate (IMDUR) 30 MG 24 hr tablet Take 1 tablet (30 mg total) by mouth daily. PLEASE CONTACT OFFICE FOR ADDITIONAL REFILLS 90 tablet 3   . LORazepam (ATIVAN) 1 MG tablet Take 0.5 mg by mouth at bedtime.    Marland Kitchen losartan (COZAAR) 25 MG tablet Take 1 tablet (25 mg total) by mouth at bedtime. 90 tablet 3  . magnesium oxide (MAG-OX) 400 MG tablet Take 400 mg by mouth daily.    . metolazone (ZAROXOLYN) 2.5 MG tablet Take 1 tablet (2.5 mg total) by mouth daily as needed (if weight 135 lbs or more). Take 40 meq potassium if you take this pill. (Patient not taking: Reported on 01/19/2016) 2 tablet 0  . mometasone (ASMANEX) 220 MCG/INH inhaler Inhale 1 puff into the lungs daily.     . montelukast (SINGULAIR) 10 MG tablet Take 1 tablet (10 mg total) by mouth at bedtime. PLEASE SCHEDULE APPOINTMENT. 30 tablet 0  . nitroGLYCERIN (NITROSTAT) 0.4 MG SL tablet Place 1 tablet (0.4 mg total) under the tongue every 5 (five) minutes as needed for chest pain. (Patient not taking: Reported on 01/19/2016) 25 tablet 1  . omeprazole (PRILOSEC) 20 MG capsule Take 1 capsule (20 mg total) by mouth 2 (two) times daily before a meal. 180 capsule 3  . oseltamivir (TAMIFLU) 75 MG capsule Take 1 capsule (75 mg total) by mouth every 12 (twelve) hours. 10 capsule 0  . predniSONE (DELTASONE) 20 MG tablet Take 1 tablet (20 mg total) by mouth daily. 5 tablet 0  . silodosin (RAPAFLO) 4 MG CAPS capsule Take 8 mg by mouth See admin instructions. Every 2 days    . simvastatin (ZOCOR) 40 MG tablet TAKE ONE TABLET BY MOUTH ONCE DAILY AT BEDTIME 90 tablet 2  . spironolactone (ALDACTONE) 25 MG tablet Take 1 tablet (25 mg total) by mouth daily. 90 tablet 3  . torsemide (DEMADEX) 20 MG tablet Take 1 tablet (20 mg total) by mouth 2 (two) times daily. 180 tablet 3  . warfarin (COUMADIN) 5 MG tablet TAKE ONE TABLET BY MOUTH ONCE DAILY OR AS DIRECTED BY  COUMADIN  CLINIC 90 tablet 1   No current facility-administered medications for this encounter.     Allergies:    Allergies  Allergen Reactions  . Amoxicillin Rash  . Penicillins Rash    Social History:  The patient  reports  that he has never smoked. He has never used smokeless tobacco. He reports that he drinks about 1.2 oz of alcohol per week . He reports that he does not use drugs.   Family history:   Family History  Problem Relation Age of Onset  . Heart disease Father     questionable  . Stroke Mother   . Heart failure Sister   . Healthy Brother   . Healthy Sister   . Parkinsonism Brother   . Colon cancer Neg Hx    ROS:  Please see history of present illness. All other systems reviewed and negative.    PHYSICAL EXAM: VS:  BP Marland Kitchen)  105/92   Pulse (!) 118   Temp 98.1 F (36.7 C)   SpO2 96%    Wt Readings from Last 3 Encounters:  03/19/16 140 lb (63.5 kg)  01/19/16 141 lb 12 oz (64.3 kg)  11/30/15 142 lb (64.4 kg)   General: Elderly appearing. Fatigued. + cough HEENT: Normal Neck: JVP 7 cm. No thyromegaly or nodule noted.  Cardiac: IRR, IRR  S1/S2 with ectopic beats. Lungs:  Clear, normal effort.  Abd: soft, NT, ND, no HSM. No bruits or masses. +BS  Ext: No peripheral edema. 2+ radial and dorsalis pedis pulses. Skin: Warm and dry  Neuro: Grossly normal. Moves all 4 extremities   ECG: AF 86 LBBB  ASSESSMENT AND PLAN:  1. Recurrent PAF -- He has recurrent AF in setting of the flu. He does not tolerate AF well despite adequate rate control. INR today 1.74. Will increase coumadin and plan TEE/DC-CV on Monday 2. Chronic systolic HF due to iCM, Echo 05/2015 EF 20-25% --volume status ok. Continue current regimen.  --continue CRT upgrade at some point 3. CAD s/p previous anterior infarct --stable no ischemia.  4. LBBB  Shyler Holzman,MD 10:56 PM

## 2016-03-22 NOTE — Telephone Encounter (Signed)
Pt called to report he went into a-fib last night, he feels due to his flu and coughing, which have improved.  He called on-call MD last night and was advised to take extra Bisoprolol which he did, HR was in the 100s then.  He states it is now at 98, BP 93/55 he states he is feeling ok.    Discussed w/Dr Bensimhon, he recommends pt come in today for EKG and INR, if in a-fib and INR therapeutic will need to sch DCCV, possible for tomorrow.    Pt aware and agreeable, he will come in this afternoon between 2-3.  appt sch

## 2016-03-22 NOTE — Telephone Encounter (Signed)
Patient called because tachycardic. Minimally symptomatic, though still has a cough from his recent flu infection. Checked pulse and found that it was 128 bpm. Typically HR is around 60. Has history of Af. Takes bisoprolol. BP 109/67. Doesn't feel like he is retaining fluid. No fevers. No chest pain or SOB.  Instructed patient to take an extra dose of bisoprolol and check in in the Am. If new symptoms develop or if current symptoms worsen patient understands he should go to the Ed.

## 2016-03-23 ENCOUNTER — Other Ambulatory Visit (HOSPITAL_COMMUNITY): Payer: Self-pay | Admitting: *Deleted

## 2016-03-23 ENCOUNTER — Ambulatory Visit: Payer: Medicare Other | Admitting: Gastroenterology

## 2016-03-23 DIAGNOSIS — I482 Chronic atrial fibrillation, unspecified: Secondary | ICD-10-CM

## 2016-03-26 ENCOUNTER — Encounter (HOSPITAL_COMMUNITY): Payer: Self-pay | Admitting: Certified Registered Nurse Anesthetist

## 2016-03-26 ENCOUNTER — Encounter (HOSPITAL_COMMUNITY): Payer: Medicare Other | Admitting: Internal Medicine

## 2016-03-26 ENCOUNTER — Encounter (HOSPITAL_COMMUNITY): Admission: RE | Payer: Self-pay | Source: Ambulatory Visit

## 2016-03-26 ENCOUNTER — Ambulatory Visit (HOSPITAL_COMMUNITY): Admission: RE | Admit: 2016-03-26 | Payer: Medicare Other | Source: Ambulatory Visit | Admitting: Internal Medicine

## 2016-03-26 SURGERY — ECHOCARDIOGRAM, TRANSESOPHAGEAL
Anesthesia: Monitor Anesthesia Care

## 2016-04-04 ENCOUNTER — Ambulatory Visit (INDEPENDENT_AMBULATORY_CARE_PROVIDER_SITE_OTHER): Payer: Medicare Other | Admitting: Pharmacist

## 2016-04-04 DIAGNOSIS — I255 Ischemic cardiomyopathy: Secondary | ICD-10-CM | POA: Diagnosis not present

## 2016-04-04 DIAGNOSIS — I480 Paroxysmal atrial fibrillation: Secondary | ICD-10-CM

## 2016-04-04 DIAGNOSIS — Z7901 Long term (current) use of anticoagulants: Secondary | ICD-10-CM

## 2016-04-04 DIAGNOSIS — I4891 Unspecified atrial fibrillation: Secondary | ICD-10-CM | POA: Diagnosis not present

## 2016-04-04 LAB — POCT INR: INR: 2.3

## 2016-04-19 ENCOUNTER — Ambulatory Visit (HOSPITAL_COMMUNITY)
Admission: RE | Admit: 2016-04-19 | Discharge: 2016-04-19 | Disposition: A | Discharge status: Home / Self Care | Payer: Medicare Other | Source: Ambulatory Visit | Attending: Internal Medicine | Admitting: Internal Medicine

## 2016-04-19 VITALS — BP 96/52 | HR 64 | Wt 143.5 lb

## 2016-04-19 DIAGNOSIS — Z79899 Other long term (current) drug therapy: Secondary | ICD-10-CM | POA: Insufficient documentation

## 2016-04-19 DIAGNOSIS — K219 Gastro-esophageal reflux disease without esophagitis: Secondary | ICD-10-CM | POA: Insufficient documentation

## 2016-04-19 DIAGNOSIS — Z7982 Long term (current) use of aspirin: Secondary | ICD-10-CM | POA: Insufficient documentation

## 2016-04-19 DIAGNOSIS — Z823 Family history of stroke: Secondary | ICD-10-CM | POA: Insufficient documentation

## 2016-04-19 DIAGNOSIS — Z800 Family history of malignant neoplasm of digestive organs: Secondary | ICD-10-CM | POA: Diagnosis not present

## 2016-04-19 DIAGNOSIS — I472 Ventricular tachycardia: Secondary | ICD-10-CM | POA: Diagnosis not present

## 2016-04-19 DIAGNOSIS — Z95810 Presence of automatic (implantable) cardiac defibrillator: Secondary | ICD-10-CM | POA: Diagnosis not present

## 2016-04-19 DIAGNOSIS — Z951 Presence of aortocoronary bypass graft: Secondary | ICD-10-CM | POA: Diagnosis not present

## 2016-04-19 DIAGNOSIS — M25561 Pain in right knee: Secondary | ICD-10-CM | POA: Diagnosis not present

## 2016-04-19 DIAGNOSIS — I5022 Chronic systolic (congestive) heart failure: Secondary | ICD-10-CM | POA: Diagnosis not present

## 2016-04-19 DIAGNOSIS — I447 Left bundle-branch block, unspecified: Secondary | ICD-10-CM | POA: Diagnosis not present

## 2016-04-19 DIAGNOSIS — I480 Paroxysmal atrial fibrillation: Secondary | ICD-10-CM | POA: Insufficient documentation

## 2016-04-19 DIAGNOSIS — I2510 Atherosclerotic heart disease of native coronary artery without angina pectoris: Secondary | ICD-10-CM | POA: Insufficient documentation

## 2016-04-19 DIAGNOSIS — Z7901 Long term (current) use of anticoagulants: Secondary | ICD-10-CM | POA: Insufficient documentation

## 2016-04-19 DIAGNOSIS — Z880 Allergy status to penicillin: Secondary | ICD-10-CM | POA: Insufficient documentation

## 2016-04-19 DIAGNOSIS — Z8249 Family history of ischemic heart disease and other diseases of the circulatory system: Secondary | ICD-10-CM | POA: Insufficient documentation

## 2016-04-19 DIAGNOSIS — I255 Ischemic cardiomyopathy: Secondary | ICD-10-CM | POA: Diagnosis not present

## 2016-04-19 DIAGNOSIS — I252 Old myocardial infarction: Secondary | ICD-10-CM | POA: Diagnosis not present

## 2016-04-19 DIAGNOSIS — M109 Gout, unspecified: Secondary | ICD-10-CM | POA: Diagnosis not present

## 2016-04-19 NOTE — Progress Notes (Signed)
Patient ID: [name], male   DOB: 1940/04/18, 76 y.o.   MRN: [id number]    Advanced Heart Failure Clinic Note   Date:  04/19/2016   ID:  [name] 13-Dec-1940, MRN [id number]  PCP:  [name], MD  Primary Cardiologist:  Tresa Endo Referring: Dr. [name] Kill   History of Present Illness: [name] is a 76 y.o. 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 In September 1998 he underwent CABG revascularization surgery after an unsuccessful attempt at stenting of his proximal LAD by Dr. Juanda Chance. Ejection fraction was 25-30%. In 2002 he underwent initial ICD implantation for nonsustained ventricular tachycardia documented on event monitor for primary prevention. In January 2010 he underwent generator change with a Medtronic Virtuoso single chamber cardioverter defibrillator.  A nuclear perfusion study in November 2013 showed a large area of scar in the LAD territory (extent 44%) involving the mid to apical anterior,apical and infero-apical to mid infero-septal and apical lateral wall without associated ischemia.  He is on Coumadin anticoagulation for PAF. He was readmitted to the hospital from a May 2 through 06/04/2014 with recurrent atrial fibrillation and started on Tikosyn. He was enrolled in the genetic AF trial (bucindolol vs Toprol). He did poorly in the trial in the setting of titration of beta-blocker. He was referred to HF Clinic. Genetic AF study completed for pt. He is now on bisoprol 2.5 mg daily.   He has been discussing CRT upgrade with Dr. Graciela Husbands and has intermediate predictors of benefit (LBBB with QRS almost 150 ms, but ischemic cardiomyopathy and male gender).    He presents today for routine follow-up.  Overall doing pretty well. Had CPX 2 months ago with moderate HF limitation. Gets SOB if he is carrying something. NO CP or SOB otherwise. No palpitations. No  orthopnea/PND/edema. Continues with walking group. Gets 5k-10k steps per day.   CPX 02/17/16 Resting HR: 73 Peak HR: 108  (75% age predicted max HR) BP rest: 110/58 BP peak: 150/56 Peak VO2: 14.4 (58% predicted peak VO2) VE/VCO2 slope: 33 OUES: 1.32 Peak RER: 1.10 PETCO2 at peak: 33 O2pulse: 9  (82% predicted O2pulse)  Echo 12/17 EF 15-20%, Moderate MR, RV with reduced function Echo 5/17 EF 20% RV mildly down.   Optivol interrogation today shows fluid index well below threshold. Thoracic impedence stable. Pt activity 2 hrs a day. No AT/VT/VF.  Labs 10/04/15 K 5.3, Creatinine 0.85, BUN 15  Past Medical History:  Diagnosis Date  . Acute on chronic systolic CHF (congestive heart failure) (HCC) 06/09/2015  . 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 Cardiomyopathy, ischemic 2011   with EF 25-35% by echo  . Coronary artery disease    Hx MI 1992, CABG 1998 , Nuc study 11.2013 large scar but no ischemia  . GERD (gastroesophageal reflux disease)   . H. pylori infection    Hx of   . 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 NSVT (nonsustained ventricular tachycardia) (HCC)   . Paroxysmal atrial fibrillation [facility])     Current Outpatient Prescriptions  Medication Sig Dispense Refill  . acetaminophen (TYLENOL) 650 MG CR tablet Take 650 mg by mouth 2 (two) times daily.     Marland Kitchen allopurinol (ZYLOPRIM) 300 MG tablet Take 300 mg by mouth daily.    Marland Kitchen aspirin 81 MG  tablet Take 81 mg by mouth 3 (three) times a week. Monday, Wednesday, Friday    . bisoprolol (ZEBETA) 5 MG tablet Take 0.5 tablets (2.5 mg total) by mouth daily. 45 tablet 3  . cetirizine (ZYRTEC) 10 MG tablet Take 10 mg by mouth daily.      . Cholecalciferol (VITAMIN D) 2000 UNITS CAPS Take 1 capsule by mouth daily.    Marland Kitchen dicyclomine (BENTYL) 10 MG capsule Take 10 mg by mouth 2 (two) times daily as needed (IBS).     Marland Kitchen esomeprazole (NEXIUM) 40 MG  capsule Take 1 capsule (40 mg total) by mouth daily at 12 noon. 30 capsule 3  . glycopyrrolate (ROBINUL) 2 MG tablet Take 2 mg by mouth 2 (two) times daily as needed (stomach).    . isosorbide mononitrate (IMDUR) 30 MG 24 hr tablet Take 1 tablet (30 mg total) by mouth daily. PLEASE CONTACT OFFICE FOR ADDITIONAL REFILLS 90 tablet 3  . LORazepam (ATIVAN) 1 MG tablet Take 0.5 mg by mouth at bedtime.    Marland Kitchen losartan (COZAAR) 25 MG tablet Take 1 tablet (25 mg total) by mouth at bedtime. 90 tablet 3  . magnesium oxide (MAG-OX) 400 MG tablet Take 400 mg by mouth daily.    . metolazone (ZAROXOLYN) 2.5 MG tablet Take 1 tablet (2.5 mg total) by mouth daily as needed (if weight 135 lbs or more). Take 40 meq potassium if you take this pill. 2 tablet 0  . mometasone (ASMANEX) 220 MCG/INH inhaler Inhale 1 puff into the lungs daily.     . montelukast (SINGULAIR) 10 MG tablet Take 1 tablet (10 mg total) by mouth at bedtime. PLEASE SCHEDULE APPOINTMENT. 30 tablet 0  . nitroGLYCERIN (NITROSTAT) 0.4 MG SL tablet Place 1 tablet (0.4 mg total) under the tongue every 5 (five) minutes as needed for chest pain. 25 tablet 1  . omeprazole (PRILOSEC) 20 MG capsule Take 1 capsule (20 mg total) by mouth 2 (two) times daily before a meal. 180 capsule 3  . predniSONE (DELTASONE) 20 MG tablet Take 1 tablet (20 mg total) by mouth daily. 5 tablet 0  . silodosin (RAPAFLO) 4 MG CAPS capsule Take 8 mg by mouth See admin instructions. Every 2 days    . simvastatin (ZOCOR) 40 MG tablet TAKE ONE TABLET BY MOUTH ONCE DAILY AT BEDTIME 90 tablet 2  . spironolactone (ALDACTONE) 25 MG tablet Take 1 tablet (25 mg total) by mouth daily. 90 tablet 3  . torsemide (DEMADEX) 20 MG tablet Take 1 tablet (20 mg total) by mouth 2 (two) times daily. 180 tablet 3  . warfarin (COUMADIN) 5 MG tablet TAKE ONE TABLET BY MOUTH ONCE DAILY OR AS DIRECTED BY  COUMADIN  CLINIC 90 tablet 1   No current facility-administered medications for this encounter.      Allergies:    Allergies  Allergen Reactions  . Amoxicillin Rash  . Penicillins Rash    Social History:  The patient  reports that he has never smoked. He has never used smokeless tobacco. He reports that he drinks about 1.2 oz of alcohol per week . He reports that he does not use drugs.   Family history:   Family History  Problem Relation Age of Onset  . Heart disease Father     questionable  . Stroke Mother   . Heart failure Sister   . Healthy Brother   . Healthy Sister   . Parkinsonism Brother   . Colon cancer Neg Hx  ROS:  Please see history of present illness. All other systems reviewed and negative.    PHYSICAL EXAM: VS:  BP (!) 96/52   Pulse 64   Wt 143 lb 8 oz (65.1 kg)   SpO2 96%   BMI 22.48 kg/m    Wt Readings from Last 3 Encounters:  04/19/16 143 lb 8 oz (65.1 kg)  03/19/16 140 lb (63.5 kg)  01/19/16 141 lb 12 oz (64.3 kg)   General: Well appearing NAD  HEENT: Normal Neck: JVP 5 cm. No thyromegaly or nodule noted.  Cardiac: PMI laterally displaced  S1/S2 no murmur no gallop Lungs:  Clear, normal effort. No wheeze Abd: soft, NT, ND, no HSM. No bruits or masses. +BS  Ext: No cyanosis or clubbing. No peripheral edema. Skin: Warm and dry  Neuro: Grossly normal. Moves all 4 extremities   ASSESSMENT AND PLAN:  1. Chronic systolic HF due to iCM, Echo 12/2015 EF 20 --CPX from 1/18 reviewed with him again. Shows moderate HF limitation --Volume status ok on exam and by Optivol. Continue current regimen. BP too soft to titrate. --Given LBBB and moderate HF limitation, I think we need to consider CRT upgrade. I have asked him to revisit with Dr. Graciela Husbands.  2. PAF -- Had recurrent AF in setting of the flu 2 months ago. Now back in NSR --On warfarin. We discussed Eliquis. He will check on his copay 3. CAD s/p previous anterior infarct --stable no ischemia. Off ASA due to Eliquis. Continue statin and b-blocker 4. LBBB --consider CRT as above  Horacio Werth,  Lavarius Doughten,MD 3:28 PM

## 2016-04-19 NOTE — Patient Instructions (Signed)
We will contact you in 4 months to schedule your next appointment.  

## 2016-04-24 ENCOUNTER — Encounter: Payer: Self-pay | Admitting: Gastroenterology

## 2016-04-24 ENCOUNTER — Ambulatory Visit (INDEPENDENT_AMBULATORY_CARE_PROVIDER_SITE_OTHER): Payer: Medicare Other | Admitting: Gastroenterology

## 2016-04-24 VITALS — BP 110/56 | HR 64 | Ht 65.5 in | Wt 147.4 lb

## 2016-04-24 DIAGNOSIS — K589 Irritable bowel syndrome without diarrhea: Secondary | ICD-10-CM

## 2016-04-24 DIAGNOSIS — I255 Ischemic cardiomyopathy: Secondary | ICD-10-CM

## 2016-04-24 DIAGNOSIS — K219 Gastro-esophageal reflux disease without esophagitis: Secondary | ICD-10-CM | POA: Diagnosis not present

## 2016-04-24 DIAGNOSIS — Z1211 Encounter for screening for malignant neoplasm of colon: Secondary | ICD-10-CM | POA: Diagnosis not present

## 2016-04-24 MED ORDER — DICYCLOMINE HCL 10 MG PO CAPS: 10.0000 mg | ORAL_CAPSULE | Freq: Three times a day (TID) | ORAL | 1 refills | Status: DC | PRN

## 2016-04-24 NOTE — Patient Instructions (Signed)
If you are age 77 or older, your body mass index should be between 23-30. Your Body mass index is 24.15 kg/m. If this is out of the aforementioned range listed, please consider follow up with your Primary Care Provider.  If you are age 40 or younger, your body mass index should be between 19-25. Your Body mass index is 24.15 kg/m. If this is out of the aformentioned range listed, please consider follow up with your Primary Care Provider.   We have sent the following medications to your pharmacy for you to pick up at your convenience:  Bentyl  Your provider has ordered Cologuard testing as an option for colon cancer screening. This is performed by [facility] and may be out of network with your insurance. PRIOR to completing the test, it is YOUR responsibility to contact your insurance about covered benefits for this test. Your out of pocket expense could be anywhere from $0.00 to $649.00. The CPT code to provide to your insurance carrier is [phone].  We have already sent your demographic and insurance information to [facility] (phone number [phone]) and they should contact you within the next week regarding your test. If you have not heard from them within the next week, please call our office at [phone].   Thank you.

## 2016-04-24 NOTE — Progress Notes (Signed)
HPI :  76 year old male with a history of coronary artery disease and history of NSVT, as well as heart failure with ejection fraction around 20%, here for a follow-up visit. He's been followed in our office recently for colon cancer screening, GERD, irritable bowel syndrome.   He is never had a prior colonoscopy due to his extensive cardiac history. He had a negative Cologuard in January 2015. He has no family history of colon cancer. He denies any blood in the stools. No changes to his bowels otherwise. No FH of colon cancer. He has history of IBS. Not much bothering him right now, using bentyl PRN  He is taking omeprazole 20mg  once daily for reflux symptoms. He previously has needed this as symptoms were bothering him on other options. He reports this controls his reflux symptoms. He denies much breakthrough at all. He denies dysphagia, swallowing okay. Good appetite. No vomiting. Prior EGD in 10/96 without barrett's.   He is followed with Dr. Zoila Shutter for CHF, EF around 20%. He is not retaining fluid and doing well. He takes Coumadin currently. No exertional symptoms.   Past Medical History:  Diagnosis Date  . Acute on chronic systolic CHF (congestive heart failure) ([place]) 06/09/2015  . 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 Cardiomyopathy, ischemic 2011   with EF 25-35% by echo  . Coronary artery disease    Hx MI 1992, CABG 1998 , Nuc study 11.2013 large scar but no ischemia  . GERD (gastroesophageal reflux disease)   . H. pylori infection    Hx of   . 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 NSVT (nonsustained ventricular tachycardia) ([place])   . Paroxysmal atrial fibrillation [facility])      Past Surgical History:  Procedure Laterality Date  . CATARACT EXTRACTION, BILATERAL    . CORONARY ARTERY BYPASS GRAFT  1998   x 5  . HERNIA REPAIR  1960's   right inguinal  . ICD GENERATOR CHANGE  01/2008   medtronic, hx+ EP study  . KNEE SURGERY     right  . NASAL SINUS SURGERY  05/31/2010   Dr. Benjamine Mola   Family History  Problem Relation Age of Onset  . Heart disease Father     questionable  . Stroke Mother   . Heart failure Sister   . Healthy Brother   . Healthy Sister   . Parkinsonism Brother   . Colon cancer Neg Hx    Social History  Substance Use Topics  . Smoking status: Never Smoker  . Smokeless tobacco: Never Used  . Alcohol use 1.2 oz/week    2 Glasses of wine per week     Comment: socially   Current Outpatient Prescriptions  Medication Sig Dispense Refill  . acetaminophen (TYLENOL) 650 MG CR tablet Take 650 mg by mouth 2 (two) times daily.     Marland Kitchen allopurinol (ZYLOPRIM) 300 MG tablet Take 300 mg by mouth daily.    Marland Kitchen aspirin 81 MG tablet Take 81 mg by mouth 3 (three) times a week. Monday, Wednesday, Friday    . bisoprolol (ZEBETA) 5 MG tablet Take 0.5 tablets (2.5 mg total) by mouth daily. 45 tablet 3  . cetirizine (ZYRTEC) 10 MG tablet Take 10 mg by mouth daily.      . Cholecalciferol (VITAMIN D) 2000 UNITS CAPS Take 1 capsule by mouth daily.    Marland Kitchen dicyclomine (BENTYL) 10 MG capsule Take  10 mg by mouth every 8 (eight) hours as needed (IBS).    Marland Kitchen esomeprazole (NEXIUM) 40 MG capsule Take 1 capsule (40 mg total) by mouth daily at 12 noon. 30 capsule 3  . isosorbide mononitrate (IMDUR) 30 MG 24 hr tablet Take 1 tablet (30 mg total) by mouth daily. PLEASE CONTACT OFFICE FOR ADDITIONAL REFILLS 90 tablet 3  . LORazepam (ATIVAN) 1 MG tablet Take 0.5 mg by mouth at bedtime.    Marland Kitchen losartan (COZAAR) 25 MG tablet Take 1 tablet (25 mg total) by mouth at bedtime. 90 tablet 3  . magnesium oxide (MAG-OX) 400 MG tablet Take 400 mg by mouth daily.    . metolazone (ZAROXOLYN) 2.5 MG tablet Take 1 tablet (2.5 mg total) by mouth daily as needed (if weight 135 lbs or more). Take 40 meq potassium if you take this pill. 2 tablet 0  . mometasone (ASMANEX) 220 MCG/INH inhaler Inhale 1 puff into the lungs  daily.     . montelukast (SINGULAIR) 10 MG tablet Take 1 tablet (10 mg total) by mouth at bedtime. PLEASE SCHEDULE APPOINTMENT. 30 tablet 0  . nitroGLYCERIN (NITROSTAT) 0.4 MG SL tablet Place 1 tablet (0.4 mg total) under the tongue every 5 (five) minutes as needed for chest pain. 25 tablet 1  . omeprazole (PRILOSEC) 20 MG capsule Take 1 capsule (20 mg total) by mouth 2 (two) times daily before a meal. 180 capsule 3  . silodosin (RAPAFLO) 4 MG CAPS capsule Take 8 mg by mouth See admin instructions. Every 2 days    . simvastatin (ZOCOR) 40 MG tablet TAKE ONE TABLET BY MOUTH ONCE DAILY AT BEDTIME 90 tablet 2  . spironolactone (ALDACTONE) 25 MG tablet Take 1 tablet (25 mg total) by mouth daily. 90 tablet 3  . torsemide (DEMADEX) 20 MG tablet Take 1 tablet (20 mg total) by mouth 2 (two) times daily. 180 tablet 3  . warfarin (COUMADIN) 5 MG tablet TAKE ONE TABLET BY MOUTH ONCE DAILY OR AS DIRECTED BY  COUMADIN  CLINIC 90 tablet 1  . dicyclomine (BENTYL) 10 MG capsule Take 1 capsule (10 mg total) by mouth every 8 (eight) hours as needed for spasms. 90 capsule 1   No current facility-administered medications for this visit.    Allergies  Allergen Reactions  . Amoxicillin Rash  . Penicillins Rash     Review of Systems: All systems reviewed and negative except where noted in HPI.   Lab Results  Component Value Date   WBC 7.9 03/19/2016   HGB 13.4 03/19/2016   HCT 40.3 03/19/2016   MCV 87.8 03/19/2016   PLT 166 03/19/2016    Lab Results  Component Value Date   CREATININE 1.13 03/19/2016   BUN 16 03/19/2016   NA 135 03/19/2016   K 3.4 (L) 03/19/2016   CL 97 (L) 03/19/2016   CO2 31 03/19/2016    Lab Results  Component Value Date   ALT 20 11/30/2015   AST 27 11/30/2015   ALKPHOS 81 11/30/2015   BILITOT 0.8 11/30/2015     Physical Exam: BP (!) 110/56 (BP Location: Left Arm, Patient Position: Sitting, Cuff Size: Normal)   Pulse 64   Ht 5' 5.5" (1.664 m)   Wt 147 lb 6 oz (66.8  kg)   BMI 24.15 kg/m  Constitutional: Pleasant,well-developed, male in no acute distress. HEENT: Normocephalic and atraumatic. Conjunctivae are normal. No scleral icterus. Neck supple.  Cardiovascular: Normal rate, regular rhythm.  Pulmonary/chest: Effort normal and breath sounds  normal. No wheezing, rales or rhonchi. Abdominal: Soft, nondistended, nontender. . There are no masses palpable. No hepatomegaly. Extremities: no edema Lymphadenopathy: No cervical adenopathy noted. Neurological: Alert and oriented to person place and time. Skin: Skin is warm and dry. No rashes noted. Psychiatric: Normal mood and affect. Behavior is normal.   ASSESSMENT AND PLAN: 76 year old male with a significant cardiac history here for reassessment discussed following issues:  Colon cancer screening - negative Cologuard about 3 years ago. We discussed colon cancer screening options, risks/benefits of each, as well as risks and benefits of stopping further colon cancer screening. At his age and with his other medical problems I think it is reasonable to stop colon cancer screening exams at this time. However, following our discussion, he was not comfortable with stopping screening exams, and wanted to proceed with stool based testing and to repeat a Cologuard. He understood that if his Cologuard is positive, this will lead to a recommendation for a colonoscopy. If he needs a colonoscopy, it would be done at the hospital, and would need to have clearance from cardiology to stop his Coumadin. If the Cologuard is negative, this would be reassuring and would recommend no further colon cancer screening tests. He agreed with the plan.   GERD - well controlled on low-dose omeprazole, he wishes to continue this for now. We have previously discussed risks and benefits of this regimen.  IBS - stable, symptoms well-controlled. We'll continue low-dose Bentyl when necessary, we'll stop Robinul, he is not using it.   Branson Cellar, MD [facility] Pager [phone]

## 2016-04-25 ENCOUNTER — Telehealth: Payer: Self-pay | Admitting: Pharmacist Clinician (PhC)/ Clinical Pharmacy Specialist

## 2016-04-25 DIAGNOSIS — M25561 Pain in right knee: Secondary | ICD-10-CM | POA: Diagnosis not present

## 2016-04-25 DIAGNOSIS — M109 Gout, unspecified: Secondary | ICD-10-CM | POA: Diagnosis not present

## 2016-04-25 DIAGNOSIS — Z79899 Other long term (current) drug therapy: Secondary | ICD-10-CM | POA: Diagnosis not present

## 2016-04-25 DIAGNOSIS — I509 Heart failure, unspecified: Secondary | ICD-10-CM | POA: Diagnosis not present

## 2016-04-25 NOTE — Telephone Encounter (Signed)
Patient calling and states that he has some questions about the change with his coumadin medications. Please call to discuss,thanks.

## 2016-04-26 ENCOUNTER — Ambulatory Visit (INDEPENDENT_AMBULATORY_CARE_PROVIDER_SITE_OTHER): Payer: Medicare Other

## 2016-04-26 DIAGNOSIS — Z95810 Presence of automatic (implantable) cardiac defibrillator: Secondary | ICD-10-CM | POA: Diagnosis not present

## 2016-04-26 DIAGNOSIS — I5022 Chronic systolic (congestive) heart failure: Secondary | ICD-10-CM

## 2016-04-26 NOTE — Progress Notes (Signed)
EPIC Encounter for ICM Monitoring  Patient Name: [name] is a 76 y.o. male Date: 04/26/2016 Primary Care Physican: [name], MD Primary Cardiologist:Kelly/Bensimhon Electrophysiologist: Faustino Congress Weight:unknown               Heart Failure questions reviewed, pt asymptomatic.   Thoracic impedance normal.  Prescribed and confirmed dosage: Torsemide 20 mg 1 tablet twice a day  Recommendations: No changes. Reminded to limit dietary salt intake to 2000 mg/day and fluid intake to < 2 liters/day. Encouraged to call for fluid symptoms.  Follow-up plan: ICM clinic phone appointment on 05/29/2016.  Copy of ICM check sent to device physician.   3 month ICM trend: 04/26/2016   1 Year ICM trend:      [name], RN 04/26/2016 11:49 AM

## 2016-04-26 NOTE — Telephone Encounter (Signed)
Returned call - spoke with Dr. [name], they reviewed option of switching to Eliquis.  Patient interested in switch.  Has INR app on April 4, will do

## 2016-04-26 NOTE — Telephone Encounter (Signed)
INR and conversion at that time.

## 2016-04-27 ENCOUNTER — Telehealth: Payer: Self-pay | Admitting: Internal Medicine

## 2016-04-27 NOTE — Telephone Encounter (Signed)
Spoke w/ pt and informed him that we can not move the 05-29-16 remote transmission up to 05-25-16 b/c his insurance will not pay for it. But we can push it back. Pt agreed to 06-01-16.

## 2016-04-27 NOTE — Telephone Encounter (Signed)
New message    Pt needs to change the remote device check  From 05/29/16 , move it to early am 05/25/16

## 2016-05-02 ENCOUNTER — Ambulatory Visit (INDEPENDENT_AMBULATORY_CARE_PROVIDER_SITE_OTHER): Payer: Medicare Other | Admitting: Pharmacist

## 2016-05-02 DIAGNOSIS — I480 Paroxysmal atrial fibrillation: Secondary | ICD-10-CM

## 2016-05-02 DIAGNOSIS — I4891 Unspecified atrial fibrillation: Secondary | ICD-10-CM | POA: Diagnosis not present

## 2016-05-02 DIAGNOSIS — Z7901 Long term (current) use of anticoagulants: Secondary | ICD-10-CM

## 2016-05-02 DIAGNOSIS — I255 Ischemic cardiomyopathy: Secondary | ICD-10-CM | POA: Diagnosis not present

## 2016-05-02 LAB — POCT INR: INR: 2.2

## 2016-05-02 MED ORDER — ELIQUIS 5 MG PO TABS: 5.0000 mg | ORAL_TABLET | Freq: Two times a day (BID) | ORAL | 0 refills | Status: DC

## 2016-05-04 ENCOUNTER — Other Ambulatory Visit: Payer: Self-pay | Admitting: Pharmacist

## 2016-05-04 MED ORDER — ELIQUIS 5 MG PO TABS: 5.0000 mg | ORAL_TABLET | Freq: Two times a day (BID) | ORAL | 11 refills | Status: DC

## 2016-05-04 NOTE — Telephone Encounter (Signed)
Rx needed to complete pt assistance application

## 2016-05-07 DIAGNOSIS — Z1212 Encounter for screening for malignant neoplasm of rectum: Secondary | ICD-10-CM | POA: Diagnosis not present

## 2016-05-07 DIAGNOSIS — Z1211 Encounter for screening for malignant neoplasm of colon: Secondary | ICD-10-CM | POA: Diagnosis not present

## 2016-05-16 DIAGNOSIS — J4540 Moderate persistent asthma, uncomplicated: Secondary | ICD-10-CM | POA: Diagnosis not present

## 2016-05-16 DIAGNOSIS — J3089 Other allergic rhinitis: Secondary | ICD-10-CM | POA: Diagnosis not present

## 2016-05-16 DIAGNOSIS — J3081 Allergic rhinitis due to animal (cat) (dog) hair and dander: Secondary | ICD-10-CM | POA: Diagnosis not present

## 2016-05-16 DIAGNOSIS — J301 Allergic rhinitis due to pollen: Secondary | ICD-10-CM | POA: Diagnosis not present

## 2016-05-21 ENCOUNTER — Telehealth: Payer: Self-pay | Admitting: Gastroenterology

## 2016-05-21 ENCOUNTER — Other Ambulatory Visit: Payer: Self-pay | Admitting: Gastroenterology

## 2016-05-21 DIAGNOSIS — Z1211 Encounter for screening for malignant neoplasm of colon: Secondary | ICD-10-CM

## 2016-05-21 LAB — COLOGUARD: Cologuard: NEGATIVE

## 2016-05-21 NOTE — Telephone Encounter (Signed)
Caryl Pina, have you seen these results?

## 2016-05-22 ENCOUNTER — Telehealth: Payer: Self-pay | Admitting: Cardiovascular Disease

## 2016-05-22 NOTE — Telephone Encounter (Signed)
Medication samples have been provided to the patient.  Drug name: eliquis 5mg   Qty: 3 boxes = 42 tablets  LOT: [id number]  Exp.Date: 03/2018  Samples left at front desk for patient pick-up. Patient notified - he is aware he is due for an appointment w/[name] and was instructed to make an appointment when he comes to pick up samples.   Fidel Levy 10:55 AM 05/22/2016

## 2016-05-22 NOTE — Telephone Encounter (Signed)
Patient calling the office for samples of medication:   1.  What medication and dosage are you requesting samples for? Eliquis 5mg   He take 2 a day  2.  Are you currently out of this medication? Not quite

## 2016-05-23 ENCOUNTER — Telehealth: Payer: Self-pay

## 2016-05-23 DIAGNOSIS — J320 Chronic maxillary sinusitis: Secondary | ICD-10-CM | POA: Diagnosis not present

## 2016-05-23 DIAGNOSIS — J322 Chronic ethmoidal sinusitis: Secondary | ICD-10-CM | POA: Diagnosis not present

## 2016-05-23 DIAGNOSIS — H838X3 Other specified diseases of inner ear, bilateral: Secondary | ICD-10-CM | POA: Diagnosis not present

## 2016-05-23 DIAGNOSIS — H9313 Tinnitus, bilateral: Secondary | ICD-10-CM | POA: Diagnosis not present

## 2016-05-23 DIAGNOSIS — J310 Chronic rhinitis: Secondary | ICD-10-CM | POA: Diagnosis not present

## 2016-05-23 NOTE — Telephone Encounter (Signed)
-----   Message from Manus Gunning, MD sent at 05/21/2016 12:12 PM EDT ----- Caryl Pina can you please tell this patient his Cologuard is negative, which is excellent news. He does not need a colonoscopy at this time. Cologuard is valid for 3 years. I don't think he will need further screening at that time when he is due again, but he can see me in clinic at that time if he wishes.   Thanks

## 2016-05-23 NOTE — Telephone Encounter (Signed)
Pt informed of results and that they were good for 3 years. Instructed he could follow up then with [name] if he wishes.

## 2016-05-23 NOTE — Telephone Encounter (Signed)
Left message for pt to return call. Results are negative and are good for 3 years. Pt can come in to the office for a visit at that time per [name].

## 2016-06-01 ENCOUNTER — Ambulatory Visit (INDEPENDENT_AMBULATORY_CARE_PROVIDER_SITE_OTHER): Payer: Medicare Other | Admitting: *Deleted

## 2016-06-01 ENCOUNTER — Telehealth: Payer: Self-pay

## 2016-06-01 ENCOUNTER — Other Ambulatory Visit: Payer: Self-pay | Admitting: Gastroenterology

## 2016-06-01 DIAGNOSIS — I255 Ischemic cardiomyopathy: Secondary | ICD-10-CM

## 2016-06-01 DIAGNOSIS — Z95810 Presence of automatic (implantable) cardiac defibrillator: Secondary | ICD-10-CM

## 2016-06-01 DIAGNOSIS — I5022 Chronic systolic (congestive) heart failure: Secondary | ICD-10-CM | POA: Diagnosis not present

## 2016-06-01 NOTE — Telephone Encounter (Signed)
Pt was prescribed 20mg  of Omeprazole BID. Ins will only cover qd. Do you want to try Omeprazole 40qd?  [facility] on [address]

## 2016-06-01 NOTE — Progress Notes (Signed)
EPIC Encounter for ICM Monitoring  Patient Name: [name] is a 76 y.o. male Date: 06/01/2016 Primary Care Physican: [name], MD Primary Cardiologist:Kelly/Bensimhon Electrophysiologist: Faustino Congress Weight:unknown      Heart Failure questions reviewed, pt asymptomatic    Thoracic impedance normal.  Prescribed and confirmed dosage: Torsemide 20 mg 1 tablet twice a day  Recommendations: No changes.  Encouraged to call for fluid symptoms.  Follow-up plan: ICM clinic phone appointment on 07/02/2016.  Office appointment scheduled on 06/12/2016 with Dr [name].  Copy of ICM check sent to device physician.   3 month ICM trend: 06/01/2016   1 Year ICM trend:      [name], RN 06/01/2016 11:36 AM

## 2016-06-04 NOTE — Progress Notes (Signed)
Remote ICD transmission.   

## 2016-06-05 DIAGNOSIS — R972 Elevated prostate specific antigen [PSA]: Secondary | ICD-10-CM | POA: Diagnosis not present

## 2016-06-06 ENCOUNTER — Other Ambulatory Visit: Payer: Self-pay

## 2016-06-06 LAB — CUP PACEART REMOTE DEVICE CHECK
Battery Voltage: 2.62 V
Brady Statistic RV Percent Paced: 0.03 %
Date Time Interrogation Session: 20180504062709
HIGH POWER IMPEDANCE MEASURED VALUE: 52 Ohm
HighPow Impedance: 80 Ohm
Implantable Lead Location: 753860
Implantable Lead Model: 147
Lead Channel Setting Pacing Pulse Width: 0.7 ms
MDC IDC LEAD IMPLANT DT: 20010806
MDC IDC LEAD SERIAL: 104581
MDC IDC MSMT LEADCHNL RV IMPEDANCE VALUE: 1056 Ohm
MDC IDC MSMT LEADCHNL RV SENSING INTR AMPL: 15.309 mV
MDC IDC PG IMPLANT DT: 20100111
MDC IDC SET LEADCHNL RV PACING AMPLITUDE: 2.5 V
MDC IDC SET LEADCHNL RV SENSING SENSITIVITY: 0.3 mV

## 2016-06-06 MED ORDER — OMEPRAZOLE 40 MG PO CPDR: 40.0000 mg | DELAYED_RELEASE_CAPSULE | Freq: Every day | ORAL | 3 refills | Status: DC

## 2016-06-06 NOTE — Telephone Encounter (Signed)
Yes that's fine, thanks

## 2016-06-06 NOTE — Progress Notes (Unsigned)
Omeprazole 40 qd sent to [facility] on [address].

## 2016-06-11 ENCOUNTER — Telehealth: Payer: Self-pay | Admitting: Gastroenterology

## 2016-06-12 ENCOUNTER — Other Ambulatory Visit: Payer: Self-pay

## 2016-06-12 ENCOUNTER — Ambulatory Visit (INDEPENDENT_AMBULATORY_CARE_PROVIDER_SITE_OTHER): Payer: Medicare Other | Admitting: Cardiovascular Disease

## 2016-06-12 ENCOUNTER — Encounter: Payer: Self-pay | Admitting: Cardiovascular Disease

## 2016-06-12 VITALS — BP 106/58 | HR 53 | Ht 65.5 in | Wt 147.0 lb

## 2016-06-12 DIAGNOSIS — I5042 Chronic combined systolic (congestive) and diastolic (congestive) heart failure: Secondary | ICD-10-CM | POA: Diagnosis not present

## 2016-06-12 DIAGNOSIS — I480 Paroxysmal atrial fibrillation: Secondary | ICD-10-CM | POA: Diagnosis not present

## 2016-06-12 DIAGNOSIS — Z7901 Long term (current) use of anticoagulants: Secondary | ICD-10-CM

## 2016-06-12 DIAGNOSIS — L602 Onychogryphosis: Secondary | ICD-10-CM | POA: Diagnosis not present

## 2016-06-12 DIAGNOSIS — R0609 Other forms of dyspnea: Secondary | ICD-10-CM

## 2016-06-12 DIAGNOSIS — G5761 Lesion of plantar nerve, right lower limb: Secondary | ICD-10-CM | POA: Diagnosis not present

## 2016-06-12 DIAGNOSIS — I255 Ischemic cardiomyopathy: Secondary | ICD-10-CM

## 2016-06-12 DIAGNOSIS — I447 Left bundle-branch block, unspecified: Secondary | ICD-10-CM | POA: Diagnosis not present

## 2016-06-12 DIAGNOSIS — I2589 Other forms of chronic ischemic heart disease: Secondary | ICD-10-CM

## 2016-06-12 DIAGNOSIS — M79671 Pain in right foot: Secondary | ICD-10-CM | POA: Diagnosis not present

## 2016-06-12 DIAGNOSIS — E785 Hyperlipidemia, unspecified: Secondary | ICD-10-CM | POA: Diagnosis not present

## 2016-06-12 DIAGNOSIS — G5762 Lesion of plantar nerve, left lower limb: Secondary | ICD-10-CM | POA: Diagnosis not present

## 2016-06-12 DIAGNOSIS — R972 Elevated prostate specific antigen [PSA]: Secondary | ICD-10-CM | POA: Diagnosis not present

## 2016-06-12 MED ORDER — OMEPRAZOLE 20 MG PO CPDR: 20.0000 mg | DELAYED_RELEASE_CAPSULE | Freq: Every day | ORAL | 3 refills | Status: DC

## 2016-06-12 NOTE — Telephone Encounter (Signed)
Instructed pt that I sent a new script for Omeprazole 40mg  qd due to his ins not covering 20mg  BID.   Pt states that he has only been taking 20mg  daily anyway.  Sent in new script today for Omeprazole 20mg  qd a 90 day supply.  Pt has already picked up the 40mg  daily and I informed that I was not sure if his ins plan would let him have another refill. Instructed that he can do the 40mg  every other day because he is concerned about taking too much medication.

## 2016-06-12 NOTE — Progress Notes (Signed)
Patient ID: [name], male   DOB: 09/22/1940, 76 y.o.   MRN: [id number]     HPI: [name] is a 76 y.o. 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 In September 1998 he underwent CABG revascularization surgery after an unsuccessful attempt at stenting of his proximal LAD by [name]. Ejection fraction was 25-30%. In 2002 he underwent initial ICD implantation for nonsustained ventricular tachycardia documented on event monitor for primary prevention. In January 2010 he underwent generator change with a  Medtronic Virtuoso single chamber cardioverter defibrillator. Additional problems include paroxysmal atrial fibrillation, occasional PVCs and an attempt is made in the past to overdrive suppress his PVCs with reducing his beta blocker therapy.  He felt he had PVCs on the reduced dose and therefore has been on the higher dose.   A nuclear perfusion study  in November 2013 showed a large area of scar in the LAD territory (extent 44%) involving the mid to apical anterior,  apical and infero-apical to mid infero-septal and apical lateral wall without associated ischemia.  An echo Doppler study  on 01/01/2013 revealed a mildly dilated LV. Ejection fraction was 25-30%. There was dyskinesis in scarring of the apical myocardium as well as akinesis of the mid anteroseptal, anterior and anterolateral walls consistent with his prior LAD infarction. He does have a history of paroxysmal atrial fibrillation.   He  has a history of mild asthma for which he takes Singulair 10 mg in addition to Asmanex inhaler.  He has a history of hyperlipidemia and is tolerating Zocor 40 mg.  There is a remote history of gout, which he has been on allopurinol at low dose 150 mg daily without recurrence.  He is on Coumadin  anticoagulation and denies bleeding is unaware of anginal symptoms.   When I  saw him in January 2016, he had a optivol readings which were elevated from January 22 through February 22.  He had some intermittent atrial fibrillation during this time and also noted some intermittent shortness of breath, which he felt may be due to allergies.    When I saw him in March, I recommended further titration of his spironolactone to 12.5 mg twice a day and he was on a carvedilol dose that had been titrated up to 18.75 mg twice a day.  A follow-up echo Doppler study of 04/14/2014 showed an EF of 20-25% with akinesis and scarring of his LAD territory consistent with his old MI.  There was grade 2 diastolic dysfunction.  There is trivial AR and moderate Jason; moderate to severe dilation of his both atrium and mild reduction of RV pressure.   He was readmitted to the hospital from  May 2 through 06/04/2014 with recurrent atrial fibrillation.  During that hospitalization, he was started on Tikosyn 500 mg twice a day by Dr. [name].  His dose of carvedilol was reduced to 9.375 mg twice a day.  He had been enrolled in the genetic AF trial and stopped Tikosyn required for initiation of the study.  He is now on study drug which is either Bucindolol 50 mg twice a day or metoprolol XL 200 mg daily.  He believes that his AF frequency and duration are slightly last typically has a short episode of AF lasting for several hours, almost every 2 weeks.  He is followed in the AF clinic.    Since I last saw him 13 months ago, he apparently did poorly in the trial.  In the setting of titration of beta blocker therapy and was referred to the heart failure clinic.  He has been followed by Dr. Ronna Polio.  There has been discussion concerning CRT upgrade with Dr. [name]. When he was last seen in heart failure clinic March 2018, his optimal interrogation showed fluid index well below threshold.  His thoracic impedance was stable.  He continues  to be active.  He has not had any episodes of tachycardia.  He denies recent chest pain or shortness of breath.  He states he is walking a minimum of 5000 steps a day, but often may walk up to 10,000 steps.  He presents for follow-up evaluation.   Past Medical History:  Diagnosis Date  . Acute on chronic systolic CHF (congestive heart failure) ([place]) 06/09/2015  . 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 Cardiomyopathy, ischemic 2011   with EF 25-35% by echo  . Coronary artery disease    Hx MI 1992, CABG 1998 , Nuc study 11.2013 large scar but no ischemia  . GERD (gastroesophageal reflux disease)   . H. pylori infection    Hx of   . 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 NSVT (nonsustained ventricular tachycardia) ([place])   . Paroxysmal atrial fibrillation [facility])     Past Surgical History:  Procedure Laterality Date  . CATARACT EXTRACTION, BILATERAL    . CORONARY ARTERY BYPASS GRAFT  1998   x 5  . HERNIA REPAIR  1960's   right inguinal  . ICD GENERATOR CHANGE  01/2008   medtronic, hx+ EP study  . KNEE SURGERY     right  . NASAL SINUS SURGERY  05/31/2010   Dr. Benjamine Mola    Allergies  Allergen Reactions  . Amoxicillin Rash  . Penicillins Rash    Current Outpatient Prescriptions  Medication Sig Dispense Refill  . acetaminophen (TYLENOL) 650 MG CR tablet Take 650 mg by mouth 2 (two) times daily.     Marland Kitchen allopurinol (ZYLOPRIM) 300 MG tablet Take 300 mg by mouth daily.    Marland Kitchen aspirin 81 MG tablet Take 81 mg by mouth 3 (three) times a week. Monday, Wednesday, Friday    . bisoprolol (ZEBETA) 5 MG tablet Take 0.5 tablets (2.5 mg total) by mouth daily. 45 tablet 3  . cetirizine (ZYRTEC) 10 MG tablet Take 10 mg by mouth daily.      . Cholecalciferol (VITAMIN D) 2000 UNITS CAPS Take 1 capsule by mouth daily.    Marland Kitchen dicyclomine (BENTYL) 10 MG capsule Take 10 mg by mouth every 8 (eight) hours as needed (IBS).    Marland Kitchen  dicyclomine (BENTYL) 10 MG capsule Take 1 capsule (10 mg total) by mouth every 8 (eight) hours as needed for spasms. 90 capsule 1  . ELIQUIS 5 MG TABS tablet Take 1 tablet (5 mg total) by mouth 2 (two) times daily. 60 tablet 11  . esomeprazole (NEXIUM) 40 MG capsule Take 1 capsule (40 mg total) by mouth daily at 12 noon. 30 capsule 3  . isosorbide mononitrate (IMDUR) 30 MG 24 hr tablet Take 1 tablet (30 mg total) by mouth daily. PLEASE CONTACT OFFICE FOR ADDITIONAL REFILLS 90 tablet 3  . LORazepam (ATIVAN) 1 MG tablet Take 0.5 mg by mouth at bedtime.    Marland Kitchen  losartan (COZAAR) 25 MG tablet Take 1 tablet (25 mg total) by mouth at bedtime. 90 tablet 3  . magnesium oxide (MAG-OX) 400 MG tablet Take 400 mg by mouth daily.    . metolazone (ZAROXOLYN) 2.5 MG tablet Take 1 tablet (2.5 mg total) by mouth daily as needed (if weight 135 lbs or more). Take 40 meq potassium if you take this pill. 2 tablet 0  . mometasone (ASMANEX) 220 MCG/INH inhaler Inhale 1 puff into the lungs daily.     . montelukast (SINGULAIR) 10 MG tablet Take 1 tablet (10 mg total) by mouth at bedtime. PLEASE SCHEDULE APPOINTMENT. 30 tablet 0  . nitroGLYCERIN (NITROSTAT) 0.4 MG SL tablet Place 1 tablet (0.4 mg total) under the tongue every 5 (five) minutes as needed for chest pain. 25 tablet 1  . omeprazole (PRILOSEC) 20 MG capsule Take 1 capsule (20 mg total) by mouth daily. 180 capsule 3  . omeprazole (PRILOSEC) 40 MG capsule Take 1 capsule (40 mg total) by mouth daily. 90 capsule 3  . silodosin (RAPAFLO) 4 MG CAPS capsule Take 8 mg by mouth See admin instructions. Every 2 days    . simvastatin (ZOCOR) 40 MG tablet TAKE ONE TABLET BY MOUTH ONCE DAILY AT BEDTIME 90 tablet 2  . spironolactone (ALDACTONE) 25 MG tablet Take 1 tablet (25 mg total) by mouth daily. 90 tablet 3  . torsemide (DEMADEX) 20 MG tablet Take 1 tablet (20 mg total) by mouth 2 (two) times daily. 180 tablet 3   No current facility-administered medications for this visit.       Socially he is married and has 3 children and 5 grandchildren. He does exercise most recently with walking. He used to play tennis. There is no tobacco or alcohol use.  ROS General: Negative; No fevers, chills, or night sweats;  HEENT: Negative; No changes in vision or hearing, sinus congestion, difficulty swallowing Pulmonary:  Positive for mild asthma; No cough, wheezing, shortness of breath, hemoptysis Cardiovascular: see history of present illness  GI: History of GERD No nausea, vomiting, diarrhea, or abdominal pain GU: Negative; No dysuria, hematuria, or difficulty voiding Musculoskeletal: Negative; no myalgias, joint pain, or weakness Hematologic/Oncology: Negative; no easy bruising, bleeding Endocrine: Negative; no heat/cold intolerance; no diabetes Neuro: Negative; no changes in balance, headaches Skin: Negative; No rashes or skin lesions Psychiatric: Negative; No behavioral problems, depression Sleep: Negative; No snoring, daytime sleepiness, hypersomnolence, bruxism, restless legs, hypnogognic hallucinations, no cataplexy Other comprehensive 14 point system review is negative.   PE BP (!) 106/58   Pulse (!) 53   Ht 5' 5.5" (1.664 m)   Wt 147 lb (66.7 kg)   BMI 24.09 kg/m    Repeat blood pressure by me 02/02/1960 supine and 100/60 standing.  He is asymptomatic  Wt Readings from Last 3 Encounters:  06/12/16 147 lb (66.7 kg)  04/24/16 147 lb 6 oz (66.8 kg)  04/19/16 143 lb 8 oz (65.1 kg)  . General: Alert, oriented, no distress.  Skin: normal turgor, no rashes HEENT: Normocephalic, atraumatic. Pupils round and reactive; sclera anicteric;no lid lag,  Nose without nasal septal hypertrophy Mouth/Parynx benign; Mallinpatti scale 2 Neck: No JVD, no carotid bruits; normal carotid upstroke Chest wall: nontender to palpation Lungs: clear to ausculatation and percussion; no wheezing or rales Heart: RRR, s1 s2 normal 2/6 systolic murmur.  No diastolic murmur No S3 gallop.  No S4 gallop.No heave or parasternal lift. No rub. Abdomen: soft, nontender; no hepatosplenomehaly, BS+; abdominal aorta nontender and not dilated  by palpation. Back: No CVA tenderness Pulses 2+ Extremities: no clubbing cyanosis or edema, Homan's sign negative  Neurologic: grossly nonfocal; cranial nerves normal Psychological: Normal affect and mood.  ECG (independently read by me): Sinus bradycardia 53 bpm.  Nonspecific interventricular block.  Left axis deviation.  QTc interval 471 ms.  April 2017 ECG (independently read by me): Normal sinus rhythm with a PR interval of 174 ms.  QTc interval 466 ms.  Nonspecific interventricular block.  Nonspecific T-wave abnormalities.  2.  PVCs.  June 2016 ECG (independently read by me): Sinus bradycardia 57.  Nonspecific interventricular block with a QRS duration of 1:30 milliseconds.  Old anterolateral MI.  PR interval 154 ms, QTc interval 517 ms.  March 2016 ECG (independently read by me): Sinus rhythm with PACs.  Old anterior wall MI.  Left axis deviation.  Probable left atrial enlargement.  02/09/2014 ECG (independently read by me):  normal sinus rhythm with sinus arrhythmia at 63 bpm.  One isolated PVC.  10/06/2013 (independently read by me): Sinus rhythm at 56 beats per minute with occasional isolated PVC.  06/01/2013 ECG (independently read by me): Sinus bradycardia at 49 beats per minute.  No ectopy.  QTc interval 45 ms.  Prior ECG (independently read by me): Sinus bradycardia 52 beats per minute. Old anterior lateral myocardial infarction. Nonspecific interventricular conduction delay. Previously noted T-wave changes.  ECG from 08/28/2012: Sinus bradycardia 52 beats per minute. No ectopy. Diffuse T wave abnormality the 4 through V6 1 and L. Poor R-wave progression compatible with prior anterior wall MI.  LABS:  BMP Latest Ref Rng & Units 03/19/2016 11/30/2015 10/21/2015  Glucose 65 - 99 mg/dL 84 106(H) 100(H)  BUN 6 - 20 mg/dL 16 16 12     Creatinine 0.61 - 1.24 mg/dL 1.13 1.14 0.86  Sodium 135 - 145 mmol/L 135 137 140  Potassium 3.5 - 5.1 mmol/L 3.4(L) 3.7 3.5  Chloride 101 - 111 mmol/L 97(L) 100(L) 102  CO2 22 - 32 mmol/L 31 32 31  Calcium 8.9 - 10.3 mg/dL 8.6(L) 9.0 9.8       Component Value Date/Time   PROT 7.5 11/30/2015 1620   ALBUMIN 4.3 11/30/2015 1620   AST 27 11/30/2015 1620   ALT 20 11/30/2015 1620   ALKPHOS 81 11/30/2015 1620   BILITOT 0.8 11/30/2015 1620   BILIDIR 0.2 11/07/2012 1509    Hepatic Function Latest Ref Rng & Units 11/30/2015 06/10/2015 06/09/2015  Total Protein 6.5 - 8.1 g/dL 7.5 6.4(L) 6.4(L)  Albumin 3.5 - 5.0 g/dL 4.3 3.5 3.7  AST 15 - 41 U/L 27 20 23   ALT 17 - 63 U/L 20 20 22   Alk Phosphatase 38 - 126 U/L 81 52 59  Total Bilirubin 0.3 - 1.2 mg/dL 0.8 1.4(H) 1.3(H)  Bilirubin, Direct 0.0 - 0.3 mg/dL - - -     CBC Latest Ref Rng & Units 03/19/2016 11/30/2015 06/13/2015  WBC 4.0 - 10.5 K/uL 7.9 10.3 11.4(H)  Hemoglobin 13.0 - 17.0 g/dL 13.4 14.6 13.2  Hematocrit 39.0 - 52.0 % 40.3 43.9 40.9  Platelets 150 - 400 K/uL 166 265 242   Lab Results  Component Value Date   MCV 87.8 03/19/2016   MCV 87.8 11/30/2015   MCV 84.5 06/13/2015   Lab Results  Component Value Date   TSH 2.43 05/31/2015    BNP No results found for: PROBNP    Lipid Panel     Component Value Date/Time   CHOL 125 05/31/2015 0911   TRIG 107 05/31/2015  0911   HDL 34 (L) 05/31/2015 0911   CHOLHDL 3.7 05/31/2015 0911   VLDL 21 05/31/2015 0911   LDLCALC 70 05/31/2015 0911    RADIOLOGY: No results found.  IMPRESSION:  1. Hyperlipidemia LDL goal <70   2. Ischemic cardiomyopathy   3. DOE (dyspnea on exertion)   4. PAF (paroxysmal atrial fibrillation) ([place])   5. Chronic combined systolic and diastolic heart failure, NYHA class 1 ([place])   6. Long term current use of anticoagulant therapy   7. LBBB (left bundle branch block)     ASSESSMENT AND PLAN:  [name]  is a 76 year old white male who has a  history of a nonischemic cardiomyopathy secondary to suffering a a large anterior wall myocardial infarction in 1992.  His echo Doppler study from March 2016 revealed an EF in the 20-25% range and his last nuclear perfusion study in November 2013 was nonischemic.   He has had issues with paroxysmal atrial fibrillation.  This did improve with resumption of amiodarone but he had been taken off this by Dr. [name]. He had hospitalizations for recurrent AF and Tikosyn had been initiated.  He was enrolled in the genetic AF trial and for the past year has been followed by Dr. Ronna Polio and advanced heart failure clinic.  At present, he seems well compensated with reference to his heart failure concurrently is on bisoprolol 2.5 mg daily, isosorbide 30 mg, losartan 25 mg, spironolactone 25 mg daily and torsemide 20 mg twice a day.  At present, there is no volume overload.  There is no edema.  His blood pressure is low normal and he is not orthostatic.  He continues on anticoagulation with eloquence of denies bleeding.  He has a prescription for metolazone, but has not required use.  He has been on simvastatin 40 mg with target LDL less than 70.  There has been discussion concerning possible CRT upgrade with his left bundle branch block and moderate heart failure limitation. He will be having a follow-up visit with Dr. [name].  He continues to exercise regularly and this was strongly encouraged.  He has not had recent laboratory and fasting laboratory will be obtained.  Since he is being followed closely in the advanced heart failure clinic as well as AF clinic and also will be seeing Dr. [name], as long as he remains stable I will see him in one year for reevaluation.  Time spent: 25 minutes   Troy Sine, MD, [facility]  06/14/2016 7:58 PM

## 2016-06-12 NOTE — Patient Instructions (Signed)
NO MEDICATION CHANGES    LABS - DO NOT EAT OR DRINK THE MORNING  OF TEST.  CMP  CBC TSH BNP    Your physician wants you to follow-up in 12 month with DR KELLY.You will receive a reminder letter in the mail two months in advance. If you don't receive a letter, please call our office to schedule the follow-up appointment.   If you need a refill on your cardiac medications before your next appointment, please call your pharmacy.

## 2016-06-14 ENCOUNTER — Telehealth: Payer: Self-pay | Admitting: Gastroenterology

## 2016-06-14 DIAGNOSIS — I255 Ischemic cardiomyopathy: Secondary | ICD-10-CM | POA: Diagnosis not present

## 2016-06-14 DIAGNOSIS — E785 Hyperlipidemia, unspecified: Secondary | ICD-10-CM | POA: Diagnosis not present

## 2016-06-14 DIAGNOSIS — R0602 Shortness of breath: Secondary | ICD-10-CM | POA: Diagnosis not present

## 2016-06-14 LAB — COMPREHENSIVE METABOLIC PANEL
ALBUMIN: 4.5 g/dL (ref 3.6–5.1)
ALT: 13 U/L (ref 9–46)
AST: 19 U/L (ref 10–35)
Alkaline Phosphatase: 79 U/L (ref 40–115)
BILIRUBIN TOTAL: 0.8 mg/dL (ref 0.2–1.2)
BUN: 14 mg/dL (ref 7–25)
CO2: 28 mmol/L (ref 20–31)
CREATININE: 0.86 mg/dL (ref 0.70–1.18)
Calcium: 9.7 mg/dL (ref 8.6–10.3)
Chloride: 100 mmol/L (ref 98–110)
Glucose, Bld: 87 mg/dL (ref 65–99)
Potassium: 3.9 mmol/L (ref 3.5–5.3)
SODIUM: 140 mmol/L (ref 135–146)
TOTAL PROTEIN: 7.3 g/dL (ref 6.1–8.1)

## 2016-06-14 LAB — LIPID PANEL
Cholesterol: 156 mg/dL (ref <200)
HDL: 42 mg/dL (ref >40)
LDL CALC: 90 mg/dL (ref <100)
TRIGLYCERIDES: 119 mg/dL (ref <150)
Total CHOL/HDL Ratio: 3.7 Ratio (ref <5.0)
VLDL: 24 mg/dL (ref <30)

## 2016-06-14 LAB — TSH: TSH: 3.72 mIU/L (ref 0.40–4.50)

## 2016-06-14 NOTE — Telephone Encounter (Signed)
Spoke to patient, he was given these results on 4/25. Told patient that I will mail him the results so that he has it for his records. Patient most appreciative.

## 2016-06-15 LAB — BRAIN NATRIURETIC PEPTIDE: Brain Natriuretic Peptide: 384.4 pg/mL — ABNORMAL HIGH (ref <100)

## 2016-06-19 ENCOUNTER — Telehealth: Payer: Self-pay | Admitting: Cardiovascular Disease

## 2016-06-19 NOTE — Telephone Encounter (Signed)
New message     Patient calling the office for samples of medication:   1.  What medication and dosage are you requesting samples for?  eliquis 5 2x a day   2.  Are you currently out of this medication? Yes   Also would like a call regarding his blood work

## 2016-06-19 NOTE — Telephone Encounter (Signed)
Medication samples have been provided to the patient.  Drug name: Eliquis 5mg   Qty: 14 day supply LOT: [id number]  Exp.Date: 10/20  Samples left at front desk for patient pick-up. Patient notified.   Patient request labwork be mailed to him once reviewed by [name].

## 2016-07-02 ENCOUNTER — Telehealth: Payer: Self-pay

## 2016-07-02 ENCOUNTER — Ambulatory Visit (INDEPENDENT_AMBULATORY_CARE_PROVIDER_SITE_OTHER): Payer: Medicare Other

## 2016-07-02 ENCOUNTER — Encounter: Payer: Self-pay | Admitting: *Deleted

## 2016-07-02 DIAGNOSIS — I5042 Chronic combined systolic (congestive) and diastolic (congestive) heart failure: Secondary | ICD-10-CM | POA: Diagnosis not present

## 2016-07-02 DIAGNOSIS — Z79899 Other long term (current) drug therapy: Secondary | ICD-10-CM | POA: Diagnosis not present

## 2016-07-02 DIAGNOSIS — M25561 Pain in right knee: Secondary | ICD-10-CM | POA: Diagnosis not present

## 2016-07-02 DIAGNOSIS — I509 Heart failure, unspecified: Secondary | ICD-10-CM | POA: Diagnosis not present

## 2016-07-02 DIAGNOSIS — Z95810 Presence of automatic (implantable) cardiac defibrillator: Secondary | ICD-10-CM | POA: Diagnosis not present

## 2016-07-02 DIAGNOSIS — M109 Gout, unspecified: Secondary | ICD-10-CM | POA: Diagnosis not present

## 2016-07-02 NOTE — Telephone Encounter (Signed)
ICM call to patient.  He stated he needs some samples of Eliquis 5 mg.  Advised would send the request to the medication refill department and they will call him if they have any samples.

## 2016-07-02 NOTE — Telephone Encounter (Signed)
Spoke with pt, aware no samples available. 

## 2016-07-02 NOTE — Progress Notes (Signed)
EPIC Encounter for ICM Monitoring  Patient Name: [name] is a 76 y.o. male Date: 07/02/2016 Primary Care Physican: [name], MD Primary Cardiologist:Kelly/Bensimhon Electrophysiologist: Faustino Congress Weight:unknown      Heart Failure questions reviewed, pt asymptomatic.  He is currently taking a week of Prednisone for gout in his wrist.  He requested to have another transmission reviewed on 07/09/2016 to make sure the Prednisone has not caused any fluid retention.    Thoracic impedance normal.   Prescribed dosage: Torsemide 20 mg 1 tablet twice a day  Recommendations: No changes. Discussed to limit salt intake to 2000 mg/day and fluid intake to < 2 liters/day.  Encouraged to call for fluid symptoms or use local ER for any urgent symptoms.  Follow-up plan: ICM clinic phone appointment on 07/09/2016 and 09/03/2016.  Office appointment scheduled on 07/31/2016 with Dr. [name].  Copy of ICM check sent to device physician.   3 month ICM trend: 07/02/2016   1 Year ICM trend:      [name], RN 07/02/2016 8:42 AM

## 2016-07-04 ENCOUNTER — Telehealth: Payer: Self-pay | Admitting: Internal Medicine

## 2016-07-04 NOTE — Telephone Encounter (Signed)
New message    Pt is calling.    Patient calling the office for samples of medication:   1.  What medication and dosage are you requesting samples for? Eliquis 5 mg  2.  Are you currently out of this medication? Pt said he has maybe 5 days.

## 2016-07-04 NOTE — Telephone Encounter (Signed)
Medication samples have been provided to the patient.  Drug name: Eliquis 5mg   Qty: 14 day supply LOT: [id number]  Exp.Date: 10/20  Samples left at front desk for patient pick-up. Patient notified.

## 2016-07-09 ENCOUNTER — Ambulatory Visit (INDEPENDENT_AMBULATORY_CARE_PROVIDER_SITE_OTHER): Payer: Medicare Other

## 2016-07-09 ENCOUNTER — Telehealth: Payer: Self-pay

## 2016-07-09 DIAGNOSIS — I5042 Chronic combined systolic (congestive) and diastolic (congestive) heart failure: Secondary | ICD-10-CM

## 2016-07-09 DIAGNOSIS — Z95810 Presence of automatic (implantable) cardiac defibrillator: Secondary | ICD-10-CM

## 2016-07-09 NOTE — Progress Notes (Signed)
EPIC Encounter for ICM Monitoring  Patient Name: [name] is a 76 y.o. male Date: 07/09/2016 Primary Care Physican: [name], MD Primary Cardiologist:Kelly/Bensimhon Electrophysiologist: Faustino Congress Weight:unknown           Attempted call to patient and unable to reach.  Left message to return call. Transmission reviewed. .   Thoracic impedance normal.  Prescribed dosage: Torsemide 20 mg 1 tablet twice a day  Recommendations: NONE - Unable to reach patient   Follow-up plan: ICM clinic phone appointment on 09/03/2016.  Office appointment scheduled on 07/31/2016 with Dr. [name].  Copy of ICM check sent to device physician.   3 month ICM trend: 07/09/2016   1 Year ICM trend:      [name], RN 07/09/2016 11:37 AM

## 2016-07-09 NOTE — Telephone Encounter (Signed)
Remote ICM transmission received.  Attempted patient call and left message to return call.   

## 2016-07-10 NOTE — Progress Notes (Signed)
Patient returned call.  He said he is doing great, finished all the prednisone he was taking.  Transmission reviewed and no changes today.  Advised since he has an office appointment with Dr [name] 07/31/2016 then the next ICM remote transmission is scheduled for 09/03/2016.  Advised to call for any fluid symptoms.

## 2016-07-16 DIAGNOSIS — L820 Inflamed seborrheic keratosis: Secondary | ICD-10-CM | POA: Diagnosis not present

## 2016-07-23 ENCOUNTER — Telehealth: Payer: Self-pay | Admitting: Cardiovascular Disease

## 2016-07-23 NOTE — Telephone Encounter (Signed)
°  New Prob   Patient calling the office for samples of medication:   1.  What medication and dosage are you requesting samples for?  Eliquis  2.  Are you currently out of this medication?  Has about 4 days worth left

## 2016-07-24 MED ORDER — ELIQUIS 5 MG PO TABS: 5.0000 mg | ORAL_TABLET | Freq: Two times a day (BID) | ORAL | 11 refills | Status: DC

## 2016-07-24 NOTE — Telephone Encounter (Signed)
Spoke with pt, aware none available today. Script mailed to patient at his request.

## 2016-07-31 ENCOUNTER — Ambulatory Visit (INDEPENDENT_AMBULATORY_CARE_PROVIDER_SITE_OTHER): Payer: Medicare Other | Admitting: Internal Medicine

## 2016-07-31 ENCOUNTER — Encounter: Payer: Self-pay | Admitting: Internal Medicine

## 2016-07-31 VITALS — BP 100/50 | HR 57 | Ht 67.0 in | Wt 145.0 lb

## 2016-07-31 DIAGNOSIS — I255 Ischemic cardiomyopathy: Secondary | ICD-10-CM | POA: Diagnosis not present

## 2016-07-31 DIAGNOSIS — I2589 Other forms of chronic ischemic heart disease: Secondary | ICD-10-CM | POA: Diagnosis not present

## 2016-07-31 DIAGNOSIS — Z95810 Presence of automatic (implantable) cardiac defibrillator: Secondary | ICD-10-CM | POA: Diagnosis not present

## 2016-07-31 DIAGNOSIS — R001 Bradycardia, unspecified: Secondary | ICD-10-CM | POA: Diagnosis not present

## 2016-07-31 DIAGNOSIS — I5022 Chronic systolic (congestive) heart failure: Secondary | ICD-10-CM | POA: Diagnosis not present

## 2016-07-31 DIAGNOSIS — I480 Paroxysmal atrial fibrillation: Secondary | ICD-10-CM | POA: Diagnosis not present

## 2016-07-31 DIAGNOSIS — Z01812 Encounter for preprocedural laboratory examination: Secondary | ICD-10-CM

## 2016-07-31 LAB — CUP PACEART INCLINIC DEVICE CHECK
Date Time Interrogation Session: 20180703154635
HIGH POWER IMPEDANCE MEASURED VALUE: 49 Ohm
HIGH POWER IMPEDANCE MEASURED VALUE: 79 Ohm
Implantable Lead Location: 753860
Implantable Lead Model: 147
Implantable Lead Serial Number: 104581
Implantable Pulse Generator Implant Date: 20100111
Lead Channel Impedance Value: 944 Ohm
Lead Channel Pacing Threshold Amplitude: 1.5 V
Lead Channel Pacing Threshold Pulse Width: 0.7 ms
Lead Channel Sensing Intrinsic Amplitude: 17.971 mV
Lead Channel Setting Sensing Sensitivity: 0.3 mV
MDC IDC LEAD IMPLANT DT: 20010806
MDC IDC MSMT BATTERY VOLTAGE: 2.62 V
MDC IDC SET LEADCHNL RV PACING AMPLITUDE: 2.5 V
MDC IDC SET LEADCHNL RV PACING PULSEWIDTH: 0.7 ms
MDC IDC STAT BRADY RV PERCENT PACED: 0.08 %

## 2016-07-31 MED ORDER — ELIQUIS 5 MG PO TABS: 5.0000 mg | ORAL_TABLET | Freq: Two times a day (BID) | ORAL | 11 refills | Status: DC

## 2016-07-31 NOTE — Patient Instructions (Addendum)
Medication Instructions: - Your physician recommends that you continue on your current medications as directed. Please refer to the Current Medication list given to you today.  Labwork: - Your physician recommends that you return for lab work: Monday 09/03/16 (7:30 am-4:30 pm)- BMP/CBC  Procedures/Testing: - Your physician has recommended that you have a Bi-V/ CRT defibrillator upgrade.  Monday 09/10/16 at 9:30 am with Dr. [name] (you will arrive at 7:30 am) ** Dr. Olin Pia nurse, Nira Conn, will mail you a detailed letter of instructions**  Follow-Up: - Your physician recommends that you schedule a follow-up appointment in: 10 -14 days (from 09/10/16) for a wound check appointment with the Device Nurse.   Any Additional Special Instructions Will Be Listed Below (If Applicable).     If you need a refill on your cardiac medications before your next appointment, please call your pharmacy.

## 2016-07-31 NOTE — Progress Notes (Signed)
Patient Care Team: [name], MD as PCP - General (Family Medicine)   HPI  [name] is a 76 y.o. 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 He has a history of bypass surgery.  His last nuclear perfusion study was in November 2013 which showed a large area of scar in the LAD territory (extent 44%) involving the mid to apical anterior, apical and infero-apical to mid infero-septal and apical lateral wall without associated ischemia.  DATE TEST    3/16    Echo   EF 20-25 %   12/17    Echo   EF 20-25 %             He has a history of paroxysmal atrial fibrillation and is on warfarin. hhe's hospitalized 5/16 and was started on dofetilide. This was subsequently discontinued  Thankfully he has held sinus rhythm.   He was subsequently started on bucindolol as part of the genetics AF trial; this was also stopped.  He continues to struggle with exertional dyspnea; he has not had chest pain. He does not have peripheral edema.  There is been no recent evaluation of ischemic burden.   Past Medical History:  Diagnosis Date  . Acute on chronic systolic CHF (congestive heart failure) ([place]) 06/09/2015  . 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 Cardiomyopathy, ischemic 2011   with EF 25-35% by echo  . Coronary artery disease    Hx MI 1992, CABG 1998 , Nuc study 11.2013 large scar but no ischemia  . GERD (gastroesophageal reflux disease)   . H. pylori infection    Hx of   . 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 NSVT (nonsustained ventricular tachycardia) ([place])   . Paroxysmal atrial fibrillation [facility])     Past Surgical History:  Procedure Laterality Date  . CATARACT EXTRACTION, BILATERAL    . CORONARY ARTERY BYPASS GRAFT  1998   x 5  . HERNIA REPAIR  1960's   right inguinal  . ICD GENERATOR CHANGE  01/2008   medtronic, hx+ EP  study  . KNEE SURGERY     right  . NASAL SINUS SURGERY  05/31/2010   Dr. Benjamine Mola    Current Outpatient Prescriptions  Medication Sig Dispense Refill  . acetaminophen (TYLENOL) 650 MG CR tablet Take 650 mg by mouth 2 (two) times daily.     Marland Kitchen allopurinol (ZYLOPRIM) 300 MG tablet Take 300 mg by mouth daily.    Marland Kitchen aspirin 81 MG tablet Take 81 mg by mouth 3 (three) times a week. Monday, Wednesday, Friday    . bisoprolol (ZEBETA) 5 MG tablet Take 0.5 tablets (2.5 mg total) by mouth daily. 45 tablet 3  . cetirizine (ZYRTEC) 10 MG tablet Take 10 mg by mouth daily.      . Cholecalciferol (VITAMIN D) 2000 UNITS CAPS Take 1 capsule by mouth daily.    Marland Kitchen dicyclomine (BENTYL) 10 MG capsule Take 10 mg by mouth every 8 (eight) hours as needed (IBS).    Marland Kitchen dicyclomine (BENTYL) 10 MG capsule Take 1 capsule (10 mg total) by mouth every 8 (eight) hours as needed for spasms. 90 capsule 1  . ELIQUIS 5 MG TABS tablet Take 1 tablet (5 mg total) by mouth 2 (two) times daily. 60 tablet 11  . esomeprazole (NEXIUM) 40 MG capsule Take 1 capsule (40  mg total) by mouth daily at 12 noon. 30 capsule 3  . isosorbide mononitrate (IMDUR) 30 MG 24 hr tablet Take 1 tablet (30 mg total) by mouth daily. PLEASE CONTACT OFFICE FOR ADDITIONAL REFILLS 90 tablet 3  . LORazepam (ATIVAN) 1 MG tablet Take 0.5 mg by mouth at bedtime.    Marland Kitchen losartan (COZAAR) 25 MG tablet Take 1 tablet (25 mg total) by mouth at bedtime. 90 tablet 3  . magnesium oxide (MAG-OX) 400 MG tablet Take 400 mg by mouth daily.    . metolazone (ZAROXOLYN) 2.5 MG tablet Take 1 tablet (2.5 mg total) by mouth daily as needed (if weight 135 lbs or more). Take 40 meq potassium if you take this pill. 2 tablet 0  . mometasone (ASMANEX) 220 MCG/INH inhaler Inhale 1 puff into the lungs daily.     . montelukast (SINGULAIR) 10 MG tablet Take 1 tablet (10 mg total) by mouth at bedtime. PLEASE SCHEDULE APPOINTMENT. 30 tablet 0  . nitroGLYCERIN (NITROSTAT) 0.4 MG SL tablet Place 1 tablet  (0.4 mg total) under the tongue every 5 (five) minutes as needed for chest pain. 25 tablet 1  . omeprazole (PRILOSEC) 20 MG capsule Take 1 capsule (20 mg total) by mouth daily. 180 capsule 3  . omeprazole (PRILOSEC) 40 MG capsule Take 1 capsule (40 mg total) by mouth daily. 90 capsule 3  . silodosin (RAPAFLO) 4 MG CAPS capsule Take 8 mg by mouth See admin instructions. Every 2 days    . simvastatin (ZOCOR) 40 MG tablet TAKE ONE TABLET BY MOUTH ONCE DAILY AT BEDTIME 90 tablet 2  . spironolactone (ALDACTONE) 25 MG tablet Take 1 tablet (25 mg total) by mouth daily. 90 tablet 3  . torsemide (DEMADEX) 20 MG tablet Take 1 tablet (20 mg total) by mouth 2 (two) times daily. 180 tablet 3   No current facility-administered medications for this visit.     Allergies  Allergen Reactions  . Amoxicillin Rash  . Penicillins Rash    Review of Systems negative except from HPI and PMH  Physical Exam BP (!) 100/50   Pulse (!) 57   Ht 5\' 7"  (1.702 m)   Wt 145 lb (65.8 kg)   SpO2 96%   BMI 22.71 kg/m  Well developed and nourished in no acute distress HENT normal Neck supple with JVP-flat Carotids brisk and full without bruits Clear Regular rate and rhythm, no murmurs or gallops Abd-soft with active BS without hepatomegaly No Clubbing cyanosis edema Skin-warm and dry A & Oriented  Grossly normal sensory and motor function So episode of her with on Thursday ECG demonstrates sinus rhythm at 56 Intervals [id number] Axis -66  Multiple ECGs were reviewed. All of them demonstrated IVCD with Q waves in lead 1, L, and there is minimal  notching in the lateral precordium   IVCD Assessment and  Plan  Atrial fibrillation-paroxysmal  Sinus bradycardia  Ischemic cardiac myopathy  IVCD  Congestive heart failure-chronic-systolic  Defibrillator-Medtronic-VVI  The patient's sinus node function is worsening. When he uses his FitBit he sees his heart rates only into the high 50s or 60s.  At the  point of device upgrade, it is reasonable to plate implant a dual-chamber device.  His IVCD is in a class IIa-B recommendation for CRT upgrade. Papers suggesting a long Q-LV  are seen more likely with lateral notching. This is minimal. we could attempt His bundle pacing and/or CRT but we will activate them sequentially at the time of implantation  We have reviewed the benefits and risks of generator replacement.  These include but are not limited to lead fracture and infection.  The patient understands, agrees and is willing to proceed.    On Anticoagulation;  No bleeding issues

## 2016-08-03 ENCOUNTER — Encounter: Payer: Self-pay | Admitting: *Deleted

## 2016-08-07 ENCOUNTER — Telehealth: Payer: Self-pay | Admitting: Cardiovascular Disease

## 2016-08-07 NOTE — Telephone Encounter (Signed)
New Message ° °Patient calling the office for samples of medication: ° ° °1.  What medication and dosage are you requesting samples for? Eliquis 5mg  ° °2.  Are you currently out of this medication? Yes  ° ° ° ° °

## 2016-08-08 MED ORDER — ELIQUIS 5 MG PO TABS: 5.0000 mg | ORAL_TABLET | Freq: Two times a day (BID) | ORAL | 0 refills | Status: DC

## 2016-08-08 NOTE — Telephone Encounter (Signed)
Spoke with patient and informed that samples would be placed up front. Patient voiced understanding.

## 2016-08-20 DIAGNOSIS — I10 Essential (primary) hypertension: Secondary | ICD-10-CM | POA: Diagnosis not present

## 2016-08-20 DIAGNOSIS — E7800 Pure hypercholesterolemia, unspecified: Secondary | ICD-10-CM | POA: Diagnosis not present

## 2016-08-20 DIAGNOSIS — F419 Anxiety disorder, unspecified: Secondary | ICD-10-CM | POA: Diagnosis not present

## 2016-08-20 DIAGNOSIS — F5101 Primary insomnia: Secondary | ICD-10-CM | POA: Diagnosis not present

## 2016-08-20 DIAGNOSIS — I480 Paroxysmal atrial fibrillation: Secondary | ICD-10-CM | POA: Diagnosis not present

## 2016-08-27 ENCOUNTER — Telehealth: Payer: Self-pay | Admitting: Cardiovascular Disease

## 2016-08-27 NOTE — Telephone Encounter (Signed)
Returned call to patient regarding AF. He states he felt OK - his heart rhythm was irregular and HR was faster than his normal 55-60. He did not have shortness of breath and/or chest pain, just "uncomforable". He checked his BP/HR when he called in and it was 94/56 and HR 65 (12:30pm) and he checked while we were talking BP 93/52 HR 61 (1:10pm).  Patient wanted to know if he could take extra 1/2 tablet of bisoprolol if he has AF episode again. Explained that my concern would be that extra half could lower his already low BP more and lower his HR.    Patient had some questions regarding his torsemide dose/frequency and wanted to know if this could/needed to be adjusted - was unclear as to the reason why he is requesting this med review. Advised would have to send message to Dr. [name] regarding this.

## 2016-08-27 NOTE — Telephone Encounter (Signed)
New message        Patient c/o Palpitations:  High priority if patient c/o lightheadedness and shortness of breath.  1. How long have you been having palpitations?  Pt states that he was in AFIB from 3am until just about an hour ago 2. Are you currently experiencing lightheadedness and shortness of breath? No other symptoms  3. Have you checked your BP and heart rate? (document readings)  Heart rate was 80-90 (normally 55-60).  While on the phone pt took his bp/heart rate and it was 94/56 pulse 65  4. Are you experiencing any other symptoms? No Pt want to know if he can take an extra 1/2 pill of bisoprolol it heart rate goes up again?  Please advise

## 2016-08-29 ENCOUNTER — Other Ambulatory Visit: Payer: Self-pay | Admitting: Cardiovascular Disease

## 2016-09-03 ENCOUNTER — Other Ambulatory Visit: Payer: Medicare Other | Admitting: *Deleted

## 2016-09-03 ENCOUNTER — Ambulatory Visit (INDEPENDENT_AMBULATORY_CARE_PROVIDER_SITE_OTHER): Payer: Medicare Other | Admitting: *Deleted

## 2016-09-03 DIAGNOSIS — I5022 Chronic systolic (congestive) heart failure: Secondary | ICD-10-CM

## 2016-09-03 DIAGNOSIS — Z95810 Presence of automatic (implantable) cardiac defibrillator: Secondary | ICD-10-CM

## 2016-09-03 DIAGNOSIS — I255 Ischemic cardiomyopathy: Secondary | ICD-10-CM | POA: Diagnosis not present

## 2016-09-03 DIAGNOSIS — I480 Paroxysmal atrial fibrillation: Secondary | ICD-10-CM

## 2016-09-03 DIAGNOSIS — Z01812 Encounter for preprocedural laboratory examination: Secondary | ICD-10-CM

## 2016-09-03 LAB — CBC WITH DIFFERENTIAL/PLATELET
BASOS: 1 %
Basophils Absolute: 0.1 10*3/uL (ref 0.0–0.2)
EOS (ABSOLUTE): 0.5 10*3/uL — ABNORMAL HIGH (ref 0.0–0.4)
Eos: 6 %
Hematocrit: 40.1 % (ref 37.5–51.0)
Hemoglobin: 14.2 g/dL (ref 13.0–17.7)
IMMATURE GRANULOCYTES: 0 %
Immature Grans (Abs): 0 10*3/uL (ref 0.0–0.1)
Lymphocytes Absolute: 1.8 10*3/uL (ref 0.7–3.1)
Lymphs: 23 %
MCH: 29.5 pg (ref 26.6–33.0)
MCHC: 35.4 g/dL (ref 31.5–35.7)
MCV: 83 fL (ref 79–97)
MONOS ABS: 0.8 10*3/uL (ref 0.1–0.9)
Monocytes: 10 %
NEUTROS ABS: 4.6 10*3/uL (ref 1.4–7.0)
NEUTROS PCT: 60 %
Platelets: 241 10*3/uL (ref 150–379)
RBC: 4.81 x10E6/uL (ref 4.14–5.80)
RDW: 13.3 % (ref 12.3–15.4)
WBC: 7.8 10*3/uL (ref 3.4–10.8)

## 2016-09-03 LAB — BASIC METABOLIC PANEL
BUN/Creatinine Ratio: 12 (ref 10–24)
BUN: 12 mg/dL (ref 8–27)
CALCIUM: 8.9 mg/dL (ref 8.6–10.2)
CO2: 25 mmol/L (ref 20–29)
CREATININE: 0.98 mg/dL (ref 0.76–1.27)
Chloride: 98 mmol/L (ref 96–106)
GFR, EST AFRICAN AMERICAN: 86 mL/min/{1.73_m2} (ref >59)
GFR, EST NON AFRICAN AMERICAN: 75 mL/min/{1.73_m2} (ref >59)
Glucose: 103 mg/dL — ABNORMAL HIGH (ref 65–99)
Potassium: 3.4 mmol/L — ABNORMAL LOW (ref 3.5–5.2)
SODIUM: 137 mmol/L (ref 134–144)

## 2016-09-03 NOTE — Progress Notes (Signed)
ICD Remote Transmission received  

## 2016-09-03 NOTE — Telephone Encounter (Signed)
Spoke w/pt, he states he is ok to continue is Torsemide 20 mg BID for now, he states he just thought it might be too much, he denies dizziness/lightedness, will continue current dose.  Pt is sch for f/u on 9/5 and will make sure to keep that appointment

## 2016-09-03 NOTE — Progress Notes (Signed)
EPIC Encounter for ICM Monitoring  Patient Name: [name] is a 76 y.o. male Date: 09/03/2016 Primary Care Physican: [name], MD Primary Cardiologist:Kelly/Bensimhon Electrophysiologist: Faustino Congress Weight:unknown      Heart Failure questions reviewed, pt asymptomatic.   Patient has change of device on 09/10/2016 and will have a dual-chamber device implanted  Thoracic impedance normal.  Prescribed dosage: Torsemide 20 mg 1 tablet twice a day  Recommendations: No changes.   Encouraged to call for fluid symptoms.  Follow-up plan: ICM clinic phone appointment on 10/18/2016.  Explained It will take 6-8 weeks for fluid level baseline to develop.   Copy of ICM check sent to device physician.   3 month ICM trend: 09/03/2016   1 Year ICM trend:      [name], RN 09/03/2016 5:25 PM

## 2016-09-10 ENCOUNTER — Ambulatory Visit (HOSPITAL_COMMUNITY)
Admission: RE | Disposition: A | Discharge status: Home / Self Care | Payer: Self-pay | Source: Ambulatory Visit | Attending: Internal Medicine

## 2016-09-10 ENCOUNTER — Ambulatory Visit (HOSPITAL_COMMUNITY)
Admission: RE | Admit: 2016-09-10 | Discharge: 2016-09-11 | Disposition: A | Discharge status: Home / Self Care | Payer: Medicare Other | Source: Ambulatory Visit | Attending: Internal Medicine | Admitting: Internal Medicine

## 2016-09-10 ENCOUNTER — Encounter (HOSPITAL_COMMUNITY): Payer: Self-pay | Admitting: Internal Medicine

## 2016-09-10 DIAGNOSIS — Z95810 Presence of automatic (implantable) cardiac defibrillator: Secondary | ICD-10-CM | POA: Diagnosis present

## 2016-09-10 DIAGNOSIS — Z880 Allergy status to penicillin: Secondary | ICD-10-CM | POA: Insufficient documentation

## 2016-09-10 DIAGNOSIS — I252 Old myocardial infarction: Secondary | ICD-10-CM | POA: Diagnosis not present

## 2016-09-10 DIAGNOSIS — Z79899 Other long term (current) drug therapy: Secondary | ICD-10-CM | POA: Diagnosis not present

## 2016-09-10 DIAGNOSIS — I5020 Unspecified systolic (congestive) heart failure: Secondary | ICD-10-CM | POA: Diagnosis not present

## 2016-09-10 DIAGNOSIS — I5022 Chronic systolic (congestive) heart failure: Secondary | ICD-10-CM | POA: Diagnosis not present

## 2016-09-10 DIAGNOSIS — I480 Paroxysmal atrial fibrillation: Secondary | ICD-10-CM | POA: Diagnosis not present

## 2016-09-10 DIAGNOSIS — Z4502 Encounter for adjustment and management of automatic implantable cardiac defibrillator: Secondary | ICD-10-CM

## 2016-09-10 DIAGNOSIS — I495 Sick sinus syndrome: Secondary | ICD-10-CM | POA: Insufficient documentation

## 2016-09-10 DIAGNOSIS — Z7901 Long term (current) use of anticoagulants: Secondary | ICD-10-CM | POA: Diagnosis not present

## 2016-09-10 DIAGNOSIS — Z45018 Encounter for adjustment and management of other part of cardiac pacemaker: Principal | ICD-10-CM | POA: Insufficient documentation

## 2016-09-10 DIAGNOSIS — I509 Heart failure, unspecified: Secondary | ICD-10-CM

## 2016-09-10 DIAGNOSIS — I2510 Atherosclerotic heart disease of native coronary artery without angina pectoris: Secondary | ICD-10-CM | POA: Insufficient documentation

## 2016-09-10 DIAGNOSIS — Z7982 Long term (current) use of aspirin: Secondary | ICD-10-CM | POA: Insufficient documentation

## 2016-09-10 DIAGNOSIS — R001 Bradycardia, unspecified: Secondary | ICD-10-CM

## 2016-09-10 DIAGNOSIS — Z959 Presence of cardiac and vascular implant and graft, unspecified: Secondary | ICD-10-CM

## 2016-09-10 DIAGNOSIS — I255 Ischemic cardiomyopathy: Secondary | ICD-10-CM | POA: Diagnosis not present

## 2016-09-10 HISTORY — PX: BIV UPGRADE: EP1202

## 2016-09-10 LAB — POTASSIUM: Potassium: 3.3 mmol/L — ABNORMAL LOW (ref 3.5–5.1)

## 2016-09-10 LAB — SURGICAL PCR SCREEN
MRSA, PCR: POSITIVE — AB
Staphylococcus aureus: POSITIVE — AB

## 2016-09-10 SURGERY — BIV UPGRADE

## 2016-09-10 MED ORDER — LIDOCAINE HCL (PF) 1 % IJ SOLN
INTRAMUSCULAR | Status: AC
  Filled 2016-09-10: qty 60

## 2016-09-10 MED ORDER — TORSEMIDE 20 MG PO TABS
20.0000 mg | ORAL_TABLET | Freq: Two times a day (BID) | ORAL | Status: DC
  Administered 2016-09-10 – 2016-09-11 (×2): 20 mg via ORAL
  Filled 2016-09-10 (×2): qty 1

## 2016-09-10 MED ORDER — CHLORHEXIDINE GLUCONATE 4 % EX LIQD: 60.0000 mL | Freq: Once | CUTANEOUS | Status: DC

## 2016-09-10 MED ORDER — IOPAMIDOL (ISOVUE-370) INJECTION 76%
INTRAVENOUS | Status: AC
  Filled 2016-09-10: qty 50

## 2016-09-10 MED ORDER — DICYCLOMINE HCL 10 MG PO CAPS
10.0000 mg | ORAL_CAPSULE | Freq: Three times a day (TID) | ORAL | Status: DC | PRN
  Administered 2016-09-10 – 2016-09-11 (×2): 10 mg via ORAL
  Filled 2016-09-10 (×2): qty 1

## 2016-09-10 MED ORDER — ISOSORBIDE MONONITRATE ER 30 MG PO TB24
30.0000 mg | ORAL_TABLET | Freq: Every day | ORAL | Status: DC
  Administered 2016-09-11: 30 mg via ORAL
  Filled 2016-09-10: qty 1

## 2016-09-10 MED ORDER — ALLOPURINOL 300 MG PO TABS
300.0000 mg | ORAL_TABLET | Freq: Every day | ORAL | Status: DC
  Administered 2016-09-11: 300 mg via ORAL
  Filled 2016-09-10: qty 1

## 2016-09-10 MED ORDER — PANTOPRAZOLE SODIUM 40 MG PO TBEC
40.0000 mg | DELAYED_RELEASE_TABLET | Freq: Every day | ORAL | Status: DC
  Administered 2016-09-11: 40 mg via ORAL
  Filled 2016-09-10 (×2): qty 1

## 2016-09-10 MED ORDER — MIDAZOLAM HCL 5 MG/5ML IJ SOLN
INTRAMUSCULAR | Status: AC
  Filled 2016-09-10: qty 5

## 2016-09-10 MED ORDER — SIMVASTATIN 40 MG PO TABS
40.0000 mg | ORAL_TABLET | Freq: Every day | ORAL | Status: DC
  Administered 2016-09-10: 40 mg via ORAL
  Filled 2016-09-10: qty 1

## 2016-09-10 MED ORDER — ACETAMINOPHEN ER 650 MG PO TBCR: 650.0000 mg | EXTENDED_RELEASE_TABLET | Freq: Two times a day (BID) | ORAL | Status: DC

## 2016-09-10 MED ORDER — SODIUM CHLORIDE 0.9 % IV SOLN
INTRAVENOUS | Status: DC
  Administered 2016-09-10: 09:00:00 via INTRAVENOUS

## 2016-09-10 MED ORDER — PANTOPRAZOLE SODIUM 40 MG PO TBEC: 40.0000 mg | DELAYED_RELEASE_TABLET | Freq: Every day | ORAL | Status: DC

## 2016-09-10 MED ORDER — SODIUM CHLORIDE 0.9 % IR SOLN
Status: AC
  Filled 2016-09-10: qty 2

## 2016-09-10 MED ORDER — VITAMIN D 1000 UNITS PO TABS
2000.0000 [IU] | ORAL_TABLET | Freq: Every day | ORAL | Status: DC
  Administered 2016-09-11: 2000 [IU] via ORAL
  Filled 2016-09-10: qty 2

## 2016-09-10 MED ORDER — LORAZEPAM 0.5 MG PO TABS
0.2500 mg | ORAL_TABLET | Freq: Every day | ORAL | Status: DC
  Administered 2016-09-10: 0.5 mg via ORAL
  Filled 2016-09-10: qty 1

## 2016-09-10 MED ORDER — ASPIRIN EC 81 MG PO TBEC
81.0000 mg | DELAYED_RELEASE_TABLET | ORAL | Status: DC
  Administered 2016-09-10: 81 mg via ORAL
  Filled 2016-09-10 (×2): qty 1

## 2016-09-10 MED ORDER — MONTELUKAST SODIUM 10 MG PO TABS
10.0000 mg | ORAL_TABLET | Freq: Every day | ORAL | Status: DC
  Administered 2016-09-10: 10 mg via ORAL
  Filled 2016-09-10: qty 1

## 2016-09-10 MED ORDER — LOSARTAN POTASSIUM 25 MG PO TABS
25.0000 mg | ORAL_TABLET | Freq: Every day | ORAL | Status: DC
  Administered 2016-09-10: 25 mg via ORAL
  Filled 2016-09-10: qty 1

## 2016-09-10 MED ORDER — LIDOCAINE HCL (PF) 1 % IJ SOLN
INTRAMUSCULAR | Status: DC | PRN
  Administered 2016-09-10: 25 mL via INTRADERMAL
  Administered 2016-09-10: 35 mL via INTRADERMAL

## 2016-09-10 MED ORDER — FENTANYL CITRATE (PF) 100 MCG/2ML IJ SOLN
INTRAMUSCULAR | Status: DC | PRN
  Administered 2016-09-10 (×4): 25 ug via INTRAVENOUS

## 2016-09-10 MED ORDER — MUPIROCIN 2 % EX OINT
1.0000 "application " | TOPICAL_OINTMENT | Freq: Once | CUTANEOUS | Status: AC
  Administered 2016-09-10: 1 via TOPICAL

## 2016-09-10 MED ORDER — CHLORHEXIDINE GLUCONATE CLOTH 2 % EX PADS
6.0000 | MEDICATED_PAD | Freq: Every day | CUTANEOUS | Status: DC
  Administered 2016-09-11: 6 via TOPICAL

## 2016-09-10 MED ORDER — FENTANYL CITRATE (PF) 100 MCG/2ML IJ SOLN
INTRAMUSCULAR | Status: AC
  Filled 2016-09-10: qty 2

## 2016-09-10 MED ORDER — VANCOMYCIN HCL IN DEXTROSE 1-5 GM/200ML-% IV SOLN
INTRAVENOUS | Status: AC
  Filled 2016-09-10: qty 200

## 2016-09-10 MED ORDER — SPIRONOLACTONE 25 MG PO TABS
25.0000 mg | ORAL_TABLET | Freq: Every day | ORAL | Status: DC
  Administered 2016-09-11: 25 mg via ORAL
  Filled 2016-09-10: qty 1

## 2016-09-10 MED ORDER — IOPAMIDOL (ISOVUE-370) INJECTION 76%
INTRAVENOUS | Status: DC | PRN
  Administered 2016-09-10: 12 mL via INTRAVENOUS
  Administered 2016-09-10: 20 mL via INTRAVENOUS

## 2016-09-10 MED ORDER — BISOPROLOL FUMARATE 5 MG PO TABS
2.5000 mg | ORAL_TABLET | Freq: Every day | ORAL | Status: DC
  Administered 2016-09-11: 2.5 mg via ORAL
  Filled 2016-09-10: qty 1

## 2016-09-10 MED ORDER — MUPIROCIN 2 % EX OINT
1.0000 "application " | TOPICAL_OINTMENT | Freq: Two times a day (BID) | CUTANEOUS | Status: DC
  Administered 2016-09-10 – 2016-09-11 (×2): 1 via NASAL
  Filled 2016-09-10: qty 22

## 2016-09-10 MED ORDER — VANCOMYCIN HCL IN DEXTROSE 1-5 GM/200ML-% IV SOLN
1000.0000 mg | INTRAVENOUS | Status: AC
Start: 2016-09-10 — End: 2016-09-10
  Administered 2016-09-10: 1000 mg via INTRAVENOUS

## 2016-09-10 MED ORDER — TAMSULOSIN HCL 0.4 MG PO CAPS
0.4000 mg | ORAL_CAPSULE | Freq: Every day | ORAL | Status: DC
  Filled 2016-09-10: qty 1

## 2016-09-10 MED ORDER — VITAMIN D 50 MCG (2000 UT) PO CAPS: 2000.0000 [IU] | ORAL_CAPSULE | Freq: Every day | ORAL | Status: DC

## 2016-09-10 MED ORDER — ACETAMINOPHEN 325 MG PO TABS
325.0000 mg | ORAL_TABLET | ORAL | Status: DC | PRN
  Administered 2016-09-10 – 2016-09-11 (×2): 650 mg via ORAL
  Filled 2016-09-10 (×2): qty 2

## 2016-09-10 MED ORDER — MAGNESIUM OXIDE 400 (241.3 MG) MG PO TABS
400.0000 mg | ORAL_TABLET | Freq: Every day | ORAL | Status: DC
  Administered 2016-09-11: 400 mg via ORAL
  Filled 2016-09-10: qty 1

## 2016-09-10 MED ORDER — HEPARIN (PORCINE) IN NACL 2-0.9 UNIT/ML-% IJ SOLN
INTRAMUSCULAR | Status: AC | PRN
  Administered 2016-09-10: 500 mL

## 2016-09-10 MED ORDER — MUPIROCIN 2 % EX OINT
TOPICAL_OINTMENT | CUTANEOUS | Status: AC
  Filled 2016-09-10: qty 22

## 2016-09-10 MED ORDER — MIDAZOLAM HCL 5 MG/5ML IJ SOLN
INTRAMUSCULAR | Status: DC | PRN
  Administered 2016-09-10 (×6): 1 mg via INTRAVENOUS

## 2016-09-10 MED ORDER — APIXABAN 5 MG PO TABS
5.0000 mg | ORAL_TABLET | Freq: Two times a day (BID) | ORAL | Status: DC
  Administered 2016-09-10 – 2016-09-11 (×3): 5 mg via ORAL
  Filled 2016-09-10 (×3): qty 1

## 2016-09-10 MED ORDER — SODIUM CHLORIDE 0.9 % IR SOLN
80.0000 mg | Status: AC
  Administered 2016-09-10: 80 mg

## 2016-09-10 MED ORDER — ONDANSETRON HCL 4 MG/2ML IJ SOLN: 4.0000 mg | Freq: Four times a day (QID) | INTRAMUSCULAR | Status: DC | PRN

## 2016-09-10 MED ORDER — NITROGLYCERIN 0.4 MG SL SUBL: 0.4000 mg | SUBLINGUAL_TABLET | SUBLINGUAL | Status: DC | PRN

## 2016-09-10 MED ORDER — SODIUM CHLORIDE 0.9 % IV SOLN
INTRAVENOUS | Status: AC
  Administered 2016-09-10: 14:00:00 via INTRAVENOUS

## 2016-09-10 MED ORDER — MAGNESIUM OXIDE 400 MG PO TABS: 400.0000 mg | ORAL_TABLET | Freq: Every day | ORAL | Status: DC

## 2016-09-10 MED ORDER — VANCOMYCIN HCL IN DEXTROSE 1-5 GM/200ML-% IV SOLN
1000.0000 mg | Freq: Two times a day (BID) | INTRAVENOUS | Status: AC
  Administered 2016-09-10: 1000 mg via INTRAVENOUS
  Filled 2016-09-10: qty 200

## 2016-09-10 MED ORDER — POTASSIUM CHLORIDE CRYS ER 20 MEQ PO TBCR
40.0000 meq | EXTENDED_RELEASE_TABLET | Freq: Once | ORAL | Status: AC
  Administered 2016-09-10: 40 meq via ORAL
  Filled 2016-09-10: qty 2

## 2016-09-10 SURGICAL SUPPLY — 18 items
ADAPTER SEALING SSA-EW-09 (MISCELLANEOUS) ×3 IMPLANT
BALLN ATTAIN 80 (BALLOONS) ×2
BALLN ATTAIN 80CM 6215 (BALLOONS) ×1
BALLOON ATTAIN 80 (BALLOONS) ×1 IMPLANT
CABLE SURGICAL S-101-97-12 (CABLE) ×3 IMPLANT
CATH ATTAIN COM SURV 6250V-MB2 (CATHETERS) ×3 IMPLANT
HEMOSTAT SURGICEL 2X4 FIBR (HEMOSTASIS) ×3 IMPLANT
ICD CLARIA MRI DTMA1Q1 (ICD Generator) ×3 IMPLANT
KIT ESSENTIALS PG (KITS) ×3 IMPLANT
LEAD CAPSURE NOVUS 5076-58CM (Lead) ×3 IMPLANT
LEAD QUARTET 1458Q-86CM (Lead) ×3 IMPLANT
PAD DEFIB LIFELINK (PAD) ×3 IMPLANT
SHEATH CLASSIC 7F (SHEATH) ×3 IMPLANT
SHEATH CLASSIC 9.5F (SHEATH) ×3 IMPLANT
SLITTER 6232ADJ (MISCELLANEOUS) ×3 IMPLANT
TRAY PACEMAKER INSERTION (PACKS) ×3 IMPLANT
WIRE ACUITY WHISPER EDS 4648 (WIRE) ×3 IMPLANT
WIRE HI TORQ VERSACORE-J 145CM (WIRE) ×3 IMPLANT

## 2016-09-10 NOTE — H&P (Signed)
Patient Care Team: [name], MD as PCP - General (Family Medicine)   HPI  [name] is a 76 y.o. male  Admitted for upgrade of his ICD to dual chamber and hopefully resynchronization for his IVCD  He has a history of bypass surgery.  His last nuclear perfusion study was in November 2013 which showed a large area of scar in the LAD territory (extent 44%) involving the mid to apical anterior, apical and infero-apical to mid infero-septal and apical lateral wall without associated ischemia.  DATE TEST    3/16    Echo   EF 20-25 %   12/17    Echo   EF 20-25 %             He has a history of paroxysmal atrial fibrillation and is on warfarin. hhe's hospitalized 5/16 and was started on dofetilide. This was subsequently discontinued  He has held sinus rhythm.   He was subsequently started on bucindolol as part of the genetics AF trial; this was also stopped.    He has modest exercise intolerance, though by report today, less impairing than it has been  No chest pain  No edema    Records and Results Reviewed   Past Medical History:  Diagnosis Date  . Acute on chronic systolic CHF (congestive heart failure) ([place]) 06/09/2015  . 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 Cardiomyopathy, ischemic 2011   with EF 25-35% by echo  . Coronary artery disease    Hx MI 1992, CABG 1998 , Nuc study 11.2013 large scar but no ischemia  . GERD (gastroesophageal reflux disease)   . H. pylori infection    Hx of   . 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 NSVT (nonsustained ventricular tachycardia) ([place])   . Paroxysmal atrial fibrillation [facility])     Past Surgical History:  Procedure Laterality Date  . CATARACT EXTRACTION, BILATERAL    . CORONARY ARTERY BYPASS GRAFT  1998   x 5  . HERNIA REPAIR  1960's   right inguinal  . ICD GENERATOR CHANGE  01/2008   medtronic, hx+ EP study    . KNEE SURGERY     right  . NASAL SINUS SURGERY  05/31/2010   Dr. Benjamine Mola    Current Facility-Administered Medications  Medication Dose Route Frequency Provider Last Rate Last Dose  . 0.9 %  sodium chloride infusion   Intravenous Continuous Deboraha Sprang, MD 50 mL/hr at 09/10/16 [phone]    . 0.9 %  sodium chloride infusion   Intravenous Continuous Deboraha Sprang, MD 50 mL/hr at 09/10/16 [phone]    . chlorhexidine (HIBICLENS) 4 % liquid 4 application  60 mL Topical Once Deboraha Sprang, MD      . gentamicin (GARAMYCIN) 80 mg in sodium chloride irrigation 0.9 % 500 mL irrigation  80 mg Irrigation On Call Deboraha Sprang, MD      . mupirocin ointment (BACTROBAN) 2 %           . vancomycin (VANCOCIN) IVPB 1000 mg/200 mL premix  1,000 mg Intravenous On Call Deboraha Sprang, MD        Allergies  Allergen Reactions  . Amoxicillin Rash    Has patient had a PCN reaction causing immediate rash, facial/tongue/throat swelling, SOB or lightheadedness with hypotension: Yes Has patient had a PCN reaction causing severe rash involving mucus  membranes or skin necrosis: No Has patient had a PCN reaction that required hospitalization: No Has patient had a PCN reaction occurring within the last 10 years: No If all of the above answers are "NO", then may proceed with Cephalosporin use.   Marland Kitchen Penicillins Rash    Has patient had a PCN reaction causing immediate rash, facial/tongue/throat swelling, SOB or lightheadedness with hypotension: Yes Has patient had a PCN reaction causing severe rash involving mucus membranes or skin necrosis: No Has patient had a PCN reaction that required hospitalization: No Has patient had a PCN reaction occurring within the last 10 years: No If all of the above answers are "NO", then may proceed with Cephalosporin use.     Scheduled Meds: . chlorhexidine  60 mL Topical Once  . gentamicin irrigation  80 mg Irrigation On Call  . mupirocin ointment       Continuous Infusions: .  sodium chloride 50 mL/hr at 09/10/16 0854  . sodium chloride 50 mL/hr at 09/10/16 0854  . vancomycin     PRN Meds:.   Review of Systems negative except from HPI and PMH  Physical Exam BP 112/67 (BP Location: Right Arm)   Pulse 60   Temp (!) 97.5 F (36.4 C) (Oral)   Ht 5\' 7"  (1.702 m)   Wt 138 lb 3.2 oz (62.7 kg)   SpO2 99%   BMI 21.65 kg/m  Well developed and well nourished in no acute distress HENT normal E scleral and icterus clear Neck Supple JVP flat; carotids brisk and full Clear to ausculation Pocket with superficial veins, and protruding lead (sub Q)  Regular rate and rhythm, no murmurs gallops or rub Soft with active bowel sounds No clubbing cyanosis  Edema Alert and oriented, grossly normal motor and sensory function Skin Warm and Dry  Superficial   ECG sinus 50 17/15/49 Some notching V5,6     Assessment and  Plan  Atrial fibrillation-paroxysmal  Sinus bradycardia  Ischemic cardiac myopathy  IVCD  Congestive heart failure-chronic-systolic  Defibrillator-Medtronic-VVI  We will upgrade to dual chamber device And attempt resynchronization via His pacing or late Q-LV mapping  We have reviewed the benefits and risks of generator replacement.  These include but are not limited to lead fracture and infection perforation  The patient understands, agrees and is willing to proceed.

## 2016-09-11 ENCOUNTER — Ambulatory Visit (HOSPITAL_COMMUNITY): Payer: Medicare Other

## 2016-09-11 ENCOUNTER — Other Ambulatory Visit: Payer: Self-pay | Admitting: Physician Assistant

## 2016-09-11 DIAGNOSIS — Z79899 Other long term (current) drug therapy: Secondary | ICD-10-CM | POA: Diagnosis not present

## 2016-09-11 DIAGNOSIS — Z880 Allergy status to penicillin: Secondary | ICD-10-CM | POA: Diagnosis not present

## 2016-09-11 DIAGNOSIS — I5022 Chronic systolic (congestive) heart failure: Secondary | ICD-10-CM | POA: Diagnosis not present

## 2016-09-11 DIAGNOSIS — I255 Ischemic cardiomyopathy: Secondary | ICD-10-CM | POA: Diagnosis not present

## 2016-09-11 DIAGNOSIS — I252 Old myocardial infarction: Secondary | ICD-10-CM | POA: Diagnosis not present

## 2016-09-11 DIAGNOSIS — Z45018 Encounter for adjustment and management of other part of cardiac pacemaker: Secondary | ICD-10-CM | POA: Diagnosis not present

## 2016-09-11 DIAGNOSIS — Z7901 Long term (current) use of anticoagulants: Secondary | ICD-10-CM | POA: Diagnosis not present

## 2016-09-11 DIAGNOSIS — I2510 Atherosclerotic heart disease of native coronary artery without angina pectoris: Secondary | ICD-10-CM | POA: Diagnosis not present

## 2016-09-11 DIAGNOSIS — R918 Other nonspecific abnormal finding of lung field: Secondary | ICD-10-CM | POA: Diagnosis not present

## 2016-09-11 DIAGNOSIS — I495 Sick sinus syndrome: Secondary | ICD-10-CM | POA: Diagnosis not present

## 2016-09-11 DIAGNOSIS — Z7982 Long term (current) use of aspirin: Secondary | ICD-10-CM | POA: Diagnosis not present

## 2016-09-11 DIAGNOSIS — I480 Paroxysmal atrial fibrillation: Secondary | ICD-10-CM | POA: Diagnosis not present

## 2016-09-11 MED ORDER — POTASSIUM CHLORIDE ER 10 MEQ PO TBCR: 10.0000 meq | EXTENDED_RELEASE_TABLET | Freq: Every day | ORAL | 6 refills | Status: DC

## 2016-09-11 MED ORDER — MUPIROCIN 2 % EX OINT: 1.0000 "application " | TOPICAL_OINTMENT | Freq: Two times a day (BID) | CUTANEOUS | 0 refills | Status: AC

## 2016-09-11 MED FILL — Gentamicin Sulfate Inj 40 MG/ML: INTRAMUSCULAR | Qty: 2 | Status: AC

## 2016-09-11 MED FILL — Sodium Chloride Irrigation Soln 0.9%: Qty: 500 | Status: AC

## 2016-09-11 NOTE — Progress Notes (Signed)
Discharge Planning: AVS reviewed: PCP HARRIS, WILLIAM  CM reviewed chart for discharge planning, no needs identified. Jonnie Finner RN CCM Case Mgmt phone [phone]

## 2016-09-11 NOTE — Discharge Summary (Signed)
ELECTROPHYSIOLOGY PROCEDURE DISCHARGE SUMMARY    Patient ID: [name],  MRN: [id number], DOB/AGE: 1941/01/27 76 y.o.  Admit date: 09/10/2016 Discharge date: 09/11/2016  Primary Care Physician: [name], MD  Primary Cardiologist: [name] Electrophysiologist: Dr. [name]  Primary Discharge Diagnosis:  1. ICM 2. Chronic CHF (systolic)  Secondary Discharge Diagnosis:  1. Paroxysmal AFib     CHA2DS2Vasc is at least 4, on Eliquis 2. IVCD 3. CAD, prior MI  Allergies  Allergen Reactions  . Amoxicillin Rash    Has patient had a PCN reaction causing immediate rash, facial/tongue/throat swelling, SOB or lightheadedness with hypotension: Yes Has patient had a PCN reaction causing severe rash involving mucus membranes or skin necrosis: No Has patient had a PCN reaction that required hospitalization: No Has patient had a PCN reaction occurring within the last 10 years: No If all of the above answers are "NO", then may proceed with Cephalosporin use.   Marland Kitchen Penicillins Rash    Has patient had a PCN reaction causing immediate rash, facial/tongue/throat swelling, SOB or lightheadedness with hypotension: Yes Has patient had a PCN reaction causing severe rash involving mucus membranes or skin necrosis: No Has patient had a PCN reaction that required hospitalization: No Has patient had a PCN reaction occurring within the last 10 years: No If all of the above answers are "NO", then may proceed with Cephalosporin use.      Procedures This Admission:  1.  Upgrade of a single chamber ICD to a CRT-D on 09/11/16 by Dr [name].   See procedure report for full details 2.  CXR on 09/11/16 demonstrated no pneumothorax status post device implantation.   Brief HPI: [name] is a 76 y.o. male was referred to electrophysiology in the outpatient setting for consideration of ICD implantation.  Past medical is noted above.  The patient has persistent LV dysfunction despite guideline directed  therapy, decision to upgrade his single chamber ICD tro CRT device.  Risks, benefits, and alternatives to the procedure were reviewed with the patient who wished to proceed.   Hospital Course:  The patient was admitted and underwent ICD upgrade to [place] with details as outlined above. He was monitored on telemetry overnight which demonstrated SB/SR/V pacing.  Left and right chest wounds are without hematoma, minimal ecchymosis.  The device was interrogated and found to be functioning normally.  CXR was obtained and demonstrated no pneumothorax status post device implantation.  Wound care, arm mobility, and restrictions were reviewed with the patient.  The patient was examined by Dr. [name] and considered stable for discharge to home. Will start low dose potassium replacement and check BMET at his wound check.   The patient's discharge medications include an ACE-I (losartan) and beta blocker (bisoprolol).   Physical Exam: Vitals:   09/10/16 1253 09/10/16 1334 09/10/16 2049 09/11/16 0536  BP: 112/67 (!) 107/53 (!) 111/55 110/62  Pulse: (!) 0 (!) 54 61 70  Resp: 16 16 12 18   Temp:    97.6 F (36.4 C)  TempSrc:    Oral  SpO2: 100% 100% 100% 100%  Weight:    134 lb 14.7 oz (61.2 kg)  Height:        GEN- The patient is well appearing, alert and oriented x 3 today.   HEENT: normocephalic, atraumatic; sclera clear, conjunctiva pink; hearing intact; oropharynx clear Lungs- CTA b/l, normal work of breathing.  No wheezes, rales, rhonchi Heart- RRR, no murmurs, rubs or gallops, PMI not laterally displaced GI-  soft, non-tender Extremities- no clubbing, cyanosis, or edema MS- no significant deformity or atrophy Skin- warm and dry, no rash or lesion, left and right chest without hematoma, minimal ecchymosis Psych- euthymic mood, full affect Neuro- no gross defecits  Labs:   Lab Results  Component Value Date   WBC 7.8 09/03/2016   HGB 14.2 09/03/2016   HCT 40.1 09/03/2016   MCV 83  09/03/2016   PLT 241 09/03/2016     Recent Labs Lab 09/10/16 0835  K 3.3*    Discharge Medications:  Allergies as of 09/11/2016      Reactions   Amoxicillin Rash   Has patient had a PCN reaction causing immediate rash, facial/tongue/throat swelling, SOB or lightheadedness with hypotension: Yes Has patient had a PCN reaction causing severe rash involving mucus membranes or skin necrosis: No Has patient had a PCN reaction that required hospitalization: No Has patient had a PCN reaction occurring within the last 10 years: No If all of the above answers are "NO", then may proceed with Cephalosporin use.   Penicillins Rash   Has patient had a PCN reaction causing immediate rash, facial/tongue/throat swelling, SOB or lightheadedness with hypotension: Yes Has patient had a PCN reaction causing severe rash involving mucus membranes or skin necrosis: No Has patient had a PCN reaction that required hospitalization: No Has patient had a PCN reaction occurring within the last 10 years: No If all of the above answers are "NO", then may proceed with Cephalosporin use.      Medication List    TAKE these medications   acetaminophen 650 MG CR tablet Commonly known as:  TYLENOL Take 650 mg by mouth 2 (two) times daily.   allopurinol 300 MG tablet Commonly known as:  ZYLOPRIM Take 300 mg by mouth daily.   aspirin EC 81 MG tablet Take 81 mg by mouth every Monday, Wednesday, and Friday.   bisoprolol 5 MG tablet Commonly known as:  ZEBETA Take 0.5 tablets (2.5 mg total) by mouth daily.   cetirizine 10 MG tablet Commonly known as:  ZYRTEC Take 10 mg by mouth daily.   dicyclomine 10 MG capsule Commonly known as:  BENTYL Take 1 capsule (10 mg total) by mouth every 8 (eight) hours as needed for spasms.   ELIQUIS 5 MG Tabs tablet Generic drug:  apixaban Take 1 tablet (5 mg total) by mouth 2 (two) times daily.   ICAPS AREDS 2 PO Take 1 tablet by mouth 2 (two) times daily.   isosorbide  mononitrate 30 MG 24 hr tablet Commonly known as:  IMDUR Take 1 tablet (30 mg total) by mouth daily. PLEASE CONTACT OFFICE FOR ADDITIONAL REFILLS What changed:  how much to take  additional instructions   LORazepam 1 MG tablet Commonly known as:  ATIVAN Take 0.25-0.5 mg by mouth at bedtime.   losartan 25 MG tablet Commonly known as:  COZAAR Take 1 tablet (25 mg total) by mouth at bedtime.   magnesium oxide 400 MG tablet Commonly known as:  MAG-OX Take 400 mg by mouth daily at 12 noon.   metolazone 2.5 MG tablet Commonly known as:  ZAROXOLYN Take 1 tablet (2.5 mg total) by mouth daily as needed (if weight 135 lbs or more). Take 40 meq potassium if you take this pill.   mometasone 220 MCG/INH inhaler Commonly known as:  ASMANEX Inhale 1 puff into the lungs daily.   montelukast 10 MG tablet Commonly known as:  SINGULAIR Take 1 tablet (10 mg total) by mouth at  bedtime. PLEASE SCHEDULE APPOINTMENT.   mupirocin ointment 2 % Commonly known as:  BACTROBAN Place 1 application into the nose 2 (two) times daily.   nitroGLYCERIN 0.4 MG SL tablet Commonly known as:  NITROSTAT Place 1 tablet (0.4 mg total) under the tongue every 5 (five) minutes as needed for chest pain.   omeprazole 40 MG capsule Commonly known as:  PRILOSEC Take 1 capsule (40 mg total) by mouth daily. What changed:  Another medication with the same name was removed. Continue taking this medication, and follow the directions you see here.   potassium chloride 10 MEQ tablet Commonly known as:  K-DUR Take 1 tablet (10 mEq total) by mouth daily.   RAPAFLO 8 MG Caps capsule Generic drug:  silodosin Take 8 mg by mouth every other day.   simvastatin 40 MG tablet Commonly known as:  ZOCOR TAKE ONE TABLET BY MOUTH AT BEDTIME   spironolactone 25 MG tablet Commonly known as:  ALDACTONE Take 1 tablet (25 mg total) by mouth daily.   SYSTANE OP Apply 2-3 drops to eye 3 (three) times daily as needed (dry eyes).     torsemide 20 MG tablet Commonly known as:  DEMADEX Take 1 tablet (20 mg total) by mouth 2 (two) times daily.   Vitamin D 2000 units Caps Take 2,000 Units by mouth daily at 12 noon.       Disposition: Home Discharge Instructions    Diet - low sodium heart healthy    Complete by:  As directed    Increase activity slowly    Complete by:  As directed      Follow-up Information    [facility] Follow up on 09/20/2016.   Specialty:  Cardiology Why:  2:00PM, wound check visit Contact information: [address] [place]       Deboraha Sprang, MD Follow up on 12/13/2016.   Specialty:  Cardiology Why:  3:15PM Contact information: [address] [place] [address] [phone]           Duration of Discharge Encounter: Greater than 30 minutes including physician time.  Venetia Night, PA-C 09/11/2016 8:24 AM

## 2016-09-11 NOTE — Op Note (Signed)
NAME:  [name], [name]                     ACCOUNT NO.:  MEDICAL RECORD NO.:  [id number]  LOCATION:                                 FACILITY:  PHYSICIAN:  Deboraha Sprang, MD, FACCDATE OF BIRTH:  December 06, 1940  DATE OF PROCEDURE:  09/10/2016 DATE OF DISCHARGE:                              OPERATIVE REPORT   PREOPERATIVE DIAGNOSES:  Sinus bradycardia with chronotropic incompetence, intraventricular conduction delay, and previously implanted defibrillator, now at elective replacement interval.  POSTOPERATIVE DIAGNOSES:  Sinus bradycardia with chronotropic incompetence; intraventricular conduction delay; previously implanted defibrillator, now at elective replacement interval; occlusion of the innominate vein.  PROCEDURES:  Contrast venogram, insertion of a left ventricular lead, insertion of an atrial lead, tunneling of leads from right to left, removal of a single chamber defibrillator, insertion of a CRT defibrillator, and high-voltage lead assessment.  DESCRIPTION OF PROCEDURE:  Following obtaining informed consent, the patient was brought to electrophysiology laboratory and placed on the fluoroscopic table in supine position.  Prior to prepping and draping, contrast venogram was obtained that demonstrated occlusion of the innominate vein.  We have also prepped both sides and then began on the right side with local anesthesia and incision carried down to layer of the prepectoral fascia.  Two venipunctures were accomplished. Guidewires were placed and retained, and sequentially, the 9.5 and 7- French sheaths were placed through which we passed a Medtronic MB2 CS cannulation sheath and a Medtronic 5076 MRI conditional leads, serial #[id number].  Under fluoroscopic guidance, we were able to cannulate the CS without much difficulty.  There was a mid lateral branch, which we were also able to cannulate a [employer] Jude __________ atrial lead, serial #[id number] was deployed to the junction  between the mid and distal third __________ pole configuration to Q-LV interval was 130 milliseconds. The amplitude was 11.2 with a pace impedance of 1159 with threshold of 0.6 V at 0.5 milliseconds.  There is no diaphragmatic pacing at 10 V. The deployment system was removed and this lead was secured.  We then deployed the right atrial lead in the remnant of the atrial appendage with the bipolar P-wave of 3.2 with a pace impedance of 744, a threshold of 0.7 V at 0.5 milliseconds.  Current threshold 1.0 mA.  There was no diaphragmatic pacing at 10 V, and the current of injury was brisk.  This lead was also secured to the prepectoral fascia and then both leads were further secured with a loop that had a diameter of about 2 cm.  At this juncture, we then tunneled towards the sternum from the right side.  We then turned our attention to the left side where lidocaine was again infiltrated along line of the previous incision carried down to layer of device pocket.  The pocket was opened, the device was freed up, and the pocket was extended caudally.  We then completed tunneling from right to left of the aforementioned leads and then, the leads were attached to a Medtronic Claria device, serial #[id number].  Through the device, bipolar P-wave was 2 with a pace impedance of 513, a threshold 1 V at 0.6 milliseconds.  The R-wave was 17.6  with a pace impedance of 703 with threshold of 1.25 V at 0.7 milliseconds, and the LV impedance was 870 with a threshold of 0.5 V at 0.4 milliseconds.  Proximal coil impedance was 77, distal coil impedance was 54.  The pocket was copiously irrigated with antibiotic containing saline solution.  Hemostasis was assured.  The leads in the pulse generator were placed in the pocket, secured to the prepectoral fascia.  Surgicel was placed at the cephalad and the lateral aspects of the pocket, and pocket was then closed in 3 layers in the normal fashion.  The wound was  washed, dried, and a Dermabond dressing was applied.  Following closure of the left hand incision, I then turned my attention back to the right side and this was closed also in a 3-layer closure following antibiotic irrigation.  A Dermabond dressing was applied.  Needle counts, sponge counts, instrument counts were correct at end of procedure according to staff.  The patient tolerated the procedure without apparent complication.     Deboraha Sprang, MD, [facility]     SCK/MEDQ  D:  09/10/2016  T:  09/10/2016  Job:  [id number]

## 2016-09-11 NOTE — Discharge Instructions (Signed)
° ° °  Supplemental Discharge Instructions for  Pacemaker/Defibrillator Patients  Activity No heavy lifting or vigorous activity with your right arm for 6 to 8 weeks.  Do not raise your right arm above your head for one week.  Gradually raise your affected arm as drawn below.             09/14/16                     09/15/16                     09/16/16                   8/20/8 __  NO DRIVING for  1 week  ; you may begin driving on   2/77/41.  WOUND CARE - Keep the wound area clean and dry.  Do not get this area wet, no showers for 24 hours you may shower on  09/11/16 evening . - The tape/steri-strips on your wound will fall off; do not pull them off.  No bandage is needed on the site.  DO  NOT apply any creams, oils, or ointments to the wound area. - If you notice any drainage or discharge from the wound, any swelling or bruising at the site, or you develop a fever > 101? F after you are discharged home, call the office at once.  Special Instructions - You are still able to use cellular telephones; use the ear opposite the side where you have your pacemaker/defibrillator.  Avoid carrying your cellular phone near your device. - When traveling through airports, show security personnel your identification card to avoid being screened in the metal detectors.  Ask the security personnel to use the hand wand. - Avoid arc welding equipment, MRI testing (magnetic resonance imaging), TENS units (transcutaneous nerve stimulators).  Call the office for questions about other devices. - Avoid electrical appliances that are in poor condition or are not properly grounded. - Microwave ovens are safe to be near or to operate.  Additional information for defibrillator patients should your device go off: - If your device goes off ONCE and you feel fine afterward, notify the device clinic nurses. - If your device goes off ONCE and you do not feel well afterward, call 911. - If your device goes off TWICE, call  911. - If your device goes off THREE times in one day, call 911.  DO NOT DRIVE YOURSELF OR A FAMILY MEMBER WITH A DEFIBRILLATOR TO THE HOSPITAL--CALL 911.

## 2016-09-12 ENCOUNTER — Encounter (HOSPITAL_COMMUNITY): Payer: Self-pay | Admitting: Nurse Practitioner

## 2016-09-12 ENCOUNTER — Ambulatory Visit (INDEPENDENT_AMBULATORY_CARE_PROVIDER_SITE_OTHER): Payer: Self-pay | Admitting: *Deleted

## 2016-09-12 ENCOUNTER — Telehealth: Payer: Self-pay | Admitting: Internal Medicine

## 2016-09-12 ENCOUNTER — Ambulatory Visit (HOSPITAL_COMMUNITY)
Admission: RE | Admit: 2016-09-12 | Discharge: 2016-09-12 | Disposition: A | Discharge status: Home / Self Care | Payer: Medicare Other | Source: Ambulatory Visit | Attending: Nurse Practitioner | Admitting: Nurse Practitioner

## 2016-09-12 VITALS — BP 108/60 | HR 103 | Ht 67.0 in | Wt 142.0 lb

## 2016-09-12 DIAGNOSIS — Z7982 Long term (current) use of aspirin: Secondary | ICD-10-CM | POA: Insufficient documentation

## 2016-09-12 DIAGNOSIS — Z95810 Presence of automatic (implantable) cardiac defibrillator: Secondary | ICD-10-CM

## 2016-09-12 DIAGNOSIS — I493 Ventricular premature depolarization: Secondary | ICD-10-CM | POA: Insufficient documentation

## 2016-09-12 DIAGNOSIS — I255 Ischemic cardiomyopathy: Secondary | ICD-10-CM

## 2016-09-12 DIAGNOSIS — I5022 Chronic systolic (congestive) heart failure: Secondary | ICD-10-CM | POA: Insufficient documentation

## 2016-09-12 DIAGNOSIS — I480 Paroxysmal atrial fibrillation: Secondary | ICD-10-CM | POA: Diagnosis not present

## 2016-09-12 DIAGNOSIS — K219 Gastro-esophageal reflux disease without esophagitis: Secondary | ICD-10-CM | POA: Insufficient documentation

## 2016-09-12 DIAGNOSIS — I2510 Atherosclerotic heart disease of native coronary artery without angina pectoris: Secondary | ICD-10-CM | POA: Insufficient documentation

## 2016-09-12 DIAGNOSIS — Z7901 Long term (current) use of anticoagulants: Secondary | ICD-10-CM | POA: Insufficient documentation

## 2016-09-12 DIAGNOSIS — I4891 Unspecified atrial fibrillation: Secondary | ICD-10-CM | POA: Diagnosis not present

## 2016-09-12 DIAGNOSIS — Z880 Allergy status to penicillin: Secondary | ICD-10-CM | POA: Diagnosis not present

## 2016-09-12 DIAGNOSIS — Z951 Presence of aortocoronary bypass graft: Secondary | ICD-10-CM | POA: Diagnosis not present

## 2016-09-12 DIAGNOSIS — I252 Old myocardial infarction: Secondary | ICD-10-CM | POA: Insufficient documentation

## 2016-09-12 DIAGNOSIS — Z79899 Other long term (current) drug therapy: Secondary | ICD-10-CM | POA: Insufficient documentation

## 2016-09-12 NOTE — Telephone Encounter (Signed)
Spoke with patient and he c/o that he went into a-fib this morning with BP 122/77; HR 134 initially & recheck BP 95/67; HR 124 per wrist bp monitor. Patient also c/o a little SOB. No c/o chest pain or dizziness. Patient was given an appointment to be seen today at 3:30 pm in the a-fib clinic. Patient advised that if his symptoms got worse that he needed to go to the ED for an evaluation. Patient verbalized understanding of plan.

## 2016-09-12 NOTE — Progress Notes (Signed)
Patient added-on in [facility] for hematoma check.  Dermabond in place.  R chest wound site without edema, ecchymosis noted but soft to palpation, incision edges approximated.  Large hematoma present across L chest, firm to palpation.  Incision edges approximated.  Dr. [name] saw patient, ordered pressure dressing application to L chest area.  Application successful.  Per Dr. [name], -atient instructed to STOP Eliquis until hematoma recheck on 8/17.  Patient also instructed to take bisoprolol 2.5mg  BID while in AF per Dr. [name].  Patient and wife agree to call if he develops any signs/symptoms of infection prior to next appointment.

## 2016-09-12 NOTE — Telephone Encounter (Signed)
New Message  Pt call requesting to speak with RN about getting an appt as soon as possible for afib. bs

## 2016-09-12 NOTE — Patient Instructions (Signed)
Medication Instructions:  - Take a half tablet (2.5mg ) bisoprolol in the morning and a half tablet (2.5mg ) of bisoprolol at night while you are in atrial fibrillation.  - STOP your Eliquis until the wound can be reassessed on Friday, 09/14/16  Labwork: N/A   Testing/Procedures: N/A  Follow-Up: Return on Friday, 09/14/16 at 12:00pm for wound recheck.  Any Other Special Instructions Will Be Listed Below (If Applicable).     If you need a refill on your cardiac medications before your next appointment, please call your pharmacy.

## 2016-09-12 NOTE — Progress Notes (Signed)
Primary Care Physician: [name], MD Referring Physician: [facility] Triage EP: Dr. Patrice Paradise HYATT CAPOBIANCO is a 76 y.o. male with a h/o afib that was in the hospital for a PPM upgrade to BIV pacer and discharged yesterday. He returned to afib this am. He did stop eliquis for the night before and morning of procedure dose but was given Eliquis right  after the procedure which he has continued with his regular dosing since d/c.Marland KitchenHe is v paced at 103 bpm. He has not had any afib x one year.Pacer pocket slightly bruised but the pocket is tense, representing a change from yesterday. No redness or drainage.   Today, he denies symptoms of palpitations, chest pain, shortness of breath, orthopnea, PND, lower extremity edema, dizziness, presyncope, syncope, or neurologic sequela. The patient is tolerating medications without difficulties and is otherwise without complaint today.   Past Medical History:  Diagnosis Date  . Acute on chronic systolic CHF (congestive heart failure) ([place]) 06/09/2015  . 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 Cardiomyopathy, ischemic 2011   with EF 25-35% by echo  . Coronary artery disease    Hx MI 1992, CABG 1998 , Nuc study 11.2013 large scar but no ischemia  . GERD (gastroesophageal reflux disease)   . H. pylori infection    Hx of   . 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 NSVT (nonsustained ventricular tachycardia) ([place])   . Paroxysmal atrial fibrillation [facility])    Past Surgical History:  Procedure Laterality Date  . BIV UPGRADE N/A 09/10/2016   Procedure: BiV ICD Upgrade;  Surgeon: Deboraha Sprang, MD;  Location: [facility];  Service: Cardiovascular;  Laterality: N/A;  . CATARACT EXTRACTION, BILATERAL    . CORONARY ARTERY BYPASS GRAFT  1998   x 5  . HERNIA REPAIR  1960's   right inguinal  . ICD GENERATOR CHANGE  01/2008   medtronic, hx+ EP study  . KNEE  SURGERY     right  . NASAL SINUS SURGERY  05/31/2010   Dr. Benjamine Mola    Current Outpatient Prescriptions  Medication Sig Dispense Refill  . acetaminophen (TYLENOL) 650 MG CR tablet Take 650 mg by mouth 2 (two) times daily.     Marland Kitchen allopurinol (ZYLOPRIM) 300 MG tablet Take 300 mg by mouth daily.    Marland Kitchen aspirin EC 81 MG tablet Take 81 mg by mouth every Monday, Wednesday, and Friday.    . Cholecalciferol (VITAMIN D) 2000 UNITS CAPS Take 2,000 Units by mouth daily at 12 noon.     . dicyclomine (BENTYL) 10 MG capsule Take 1 capsule (10 mg total) by mouth every 8 (eight) hours as needed for spasms. 90 capsule 1  . ELIQUIS 5 MG TABS tablet Take 1 tablet (5 mg total) by mouth 2 (two) times daily. 28 tablet 0  . isosorbide mononitrate (IMDUR) 30 MG 24 hr tablet Take 1 tablet (30 mg total) by mouth daily. PLEASE CONTACT OFFICE FOR ADDITIONAL REFILLS (Patient taking differently: Take 15 mg by mouth daily. PLEASE CONTACT OFFICE FOR ADDITIONAL REFILLS) 90 tablet 3  . LORazepam (ATIVAN) 1 MG tablet Take 0.25-0.5 mg by mouth at bedtime.     Marland Kitchen losartan (COZAAR) 25 MG tablet Take 1 tablet (25 mg total) by mouth at bedtime. 90 tablet 3  . magnesium oxide (MAG-OX) 400 MG tablet Take 400 mg by mouth daily at 12 noon.     Marland Kitchen  mometasone (ASMANEX) 220 MCG/INH inhaler Inhale 1 puff into the lungs daily.     . montelukast (SINGULAIR) 10 MG tablet Take 1 tablet (10 mg total) by mouth at bedtime. PLEASE SCHEDULE APPOINTMENT. 30 tablet 0  . Multiple Vitamins-Minerals (ICAPS AREDS 2 PO) Take 1 tablet by mouth 2 (two) times daily.    . mupirocin ointment (BACTROBAN) 2 % Place 1 application into the nose 2 (two) times daily. 22 g 0  . omeprazole (PRILOSEC) 40 MG capsule Take 1 capsule (40 mg total) by mouth daily. 90 capsule 3  . Polyethyl Glycol-Propyl Glycol (SYSTANE OP) Apply 2-3 drops to eye 3 (three) times daily as needed (dry eyes).    . potassium chloride (K-DUR) 10 MEQ tablet Take 1 tablet (10 mEq total) by mouth daily. 30  tablet 6  . silodosin (RAPAFLO) 8 MG CAPS capsule Take 8 mg by mouth every other day.    . simvastatin (ZOCOR) 40 MG tablet TAKE ONE TABLET BY MOUTH AT BEDTIME 90 tablet 2  . spironolactone (ALDACTONE) 25 MG tablet Take 1 tablet (25 mg total) by mouth daily. 90 tablet 3  . torsemide (DEMADEX) 20 MG tablet Take 1 tablet (20 mg total) by mouth 2 (two) times daily. 180 tablet 3  . bisoprolol (ZEBETA) 5 MG tablet Take 0.5 tablets (2.5 mg total) by mouth daily. (Patient not taking: Reported on 09/12/2016) 45 tablet 3  . cetirizine (ZYRTEC) 10 MG tablet Take 10 mg by mouth daily.      . metolazone (ZAROXOLYN) 2.5 MG tablet Take 1 tablet (2.5 mg total) by mouth daily as needed (if weight 135 lbs or more). Take 40 meq potassium if you take this pill. (Patient not taking: Reported on 09/05/2016) 2 tablet 0  . nitroGLYCERIN (NITROSTAT) 0.4 MG SL tablet Place 1 tablet (0.4 mg total) under the tongue every 5 (five) minutes as needed for chest pain. 25 tablet 1   No current facility-administered medications for this encounter.     Allergies  Allergen Reactions  . Amoxicillin Rash    Has patient had a PCN reaction causing immediate rash, facial/tongue/throat swelling, SOB or lightheadedness with hypotension: Yes Has patient had a PCN reaction causing severe rash involving mucus membranes or skin necrosis: No Has patient had a PCN reaction that required hospitalization: No Has patient had a PCN reaction occurring within the last 10 years: No If all of the above answers are "NO", then may proceed with Cephalosporin use.   Marland Kitchen Penicillins Rash    Has patient had a PCN reaction causing immediate rash, facial/tongue/throat swelling, SOB or lightheadedness with hypotension: Yes Has patient had a PCN reaction causing severe rash involving mucus membranes or skin necrosis: No Has patient had a PCN reaction that required hospitalization: No Has patient had a PCN reaction occurring within the last 10 years: No If all  of the above answers are "NO", then may proceed with Cephalosporin use.     Social History   Social History  . Marital status: Married    Spouse name: N/A  . Number of children: 3  . Years of education: N/A   Occupational History  . slef employed semi retired    Social History Main Topics  . Smoking status: Never Smoker  . Smokeless tobacco: Never Used  . Alcohol use 1.2 oz/week    2 Glasses of wine per week     Comment: socially  . Drug use: No  . Sexual activity: Not on file   Other Topics  Concern  . Not on file   Social History Narrative  . No narrative on file    Family History  Problem Relation Age of Onset  . Heart disease Father        questionable  . Stroke Mother   . Heart failure Sister   . Healthy Brother   . Healthy Sister   . Parkinsonism Brother   . Colon cancer Neg Hx     ROS- All systems are reviewed and negative except as per the HPI above  Physical Exam: Vitals:   09/12/16 1527  BP: 108/60  Pulse: (!) 103  Weight: 142 lb (64.4 kg)  Height: 5\' 7"  (1.702 m)   Wt Readings from Last 3 Encounters:  09/12/16 142 lb (64.4 kg)  09/11/16 134 lb 14.7 oz (61.2 kg)  07/31/16 145 lb (65.8 kg)    Labs: Lab Results  Component Value Date   NA 137 09/03/2016   K 3.3 (L) 09/10/2016   CL 98 09/03/2016   CO2 25 09/03/2016   GLUCOSE 103 (H) 09/03/2016   BUN 12 09/03/2016   CREATININE 0.98 09/03/2016   CALCIUM 8.9 09/03/2016   PHOS 2.4 04/28/2007   MG 2.1 06/14/2015   Lab Results  Component Value Date   INR 2.2 05/02/2016   Lab Results  Component Value Date   CHOL 156 06/12/2016   HDL 42 06/12/2016   LDLCALC 90 06/12/2016   TRIG 119 06/12/2016     GEN- The patient is well appearing, alert and oriented x 3 today.   Head- normocephalic, atraumatic Eyes-  Sclera clear, conjunctiva pink Ears- hearing intact Oropharynx- clear Neck- supple, no JVP Lymph- no cervical lymphadenopathy Lungs- Clear to ausculation bilaterally, normal work  of breathing Heart- irregular rate and rhythm, no murmurs, rubs or gallops, PMI not laterally displaced. PPM sites let/right chest area inspected and  tense pocket Left chest, small amount of bruising ant/laterally. Incision intact  without drainage, redness. GI- soft, NT, ND, + BS Extremities- no clubbing, cyanosis, or edema MS- no significant deformity or atrophy Skin- no rash or lesion Psych- euthymic mood, full affect Neuro- strength and sensation are intact  EKG-v paced at 103 bpm, qrs int 148 ms, qtc 544 ms Epic records reviewd    Assessment and Plan: 1.Afib Rate right at 100 bpm, BP is soft For now will continue with bisoprolol at 2.5 mg daily Per Dr. [name], if afib is persistent, plan on cardioversion, all cardioversion appointments are taken for the rest of the week  2. Upgrade from single chamber ICD to CRT device 09/10/16 Pocket is tense but does not show signs of infection I will send to [facility] street office and Dr. [name] will check the site today  IF afib persists, pt will be in contact and will get cardioversion early next week  Butch Penny C. Lexie Morini, [facility] [address] [place], [place] [address] [phone]

## 2016-09-14 ENCOUNTER — Encounter: Payer: Self-pay | Admitting: Internal Medicine

## 2016-09-14 ENCOUNTER — Ambulatory Visit (INDEPENDENT_AMBULATORY_CARE_PROVIDER_SITE_OTHER): Payer: Self-pay | Admitting: *Deleted

## 2016-09-14 DIAGNOSIS — I5022 Chronic systolic (congestive) heart failure: Secondary | ICD-10-CM

## 2016-09-14 DIAGNOSIS — I255 Ischemic cardiomyopathy: Secondary | ICD-10-CM

## 2016-09-14 NOTE — Progress Notes (Signed)
Patient presents to the office for a hematoma recheck. Pressure dressing removed--no drainage noted on gauze. Per wife and Levander Campion, RN-hematoma appears smaller than it did on 09/12/16. Hematoma remains firm to palpation. Purple and yellow ecchymosis noted around L chest and ribs. Information was relayed to [place] via telephone. Dr.Klein recommended that another pressure dressing be applied and left in place until Monday and that patient continue to hold his ASA and Eliquis as well. Dr.Klein asked if patient is in atrial fibrillation today. Patient reports that he is in NSR. Pressure dressing applied successfully. Patient was instructed to leave pressure dressing in place until his appt on Monday and to continue to hold his ASA and Eliquis. Patient verbalized understanding of instructions.   Dr.Klein requested a call on Monday with an update about patient's wound.  Pictures of patient's wound from 8/15 and today (8/17) were uploaded into patient's record using [facility]. Images were taken and stored on patient's personal cellular device. Patient's cellular device was used to upload the images.

## 2016-09-14 NOTE — Telephone Encounter (Signed)
Agree; thank you for letting me know

## 2016-09-17 ENCOUNTER — Encounter: Payer: Self-pay | Admitting: Internal Medicine

## 2016-09-17 ENCOUNTER — Ambulatory Visit (INDEPENDENT_AMBULATORY_CARE_PROVIDER_SITE_OTHER): Payer: Self-pay | Admitting: *Deleted

## 2016-09-17 DIAGNOSIS — I255 Ischemic cardiomyopathy: Secondary | ICD-10-CM

## 2016-09-17 NOTE — Progress Notes (Signed)
Patient presents to the office for a hematoma recheck. Pressure dressing removed--no drainage noted on gauze. Per wife and Memory Dance, RN-hematoma appears smaller than it did on 09/14/16. Hematoma soft to palpate. Yellow and purple ecchymosis noted around L chest and ribs. Patient states that he has been in NSR since Wednesday 09/12/16. Called Dr. [name] per request, recommended patient restart ASA and Eliquis Wednesday morning and return for scheduled wound check Thursday 09/20/16 to remove dermabond.   Dr. [name] requested a call on Thursday with an update about patient's wound.  Pictures of patient's wound from 09/17/16 were uploaded into record via [place]. Images were taken and stored on patient's personal cellular device. Patient's cellular device was used to upload images.

## 2016-09-18 DIAGNOSIS — H26491 Other secondary cataract, right eye: Secondary | ICD-10-CM | POA: Diagnosis not present

## 2016-09-19 DIAGNOSIS — Z95810 Presence of automatic (implantable) cardiac defibrillator: Secondary | ICD-10-CM | POA: Diagnosis not present

## 2016-09-19 DIAGNOSIS — M1120 Other chondrocalcinosis, unspecified site: Secondary | ICD-10-CM | POA: Diagnosis not present

## 2016-09-20 ENCOUNTER — Other Ambulatory Visit: Payer: Medicare Other | Admitting: *Deleted

## 2016-09-20 ENCOUNTER — Ambulatory Visit (INDEPENDENT_AMBULATORY_CARE_PROVIDER_SITE_OTHER): Payer: Medicare Other | Admitting: *Deleted

## 2016-09-20 DIAGNOSIS — R001 Bradycardia, unspecified: Secondary | ICD-10-CM

## 2016-09-20 DIAGNOSIS — Z79899 Other long term (current) drug therapy: Secondary | ICD-10-CM | POA: Diagnosis not present

## 2016-09-20 LAB — CUP PACEART INCLINIC DEVICE CHECK
Brady Statistic AP VS Percent: 1.08 %
Brady Statistic AS VS Percent: 2.59 %
Brady Statistic RV Percent Paced: 2.64 %
Date Time Interrogation Session: 20180823145645
HighPow Impedance: 50 Ohm
HighPow Impedance: 78 Ohm
Implantable Lead Implant Date: 20010806
Implantable Lead Implant Date: 20180813
Implantable Lead Location: 753859
Implantable Lead Serial Number: 104581
Implantable Pulse Generator Implant Date: 20180813
Lead Channel Impedance Value: 159.265
Lead Channel Impedance Value: 211.891
Lead Channel Impedance Value: 285 Ohm
Lead Channel Impedance Value: 361 Ohm
Lead Channel Impedance Value: 456 Ohm
Lead Channel Impedance Value: 475 Ohm
Lead Channel Impedance Value: 513 Ohm
Lead Channel Impedance Value: 513 Ohm
Lead Channel Impedance Value: 608 Ohm
Lead Channel Pacing Threshold Amplitude: 0.75 V
Lead Channel Pacing Threshold Amplitude: 1.125 V
Lead Channel Pacing Threshold Pulse Width: 0.4 ms
Lead Channel Pacing Threshold Pulse Width: 0.4 ms
Lead Channel Sensing Intrinsic Amplitude: 20.25 mV
Lead Channel Sensing Intrinsic Amplitude: 3.125 mV
Lead Channel Setting Pacing Pulse Width: 0.4 ms
Lead Channel Setting Sensing Sensitivity: 0.3 mV
MDC IDC LEAD IMPLANT DT: 20180813
MDC IDC LEAD LOCATION: 753858
MDC IDC LEAD LOCATION: 753860
MDC IDC MSMT BATTERY REMAINING LONGEVITY: 98 mo
MDC IDC MSMT BATTERY VOLTAGE: 3.1 V
MDC IDC MSMT LEADCHNL LV IMPEDANCE VALUE: 175.385
MDC IDC MSMT LEADCHNL LV IMPEDANCE VALUE: 183.214
MDC IDC MSMT LEADCHNL LV IMPEDANCE VALUE: 241.412
MDC IDC MSMT LEADCHNL LV IMPEDANCE VALUE: 646 Ohm
MDC IDC MSMT LEADCHNL LV IMPEDANCE VALUE: 703 Ohm
MDC IDC MSMT LEADCHNL LV IMPEDANCE VALUE: 760 Ohm
MDC IDC MSMT LEADCHNL LV IMPEDANCE VALUE: 779 Ohm
MDC IDC MSMT LEADCHNL RA PACING THRESHOLD AMPLITUDE: 0.5 V
MDC IDC MSMT LEADCHNL RA PACING THRESHOLD PULSEWIDTH: 0.4 ms
MDC IDC MSMT LEADCHNL RA SENSING INTR AMPL: 2.375 mV
MDC IDC MSMT LEADCHNL RV IMPEDANCE VALUE: 817 Ohm
MDC IDC MSMT LEADCHNL RV IMPEDANCE VALUE: 817 Ohm
MDC IDC MSMT LEADCHNL RV SENSING INTR AMPL: 20.625 mV
MDC IDC SET LEADCHNL LV PACING AMPLITUDE: 3.5 V
MDC IDC SET LEADCHNL RA PACING AMPLITUDE: 3.5 V
MDC IDC SET LEADCHNL RV PACING AMPLITUDE: 3.5 V
MDC IDC SET LEADCHNL RV PACING PULSEWIDTH: 0.4 ms
MDC IDC STAT BRADY AP VP PERCENT: 62.9 %
MDC IDC STAT BRADY AS VP PERCENT: 33.44 %
MDC IDC STAT BRADY RA PERCENT PACED: 61.42 %

## 2016-09-20 LAB — CUP PACEART REMOTE DEVICE CHECK
Battery Voltage: 2.62 V
HIGH POWER IMPEDANCE MEASURED VALUE: 50 Ohm
HighPow Impedance: 79 Ohm
Lead Channel Impedance Value: 968 Ohm
Lead Channel Setting Pacing Pulse Width: 0.7 ms
Lead Channel Setting Sensing Sensitivity: 0.3 mV
MDC IDC MSMT LEADCHNL RV SENSING INTR AMPL: 16.973 mV
MDC IDC PG IMPLANT DT: 20100111
MDC IDC SESS DTM: 20180806041707
MDC IDC SET LEADCHNL RV PACING AMPLITUDE: 2.5 V
MDC IDC STAT BRADY RV PERCENT PACED: 0.28 %

## 2016-09-20 NOTE — Progress Notes (Signed)
Wound re-check appointment for hematoma. Hematoma appears larger than it did on 09/17/16. Hematoma firm to palpate. More purple ecchymosis noted over device site than 09/17/16, yellow and purple noted around L chest and ribs. called Dr. [name] per request-- recommended to STOP ASA and Eliquis, not to remove dermabond, place pressure dressing, and schedule appointment with [facility] Monday, 27.   Dr. [name] requested a call on Monday with an update about patient's wound.   Thresholds, sensing, and impedances consistent with implant measurements. Device programmed at 3.5V for extra safety margin until 3 month visit. Histogram distribution appropriate for patient and level of activity. 3 mode switches (4.3%), Eliquis on hold for Hematoma-- EGMs appear AF, longest 7 hours 16 minutes, max A rate 500 bpm, Max V rate 167 bpm. No  ventricular arrhythmias noted. Patient educated about wound care, arm mobility, lifting restrictions, shock plan. ROV with [facility] 09/24/16 for wound re-check.

## 2016-09-21 DIAGNOSIS — Z79899 Other long term (current) drug therapy: Secondary | ICD-10-CM | POA: Diagnosis not present

## 2016-09-21 DIAGNOSIS — M79643 Pain in unspecified hand: Secondary | ICD-10-CM | POA: Diagnosis not present

## 2016-09-21 DIAGNOSIS — M1712 Unilateral primary osteoarthritis, left knee: Secondary | ICD-10-CM | POA: Diagnosis not present

## 2016-09-21 DIAGNOSIS — M1711 Unilateral primary osteoarthritis, right knee: Secondary | ICD-10-CM | POA: Diagnosis not present

## 2016-09-21 DIAGNOSIS — I509 Heart failure, unspecified: Secondary | ICD-10-CM | POA: Diagnosis not present

## 2016-09-21 DIAGNOSIS — M25561 Pain in right knee: Secondary | ICD-10-CM | POA: Diagnosis not present

## 2016-09-21 DIAGNOSIS — M19041 Primary osteoarthritis, right hand: Secondary | ICD-10-CM | POA: Diagnosis not present

## 2016-09-21 DIAGNOSIS — M109 Gout, unspecified: Secondary | ICD-10-CM | POA: Diagnosis not present

## 2016-09-21 LAB — BASIC METABOLIC PANEL
BUN / CREAT RATIO: 21 (ref 10–24)
BUN: 19 mg/dL (ref 8–27)
CHLORIDE: 95 mmol/L — AB (ref 96–106)
CO2: 26 mmol/L (ref 20–29)
Calcium: 9.5 mg/dL (ref 8.6–10.2)
Creatinine, Ser: 0.9 mg/dL (ref 0.76–1.27)
GFR calc non Af Amer: 83 mL/min/{1.73_m2} (ref >59)
GFR, EST AFRICAN AMERICAN: 96 mL/min/{1.73_m2} (ref >59)
GLUCOSE: 95 mg/dL (ref 65–99)
Potassium: 4.1 mmol/L (ref 3.5–5.2)
SODIUM: 138 mmol/L (ref 134–144)

## 2016-09-22 ENCOUNTER — Encounter (HOSPITAL_COMMUNITY): Payer: Self-pay

## 2016-09-22 ENCOUNTER — Telehealth: Payer: Self-pay | Admitting: Physician Assistant

## 2016-09-22 ENCOUNTER — Emergency Department (HOSPITAL_COMMUNITY)
Admission: EM | Admit: 2016-09-22 | Discharge: 2016-09-22 | Disposition: A | Discharge status: Home / Self Care | Payer: Medicare Other | Attending: Emergency Medicine | Admitting: Emergency Medicine

## 2016-09-22 DIAGNOSIS — Z79899 Other long term (current) drug therapy: Secondary | ICD-10-CM | POA: Diagnosis not present

## 2016-09-22 DIAGNOSIS — I252 Old myocardial infarction: Secondary | ICD-10-CM | POA: Diagnosis not present

## 2016-09-22 DIAGNOSIS — Z7982 Long term (current) use of aspirin: Secondary | ICD-10-CM | POA: Insufficient documentation

## 2016-09-22 DIAGNOSIS — T148XXA Other injury of unspecified body region, initial encounter: Secondary | ICD-10-CM

## 2016-09-22 DIAGNOSIS — J45909 Unspecified asthma, uncomplicated: Secondary | ICD-10-CM | POA: Diagnosis not present

## 2016-09-22 DIAGNOSIS — I5042 Chronic combined systolic (congestive) and diastolic (congestive) heart failure: Secondary | ICD-10-CM | POA: Diagnosis not present

## 2016-09-22 DIAGNOSIS — I480 Paroxysmal atrial fibrillation: Secondary | ICD-10-CM | POA: Diagnosis not present

## 2016-09-22 DIAGNOSIS — I2510 Atherosclerotic heart disease of native coronary artery without angina pectoris: Secondary | ICD-10-CM | POA: Diagnosis not present

## 2016-09-22 DIAGNOSIS — I428 Other cardiomyopathies: Secondary | ICD-10-CM | POA: Diagnosis not present

## 2016-09-22 DIAGNOSIS — Z7901 Long term (current) use of anticoagulants: Secondary | ICD-10-CM | POA: Insufficient documentation

## 2016-09-22 DIAGNOSIS — S2020XA Contusion of thorax, unspecified, initial encounter: Secondary | ICD-10-CM | POA: Diagnosis not present

## 2016-09-22 DIAGNOSIS — I97418 Intraoperative hemorrhage and hematoma of a circulatory system organ or structure complicating other circulatory system procedure: Secondary | ICD-10-CM | POA: Insufficient documentation

## 2016-09-22 LAB — CBC WITH DIFFERENTIAL/PLATELET
BASOS ABS: 0 10*3/uL (ref 0.0–0.1)
Basophils Relative: 0 %
EOS ABS: 0.3 10*3/uL (ref 0.0–0.7)
EOS PCT: 2 %
HCT: 38.1 % — ABNORMAL LOW (ref 39.0–52.0)
Hemoglobin: 12.6 g/dL — ABNORMAL LOW (ref 13.0–17.0)
LYMPHS PCT: 15 %
Lymphs Abs: 2.1 10*3/uL (ref 0.7–4.0)
MCH: 28.8 pg (ref 26.0–34.0)
MCHC: 33.1 g/dL (ref 30.0–36.0)
MCV: 87 fL (ref 78.0–100.0)
MONO ABS: 1.9 10*3/uL — AB (ref 0.1–1.0)
Monocytes Relative: 14 %
Neutro Abs: 9.9 10*3/uL — ABNORMAL HIGH (ref 1.7–7.7)
Neutrophils Relative %: 69 %
PLATELETS: 292 10*3/uL (ref 150–400)
RBC: 4.38 MIL/uL (ref 4.22–5.81)
RDW: 13.8 % (ref 11.5–15.5)
WBC: 14.2 10*3/uL — ABNORMAL HIGH (ref 4.0–10.5)

## 2016-09-22 LAB — BASIC METABOLIC PANEL
Anion gap: 6 (ref 5–15)
BUN: 13 mg/dL (ref 6–20)
CHLORIDE: 98 mmol/L — AB (ref 101–111)
CO2: 31 mmol/L (ref 22–32)
CREATININE: 0.84 mg/dL (ref 0.61–1.24)
Calcium: 8.9 mg/dL (ref 8.9–10.3)
GFR calc Af Amer: >60 mL/min (ref >60)
GFR calc non Af Amer: >60 mL/min (ref >60)
GLUCOSE: 88 mg/dL (ref 65–99)
Potassium: 3.8 mmol/L (ref 3.5–5.1)
SODIUM: 135 mmol/L (ref 135–145)

## 2016-09-22 NOTE — ED Triage Notes (Signed)
Pt had icd placed on 8/13. He had some swelling to the site and cardiologist told him to slop eliquis and asa and a compression dressing over site by cards. Over the last 3 days pt has noticed increased bruising and redness outside of the compression dressing and was instructed to come in by cards.

## 2016-09-22 NOTE — ED Provider Notes (Signed)
[facility] DEPT Provider Note   CSN: [id number] Arrival date & time: 09/22/16  1421     History   Chief Complaint No chief complaint on file.   HPI [name] is a 76 y.o. male.  Pt reports he had a defibrillator  Placed last week.  Pt reports he is on a blood thinner and has had a lot of swelling and bleeding under the skin.  Pt spoke to cardiology today due to increased swelling and was advised to come in for evaluation.  Pt has not noticed any redness.  He had a pressure dressing placed over area.  Pt denies any fever.  He has not had any chills.     The history is provided by the patient. No language interpreter was used.    Past Medical History:  Diagnosis Date  . Acute on chronic systolic CHF (congestive heart failure) ([place]) 06/09/2015  . 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 Cardiomyopathy, ischemic 2011   with EF 25-35% by echo  . Coronary artery disease    Hx MI 1992, CABG 1998 , Nuc study 11.2013 large scar but no ischemia  . GERD (gastroesophageal reflux disease)   . H. pylori infection    Hx of   . 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 NSVT (nonsustained ventricular tachycardia) ([place])   . Paroxysmal atrial fibrillation [facility])     Patient Active Problem List   Diagnosis Date Noted  . Congestive heart failure, NYHA class 3 ([place]) 09/10/2016  . LBBB (left bundle branch block) 11/24/2015  . Bloating 06/17/2015  . Pseudogout of knee   . NSVT (nonsustained ventricular tachycardia) ([place])   . Cholecystitis   . Hyperlipidemia LDL goal <70 07/08/2014  . Hemorrhoids, internal 05/24/2014  . Abdominal pain, chronic, epigastric 04/08/2014  . 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 Bradycardia 08/11/2012  . CABG '98. Low risk Myoview 11/13   . ICD (implantable cardioverter-defibrillator) in place   . SYSTOLIC HEART FAILURE, 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  . History of ventricular  tachycardia 05/04/2008  . GERD 03/02/2008  . Asthma 10/09/2007  . Irritable bowel syndrome 10/09/2007  . Ischemic cardiomyopathy 05/13/2007    Past Surgical History:  Procedure Laterality Date  . BIV UPGRADE N/A 09/10/2016   Procedure: BiV ICD Upgrade;  Surgeon: Deboraha Sprang, MD;  Location: [facility];  Service: Cardiovascular;  Laterality: N/A;  . CATARACT EXTRACTION, BILATERAL    . CORONARY ARTERY BYPASS GRAFT  1998   x 5  . HERNIA REPAIR  1960's   right inguinal  . ICD GENERATOR CHANGE  01/2008   medtronic, hx+ EP study  . KNEE SURGERY     right  . NASAL SINUS SURGERY  05/31/2010   Dr. Benjamine Mola       Home Medications    Prior to Admission medications   Medication Sig Start Date End Date Taking? Authorizing Provider  acetaminophen (TYLENOL) 650 MG CR tablet Take 650 mg by mouth 2 (two) times daily.     [provider]  allopurinol (ZYLOPRIM) 300 MG tablet Take 300 mg by mouth daily.    [provider]  aspirin EC 81 MG tablet Take 81 mg by mouth every Monday, Wednesday, and Friday.    [provider]  bisoprolol (ZEBETA) 5 MG tablet Take 0.5 tablets (2.5 mg total) by mouth daily. 01/19/16   Bensimhon, Shaune Pascal, MD  cetirizine (ZYRTEC) 10  MG tablet Take 10 mg by mouth daily.      [provider]  Cholecalciferol (VITAMIN D) 2000 UNITS CAPS Take 2,000 Units by mouth daily at 12 noon.     [provider]  dicyclomine (BENTYL) 10 MG capsule Take 1 capsule (10 mg total) by mouth every 8 (eight) hours as needed for spasms. 04/24/16   Armbruster, Renelda Loma, MD  ELIQUIS 5 MG TABS tablet Take 1 tablet (5 mg total) by mouth 2 (two) times daily. 08/08/16   Troy Sine, MD  isosorbide mononitrate (IMDUR) 30 MG 24 hr tablet Take 1 tablet (30 mg total) by mouth daily. PLEASE CONTACT OFFICE FOR ADDITIONAL REFILLS Patient taking differently: Take 15 mg by mouth daily. PLEASE CONTACT OFFICE FOR ADDITIONAL REFILLS 01/19/16   Bensimhon,  Shaune Pascal, MD  LORazepam (ATIVAN) 1 MG tablet Take 0.25-0.5 mg by mouth at bedtime.     [provider]  losartan (COZAAR) 25 MG tablet Take 1 tablet (25 mg total) by mouth at bedtime. 01/19/16   Bensimhon, Shaune Pascal, MD  magnesium oxide (MAG-OX) 400 MG tablet Take 400 mg by mouth daily at 12 noon.     [provider]  metolazone (ZAROXOLYN) 2.5 MG tablet Take 1 tablet (2.5 mg total) by mouth daily as needed (if weight 135 lbs or more). Take 40 meq potassium if you take this pill. 07/25/15   Bensimhon, Shaune Pascal, MD  mometasone [facility]) 220 MCG/INH inhaler Inhale 1 puff into the lungs daily.     [provider]  montelukast (SINGULAIR) 10 MG tablet Take 1 tablet (10 mg total) by mouth at bedtime. PLEASE SCHEDULE APPOINTMENT. 02/01/15   Troy Sine, MD  Multiple Vitamins-Minerals (ICAPS AREDS 2 PO) Take 1 tablet by mouth 2 (two) times daily.    [provider]  nitroGLYCERIN (NITROSTAT) 0.4 MG SL tablet Place 1 tablet (0.4 mg total) under the tongue every 5 (five) minutes as needed for chest pain. 07/25/15   Bensimhon, Shaune Pascal, MD  omeprazole (PRILOSEC) 20 MG capsule Take 20 mg by mouth every morning. 06/12/16   [provider]  Polyethyl Glycol-Propyl Glycol (SYSTANE OP) Apply 2-3 drops to eye 3 (three) times daily as needed (dry eyes).    [provider]  potassium chloride (K-DUR) 10 MEQ tablet Take 1 tablet (10 mEq total) by mouth daily. 09/11/16   Baldwin Jamaica, PA-C  silodosin (RAPAFLO) 8 MG CAPS capsule Take 8 mg by mouth every other day.    [provider]  simvastatin (ZOCOR) 40 MG tablet TAKE ONE TABLET BY MOUTH AT BEDTIME 08/29/16   Troy Sine, MD  spironolactone (ALDACTONE) 25 MG tablet Take 1 tablet (25 mg total) by mouth daily. 01/19/16   Bensimhon, Shaune Pascal, MD  torsemide (DEMADEX) 20 MG tablet Take 1 tablet (20 mg total) by mouth 2 (two) times daily. 01/19/16   Bensimhon, Shaune Pascal, MD    Family History Family History   Problem Relation Age of Onset  . Heart disease Father        questionable  . Stroke Mother   . Heart failure Sister   . Healthy Brother   . Healthy Sister   . Parkinsonism Brother   . Colon cancer Neg Hx     Social History Social History  Substance Use Topics  . Smoking status: Never Smoker  . Smokeless tobacco: Never Used  . Alcohol use 1.2 oz/week    2 Glasses of wine per week  Comment: socially     Allergies   Amoxicillin and Penicillins   Review of Systems Review of Systems  All other systems reviewed and are negative.    Physical Exam Updated Vital Signs BP (!) 109/58   Pulse 66   Temp 98 F (36.7 C) (Oral)   Resp 16   SpO2 99%   Physical Exam  Constitutional: He appears well-developed and well-nourished.  HENT:  Head: Normocephalic and atraumatic.  Eyes: Conjunctivae are normal.  Neck: Neck supple.  Cardiovascular: Normal rate and regular rhythm.   No murmur heard. Large area of bruising upper chest.  Large 15cm hematoma over defibrillator site left chest.  No erythema, no streaking,  No sign of infection  Pulmonary/Chest: Effort normal and breath sounds normal. No respiratory distress.  Abdominal: Soft. There is no tenderness.  Musculoskeletal: He exhibits no edema.  Neurological: He is alert.  Skin: Skin is warm and dry.  Psychiatric: He has a normal mood and affect.  Nursing note and vitals reviewed.    ED Treatments / Results  Labs (all labs ordered are listed, but only abnormal results are displayed) Labs Reviewed  CBC WITH DIFFERENTIAL/PLATELET  BASIC METABOLIC PANEL    EKG  EKG Interpretation None       Radiology No results found.  Procedures Procedures (including critical care time)  Medications Ordered in ED Medications - No data to display   Initial Impression / Assessment and Plan / ED Course  I have reviewed the triage vital signs and the nursing notes.  Pertinent labs & imaging results that were available  during my care of the patient were reviewed by me and considered in my medical decision making (see chart for details).     Rosaria Ferries with Cardiology will see.  CBC ordered to compare hemaglobin  Final Clinical Impressions(s) / ED Diagnoses   Final diagnoses:  Hematoma    New Prescriptions New Prescriptions   No medications on file   Hemoglobin stable,   Pressure dressing replaced.  Pt advised to follow up with his MD next week.  An After Visit Summary was printed and given to the patient.   Fransico Meadow, PA-C 09/22/16 1715    Malvin Johns, MD 09/22/16 2330

## 2016-09-22 NOTE — Telephone Encounter (Signed)
Pt called because there are some reddened areas on his neck, outside of the pressure dressing that are new.  He is concerned.   No fevers or chills, no drainage.  Advised pt that I have no good way to assess this over the phone. The best option is for him to go to the closest ER and be evaluated. If this is minor, he may not need admission and can be discharged. If there is a need for admission, he can be transferred to [facility].  Lenoard Aden 09/22/2016 1:37 PM Beeper [phone]

## 2016-09-22 NOTE — ED Notes (Signed)
Cardiology at the bedside.

## 2016-09-22 NOTE — Discharge Instructions (Signed)
Keep pressure dressing on.  See your Physician for recheck next week.

## 2016-09-22 NOTE — ED Notes (Signed)
Provider at the bedside.  

## 2016-09-24 ENCOUNTER — Ambulatory Visit (INDEPENDENT_AMBULATORY_CARE_PROVIDER_SITE_OTHER): Payer: Self-pay | Admitting: *Deleted

## 2016-09-24 DIAGNOSIS — Z95810 Presence of automatic (implantable) cardiac defibrillator: Secondary | ICD-10-CM

## 2016-09-24 NOTE — Progress Notes (Signed)
Patient seen today in clinic for wound re-check s/p hematoma. Pressure dressing removed.Based on daily pictures of wound taken by family on iPad hematoma has grown slightly seen 8/23 with bruising that has spread laterally across chest to right sided incision. Patient denies fever or chills and has continued to hold is eliquis and aspirin. JA assessed and recommends that no changes be made at this time and that we avoid a pressure dressing until it assessed on 8/31 by SK. Patient and wife instructed to call if any drainage is seen from incision or if swelling increased within a small time frame. Direct number to DC given. Appointment made for DC on 8/31 at 11am.

## 2016-09-25 ENCOUNTER — Telehealth: Payer: Self-pay | Admitting: Cardiology

## 2016-09-25 ENCOUNTER — Telehealth (HOSPITAL_COMMUNITY): Payer: Self-pay | Admitting: Vascular Surgery

## 2016-09-25 NOTE — Telephone Encounter (Signed)
Patient called with concerns about his device site after his gen change on 09-10-16. Call forwarded to Frederica.

## 2016-09-25 NOTE — Telephone Encounter (Signed)
[name] would like a pressure dressing put back on the ICD site d/t feeling pressure. He has a hematoma that we are assessing and treating. He has no drainage from the incison and can not tell that the hematoma has gotten any larger. I advised him that yesterday Dr. Rayann Heman wanted the pressure dressing to stay off until he came back Friday and if he hasn't noticed any changes witht he site then I am disinclined to change the recommendations made. He verbalizes understanding and will take it day-by-day.

## 2016-09-25 NOTE — Telephone Encounter (Signed)
I called patient back and he was asking about he device and the problems he had been having with his hematoma.  He was asking if he should leave compression on it or not.  I advised him to listen to what the device clinic is telling him as they are the ones managing it.  He understands and no further questions.

## 2016-09-25 NOTE — Telephone Encounter (Signed)
Pt called to speak with nurse Urgently he states he is having some problems , he did not want to leave VM on Triage.. Please advise

## 2016-09-27 DIAGNOSIS — H26491 Other secondary cataract, right eye: Secondary | ICD-10-CM | POA: Diagnosis not present

## 2016-09-28 ENCOUNTER — Ambulatory Visit (INDEPENDENT_AMBULATORY_CARE_PROVIDER_SITE_OTHER): Payer: Self-pay | Admitting: *Deleted

## 2016-09-28 DIAGNOSIS — Z95810 Presence of automatic (implantable) cardiac defibrillator: Secondary | ICD-10-CM

## 2016-09-28 NOTE — Progress Notes (Addendum)
Patient presented today for hematoma f/u. Upon assessment large hematoma present, no growth in size identified, area soft to palpation. Pressure dressing applied per SK with f/u appt scheduled for 9/4. Maintain hold on aspirin and eliquis per SK.

## 2016-10-02 ENCOUNTER — Ambulatory Visit (INDEPENDENT_AMBULATORY_CARE_PROVIDER_SITE_OTHER): Payer: Self-pay | Admitting: *Deleted

## 2016-10-02 DIAGNOSIS — Z95810 Presence of automatic (implantable) cardiac defibrillator: Secondary | ICD-10-CM

## 2016-10-02 NOTE — Progress Notes (Signed)
Patient seen for wound re-check s/p hematoma. Pressure dressing removed. Hematoma soft to palpation, surrounding bruising as well as size diminished. SK assessed with recommendations for an additional pressure dressing to be applied and patient to maintain hold on eliquis and aspirin. Pressure dressing applied. F/U appt scheduled for 9/12 at 4pm.

## 2016-10-03 ENCOUNTER — Ambulatory Visit (HOSPITAL_COMMUNITY)
Admission: RE | Admit: 2016-10-03 | Discharge: 2016-10-03 | Disposition: A | Discharge status: Home / Self Care | Payer: Medicare Other | Source: Ambulatory Visit | Attending: Internal Medicine | Admitting: Internal Medicine

## 2016-10-03 VITALS — BP 122/56 | HR 84 | Wt 141.5 lb

## 2016-10-03 DIAGNOSIS — Z880 Allergy status to penicillin: Secondary | ICD-10-CM | POA: Insufficient documentation

## 2016-10-03 DIAGNOSIS — I255 Ischemic cardiomyopathy: Secondary | ICD-10-CM | POA: Diagnosis not present

## 2016-10-03 DIAGNOSIS — Z79899 Other long term (current) drug therapy: Secondary | ICD-10-CM | POA: Diagnosis not present

## 2016-10-03 DIAGNOSIS — Z95810 Presence of automatic (implantable) cardiac defibrillator: Secondary | ICD-10-CM | POA: Insufficient documentation

## 2016-10-03 DIAGNOSIS — I480 Paroxysmal atrial fibrillation: Secondary | ICD-10-CM

## 2016-10-03 DIAGNOSIS — I252 Old myocardial infarction: Secondary | ICD-10-CM | POA: Diagnosis not present

## 2016-10-03 DIAGNOSIS — Z8249 Family history of ischemic heart disease and other diseases of the circulatory system: Secondary | ICD-10-CM | POA: Diagnosis not present

## 2016-10-03 DIAGNOSIS — I2510 Atherosclerotic heart disease of native coronary artery without angina pectoris: Secondary | ICD-10-CM

## 2016-10-03 DIAGNOSIS — I447 Left bundle-branch block, unspecified: Secondary | ICD-10-CM | POA: Diagnosis not present

## 2016-10-03 DIAGNOSIS — Z823 Family history of stroke: Secondary | ICD-10-CM | POA: Insufficient documentation

## 2016-10-03 DIAGNOSIS — I5022 Chronic systolic (congestive) heart failure: Secondary | ICD-10-CM | POA: Insufficient documentation

## 2016-10-03 NOTE — Progress Notes (Signed)
Patient ID: [name], male   DOB: October 29, 1940, 76 y.o.   MRN: [id number]    Advanced Heart Failure Clinic Note   Date:  10/03/2016   ID:  [name], [name] Aug 12, 1940, MRN [id number]  PCP:  [name], MD  Primary Cardiologist:  Tresa Endo Referring: Dr. [name] Kill   History of Present Illness: [name] is a 76 y.o. 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 In September 1998 he underwent CABG revascularization surgery after an unsuccessful attempt at stenting of his proximal LAD by Dr. Juanda Chance. Ejection fraction was 25-30%. In 2002 he underwent initial ICD implantation for nonsustained ventricular tachycardia documented on event monitor for primary prevention. In January 2010 he underwent generator change with a Medtronic Virtuoso single chamber cardioverter defibrillator.  A nuclear perfusion study in November 2013 showed a large area of scar in the LAD territory (extent 44%) involving the mid to apical anterior,apical and infero-apical to mid infero-septal and apical lateral wall without associated ischemia.  He is on Coumadin anticoagulation for PAF. He was readmitted to the hospital from a May 2 through 06/04/2014 with recurrent atrial fibrillation and started on Tikosyn. He was enrolled in the genetic AF trial (bucindolol vs Toprol). He did poorly in the trial in the setting of titration of beta-blocker. He was referred to HF Clinic. Genetic AF study completed for pt. He is now on bisoprol 2.5 mg daily.   He has been discussing CRT upgrade with Dr. Graciela Husbands and has intermediate predictors of benefit (LBBB with QRS almost 150 ms, but ischemic cardiomyopathy and male gender).    He presents today for routine follow-up.  Had device upgrade 09/11/15. Post-procedure complicated by large chest wall hematoma which is now well healing. Still off Eliquis. HF has been doing well from HF  perspective. Feeld better aft CRT. No edema, orthopnea or PND. Weight stable. Saw Dr. Graciela Husbands yesterday who was pleased with progress.   CPX 02/17/16 Resting HR: 73 Peak HR: 108  (75% age predicted max HR) BP rest: 110/58 BP peak: 150/56 Peak VO2: 14.4 (58% predicted peak VO2) VE/VCO2 slope: 33 OUES: 1.32 Peak RER: 1.10 PETCO2 at peak: 33 O2pulse: 9  (82% predicted O2pulse)  Echo 12/17 EF 15-20%, Moderate MR, RV with reduced function Echo 5/17 EF 20% RV mildly down.   Optivol interrogation today shows fluid index well below threshold. Thoracic impedence stable. Pt activity 2 hrs a day. No AT/VT/VF.  Labs 10/04/15 K 5.3, Creatinine 0.85, BUN 15  Past Medical History:  Diagnosis Date  . Acute on chronic systolic CHF (congestive heart failure) ([facility]) 06/09/2015  . 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 Cardiomyopathy, ischemic 2011   with EF 25-35% by echo  . Coronary artery disease    Hx MI 1992, CABG 1998 , Nuc study 11.2013 large scar but no ischemia  . GERD (gastroesophageal reflux disease)   . H. pylori infection    Hx of   . 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 NSVT (nonsustained ventricular tachycardia) ([facility])   . Paroxysmal atrial fibrillation [facility])     Current Outpatient Prescriptions  Medication Sig Dispense Refill  . allopurinol (ZYLOPRIM) 300 MG tablet Take 300 mg by mouth daily.    . bisoprolol (ZEBETA) 5 MG tablet Take 0.5 tablets (2.5 mg total) by mouth daily.  45 tablet 3  . cetirizine (ZYRTEC) 10 MG tablet Take 10 mg by mouth daily.      . Cholecalciferol (VITAMIN D) 2000 UNITS CAPS Take 2,000 Units by mouth daily at 12 noon.     . colchicine 0.6 MG tablet Take 0.6 mg by mouth daily as needed.    . dicyclomine (BENTYL) 10 MG capsule Take 1 capsule (10 mg total) by mouth every 8 (eight) hours as needed for spasms. 90 capsule 1  . isosorbide mononitrate (IMDUR) 30 MG 24 hr  tablet Take 15 mg by mouth daily.    Marland Kitchen LORazepam (ATIVAN) 1 MG tablet Take 0.25-0.5 mg by mouth at bedtime.     Marland Kitchen losartan (COZAAR) 25 MG tablet Take 1 tablet (25 mg total) by mouth at bedtime. 90 tablet 3  . magnesium oxide (MAG-OX) 400 MG tablet Take 400 mg by mouth daily at 12 noon.     . metolazone (ZAROXOLYN) 2.5 MG tablet Take 1 tablet (2.5 mg total) by mouth daily as needed (if weight 135 lbs or more). Take 40 meq potassium if you take this pill. 2 tablet 0  . mometasone (ASMANEX) 220 MCG/INH inhaler Inhale 1 puff into the lungs daily.     . montelukast (SINGULAIR) 10 MG tablet Take 1 tablet (10 mg total) by mouth at bedtime. PLEASE SCHEDULE APPOINTMENT. 30 tablet 0  . nitroGLYCERIN (NITROSTAT) 0.4 MG SL tablet Place 1 tablet (0.4 mg total) under the tongue every 5 (five) minutes as needed for chest pain. 25 tablet 1  . omeprazole (PRILOSEC) 20 MG capsule Take 20 mg by mouth every morning.    Bertram Gala Glycol-Propyl Glycol (SYSTANE OP) Apply 2-3 drops to eye 3 (three) times daily as needed (dry eyes).    . potassium chloride (K-DUR) 10 MEQ tablet Take 1 tablet (10 mEq total) by mouth daily. 30 tablet 6  . predniSONE (DELTASONE) 10 MG tablet Take 10 mg by mouth daily.    . silodosin (RAPAFLO) 8 MG CAPS capsule Take 8 mg by mouth every other day.    . simvastatin (ZOCOR) 40 MG tablet TAKE ONE TABLET BY MOUTH AT BEDTIME 90 tablet 2  . spironolactone (ALDACTONE) 25 MG tablet Take 1 tablet (25 mg total) by mouth daily. 90 tablet 3  . torsemide (DEMADEX) 20 MG tablet Take 1 tablet (20 mg total) by mouth 2 (two) times daily. 180 tablet 3  . acetaminophen (TYLENOL) 650 MG CR tablet Take 650 mg by mouth 2 (two) times daily.     Marland Kitchen ELIQUIS 5 MG TABS tablet Take 1 tablet (5 mg total) by mouth 2 (two) times daily. (Patient not taking: Reported on 09/22/2016) 28 tablet 0   No current facility-administered medications for this encounter.     Allergies:    Allergies  Allergen Reactions  .  Amoxicillin Rash    Has patient had a PCN reaction causing immediate rash, facial/tongue/throat swelling, SOB or lightheadedness with hypotension: Yes Has patient had a PCN reaction causing severe rash involving mucus membranes or skin necrosis: No Has patient had a PCN reaction that required hospitalization: No Has patient had a PCN reaction occurring within the last 10 years: No If all of the above answers are "NO", then may proceed with Cephalosporin use.   Marland Kitchen Penicillins Rash    Has patient had a PCN reaction causing immediate rash, facial/tongue/throat swelling, SOB or lightheadedness with hypotension: Yes Has patient had a PCN reaction causing severe rash involving mucus membranes or skin necrosis: No  Has patient had a PCN reaction that required hospitalization: No Has patient had a PCN reaction occurring within the last 10 years: No If all of the above answers are "NO", then may proceed with Cephalosporin use.     Social History:  The patient  reports that he has never smoked. He has never used smokeless tobacco. He reports that he drinks about 1.2 oz of alcohol per week . He reports that he does not use drugs.   Family history:   Family History  Problem Relation Age of Onset  . Heart disease Father        questionable  . Stroke Mother   . Heart failure Sister   . Healthy Brother   . Healthy Sister   . Parkinsonism Brother   . Colon cancer Neg Hx    ROS:  Please see history of present illness. All other systems reviewed and negative.    PHYSICAL EXAM: VS:  BP (!) 122/56   Pulse 84   Wt 141 lb 8 oz (64.2 kg)   SpO2 100%   BMI 22.16 kg/m    Wt Readings from Last 3 Encounters:  10/03/16 141 lb 8 oz (64.2 kg)  09/12/16 142 lb (64.4 kg)  09/11/16 134 lb 14.7 oz (61.2 kg)   General:  Well appearing. No resp difficulty HEENT: normal Neck: supple. no JVD. Carotids 2+ bilat; no bruits. No lymphadenopathy or thryomegaly appreciated. Cor: PMI laterally displaced. Regular  rate & rhythm. No rubs, gallops or murmurs. Resolving large hematoma over ICD pocket Lungs: clear Abdomen: soft, nontender, nondistended. No hepatosplenomegaly. No bruits or masses. Good bowel sounds. Extremities: no cyanosis, clubbing, rash, edema Neuro: alert & orientedx3, cranial nerves grossly intact. moves all 4 extremities w/o difficulty. Affect pleasant   ASSESSMENT AND PLAN:  1. Chronic systolic HF due to iCM, Echo 12/2015 EF 20 - CPX from 1/18  shows moderate HF limitation - Volume status looks good - Seems improved with CRT. NYHA II-III - Continue current regimen. BP too soft to titrate. 2. PAF - Had recurrent AF in setting of the flu 2 months ago. Now back in NSR - Eliquis on hold with large ICD hematoma. Hopefully can resume soon, Being managed by Dr. Graciela Husbands..  3. CAD s/p previous anterior infarct -stable no ischemia. Off ASA due to Eliquis. Continue statin and b-blocker 4. LBBB - now s/p CRT 5. ICD hematoma - resolving  Pauline Trainer,MD 2:20 PM

## 2016-10-03 NOTE — Patient Instructions (Signed)
Follow up in 3 Months 

## 2016-10-09 DIAGNOSIS — H353211 Exudative age-related macular degeneration, right eye, with active choroidal neovascularization: Secondary | ICD-10-CM | POA: Diagnosis not present

## 2016-10-10 ENCOUNTER — Ambulatory Visit (INDEPENDENT_AMBULATORY_CARE_PROVIDER_SITE_OTHER): Payer: Self-pay | Admitting: *Deleted

## 2016-10-10 DIAGNOSIS — I4729 Other ventricular tachycardia: Secondary | ICD-10-CM

## 2016-10-10 DIAGNOSIS — I255 Ischemic cardiomyopathy: Secondary | ICD-10-CM

## 2016-10-10 DIAGNOSIS — Z95810 Presence of automatic (implantable) cardiac defibrillator: Secondary | ICD-10-CM

## 2016-10-10 DIAGNOSIS — I472 Ventricular tachycardia: Secondary | ICD-10-CM

## 2016-10-10 NOTE — Progress Notes (Signed)
Wound recheck in clinic.  R chest incision healing well, no swelling or drainage noted.  Pressure dressing removed from L chest, incision healing well.  Hematoma remains over L chest.  Hematoma soft to palpation, decreased in size per Dr. [name].  Per Dr. [name], pressure dressing to remain off at this time and continue to hold ASA and Eliquis.  Patient agrees to call the [facility] if he develops any signs/symptoms of infection or bleeding in the interim.  Patient and wife verbalize understanding of all instructions.  Plan for wound recheck on 10/16/16 at 2:00pm while Dr. [name] is in the office.

## 2016-10-12 ENCOUNTER — Telehealth: Payer: Self-pay

## 2016-10-12 NOTE — Telephone Encounter (Signed)
Spoke with patient who reports that he thinks the puffiness around his device has gotten a little bigger. He stated that the area was soft and free of drainage. He denied a fever or chills. Patient was re-educated on signs and symptoms of bleeding including a rapid growth and/or hardening of hematoma. Patient was encouraged to keep follow up appointment for Tuesday. Patient verbalized understanding.

## 2016-10-16 ENCOUNTER — Ambulatory Visit (INDEPENDENT_AMBULATORY_CARE_PROVIDER_SITE_OTHER): Payer: Self-pay | Admitting: *Deleted

## 2016-10-16 DIAGNOSIS — I480 Paroxysmal atrial fibrillation: Secondary | ICD-10-CM

## 2016-10-16 MED ORDER — WARFARIN SODIUM 5 MG PO TABS: ORAL_TABLET | ORAL | 1 refills | Status: DC

## 2016-10-16 NOTE — Progress Notes (Signed)
Wound re-check, soft hematoma, lateral edges of device palpable. Healing stages of bruising, site assessed by SK, per SK site improved and pt to restart Coumadin at previous dose of alternating 2.5mg  and 5mg  and pt to have INR checked on Monday. Pt sent to scheduling for appointment to have INR checked.

## 2016-10-18 ENCOUNTER — Ambulatory Visit (INDEPENDENT_AMBULATORY_CARE_PROVIDER_SITE_OTHER): Payer: Medicare Other

## 2016-10-18 DIAGNOSIS — Z961 Presence of intraocular lens: Secondary | ICD-10-CM | POA: Diagnosis not present

## 2016-10-18 DIAGNOSIS — Z95810 Presence of automatic (implantable) cardiac defibrillator: Secondary | ICD-10-CM | POA: Diagnosis not present

## 2016-10-18 DIAGNOSIS — I5022 Chronic systolic (congestive) heart failure: Secondary | ICD-10-CM | POA: Diagnosis not present

## 2016-10-18 DIAGNOSIS — H353211 Exudative age-related macular degeneration, right eye, with active choroidal neovascularization: Secondary | ICD-10-CM | POA: Diagnosis not present

## 2016-10-18 DIAGNOSIS — H353122 Nonexudative age-related macular degeneration, left eye, intermediate dry stage: Secondary | ICD-10-CM | POA: Diagnosis not present

## 2016-10-18 DIAGNOSIS — H43811 Vitreous degeneration, right eye: Secondary | ICD-10-CM | POA: Diagnosis not present

## 2016-10-18 DIAGNOSIS — H469 Unspecified optic neuritis: Secondary | ICD-10-CM | POA: Diagnosis not present

## 2016-10-19 NOTE — Progress Notes (Signed)
EPIC Encounter for ICM Monitoring  Patient Name: [name] is a 76 y.o. male Date: 10/19/2016 Primary Care Physican: [name], MD Primary Cardiologist:Kelly/Bensimhon Electrophysiologist: [name] Dry Weight:137 lbs  BiV Pacing: 96.3%     Heart Failure questions reviewed, pt asymptomatic.  Battery replacement on 09/10/2016-BiV upgrade   Thoracic impedance normal (still developing baseline).  Prescribed dosage: Torsemide 20 mg 1 tablet twice a day  Recommendations: No changes.   Encouraged to call for fluid symptoms.  Follow-up plan: ICM clinic phone appointment on 11/19/2016. Office appointment scheduled 12/13/2016 with Dr. [name] and 01/02/2017 with Dr [name].  Copy of ICM check sent to Dr. [name].   3 month ICM trend: 10/18/2016   1 Year ICM trend:      [name], RN 10/19/2016 10:37 AM

## 2016-10-22 ENCOUNTER — Ambulatory Visit (INDEPENDENT_AMBULATORY_CARE_PROVIDER_SITE_OTHER): Payer: Medicare Other | Admitting: *Deleted

## 2016-10-22 ENCOUNTER — Encounter: Payer: Medicare Other | Admitting: *Deleted

## 2016-10-22 DIAGNOSIS — I480 Paroxysmal atrial fibrillation: Secondary | ICD-10-CM | POA: Diagnosis not present

## 2016-10-22 DIAGNOSIS — Z5181 Encounter for therapeutic drug level monitoring: Secondary | ICD-10-CM | POA: Insufficient documentation

## 2016-10-22 DIAGNOSIS — I4891 Unspecified atrial fibrillation: Secondary | ICD-10-CM

## 2016-10-22 DIAGNOSIS — Z7901 Long term (current) use of anticoagulants: Secondary | ICD-10-CM | POA: Diagnosis not present

## 2016-10-22 DIAGNOSIS — Z95810 Presence of automatic (implantable) cardiac defibrillator: Secondary | ICD-10-CM

## 2016-10-22 DIAGNOSIS — I255 Ischemic cardiomyopathy: Secondary | ICD-10-CM

## 2016-10-22 LAB — POCT INR
INR: 1.8
INR: 1.8

## 2016-10-22 NOTE — Progress Notes (Signed)
Pt seen d/t known hematoma at device site, soft hematoma of noted with healing stages of bruising, no discernable difference in size of hematoma.

## 2016-10-22 NOTE — Patient Instructions (Addendum)
A full discussion of the nature of anticoagulants has been carried out.  A benefit risk analysis has been presented to the patient, so that they understand the justification for choosing anticoagulation at this time. The need for frequent and regular monitoring, precise dosage adjustment and compliance is stressed.  Side effects of potential bleeding are discussed.  The patient should avoid any OTC items containing aspirin or ibuprofen, and should avoid great swings in general diet.  Avoid alcohol consumption.  Call if any signs of abnormal bleeding. If you call the Clinic & we don't answer please leave a message with your name & date of birth & telephone number.

## 2016-10-29 ENCOUNTER — Ambulatory Visit (INDEPENDENT_AMBULATORY_CARE_PROVIDER_SITE_OTHER): Payer: Medicare Other | Admitting: *Deleted

## 2016-10-29 DIAGNOSIS — Z95810 Presence of automatic (implantable) cardiac defibrillator: Secondary | ICD-10-CM | POA: Diagnosis not present

## 2016-10-29 DIAGNOSIS — Z8679 Personal history of other diseases of the circulatory system: Secondary | ICD-10-CM

## 2016-10-29 DIAGNOSIS — I480 Paroxysmal atrial fibrillation: Secondary | ICD-10-CM | POA: Diagnosis not present

## 2016-10-29 DIAGNOSIS — Z5181 Encounter for therapeutic drug level monitoring: Secondary | ICD-10-CM | POA: Diagnosis not present

## 2016-10-29 DIAGNOSIS — I4891 Unspecified atrial fibrillation: Secondary | ICD-10-CM | POA: Diagnosis not present

## 2016-10-29 DIAGNOSIS — Z7901 Long term (current) use of anticoagulants: Secondary | ICD-10-CM | POA: Diagnosis not present

## 2016-10-29 LAB — POCT INR: INR: 3.3

## 2016-11-01 DIAGNOSIS — Z23 Encounter for immunization: Secondary | ICD-10-CM | POA: Diagnosis not present

## 2016-11-05 ENCOUNTER — Ambulatory Visit (INDEPENDENT_AMBULATORY_CARE_PROVIDER_SITE_OTHER): Payer: Medicare Other | Admitting: Pharmacist Clinician (PhC)/ Clinical Pharmacy Specialist

## 2016-11-05 DIAGNOSIS — Z7901 Long term (current) use of anticoagulants: Secondary | ICD-10-CM | POA: Diagnosis not present

## 2016-11-05 DIAGNOSIS — I4891 Unspecified atrial fibrillation: Secondary | ICD-10-CM | POA: Diagnosis not present

## 2016-11-05 DIAGNOSIS — I480 Paroxysmal atrial fibrillation: Secondary | ICD-10-CM | POA: Diagnosis not present

## 2016-11-05 DIAGNOSIS — Z5181 Encounter for therapeutic drug level monitoring: Secondary | ICD-10-CM

## 2016-11-05 LAB — POCT INR: INR: 2

## 2016-11-13 DIAGNOSIS — K130 Diseases of lips: Secondary | ICD-10-CM | POA: Diagnosis not present

## 2016-11-15 DIAGNOSIS — H35362 Drusen (degenerative) of macula, left eye: Secondary | ICD-10-CM | POA: Diagnosis not present

## 2016-11-15 DIAGNOSIS — H353211 Exudative age-related macular degeneration, right eye, with active choroidal neovascularization: Secondary | ICD-10-CM | POA: Diagnosis not present

## 2016-11-19 ENCOUNTER — Ambulatory Visit (INDEPENDENT_AMBULATORY_CARE_PROVIDER_SITE_OTHER): Payer: Medicare Other

## 2016-11-19 DIAGNOSIS — I5022 Chronic systolic (congestive) heart failure: Secondary | ICD-10-CM | POA: Diagnosis not present

## 2016-11-19 DIAGNOSIS — J3089 Other allergic rhinitis: Secondary | ICD-10-CM | POA: Diagnosis not present

## 2016-11-19 DIAGNOSIS — J4540 Moderate persistent asthma, uncomplicated: Secondary | ICD-10-CM | POA: Diagnosis not present

## 2016-11-19 DIAGNOSIS — Z95810 Presence of automatic (implantable) cardiac defibrillator: Secondary | ICD-10-CM | POA: Diagnosis not present

## 2016-11-19 DIAGNOSIS — J3081 Allergic rhinitis due to animal (cat) (dog) hair and dander: Secondary | ICD-10-CM | POA: Diagnosis not present

## 2016-11-19 DIAGNOSIS — J301 Allergic rhinitis due to pollen: Secondary | ICD-10-CM | POA: Diagnosis not present

## 2016-11-19 NOTE — Progress Notes (Signed)
EPIC Encounter for ICM Monitoring  Patient Name: [name] is a 76 y.o. male Date: 11/19/2016 Primary Care Physican: [name], MD Primary Cardiologist:Kelly/Bensimhon Electrophysiologist: [name] Dry Weight: 137 lbs   BiV Pacing: 96.7%         Heart Failure questions reviewed, pt asymptomatic.   Thoracic impedance normal.  Prescribed dosage: Torsemide 20 mg 1 tablet twice a day  Recommendations: No changes.   Encouraged to call for fluid symptoms.  Follow-up plan: ICM clinic phone appointment on 01/14/2017.  Office appointment scheduled 12/13/2016 with Dr. [name].  Copy of ICM check sent to Dr. [name].   3 month ICM trend: 11/19/2016   1 Year ICM trend:      [name], RN 11/19/2016 1:21 PM

## 2016-11-21 ENCOUNTER — Ambulatory Visit (INDEPENDENT_AMBULATORY_CARE_PROVIDER_SITE_OTHER): Payer: Medicare Other | Admitting: Pharmacist Clinician (PhC)/ Clinical Pharmacy Specialist

## 2016-11-21 DIAGNOSIS — M109 Gout, unspecified: Secondary | ICD-10-CM | POA: Diagnosis not present

## 2016-11-21 DIAGNOSIS — I480 Paroxysmal atrial fibrillation: Secondary | ICD-10-CM | POA: Diagnosis not present

## 2016-11-21 DIAGNOSIS — I4891 Unspecified atrial fibrillation: Secondary | ICD-10-CM

## 2016-11-21 DIAGNOSIS — Z7901 Long term (current) use of anticoagulants: Secondary | ICD-10-CM | POA: Diagnosis not present

## 2016-11-21 DIAGNOSIS — M25561 Pain in right knee: Secondary | ICD-10-CM | POA: Diagnosis not present

## 2016-11-21 DIAGNOSIS — Z5181 Encounter for therapeutic drug level monitoring: Secondary | ICD-10-CM | POA: Diagnosis not present

## 2016-11-21 DIAGNOSIS — M1990 Unspecified osteoarthritis, unspecified site: Secondary | ICD-10-CM | POA: Diagnosis not present

## 2016-11-21 DIAGNOSIS — I509 Heart failure, unspecified: Secondary | ICD-10-CM | POA: Diagnosis not present

## 2016-11-21 DIAGNOSIS — Z79899 Other long term (current) drug therapy: Secondary | ICD-10-CM | POA: Diagnosis not present

## 2016-11-21 LAB — POCT INR: INR: 2

## 2016-11-27 ENCOUNTER — Ambulatory Visit: Payer: Medicare Other | Admitting: Neurology

## 2016-11-30 ENCOUNTER — Encounter: Payer: Self-pay | Admitting: Internal Medicine

## 2016-12-01 ENCOUNTER — Other Ambulatory Visit (HOSPITAL_COMMUNITY): Payer: Self-pay | Admitting: Internal Medicine

## 2016-12-04 ENCOUNTER — Other Ambulatory Visit (HOSPITAL_COMMUNITY): Payer: Self-pay | Admitting: *Deleted

## 2016-12-04 MED ORDER — BISOPROLOL FUMARATE 5 MG PO TABS: 2.5000 mg | ORAL_TABLET | Freq: Every day | ORAL | 3 refills | Status: DC

## 2016-12-06 DIAGNOSIS — J3081 Allergic rhinitis due to animal (cat) (dog) hair and dander: Secondary | ICD-10-CM | POA: Diagnosis not present

## 2016-12-06 DIAGNOSIS — J3089 Other allergic rhinitis: Secondary | ICD-10-CM | POA: Diagnosis not present

## 2016-12-06 DIAGNOSIS — J301 Allergic rhinitis due to pollen: Secondary | ICD-10-CM | POA: Diagnosis not present

## 2016-12-06 DIAGNOSIS — J4540 Moderate persistent asthma, uncomplicated: Secondary | ICD-10-CM | POA: Diagnosis not present

## 2016-12-07 DIAGNOSIS — M47817 Spondylosis without myelopathy or radiculopathy, lumbosacral region: Secondary | ICD-10-CM | POA: Diagnosis not present

## 2016-12-07 DIAGNOSIS — S86112D Strain of other muscle(s) and tendon(s) of posterior muscle group at lower leg level, left leg, subsequent encounter: Secondary | ICD-10-CM | POA: Diagnosis not present

## 2016-12-07 DIAGNOSIS — M545 Low back pain: Secondary | ICD-10-CM | POA: Diagnosis not present

## 2016-12-07 DIAGNOSIS — M25562 Pain in left knee: Secondary | ICD-10-CM | POA: Diagnosis not present

## 2016-12-11 DIAGNOSIS — M25562 Pain in left knee: Secondary | ICD-10-CM | POA: Diagnosis not present

## 2016-12-11 DIAGNOSIS — M545 Low back pain: Secondary | ICD-10-CM | POA: Diagnosis not present

## 2016-12-11 DIAGNOSIS — S86112D Strain of other muscle(s) and tendon(s) of posterior muscle group at lower leg level, left leg, subsequent encounter: Secondary | ICD-10-CM | POA: Diagnosis not present

## 2016-12-11 DIAGNOSIS — M47817 Spondylosis without myelopathy or radiculopathy, lumbosacral region: Secondary | ICD-10-CM | POA: Diagnosis not present

## 2016-12-11 DIAGNOSIS — R972 Elevated prostate specific antigen [PSA]: Secondary | ICD-10-CM | POA: Diagnosis not present

## 2016-12-12 DIAGNOSIS — M47817 Spondylosis without myelopathy or radiculopathy, lumbosacral region: Secondary | ICD-10-CM | POA: Diagnosis not present

## 2016-12-12 DIAGNOSIS — R05 Cough: Secondary | ICD-10-CM | POA: Diagnosis not present

## 2016-12-12 DIAGNOSIS — M25562 Pain in left knee: Secondary | ICD-10-CM | POA: Diagnosis not present

## 2016-12-12 DIAGNOSIS — S86112D Strain of other muscle(s) and tendon(s) of posterior muscle group at lower leg level, left leg, subsequent encounter: Secondary | ICD-10-CM | POA: Diagnosis not present

## 2016-12-12 DIAGNOSIS — M545 Low back pain: Secondary | ICD-10-CM | POA: Diagnosis not present

## 2016-12-12 DIAGNOSIS — I480 Paroxysmal atrial fibrillation: Secondary | ICD-10-CM | POA: Diagnosis not present

## 2016-12-13 ENCOUNTER — Encounter: Payer: Self-pay | Admitting: Internal Medicine

## 2016-12-13 ENCOUNTER — Ambulatory Visit (INDEPENDENT_AMBULATORY_CARE_PROVIDER_SITE_OTHER): Payer: Medicare Other | Admitting: Internal Medicine

## 2016-12-13 VITALS — BP 106/74 | HR 80 | Ht 67.0 in | Wt 144.2 lb

## 2016-12-13 DIAGNOSIS — I5022 Chronic systolic (congestive) heart failure: Secondary | ICD-10-CM | POA: Diagnosis not present

## 2016-12-13 DIAGNOSIS — I480 Paroxysmal atrial fibrillation: Secondary | ICD-10-CM

## 2016-12-13 DIAGNOSIS — I255 Ischemic cardiomyopathy: Secondary | ICD-10-CM | POA: Diagnosis not present

## 2016-12-13 DIAGNOSIS — Z95810 Presence of automatic (implantable) cardiac defibrillator: Secondary | ICD-10-CM

## 2016-12-13 NOTE — Patient Instructions (Signed)
Medication Instructions: Your physician has recommended you make the following change in your medication:  -1) In 10 days decrease Torsemide - Take 1 tablet by mouth daily -2) In 10 days decrease Potassium 5 meq - Take 1 tablet by mouth daily  Labwork: None Ordered  Procedures/Testing: None Ordered  Follow-Up: Your physician wants you to follow-up in: 9 MONTHS with Dawayne Cirri, NP.  You will receive a reminder letter in the mail two months in advance. If you don't receive a letter, please call our office to schedule the follow-up appointment.  Remote monitoring is used to monitor your Pacemaker from home. This monitoring reduces the number of office visits required to check your device to one time per year. It allows [name] to keep an eye on the functioning of your device to ensure it is working properly. You are scheduled for a device check from home on 03/14/17. You may send your transmission at any time that day. If you have a wireless device, the transmission will be sent automatically. After your physician reviews your transmission, you will receive a postcard with your next transmission date.    Any Additional Special Instructions Will Be Listed Below (If Applicable).     If you need a refill on your cardiac medications before your next appointment, please call your pharmacy.

## 2016-12-13 NOTE — Progress Notes (Signed)
Patient Care Team: [name], MD as PCP - General (Family Medicine)   HPI  [name] is a 76 y.o. 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 He has a history of bypass surgery.  His last nuclear perfusion study was in November 2013 which showed a large area of scar in the LAD territory (extent 44%) involving the mid to apical anterior, apical and infero-apical to mid infero-septal and apical lateral wall without associated ischemia.  DATE TEST    3/16    Echo   EF 20-25 %   12/17    Echo   EF 20-25 %             He has a history of paroxysmal atrial fibrillation and is on warfarin. hhe's hospitalized 5/16 and was started on dofetilide. This was subsequently discontinued  Thankfully he has held sinus rhythm  He underwent CRT upgrade 8/18.  This was complicated by a wound hematoma Overall his energy is improved.  His wife says his breathing is improved.  He has no edema.  His denies chest pain  No lightheadedness   No afib   No bleeding he is still on Coumadin..   Past Medical History:  Diagnosis Date  . Acute on chronic systolic CHF (congestive heart failure) ([place]) 06/09/2015  . 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 Cardiomyopathy, ischemic 2011   with EF 25-35% by echo  . Coronary artery disease    Hx MI 1992, CABG 1998 , Nuc study 11.2013 large scar but no ischemia  . GERD (gastroesophageal reflux disease)   . H. pylori infection    Hx of   . 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 NSVT (nonsustained ventricular tachycardia) ([place])   . Paroxysmal atrial fibrillation [facility])     Past Surgical History:  Procedure Laterality Date  . BIV UPGRADE N/A 09/10/2016   Procedure: BiV ICD Upgrade;  Surgeon: Deboraha Sprang, MD;  Location: [facility];  Service: Cardiovascular;  Laterality: N/A;  . CATARACT  EXTRACTION, BILATERAL    . CORONARY ARTERY BYPASS GRAFT  1998   x 5  . HERNIA REPAIR  1960's   right inguinal  . ICD GENERATOR CHANGE  01/2008   medtronic, hx+ EP study  . KNEE SURGERY     right  . NASAL SINUS SURGERY  05/31/2010   Dr. Benjamine Mola    Current Outpatient Medications  Medication Sig Dispense Refill  . acetaminophen (TYLENOL) 650 MG CR tablet Take 650 mg by mouth 2 (two) times daily.     Marland Kitchen allopurinol (ZYLOPRIM) 300 MG tablet Take 300 mg by mouth daily.    . bisoprolol (ZEBETA) 5 MG tablet Take 0.5 tablets (2.5 mg total) daily by mouth. 45 tablet 3  . cetirizine (ZYRTEC) 10 MG tablet Take 10 mg by mouth daily.      . Cholecalciferol (VITAMIN D) 2000 UNITS CAPS Take 2,000 Units by mouth daily at 12 noon.     . colchicine 0.6 MG tablet Take 0.6 mg by mouth daily as needed.    . dicyclomine (BENTYL) 10 MG capsule Take 1 capsule (10 mg total) by mouth every 8 (eight) hours as needed for spasms. 90 capsule 1  . ELIQUIS 5 MG TABS tablet Take 1 tablet (5 mg total) by mouth 2 (two) times daily. 28 tablet  0  . isosorbide mononitrate (IMDUR) 30 MG 24 hr tablet Take 15 mg by mouth daily.    Marland Kitchen LORazepam (ATIVAN) 1 MG tablet Take 0.25-0.5 mg by mouth at bedtime.     Marland Kitchen losartan (COZAAR) 25 MG tablet Take 1 tablet (25 mg total) by mouth at bedtime. 90 tablet 3  . magnesium oxide (MAG-OX) 400 MG tablet Take 400 mg by mouth daily at 12 noon.     . metolazone (ZAROXOLYN) 2.5 MG tablet Take 1 tablet (2.5 mg total) by mouth daily as needed (if weight 135 lbs or more). Take 40 meq potassium if you take this pill. 2 tablet 0  . mometasone (ASMANEX) 220 MCG/INH inhaler Inhale 1 puff into the lungs daily.     . montelukast (SINGULAIR) 10 MG tablet Take 1 tablet (10 mg total) by mouth at bedtime. PLEASE SCHEDULE APPOINTMENT. 30 tablet 0  . nitroGLYCERIN (NITROSTAT) 0.4 MG SL tablet Place 1 tablet (0.4 mg total) under the tongue every 5 (five) minutes as needed for chest pain. 25 tablet 1  . omeprazole  (PRILOSEC) 20 MG capsule Take 20 mg by mouth every morning.    Vladimir Faster Glycol-Propyl Glycol (SYSTANE OP) Apply 2-3 drops to eye 3 (three) times daily as needed (dry eyes).    . potassium chloride (K-DUR) 10 MEQ tablet Take 1 tablet (10 mEq total) by mouth daily. 30 tablet 6  . predniSONE (DELTASONE) 10 MG tablet Take 10 mg by mouth daily.    . silodosin (RAPAFLO) 8 MG CAPS capsule Take 8 mg by mouth every other day.    . simvastatin (ZOCOR) 40 MG tablet TAKE ONE TABLET BY MOUTH AT BEDTIME 90 tablet 2  . spironolactone (ALDACTONE) 25 MG tablet Take 1 tablet (25 mg total) by mouth daily. 90 tablet 3  . torsemide (DEMADEX) 20 MG tablet Take 1 tablet (20 mg total) by mouth 2 (two) times daily. 180 tablet 3  . warfarin (COUMADIN) 5 MG tablet Monday 5 mg Tuesday 2.5mg  Wednesday 5 mg Thursday 2.5mg  Friday 5mg  Saturday 2.5mg  Sunday 5mg  Monday 2.5mg  30 tablet 1   No current facility-administered medications for this visit.     Allergies  Allergen Reactions  . Amoxicillin Rash    Has patient had a PCN reaction causing immediate rash, facial/tongue/throat swelling, SOB or lightheadedness with hypotension: Yes Has patient had a PCN reaction causing severe rash involving mucus membranes or skin necrosis: No Has patient had a PCN reaction that required hospitalization: No Has patient had a PCN reaction occurring within the last 10 years: No If all of the above answers are "NO", then may proceed with Cephalosporin use.   Marland Kitchen Penicillins Rash    Has patient had a PCN reaction causing immediate rash, facial/tongue/throat swelling, SOB or lightheadedness with hypotension: Yes Has patient had a PCN reaction causing severe rash involving mucus membranes or skin necrosis: No Has patient had a PCN reaction that required hospitalization: No Has patient had a PCN reaction occurring within the last 10 years: No If all of the above answers are "NO", then may proceed with Cephalosporin use.     Review  of Systems negative except from HPI and PMH  Physical Exam BP 106/74   Pulse 80   Ht 5\' 7"  (1.702 m)   Wt 144 lb 3.2 oz (65.4 kg)   SpO2 95%   BMI 22.58 kg/m  Well developed and nourished in no acute distress HENT normal Neck supple with JVP-flat Device pocket well healed; without hematoma  or erythema.  There is no tethering  Right-sided lead pocket is also well-healed Carotids brisk and full without bruits Clear Regular rate and rhythm, no murmurs or gallops Abd-soft with active BS without hepatomegaly No Clubbing cyanosis edema Skin-warm and dry A & Oriented  Grossly normal sensory and motor function   ECG demonstrates AV pacing.  QRS is negative in lead I and biphasic R/S in lead V1    Assessment and  Plan  Atrial fibrillation-paroxysmal  Sinus bradycardia/poor tropic incompetence  Ischemic cardiomyopathy  IVCD  Congestive heart failure-chronic-systolic  CRT ICD Medtronic     Transient atrial fibrillation in early August following device implantation none since.  No interval ventricular tachycardia  Atrial pacing 85% of the time  He is much better we will have him decrease his torsemide from twice daily--daily of 10 days prior to his visit with Dr. Saundra Shelling.  He is euvolemic.  No ischemia.  On Anticoagulation;  No bleeding issues he would like to resume Eliquis.  We will have him switched over when he returns to the Coumadin clinic next week.  He asked for handicapped sticker but at this point his functional status is significantly improved.  I will defer this decision to Dr. DD

## 2016-12-17 DIAGNOSIS — M76821 Posterior tibial tendinitis, right leg: Secondary | ICD-10-CM | POA: Diagnosis not present

## 2016-12-17 DIAGNOSIS — M79671 Pain in right foot: Secondary | ICD-10-CM | POA: Diagnosis not present

## 2016-12-17 DIAGNOSIS — M25562 Pain in left knee: Secondary | ICD-10-CM | POA: Diagnosis not present

## 2016-12-17 DIAGNOSIS — M545 Low back pain: Secondary | ICD-10-CM | POA: Diagnosis not present

## 2016-12-17 DIAGNOSIS — M47817 Spondylosis without myelopathy or radiculopathy, lumbosacral region: Secondary | ICD-10-CM | POA: Diagnosis not present

## 2016-12-17 DIAGNOSIS — S86112D Strain of other muscle(s) and tendon(s) of posterior muscle group at lower leg level, left leg, subsequent encounter: Secondary | ICD-10-CM | POA: Diagnosis not present

## 2016-12-18 ENCOUNTER — Ambulatory Visit (INDEPENDENT_AMBULATORY_CARE_PROVIDER_SITE_OTHER): Payer: Medicare Other | Admitting: Pharmacist Clinician (PhC)/ Clinical Pharmacy Specialist

## 2016-12-18 DIAGNOSIS — Z7901 Long term (current) use of anticoagulants: Secondary | ICD-10-CM

## 2016-12-18 DIAGNOSIS — I4891 Unspecified atrial fibrillation: Secondary | ICD-10-CM

## 2016-12-18 DIAGNOSIS — R972 Elevated prostate specific antigen [PSA]: Secondary | ICD-10-CM | POA: Diagnosis not present

## 2016-12-18 DIAGNOSIS — I480 Paroxysmal atrial fibrillation: Secondary | ICD-10-CM | POA: Diagnosis not present

## 2016-12-18 DIAGNOSIS — N400 Enlarged prostate without lower urinary tract symptoms: Secondary | ICD-10-CM | POA: Diagnosis not present

## 2016-12-18 DIAGNOSIS — Z5181 Encounter for therapeutic drug level monitoring: Secondary | ICD-10-CM

## 2016-12-18 LAB — POCT INR: INR: 1.8

## 2016-12-18 MED ORDER — WARFARIN SODIUM 5 MG PO TABS: ORAL_TABLET | ORAL | 1 refills | Status: DC

## 2016-12-19 DIAGNOSIS — M47817 Spondylosis without myelopathy or radiculopathy, lumbosacral region: Secondary | ICD-10-CM | POA: Diagnosis not present

## 2016-12-19 DIAGNOSIS — M25562 Pain in left knee: Secondary | ICD-10-CM | POA: Diagnosis not present

## 2016-12-19 DIAGNOSIS — M545 Low back pain: Secondary | ICD-10-CM | POA: Diagnosis not present

## 2016-12-19 DIAGNOSIS — S86112D Strain of other muscle(s) and tendon(s) of posterior muscle group at lower leg level, left leg, subsequent encounter: Secondary | ICD-10-CM | POA: Diagnosis not present

## 2016-12-24 DIAGNOSIS — M47817 Spondylosis without myelopathy or radiculopathy, lumbosacral region: Secondary | ICD-10-CM | POA: Diagnosis not present

## 2016-12-24 DIAGNOSIS — S86112D Strain of other muscle(s) and tendon(s) of posterior muscle group at lower leg level, left leg, subsequent encounter: Secondary | ICD-10-CM | POA: Diagnosis not present

## 2016-12-24 DIAGNOSIS — M25562 Pain in left knee: Secondary | ICD-10-CM | POA: Diagnosis not present

## 2016-12-24 DIAGNOSIS — M545 Low back pain: Secondary | ICD-10-CM | POA: Diagnosis not present

## 2016-12-25 DIAGNOSIS — R3914 Feeling of incomplete bladder emptying: Secondary | ICD-10-CM | POA: Diagnosis not present

## 2016-12-25 DIAGNOSIS — R3982 Chronic bladder pain: Secondary | ICD-10-CM | POA: Diagnosis not present

## 2016-12-26 DIAGNOSIS — M47817 Spondylosis without myelopathy or radiculopathy, lumbosacral region: Secondary | ICD-10-CM | POA: Diagnosis not present

## 2016-12-26 DIAGNOSIS — M25562 Pain in left knee: Secondary | ICD-10-CM | POA: Diagnosis not present

## 2016-12-26 DIAGNOSIS — S86112D Strain of other muscle(s) and tendon(s) of posterior muscle group at lower leg level, left leg, subsequent encounter: Secondary | ICD-10-CM | POA: Diagnosis not present

## 2016-12-26 DIAGNOSIS — M545 Low back pain: Secondary | ICD-10-CM | POA: Diagnosis not present

## 2016-12-27 DIAGNOSIS — H353211 Exudative age-related macular degeneration, right eye, with active choroidal neovascularization: Secondary | ICD-10-CM | POA: Diagnosis not present

## 2016-12-27 DIAGNOSIS — H353122 Nonexudative age-related macular degeneration, left eye, intermediate dry stage: Secondary | ICD-10-CM | POA: Diagnosis not present

## 2016-12-31 DIAGNOSIS — M25562 Pain in left knee: Secondary | ICD-10-CM | POA: Diagnosis not present

## 2016-12-31 DIAGNOSIS — M545 Low back pain: Secondary | ICD-10-CM | POA: Diagnosis not present

## 2017-01-01 ENCOUNTER — Other Ambulatory Visit: Payer: Medicare Other

## 2017-01-01 DIAGNOSIS — Z95810 Presence of automatic (implantable) cardiac defibrillator: Secondary | ICD-10-CM | POA: Diagnosis not present

## 2017-01-01 DIAGNOSIS — I255 Ischemic cardiomyopathy: Secondary | ICD-10-CM

## 2017-01-01 DIAGNOSIS — I480 Paroxysmal atrial fibrillation: Secondary | ICD-10-CM

## 2017-01-01 DIAGNOSIS — I5022 Chronic systolic (congestive) heart failure: Secondary | ICD-10-CM

## 2017-01-01 LAB — BASIC METABOLIC PANEL
BUN / CREAT RATIO: 16 (ref 10–24)
BUN: 13 mg/dL (ref 8–27)
CO2: 25 mmol/L (ref 20–29)
CREATININE: 0.79 mg/dL (ref 0.76–1.27)
Calcium: 9.7 mg/dL (ref 8.6–10.2)
Chloride: 101 mmol/L (ref 96–106)
GFR calc Af Amer: 100 mL/min/{1.73_m2} (ref >59)
GFR, EST NON AFRICAN AMERICAN: 87 mL/min/{1.73_m2} (ref >59)
GLUCOSE: 78 mg/dL (ref 65–99)
Potassium: 4.5 mmol/L (ref 3.5–5.2)
SODIUM: 141 mmol/L (ref 134–144)

## 2017-01-02 ENCOUNTER — Ambulatory Visit (HOSPITAL_COMMUNITY)
Admission: RE | Admit: 2017-01-02 | Discharge: 2017-01-02 | Disposition: A | Discharge status: Home / Self Care | Payer: Medicare Other | Source: Ambulatory Visit | Attending: Internal Medicine | Admitting: Internal Medicine

## 2017-01-02 ENCOUNTER — Encounter (HOSPITAL_COMMUNITY): Payer: Self-pay | Admitting: Internal Medicine

## 2017-01-02 ENCOUNTER — Other Ambulatory Visit: Payer: Self-pay

## 2017-01-02 ENCOUNTER — Ambulatory Visit (INDEPENDENT_AMBULATORY_CARE_PROVIDER_SITE_OTHER): Payer: Medicare Other | Admitting: Pharmacist Clinician (PhC)/ Clinical Pharmacy Specialist

## 2017-01-02 VITALS — BP 108/64 | HR 62 | Wt 145.0 lb

## 2017-01-02 DIAGNOSIS — I2510 Atherosclerotic heart disease of native coronary artery without angina pectoris: Secondary | ICD-10-CM | POA: Diagnosis not present

## 2017-01-02 DIAGNOSIS — I4891 Unspecified atrial fibrillation: Secondary | ICD-10-CM | POA: Diagnosis not present

## 2017-01-02 DIAGNOSIS — K219 Gastro-esophageal reflux disease without esophagitis: Secondary | ICD-10-CM | POA: Diagnosis not present

## 2017-01-02 DIAGNOSIS — I480 Paroxysmal atrial fibrillation: Secondary | ICD-10-CM | POA: Insufficient documentation

## 2017-01-02 DIAGNOSIS — I252 Old myocardial infarction: Secondary | ICD-10-CM | POA: Insufficient documentation

## 2017-01-02 DIAGNOSIS — Z79899 Other long term (current) drug therapy: Secondary | ICD-10-CM | POA: Diagnosis not present

## 2017-01-02 DIAGNOSIS — Z951 Presence of aortocoronary bypass graft: Secondary | ICD-10-CM | POA: Insufficient documentation

## 2017-01-02 DIAGNOSIS — Z823 Family history of stroke: Secondary | ICD-10-CM | POA: Diagnosis not present

## 2017-01-02 DIAGNOSIS — I447 Left bundle-branch block, unspecified: Secondary | ICD-10-CM | POA: Insufficient documentation

## 2017-01-02 DIAGNOSIS — Z7901 Long term (current) use of anticoagulants: Secondary | ICD-10-CM | POA: Diagnosis not present

## 2017-01-02 DIAGNOSIS — Z95810 Presence of automatic (implantable) cardiac defibrillator: Secondary | ICD-10-CM | POA: Insufficient documentation

## 2017-01-02 DIAGNOSIS — I255 Ischemic cardiomyopathy: Secondary | ICD-10-CM | POA: Insufficient documentation

## 2017-01-02 DIAGNOSIS — I5022 Chronic systolic (congestive) heart failure: Secondary | ICD-10-CM

## 2017-01-02 DIAGNOSIS — Z5181 Encounter for therapeutic drug level monitoring: Secondary | ICD-10-CM

## 2017-01-02 DIAGNOSIS — Z880 Allergy status to penicillin: Secondary | ICD-10-CM | POA: Insufficient documentation

## 2017-01-02 DIAGNOSIS — Z8249 Family history of ischemic heart disease and other diseases of the circulatory system: Secondary | ICD-10-CM | POA: Diagnosis not present

## 2017-01-02 LAB — POCT INR: INR: 1.9

## 2017-01-02 MED ORDER — SACUBITRIL-VALSARTAN 24-26 MG PO TABS: 1.0000 | ORAL_TABLET | Freq: Two times a day (BID) | ORAL | 3 refills | Status: DC

## 2017-01-02 NOTE — Progress Notes (Signed)
Patient ID: QUAN CYBULSKI, male   DOB: 1940-08-21, 76 y.o.   MRN: [id number]    Advanced Heart Failure Clinic Note   Date:  01/02/2017   ID:  Joseeduardo, Brix 02/10/1940, MRN [id number]  PCP:  [name], MD  Primary Cardiologist:  [name] Referring: Dr. [name]   History of Present Illness: DAVINDER HAFF is a 76 y.o. 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 In September 1998 he underwent CABG revascularization surgery after an unsuccessful attempt at stenting of his proximal LAD by [name]. Ejection fraction was 25-30%. In 2002 he underwent initial ICD implantation for nonsustained ventricular tachycardia documented on event monitor for primary prevention. In January 2010 he underwent generator change with a Medtronic Virtuoso single chamber cardioverter defibrillator.  A nuclear perfusion study in November 2013 showed a large area of scar in the LAD territory (extent 44%) involving the mid to apical anterior,apical and infero-apical to mid infero-septal and apical lateral wall without associated ischemia.  He is on Coumadin anticoagulation for PAF. He was readmitted to the hospital from a May 2 through 06/04/2014 with recurrent atrial fibrillation and started on Tikosyn. He was enrolled in the genetic AF trial (bucindolol vs Toprol). He did poorly in the trial in the setting of titration of beta-blocker. He was referred to HF Clinic. Genetic AF study completed for pt. He is now on bisoprol 2.5 mg daily.   He has been discussing CRT upgrade with Dr. 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    He presents today for routine follow-up.  Had device upgrade to CRT 09/11/15. Post-procedure complicated by large chest wall hematoma which is now healed. Says he feels better since upgrade. Has more energy.  Much more consistent. No edema, orthopnea or PND. Last week Dr. [name] cut diuretics  from torsemide 40 daily to 20 daily. Has not felt any different after that. SBP 120s at home  CPX 02/17/16 Resting HR: 73 Peak HR: 108  (75% age predicted max HR) BP rest: 110/58 BP peak: 150/56 Peak VO2: 14.4 (58% predicted peak VO2) VE/VCO2 slope: 33 OUES: 1.32 Peak RER: 1.10 PETCO2 at peak: 33 O2pulse: 9  (82% predicted O2pulse)  Echo 12/17 EF 15-20%, Moderate MR, RV with reduced function Echo 5/17 EF 20% RV mildly down.   Optivol interrogation today shows fluid index well below threshold. Thoracic impedence stable. Pt activity 2 hrs a day. No AT/VT/VF.  Labs 10/04/15 K 5.3, Creatinine 0.85, BUN 15  Past Medical History:  Diagnosis Date  . Acute on chronic systolic CHF (congestive heart failure) ([place]) 06/09/2015  . 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 Cardiomyopathy, ischemic 2011   with EF 25-35% by echo  . Coronary artery disease    Hx MI 1992, CABG 1998 , Nuc study 11.2013 large scar but no ischemia  . GERD (gastroesophageal reflux disease)   . H. pylori infection    Hx of   . 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 NSVT (nonsustained ventricular tachycardia) ([place])   . Paroxysmal atrial fibrillation ([facility])     Current Outpatient Medications  Medication Sig Dispense Refill  . acetaminophen (TYLENOL) 650 MG CR tablet Take 650 mg by mouth 2 (two) times daily.     Marland Kitchen  allopurinol (ZYLOPRIM) 300 MG tablet Take 300 mg by mouth daily.    . bisoprolol (ZEBETA) 5 MG tablet Take 0.5 tablets (2.5 mg total) daily by mouth. 45 tablet 3  . cetirizine (ZYRTEC) 10 MG tablet Take 10 mg by mouth daily.      . Cholecalciferol (VITAMIN D) 2000 UNITS CAPS Take 2,000 Units by mouth daily at 12 noon.     . colchicine 0.6 MG tablet Take 0.6 mg by mouth daily as needed.    . dicyclomine (BENTYL) 10 MG capsule Take 1  capsule (10 mg total) by mouth every 8 (eight) hours as needed for spasms. 90 capsule 1  . isosorbide mononitrate (IMDUR) 30 MG 24 hr tablet Take 15 mg by mouth daily.    Marland Kitchen LORazepam (ATIVAN) 1 MG tablet Take 0.25-0.5 mg by mouth as needed.     Marland Kitchen losartan (COZAAR) 25 MG tablet Take 1 tablet (25 mg total) by mouth at bedtime. 90 tablet 3  . magnesium oxide (MAG-OX) 400 MG tablet Take 400 mg by mouth daily at 12 noon.     . metolazone (ZAROXOLYN) 2.5 MG tablet Take 1 tablet (2.5 mg total) by mouth daily as needed (if weight 135 lbs or more). Take 40 meq potassium if you take this pill. 2 tablet 0  . mometasone (ASMANEX) 220 MCG/INH inhaler Inhale 1 puff into the lungs daily.     . montelukast (SINGULAIR) 10 MG tablet Take 1 tablet (10 mg total) by mouth at bedtime. PLEASE SCHEDULE APPOINTMENT. 30 tablet 0  . nitroGLYCERIN (NITROSTAT) 0.4 MG SL tablet Place 1 tablet (0.4 mg total) under the tongue every 5 (five) minutes as needed for chest pain. 25 tablet 1  . omeprazole (PRILOSEC) 20 MG capsule Take 20 mg by mouth every morning.    Vladimir Faster Glycol-Propyl Glycol (SYSTANE OP) Apply 2-3 drops to eye 3 (three) times daily as needed (dry eyes).    . potassium chloride (K-DUR) 10 MEQ tablet Take 1 tablet (10 mEq total) by mouth daily. (Patient taking differently: Take 5 mEq by mouth daily. ) 30 tablet 6  . silodosin (RAPAFLO) 8 MG CAPS capsule Take 8 mg by mouth every other day.    . simvastatin (ZOCOR) 40 MG tablet TAKE ONE TABLET BY MOUTH AT BEDTIME 90 tablet 2  . spironolactone (ALDACTONE) 25 MG tablet Take 1 tablet (25 mg total) by mouth daily. 90 tablet 3  . torsemide (DEMADEX) 20 MG tablet Take 1 tablet (20 mg total) by mouth 2 (two) times daily. (Patient taking differently: Take 20 mg by mouth once. ) 180 tablet 3  . warfarin (COUMADIN) 5 MG tablet Take 1/2 to 1 tablet by mouth daily as directed by coumadin clinic 90 tablet 1   No current facility-administered medications for this encounter.      Allergies:    Allergies  Allergen Reactions  . Amoxicillin Rash    Has patient had a PCN reaction causing immediate rash, facial/tongue/throat swelling, SOB or lightheadedness with hypotension: Yes Has patient had a PCN reaction causing severe rash involving mucus membranes or skin necrosis: No Has patient had a PCN reaction that required hospitalization: No Has patient had a PCN reaction occurring within the last 10 years: No If all of the above answers are "NO", then may proceed with Cephalosporin use.   Marland Kitchen Penicillins Rash    Has patient had a PCN reaction causing immediate rash, facial/tongue/throat swelling, SOB or lightheadedness with hypotension: Yes Has patient had a PCN reaction causing  severe rash involving mucus membranes or skin necrosis: No Has patient had a PCN reaction that required hospitalization: No Has patient had a PCN reaction occurring within the last 10 years: No If all of the above answers are "NO", then may proceed with Cephalosporin use.     Social History:  The patient  reports that  has never smoked. he has never used smokeless tobacco. He reports that he drinks about 1.2 oz of alcohol per week. He reports that he does not use drugs.   Family history:   Family History  Problem Relation Age of Onset  . Heart disease Father        questionable  . Stroke Mother   . Heart failure Sister   . Healthy Brother   . Healthy Sister   . Parkinsonism Brother   . Colon cancer Neg Hx    ROS:  Please see history of present illness. All other systems reviewed and negative.    PHYSICAL EXAM: VS:  BP 108/64   Pulse 62   Wt 145 lb (65.8 kg)   SpO2 97%   BMI 22.71 kg/m    Wt Readings from Last 3 Encounters:  01/02/17 145 lb (65.8 kg)  12/13/16 144 lb 3.2 oz (65.4 kg)  10/03/16 141 lb 8 oz (64.2 kg)   General:  Well appearing. No resp difficulty HEENT: normal Neck: supple. no JVD. Carotids 2+ bilat; no bruits. No lymphadenopathy or thryomegaly  appreciated. Cor: PMI laterally displaced. Regular rate & rhythm. No rubs, gallops or murmurs. Lungs: clear Abdomen: soft, nontender, nondistended. No hepatosplenomegaly. No bruits or masses. Good bowel sounds. Extremities: no cyanosis, clubbing, rash, edema Neuro: alert & orientedx3, cranial nerves grossly intact. moves all 4 extremities w/o difficulty. Affect pleasant   ASSESSMENT AND PLAN:  1. Chronic systolic HF due to iCM, Echo 12/2015 EF 20 - CPX from 1/18  shows moderate HF limitation - Improved with CRT. Now NYHA II. BP improved - Volume status trendign up slightly on ICD interrogation (No VT)  - Will stop losartan. Try Entresto 24/26 bid - Keep torsemide at 20 daily (recently decreased) - BMET 1 week  2. PAF - Maintaining NSR. Back on warfarin now (was on Eliquis before hematoma). Wants to stay on warfarin for cost sake. ..  3. CAD s/p previous anterior infarct -no s/s ischemia. Off ASA due to warfarin. Continue statin and b-blocker 4. LBBB - now s/p CRT 5. ICD hematoma - resolved  Quillian Quince Bensimhon,MD 3:00 PM

## 2017-01-02 NOTE — Patient Instructions (Signed)
Stop Losartan  Start Entresto 24/26 mg (1 tab), twice a day  Your physician recommends that you return for lab work in: 2 weeks   Your physician has requested that you have an echocardiogram. Echocardiography is a painless test that uses sound waves to create images of your heart. It provides your doctor with information about the size and shape of your heart and how well your heart's chambers and valves are working. This procedure takes approximately one hour. There are no restrictions for this procedure.   Your physician recommends that you schedule a follow-up appointment in: 3 months with Dr. [name] and an echocardiogram

## 2017-01-05 ENCOUNTER — Other Ambulatory Visit (HOSPITAL_COMMUNITY): Payer: Self-pay | Admitting: Internal Medicine

## 2017-01-09 LAB — CUP PACEART INCLINIC DEVICE CHECK
Battery Voltage: 3.05 V
Brady Statistic AS VP Percent: 9.25 %
Brady Statistic RA Percent Paced: 88.69 %
HIGH POWER IMPEDANCE MEASURED VALUE: 50 Ohm
HighPow Impedance: 88 Ohm
Implantable Lead Implant Date: 20010806
Implantable Lead Implant Date: 20180813
Implantable Lead Location: 753859
Implantable Lead Location: 753860
Implantable Lead Model: 5092
Lead Channel Impedance Value: 232.653
Lead Channel Impedance Value: 237.5 Ohm
Lead Channel Impedance Value: 246.635
Lead Channel Impedance Value: 456 Ohm
Lead Channel Impedance Value: 475 Ohm
Lead Channel Impedance Value: 513 Ohm
Lead Channel Impedance Value: 779 Ohm
Lead Channel Impedance Value: 779 Ohm
Lead Channel Impedance Value: 817 Ohm
Lead Channel Impedance Value: 817 Ohm
Lead Channel Impedance Value: 817 Ohm
Lead Channel Impedance Value: 836 Ohm
Lead Channel Impedance Value: 893 Ohm
Lead Channel Pacing Threshold Amplitude: 0.5 V
Lead Channel Pacing Threshold Amplitude: 1.25 V
Lead Channel Pacing Threshold Pulse Width: 0.4 ms
Lead Channel Pacing Threshold Pulse Width: 0.4 ms
Lead Channel Sensing Intrinsic Amplitude: 3 mV
Lead Channel Setting Pacing Amplitude: 2 V
Lead Channel Setting Pacing Pulse Width: 0.4 ms
Lead Channel Setting Sensing Sensitivity: 0.3 mV
MDC IDC LEAD IMPLANT DT: 20180813
MDC IDC LEAD LOCATION: 753858
MDC IDC LEAD SERIAL: 104581
MDC IDC MSMT BATTERY REMAINING LONGEVITY: 109 mo
MDC IDC MSMT LEADCHNL LV IMPEDANCE VALUE: 232.653
MDC IDC MSMT LEADCHNL LV IMPEDANCE VALUE: 241.412
MDC IDC MSMT LEADCHNL LV IMPEDANCE VALUE: 475 Ohm
MDC IDC MSMT LEADCHNL LV IMPEDANCE VALUE: 513 Ohm
MDC IDC MSMT LEADCHNL LV IMPEDANCE VALUE: 817 Ohm
MDC IDC MSMT LEADCHNL LV PACING THRESHOLD AMPLITUDE: 1 V
MDC IDC MSMT LEADCHNL RV PACING THRESHOLD PULSEWIDTH: 0.4 ms
MDC IDC MSMT LEADCHNL RV SENSING INTR AMPL: 22.375 mV
MDC IDC PG IMPLANT DT: 20180813
MDC IDC SESS DTM: 20181115213213
MDC IDC SET LEADCHNL LV PACING AMPLITUDE: 1.75 V
MDC IDC SET LEADCHNL LV PACING PULSEWIDTH: 0.4 ms
MDC IDC SET LEADCHNL RV PACING AMPLITUDE: 2.5 V
MDC IDC STAT BRADY AP VP PERCENT: 88.97 %
MDC IDC STAT BRADY AP VS PERCENT: 1.58 %
MDC IDC STAT BRADY AS VS PERCENT: 0.21 %
MDC IDC STAT BRADY RV PERCENT PACED: 0.54 %

## 2017-01-10 ENCOUNTER — Telehealth (HOSPITAL_COMMUNITY): Payer: Self-pay

## 2017-01-10 DIAGNOSIS — N401 Enlarged prostate with lower urinary tract symptoms: Secondary | ICD-10-CM | POA: Diagnosis not present

## 2017-01-10 DIAGNOSIS — R3914 Feeling of incomplete bladder emptying: Secondary | ICD-10-CM | POA: Diagnosis not present

## 2017-01-10 DIAGNOSIS — R3982 Chronic bladder pain: Secondary | ICD-10-CM | POA: Diagnosis not present

## 2017-01-10 DIAGNOSIS — M76821 Posterior tibial tendinitis, right leg: Secondary | ICD-10-CM | POA: Diagnosis not present

## 2017-01-10 DIAGNOSIS — M79671 Pain in right foot: Secondary | ICD-10-CM | POA: Diagnosis not present

## 2017-01-10 DIAGNOSIS — M79672 Pain in left foot: Secondary | ICD-10-CM | POA: Diagnosis not present

## 2017-01-10 MED ORDER — LOSARTAN POTASSIUM 25 MG PO TABS: 25.0000 mg | ORAL_TABLET | Freq: Every day | ORAL | 3 refills | Status: DC

## 2017-01-10 NOTE — Telephone Encounter (Signed)
Pt aware will switch back to losartan. Has had no wt. Gain. meds changed in [facility]

## 2017-01-10 NOTE — Telephone Encounter (Signed)
Advanced Heart Failure Triage Encounter  Patient Name: [name]  Date of Call: 01/10/17  Problem:  Pt was started on Entresto last visit with Dr. [name] on 12/5. Pt stated when he took meds for the first day (yesterday)he started itiching and did not take today wants to go back to Losartan. Pt denies any other issues.   Plan:    Shirley Muscat, RN

## 2017-01-10 NOTE — Telephone Encounter (Signed)
OK. Will forward to DB as well. Systolic pressure was 225 at previous visit so would likely resume Losartan at 25 mg qhs as he was doing previously.   Thought to be mildly overloaded at that visit. If weight still up can take extra 20 mg torsemide (1 tab) as needed.    Legrand Como [address]" Vincent, PA-C 01/10/2017 1:22 PM

## 2017-01-11 ENCOUNTER — Other Ambulatory Visit (HOSPITAL_COMMUNITY): Payer: Self-pay | Admitting: Internal Medicine

## 2017-01-14 ENCOUNTER — Ambulatory Visit (INDEPENDENT_AMBULATORY_CARE_PROVIDER_SITE_OTHER): Payer: Medicare Other

## 2017-01-14 DIAGNOSIS — Z95810 Presence of automatic (implantable) cardiac defibrillator: Secondary | ICD-10-CM | POA: Diagnosis not present

## 2017-01-14 DIAGNOSIS — I5022 Chronic systolic (congestive) heart failure: Secondary | ICD-10-CM | POA: Diagnosis not present

## 2017-01-15 NOTE — Progress Notes (Addendum)
EPIC Encounter for ICM Monitoring  Patient Name: MELODY SAVIDGE is a 76 y.o. male Date: 01/15/2017 Primary Care Physican: [name], MD Primary Cardiologist:Kelly/Bensimhon Electrophysiologist: [name] Dry Weight: 139 lbs BiV Pacing: 97%      Heart Failure questions reviewed, pt asymptomatic.   Thoracic impedance normal.  Prescribed dosage: Torsemide 20 mg 1 tablet daily  Labs: 01/01/2017 Creatinine 0.79, BUN 13, Potassium 4.5, Sodium 141, EGFR 87-100 09/22/2016 Creatinine 0.84, BUN 13, Potassium 3.8, Sodium 135, EGFR >60  09/20/2016 Creatinine 0.90, BUN 19, Potassium 4.1, Sodium 138, EGFR 83-96  09/03/2016 Creatinine 0.98, BUN 12, Potassium 3.4, Sodium 137, EGFR 75-86  06/12/2016 Creatinine 0.86, BUN 14, Potassium 3.9, Sodium 140  03/19/2016 Creatinine 1.13, BUN 16, Potassium 3.4, Sodium 135, EGFR >60  A complete set of results can be found in Results Review.  Recommendations: No changes.   Encouraged to call for fluid symptoms.  Follow-up plan: ICM clinic phone appointment on 02/14/2017.  Copy of ICM check sent to Dr. [name].   3 month ICM trend: 01/14/2017    1 Year ICM trend:       [name], RN 01/15/2017 2:15 PM

## 2017-01-16 ENCOUNTER — Ambulatory Visit (HOSPITAL_COMMUNITY)
Admission: RE | Admit: 2017-01-16 | Discharge: 2017-01-16 | Disposition: A | Discharge status: Home / Self Care | Payer: Medicare Other | Source: Ambulatory Visit | Attending: Cardiology | Admitting: Cardiology

## 2017-01-16 DIAGNOSIS — I5022 Chronic systolic (congestive) heart failure: Secondary | ICD-10-CM | POA: Diagnosis not present

## 2017-01-16 LAB — BASIC METABOLIC PANEL
ANION GAP: 6 (ref 5–15)
BUN: 11 mg/dL (ref 6–20)
CALCIUM: 9.2 mg/dL (ref 8.9–10.3)
CHLORIDE: 97 mmol/L — AB (ref 101–111)
CO2: 29 mmol/L (ref 22–32)
Creatinine, Ser: 0.88 mg/dL (ref 0.61–1.24)
GFR calc non Af Amer: >60 mL/min (ref >60)
Glucose, Bld: 108 mg/dL — ABNORMAL HIGH (ref 65–99)
POTASSIUM: 3.9 mmol/L (ref 3.5–5.1)
Sodium: 132 mmol/L — ABNORMAL LOW (ref 135–145)

## 2017-01-18 ENCOUNTER — Ambulatory Visit (INDEPENDENT_AMBULATORY_CARE_PROVIDER_SITE_OTHER): Payer: Medicare Other | Admitting: Pharmacist Clinician (PhC)/ Clinical Pharmacy Specialist

## 2017-01-18 DIAGNOSIS — Z7901 Long term (current) use of anticoagulants: Secondary | ICD-10-CM | POA: Diagnosis not present

## 2017-01-18 DIAGNOSIS — I4891 Unspecified atrial fibrillation: Secondary | ICD-10-CM | POA: Diagnosis not present

## 2017-01-18 DIAGNOSIS — Z5181 Encounter for therapeutic drug level monitoring: Secondary | ICD-10-CM

## 2017-01-18 DIAGNOSIS — I480 Paroxysmal atrial fibrillation: Secondary | ICD-10-CM | POA: Diagnosis not present

## 2017-01-18 LAB — POCT INR: INR: 2.5

## 2017-01-24 ENCOUNTER — Other Ambulatory Visit: Payer: Self-pay | Admitting: Internal Medicine

## 2017-01-28 DIAGNOSIS — J069 Acute upper respiratory infection, unspecified: Secondary | ICD-10-CM | POA: Diagnosis not present

## 2017-01-31 ENCOUNTER — Telehealth: Payer: Self-pay | Admitting: Cardiovascular Disease

## 2017-01-31 NOTE — Telephone Encounter (Signed)
Pt c/o medication issue:  1. Name of Medication: Losartan 2. How are you currently taking this medication (dosage and times per day)?   3. Are you having a reaction (difficulty breathing--STAT)?   4. What is your medication issue? Recall

## 2017-01-31 NOTE — Telephone Encounter (Signed)
Pt advised on recommendations and is aware to verify manufacturer w his filling pharmacy.  Pt also had question about his coumadin dosing r/t being prescribed a Z-pack. We manage his dosing, last coumadin visit was on 12/21. Pt aware I would route to pharmD for review of antibiotic therapy and any recommendations for return INR monitoring.

## 2017-01-31 NOTE — Telephone Encounter (Signed)
Per FDA recall list:  Only losartan from [employer]

## 2017-02-01 NOTE — Telephone Encounter (Signed)
Patient has rx for azithromycin, is concerned as pharmacist at [facility] told him to skip his warfarin while taking the antibiotic.    Assured patient that he should not skip any warfarin, there is not a significant interaction between these two medications.

## 2017-02-07 DIAGNOSIS — H353122 Nonexudative age-related macular degeneration, left eye, intermediate dry stage: Secondary | ICD-10-CM | POA: Diagnosis not present

## 2017-02-07 DIAGNOSIS — H353211 Exudative age-related macular degeneration, right eye, with active choroidal neovascularization: Secondary | ICD-10-CM | POA: Diagnosis not present

## 2017-02-14 ENCOUNTER — Ambulatory Visit (INDEPENDENT_AMBULATORY_CARE_PROVIDER_SITE_OTHER): Payer: Medicare Other

## 2017-02-14 DIAGNOSIS — Z95810 Presence of automatic (implantable) cardiac defibrillator: Secondary | ICD-10-CM | POA: Diagnosis not present

## 2017-02-14 DIAGNOSIS — I5022 Chronic systolic (congestive) heart failure: Secondary | ICD-10-CM

## 2017-02-14 NOTE — Progress Notes (Signed)
EPIC Encounter for ICM Monitoring  Patient Name: DEDRICK HEFFNER is a 77 y.o. male Date: 02/14/2017 Primary Care Physican: [name], MD Primary Cardiologist:Kelly/Bensimhon Electrophysiologist: [name] Dry Weight:140 lbs BiV Pacing: 97.1%              Heart Failure questions reviewed, pt asymptomatic.   Thoracic impedance normal.  Prescribed dosage: Torsemide 20 mg 1 tablet daily  Labs: 01/01/2017 Creatinine 0.79, BUN 13, Potassium 4.5, Sodium 141, EGFR 87-100 09/22/2016 Creatinine 0.84, BUN 13, Potassium 3.8, Sodium 135, EGFR >60  09/20/2016 Creatinine 0.90, BUN 19, Potassium 4.1, Sodium 138, EGFR 83-96  09/03/2016 Creatinine 0.98, BUN 12, Potassium 3.4, Sodium 137, EGFR 75-86  06/12/2016 Creatinine 0.86, BUN 14, Potassium 3.9, Sodium 140  03/19/2016 Creatinine 1.13, BUN 16, Potassium 3.4, Sodium 135, EGFR >60  A complete set of results can be found in Results Review.  Recommendations: No changes.  Encouraged to call for fluid symptoms.  Follow-up plan: ICM clinic phone appointment on 03/18/2017.    Copy of ICM check sent to Dr. [name].   3 month ICM trend: 02/14/2017    1 Year ICM trend:       [name], RN 02/14/2017 12:24 PM

## 2017-02-15 ENCOUNTER — Ambulatory Visit (INDEPENDENT_AMBULATORY_CARE_PROVIDER_SITE_OTHER): Payer: Medicare Other | Admitting: Pharmacist Clinician (PhC)/ Clinical Pharmacy Specialist

## 2017-02-15 DIAGNOSIS — Z7901 Long term (current) use of anticoagulants: Secondary | ICD-10-CM | POA: Diagnosis not present

## 2017-02-15 DIAGNOSIS — I480 Paroxysmal atrial fibrillation: Secondary | ICD-10-CM

## 2017-02-15 DIAGNOSIS — I4891 Unspecified atrial fibrillation: Secondary | ICD-10-CM | POA: Diagnosis not present

## 2017-02-15 DIAGNOSIS — Z5181 Encounter for therapeutic drug level monitoring: Secondary | ICD-10-CM

## 2017-02-15 LAB — POCT INR: INR: 2.2

## 2017-02-15 NOTE — Patient Instructions (Signed)
Description   Continue with 1 tablet daily except 1/2 tablet each Monday, Wednesday and Friday.  Recheck INR in 4 weeks

## 2017-02-19 DIAGNOSIS — I10 Essential (primary) hypertension: Secondary | ICD-10-CM | POA: Diagnosis not present

## 2017-02-19 DIAGNOSIS — E782 Mixed hyperlipidemia: Secondary | ICD-10-CM | POA: Diagnosis not present

## 2017-02-19 DIAGNOSIS — E559 Vitamin D deficiency, unspecified: Secondary | ICD-10-CM | POA: Diagnosis not present

## 2017-02-21 DIAGNOSIS — M79671 Pain in right foot: Secondary | ICD-10-CM | POA: Diagnosis not present

## 2017-02-21 DIAGNOSIS — M76821 Posterior tibial tendinitis, right leg: Secondary | ICD-10-CM | POA: Diagnosis not present

## 2017-02-21 DIAGNOSIS — M79672 Pain in left foot: Secondary | ICD-10-CM | POA: Diagnosis not present

## 2017-02-21 DIAGNOSIS — D485 Neoplasm of uncertain behavior of skin: Secondary | ICD-10-CM | POA: Diagnosis not present

## 2017-02-21 DIAGNOSIS — G629 Polyneuropathy, unspecified: Secondary | ICD-10-CM | POA: Diagnosis not present

## 2017-02-26 ENCOUNTER — Other Ambulatory Visit (HOSPITAL_COMMUNITY): Payer: Self-pay | Admitting: Internal Medicine

## 2017-03-04 DIAGNOSIS — R202 Paresthesia of skin: Secondary | ICD-10-CM | POA: Diagnosis not present

## 2017-03-13 DIAGNOSIS — M79672 Pain in left foot: Secondary | ICD-10-CM | POA: Diagnosis not present

## 2017-03-13 DIAGNOSIS — M79671 Pain in right foot: Secondary | ICD-10-CM | POA: Diagnosis not present

## 2017-03-15 ENCOUNTER — Ambulatory Visit (INDEPENDENT_AMBULATORY_CARE_PROVIDER_SITE_OTHER): Payer: Medicare Other | Admitting: Pharmacist

## 2017-03-15 DIAGNOSIS — I4891 Unspecified atrial fibrillation: Secondary | ICD-10-CM

## 2017-03-15 DIAGNOSIS — I480 Paroxysmal atrial fibrillation: Secondary | ICD-10-CM

## 2017-03-15 DIAGNOSIS — Z5181 Encounter for therapeutic drug level monitoring: Secondary | ICD-10-CM

## 2017-03-15 DIAGNOSIS — Z7901 Long term (current) use of anticoagulants: Secondary | ICD-10-CM

## 2017-03-15 LAB — POCT INR: INR: 2.4

## 2017-03-18 ENCOUNTER — Ambulatory Visit (INDEPENDENT_AMBULATORY_CARE_PROVIDER_SITE_OTHER): Payer: Medicare Other | Admitting: *Deleted

## 2017-03-18 DIAGNOSIS — I5022 Chronic systolic (congestive) heart failure: Secondary | ICD-10-CM

## 2017-03-18 DIAGNOSIS — Z95810 Presence of automatic (implantable) cardiac defibrillator: Secondary | ICD-10-CM

## 2017-03-18 DIAGNOSIS — I255 Ischemic cardiomyopathy: Secondary | ICD-10-CM

## 2017-03-18 NOTE — Progress Notes (Signed)
EPIC Encounter for ICM Monitoring  Patient Name: [name] is a 77 y.o. male Date: 03/18/2017 Primary Care Physican: [name], MD Primary Cardiologist:Kelly/Bensimhon Electrophysiologist: [name] Dry Weight:140.5lbs BiV Pacing: 97.7%       Heart Failure questions reviewed, pt asymptomatic.   Thoracic impedance normal.  Prescribed dosage: Torsemide 20 mg 1 tabletdaily  Labs: 01/01/2017 Creatinine0.79, BUN13, Potassium4.5, Sodium141, EGFR87-100 09/22/2016 Creatinine0.84, BUN13, Potassium3.8, Sodium135, EGFR>60  09/20/2016 Creatinine0.90, BUN19, Potassium4.1, Sodium138, [id number]  08/06/2018Creatinine 0.98, BUN12, Potassium3.4, Sodium137, [id number]  05/15/2018Creatinine 0.86, BUN14, Potassium3.9, Sodium140  02/19/2018Creatinine 1.13, BUN16, Potassium3.4, Sodium135, EGFR>60 A complete set of results can be found in Results Review  Recommendations: No changes.  Encouraged to call for fluid symptoms.  Follow-up plan: ICM clinic phone appointment on 04/18/2017.  Office appointment scheduled 04/04/2017 with Dr. [name].  Copy of ICM check sent to Dr. [name].   3 month ICM trend: 03/18/2017    1 Year ICM trend:       [name], RN 03/18/2017 11:01 AM

## 2017-03-18 NOTE — Progress Notes (Signed)
Remote ICD transmission.   

## 2017-03-19 LAB — CUP PACEART REMOTE DEVICE CHECK
Battery Remaining Longevity: 106 mo
Battery Voltage: 3.02 V
Brady Statistic AP VP Percent: 90.47 %
Brady Statistic RA Percent Paced: 91.36 %
Brady Statistic RV Percent Paced: 0.64 %
Date Time Interrogation Session: 20190218062304
HIGH POWER IMPEDANCE MEASURED VALUE: 92 Ohm
HighPow Impedance: 55 Ohm
Implantable Lead Implant Date: 20180813
Implantable Lead Location: 753858
Implantable Lead Model: 5092
Implantable Lead Serial Number: 104581
Lead Channel Impedance Value: 230.327
Lead Channel Impedance Value: 234.08 Ohm
Lead Channel Impedance Value: 261.164
Lead Channel Impedance Value: 418 Ohm
Lead Channel Impedance Value: 513 Ohm
Lead Channel Impedance Value: 532 Ohm
Lead Channel Impedance Value: 817 Ohm
Lead Channel Impedance Value: 836 Ohm
Lead Channel Impedance Value: 836 Ohm
Lead Channel Impedance Value: 931 Ohm
Lead Channel Pacing Threshold Amplitude: 1.375 V
Lead Channel Pacing Threshold Pulse Width: 0.4 ms
Lead Channel Pacing Threshold Pulse Width: 0.4 ms
Lead Channel Sensing Intrinsic Amplitude: 2 mV
Lead Channel Sensing Intrinsic Amplitude: 23 mV
Lead Channel Setting Pacing Amplitude: 2 V
Lead Channel Setting Pacing Pulse Width: 0.4 ms
Lead Channel Setting Pacing Pulse Width: 0.4 ms
MDC IDC LEAD IMPLANT DT: 20010806
MDC IDC LEAD IMPLANT DT: 20180813
MDC IDC LEAD LOCATION: 753859
MDC IDC LEAD LOCATION: 753860
MDC IDC MSMT LEADCHNL LV IMPEDANCE VALUE: 230.327
MDC IDC MSMT LEADCHNL LV IMPEDANCE VALUE: 256.5 Ohm
MDC IDC MSMT LEADCHNL LV IMPEDANCE VALUE: 513 Ohm
MDC IDC MSMT LEADCHNL LV IMPEDANCE VALUE: 874 Ohm
MDC IDC MSMT LEADCHNL LV IMPEDANCE VALUE: 874 Ohm
MDC IDC MSMT LEADCHNL LV IMPEDANCE VALUE: 874 Ohm
MDC IDC MSMT LEADCHNL LV PACING THRESHOLD AMPLITUDE: 1.375 V
MDC IDC MSMT LEADCHNL RA IMPEDANCE VALUE: 513 Ohm
MDC IDC MSMT LEADCHNL RA PACING THRESHOLD AMPLITUDE: 0.625 V
MDC IDC MSMT LEADCHNL RA SENSING INTR AMPL: 2 mV
MDC IDC MSMT LEADCHNL RV IMPEDANCE VALUE: 817 Ohm
MDC IDC MSMT LEADCHNL RV PACING THRESHOLD PULSEWIDTH: 0.4 ms
MDC IDC MSMT LEADCHNL RV SENSING INTR AMPL: 23 mV
MDC IDC PG IMPLANT DT: 20180813
MDC IDC SET LEADCHNL LV PACING AMPLITUDE: 1.75 V
MDC IDC SET LEADCHNL RV PACING AMPLITUDE: 2.75 V
MDC IDC SET LEADCHNL RV SENSING SENSITIVITY: 0.3 mV
MDC IDC STAT BRADY AP VS PERCENT: 1.49 %
MDC IDC STAT BRADY AS VP PERCENT: 7.87 %
MDC IDC STAT BRADY AS VS PERCENT: 0.17 %

## 2017-03-20 ENCOUNTER — Telehealth: Payer: Self-pay

## 2017-03-20 ENCOUNTER — Encounter: Payer: Self-pay | Admitting: Cardiology

## 2017-03-20 NOTE — Telephone Encounter (Signed)
Called [name], device site is completely healed, no bruising. Informed [name] of Dr. Gillermina Hu recommendations for the jacuzzi of 10-66mins. [name] voiced understanding

## 2017-03-20 NOTE — Telephone Encounter (Signed)
-----   Message from [name], RN sent at 03/19/2017  4:13 PM EST ----- Regarding: FW: Jacuzzi Can you recommend if patient can get in [place].   Dr [name] replied back to me but pointed out patient recently had a pocket hematoma.  Can you make a recommendation in that respect?    Thanks, Margarita Grizzle   ----- Message ----- From: Jolaine Artist, MD Sent: 03/19/2017   3:55 PM To: Scarlette Calico, RN, [name], RN Subject: RE: Tammi Klippel                                    From a HF perspective it is fine to sit for 10-15 mins.   Margarita Grizzle - You might want to make sure device guys are fine with it. He recently had a pacer pocket hematoma. - db   ----- Message ----- From: Scarlette Calico, RN Sent: 03/19/2017   8:46 AM To: Jolaine Artist, MD Subject: Melton AlarTammi Klippel                                    What are your thoughts?  ----- Message ----- From: [name], RN Sent: 03/18/2017  12:15 PM To: Effie Berkshire, RN, Scarlette Calico, RN Subject: Tammi Klippel                                         He is a patient of Dr [name]. Patient wants to know if he can use the Jacuzzi tub and if so how long can he stay in.  Do you have any recommendations?  Thanks, Margarita Grizzle

## 2017-03-21 DIAGNOSIS — R972 Elevated prostate specific antigen [PSA]: Secondary | ICD-10-CM | POA: Diagnosis not present

## 2017-04-04 ENCOUNTER — Ambulatory Visit (HOSPITAL_BASED_OUTPATIENT_CLINIC_OR_DEPARTMENT_OTHER)
Admission: RE | Admit: 2017-04-04 | Discharge: 2017-04-04 | Disposition: A | Discharge status: Home / Self Care | Payer: Medicare Other | Source: Ambulatory Visit | Attending: Internal Medicine | Admitting: Internal Medicine

## 2017-04-04 ENCOUNTER — Other Ambulatory Visit: Payer: Self-pay

## 2017-04-04 ENCOUNTER — Ambulatory Visit (HOSPITAL_COMMUNITY)
Admission: RE | Admit: 2017-04-04 | Discharge: 2017-04-04 | Disposition: A | Discharge status: Home / Self Care | Payer: Medicare Other | Source: Ambulatory Visit | Attending: Cardiology | Admitting: Cardiology

## 2017-04-04 ENCOUNTER — Encounter (HOSPITAL_COMMUNITY): Payer: Self-pay | Admitting: Internal Medicine

## 2017-04-04 VITALS — BP 130/70 | HR 80 | Wt 146.0 lb

## 2017-04-04 DIAGNOSIS — I2510 Atherosclerotic heart disease of native coronary artery without angina pectoris: Secondary | ICD-10-CM | POA: Diagnosis not present

## 2017-04-04 DIAGNOSIS — T45515A Adverse effect of anticoagulants, initial encounter: Secondary | ICD-10-CM | POA: Insufficient documentation

## 2017-04-04 DIAGNOSIS — R0602 Shortness of breath: Secondary | ICD-10-CM | POA: Insufficient documentation

## 2017-04-04 DIAGNOSIS — Z951 Presence of aortocoronary bypass graft: Secondary | ICD-10-CM | POA: Insufficient documentation

## 2017-04-04 DIAGNOSIS — K219 Gastro-esophageal reflux disease without esophagitis: Secondary | ICD-10-CM | POA: Diagnosis not present

## 2017-04-04 DIAGNOSIS — Z9889 Other specified postprocedural states: Secondary | ICD-10-CM | POA: Insufficient documentation

## 2017-04-04 DIAGNOSIS — Z880 Allergy status to penicillin: Secondary | ICD-10-CM | POA: Insufficient documentation

## 2017-04-04 DIAGNOSIS — I447 Left bundle-branch block, unspecified: Secondary | ICD-10-CM | POA: Diagnosis not present

## 2017-04-04 DIAGNOSIS — Z823 Family history of stroke: Secondary | ICD-10-CM | POA: Insufficient documentation

## 2017-04-04 DIAGNOSIS — I5022 Chronic systolic (congestive) heart failure: Secondary | ICD-10-CM

## 2017-04-04 DIAGNOSIS — I480 Paroxysmal atrial fibrillation: Secondary | ICD-10-CM | POA: Diagnosis not present

## 2017-04-04 DIAGNOSIS — Z95810 Presence of automatic (implantable) cardiac defibrillator: Secondary | ICD-10-CM | POA: Insufficient documentation

## 2017-04-04 DIAGNOSIS — I272 Pulmonary hypertension, unspecified: Secondary | ICD-10-CM | POA: Diagnosis not present

## 2017-04-04 DIAGNOSIS — Z7901 Long term (current) use of anticoagulants: Secondary | ICD-10-CM | POA: Diagnosis not present

## 2017-04-04 DIAGNOSIS — Z79899 Other long term (current) drug therapy: Secondary | ICD-10-CM | POA: Insufficient documentation

## 2017-04-04 DIAGNOSIS — E785 Hyperlipidemia, unspecified: Secondary | ICD-10-CM | POA: Insufficient documentation

## 2017-04-04 DIAGNOSIS — Z8249 Family history of ischemic heart disease and other diseases of the circulatory system: Secondary | ICD-10-CM | POA: Insufficient documentation

## 2017-04-04 DIAGNOSIS — R21 Rash and other nonspecific skin eruption: Secondary | ICD-10-CM | POA: Insufficient documentation

## 2017-04-04 DIAGNOSIS — I080 Rheumatic disorders of both mitral and aortic valves: Secondary | ICD-10-CM | POA: Diagnosis not present

## 2017-04-04 DIAGNOSIS — R002 Palpitations: Secondary | ICD-10-CM | POA: Insufficient documentation

## 2017-04-04 DIAGNOSIS — I252 Old myocardial infarction: Secondary | ICD-10-CM | POA: Insufficient documentation

## 2017-04-04 DIAGNOSIS — I255 Ischemic cardiomyopathy: Secondary | ICD-10-CM | POA: Insufficient documentation

## 2017-04-04 NOTE — Patient Instructions (Signed)
Your physician recommends that you schedule a follow-up appointment in 3 MONTHS. 

## 2017-04-04 NOTE — Addendum Note (Signed)
Encounter addended by: Scarlette Calico, RN on: 04/04/2017 11:34 AM  Actions taken: Sign clinical note

## 2017-04-04 NOTE — Progress Notes (Signed)
  Echocardiogram 2D Echocardiogram has been performed.  [name] 04/04/2017, 10:44 AM

## 2017-04-04 NOTE — Progress Notes (Signed)
Patient ID: [name], male   DOB: January 01, 1941, 77 y.o.   MRN: [id number]    Advanced Heart Failure Clinic Note   Date:  04/04/2017   ID:  [name], [name] [birth year], MRN [id number]  PCP:  [name], MD  Primary Cardiologist:  [name] Referring: Dr. [name]   History of Present Illness: [name] is a 77 y.o. 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 In September 1998 he underwent CABG revascularization surgery after an unsuccessful attempt at stenting of his proximal LAD by [name]. Ejection fraction was 25-30%. In 2002 he underwent initial ICD implantation for nonsustained ventricular tachycardia documented on event monitor for primary prevention. In January 2010 he underwent generator change with a Medtronic Virtuoso single chamber cardioverter defibrillator.  A nuclear perfusion study in November 2013 showed a large area of scar in the LAD territory (extent 44%) involving the mid to apical anterior,apical and infero-apical to mid infero-septal and apical lateral wall without associated ischemia.  He is on Coumadin anticoagulation for PAF. He was readmitted to the hospital from a May 2 through 06/04/2014 with recurrent atrial fibrillation and started on Tikosyn. He was enrolled in the genetic AF trial (bucindolol vs Toprol). He did poorly in the trial in the setting of titration of beta-blocker. He was referred to HF Clinic. Genetic AF study completed for pt. He is now on bisoprol 2.5 mg daily.   He has been discussing CRT upgrade with Dr. 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    He presents today for HF follow up. Overall feeling great. Walking 5000 steps/day. Stopped entresto after getting an itchy rash and went back to losartan. No dizziness. Rare palpitations. SOB only when moving  quickly. No orthopnea or PND. Weights at home 141-142 lbs. BPs at home 120's. Compliant with medications.   Echo 04/04/17 reviewed by Dr [name]. EF 20% with mild to mod reduction RV function.   CPX 02/17/16 Resting HR: 73 Peak HR: 108  (75% age predicted max HR) BP rest: 110/58 BP peak: 150/56 Peak VO2: 14.4 (58% predicted peak VO2) VE/VCO2 slope: 33 OUES: 1.32 Peak RER: 1.10 PETCO2 at peak: 33 O2pulse: 9  (82% predicted O2pulse)  Echo 12/17 EF 15-20%, Moderate MR, RV with reduced function Echo 5/17 EF 20% RV mildly down.  Echo 3/19: EF 20%, RV mild to moderate reduced function  Optivol interrogated: Fluid below threshold. One episode of afib that lasted a few minutes. No VF/VT.  Labs 10/04/15 K 5.3, Creatinine 0.85, BUN 15  Past Medical History:  Diagnosis Date  . Acute on chronic systolic CHF (congestive heart failure) ([place]) 06/09/2015  . 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 Cardiomyopathy, ischemic 2011   with EF 25-35% by echo  . Coronary artery disease    Hx MI 1992, CABG 1998 , Nuc study 11.2013 large scar but no ischemia  . GERD (gastroesophageal reflux disease)   . H. pylori infection    Hx of   . 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 NSVT (nonsustained ventricular tachycardia) ([place])   . Paroxysmal atrial fibrillation ([facility])     Current Outpatient Medications  Medication Sig Dispense Refill  . acetaminophen (TYLENOL) 650 MG CR tablet Take 650 mg by  mouth 2 (two) times daily.     Marland Kitchen allopurinol (ZYLOPRIM) 300 MG tablet Take 300 mg by mouth daily.    . bisoprolol (ZEBETA) 5 MG tablet Take 0.5 tablets (2.5 mg total) daily by mouth. 45 tablet 3  . cetirizine (ZYRTEC) 10 MG tablet Take 10 mg by mouth daily.      . Cholecalciferol (VITAMIN D) 2000 UNITS CAPS Take 2,000 Units by mouth daily at 12 noon.     . colchicine 0.6 MG tablet Take 0.6 mg by mouth daily as needed.    .  dicyclomine (BENTYL) 10 MG capsule Take 1 capsule (10 mg total) by mouth every 8 (eight) hours as needed for spasms. 90 capsule 1  . isosorbide mononitrate (IMDUR) 30 MG 24 hr tablet Take 15 mg by mouth daily.    Marland Kitchen LORazepam (ATIVAN) 1 MG tablet Take 0.25-0.5 mg by mouth as needed.     Marland Kitchen losartan (COZAAR) 25 MG tablet Take 1 tablet (25 mg total) by mouth at bedtime. 30 tablet 3  . magnesium oxide (MAG-OX) 400 MG tablet Take 400 mg by mouth daily at 12 noon.     . metolazone (ZAROXOLYN) 2.5 MG tablet Take 1 tablet (2.5 mg total) by mouth daily as needed (if weight 135 lbs or more). Take 40 meq potassium if you take this pill. 2 tablet 0  . mometasone (ASMANEX) 220 MCG/INH inhaler Inhale 1 puff into the lungs daily.     . montelukast (SINGULAIR) 10 MG tablet Take 1 tablet (10 mg total) by mouth at bedtime. PLEASE SCHEDULE APPOINTMENT. 30 tablet 0  . nitroGLYCERIN (NITROSTAT) 0.4 MG SL tablet Place 1 tablet (0.4 mg total) under the tongue every 5 (five) minutes as needed for chest pain. 25 tablet 1  . omeprazole (PRILOSEC) 20 MG capsule Take 20 mg by mouth every morning.    Vladimir Faster Glycol-Propyl Glycol (SYSTANE OP) Apply 2-3 drops to eye 3 (three) times daily as needed (dry eyes).    . potassium chloride (K-DUR,KLOR-CON) 10 MEQ tablet Take 5 mEq by mouth daily.    . silodosin (RAPAFLO) 8 MG CAPS capsule Take 8 mg by mouth every other day.    . simvastatin (ZOCOR) 40 MG tablet TAKE ONE TABLET BY MOUTH AT BEDTIME 90 tablet 2  . spironolactone (ALDACTONE) 25 MG tablet TAKE ONE TABLET BY MOUTH ONCE DAILY 90 tablet 3  . torsemide (DEMADEX) 20 MG tablet Take 1 tablet (20 mg total) by mouth daily. 90 tablet 3  . warfarin (COUMADIN) 5 MG tablet Take 1/2 to 1 tablet by mouth daily as directed by coumadin clinic 90 tablet 1   No current facility-administered medications for this encounter.     Allergies:    Allergies  Allergen Reactions  . Amoxicillin Rash    Has patient had a PCN reaction causing  immediate rash, facial/tongue/throat swelling, SOB or lightheadedness with hypotension: Yes Has patient had a PCN reaction causing severe rash involving mucus membranes or skin necrosis: No Has patient had a PCN reaction that required hospitalization: No Has patient had a PCN reaction occurring within the last 10 years: No If all of the above answers are "NO", then may proceed with Cephalosporin use.   Marland Kitchen Penicillins Rash    Has patient had a PCN reaction causing immediate rash, facial/tongue/throat swelling, SOB or lightheadedness with hypotension: Yes Has patient had a PCN reaction causing severe rash involving mucus membranes or skin necrosis: No Has patient had a PCN reaction that required hospitalization: No  Has patient had a PCN reaction occurring within the last 10 years: No If all of the above answers are "NO", then may proceed with Cephalosporin use.     Social History:  The patient  reports that  has never smoked. he has never used smokeless tobacco. He reports that he drinks about 1.2 oz of alcohol per week. He reports that he does not use drugs.   Family history:   Family History  Problem Relation Age of Onset  . Heart disease Father        questionable  . Stroke Mother   . Heart failure Sister   . Healthy Brother   . Healthy Sister   . Parkinsonism Brother   . Colon cancer Neg Hx    ROS:  Please see history of present illness. All other systems reviewed and negative.    PHYSICAL EXAM: VS:  BP 130/70   Pulse 80   Wt 146 lb (66.2 kg)   SpO2 97%   BMI 22.87 kg/m    Wt Readings from Last 3 Encounters:  04/04/17 146 lb (66.2 kg)  01/02/17 145 lb (65.8 kg)  12/13/16 144 lb 3.2 oz (65.4 kg)   General:  Well appearing. No resp difficulty HEENT: normal anicteric Neck: supple. JVP ~8. Carotids 2+ bilat; no bruits. No lymphadenopathy or thryomegaly appreciated. Cor: PMI laterally displaced. Regular rate & rhythm. No rubs, gallops or murmurs. No s3 Lungs: clear no  wheeze  Abdomen: soft, nontender, nondistended. No hepatosplenomegaly. No bruits or masses. Good bowel sounds. Extremities: no cyanosis, clubbing, rash, edema Neuro: alert & orientedx3, cranial nerves grossly intact. moves all 4 extremities w/o difficulty. Affect pleasant   ASSESSMENT AND PLAN:  1. Chronic systolic HF due to iCM, Echo 12/2015 EF 20, Echo 3/19 EF 20% - CPX from 1/18  shows moderate HF limitation - Improved with CRT. Now NYHA II.  - Volume status stable on optivol and exam.  - Rash after taking Entresto. Back on losartan 25 mg daily. - Continue torsemide at 20 daily  - Continue spiro 25 mg daily - Continue bisoprolol 2.5 mg daily 2. PAF - Regular on exam and optivol. Back on warfarin now (was on Eliquis before hematoma). Wants to stay on warfarin for cost sake.   3. CAD s/p previous anterior infarct - No s/s ischemia. Off ASA due to warfarin. Continue statin. 4. LBBB - now s/p CRT. No change 5. ICD hematoma - Resolved  Georgiana Shore, NP 10:50 AM   Patient seen and examined with the above-signed Advanced Practice Provider and/or Housestaff. I personally reviewed laboratory data, imaging studies and relevant notes. I independently examined the patient and formulated the important aspects of the plan. I have edited the note to reflect any of my changes or salient points. I have personally discussed the plan with the patient and/or family.  Doing well. NYHA II Echo today EF stable at 20%. Mild to moderate RV dysfunction. Volume status looks good. Watch closely with steroid taper for gout. Continue current regimen. ICD interrogated personally. Stable parameters as above.  Glori Bickers, MD  11:26 AM

## 2017-04-11 DIAGNOSIS — H353122 Nonexudative age-related macular degeneration, left eye, intermediate dry stage: Secondary | ICD-10-CM | POA: Diagnosis not present

## 2017-04-11 DIAGNOSIS — H353211 Exudative age-related macular degeneration, right eye, with active choroidal neovascularization: Secondary | ICD-10-CM | POA: Diagnosis not present

## 2017-04-17 DIAGNOSIS — I480 Paroxysmal atrial fibrillation: Secondary | ICD-10-CM | POA: Diagnosis not present

## 2017-04-17 DIAGNOSIS — I2510 Atherosclerotic heart disease of native coronary artery without angina pectoris: Secondary | ICD-10-CM | POA: Diagnosis not present

## 2017-04-17 DIAGNOSIS — K219 Gastro-esophageal reflux disease without esophagitis: Secondary | ICD-10-CM | POA: Diagnosis not present

## 2017-04-17 DIAGNOSIS — F5101 Primary insomnia: Secondary | ICD-10-CM | POA: Diagnosis not present

## 2017-04-17 DIAGNOSIS — E782 Mixed hyperlipidemia: Secondary | ICD-10-CM | POA: Diagnosis not present

## 2017-04-17 DIAGNOSIS — J309 Allergic rhinitis, unspecified: Secondary | ICD-10-CM | POA: Diagnosis not present

## 2017-04-17 DIAGNOSIS — I10 Essential (primary) hypertension: Secondary | ICD-10-CM | POA: Diagnosis not present

## 2017-04-17 DIAGNOSIS — Z95810 Presence of automatic (implantable) cardiac defibrillator: Secondary | ICD-10-CM | POA: Diagnosis not present

## 2017-04-17 DIAGNOSIS — I5022 Chronic systolic (congestive) heart failure: Secondary | ICD-10-CM | POA: Diagnosis not present

## 2017-04-17 DIAGNOSIS — F419 Anxiety disorder, unspecified: Secondary | ICD-10-CM | POA: Diagnosis not present

## 2017-04-17 DIAGNOSIS — E559 Vitamin D deficiency, unspecified: Secondary | ICD-10-CM | POA: Diagnosis not present

## 2017-04-18 ENCOUNTER — Telehealth: Payer: Self-pay

## 2017-04-18 ENCOUNTER — Ambulatory Visit (INDEPENDENT_AMBULATORY_CARE_PROVIDER_SITE_OTHER): Payer: Medicare Other

## 2017-04-18 DIAGNOSIS — I5022 Chronic systolic (congestive) heart failure: Secondary | ICD-10-CM | POA: Diagnosis not present

## 2017-04-18 DIAGNOSIS — Z95810 Presence of automatic (implantable) cardiac defibrillator: Secondary | ICD-10-CM | POA: Diagnosis not present

## 2017-04-18 NOTE — Progress Notes (Signed)
Patient returned call and stated he is feeling fine.  Weight is stable at 141 lbs. Advised to call if he has any fluid symptoms.  No changes today.

## 2017-04-18 NOTE — Telephone Encounter (Signed)
Remote ICM transmission received.  Attempted call to patient and left message to return call. 

## 2017-04-18 NOTE — Progress Notes (Signed)
EPIC Encounter for ICM Monitoring  Patient Name: [name] is a 77 y.o. male Date: 04/18/2017 Primary Care Physican: [name], MD Primary Cardiologist:Kelly/Bensimhon Electrophysiologist: [name] Dry Weight:Previous weight 140.5lbs BiV Pacing: 97.7%          Attempted call to patient and unable to reach.  Left message to return call.  Transmission reviewed.    Thoracic impedance normal.  Prescribed dosage: Torsemide 20 mg 1 tabletdaily  Labs: 01/01/2017 Creatinine0.79, BUN13, Potassium4.5, Sodium141, EGFR87-100 09/22/2016 Creatinine0.84, BUN13, Potassium3.8, Sodium135, EGFR>60  09/20/2016 Creatinine0.90, BUN19, Potassium4.1, [id number], [id number]  08/06/2018Creatinine 0.98, BUN12, Potassium3.4, Sodium137, [id number]  05/15/2018Creatinine 0.86, BUN14, Potassium3.9, Sodium140  02/19/2018Creatinine 1.13, BUN16, Potassium3.4, Sodium135, EGFR>60 A complete set of results can be found in Results Review   Recommendations: NONE - Unable to reach.  Follow-up plan: ICM clinic phone appointment on 05/20/2017.    Copy of ICM check sent to Dr. [name].   3 month ICM trend: 04/18/2017    1 Year ICM trend:       [name], RN 04/18/2017 11:47 AM

## 2017-04-19 DIAGNOSIS — Z961 Presence of intraocular lens: Secondary | ICD-10-CM | POA: Diagnosis not present

## 2017-04-19 DIAGNOSIS — H353211 Exudative age-related macular degeneration, right eye, with active choroidal neovascularization: Secondary | ICD-10-CM | POA: Diagnosis not present

## 2017-04-19 DIAGNOSIS — H4720 Unspecified optic atrophy: Secondary | ICD-10-CM | POA: Diagnosis not present

## 2017-04-26 ENCOUNTER — Ambulatory Visit (INDEPENDENT_AMBULATORY_CARE_PROVIDER_SITE_OTHER): Payer: Medicare Other | Admitting: Pharmacist

## 2017-04-26 DIAGNOSIS — I4891 Unspecified atrial fibrillation: Secondary | ICD-10-CM | POA: Diagnosis not present

## 2017-04-26 DIAGNOSIS — Z5181 Encounter for therapeutic drug level monitoring: Secondary | ICD-10-CM

## 2017-04-26 DIAGNOSIS — Z7901 Long term (current) use of anticoagulants: Secondary | ICD-10-CM | POA: Diagnosis not present

## 2017-04-26 DIAGNOSIS — I480 Paroxysmal atrial fibrillation: Secondary | ICD-10-CM | POA: Diagnosis not present

## 2017-04-26 LAB — POCT INR: INR: 3.3

## 2017-04-26 NOTE — Patient Instructions (Addendum)
Don't take any warfarin today 04/26/17 then continue with 1 tablet daily except 1/2 tablet each Monday and Friday. Recheck INR in 2 weeks.

## 2017-04-27 ENCOUNTER — Telehealth: Payer: Self-pay | Admitting: Physician Assistant

## 2017-04-27 NOTE — Telephone Encounter (Signed)
1009: Spoke with patient who describes some epigastric abdominal discomfort this morning, did take a dicyclomine 10 mg about 10 minutes ago which has not helped yet.  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  Did have a bowel movement with a scant amount of bright red blood in it.  Describes a history of the same with hemorrhoids.  Did apply Preparation H.  has seen no further bleeding since.  No worsening of his abdominal pain, shortness of breath, dizziness, fever or chills.  Discussed with patient that he should monitor his symptoms, if he has further episodes of bleeding or begins with any of the symptoms listed above or worsening of his abdominal pain he was told to go to the ER.  Otherwise I will have our nursing staff call him on Monday and make an appointment with an extender next week to be seen in regards to his recent rectal bleeding and stomach discomfort. Was told he could take up to 20mg  Dicyclomine every 6 hours.   Earnie Larsson, PA-C [id number] 04/27/17

## 2017-04-27 NOTE — Telephone Encounter (Signed)
error 

## 2017-04-29 ENCOUNTER — Telehealth: Payer: Self-pay

## 2017-04-29 NOTE — Telephone Encounter (Signed)
The pt has been advised of the appt with Dr Havery Moros and he states the bleeding has subsided.  He will call if the bleeding worsens prior to the appt.

## 2017-04-29 NOTE — Telephone Encounter (Signed)
-----   Message from Levin Erp, [place] sent at 04/27/2017 10:11 AM EDT ----- Regarding: Abdominal pain, Rectal bleed Called this weekend, see phone note. Please call on Monday to check on patient and make him appt with any extender or [name] for abd pain and rectal bleeding.   Thanks-JLL

## 2017-05-01 ENCOUNTER — Other Ambulatory Visit: Payer: Self-pay | Admitting: Gastroenterology

## 2017-05-10 ENCOUNTER — Ambulatory Visit (INDEPENDENT_AMBULATORY_CARE_PROVIDER_SITE_OTHER): Payer: Medicare Other | Admitting: Pharmacist

## 2017-05-10 DIAGNOSIS — I4891 Unspecified atrial fibrillation: Secondary | ICD-10-CM | POA: Diagnosis not present

## 2017-05-10 DIAGNOSIS — Z7901 Long term (current) use of anticoagulants: Secondary | ICD-10-CM | POA: Diagnosis not present

## 2017-05-10 DIAGNOSIS — I480 Paroxysmal atrial fibrillation: Secondary | ICD-10-CM | POA: Diagnosis not present

## 2017-05-10 DIAGNOSIS — Z5181 Encounter for therapeutic drug level monitoring: Secondary | ICD-10-CM | POA: Diagnosis not present

## 2017-05-10 LAB — POCT INR: INR: 2.8

## 2017-05-15 DIAGNOSIS — Z79899 Other long term (current) drug therapy: Secondary | ICD-10-CM | POA: Diagnosis not present

## 2017-05-15 DIAGNOSIS — I509 Heart failure, unspecified: Secondary | ICD-10-CM | POA: Diagnosis not present

## 2017-05-15 DIAGNOSIS — M109 Gout, unspecified: Secondary | ICD-10-CM | POA: Diagnosis not present

## 2017-05-15 DIAGNOSIS — M25561 Pain in right knee: Secondary | ICD-10-CM | POA: Diagnosis not present

## 2017-05-15 DIAGNOSIS — M1990 Unspecified osteoarthritis, unspecified site: Secondary | ICD-10-CM | POA: Diagnosis not present

## 2017-05-20 ENCOUNTER — Ambulatory Visit (INDEPENDENT_AMBULATORY_CARE_PROVIDER_SITE_OTHER): Payer: Medicare Other

## 2017-05-20 DIAGNOSIS — I5022 Chronic systolic (congestive) heart failure: Secondary | ICD-10-CM | POA: Diagnosis not present

## 2017-05-20 DIAGNOSIS — Z95810 Presence of automatic (implantable) cardiac defibrillator: Secondary | ICD-10-CM | POA: Diagnosis not present

## 2017-05-20 NOTE — Progress Notes (Signed)
EPIC Encounter for ICM Monitoring  Patient Name: [name] is a 77 y.o. male Date: 05/20/2017 Primary Care Physican: [name], MD Primary Cardiologist:Kelly/Bensimhon Electrophysiologist: [name] Dry Weight:Previous weight 141lbs BiV Pacing: 96.1%      Heart Failure questions reviewed, pt asymptomatic.   Thoracic impedance normal.  Prescribed dosage: Torsemide 20 mg 1 tabletdaily  Labs: 01/01/2017 Creatinine0.79, BUN13, Potassium4.5, Sodium141, EGFR87-100 09/22/2016 Creatinine0.84, BUN13, Potassium3.8, Sodium135, EGFR>60  09/20/2016 Creatinine0.90, BUN19, Potassium4.1, Sodium138, [id number]  08/06/2018Creatinine 0.98, BUN12, Potassium3.4, Sodium137, [id number]  05/15/2018Creatinine 0.86, BUN14, Potassium3.9, Sodium140  02/19/2018Creatinine 1.13, BUN16, Potassium3.4, Sodium135, EGFR>60 A complete set of results can be found in Results Review  Recommendations: No changes.  Encouraged to call for fluid symptoms.  Follow-up plan: ICM clinic phone appointment on 06/20/2017.    Copy of ICM check sent to Dr. [name].   3 month ICM trend: 05/20/2017    1 Year ICM trend:       [name], RN 05/20/2017 4:31 PM

## 2017-05-27 ENCOUNTER — Telehealth: Payer: Self-pay | Admitting: Gastroenterology

## 2017-05-27 NOTE — Telephone Encounter (Signed)
Spoke to patient, let him know that Metamucil or Citracel would not be the best choices as they can cause more bloating. Suggested 1/2 dose Miralax if he felt he needed that to have a complete bm. Patient will discuss this more when he comes in for his appointment in May. He also noted that rectal bleeding resolved, he felt it was related to hemorrhoids.

## 2017-05-27 NOTE — Telephone Encounter (Signed)
Pt wants to know if he could take metamucil for his IBS.

## 2017-05-28 DIAGNOSIS — J3089 Other allergic rhinitis: Secondary | ICD-10-CM | POA: Diagnosis not present

## 2017-05-28 DIAGNOSIS — J3081 Allergic rhinitis due to animal (cat) (dog) hair and dander: Secondary | ICD-10-CM | POA: Diagnosis not present

## 2017-05-28 DIAGNOSIS — J4540 Moderate persistent asthma, uncomplicated: Secondary | ICD-10-CM | POA: Diagnosis not present

## 2017-05-28 DIAGNOSIS — J301 Allergic rhinitis due to pollen: Secondary | ICD-10-CM | POA: Diagnosis not present

## 2017-06-03 ENCOUNTER — Telehealth: Payer: Self-pay | Admitting: Gastroenterology

## 2017-06-03 NOTE — Telephone Encounter (Signed)
Spoke to patient, he is having intermittent abdominal pain, does have some relief with dicyclomine. No other symptoms, no rectal bleeding. I suggested if he was in that much pain, since there are no available appointments, to contact his PCP or ED if increased pain or symptoms. Patient is scheduled to be seen on 06/11/17. Patient states he will wait until his appointment next week.

## 2017-06-03 NOTE — Telephone Encounter (Signed)
I agree with your plan. He can increase Bentyl to 20mg  q 8 hrs PRN if that helps until his visit with [name]. Thanks

## 2017-06-03 NOTE — Telephone Encounter (Signed)
Message sent to patient through MyChart about increasing Bentyl.

## 2017-06-06 ENCOUNTER — Ambulatory Visit (INDEPENDENT_AMBULATORY_CARE_PROVIDER_SITE_OTHER): Payer: Medicare Other | Admitting: Pharmacist Clinician (PhC)/ Clinical Pharmacy Specialist

## 2017-06-06 DIAGNOSIS — H35721 Serous detachment of retinal pigment epithelium, right eye: Secondary | ICD-10-CM | POA: Diagnosis not present

## 2017-06-06 DIAGNOSIS — H353211 Exudative age-related macular degeneration, right eye, with active choroidal neovascularization: Secondary | ICD-10-CM | POA: Diagnosis not present

## 2017-06-06 DIAGNOSIS — I480 Paroxysmal atrial fibrillation: Secondary | ICD-10-CM | POA: Diagnosis not present

## 2017-06-06 DIAGNOSIS — I4891 Unspecified atrial fibrillation: Secondary | ICD-10-CM | POA: Diagnosis not present

## 2017-06-06 DIAGNOSIS — H353132 Nonexudative age-related macular degeneration, bilateral, intermediate dry stage: Secondary | ICD-10-CM | POA: Diagnosis not present

## 2017-06-06 DIAGNOSIS — Z5181 Encounter for therapeutic drug level monitoring: Secondary | ICD-10-CM | POA: Diagnosis not present

## 2017-06-06 DIAGNOSIS — H35363 Drusen (degenerative) of macula, bilateral: Secondary | ICD-10-CM | POA: Diagnosis not present

## 2017-06-06 DIAGNOSIS — Z7901 Long term (current) use of anticoagulants: Secondary | ICD-10-CM

## 2017-06-06 LAB — POCT INR: INR: 3.1

## 2017-06-06 NOTE — Patient Instructions (Signed)
Description   Continue with 1 tablet daily except 1/2 tablet each Monday and Friday. Recheck INR in 4 weeks.

## 2017-06-07 DIAGNOSIS — R972 Elevated prostate specific antigen [PSA]: Secondary | ICD-10-CM | POA: Diagnosis not present

## 2017-06-11 ENCOUNTER — Encounter: Payer: Self-pay | Admitting: Gastroenterology

## 2017-06-11 ENCOUNTER — Ambulatory Visit (INDEPENDENT_AMBULATORY_CARE_PROVIDER_SITE_OTHER): Payer: Medicare Other | Admitting: Gastroenterology

## 2017-06-11 VITALS — BP 100/50 | HR 72 | Ht 65.5 in | Wt 146.1 lb

## 2017-06-11 DIAGNOSIS — K648 Other hemorrhoids: Secondary | ICD-10-CM | POA: Diagnosis not present

## 2017-06-11 DIAGNOSIS — K589 Irritable bowel syndrome without diarrhea: Secondary | ICD-10-CM | POA: Diagnosis not present

## 2017-06-11 DIAGNOSIS — R3914 Feeling of incomplete bladder emptying: Secondary | ICD-10-CM | POA: Diagnosis not present

## 2017-06-11 DIAGNOSIS — R972 Elevated prostate specific antigen [PSA]: Secondary | ICD-10-CM | POA: Diagnosis not present

## 2017-06-11 DIAGNOSIS — N5201 Erectile dysfunction due to arterial insufficiency: Secondary | ICD-10-CM | POA: Diagnosis not present

## 2017-06-11 DIAGNOSIS — I255 Ischemic cardiomyopathy: Secondary | ICD-10-CM

## 2017-06-11 MED ORDER — AMBULATORY NON FORMULARY MEDICATION: Status: DC

## 2017-06-11 NOTE — Progress Notes (Signed)
HPI :  77 year old male has been followed here previously for irritable bowel syndrome, GERD and colon cancer screening.  He has a history of coronary artery disease and history of NSVT as well as heart failure with an ejection fraction of 20%. He's never had a prior colonoscopy but has had screening with colo-guard in both 2015 and 2018 which were both negative. He has no family history of colon cancer. He has been avoiding elective endoscopic evaluations given his cardiac history.  Over the past 3 weeks he developed some spasms in his mid abdomen. He states this bothers him for about 2 weeks and then resolved on its own. He states this feels the same as it has when it first started years ago, thought to be related with his IBS. He states it generally comes and goes, he may have felt that daily for 2 weeks or so. No nocturnal symptoms. A bowel movement usually relieves it. He is also had some associated bloating with this. Located in the mid abdomen. He was taking some dicyclomine which seemed to help it. He has been eating a lot of fruits lately and wonders if this could be related to some of the symptoms. He does feel like she has some hemorrhoids which bother him at times. He has had some scant blood noted on the toilet paper only which occurred a few times and had not recurred. This occurred roughly 3-4 weeks ago.  Is very active in general, walks 08-8998 steps day. Otherwise feeling pretty well. He continues to take omeprazole for reflux.  Echo 04/04/2017 - EF 20-25%    Past Medical History:  Diagnosis Date  . Acute on chronic systolic CHF (congestive heart failure) ([place]) 06/09/2015  . 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 Cardiomyopathy, ischemic 2011   with EF 25-35% by echo  . Coronary artery disease    Hx MI 1992, CABG 1998 , Nuc study 11.2013 large scar but no ischemia  . GERD (gastroesophageal reflux disease)   . H. pylori infection      Hx of   . 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 NSVT (nonsustained ventricular tachycardia) ([place])   . Paroxysmal atrial fibrillation [facility])      Past Surgical History:  Procedure Laterality Date  . BIV UPGRADE N/A 09/10/2016   Procedure: BiV ICD Upgrade;  Surgeon: Deboraha Sprang, MD;  Location: [facility];  Service: Cardiovascular;  Laterality: N/A;  . CATARACT EXTRACTION, BILATERAL    . CORONARY ARTERY BYPASS GRAFT  1998   x 5  . HERNIA REPAIR  1960's   right inguinal  . ICD GENERATOR CHANGE  01/2008   medtronic, hx+ EP study  . KNEE SURGERY     right  . NASAL SINUS SURGERY  05/31/2010   Dr. Benjamine Mola   Family History  Problem Relation Age of Onset  . Heart disease Father        questionable  . Stroke Mother   . Heart failure Sister   . Healthy Brother   . Healthy Sister   . Parkinsonism Brother   . Colon cancer Neg Hx    Social History   Tobacco Use  . Smoking status: Never Smoker  . Smokeless tobacco: Never Used  Substance Use Topics  . Alcohol use: Yes    Alcohol/week: 1.2 oz    Types: 2 Glasses of wine per week  Comment: socially  . Drug use: No   Current Outpatient Medications  Medication Sig Dispense Refill  . acetaminophen (TYLENOL) 650 MG CR tablet Take 650 mg by mouth 2 (two) times daily.     Marland Kitchen allopurinol (ZYLOPRIM) 300 MG tablet Take 300 mg by mouth daily.    . bisoprolol (ZEBETA) 5 MG tablet Take 0.5 tablets (2.5 mg total) daily by mouth. 45 tablet 3  . cetirizine (ZYRTEC) 10 MG tablet Take 10 mg by mouth daily.      . Cholecalciferol (VITAMIN D) 2000 UNITS CAPS Take 2,000 Units by mouth daily at 12 noon.     . colchicine 0.6 MG tablet Take 0.6 mg by mouth daily as needed.    . dicyclomine (BENTYL) 10 MG capsule TAKE 1 CAPSULE BY MOUTH EVERY 8 HOURS AS NEEDED FOR SPASMS 90 capsule 1  . isosorbide mononitrate (IMDUR) 30 MG 24 hr tablet Take 15 mg by mouth daily.    Marland Kitchen LORazepam (ATIVAN) 1 MG tablet Take  0.25-0.5 mg by mouth as needed.     . magnesium oxide (MAG-OX) 400 MG tablet Take 400 mg by mouth daily at 12 noon.     . metolazone (ZAROXOLYN) 2.5 MG tablet Take 1 tablet (2.5 mg total) by mouth daily as needed (if weight 135 lbs or more). Take 40 meq potassium if you take this pill. 2 tablet 0  . mometasone (ASMANEX) 220 MCG/INH inhaler Inhale 1 puff into the lungs daily.     . montelukast (SINGULAIR) 10 MG tablet Take 1 tablet (10 mg total) by mouth at bedtime. PLEASE SCHEDULE APPOINTMENT. 30 tablet 0  . omeprazole (PRILOSEC) 20 MG capsule Take 20 mg by mouth every morning.    Vladimir Faster Glycol-Propyl Glycol (SYSTANE OP) Apply 2-3 drops to eye 3 (three) times daily as needed (dry eyes).    . potassium chloride (K-DUR,KLOR-CON) 10 MEQ tablet Take 5 mEq by mouth daily.    . silodosin (RAPAFLO) 8 MG CAPS capsule Take 8 mg by mouth every other day.    . simvastatin (ZOCOR) 40 MG tablet TAKE ONE TABLET BY MOUTH AT BEDTIME 90 tablet 2  . spironolactone (ALDACTONE) 25 MG tablet TAKE ONE TABLET BY MOUTH ONCE DAILY 90 tablet 3  . torsemide (DEMADEX) 20 MG tablet Take 1 tablet (20 mg total) by mouth daily. 90 tablet 3  . warfarin (COUMADIN) 5 MG tablet Take 1/2 to 1 tablet by mouth daily as directed by coumadin clinic 90 tablet 1  . losartan (COZAAR) 25 MG tablet Take 1 tablet (25 mg total) by mouth at bedtime. 30 tablet 3  . nitroGLYCERIN (NITROSTAT) 0.4 MG SL tablet Place 1 tablet (0.4 mg total) under the tongue every 5 (five) minutes as needed for chest pain. (Patient not taking: Reported on 06/11/2017) 25 tablet 1   No current facility-administered medications for this visit.    Allergies  Allergen Reactions  . Amoxicillin Rash    Has patient had a PCN reaction causing immediate rash, facial/tongue/throat swelling, SOB or lightheadedness with hypotension: Yes Has patient had a PCN reaction causing severe rash involving mucus membranes or skin necrosis: No Has patient had a PCN reaction that  required hospitalization: No Has patient had a PCN reaction occurring within the last 10 years: No If all of the above answers are "NO", then may proceed with Cephalosporin use.   Marland Kitchen Penicillins Rash    Has patient had a PCN reaction causing immediate rash, facial/tongue/throat swelling, SOB or lightheadedness with hypotension: Yes  Has patient had a PCN reaction causing severe rash involving mucus membranes or skin necrosis: No Has patient had a PCN reaction that required hospitalization: No Has patient had a PCN reaction occurring within the last 10 years: No If all of the above answers are "NO", then may proceed with Cephalosporin use.      Review of Systems: All systems reviewed and negative except where noted in HPI.   No recent labs available.  Physical Exam: BP (!) 100/50 (BP Location: Left Arm, Patient Position: Sitting, Cuff Size: Normal)   Pulse 72   Ht 5' 5.5" (1.664 m)   Wt 146 lb 2 oz (66.3 kg)   BMI 23.95 kg/m  Constitutional: Pleasant,well-developed, male in no acute distress. HEENT: Normocephalic and atraumatic. Conjunctivae are normal. No scleral icterus. Neck supple.  Cardiovascular: Normal rate, regular rhythm.  Pulmonary/chest: Effort normal and breath sounds normal. No wheezing, rales or rhonchi. Abdominal: Soft, nondistended, nontender. There are no masses palpable. No hepatomegaly. DRE / Anoscopy - normal external exam, no mass lesions, inflamed internal hemorrhoids in all positions Extremities: no edema Lymphadenopathy: No cervical adenopathy noted. Neurological: Alert and oriented to person place and time. Skin: Skin is warm and dry. No rashes noted. Psychiatric: Normal mood and affect. Behavior is normal.   ASSESSMENT AND PLAN: 77 year old male with history as outlined above, here for reassessment of the following issues:  IBS - I suspect is likely having bowel spasm that worsen in recent weeks. He states this is the type of discomfort that has  bothered him over the past several years, just worsened recently. For the past weeks not bothering him as much. He eats a lot of fruits, this could potentially be associated with some bloating. I counseled him on a low FODMAP diet to see if this helps. He can continue Bentyl as needed at this time. We will otherwise obtain recent labs from his primary care's office to ensure stable. We will await this result. It bloating persist despite trial of dietary change we can consider probiotic. He's had a CT scan remotely in 2004 for these symptoms. If they bother him more frequently without relief despite typical measures then can consider imaging in the future, but we'll hold off on that for now. He agreed with the plan.  Internal hemorrhoids - I suspect this is the cause for his recent scant bleeding symptoms. We discussed whether or not he would want a colonoscopy and what this would entail. We discussed that despite his prior negative Cologuard testing, these stool tests are not 100% accurate. He declines a colonoscopy at this time. Recommend using hydrocortisone 1% cream, apply within the anal canal once daily for a week and then as needed to reduce inflammation from hemorrhoids. He should otherwise avoid straining with bowel movements. He is not a candidate for hemorrhoid banding unless he is able to hold anticoagulation for several days, will avoid that for now.  He agreed the plan taking contact me for reassessment if symptoms recur.  Hardin Cellar, MD [facility]

## 2017-06-11 NOTE — Patient Instructions (Addendum)
If you are age 78 or older, your body mass index should be between 23-30. Your Body mass index is 23.95 kg/m. If this is out of the aforementioned range listed, please consider follow up with your Primary Care Provider.  If you are age 108 or younger, your body mass index should be between 19-25. Your Body mass index is 23.95 kg/m. If this is out of the aformentioned range listed, please consider follow up with your Primary Care Provider.   Please purchase the following medications over the counter and take as directed: Hydrocortisone cream 1%: Apply to the rectum once a day for a week, then as needed.  We are giving you handouts today regarding IBS and Low FOD-MAP diel.  We understand you will fax [name] the labs from your Primary Care Provider. Our fax number is [phone].  In the future should you consider a probiotic we would recommend: VSL#3 or Florastor  Thank you for entrusting me with your care and for choosing [employer], Dr. Upton Cellar

## 2017-06-19 DIAGNOSIS — J4540 Moderate persistent asthma, uncomplicated: Secondary | ICD-10-CM | POA: Diagnosis not present

## 2017-06-19 DIAGNOSIS — J3081 Allergic rhinitis due to animal (cat) (dog) hair and dander: Secondary | ICD-10-CM | POA: Diagnosis not present

## 2017-06-19 DIAGNOSIS — J301 Allergic rhinitis due to pollen: Secondary | ICD-10-CM | POA: Diagnosis not present

## 2017-06-19 DIAGNOSIS — J3089 Other allergic rhinitis: Secondary | ICD-10-CM | POA: Diagnosis not present

## 2017-06-20 ENCOUNTER — Ambulatory Visit (INDEPENDENT_AMBULATORY_CARE_PROVIDER_SITE_OTHER): Payer: Medicare Other | Admitting: *Deleted

## 2017-06-20 DIAGNOSIS — Z95810 Presence of automatic (implantable) cardiac defibrillator: Secondary | ICD-10-CM | POA: Diagnosis not present

## 2017-06-20 DIAGNOSIS — I5022 Chronic systolic (congestive) heart failure: Secondary | ICD-10-CM

## 2017-06-20 DIAGNOSIS — Z8679 Personal history of other diseases of the circulatory system: Secondary | ICD-10-CM

## 2017-06-20 DIAGNOSIS — I4891 Unspecified atrial fibrillation: Secondary | ICD-10-CM

## 2017-06-20 NOTE — Progress Notes (Signed)
Remote ICD transmission.   

## 2017-06-20 NOTE — Progress Notes (Signed)
EPIC Encounter for ICM Monitoring  Patient Name: [name] is a 77 y.o. male Date: 06/20/2017 Primary Care Physican: [name], MD Primary Cardiologist:Kelly/Bensimhon Electrophysiologist: [name] Dry Weight:140-141lbs BiV Pacing: 97%       Heart Failure questions reviewed, pt asymptomatic.   Thoracic impedance normal.  Prescribed dosage: Torsemide 20 mg 1 tabletdaily  Labs: 01/01/2017 Creatinine0.79, BUN13, Potassium4.5, Sodium141, [id number] 09/22/2016 Creatinine0.84, BUN13, Potassium3.8, Sodium135, EGFR>60  09/20/2016 Creatinine0.90, BUN19, Potassium4.1, Sodium138, [id number]  08/06/2018Creatinine 0.98, BUN12, Potassium3.4, Sodium137, [id number]  05/15/2018Creatinine 0.86, BUN14, Potassium3.9, Sodium140  02/19/2018Creatinine 1.13, BUN16, Potassium3.4, Sodium135, EGFR>60 A complete set of results can be found in Results Review  Recommendations: No changes.   Encouraged to call for fluid symptoms.  Follow-up plan: ICM clinic phone appointment on 07/22/2017.  Office appointment scheduled 07/11/2017 with Dr. [name].  Copy of ICM check sent to Dr. [name].   3 month ICM trend: 06/20/2017    1 Year ICM trend:       [name], RN 06/20/2017 11:25 AM

## 2017-07-04 ENCOUNTER — Ambulatory Visit (INDEPENDENT_AMBULATORY_CARE_PROVIDER_SITE_OTHER): Payer: Medicare Other | Admitting: Pharmacist Clinician (PhC)/ Clinical Pharmacy Specialist

## 2017-07-04 DIAGNOSIS — I480 Paroxysmal atrial fibrillation: Secondary | ICD-10-CM | POA: Diagnosis not present

## 2017-07-04 DIAGNOSIS — I4891 Unspecified atrial fibrillation: Secondary | ICD-10-CM

## 2017-07-04 DIAGNOSIS — Z7901 Long term (current) use of anticoagulants: Secondary | ICD-10-CM | POA: Diagnosis not present

## 2017-07-04 DIAGNOSIS — Z5181 Encounter for therapeutic drug level monitoring: Secondary | ICD-10-CM

## 2017-07-04 LAB — POCT INR: INR: 3 (ref 2.0–3.0)

## 2017-07-04 NOTE — Patient Instructions (Signed)
Description   Continue with 1 tablet daily except 1/2 tablet each Monday and Friday. Recheck INR in 5 weeks.     

## 2017-07-09 ENCOUNTER — Other Ambulatory Visit: Payer: Self-pay | Admitting: Cardiovascular Disease

## 2017-07-09 ENCOUNTER — Other Ambulatory Visit: Payer: Self-pay | Admitting: Gastroenterology

## 2017-07-09 ENCOUNTER — Other Ambulatory Visit (HOSPITAL_COMMUNITY): Payer: Self-pay | Admitting: Internal Medicine

## 2017-07-10 DIAGNOSIS — H6123 Impacted cerumen, bilateral: Secondary | ICD-10-CM | POA: Diagnosis not present

## 2017-07-10 DIAGNOSIS — J338 Other polyp of sinus: Secondary | ICD-10-CM | POA: Diagnosis not present

## 2017-07-10 DIAGNOSIS — J310 Chronic rhinitis: Secondary | ICD-10-CM | POA: Diagnosis not present

## 2017-07-11 ENCOUNTER — Other Ambulatory Visit: Payer: Self-pay | Admitting: *Deleted

## 2017-07-11 ENCOUNTER — Ambulatory Visit (HOSPITAL_COMMUNITY)
Admission: RE | Admit: 2017-07-11 | Discharge: 2017-07-11 | Disposition: A | Discharge status: Home / Self Care | Payer: Medicare Other | Source: Ambulatory Visit | Attending: Internal Medicine | Admitting: Internal Medicine

## 2017-07-11 ENCOUNTER — Other Ambulatory Visit: Payer: Self-pay

## 2017-07-11 ENCOUNTER — Encounter (HOSPITAL_COMMUNITY): Payer: Self-pay | Admitting: Internal Medicine

## 2017-07-11 VITALS — BP 120/59 | HR 81 | Wt 146.5 lb

## 2017-07-11 DIAGNOSIS — I509 Heart failure, unspecified: Secondary | ICD-10-CM

## 2017-07-11 DIAGNOSIS — Z7901 Long term (current) use of anticoagulants: Secondary | ICD-10-CM | POA: Diagnosis not present

## 2017-07-11 DIAGNOSIS — I2510 Atherosclerotic heart disease of native coronary artery without angina pectoris: Secondary | ICD-10-CM | POA: Insufficient documentation

## 2017-07-11 DIAGNOSIS — Z79899 Other long term (current) drug therapy: Secondary | ICD-10-CM | POA: Diagnosis not present

## 2017-07-11 DIAGNOSIS — I5022 Chronic systolic (congestive) heart failure: Secondary | ICD-10-CM | POA: Diagnosis not present

## 2017-07-11 DIAGNOSIS — Z95810 Presence of automatic (implantable) cardiac defibrillator: Secondary | ICD-10-CM

## 2017-07-11 DIAGNOSIS — I252 Old myocardial infarction: Secondary | ICD-10-CM | POA: Insufficient documentation

## 2017-07-11 DIAGNOSIS — I480 Paroxysmal atrial fibrillation: Secondary | ICD-10-CM | POA: Diagnosis not present

## 2017-07-11 DIAGNOSIS — K219 Gastro-esophageal reflux disease without esophagitis: Secondary | ICD-10-CM | POA: Insufficient documentation

## 2017-07-11 DIAGNOSIS — I447 Left bundle-branch block, unspecified: Secondary | ICD-10-CM | POA: Insufficient documentation

## 2017-07-11 DIAGNOSIS — I255 Ischemic cardiomyopathy: Secondary | ICD-10-CM | POA: Diagnosis not present

## 2017-07-11 DIAGNOSIS — Z8249 Family history of ischemic heart disease and other diseases of the circulatory system: Secondary | ICD-10-CM | POA: Diagnosis not present

## 2017-07-11 DIAGNOSIS — I2583 Coronary atherosclerosis due to lipid rich plaque: Secondary | ICD-10-CM

## 2017-07-11 DIAGNOSIS — Z880 Allergy status to penicillin: Secondary | ICD-10-CM | POA: Insufficient documentation

## 2017-07-11 DIAGNOSIS — Z951 Presence of aortocoronary bypass graft: Secondary | ICD-10-CM | POA: Diagnosis not present

## 2017-07-11 LAB — BASIC METABOLIC PANEL
Anion gap: 10 (ref 5–15)
BUN: 12 mg/dL (ref 6–20)
CALCIUM: 9.5 mg/dL (ref 8.9–10.3)
CO2: 29 mmol/L (ref 22–32)
CREATININE: 0.89 mg/dL (ref 0.61–1.24)
Chloride: 100 mmol/L — ABNORMAL LOW (ref 101–111)
GFR calc Af Amer: >60 mL/min (ref >60)
Glucose, Bld: 113 mg/dL — ABNORMAL HIGH (ref 65–99)
POTASSIUM: 3.5 mmol/L (ref 3.5–5.1)
SODIUM: 139 mmol/L (ref 135–145)

## 2017-07-11 MED ORDER — LOSARTAN POTASSIUM 25 MG PO TABS: 25.0000 mg | ORAL_TABLET | Freq: Two times a day (BID) | ORAL | 3 refills | Status: DC

## 2017-07-11 NOTE — Progress Notes (Signed)
Patient ID: [name], male   DOB: 1940-12-29, 77 y.o.   MRN: [id number]    Advanced Heart Failure Clinic Note   Date:  07/11/2017   ID:  [name] 1940/03/22, MRN [id number]  PCP:  Jason Frees, MD  Primary Cardiologist:  [name] Referring: Dr. Lia [name]   History of Present Illness: DAILY CRATE is a 77 y.o. 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 In September 1998 he underwent CABG revascularization surgery after an unsuccessful attempt at stenting of his proximal LAD by [name]. Ejection fraction was 25-30%. In 2002 he underwent initial ICD implantation for nonsustained ventricular tachycardia documented on event monitor for primary prevention. In January 2010 he underwent generator change with a Medtronic Virtuoso single chamber cardioverter defibrillator.  A nuclear perfusion study in November 2013 showed a large area of scar in the LAD territory (extent 44%) involving the mid to apical anterior,apical and infero-apical to mid infero-septal and apical lateral wall without associated ischemia.  He is on Coumadin anticoagulation for PAF. He was readmitted to the hospital from a May 2 through 06/04/2014 with recurrent atrial fibrillation and started on Tikosyn. He was enrolled in the genetic AF trial (bucindolol vs Toprol). He did poorly in the trial in the setting of titration of beta-blocker. He was referred to HF Clinic. Genetic AF study completed for pt. He is now on bisoprol 2.5 mg daily.   He has been discussing CRT upgrade with Dr. 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    He presents today for follow up. Overall feeling good. Gets at least 5000 steps a day, but usually 7-8k. Denies any SOB with normal activities. Mild SOB with yard work. Denies any dizziness or lightheadedness  for "quite a while". No CP, orthopnea or PND> Weight at home stable 140-142. BPs have been stable. Taking all medications as directed.   Optivol interrogated: Did not transmit.    Echo 04/04/17 reviewed by Dr [name]. EF 20% with mild to mod reduction RV function.   CPX 02/17/16 Resting HR: 73 Peak HR: 108  (75% age predicted max HR) BP rest: 110/58 BP peak: 150/56 Peak VO2: 14.4 (58% predicted peak VO2) VE/VCO2 slope: 33 OUES: 1.32 Peak RER: 1.10 PETCO2 at peak: 33 O2pulse: 9  (82% predicted O2pulse)  Echo 12/17 EF 15-20%, Moderate MR, RV with reduced function Echo 5/17 EF 20% RV mildly down.  Echo 3/19: EF 20%, RV mild to moderate reduced function    Labs 10/04/15 K 5.3, Creatinine 0.85, BUN 15  Past Medical History:  Diagnosis Date  . Acute on chronic systolic CHF (congestive heart failure) ([place]) 06/09/2015  . 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 Cardiomyopathy, ischemic 2011   with EF 25-35% by echo  . Coronary artery disease    Hx MI 1992, CABG 1998 , Nuc study 11.2013 large scar but no ischemia  . GERD (gastroesophageal reflux disease)   . H. pylori infection    Hx of   . 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 NSVT (nonsustained ventricular tachycardia) ([place])   . Paroxysmal atrial fibrillation ([facility])     Current Outpatient Medications  Medication Sig Dispense Refill  . acetaminophen (TYLENOL) 650 MG CR tablet Take 650 mg  by mouth 2 (two) times daily.     Marland Kitchen allopurinol (ZYLOPRIM) 300 MG tablet Take 300 mg by mouth daily.    . AMBULATORY NON FORMULARY MEDICATION Medication Name: Hydrocortisone cream 1%: Apply to the rectum once a day for a week, then as needed    . bisoprolol (ZEBETA) 5 MG tablet Take 0.5 tablets (2.5 mg total) daily by mouth. 45 tablet 3  . cetirizine (ZYRTEC) 10 MG tablet Take 10 mg by mouth daily.      . Cholecalciferol (VITAMIN D) 2000 UNITS CAPS Take  2,000 Units by mouth daily at 12 noon.     . colchicine 0.6 MG tablet Take 0.6 mg by mouth daily as needed.    . dicyclomine (BENTYL) 10 MG capsule TAKE 1 CAPSULE BY MOUTH EVERY 8 HOURS AS NEEDED FOR SPASMS 90 capsule 1  . isosorbide mononitrate (IMDUR) 30 MG 24 hr tablet Take 15 mg by mouth daily.    Marland Kitchen LORazepam (ATIVAN) 1 MG tablet Take 0.25-0.5 mg by mouth as needed.     Marland Kitchen losartan (COZAAR) 25 MG tablet Take 1 tablet (25 mg total) by mouth at bedtime. 30 tablet 3  . magnesium oxide (MAG-OX) 400 MG tablet Take 400 mg by mouth daily at 12 noon.     . metolazone (ZAROXOLYN) 2.5 MG tablet Take 1 tablet (2.5 mg total) by mouth daily as needed (if weight 135 lbs or more). Take 40 meq potassium if you take this pill. 2 tablet 0  . mometasone (ASMANEX) 220 MCG/INH inhaler Inhale 1 puff into the lungs daily.     . montelukast (SINGULAIR) 10 MG tablet Take 1 tablet (10 mg total) by mouth at bedtime. PLEASE SCHEDULE APPOINTMENT. 30 tablet 0  . nitroGLYCERIN (NITROSTAT) 0.4 MG SL tablet Place 1 tablet (0.4 mg total) under the tongue every 5 (five) minutes as needed for chest pain. 25 tablet 1  . omeprazole (PRILOSEC) 20 MG capsule Take 20 mg by mouth every morning.    Marland Kitchen omeprazole (PRILOSEC) 20 MG capsule TAKE 1 CAPSULE BY MOUTH ONCE DAILY 90 capsule 3  . Polyethyl Glycol-Propyl Glycol (SYSTANE OP) Apply 2-3 drops to eye 3 (three) times daily as needed (dry eyes).    . potassium chloride (K-DUR,KLOR-CON) 10 MEQ tablet Take 5 mEq by mouth daily.    . silodosin (RAPAFLO) 8 MG CAPS capsule Take 8 mg by mouth every other day.    . simvastatin (ZOCOR) 40 MG tablet TAKE 1 TABLET BY MOUTH AT BEDTIME 90 tablet 0  . spironolactone (ALDACTONE) 25 MG tablet TAKE ONE TABLET BY MOUTH ONCE DAILY 90 tablet 3  . torsemide (DEMADEX) 20 MG tablet Take 1 tablet (20 mg total) by mouth daily. 90 tablet 3  . warfarin (COUMADIN) 5 MG tablet Take 1/2 to 1 tablet by mouth daily as directed by coumadin clinic 90 tablet 1   No  current facility-administered medications for this encounter.     Allergies:    Allergies  Allergen Reactions  . Amoxicillin Rash    Has patient had a PCN reaction causing immediate rash, facial/tongue/throat swelling, SOB or lightheadedness with hypotension: Yes Has patient had a PCN reaction causing severe rash involving mucus membranes or skin necrosis: No Has patient had a PCN reaction that required hospitalization: No Has patient had a PCN reaction occurring within the last 10 years: No If all of the above answers are "NO", then may proceed with Cephalosporin use.   Marland Kitchen Penicillins Rash    Has patient had  a PCN reaction causing immediate rash, facial/tongue/throat swelling, SOB or lightheadedness with hypotension: Yes Has patient had a PCN reaction causing severe rash involving mucus membranes or skin necrosis: No Has patient had a PCN reaction that required hospitalization: No Has patient had a PCN reaction occurring within the last 10 years: No If all of the above answers are "NO", then may proceed with Cephalosporin use.     Social History:  The patient  reports that he has never smoked. He has never used smokeless tobacco. He reports that he drinks about 1.2 oz of alcohol per week. He reports that he does not use drugs.   Family history:   Family History  Problem Relation Age of Onset  . Heart disease Father        questionable  . Stroke Mother   . Heart failure Sister   . Healthy Brother   . Healthy Sister   . Parkinsonism Brother   . Colon cancer Neg Hx    Review of systems complete and found to be negative unless listed in HPI.    PHYSICAL EXAM: Vitals:   07/11/17 1100  BP: (!) 120/59  Pulse: 81  SpO2: 99%  Weight: 146 lb 8 oz (66.5 kg)   Wt Readings from Last 3 Encounters:  07/11/17 146 lb 8 oz (66.5 kg)  06/11/17 146 lb 2 oz (66.3 kg)  04/04/17 146 lb (66.2 kg)   General: Well appearing. No resp difficulty. HEENT: Normal anicteric  Neck: Supple. JVP  5-6. Carotids 2+ bilat; no bruits. No thyromegaly or nodule noted. Cor: PMI nondisplaced. RRR, No M/G/R noted Lungs: CTAB, normal effort. No wheeze Abdomen: Soft, non-tender, non-distended, no HSM. No bruits or masses. +BS  Extremities: no cyanosis, clubbing, rash, edema Neuro: alert & oriented x 3, cranial nerves grossly intact. moves all 4 extremities w/o difficulty. Affect pleasant   ASSESSMENT AND PLAN:  1. 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  - Volume status Stable on exam.   - Rash after taking Entresto. Will try to increase losartan to 25 mg bid. - Continue torsemide at 20 daily  - Continue spiro 25 mg daily - Continue bisoprolol 2.5 mg daily 2. PAF - Regular on exam and optivol - Continue coumadin. Does not want Eliquis due to cost.  3. CAD s/p previous anterior infarct - No s/s of ischemia.    - Off ASA due to warfarin. Continue statin. - Recent LDL 76 in 1/19 4. LBBB - Now s/p CRT. No change.  5. ICD hematoma - Resolved.   BMET today. RTC 6 months.   Shirley Friar, PA-C 11:16 AM   Patient seen and examined with the above-signed Advanced Practice Provider and/or Housestaff. I personally reviewed laboratory data, imaging studies and relevant notes. I independently examined the patient and formulated the important aspects of the plan. I have edited the note to reflect any of my changes or salient points. I have personally discussed the plan with the patient and/or family.  Doing well. NYHA II despite severe LV dysfunction. Volume status looks good. Increase losartan to 25 bid.   Glori Bickers, MD  11:39 AM

## 2017-07-11 NOTE — Patient Instructions (Signed)
INCREASE Losartan to 25mg  twice daily.  Routine lab work today. Will notify you of abnormal results  Follow up with Dr.Bensimhon in 6 months.  **Please call our office at [phone] in November for a December appointment**

## 2017-07-11 NOTE — Addendum Note (Signed)
Encounter addended by: Harvie Junior, CMA on: 07/11/2017 11:47 AM  Actions taken: Visit diagnoses modified, Order list changed, Diagnosis association updated, Sign clinical note

## 2017-07-15 ENCOUNTER — Other Ambulatory Visit: Payer: Self-pay | Admitting: *Deleted

## 2017-07-15 ENCOUNTER — Other Ambulatory Visit: Payer: Self-pay | Admitting: Cardiovascular Disease

## 2017-07-15 MED ORDER — SIMVASTATIN 40 MG PO TABS: 40.0000 mg | ORAL_TABLET | Freq: Every day | ORAL | 0 refills | Status: DC

## 2017-07-15 NOTE — Telephone Encounter (Signed)
New Message    *STAT* If patient is at the pharmacy, call can be transferred to refill team.   1. Which medications need to be refilled? (please list name of each medication and dose if known) simvastatin (ZOCOR) 40 MG tablet  2. Which pharmacy/location (including street and city if local pharmacy) is medication to be sent to? [place], [place]  3. Do they need a 30 day or 90 day supply? 90 day

## 2017-07-16 MED ORDER — SIMVASTATIN 40 MG PO TABS: 40.0000 mg | ORAL_TABLET | Freq: Every day | ORAL | 1 refills | Status: DC

## 2017-07-16 NOTE — Telephone Encounter (Signed)
This is Dr. Kelly's pt. °

## 2017-07-16 NOTE — Telephone Encounter (Signed)
[name] is the primary cardiologist and [name] prescribed this medication. [name] has to refill this medication or refuse this medication. Please address thank you

## 2017-07-16 NOTE — Telephone Encounter (Signed)
Patient has most recently seen Dr. Jeffie Pollock in [place] clinic; last office visit with Dr. [name] at [address] . Please advise

## 2017-07-22 ENCOUNTER — Ambulatory Visit (INDEPENDENT_AMBULATORY_CARE_PROVIDER_SITE_OTHER): Payer: Medicare Other

## 2017-07-22 DIAGNOSIS — Z95810 Presence of automatic (implantable) cardiac defibrillator: Secondary | ICD-10-CM | POA: Diagnosis not present

## 2017-07-22 DIAGNOSIS — I5022 Chronic systolic (congestive) heart failure: Secondary | ICD-10-CM

## 2017-07-23 NOTE — Progress Notes (Signed)
EPIC Encounter for ICM Monitoring  Patient Name: [name] is a 77 y.o. male Date: 07/23/2017 Primary Care Physican: [name], MD Primary Cardiologist:Kelly/Bensimhon Electrophysiologist: [name] Dry Weight:140-141lbs BiV Pacing: 97.2%       Heart Failure questions reviewed, pt asymptomatic.  He requested a message be sent to HF clinic to call him regarding a change in Losartan dosage.  Message sent to HF clinic nurses.    Thoracic impedance trending just below baseline.  Prescribed dosage: Torsemide 20 mg 1 tabletdaily  Labs: 01/01/2017 Creatinine0.79, BUN13, Potassium4.5, Sodium141, EGFR87-100 09/22/2016 Creatinine0.84, BUN13, Potassium3.8, Sodium135, EGFR>60  09/20/2016 Creatinine0.90, BUN19, Potassium4.1, Sodium138, [id number]  08/06/2018Creatinine 0.98, BUN12, Potassium3.4, Sodium137, [id number]  05/15/2018Creatinine 0.86, BUN14, Potassium3.9, Sodium140  02/19/2018Creatinine 1.13, BUN16, Potassium3.4, Sodium135, EGFR>60 A complete set of results can be found in Results Review  Recommendations: No changes.  Encouraged to call for fluid symptoms.  Follow-up plan: ICM clinic phone appointment on 08/27/2017.     Recall appt 01/07/2018 with Dr. [name].  Copy of ICM check sent to Dr. [name].   3 month ICM trend: 07/22/2017    1 Year ICM trend:       [name], RN 07/23/2017 11:58 AM

## 2017-07-24 ENCOUNTER — Telehealth (HOSPITAL_COMMUNITY): Payer: Self-pay | Admitting: *Deleted

## 2017-07-24 NOTE — Telephone Encounter (Signed)
Agree. Decrease losartan back to previous dose

## 2017-07-24 NOTE — Telephone Encounter (Signed)
-----   Message from [name], RN sent at 07/23/2017  4:19 PM EDT ----- Regarding: Losartan Hi Ladies,  Could you call patient when you get a chance about his Losartan dosage. He said he left a message last week. I was not clear on what he was trying to do with the dosage and the explanation was confusing why he wanted it to change.    Thanks, Margarita Grizzle

## 2017-07-24 NOTE — Telephone Encounter (Signed)
Spoke w/pt about his Losartan, it was increased to 25 mg BID on 07/11/17 he states since then he has been having some dizziness off/on and he would like to decrease dose to 25 mg in AM and 12.5 mg in PM.  He states BP has been running 104-117/55-60.  Will discuss w/Dr Bensimhon and let him know.

## 2017-07-25 MED ORDER — LOSARTAN POTASSIUM 25 MG PO TABS: 25.0000 mg | ORAL_TABLET | Freq: Every day | ORAL | 3 refills | Status: DC

## 2017-07-25 NOTE — Telephone Encounter (Signed)
Called and spoke with patient.  He was decrease dose to his previous dose of 25 mg Daily.  If he continues to feel dizzy and have low BP readings he will call us back.  MAR updated.

## 2017-08-02 ENCOUNTER — Telehealth: Payer: Self-pay | Admitting: Internal Medicine

## 2017-08-02 NOTE — Telephone Encounter (Signed)
New Message   Pt c/o medication issue:  1. Name of Medication: losartan (COZAAR) 25 MG tablet  2. How are you currently taking this medication (dosage and times per day)? Take 1 tablet (25 mg total) by mouth daily  3. Are you having a reaction (difficulty breathing--STAT)? yes  4. What is your medication issue? Pt states he has been experiencing a high HR since his dosage was increased HR: 85,89,86 and 82 and it normally ranges in the 60's Please call

## 2017-08-02 NOTE — Telephone Encounter (Signed)
Follow up     STAT if HR is under 50 or over 120 (normal HR is 60-100 beats per minute)  1) What is your heart rate? 85,89,86 , now 60 to  65 varies   2) Do you have a log of your heart rate readings (document readings)? Yes      7/4 - 111/60 - hr 70    7/5  120/65 - hr 82 - in the am     7/5  After doing yard work 85 to 8.    7/5 110/53  Hr 61  3) Do you have any other symptoms? Wants to discuss with nurse

## 2017-08-02 NOTE — Telephone Encounter (Signed)
Spoke with pt today who has concerns with his HR. I told pt I thought his HR's and BP's looked good and within normal limits. I advised him if he noted sustained HR's > 100 at rest to call the office. Otherwise, his HR was normal and approprietly responding to activity (walking, yard work, [employer] research officer, government). I recommended he stay hydrated, but not fluid overloaded. Pt thanked me for call and had no additional questions.

## 2017-08-04 ENCOUNTER — Other Ambulatory Visit: Payer: Self-pay | Admitting: Cardiovascular Disease

## 2017-08-04 DIAGNOSIS — I480 Paroxysmal atrial fibrillation: Secondary | ICD-10-CM

## 2017-08-09 ENCOUNTER — Ambulatory Visit (INDEPENDENT_AMBULATORY_CARE_PROVIDER_SITE_OTHER): Payer: Medicare Other | Admitting: Pharmacist

## 2017-08-09 DIAGNOSIS — I480 Paroxysmal atrial fibrillation: Secondary | ICD-10-CM

## 2017-08-09 DIAGNOSIS — I4891 Unspecified atrial fibrillation: Secondary | ICD-10-CM

## 2017-08-09 DIAGNOSIS — Z5181 Encounter for therapeutic drug level monitoring: Secondary | ICD-10-CM

## 2017-08-09 DIAGNOSIS — Z7901 Long term (current) use of anticoagulants: Secondary | ICD-10-CM | POA: Diagnosis not present

## 2017-08-09 LAB — POCT INR: INR: 2.7 (ref 2.0–3.0)

## 2017-08-15 DIAGNOSIS — H469 Unspecified optic neuritis: Secondary | ICD-10-CM | POA: Diagnosis not present

## 2017-08-15 DIAGNOSIS — H353211 Exudative age-related macular degeneration, right eye, with active choroidal neovascularization: Secondary | ICD-10-CM | POA: Diagnosis not present

## 2017-08-15 DIAGNOSIS — H35362 Drusen (degenerative) of macula, left eye: Secondary | ICD-10-CM | POA: Diagnosis not present

## 2017-08-15 DIAGNOSIS — H43811 Vitreous degeneration, right eye: Secondary | ICD-10-CM | POA: Diagnosis not present

## 2017-08-15 DIAGNOSIS — H353122 Nonexudative age-related macular degeneration, left eye, intermediate dry stage: Secondary | ICD-10-CM | POA: Diagnosis not present

## 2017-08-15 DIAGNOSIS — Z961 Presence of intraocular lens: Secondary | ICD-10-CM | POA: Diagnosis not present

## 2017-08-27 ENCOUNTER — Telehealth: Payer: Self-pay

## 2017-08-27 ENCOUNTER — Ambulatory Visit (INDEPENDENT_AMBULATORY_CARE_PROVIDER_SITE_OTHER): Payer: Medicare Other

## 2017-08-27 DIAGNOSIS — Z95810 Presence of automatic (implantable) cardiac defibrillator: Secondary | ICD-10-CM

## 2017-08-27 DIAGNOSIS — I5022 Chronic systolic (congestive) heart failure: Secondary | ICD-10-CM

## 2017-08-27 NOTE — Progress Notes (Signed)
EPIC Encounter for ICM Monitoring  Patient Name: [name] is a 77 y.o. male Date: 08/27/2017 Primary Care Physican: [name], MD Primary Cardiologist:Kelly/Bensimhon Electrophysiologist: [name] Dry Weight:Previous weight 140-141lbs BiV Pacing: 97.2%      Attempted call to patient and unable to reach.  Left message to return call.  Transmission reviewed.    Thoracic impedance normal.  Prescribed dosage:  Torsemide 20 mg 1 tabletdaily  Labs: 07/11/2017 Creatinine 0.89, BUN 12, Potassium 3.5, Sodium 139, EGFR >60 01/01/2017 Creatinine0.79, BUN13, Potassium4.5, Sodium141, EGFR87-100 09/22/2016 Creatinine0.84, BUN13, Potassium3.8, Sodium135, EGFR>60  09/20/2016 Creatinine0.90, BUN19, Potassium4.1, IOEVOJ500, [id number]  08/06/2018Creatinine 0.98, BUN12, Potassium3.4, Sodium137, [id number]  05/15/2018Creatinine 0.86, BUN14, Potassium3.9, Sodium140  02/19/2018Creatinine 1.13, BUN16, Potassium3.4, Sodium135, EGFR>60 A complete set of results can be found in Results Review  Recommendations: NONE - Unable to reach.  Follow-up plan: ICM clinic phone appointment on 10/21/2017.   Office appointment scheduled 09/19/2017 with Dr. [name].    Copy of ICM check sent to Dr. [name].   3 month ICM trend: 08/27/2017    1 Year ICM trend:       [name], RN 08/27/2017 8:42 AM

## 2017-08-27 NOTE — Progress Notes (Signed)
Returned patient call as requested by voice mail message.  Patient stated he continues to have some lightheadedness. Advised to contact Dr Bensimhon's office if needed to provide update on dizziness/lightheadedness.  He said it is not really dizziness, just lightheadedness. He asked if that could be caused by Losartan and advised to discuss the side effects with HF clinic.  He has an appointment with Dr [name] 8/22 and will discuss it with him at that time.    No changes today.  Next ICM remote transmission 10/21/2017.

## 2017-08-27 NOTE — Telephone Encounter (Signed)
Remote ICM transmission received.  Attempted call to patient and left message, per DPR, to return call.  

## 2017-09-09 ENCOUNTER — Telehealth: Payer: Self-pay | Admitting: Gastroenterology

## 2017-09-09 NOTE — Telephone Encounter (Signed)
Spoke to patient, he states he is having "fullness" long after he eats. Scheduled to see APP at his convenience, 8/14.

## 2017-09-09 NOTE — Telephone Encounter (Signed)
Patient states he is still having IBS symptoms and is feeling pressure in his abd. Patient last seen 5.14.19. Patient wanting advice.

## 2017-09-11 ENCOUNTER — Encounter: Payer: Self-pay | Admitting: Gastroenterology

## 2017-09-11 ENCOUNTER — Ambulatory Visit (INDEPENDENT_AMBULATORY_CARE_PROVIDER_SITE_OTHER): Payer: Medicare Other | Admitting: Gastroenterology

## 2017-09-11 VITALS — BP 114/76 | HR 72 | Ht 67.0 in | Wt 143.0 lb

## 2017-09-11 DIAGNOSIS — I255 Ischemic cardiomyopathy: Secondary | ICD-10-CM | POA: Diagnosis not present

## 2017-09-11 DIAGNOSIS — R198 Other specified symptoms and signs involving the digestive system and abdomen: Secondary | ICD-10-CM

## 2017-09-11 DIAGNOSIS — R6881 Early satiety: Secondary | ICD-10-CM | POA: Diagnosis not present

## 2017-09-11 DIAGNOSIS — R140 Abdominal distension (gaseous): Secondary | ICD-10-CM | POA: Diagnosis not present

## 2017-09-11 NOTE — Progress Notes (Signed)
09/11/2017 [name] [id number] [birth year]   HISTORY OF PRESENT ILLNESS:  This is a 77 year old male who is a patient of Dr. Doyne Keel.  Has IBS and GERD.  Last seen here in 05/2017.  He has a history of coronary artery disease and history of NSVT as well as heart failure with an ejection fraction of 20%. He's never had a prior colonoscopy but has had screening with colo-guard in both 2015 and 2018 which were both negative. He has no family history of colon cancer. He has been avoiding elective endoscopic evaluations given his cardiac history.   Here today complaining of feeling of fullness/bloating in upper abdomen.  He says that becomes full quickly when eating.  Then he feels somewhat short of breath after eating like the food is pushing on his diaphragm, etc.  Has complained of similar symptoms in the past.  Is on omeprazole 20 mg daily and was given bentyl to take prn.  Thinks maybe that helps some when he takes it.  Past Medical History:  Diagnosis Date  . Acute on chronic systolic CHF (congestive heart failure) ([place]) 06/09/2015  . 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 Cardiomyopathy, ischemic 2011   with EF 25-35% by echo  . Coronary artery disease    Hx MI 1992, CABG 1998 , Nuc study 11.2013 large scar but no ischemia  . GERD (gastroesophageal reflux disease)   . H. pylori infection    Hx of   . 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 NSVT (nonsustained ventricular tachycardia) ([place])   . Paroxysmal atrial fibrillation [facility])    Past Surgical History:  Procedure Laterality Date  . BIV UPGRADE N/A 09/10/2016   Procedure: BiV ICD Upgrade;  Surgeon: Deboraha Sprang, MD;  Location: [place] [facility];  Service: Cardiovascular;  Laterality: N/A;  . CATARACT EXTRACTION, BILATERAL    . CORONARY ARTERY BYPASS GRAFT  1998   x 5  . HERNIA REPAIR  1960's   right inguinal  . ICD GENERATOR  CHANGE  01/2008, 2018   medtronic, hx+ EP study  . KNEE SURGERY     right  . NASAL SINUS SURGERY  05/31/2010   Dr. Benjamine Mola    reports that he has never smoked. He has never used smokeless tobacco. He reports that he drinks about 2.0 standard drinks of alcohol per week. He reports that he does not use drugs. family history includes Healthy in his brother and sister; Heart disease in his father; Heart failure in his sister; Parkinsonism in his brother; Stroke in his mother. Allergies  Allergen Reactions  . Amoxicillin Rash    Has patient had a PCN reaction causing immediate rash, facial/tongue/throat swelling, SOB or lightheadedness with hypotension: Yes Has patient had a PCN reaction causing severe rash involving mucus membranes or skin necrosis: No Has patient had a PCN reaction that required hospitalization: No Has patient had a PCN reaction occurring within the last 10 years: No If all of the above answers are "NO", then may proceed with Cephalosporin use.   Marland Kitchen Penicillins Rash    Has patient had a PCN reaction causing immediate rash, facial/tongue/throat swelling, SOB or lightheadedness with hypotension: Yes Has patient had a PCN reaction causing severe rash involving mucus membranes or skin necrosis: No Has patient had a PCN reaction that required hospitalization: No Has patient had a PCN reaction occurring within  the last 10 years: No If all of the above answers are "NO", then may proceed with Cephalosporin use.       Outpatient Encounter Medications as of 09/11/2017  Medication Sig  . acetaminophen (TYLENOL) 650 MG CR tablet Take 650 mg by mouth 2 (two) times daily.   Marland Kitchen allopurinol (ZYLOPRIM) 300 MG tablet Take 300 mg by mouth daily.  . AMBULATORY NON FORMULARY MEDICATION Medication Name: Hydrocortisone cream 1%: Apply to the rectum once a day for a week, then as needed  . bisoprolol (ZEBETA) 5 MG tablet Take 0.5 tablets (2.5 mg total) daily by mouth.  . cetirizine (ZYRTEC) 10 MG  tablet Take 10 mg by mouth daily.    . Cholecalciferol (VITAMIN D) 2000 UNITS CAPS Take 2,000 Units by mouth daily at 12 noon.   . colchicine 0.6 MG tablet Take 0.6 mg by mouth daily as needed.  . dicyclomine (BENTYL) 10 MG capsule TAKE 1 CAPSULE BY MOUTH EVERY 8 HOURS AS NEEDED FOR SPASMS  . isosorbide mononitrate (IMDUR) 30 MG 24 hr tablet Take 15 mg by mouth daily.  Marland Kitchen LORazepam (ATIVAN) 1 MG tablet Take 0.25-0.5 mg by mouth as needed.   Marland Kitchen losartan (COZAAR) 25 MG tablet Take 1 tablet (25 mg total) by mouth daily.  . magnesium oxide (MAG-OX) 400 MG tablet Take 400 mg by mouth daily at 12 noon.   . metolazone (ZAROXOLYN) 2.5 MG tablet Take 1 tablet (2.5 mg total) by mouth daily as needed (if weight 135 lbs or more). Take 40 meq potassium if you take this pill.  . mometasone (ASMANEX) 220 MCG/INH inhaler Inhale 1 puff into the lungs daily.   . montelukast (SINGULAIR) 10 MG tablet Take 1 tablet (10 mg total) by mouth at bedtime. PLEASE SCHEDULE APPOINTMENT.  Marland Kitchen nitroGLYCERIN (NITROSTAT) 0.4 MG SL tablet Place 1 tablet (0.4 mg total) under the tongue every 5 (five) minutes as needed for chest pain.  Marland Kitchen omeprazole (PRILOSEC) 20 MG capsule Take 20 mg by mouth every morning.  Vladimir Faster Glycol-Propyl Glycol (SYSTANE OP) Apply 2-3 drops to eye 3 (three) times daily as needed (dry eyes).  . potassium chloride (K-DUR,KLOR-CON) 10 MEQ tablet Take 5 mEq by mouth daily.  . silodosin (RAPAFLO) 8 MG CAPS capsule Take 8 mg by mouth every other day.  . simvastatin (ZOCOR) 40 MG tablet Take 1 tablet (40 mg total) by mouth at bedtime.  Marland Kitchen spironolactone (ALDACTONE) 25 MG tablet TAKE ONE TABLET BY MOUTH ONCE DAILY  . torsemide (DEMADEX) 20 MG tablet Take 1 tablet (20 mg total) by mouth daily.  Marland Kitchen warfarin (COUMADIN) 5 MG tablet TAKE 1/2 TO 1 (ONE-HALF TO ONE) TABLET BY MOUTH ONCE DAILY AS DIRECTED BY COUMADIN CLINIC  . [DISCONTINUED] omeprazole (PRILOSEC) 20 MG capsule TAKE 1 CAPSULE BY MOUTH ONCE DAILY   No  facility-administered encounter medications on file as of 09/11/2017.      REVIEW OF SYSTEMS  : All other systems reviewed and negative except where noted in the History of Present Illness.   PHYSICAL EXAM: BP 114/76   Pulse 72   Ht 5\' 7"  (1.702 m)   Wt 143 lb (64.9 kg)   BMI 22.40 kg/m  General: Well developed white male in no acute distress Head: Normocephalic and atraumatic Eyes:  Sclerae anicteric, conjunctiva pink. Ears: Normal auditory acuity. Lungs: Clear throughout to auscultation; no increased WOB. Heart: Regular rate and rhythm; no M/R/G. Abdomen: Soft, non-distended.  BS present.  Non-tender. Musculoskeletal: Symmetrical with no gross deformities  Skin: No lesions on visible extremities Extremities: No edema  Neurological: Alert oriented x 4, grossly non-focal Psychological:  Alert and cooperative. Normal mood and affect  ASSESSMENT AND PLAN: *Upper abdominal fullness/bloating/early satiety:  These have been ongoing symptoms.  He is not a great endoscopy candidate.  We discussed UGI vs CT scan.  Will start with UGI series as an alternate to EGD to evaluate for ulcer, mass, etc.  If normal and he continues to complain then CT scan will likely be the next step.  We will also give him some FDgard samples to try as well.  He is already on daily PPI and Benyl prn. *Intermittent rectal bleeding:  He said that [name] mentioned banding in the past, but the fact that he is on coumadin I think makes him a less then optimal candidate.  He can discuss further with [name] in the future if he desires, but this sounds like very minimal bleeding.   CC:  [name], MD

## 2017-09-11 NOTE — Patient Instructions (Addendum)
Normal BMI (Body Mass Index- based on height and weight) is between 23 and 30. Your BMI today is  Please consider follow up  regarding your BMI with your Primary Care Provider.  We have given you samples of the following medication to take: FDgard take 1 tab daily  You have been scheduled for an Upper GI Series and Small Bowel Follow Thru at [facility]. Your appointment is on 09/16/17 at 10am. Please arrive 15 minutes prior to your test for registration. Make certain not to have anything to eat or drink after midnight on the night before your test. If you need to reschedule, please contact radiology at [phone]. --------------------------------------------------------------------------------------------------------------- An upper GI series uses x rays to help diagnose problems of the upper GI tract, which includes the esophagus, stomach, and duodenum. The duodenum is the first part of the small intestine. An upper GI series is conducted by a radiology technologist or a radiologist-a doctor who specializes in x-ray imaging-at a hospital or outpatient center. While sitting or standing in front of an x-ray machine, the patient drinks barium liquid, which is often white and has a chalky consistency and taste. The barium liquid coats the lining of the upper GI tract and makes signs of disease show up more clearly on x rays. X-ray video, called fluoroscopy, is used to view the barium liquid moving through the esophagus, stomach, and duodenum. Additional x rays and fluoroscopy are performed while the patient lies on an x-ray table. To fully coat the upper GI tract with barium liquid, the technologist or radiologist may press on the abdomen or ask the patient to change position. Patients hold still in various positions, allowing the technologist or radiologist to take x rays of the upper GI tract at different angles. If a technologist conducts the upper GI series, a radiologist will later examine the images  to look for problems.  This test typically takes about 1 hour to complete --------------------------------------------------------------------------------------------------------------------------------------------- The Small Bowel Follow Thru examination is used to visualize the entire small bowel (intestines); specifically the connection between the small and large intestine. You will be positioned on a flat x-ray table and an image of your abdomen taken. Then the technologist will show the x-ray to the radiologist. The radiologist will instruct your technologist how much (1-2 cups) barium sulfate you will drink and when to begin taking the timed x-rays, usually 15-30 minutes after you begin drinking. Barium is a harmless substance that will highlight your small intestine by absorbing x-ray. The taste is chalky and it feels very heavy both in the cup and in your stomach.  After the first x-ray is taken and shown to the radiologist, he/she will determine when the next image is to be taken. This is repeated until the barium has reached the end of the small intestine and enters the beginning of the colon (cecum). At such time when the barium spills into the colon, you will be positioned on the x-ray table once again. The radiologist will use a fluoroscopic camera to take some detailed pictures of the connection between your small intestine and colon. The fluoroscope is an x-ray unit that works with a television/computer screen. The radiologist will apply pressure to your abdomen with his/her hand and a lead glove, a plastic paddle, or a paddle with an inflated rubber balloon on the end. This is to spread apart your loops of intestine so he/she can see all areas.   This test typically takes around 1 hour to complete.  Important Drink  plenty of water (8-10 cups/day) for a few days following the procedure to avoid constipation and blockage. The barium will make your stools white for a few  days. --------------------------------------------------------------------------------------------------------------------------------------------

## 2017-09-11 NOTE — Progress Notes (Signed)
Agree with assessment and plan as outlined. I had discussed colonoscopy in regards to his symptoms previously, which he declined. Agree that he likely has hemorrhoidal bleeding but not a good candidate for banding given coumadin use. We can consider colonoscopy if he is willing or flex sig while on coumadin for diagnostic purposes if his bleeding symptoms persist and he is agreeable to an endoscopic evaluation. Otherwise will await results of barium study.

## 2017-09-13 ENCOUNTER — Ambulatory Visit (INDEPENDENT_AMBULATORY_CARE_PROVIDER_SITE_OTHER): Payer: Medicare Other | Admitting: Pharmacist

## 2017-09-13 DIAGNOSIS — Z5181 Encounter for therapeutic drug level monitoring: Secondary | ICD-10-CM | POA: Diagnosis not present

## 2017-09-13 DIAGNOSIS — I480 Paroxysmal atrial fibrillation: Secondary | ICD-10-CM | POA: Diagnosis not present

## 2017-09-13 DIAGNOSIS — Z7901 Long term (current) use of anticoagulants: Secondary | ICD-10-CM | POA: Diagnosis not present

## 2017-09-13 DIAGNOSIS — I4891 Unspecified atrial fibrillation: Secondary | ICD-10-CM

## 2017-09-13 LAB — POCT INR: INR: 3 (ref 2.0–3.0)

## 2017-09-13 NOTE — Patient Instructions (Signed)
Description   Continue with 1 tablet daily except 1/2 tablet each Monday and Friday. Recheck INR in 5 weeks.

## 2017-09-16 ENCOUNTER — Ambulatory Visit (HOSPITAL_COMMUNITY): Payer: Medicare Other

## 2017-09-19 ENCOUNTER — Encounter: Payer: Self-pay | Admitting: Internal Medicine

## 2017-09-19 ENCOUNTER — Ambulatory Visit (INDEPENDENT_AMBULATORY_CARE_PROVIDER_SITE_OTHER): Payer: Medicare Other | Admitting: Internal Medicine

## 2017-09-19 VITALS — BP 102/64 | HR 81 | Ht 67.0 in | Wt 145.4 lb

## 2017-09-19 DIAGNOSIS — I255 Ischemic cardiomyopathy: Secondary | ICD-10-CM

## 2017-09-19 DIAGNOSIS — I509 Heart failure, unspecified: Secondary | ICD-10-CM

## 2017-09-19 DIAGNOSIS — I480 Paroxysmal atrial fibrillation: Secondary | ICD-10-CM

## 2017-09-19 DIAGNOSIS — Z95810 Presence of automatic (implantable) cardiac defibrillator: Secondary | ICD-10-CM | POA: Diagnosis not present

## 2017-09-19 DIAGNOSIS — Z7901 Long term (current) use of anticoagulants: Secondary | ICD-10-CM

## 2017-09-19 LAB — CUP PACEART INCLINIC DEVICE CHECK
Brady Statistic AP VP Percent: 89.14 %
Brady Statistic AP VS Percent: 1.56 %
Brady Statistic AS VP Percent: 9.07 %
Brady Statistic AS VS Percent: 0.23 %
Brady Statistic RA Percent Paced: 89.52 %
Brady Statistic RV Percent Paced: 0.61 %
Date Time Interrogation Session: 20190822180351
HIGH POWER IMPEDANCE MEASURED VALUE: 53 Ohm
HIGH POWER IMPEDANCE MEASURED VALUE: 83 Ohm
Implantable Lead Implant Date: 20180813
Implantable Lead Location: 753858
Implantable Lead Model: 147
Lead Channel Impedance Value: 245.538
Lead Channel Impedance Value: 245.538
Lead Channel Impedance Value: 250.943
Lead Channel Impedance Value: 250.943
Lead Channel Impedance Value: 532 Ohm
Lead Channel Impedance Value: 703 Ohm
Lead Channel Impedance Value: 703 Ohm
Lead Channel Impedance Value: 836 Ohm
Lead Channel Impedance Value: 874 Ohm
Lead Channel Impedance Value: 874 Ohm
Lead Channel Impedance Value: 893 Ohm
Lead Channel Pacing Threshold Amplitude: 1.25 V
Lead Channel Pacing Threshold Amplitude: 1.25 V
Lead Channel Pacing Threshold Pulse Width: 0.4 ms
Lead Channel Sensing Intrinsic Amplitude: 18.5 mV
Lead Channel Setting Pacing Amplitude: 1.75 V
Lead Channel Setting Pacing Amplitude: 2 V
Lead Channel Setting Pacing Amplitude: 2.5 V
Lead Channel Setting Pacing Pulse Width: 0.4 ms
Lead Channel Setting Pacing Pulse Width: 0.4 ms
MDC IDC LEAD IMPLANT DT: 20010806
MDC IDC LEAD IMPLANT DT: 20180813
MDC IDC LEAD LOCATION: 753859
MDC IDC LEAD LOCATION: 753860
MDC IDC LEAD SERIAL: 104581
MDC IDC MSMT BATTERY REMAINING LONGEVITY: 101 mo
MDC IDC MSMT BATTERY VOLTAGE: 2.99 V
MDC IDC MSMT LEADCHNL LV IMPEDANCE VALUE: 232.653
MDC IDC MSMT LEADCHNL LV IMPEDANCE VALUE: 456 Ohm
MDC IDC MSMT LEADCHNL LV IMPEDANCE VALUE: 475 Ohm
MDC IDC MSMT LEADCHNL LV IMPEDANCE VALUE: 532 Ohm
MDC IDC MSMT LEADCHNL LV IMPEDANCE VALUE: 836 Ohm
MDC IDC MSMT LEADCHNL LV IMPEDANCE VALUE: 836 Ohm
MDC IDC MSMT LEADCHNL LV PACING THRESHOLD PULSEWIDTH: 0.4 ms
MDC IDC MSMT LEADCHNL RA IMPEDANCE VALUE: 551 Ohm
MDC IDC MSMT LEADCHNL RA PACING THRESHOLD AMPLITUDE: 0.5 V
MDC IDC MSMT LEADCHNL RA PACING THRESHOLD PULSEWIDTH: 0.4 ms
MDC IDC MSMT LEADCHNL RA SENSING INTR AMPL: 3.5 mV
MDC IDC PG IMPLANT DT: 20180813
MDC IDC SET LEADCHNL RV SENSING SENSITIVITY: 0.3 mV

## 2017-09-19 NOTE — Progress Notes (Signed)
Patient Care Team: [name], MD as PCP - General (Family Medicine)   HPI  [name] is a 77 y.o. 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 He has a history of bypass surgery.  His last nuclear perfusion study was in November 2013 which showed a large area of scar in the LAD territory (extent 44%) involving the mid to apical anterior, apical and infero-apical to mid infero-septal and apical lateral wall without associated ischemia.  DATE TEST EF   3/16    Echo 20-25 %   12/17    Echo  20-25 %   3/19 Echo  20-25%     Date Cr K Hgb  8/18   12.6  6/19 0.89 3.5            He has a history of paroxysmal atrial fibrillation and is on warfarin. hhe's hospitalized 5/16 and was started on dofetilide. This was subsequently discontinued    He underwent CRT upgrade 8/18.  This was complicated by a wound hematoma  He denies chest pain.  There is no edema.  He has some mild shortness of breath.  No lightheadedness.  No bleeding.  No interval palpitations.     Past Medical History:  Diagnosis Date  . Acute on chronic systolic CHF (congestive heart failure) ([place]) 06/09/2015  . 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 Cardiomyopathy, ischemic 2011   with EF 25-35% by echo  . Coronary artery disease    Hx MI 1992, CABG 1998 , Nuc study 11.2013 large scar but no ischemia  . GERD (gastroesophageal reflux disease)   . H. pylori infection    Hx of   . 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 NSVT (nonsustained ventricular tachycardia) ([place])   . Paroxysmal atrial fibrillation [facility])     Past Surgical History:  Procedure Laterality Date  . BIV UPGRADE N/A 09/10/2016   Procedure: BiV ICD Upgrade;  Surgeon: Deboraha Sprang, MD;  Location: [place] [facility];  Service: Cardiovascular;  Laterality: N/A;  . CATARACT EXTRACTION, BILATERAL     . CORONARY ARTERY BYPASS GRAFT  1998   x 5  . HERNIA REPAIR  1960's   right inguinal  . ICD GENERATOR CHANGE  01/2008, 2018   medtronic, hx+ EP study  . KNEE SURGERY     right  . NASAL SINUS SURGERY  05/31/2010   Dr. Benjamine Mola    Current Outpatient Medications  Medication Sig Dispense Refill  . acetaminophen (TYLENOL) 650 MG CR tablet Take 650 mg by mouth 2 (two) times daily.     Marland Kitchen allopurinol (ZYLOPRIM) 300 MG tablet Take 300 mg by mouth daily.    . AMBULATORY NON FORMULARY MEDICATION Medication Name: Hydrocortisone cream 1%: Apply to the rectum once a day for a week, then as needed    . bisoprolol (ZEBETA) 5 MG tablet Take 0.5 tablets (2.5 mg total) daily by mouth. 45 tablet 3  . cetirizine (ZYRTEC) 10 MG tablet Take 10 mg by mouth daily.      . Cholecalciferol (VITAMIN D) 2000 units CAPS Take 1-2 capsules by mouth daily.    . colchicine 0.6 MG tablet Take 0.6 mg by mouth daily as needed.    . dicyclomine (BENTYL) 10 MG capsule TAKE 1 CAPSULE BY MOUTH EVERY 8 HOURS AS NEEDED FOR SPASMS 90 capsule  1  . isosorbide mononitrate (IMDUR) 30 MG 24 hr tablet Take 15 mg by mouth daily.    Marland Kitchen LORazepam (ATIVAN) 1 MG tablet Take 0.25-0.5 mg by mouth as needed.     Marland Kitchen losartan (COZAAR) 25 MG tablet Take 1 tablet by mouth 2 (two) times daily.    . magnesium oxide (MAG-OX) 400 MG tablet Take 400 mg by mouth daily at 12 noon.     . metolazone (ZAROXOLYN) 2.5 MG tablet Take 1 tablet (2.5 mg total) by mouth daily as needed (if weight 135 lbs or more). Take 40 meq potassium if you take this pill. 2 tablet 0  . mometasone (ASMANEX) 220 MCG/INH inhaler Inhale 1 puff into the lungs daily.     . montelukast (SINGULAIR) 10 MG tablet Take 1 tablet (10 mg total) by mouth at bedtime. PLEASE SCHEDULE APPOINTMENT. 30 tablet 0  . nitroGLYCERIN (NITROSTAT) 0.4 MG SL tablet Place 1 tablet (0.4 mg total) under the tongue every 5 (five) minutes as needed for chest pain. 25 tablet 1  . omeprazole (PRILOSEC) 20 MG capsule Take  20 mg by mouth every morning.    Vladimir Faster Glycol-Propyl Glycol (SYSTANE OP) Apply 2-3 drops to eye 3 (three) times daily as needed (dry eyes).    . potassium chloride (K-DUR,KLOR-CON) 10 MEQ tablet Take 5 mEq by mouth daily.    . silodosin (RAPAFLO) 8 MG CAPS capsule Take 8 mg by mouth every other day.    . simvastatin (ZOCOR) 40 MG tablet Take 1 tablet (40 mg total) by mouth at bedtime. 90 tablet 1  . spironolactone (ALDACTONE) 25 MG tablet TAKE ONE TABLET BY MOUTH ONCE DAILY 90 tablet 3  . torsemide (DEMADEX) 20 MG tablet Take 1 tablet (20 mg total) by mouth daily. 90 tablet 3  . warfarin (COUMADIN) 5 MG tablet TAKE 1/2 TO 1 (ONE-HALF TO ONE) TABLET BY MOUTH ONCE DAILY AS DIRECTED BY COUMADIN CLINIC 90 tablet 1   No current facility-administered medications for this visit.     Allergies  Allergen Reactions  . Amoxicillin Rash    Has patient had a PCN reaction causing immediate rash, facial/tongue/throat swelling, SOB or lightheadedness with hypotension: Yes Has patient had a PCN reaction causing severe rash involving mucus membranes or skin necrosis: No Has patient had a PCN reaction that required hospitalization: No Has patient had a PCN reaction occurring within the last 10 years: No If all of the above answers are "NO", then may proceed with Cephalosporin use.   Marland Kitchen Penicillins Rash    Has patient had a PCN reaction causing immediate rash, facial/tongue/throat swelling, SOB or lightheadedness with hypotension: Yes Has patient had a PCN reaction causing severe rash involving mucus membranes or skin necrosis: No Has patient had a PCN reaction that required hospitalization: No Has patient had a PCN reaction occurring within the last 10 years: No If all of the above answers are "NO", then may proceed with Cephalosporin use.     Review of Systems negative except from HPI and PMH  Physical Exam BP 102/64   Pulse 81   Ht 5\' 7"  (1.702 m)   Wt 145 lb 6.4 oz (66 kg)   SpO2 95%   BMI  22.77 kg/m  Well developed and nourished in no acute distress HENT normal Neck supple with JVP-flat Clear Device pocket well healed; without hematoma or erythema.  There is no tethering  Regular rate and rhythm, no murmurs or gallops Abd-soft with active BS No Clubbing cyanosis  edema Skin-warm and dry A & Oriented  Grossly normal sensory and motor function   ECG demonstrates AV pacing with an upright QRS lead V1 negative QRS lead I morning     Assessment and  Plan  Atrial fibrillation-paroxysmal  Sinus bradycardia/ chronotropic incompetence  Ischemic cardiomyopathy  IVCD  Congestive heart failure-chronic-systolic  CRT ICD Medtronic The patient's device was interrogated.  The information was reviewed. No changes were made in the programming.       No intercurrent Ventricular tachycardia  No intercurrent atrial fibrillation or flutter--  Euvolemic continue current meds  Without symptoms of ischemia

## 2017-09-19 NOTE — Patient Instructions (Signed)
Medication Instructions:  Your physician recommends that you continue on your current medications as directed. Please refer to the Current Medication list given to you today.   Labwork: You will have labs drawn today: CBC   Testing/Procedures: None ordered.  Follow-Up: Your physician wants you to follow-up in: One Year with Dr [name]. You will receive a reminder letter in the mail two months in advance. If you don't receive a letter, please call our office to schedule the follow-up appointment.  Remote monitoring is used to monitor your Pacemaker of ICD from home. This monitoring reduces the number of office visits required to check your device to one time per year. It allows [name] to keep an eye on the functioning of your device to ensure it is working properly. You are scheduled for a device check from home on 10/21/2017. You may send your transmission at any time that day. If you have a wireless device, the transmission will be sent automatically. After your physician reviews your transmission, you will receive a postcard with your next transmission date.     Any Other Special Instructions Will Be Listed Below (If Applicable).     If you need a refill on your cardiac medications before your next appointment, please call your pharmacy.

## 2017-09-20 ENCOUNTER — Ambulatory Visit (HOSPITAL_COMMUNITY)
Admission: RE | Admit: 2017-09-20 | Discharge: 2017-09-20 | Disposition: A | Discharge status: Home / Self Care | Payer: Medicare Other | Source: Ambulatory Visit | Attending: Gastroenterology | Admitting: Gastroenterology

## 2017-09-20 DIAGNOSIS — R6881 Early satiety: Secondary | ICD-10-CM | POA: Insufficient documentation

## 2017-09-20 DIAGNOSIS — K219 Gastro-esophageal reflux disease without esophagitis: Secondary | ICD-10-CM | POA: Insufficient documentation

## 2017-09-20 DIAGNOSIS — R140 Abdominal distension (gaseous): Secondary | ICD-10-CM | POA: Insufficient documentation

## 2017-09-20 DIAGNOSIS — K224 Dyskinesia of esophagus: Secondary | ICD-10-CM | POA: Diagnosis not present

## 2017-09-20 DIAGNOSIS — R198 Other specified symptoms and signs involving the digestive system and abdomen: Secondary | ICD-10-CM | POA: Insufficient documentation

## 2017-09-20 DIAGNOSIS — K5710 Diverticulosis of small intestine without perforation or abscess without bleeding: Secondary | ICD-10-CM | POA: Insufficient documentation

## 2017-09-20 LAB — CBC
HEMATOCRIT: 40 % (ref 37.5–51.0)
Hemoglobin: 13.4 g/dL (ref 13.0–17.7)
MCH: 28.6 pg (ref 26.6–33.0)
MCHC: 33.5 g/dL (ref 31.5–35.7)
MCV: 86 fL (ref 79–97)
PLATELETS: 267 10*3/uL (ref 150–450)
RBC: 4.68 x10E6/uL (ref 4.14–5.80)
RDW: 14 % (ref 12.3–15.4)
WBC: 9.8 10*3/uL (ref 3.4–10.8)

## 2017-09-23 ENCOUNTER — Other Ambulatory Visit (HOSPITAL_COMMUNITY): Payer: Self-pay | Admitting: *Deleted

## 2017-09-23 ENCOUNTER — Other Ambulatory Visit (HOSPITAL_COMMUNITY): Payer: Self-pay | Admitting: Pharmacist

## 2017-09-23 MED ORDER — POTASSIUM CHLORIDE CRYS ER 10 MEQ PO TBCR: 5.0000 meq | EXTENDED_RELEASE_TABLET | Freq: Every day | ORAL | 3 refills | Status: DC

## 2017-09-26 ENCOUNTER — Telehealth: Payer: Self-pay | Admitting: Gastroenterology

## 2017-09-26 NOTE — Telephone Encounter (Signed)
Patient had some bright red rectal bleeding one time last night and again this morning with a bowel movement. He denies SOB, no rectal pain but does have discomfort. Patient is still taking his coumadin. After reviewing last note from ov with Janett Billow, let patient know about possibility of colonoscopy or flex sig to be scheduled for further evaluation but am going to pass this onto [name] for recommendations.

## 2017-09-26 NOTE — Telephone Encounter (Signed)
[name] I can see him in that banding slot if he wants to be seen. Please convert to office visit. Thanks

## 2017-09-26 NOTE — Telephone Encounter (Signed)
You have a banding slot available tomorrow, I gave him your recommendations about colonoscopy. He did not want to schedule.

## 2017-09-26 NOTE — Telephone Encounter (Signed)
Let patient know Dr. Doyne Keel recommendations to further evaluate would be a colonoscopy at the hospital. Patient will think about it, and if he decides to schedule will call back.

## 2017-09-26 NOTE — Telephone Encounter (Signed)
Thanks Almyra Free. While I suspect he is likely having hemorrhoidal bleeding, he's never had a prior colonoscopy and despite negative Cologuard, with his bleeding a colonoscopy is recommended. He had previously declined this. I think it is warranted given his symptoms persist. I'd recommend a colonoscopy to be done at the hospital given his EF of 20%. He will also need clearance to hold Coumadin for 5 days prior to the procedure from his prescribing provider. Otherwise he had a CBC recently which showed a normal Hgb and that is reassuring.  Can you help coordinate colonoscopy at the hospital in my next available hospital block, and obtain approval to hold coumadin? Thanks

## 2017-09-27 ENCOUNTER — Ambulatory Visit (INDEPENDENT_AMBULATORY_CARE_PROVIDER_SITE_OTHER): Payer: Medicare Other | Admitting: Gastroenterology

## 2017-09-27 ENCOUNTER — Other Ambulatory Visit (INDEPENDENT_AMBULATORY_CARE_PROVIDER_SITE_OTHER): Payer: Medicare Other

## 2017-09-27 ENCOUNTER — Telehealth: Payer: Self-pay | Admitting: Gastroenterology

## 2017-09-27 ENCOUNTER — Encounter: Payer: Self-pay | Admitting: Gastroenterology

## 2017-09-27 VITALS — BP 102/68 | HR 74 | Ht 67.0 in | Wt 146.1 lb

## 2017-09-27 DIAGNOSIS — K648 Other hemorrhoids: Secondary | ICD-10-CM

## 2017-09-27 DIAGNOSIS — I255 Ischemic cardiomyopathy: Secondary | ICD-10-CM

## 2017-09-27 DIAGNOSIS — Z7901 Long term (current) use of anticoagulants: Secondary | ICD-10-CM

## 2017-09-27 DIAGNOSIS — K625 Hemorrhage of anus and rectum: Secondary | ICD-10-CM | POA: Diagnosis not present

## 2017-09-27 LAB — HEMOGLOBIN: Hemoglobin: 14.2 g/dL (ref 13.0–17.0)

## 2017-09-27 NOTE — Patient Instructions (Signed)
If you are age 78 or older, your body mass index should be between 23-30. Your Body mass index is 22.89 kg/m. If this is out of the aforementioned range listed, please consider follow up with your Primary Care Provider.  If you are age 58 or younger, your body mass index should be between 19-25. Your Body mass index is 22.89 kg/m. If this is out of the aformentioned range listed, please consider follow up with your Primary Care Provider.   Please go to the lab in the basement of our building to have lab work done as you leave today.  Thank you for entrusting me with your care and for choosing [facility], Dr. Tupman Cellar

## 2017-09-27 NOTE — Telephone Encounter (Signed)
Please contact patient and put him in the banding slot, if you need help converting it to an office visit please get Yesi to help. Thank you.

## 2017-09-27 NOTE — Telephone Encounter (Signed)
Patient called back to schedule an appointment.

## 2017-09-27 NOTE — Telephone Encounter (Signed)
Patient has been scheduled for a follow up visit with Dr.Armbruster 09/27/17 at 3:45pm in banding spot.

## 2017-09-27 NOTE — Progress Notes (Signed)
HPI :  77 year old male with a history of coronary artery disease and NSVT, history of CHF with EF 20%, here for a follow up visit for rectal bleeding.  He has known internal hemorrhoids, he has historically had some scant rectal bleeding over time, he's had some sporadic leaking of blood into his underwear in recent days. He reports this is painless.  His bowel movements have been regular otherwise, and does not see much blood with bowel movements. He denies any lightheadedness or dizziness. He denies any constipation or straining. He remains on Coumadin for his cardiac history. Recent INR 3. He has declined colonoscopy in the past we have discussed this option for colon cancer screening. has had screening with colo-guard in both 2015 and 2018 which were both negative. He has no family history of colon cancer. He has been avoiding elective endoscopic evaluations given his cardiac history. He's never had a prior colonoscopy.     Past Medical History:  Diagnosis Date  . Acute on chronic systolic CHF (congestive heart failure) ([place]) 06/09/2015  . 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 Cardiomyopathy, ischemic 2011   with EF 25-35% by echo  . Coronary artery disease    Hx MI 1992, CABG 1998 , Nuc study 11.2013 large scar but no ischemia  . GERD (gastroesophageal reflux disease)   . H. pylori infection    Hx of   . 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 NSVT (nonsustained ventricular tachycardia) ([place])   . Paroxysmal atrial fibrillation [facility])      Past Surgical History:  Procedure Laterality Date  . BIV UPGRADE N/A 09/10/2016   Procedure: BiV ICD Upgrade;  Surgeon: Deboraha Sprang, MD;  Location: [place] [facility];  Service: Cardiovascular;  Laterality: N/A;  . CATARACT EXTRACTION, BILATERAL    . CORONARY ARTERY BYPASS GRAFT  1998   x 5  . HERNIA REPAIR  1960's   right inguinal  . ICD GENERATOR  CHANGE  01/2008, 2018   medtronic, hx+ EP study  . KNEE SURGERY     right  . NASAL SINUS SURGERY  05/31/2010   Dr. Benjamine Mola   Family History  Problem Relation Age of Onset  . Heart disease Father        questionable  . Stroke Mother   . Heart failure Sister   . Healthy Brother   . Healthy Sister   . Parkinsonism Brother   . Colon cancer Neg Hx    Social History   Tobacco Use  . Smoking status: Never Smoker  . Smokeless tobacco: Never Used  Substance Use Topics  . Alcohol use: Yes    Alcohol/week: 2.0 standard drinks    Types: 2 Glasses of wine per week    Comment: socially  . Drug use: No   Current Outpatient Medications  Medication Sig Dispense Refill  . acetaminophen (TYLENOL) 650 MG CR tablet Take 650 mg by mouth 2 (two) times daily.     Marland Kitchen allopurinol (ZYLOPRIM) 300 MG tablet Take 300 mg by mouth daily.    . AMBULATORY NON FORMULARY MEDICATION Medication Name: Hydrocortisone cream 1%: Apply to the rectum once a day for a week, then as needed    . bisoprolol (ZEBETA) 5 MG tablet Take 0.5 tablets (2.5 mg total) daily by mouth. 45 tablet 3  . cetirizine (ZYRTEC) 10 MG tablet Take 10 mg by mouth daily.      Marland Kitchen  Cholecalciferol (VITAMIN D) 2000 units CAPS Take 1-2 capsules by mouth daily.    . colchicine 0.6 MG tablet Take 0.6 mg by mouth daily as needed.    . dicyclomine (BENTYL) 10 MG capsule TAKE 1 CAPSULE BY MOUTH EVERY 8 HOURS AS NEEDED FOR SPASMS 90 capsule 1  . isosorbide mononitrate (IMDUR) 30 MG 24 hr tablet Take 15 mg by mouth daily.    Marland Kitchen LORazepam (ATIVAN) 1 MG tablet Take 0.25-0.5 mg by mouth as needed.     Marland Kitchen losartan (COZAAR) 25 MG tablet Take 1 tablet by mouth 2 (two) times daily.    . magnesium oxide (MAG-OX) 400 MG tablet Take 400 mg by mouth daily at 12 noon.     . metolazone (ZAROXOLYN) 2.5 MG tablet Take 1 tablet (2.5 mg total) by mouth daily as needed (if weight 135 lbs or more). Take 40 meq potassium if you take this pill. 2 tablet 0  . mometasone (ASMANEX) 220  MCG/INH inhaler Inhale 1 puff into the lungs daily.     . montelukast (SINGULAIR) 10 MG tablet Take 1 tablet (10 mg total) by mouth at bedtime. PLEASE SCHEDULE APPOINTMENT. 30 tablet 0  . nitroGLYCERIN (NITROSTAT) 0.4 MG SL tablet Place 1 tablet (0.4 mg total) under the tongue every 5 (five) minutes as needed for chest pain. 25 tablet 1  . omeprazole (PRILOSEC) 20 MG capsule Take 20 mg by mouth every morning.    Vladimir Faster Glycol-Propyl Glycol (SYSTANE OP) Apply 2-3 drops to eye 3 (three) times daily as needed (dry eyes).    . potassium chloride (K-DUR,KLOR-CON) 10 MEQ tablet Take 0.5 tablets (5 mEq total) by mouth daily. 45 tablet 3  . silodosin (RAPAFLO) 8 MG CAPS capsule Take 8 mg by mouth every other day.    . simvastatin (ZOCOR) 40 MG tablet Take 1 tablet (40 mg total) by mouth at bedtime. 90 tablet 1  . spironolactone (ALDACTONE) 25 MG tablet TAKE ONE TABLET BY MOUTH ONCE DAILY 90 tablet 3  . torsemide (DEMADEX) 20 MG tablet Take 1 tablet (20 mg total) by mouth daily. 90 tablet 3  . warfarin (COUMADIN) 5 MG tablet TAKE 1/2 TO 1 (ONE-HALF TO ONE) TABLET BY MOUTH ONCE DAILY AS DIRECTED BY COUMADIN CLINIC 90 tablet 1   No current facility-administered medications for this visit.    Allergies  Allergen Reactions  . Amoxicillin Rash    Has patient had a PCN reaction causing immediate rash, facial/tongue/throat swelling, SOB or lightheadedness with hypotension: Yes Has patient had a PCN reaction causing severe rash involving mucus membranes or skin necrosis: No Has patient had a PCN reaction that required hospitalization: No Has patient had a PCN reaction occurring within the last 10 years: No If all of the above answers are "NO", then may proceed with Cephalosporin use.   Marland Kitchen Penicillins Rash    Has patient had a PCN reaction causing immediate rash, facial/tongue/throat swelling, SOB or lightheadedness with hypotension: Yes Has patient had a PCN reaction causing severe rash involving mucus  membranes or skin necrosis: No Has patient had a PCN reaction that required hospitalization: No Has patient had a PCN reaction occurring within the last 10 years: No If all of the above answers are "NO", then may proceed with Cephalosporin use.      Review of Systems: All systems reviewed and negative except where noted in HPI.    Dg Ugi W/high Density W/kub  Result Date: 09/20/2017 CLINICAL DATA:  Upper abdominal fullness and bloating  with early satiety for the past month. EXAM: UPPER GI SERIES WITH KUB TECHNIQUE: After obtaining a scout radiograph a routine upper GI series was performed using thin and high density barium. FLUOROSCOPY TIME:  Fluoroscopy Time:  2 minutes, 12 seconds. Radiation Exposure Index (if provided by the fluoroscopic device): 30.8 mGy Number of Acquired Spot Images: 0 COMPARISON:  None. FINDINGS: Preliminary KUB demonstrates a nonobstructive bowel gas pattern. Something is Primary peristaltic waves in the esophagus were essentially normal, with only occasional very mild tertiary contractions. No esophageal stricture, ulceration, or other significant abnormality. There is mild spontaneous gastroesophageal reflux into the lower esophagus. No hiatal hernia. A 13 mm barium tablet passed without difficulty into the stomach. Gastric morphology and appearance appear normal. There is a small diverticula arising from the second portion of the duodenum. Proximal duodenum otherwise appears normal. IMPRESSION: 1. Minimal nonspecific esophageal dysmotility. 2. Mild spontaneous gastroesophageal reflux.  No hiatal hernia. 3. Small duodenal diverticulum. Electronically Signed   By: Titus Dubin M.D.   On: 09/20/2017 10:07    Physical Exam: BP 102/68   Pulse 74   Ht 5\' 7"  (1.702 m)   Wt 146 lb 2 oz (66.3 kg)   BMI 22.89 kg/m  Constitutional: Pleasant,well-developed, male in no acute distress. HEENT: Normocephalic and atraumatic. Conjunctivae are normal. No scleral icterus. Neck  supple.  Cardiovascular: Normal rate, regular rhythm.  Pulmonary/chest: Effort normal and breath sounds normal. No wheezing, rales or rhonchi. Abdominal: Soft, nondistended, nontender. There are no masses palpable. No hepatomegaly. DRE / Anoscopy - internal hemorrhoids inflamed, moderate to severe, no active bleeding, no mass lesion Extremities: no edema Lymphadenopathy: No cervical adenopathy noted. Neurological: Alert and oriented to person place and time. Skin: Skin is warm and dry. No rashes noted. Psychiatric: Normal mood and affect. Behavior is normal.   ASSESSMENT AND PLAN: 77 year old male with significant cardiac history as outlined above, on Coumadin, here for reassessment following issues:  Rectal bleeding / internal hemorrhoids / anticoagulated - ongoing intermittent painless rectal bleeding in the setting of anticoagulation. On DRE and anoscopy he has inflamed internal hemorrhoids which are the most likely cause for his symptoms. We checked his Hgb in light of worsening bleeding today and it's 14.2 and reassuring. We discussed that this is the likely cause for his symptoms, however he has never had a prior colonoscopy and we can't be certain without looking. We discussed options and he continues to wish to avoid colonoscopy at this time, and treat hemorrhoids first. I discussed hemorrhoid treatment with him, he is higher risk for bleeding in the setting of Coumadin. If he is able to hold coumadin for a period of time I offered him a hemorrhoid banding, which has much less risk than hemorrhoidectomy. I will reach out to his cardiologist's office to see if he can hold this perhaps 3 days prior to the banding, and an additional few days after to minimize his risk of bleeding. Hopefully we can coordinate this to band the hemorrhoids next week. If hemorrhoid banding does not control his symptoms and the symptoms persist, it in that setting he would be agreeable to colonoscopy. I will get in  touch with him as soon as I hear back from the cardiologist to discuss timing of banding if he is cleared to hold the  Coumadin. He agreed with the plan.  [place] Cellar, MD [facility]

## 2017-09-27 NOTE — Telephone Encounter (Signed)
Called patient after I spoke with Dr. Zoila Shutter. Patient is at risk for CVA given A fibb, cleared to hold the coumadin however he understands balancing risks of CVA and bleeding with hemorrhoid banding. I can fit him in at 1130 on Sept 6th for banding. I asked him to hold the Coumadin starting Tuesday Sept 3rd (main risk for bleeding is when the band falls off, not time of placement), and may resume it Sunday or so. He agreed with the plan after discussion of issues.   Jan, can you help put him in the scheduled for Sept 6th at 1130 for a banding. I have relayed instructions on Coumadin to him already and he is aware of appointment time. Thanks.

## 2017-10-01 NOTE — Telephone Encounter (Signed)
Scheduled pt for Friday, 9-6 at 11:30pm

## 2017-10-04 ENCOUNTER — Encounter: Payer: Medicare Other | Admitting: Gastroenterology

## 2017-10-04 ENCOUNTER — Ambulatory Visit (INDEPENDENT_AMBULATORY_CARE_PROVIDER_SITE_OTHER): Payer: Medicare Other | Admitting: Gastroenterology

## 2017-10-04 ENCOUNTER — Encounter: Payer: Self-pay | Admitting: Gastroenterology

## 2017-10-04 VITALS — BP 102/60 | HR 80 | Ht 65.5 in | Wt 144.5 lb

## 2017-10-04 DIAGNOSIS — K648 Other hemorrhoids: Secondary | ICD-10-CM

## 2017-10-04 NOTE — Progress Notes (Signed)
77 y/o male here for follow up for hemorrhoid banding.  He has a history of CHF and AF on Coumadin, with ongoing painless bleeding suspected related to hemorrhoids. See prior clinic note for details - he has declined colonoscopy, and is requesting hemorrhoid banding. I have spoken with Dr. Zoila Shutter his cardiologist about the issue previously. Given his ongoing symptoms we are trying to treat the patient's hemorrhoids nonsurgically, acknowledging he is at higher risk for bleeding with the Coumadin, but also trying to minimize his CVA risk and not hold the coumadin too long for this procedure. While he is at risk for bleeding upwards of 2 weeks after banding, I don't think we should hold the coumadin too long. The patient had approval to hold Coumadin, off since 9/3. I discussed risks / benefits of banding and he wanted to proceed. Plan on resuming coumadin on 9/8.    PROCEDURE NOTE: The patient presents with symptomatic grade II  hemorrhoids, requesting rubber band ligation of his/her hemorrhoidal disease.  All risks, benefits and alternative forms of therapy were described and informed consent was obtained.  In the Left Lateral Decubitus position anoscopic examination revealed grade II hemorrhoids in the all positions, most inflamed in LL.  The anorectum was pre-medicated with 0.125% nitroglycerin The decision was made to band the LL internal hemorrhoid, and the [place] was used to perform band ligation without complication.  Digital anorectal examination was then performed to assure proper positioning of the band, and to adjust the banded tissue as required.  The patient was discharged home without pain or other issues.  Dietary and behavioral recommendations were given and along with follow-up instructions.     The patient will return in 2-4 weeks for  follow-up and possible additional banding as required. He will resume Coumadin on 9/8.  No complications were encountered and the patient  tolerated the procedure well.  [place] Cellar, MD [facility]

## 2017-10-04 NOTE — Patient Instructions (Addendum)
If you are age 78 or older, your body mass index should be between 23-30. Your Body mass index is 23.68 kg/m. If this is out of the aforementioned range listed, please consider follow up with your Primary Care Provider.  If you are age 38 or younger, your body mass index should be between 19-25. Your Body mass index is 23.68 kg/m. If this is out of the aformentioned range listed, please consider follow up with your Primary Care Provider.   HEMORRHOID BANDING PROCEDURE    FOLLOW-UP CARE   1. The procedure you have had should have been relatively painless since the banding of the area involved does not have nerve endings and there is no pain sensation.  The rubber band cuts off the blood supply to the hemorrhoid and the band may fall off as soon as 48 hours after the banding (the band may occasionally be seen in the toilet bowl following a bowel movement). You may notice a temporary feeling of fullness in the rectum which should respond adequately to plain Tylenol or Motrin.  2. Following the banding, avoid strenuous exercise that evening and resume full activity the next day.  A sitz bath (soaking in a warm tub) or bidet is soothing, and can be useful for cleansing the area after bowel movements.     3. To avoid constipation, take two tablespoons of natural wheat bran, natural oat bran, flax, Benefiber or any over the counter fiber supplement and increase your water intake to 7-8 glasses daily.    4. Unless you have been prescribed anorectal medication, do not put anything inside your rectum for two weeks: No suppositories, enemas, fingers, etc.  5. Occasionally, you may have more bleeding than usual after the banding procedure.  This is often from the untreated hemorrhoids rather than the treated one.  Don't be concerned if there is a tablespoon or so of blood.  If there is more blood than this, lie flat with your bottom higher than your head and apply an ice pack to the area. If the bleeding  does not stop within a half an hour or if you feel faint, call our office at [phone] or go to the emergency room.  6. Problems are not common; however, if there is a substantial amount of bleeding, severe pain, chills, fever or difficulty passing urine (very rare) or other problems, you should call us at [phone] or report to the nearest emergency room.  7. Do not stay seated continuously for more than 2-3 hours for a day or two after the procedure.  Tighten your buttock muscles 10-15 times every two hours and take 10-15 deep breaths every 1-2 hours.  Do not spend more than a few minutes on the toilet if you cannot empty your bowel; instead re-visit the toilet at a later time.  Follow up appointment for second banding on October 28, 2017 at 4:15.  Thank you for choosing me and [facility].   Goree Cellar, MD

## 2017-10-08 ENCOUNTER — Telehealth: Payer: Self-pay | Admitting: Gastroenterology

## 2017-10-09 NOTE — Telephone Encounter (Signed)
Left voicemail for patient to call back. 

## 2017-10-09 NOTE — Telephone Encounter (Signed)
Can he do October 10th at 1130? Thanks for your help.

## 2017-10-09 NOTE — Telephone Encounter (Signed)
[name] will be back in town on 11/01/17. He cannot come on Thursday mornings.

## 2017-10-09 NOTE — Telephone Encounter (Signed)
Pt called again regarding rescheduling appt for banding. Pt is getting impatient and is requesting a call back today. He is aware that Jan is off and wants that the person who is covering to call him.

## 2017-10-10 NOTE — Telephone Encounter (Signed)
Dr Havery Moros has given verbal order that patient may come on 11/06/17 at 430 pm since 11/07/17 is a Thursday morning and patient already has another Dr appointment. Patient states that 11/06/17 at 430 will work for him. Appointment has been made.

## 2017-10-11 ENCOUNTER — Other Ambulatory Visit (HOSPITAL_COMMUNITY): Payer: Self-pay

## 2017-10-11 MED ORDER — LOSARTAN POTASSIUM 25 MG PO TABS: 25.0000 mg | ORAL_TABLET | Freq: Every day | ORAL | 2 refills | Status: DC

## 2017-10-13 ENCOUNTER — Other Ambulatory Visit: Payer: Self-pay | Admitting: Gastroenterology

## 2017-10-15 ENCOUNTER — Ambulatory Visit: Payer: Medicare Other | Admitting: Gastroenterology

## 2017-10-18 ENCOUNTER — Ambulatory Visit (INDEPENDENT_AMBULATORY_CARE_PROVIDER_SITE_OTHER): Payer: Medicare Other | Admitting: Pharmacist Clinician (PhC)/ Clinical Pharmacy Specialist

## 2017-10-18 DIAGNOSIS — I4891 Unspecified atrial fibrillation: Secondary | ICD-10-CM | POA: Diagnosis not present

## 2017-10-18 DIAGNOSIS — Z5181 Encounter for therapeutic drug level monitoring: Secondary | ICD-10-CM

## 2017-10-18 DIAGNOSIS — Z7901 Long term (current) use of anticoagulants: Secondary | ICD-10-CM

## 2017-10-18 DIAGNOSIS — I480 Paroxysmal atrial fibrillation: Secondary | ICD-10-CM

## 2017-10-18 LAB — POCT INR: INR: 2.4 (ref 2.0–3.0)

## 2017-10-21 ENCOUNTER — Ambulatory Visit (INDEPENDENT_AMBULATORY_CARE_PROVIDER_SITE_OTHER): Payer: Medicare Other | Admitting: *Deleted

## 2017-10-21 ENCOUNTER — Ambulatory Visit (INDEPENDENT_AMBULATORY_CARE_PROVIDER_SITE_OTHER): Payer: Medicare Other

## 2017-10-21 DIAGNOSIS — K219 Gastro-esophageal reflux disease without esophagitis: Secondary | ICD-10-CM | POA: Diagnosis not present

## 2017-10-21 DIAGNOSIS — I509 Heart failure, unspecified: Secondary | ICD-10-CM

## 2017-10-21 DIAGNOSIS — Z1389 Encounter for screening for other disorder: Secondary | ICD-10-CM | POA: Diagnosis not present

## 2017-10-21 DIAGNOSIS — I10 Essential (primary) hypertension: Secondary | ICD-10-CM | POA: Diagnosis not present

## 2017-10-21 DIAGNOSIS — I480 Paroxysmal atrial fibrillation: Secondary | ICD-10-CM | POA: Diagnosis not present

## 2017-10-21 DIAGNOSIS — I255 Ischemic cardiomyopathy: Secondary | ICD-10-CM | POA: Diagnosis not present

## 2017-10-21 DIAGNOSIS — E7800 Pure hypercholesterolemia, unspecified: Secondary | ICD-10-CM | POA: Diagnosis not present

## 2017-10-21 DIAGNOSIS — I2510 Atherosclerotic heart disease of native coronary artery without angina pectoris: Secondary | ICD-10-CM | POA: Diagnosis not present

## 2017-10-21 DIAGNOSIS — F5101 Primary insomnia: Secondary | ICD-10-CM | POA: Diagnosis not present

## 2017-10-21 DIAGNOSIS — Z95810 Presence of automatic (implantable) cardiac defibrillator: Secondary | ICD-10-CM

## 2017-10-21 DIAGNOSIS — I5022 Chronic systolic (congestive) heart failure: Secondary | ICD-10-CM | POA: Diagnosis not present

## 2017-10-21 NOTE — Progress Notes (Signed)
Remote ICD transmission.   

## 2017-10-21 NOTE — Progress Notes (Signed)
EPIC Encounter for ICM Monitoring  Patient Name: [name] is a 77 y.o. male Date: 10/21/2017 Primary Care Physican: [name], MD Primary Cardiologist:Kelly/Bensimhon Electrophysiologist: [name] Dry Weight:Previous weight 140-141lbs BiV Pacing: 95.7%      Heart Failure questions reviewed, pt asymptomatic.   Thoracic impedance normal.  Prescribed: Torsemide 20 mg 1 tabletdaily.  Potassium 10 mEq take 0.5 tablet daily.  Labs: 07/11/2017 Creatinine 0.89, BUN 12, Potassium 3.5, Sodium 139, EGFR >60 01/01/2017 Creatinine0.79, BUN13, Potassium4.5, Sodium141, [id number] 09/22/2016 Creatinine0.84, BUN13, Potassium3.8, Sodium135, EGFR>60  09/20/2016 Creatinine0.90, BUN19, Potassium4.1, Sodium138, [id number]  08/06/2018Creatinine 0.98, BUN12, Potassium3.4, Sodium137, [id number]  05/15/2018Creatinine 0.86, BUN14, Potassium3.9, Sodium140  02/19/2018Creatinine 1.13, BUN16, Potassium3.4, Sodium135, EGFR>60 A complete set of results can be found in Results Review  Recommendations: No changes.  Encouraged to call for fluid symptoms.  Follow-up plan: ICM clinic phone appointment on 11/21/2017.   Office appointment scheduled 01/07/2018 with Dr. [name].    Copy of ICM check sent to Dr. [name].   3 month ICM trend: 10/21/2017    1 Year ICM trend:       [name], RN 10/21/2017 10:47 AM

## 2017-10-22 ENCOUNTER — Encounter: Payer: Self-pay | Admitting: Cardiology

## 2017-10-23 DIAGNOSIS — Z23 Encounter for immunization: Secondary | ICD-10-CM | POA: Diagnosis not present

## 2017-10-28 ENCOUNTER — Encounter

## 2017-10-28 ENCOUNTER — Encounter: Payer: Medicare Other | Admitting: Gastroenterology

## 2017-11-03 LAB — CUP PACEART REMOTE DEVICE CHECK
Battery Remaining Longevity: 97 mo
Battery Voltage: 2.99 V
Brady Statistic AP VS Percent: 1.68 %
Brady Statistic AS VS Percent: 0.27 %
Date Time Interrogation Session: 20190923083724
HighPow Impedance: 50 Ohm
HighPow Impedance: 77 Ohm
Implantable Lead Implant Date: 20010806
Implantable Lead Implant Date: 20180813
Implantable Lead Implant Date: 20180813
Implantable Lead Location: 753860
Implantable Lead Model: 147
Implantable Lead Model: 5092
Lead Channel Impedance Value: 237.5 Ohm
Lead Channel Impedance Value: 246.635
Lead Channel Impedance Value: 456 Ohm
Lead Channel Impedance Value: 456 Ohm
Lead Channel Impedance Value: 475 Ohm
Lead Channel Impedance Value: 475 Ohm
Lead Channel Impedance Value: 513 Ohm
Lead Channel Impedance Value: 760 Ohm
Lead Channel Impedance Value: 836 Ohm
Lead Channel Impedance Value: 836 Ohm
Lead Channel Pacing Threshold Amplitude: 1.375 V
Lead Channel Pacing Threshold Pulse Width: 0.4 ms
Lead Channel Pacing Threshold Pulse Width: 0.4 ms
Lead Channel Sensing Intrinsic Amplitude: 18.875 mV
Lead Channel Sensing Intrinsic Amplitude: 2.875 mV
Lead Channel Sensing Intrinsic Amplitude: 2.875 mV
Lead Channel Setting Pacing Amplitude: 1.75 V
Lead Channel Setting Pacing Amplitude: 2 V
Lead Channel Setting Pacing Pulse Width: 0.4 ms
MDC IDC LEAD LOCATION: 753858
MDC IDC LEAD LOCATION: 753859
MDC IDC LEAD SERIAL: 104581
MDC IDC MSMT LEADCHNL LV IMPEDANCE VALUE: 232.653
MDC IDC MSMT LEADCHNL LV IMPEDANCE VALUE: 241.412
MDC IDC MSMT LEADCHNL LV IMPEDANCE VALUE: 246.635
MDC IDC MSMT LEADCHNL LV IMPEDANCE VALUE: 779 Ohm
MDC IDC MSMT LEADCHNL LV IMPEDANCE VALUE: 779 Ohm
MDC IDC MSMT LEADCHNL LV IMPEDANCE VALUE: 817 Ohm
MDC IDC MSMT LEADCHNL LV IMPEDANCE VALUE: 893 Ohm
MDC IDC MSMT LEADCHNL RA PACING THRESHOLD AMPLITUDE: 0.625 V
MDC IDC MSMT LEADCHNL RA PACING THRESHOLD PULSEWIDTH: 0.4 ms
MDC IDC MSMT LEADCHNL RV IMPEDANCE VALUE: 722 Ohm
MDC IDC MSMT LEADCHNL RV PACING THRESHOLD AMPLITUDE: 1.25 V
MDC IDC MSMT LEADCHNL RV SENSING INTR AMPL: 18.875 mV
MDC IDC PG IMPLANT DT: 20180813
MDC IDC SET LEADCHNL LV PACING PULSEWIDTH: 0.4 ms
MDC IDC SET LEADCHNL RV PACING AMPLITUDE: 2.5 V
MDC IDC SET LEADCHNL RV SENSING SENSITIVITY: 0.3 mV
MDC IDC STAT BRADY AP VP PERCENT: 91.1 %
MDC IDC STAT BRADY AS VP PERCENT: 6.95 %
MDC IDC STAT BRADY RA PERCENT PACED: 90.13 %
MDC IDC STAT BRADY RV PERCENT PACED: 0.44 %

## 2017-11-06 ENCOUNTER — Encounter: Payer: Self-pay | Admitting: Gastroenterology

## 2017-11-06 ENCOUNTER — Ambulatory Visit (INDEPENDENT_AMBULATORY_CARE_PROVIDER_SITE_OTHER): Payer: Medicare Other | Admitting: Gastroenterology

## 2017-11-06 VITALS — BP 90/54 | HR 88 | Ht 67.0 in | Wt 144.8 lb

## 2017-11-06 DIAGNOSIS — K589 Irritable bowel syndrome without diarrhea: Secondary | ICD-10-CM | POA: Diagnosis not present

## 2017-11-06 DIAGNOSIS — K648 Other hemorrhoids: Secondary | ICD-10-CM

## 2017-11-06 MED ORDER — AMBULATORY NON FORMULARY MEDICATION: 0 refills | Status: DC

## 2017-11-06 NOTE — Patient Instructions (Addendum)
If you are age 78 or older, your body mass index should be between 23-30. Your Body mass index is 22.68 kg/m. If this is out of the aforementioned range listed, please consider follow up with your Primary Care Provider.  If you are age 45 or younger, your body mass index should be between 19-25. Your Body mass index is 22.68 kg/m. If this is out of the aformentioned range listed, please consider follow up with your Primary Care Provider.    We are giving you a Low FOD-MAP diet today.  We are giving you samples of IBgard to try. If you like these you can purchase some over the counter.   Thank you for entrusting me with your care and for choosing [facility], Dr. Berwyn Cellar

## 2017-11-06 NOTE — Progress Notes (Signed)
HPI :  77 y/o male here for follow up for hemorrhoids and IBS  He has a history of CHF and AF on Coumadin, with ongoing painless bleeding suspected related to hemorrhoids. See prior clinic note for details - he has declined colonoscopy, and had requesting hemorrhoid banding. I have spoken with Dr. Zoila Shutter his cardiologist about the issue previously. Given his ongoing symptoms we are trying to treat the patient's hemorrhoids nonsurgically, acknowledging he is at higher risk for bleeding with the Coumadin, but also trying to minimize his CVA risk and not hold the coumadin. I previously banded the LL hemorrhoid on 10/04/17. Since doing that he reports he has not had any recurrent bleeding or problems with his hemorrhoids, he states it worked fairly well for him. He tolerated the procedure without any pain or bleeding.   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 He endorses ongoing gas and bloating with abdominal cramps which bother him from time to time. He eats a lot of fruits and vegetables in his diet. He asked about dietary options and other therapies available for bloating. I previously counseled him on a low FODMAP diet and may, unclear how much she is using this. He is using Bentyl which does help, he is taking it twice daily.   Past Medical History:  Diagnosis Date  . Acute on chronic systolic CHF (congestive heart failure) ([place]) 06/09/2015  . 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 Cardiomyopathy, ischemic 2011   with EF 25-35% by echo  . Coronary artery disease    Hx MI 1992, CABG 1998 , Nuc study 11.2013 large scar but no ischemia  . GERD (gastroesophageal reflux disease)   . H. pylori infection    Hx of   . 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 NSVT (nonsustained ventricular tachycardia) ([place])   . Paroxysmal atrial fibrillation [facility])      Past Surgical History:  Procedure Laterality Date  . BIV  UPGRADE N/A 09/10/2016   Procedure: BiV ICD Upgrade;  Surgeon: Deboraha Sprang, MD;  Location: [place] [facility];  Service: Cardiovascular;  Laterality: N/A;  . CATARACT EXTRACTION, BILATERAL    . CORONARY ARTERY BYPASS GRAFT  1998   x 5  . HERNIA REPAIR  1960's   right inguinal  . ICD GENERATOR CHANGE  01/2008, 2018   medtronic, hx+ EP study  . KNEE SURGERY     right  . NASAL SINUS SURGERY  05/31/2010   Dr. Benjamine Mola   Family History  Problem Relation Age of Onset  . Heart disease Father        questionable  . Stroke Mother   . Heart failure Sister   . Healthy Brother   . Healthy Sister   . Parkinsonism Brother   . Colon cancer Neg Hx    Social History   Tobacco Use  . Smoking status: Never Smoker  . Smokeless tobacco: Never Used  Substance Use Topics  . Alcohol use: Yes    Alcohol/week: 2.0 standard drinks    Types: 2 Glasses of wine per week    Comment: socially  . Drug use: No   Current Outpatient Medications  Medication Sig Dispense Refill  . acetaminophen (TYLENOL) 650 MG CR tablet Take 650 mg by mouth 2 (two) times daily.     Marland Kitchen allopurinol (ZYLOPRIM) 300 MG tablet Take 300 mg by mouth daily.    Marland Kitchen  AMBULATORY NON FORMULARY MEDICATION Medication Name: Hydrocortisone cream 1%: Apply to the rectum once a day for a week, then as needed    . bisoprolol (ZEBETA) 5 MG tablet Take 0.5 tablets (2.5 mg total) daily by mouth. 45 tablet 3  . cetirizine (ZYRTEC) 10 MG tablet Take 10 mg by mouth daily.      . Cholecalciferol (VITAMIN D) 2000 units CAPS Take 1-2 capsules by mouth daily.    . colchicine 0.6 MG tablet Take 0.6 mg by mouth daily as needed.    . dicyclomine (BENTYL) 10 MG capsule TAKE 1 CAPSULE BY MOUTH EVERY 8 HOURS AS NEEDED FOR SPASMS 90 capsule 1  . isosorbide mononitrate (IMDUR) 30 MG 24 hr tablet Take 15 mg by mouth daily.    Marland Kitchen LORazepam (ATIVAN) 1 MG tablet Take 0.25-0.5 mg by mouth as needed.     Marland Kitchen losartan (COZAAR) 25 MG tablet Take 1 tablet (25 mg total) by mouth  daily. 90 tablet 2  . magnesium oxide (MAG-OX) 400 MG tablet Take 400 mg by mouth daily at 12 noon.     . metolazone (ZAROXOLYN) 2.5 MG tablet Take 1 tablet (2.5 mg total) by mouth daily as needed (if weight 135 lbs or more). Take 40 meq potassium if you take this pill. 2 tablet 0  . mometasone (ASMANEX) 220 MCG/INH inhaler Inhale 1 puff into the lungs daily.     . montelukast (SINGULAIR) 10 MG tablet Take 1 tablet (10 mg total) by mouth at bedtime. PLEASE SCHEDULE APPOINTMENT. 30 tablet 0  . nitroGLYCERIN (NITROSTAT) 0.4 MG SL tablet Place 1 tablet (0.4 mg total) under the tongue every 5 (five) minutes as needed for chest pain. 25 tablet 1  . omeprazole (PRILOSEC) 20 MG capsule Take 20 mg by mouth every morning.    Vladimir Faster Glycol-Propyl Glycol (SYSTANE OP) Apply 2-3 drops to eye 3 (three) times daily as needed (dry eyes).    . potassium chloride (K-DUR,KLOR-CON) 10 MEQ tablet Take 0.5 tablets (5 mEq total) by mouth daily. 45 tablet 3  . silodosin (RAPAFLO) 8 MG CAPS capsule Take 8 mg by mouth every other day.    . simvastatin (ZOCOR) 40 MG tablet Take 1 tablet (40 mg total) by mouth at bedtime. 90 tablet 1  . spironolactone (ALDACTONE) 25 MG tablet TAKE ONE TABLET BY MOUTH ONCE DAILY 90 tablet 3  . torsemide (DEMADEX) 20 MG tablet Take 1 tablet (20 mg total) by mouth daily. 90 tablet 3  . warfarin (COUMADIN) 5 MG tablet TAKE 1/2 TO 1 (ONE-HALF TO ONE) TABLET BY MOUTH ONCE DAILY AS DIRECTED BY COUMADIN CLINIC 90 tablet 1   No current facility-administered medications for this visit.    Allergies  Allergen Reactions  . Amoxicillin Rash    Has patient had a PCN reaction causing immediate rash, facial/tongue/throat swelling, SOB or lightheadedness with hypotension: Yes Has patient had a PCN reaction causing severe rash involving mucus membranes or skin necrosis: No Has patient had a PCN reaction that required hospitalization: No Has patient had a PCN reaction occurring within the last 10  years: No If all of the above answers are "NO", then may proceed with Cephalosporin use.   Marland Kitchen Penicillins Rash    Has patient had a PCN reaction causing immediate rash, facial/tongue/throat swelling, SOB or lightheadedness with hypotension: Yes Has patient had a PCN reaction causing severe rash involving mucus membranes or skin necrosis: No Has patient had a PCN reaction that required hospitalization: No Has patient  had a PCN reaction occurring within the last 10 years: No If all of the above answers are "NO", then may proceed with Cephalosporin use.      Review of Systems: All systems reviewed and negative except where noted in HPI.   Lab Results  Component Value Date   WBC 9.8 09/19/2017   HGB 14.2 09/27/2017   HCT 40.0 09/19/2017   MCV 86 09/19/2017   PLT 267 09/19/2017    Lab Results  Component Value Date   CREATININE 0.89 07/11/2017   BUN 12 07/11/2017   NA 139 07/11/2017   K 3.5 07/11/2017   CL 100 (L) 07/11/2017   CO2 29 07/11/2017     Lab Results  Component Value Date   ALT 13 06/12/2016   AST 19 06/12/2016   ALKPHOS 79 06/12/2016   BILITOT 0.8 06/12/2016     Physical Exam: BP (!) 90/54   Pulse 88   Ht 5\' 7"  (1.702 m)   Wt 144 lb 12.8 oz (65.7 kg)   BMI 22.68 kg/m  Constitutional: Pleasant,well-developed, male in no acute distress. HEENT: Normocephalic and atraumatic. Conjunctivae are normal. No scleral icterus. Neck supple.  Cardiovascular: Normal rate, regular rhythm.  Pulmonary/chest: Effort normal and breath sounds normal. No wheezing, rales or rhonchi. Abdominal: Soft, nondistended, nontender.  There are no masses palpable. No hepatomegaly. Extremities: no edema Lymphadenopathy: No cervical adenopathy noted. Neurological: Alert and oriented to person place and time. Skin: Skin is warm and dry. No rashes noted. Psychiatric: Normal mood and affect. Behavior is normal.   ASSESSMENT AND PLAN: 77 year old male here for reassessment of the  following issues:  Internal hemorrhoids - history of CHF and AF on Coumadin, previously had ongoing painless bleeding suspected related to hemorrhoids. He has declined colonoscopy and multiple occasions, see prior notes for details. We banded the LL hemorrhoid on 10/04/17 - since that time no further hemorrhoid symptoms which are bothering him. Since I have last seen him I have spoken with [employer] who makes the banding equipment, Dr. Abbe Amsterdam, about banding on anticoagulation. While there is not a lot of data in doing this, in their experience the risk of bleeding remains quite low with banding while on anticoagulation and they generally continue anticoagulation for higher risk patients in this situation. I discussed this with the patient. As he is having no further bleeding at this time, will hold off on further elective therapy. If he notes recurrent bleeding in the future, which is possible, he can follow-up with me as needed for additional banding. Given he is a higher risk patient I think banding while on Coumadin is probably the best way to approach his case, as the risk of bleeding despite Coumadin remains low. He was agreeable to this, he will follow-up as needed for this issue.  IBS - long-standing symptoms. We discussed options for gas and bloating. I found some samples of IB gard which were provided to him, if this helps he can purchase it OTC and use it PRN. I provided him guidelines for a low FODMAP diet and recommend he try that. Otherwise can try simethicone or beano as well if other options fail. As above he has declined colonoscopy. He can follow up as needed.  Lincoln Cellar, MD [facility]

## 2017-11-07 DIAGNOSIS — H353211 Exudative age-related macular degeneration, right eye, with active choroidal neovascularization: Secondary | ICD-10-CM | POA: Diagnosis not present

## 2017-11-07 DIAGNOSIS — H35362 Drusen (degenerative) of macula, left eye: Secondary | ICD-10-CM | POA: Diagnosis not present

## 2017-11-07 DIAGNOSIS — H353122 Nonexudative age-related macular degeneration, left eye, intermediate dry stage: Secondary | ICD-10-CM | POA: Diagnosis not present

## 2017-11-12 ENCOUNTER — Other Ambulatory Visit (HOSPITAL_COMMUNITY): Payer: Self-pay | Admitting: Internal Medicine

## 2017-11-14 DIAGNOSIS — M25561 Pain in right knee: Secondary | ICD-10-CM | POA: Diagnosis not present

## 2017-11-14 DIAGNOSIS — I509 Heart failure, unspecified: Secondary | ICD-10-CM | POA: Diagnosis not present

## 2017-11-14 DIAGNOSIS — Z79899 Other long term (current) drug therapy: Secondary | ICD-10-CM | POA: Diagnosis not present

## 2017-11-14 DIAGNOSIS — M1120 Other chondrocalcinosis, unspecified site: Secondary | ICD-10-CM | POA: Diagnosis not present

## 2017-11-14 DIAGNOSIS — M109 Gout, unspecified: Secondary | ICD-10-CM | POA: Diagnosis not present

## 2017-11-14 DIAGNOSIS — M1990 Unspecified osteoarthritis, unspecified site: Secondary | ICD-10-CM | POA: Diagnosis not present

## 2017-11-21 ENCOUNTER — Ambulatory Visit (INDEPENDENT_AMBULATORY_CARE_PROVIDER_SITE_OTHER): Payer: Medicare Other

## 2017-11-21 DIAGNOSIS — Z95810 Presence of automatic (implantable) cardiac defibrillator: Secondary | ICD-10-CM

## 2017-11-21 DIAGNOSIS — I509 Heart failure, unspecified: Secondary | ICD-10-CM | POA: Diagnosis not present

## 2017-11-21 NOTE — Progress Notes (Signed)
EPIC Encounter for ICM Monitoring  Patient Name: [name] is a 77 y.o. male Date: 11/21/2017 Primary Care Physican: [name], MD Primary Cardiologist:Kelly/Bensimhon Electrophysiologist: [name] Dry Weight:140-141lbs BiV Pacing: 95.6%      Heart Failure questions reviewed, pt asymptomatic.   Thoracic impedance normal.   Prescribed: Torsemide 20 mg 1 tabletdaily.  Potassium 10 mEq take 0.5 tablet daily.  Labs: 07/11/2017 Creatinine 0.89, BUN 12, Potassium 3.5, Sodium 139, EGFR >60 01/01/2017 Creatinine0.79, BUN13, Potassium4.5, Sodium141, [id number] 09/22/2016 Creatinine0.84, BUN13, Potassium3.8, Sodium135, EGFR>60  09/20/2016 Creatinine0.90, BUN19, Potassium4.1, Sodium138, [id number]  08/06/2018Creatinine 0.98, BUN12, Potassium3.4, Sodium137, [id number]  05/15/2018Creatinine 0.86, BUN14, Potassium3.9, Sodium140  02/19/2018Creatinine 1.13, BUN16, Potassium3.4, Sodium135, EGFR>60 A complete set of results can be found in Results Review  Recommendations: No changes.    Encouraged to call for fluid symptoms.  Follow-up plan: ICM clinic phone appointment on 12/23/2017.   Office appointment scheduled 01/07/2018 with Dr. [name].    Copy of ICM check sent to Dr. [name].   3 month ICM trend: 11/21/2017    1 Year ICM trend:       [name], RN 11/21/2017 12:09 PM

## 2017-11-22 ENCOUNTER — Telehealth: Payer: Self-pay | Admitting: Gastroenterology

## 2017-11-22 NOTE — Telephone Encounter (Signed)
Thanks very much Sheri for taking his call. I agree with your recommendations.

## 2017-11-22 NOTE — Progress Notes (Signed)
Returned patient call as requested by voice mail message.  He reported he has been having some stomach bloating but was not sure if it could be related to fluid.  Advised stomach bloating can be a fluid accumulation symptom.  He said this has been going on for awhile now but he thinks it may be GI.  He called GI office today and was advised to take over the counter gas-x.  He will try that to see if he has some relief.

## 2017-11-22 NOTE — Telephone Encounter (Signed)
Patient reports that he is having excess bloating and at times makes it difficult to breathe, especially at night.  He is advised to try IBgard.  He reports that it gives him heartburn.  He will now try gas -x or phazyme with his meals.  We discussed that he should follow the low gas diet that I am sending him in the mail. He is encouraged to eat smaller more frequent meals and take a walk after a large meal.  He will try an food elimination diet and a food diary to see if he can figure out his bloating triggers.  He has regular bowel movements and is not constipated.

## 2017-11-28 DIAGNOSIS — J3089 Other allergic rhinitis: Secondary | ICD-10-CM | POA: Diagnosis not present

## 2017-11-28 DIAGNOSIS — J3081 Allergic rhinitis due to animal (cat) (dog) hair and dander: Secondary | ICD-10-CM | POA: Diagnosis not present

## 2017-11-28 DIAGNOSIS — J301 Allergic rhinitis due to pollen: Secondary | ICD-10-CM | POA: Diagnosis not present

## 2017-11-28 DIAGNOSIS — J4540 Moderate persistent asthma, uncomplicated: Secondary | ICD-10-CM | POA: Diagnosis not present

## 2017-11-29 ENCOUNTER — Ambulatory Visit (INDEPENDENT_AMBULATORY_CARE_PROVIDER_SITE_OTHER): Payer: Medicare Other | Admitting: Pharmacist Clinician (PhC)/ Clinical Pharmacy Specialist

## 2017-11-29 DIAGNOSIS — Z5181 Encounter for therapeutic drug level monitoring: Secondary | ICD-10-CM

## 2017-11-29 DIAGNOSIS — Z7901 Long term (current) use of anticoagulants: Secondary | ICD-10-CM | POA: Diagnosis not present

## 2017-11-29 DIAGNOSIS — I480 Paroxysmal atrial fibrillation: Secondary | ICD-10-CM | POA: Diagnosis not present

## 2017-11-29 DIAGNOSIS — I4891 Unspecified atrial fibrillation: Secondary | ICD-10-CM

## 2017-11-29 LAB — POCT INR: INR: 2.9 (ref 2.0–3.0)

## 2017-11-30 ENCOUNTER — Other Ambulatory Visit (HOSPITAL_COMMUNITY): Payer: Self-pay | Admitting: Internal Medicine

## 2017-12-12 DIAGNOSIS — R972 Elevated prostate specific antigen [PSA]: Secondary | ICD-10-CM | POA: Diagnosis not present

## 2017-12-17 ENCOUNTER — Other Ambulatory Visit (HOSPITAL_COMMUNITY): Payer: Self-pay | Admitting: Cardiology

## 2017-12-23 ENCOUNTER — Ambulatory Visit (INDEPENDENT_AMBULATORY_CARE_PROVIDER_SITE_OTHER): Payer: Medicare Other

## 2017-12-23 ENCOUNTER — Encounter: Payer: Self-pay | Admitting: Gastroenterology

## 2017-12-23 ENCOUNTER — Other Ambulatory Visit (INDEPENDENT_AMBULATORY_CARE_PROVIDER_SITE_OTHER): Payer: Medicare Other

## 2017-12-23 ENCOUNTER — Ambulatory Visit (INDEPENDENT_AMBULATORY_CARE_PROVIDER_SITE_OTHER): Payer: Medicare Other | Admitting: Gastroenterology

## 2017-12-23 VITALS — BP 120/68 | HR 62 | Ht 65.5 in | Wt 145.0 lb

## 2017-12-23 DIAGNOSIS — I509 Heart failure, unspecified: Secondary | ICD-10-CM

## 2017-12-23 DIAGNOSIS — K649 Unspecified hemorrhoids: Secondary | ICD-10-CM

## 2017-12-23 DIAGNOSIS — Z95810 Presence of automatic (implantable) cardiac defibrillator: Secondary | ICD-10-CM | POA: Diagnosis not present

## 2017-12-23 DIAGNOSIS — R1010 Upper abdominal pain, unspecified: Secondary | ICD-10-CM

## 2017-12-23 DIAGNOSIS — I255 Ischemic cardiomyopathy: Secondary | ICD-10-CM | POA: Diagnosis not present

## 2017-12-23 LAB — CREATININE, SERUM: CREATININE: 0.91 mg/dL (ref 0.40–1.50)

## 2017-12-23 LAB — BUN: BUN: 17 mg/dL (ref 6–23)

## 2017-12-23 NOTE — Progress Notes (Signed)
HPI :  77 year old male with a history of coronary artery disease and NSVT, history of CHF with EF 20%, history of rectal bleeding presumed due to hemorrhoids in the setting of anticoagulation, here for a follow up visit.   He has declined colonoscopy in the past, has had screening with colo-guard in both 2015 and 2018 which were both negative. He has no family history of colon cancer. We had discussed colonoscopy in light of his bleeding symptoms in the past. He has been avoiding endoscopic evaluations given his cardiac history. He's never had a prior colonoscopy. We banded the LL hemorrhoid on 10/04/17, this significant reduced his bleeding and essentially stopped it. He had a very small amount of blood passed a few weeks ago, nothing since that time. He continues to declined colonoscopy.   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 He's had intermittent gas and bloating with abdominal cramps. He reports symptoms have changed since the last seen him. He endorses upper abdominal discomfort to the right upper quadrant through upper mid abdomen and left upper quadrant. This is been coming and going intermittently. He denies any clear triggers for this. He seems to be eating okay, no nausea or vomiting. He is on omeprazole 20 mg once a day for reflux and it appears well-controlled. He takes Bentyl for his typical IBS symptoms which has not helped. His pain is rated 5-6 out of 10 and will last for hours to days at a time. He denies any life stressors that are bothering him. His bowels are fairly regular, he has a bowel movement once a day every morning. He previously had an ultrasound of his right upper quadrant in May 2017 which showed distended gallbladder with some wall thickening at the time without cholelithiasis. He had a follow-up HIDA scan that day which showed no evidence of acute cholecystitis, thickening perhaps thought to be due to congestive heart failure at the time. He had an upper GI series in  August of this year which showed some minimal nonspecific esophageal dysmotility with some mild reflux, and a small duodenal diverticulum, otherwise normal. He has no history of renal disease. He's had a remote CT scan of the abdomen in 2004 without any acute concerning changes. No weight loss.    Past Medical History:  Diagnosis Date  . Acute on chronic systolic CHF (congestive heart failure) ([place]) 06/09/2015  . 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 Cardiomyopathy, ischemic 2011   with EF 25-35% by echo  . Coronary artery disease    Hx MI 1992, CABG 1998 , Nuc study 11.2013 large scar but no ischemia  . GERD (gastroesophageal reflux disease)   . H. pylori infection    Hx of   . 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 NSVT (nonsustained ventricular tachycardia) ([place])   . Paroxysmal atrial fibrillation [facility])      Past Surgical History:  Procedure Laterality Date  . BIV UPGRADE N/A 09/10/2016   Procedure: BiV ICD Upgrade;  Surgeon: Deboraha Sprang, MD;  Location: [facility] [facility];  Service: Cardiovascular;  Laterality: N/A;  . CATARACT EXTRACTION, BILATERAL    . CORONARY ARTERY BYPASS GRAFT  1998   x 5  . HERNIA REPAIR  1960's   right inguinal  . ICD GENERATOR CHANGE  01/2008, 2018   medtronic, hx+ EP study  . KNEE SURGERY     right  .  NASAL SINUS SURGERY  05/31/2010   Dr. Benjamine Mola   Family History  Problem Relation Age of Onset  . Heart disease Father        questionable  . Stroke Mother   . Heart failure Sister   . Healthy Brother   . Healthy Sister   . Parkinsonism Brother   . Colon cancer Neg Hx    Social History   Tobacco Use  . Smoking status: Never Smoker  . Smokeless tobacco: Never Used  Substance Use Topics  . Alcohol use: Yes    Alcohol/week: 2.0 standard drinks    Types: 2 Glasses of wine per week    Comment: socially  . Drug use: No   Current Outpatient Medications    Medication Sig Dispense Refill  . acetaminophen (TYLENOL) 650 MG CR tablet Take 650 mg by mouth 2 (two) times daily.     Marland Kitchen allopurinol (ZYLOPRIM) 300 MG tablet Take 300 mg by mouth daily.    . AMBULATORY NON FORMULARY MEDICATION Medication Name: Hydrocortisone cream 1%: Apply to the rectum once a day for a week, then as needed    . AMBULATORY NON FORMULARY MEDICATION IBgard- Take as directed 16 capsule 0  . bisoprolol (ZEBETA) 5 MG tablet TAKE 1/2 (ONE-HALF) TABLET BY MOUTH ONCE DAILY 45 tablet 1  . cetirizine (ZYRTEC) 10 MG tablet Take 10 mg by mouth daily.      . Cholecalciferol (VITAMIN D) 2000 units CAPS Take 1-2 capsules by mouth daily.    . colchicine 0.6 MG tablet Take 0.6 mg by mouth daily as needed.    . dicyclomine (BENTYL) 10 MG capsule TAKE 1 CAPSULE BY MOUTH EVERY 8 HOURS AS NEEDED FOR SPASMS 90 capsule 1  . isosorbide mononitrate (IMDUR) 30 MG 24 hr tablet Take 15 mg by mouth daily.    Marland Kitchen LORazepam (ATIVAN) 1 MG tablet Take 0.25-0.5 mg by mouth as needed.     Marland Kitchen losartan (COZAAR) 25 MG tablet TAKE 1 TABLET BY MOUTH TWICE DAILY 60 tablet 3  . magnesium oxide (MAG-OX) 400 MG tablet Take 400 mg by mouth daily at 12 noon.     . mometasone (ASMANEX) 220 MCG/INH inhaler Inhale 1 puff into the lungs daily.     . montelukast (SINGULAIR) 10 MG tablet Take 1 tablet (10 mg total) by mouth at bedtime. PLEASE SCHEDULE APPOINTMENT. 30 tablet 0  . nitroGLYCERIN (NITROSTAT) 0.4 MG SL tablet Place 1 tablet (0.4 mg total) under the tongue every 5 (five) minutes as needed for chest pain. 25 tablet 1  . omeprazole (PRILOSEC) 20 MG capsule Take 20 mg by mouth every morning.    Vladimir Faster Glycol-Propyl Glycol (SYSTANE OP) Apply 2-3 drops to eye 3 (three) times daily as needed (dry eyes).    . potassium chloride (K-DUR,KLOR-CON) 10 MEQ tablet Take 0.5 tablets (5 mEq total) by mouth daily. 45 tablet 3  . silodosin (RAPAFLO) 8 MG CAPS capsule Take 8 mg by mouth every other day.    . simvastatin (ZOCOR) 40  MG tablet Take 1 tablet (40 mg total) by mouth at bedtime. 90 tablet 1  . spironolactone (ALDACTONE) 25 MG tablet TAKE ONE TABLET BY MOUTH ONCE DAILY 90 tablet 3  . torsemide (DEMADEX) 20 MG tablet TAKE 1 TABLET BY MOUTH ONCE DAILY 90 tablet 1  . warfarin (COUMADIN) 5 MG tablet TAKE 1/2 TO 1 (ONE-HALF TO ONE) TABLET BY MOUTH ONCE DAILY AS DIRECTED BY COUMADIN CLINIC 90 tablet 1   No current facility-administered  medications for this visit.    Allergies  Allergen Reactions  . Amoxicillin Rash    Has patient had a PCN reaction causing immediate rash, facial/tongue/throat swelling, SOB or lightheadedness with hypotension: Yes Has patient had a PCN reaction causing severe rash involving mucus membranes or skin necrosis: No Has patient had a PCN reaction that required hospitalization: No Has patient had a PCN reaction occurring within the last 10 years: No If all of the above answers are "NO", then may proceed with Cephalosporin use.   Marland Kitchen Penicillins Rash    Has patient had a PCN reaction causing immediate rash, facial/tongue/throat swelling, SOB or lightheadedness with hypotension: Yes Has patient had a PCN reaction causing severe rash involving mucus membranes or skin necrosis: No Has patient had a PCN reaction that required hospitalization: No Has patient had a PCN reaction occurring within the last 10 years: No If all of the above answers are "NO", then may proceed with Cephalosporin use.      Review of Systems: All systems reviewed and negative except where noted in HPI.   Lab Results  Component Value Date   WBC 9.8 09/19/2017   HGB 14.2 09/27/2017   HCT 40.0 09/19/2017   MCV 86 09/19/2017   PLT 267 09/19/2017    Lab Results  Component Value Date   CREATININE 0.89 07/11/2017   BUN 12 07/11/2017   NA 139 07/11/2017   K 3.5 07/11/2017   CL 100 (L) 07/11/2017   CO2 29 07/11/2017    Lab Results  Component Value Date   ALT 13 06/12/2016   AST 19 06/12/2016   ALKPHOS 79  06/12/2016   BILITOT 0.8 06/12/2016     Physical Exam: BP 120/68   Pulse 62   Ht 5' 5.5" (1.664 m)   Wt 145 lb (65.8 kg)   BMI 23.76 kg/m  Constitutional: Pleasant,well-developed, male in no acute distress. HEENT: Normocephalic and atraumatic. Conjunctivae are normal. No scleral icterus. Neck supple.  Cardiovascular: Normal rate, regular rhythm.  Pulmonary/chest: Effort normal and breath sounds normal. No wheezing, rales or rhonchi. Abdominal: Soft, nondistended, nontender. . There are no masses palpable. No hepatomegaly. Extremities: no edema Lymphadenopathy: No cervical adenopathy noted. Neurological: Alert and oriented to person place and time. Skin: Skin is warm and dry. No rashes noted. Psychiatric: Normal mood and affect. Behavior is normal.   ASSESSMENT AND PLAN: 77 year old male here for reassessment of following issues:  Abdominal pain - as outlined above, no clear triggers, does not appear related to bowel habit changes, seems atypical for his IBS symptoms. Previously had a negative HIDA scan as above. I offered him a CT scan of the abdomen and pelvis with contrast to further evaluate, ensure his pancreas is normal and rule out any concerning pathology given he reports this is worsening over time and different from his normal symptoms. If this is normal a likely has functional symptoms. He has previously been on a low FODMAP diet, Bentyl, and taking his omeprazole. If these measures have helped him at all he can continue in the interim. I will contact him with results of testing.  Hemorrhoids - significant improvement since banding x 1. He had one small episode of bleeding since then and it resolved. As above, he declines colonoscopy, it was recommended to ensure no other pathology in his colon. If symptoms recur or persist he will contact me. Will hold off on additional banding for now otherwise if he remains asymptomatic.  [place] Cellar, MD [facility]

## 2017-12-23 NOTE — Patient Instructions (Signed)
If you are age 78 or older, your body mass index should be between 23-30. Your Body mass index is 23.76 kg/m. If this is out of the aforementioned range listed, please consider follow up with your Primary Care Provider.  If you are age 50 or younger, your body mass index should be between 19-25. Your Body mass index is 23.76 kg/m. If this is out of the aformentioned range listed, please consider follow up with your Primary Care Provider.   You have been scheduled for a CT scan of the abdomen and pelvis at [facility] ([address]---this is in the same building as Press photographer).   You are scheduled on Wednesday, 01-01-18 at 9:45am. You should arrive 15 minutes prior to your appointment time for registration. Please follow the written instructions below on the day of your exam:  WARNING: IF YOU ARE ALLERGIC TO IODINE/X-RAY DYE, PLEASE NOTIFY RADIOLOGY IMMEDIATELY AT [phone]! YOU WILL BE GIVEN A 13 HOUR PREMEDICATION PREP.  1) Do not eat anything after 5:45am (4 hours prior to your test) 2) You have been given 2 bottles of oral contrast to drink. The solution may taste better if refrigerated, but do NOT add ice or any other liquid to this solution. Shake well before drinking.    Drink 1 bottle of contrast @ 7:45am (2 hours prior to your exam)  Drink 1 bottle of contrast @ 8:45am (1 hour prior to your exam)  You may take any medications as prescribed with a small amount of water, if necessary. If you take any of the following medications: METFORMIN, GLUCOPHAGE, GLUCOVANCE, AVANDAMET, RIOMET, FORTAMET, [place] MET, JANUMET, GLUMETZA or METAGLIP, you MAY be asked to HOLD this medication 48 hours AFTER the exam.  The purpose of you drinking the oral contrast is to aid in the visualization of your intestinal tract. The contrast solution may cause some diarrhea. Depending on your individual set of symptoms, you may also receive an intravenous injection of x-ray contrast/dye.  Plan on being at [facility] for 30 minutes or longer, depending on the type of exam you are having performed.  This test typically takes 30-45 minutes to complete.  If you have any questions regarding your exam or if you need to reschedule, you may call the CT department at [phone] between the hours of 8:00 am and 5:00 pm, Monday-Friday.  ________________________________________________________________  Please go to the lab in the basement of our building to have lab work done as you leave today. Hit "B" for basement when you get on the elevator.  When the doors open the lab is on your left.  We will call you with the results. Thank you.  Thank you for entrusting me with your care and for choosing [facility], Dr. Okarche Cellar

## 2017-12-23 NOTE — Progress Notes (Signed)
EPIC Encounter for ICM Monitoring  Patient Name: [name] is a 77 y.o. male Date: 12/23/2017 Primary Care Physican: [name], MD Primary Cardiologist:Kelly/Bensimhon Electrophysiologist: [name] BiV Pacing: 93.6%  Last Weight:140-141lbs Today's Weight: 139 lbs       Heart Failure questions reviewed, pt asymptomatic.   Thoracic impedance trending slightly below baseline suggesting fluid accumulation.  Prescribed:  Torsemide 20 mg 1 tabletdaily. Potassium 10 mEq take 0.5 tablet daily.  Labs: 07/11/2017 Creatinine 0.89, BUN 12, Potassium 3.5, Sodium 139, EGFR >60 01/01/2017 Creatinine0.79, BUN13, Potassium4.5, Sodium141, [id number] 09/22/2016 Creatinine0.84, BUN13, Potassium3.8, Sodium135, EGFR>60  09/20/2016 Creatinine0.90, BUN19, Potassium4.1, Sodium138, [id number]  08/06/2018Creatinine 0.98, BUN12, Potassium3.4, Sodium137, [id number]  05/15/2018Creatinine 0.86, BUN14, Potassium3.9, Sodium140  02/19/2018Creatinine 1.13, BUN16, Potassium3.4, Sodium135, EGFR>60 A complete set of results can be found in Results Review  Recommendations:  No changes.  Encouraged to call for any fluid symptoms.  Follow-up plan: ICM clinic phone appointment on 01/23/2018.     Copy of ICM check sent to Dr. [name] and Dr [name].   3 month ICM trend: 12/23/2017    1 Year ICM trend:       [name], RN 12/23/2017 9:12 AM

## 2017-12-27 DIAGNOSIS — J069 Acute upper respiratory infection, unspecified: Secondary | ICD-10-CM | POA: Diagnosis not present

## 2017-12-30 ENCOUNTER — Other Ambulatory Visit: Payer: Self-pay | Admitting: Family Medicine

## 2017-12-30 ENCOUNTER — Ambulatory Visit
Admission: RE | Admit: 2017-12-30 | Discharge: 2017-12-30 | Disposition: A | Discharge status: Home / Self Care | Payer: Medicare Other | Source: Ambulatory Visit | Attending: Family Medicine | Admitting: Family Medicine

## 2017-12-30 DIAGNOSIS — J209 Acute bronchitis, unspecified: Secondary | ICD-10-CM | POA: Diagnosis not present

## 2017-12-30 DIAGNOSIS — R05 Cough: Secondary | ICD-10-CM | POA: Diagnosis not present

## 2018-01-01 ENCOUNTER — Inpatient Hospital Stay: Admission: RE | Admit: 2018-01-01 | Payer: Medicare Other | Source: Ambulatory Visit

## 2018-01-07 ENCOUNTER — Ambulatory Visit (HOSPITAL_COMMUNITY)
Admission: RE | Admit: 2018-01-07 | Discharge: 2018-01-07 | Disposition: A | Discharge status: Home / Self Care | Payer: Medicare Other | Source: Ambulatory Visit | Attending: Internal Medicine | Admitting: Internal Medicine

## 2018-01-07 ENCOUNTER — Encounter (HOSPITAL_COMMUNITY): Payer: Self-pay | Admitting: Internal Medicine

## 2018-01-07 VITALS — BP 110/60 | HR 64 | Wt 145.6 lb

## 2018-01-07 DIAGNOSIS — Z880 Allergy status to penicillin: Secondary | ICD-10-CM | POA: Insufficient documentation

## 2018-01-07 DIAGNOSIS — I447 Left bundle-branch block, unspecified: Secondary | ICD-10-CM | POA: Insufficient documentation

## 2018-01-07 DIAGNOSIS — I5022 Chronic systolic (congestive) heart failure: Secondary | ICD-10-CM

## 2018-01-07 DIAGNOSIS — Z79899 Other long term (current) drug therapy: Secondary | ICD-10-CM | POA: Diagnosis not present

## 2018-01-07 DIAGNOSIS — Z7901 Long term (current) use of anticoagulants: Secondary | ICD-10-CM | POA: Diagnosis not present

## 2018-01-07 DIAGNOSIS — I252 Old myocardial infarction: Secondary | ICD-10-CM | POA: Diagnosis not present

## 2018-01-07 DIAGNOSIS — Z823 Family history of stroke: Secondary | ICD-10-CM | POA: Diagnosis not present

## 2018-01-07 DIAGNOSIS — I2510 Atherosclerotic heart disease of native coronary artery without angina pectoris: Secondary | ICD-10-CM

## 2018-01-07 DIAGNOSIS — I480 Paroxysmal atrial fibrillation: Secondary | ICD-10-CM

## 2018-01-07 DIAGNOSIS — Z8249 Family history of ischemic heart disease and other diseases of the circulatory system: Secondary | ICD-10-CM | POA: Insufficient documentation

## 2018-01-07 DIAGNOSIS — R21 Rash and other nonspecific skin eruption: Secondary | ICD-10-CM | POA: Insufficient documentation

## 2018-01-07 LAB — BASIC METABOLIC PANEL
Anion gap: 11 (ref 5–15)
BUN: 12 mg/dL (ref 8–23)
CO2: 26 mmol/L (ref 22–32)
Calcium: 9.4 mg/dL (ref 8.9–10.3)
Chloride: 99 mmol/L (ref 98–111)
Creatinine, Ser: 0.88 mg/dL (ref 0.61–1.24)
GFR calc Af Amer: >60 mL/min (ref >60)
GFR calc non Af Amer: >60 mL/min (ref >60)
Glucose, Bld: 90 mg/dL (ref 70–99)
Potassium: 4.1 mmol/L (ref 3.5–5.1)
Sodium: 136 mmol/L (ref 135–145)

## 2018-01-07 LAB — PROTIME-INR
INR: 2.79
Prothrombin Time: 29 seconds — ABNORMAL HIGH (ref 11.4–15.2)

## 2018-01-07 LAB — BRAIN NATRIURETIC PEPTIDE: B Natriuretic Peptide: 562.8 pg/mL — ABNORMAL HIGH (ref 0.0–100.0)

## 2018-01-07 MED ORDER — LOSARTAN POTASSIUM 25 MG PO TABS: 25.0000 mg | ORAL_TABLET | Freq: Two times a day (BID) | ORAL | 3 refills | Status: DC

## 2018-01-07 NOTE — Addendum Note (Signed)
Encounter addended by: Jolaine Artist, MD on: 01/07/2018 11:21 AM  Actions taken: Charge Capture section accepted

## 2018-01-07 NOTE — Addendum Note (Signed)
Encounter addended by: Valeda Malm, RN on: 01/07/2018 11:15 AM  Actions taken: Diagnosis association updated, Order list changed, Order Reconciliation Section accessed

## 2018-01-07 NOTE — Addendum Note (Signed)
Encounter addended by: Valeda Malm, RN on: 01/07/2018 11:20 AM  Actions taken: Order Reconciliation Section accessed, Home Medications modified, Medication List reviewed

## 2018-01-07 NOTE — Patient Instructions (Signed)
Labs today We will only contact you if something comes back abnormal or we need to make some changes. Otherwise no news is good news!  Your physician recommends that you schedule a follow-up appointment in: 4 months. PLEASE CALL IN February to schedule your April appointment.    Do the following things EVERYDAY: 1) Weigh yourself in the morning before breakfast. Write it down and keep it in a log. 2) Take your medicines as prescribed 3) Eat low salt foods-Limit salt (sodium) to 2000 mg per day.  4) Stay as active as you can everyday 5) Limit all fluids for the day to less than 2 liters

## 2018-01-07 NOTE — Addendum Note (Signed)
Encounter addended by: Jolaine Artist, MD on: 01/07/2018 1:10 PM  Actions taken: Vitals modified, Sign clinical note

## 2018-01-07 NOTE — Progress Notes (Addendum)
Patient ID: CRIT OBREMSKI, male   DOB: 03-17-1940, 77 y.o.   MRN: [id number]    Advanced Heart Failure Clinic Note   Date:  01/07/2018   ID:  Pistol, Kessenich 01-13-1941, MRN [id number]  PCP:  [name], MD  Primary Cardiologist:  [name] Referring: Dr. [name]   History of Present Illness: [name] is a 77 y.o. 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 In September 1998 he underwent CABG revascularization surgery after an unsuccessful attempt at stenting of his proximal LAD by [name]. Ejection fraction was 25-30%. In 2002 he underwent initial ICD implantation for nonsustained ventricular tachycardia documented on event monitor for primary prevention. In January 2010 he underwent generator change with a Medtronic Virtuoso single chamber cardioverter defibrillator.  A nuclear perfusion study in November 2013 showed a large area of scar in the LAD territory (extent 44%) involving the mid to apical anterior,apical and infero-apical to mid infero-septal and apical lateral wall without associated ischemia.  He is on Coumadin anticoagulation for PAF. He was readmitted to the hospital from a May 2 through 06/04/2014 with recurrent atrial fibrillation and started on Tikosyn. He was enrolled in the genetic AF trial (bucindolol vs Toprol). He did poorly in the trial in the setting of titration of beta-blocker. He was referred to HF Clinic. Genetic AF study completed for pt. He is now on bisoprol 2.5 mg daily.   He has been discussing CRT upgrade with Dr. 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    He presents today for follow up. Overall feeling good. Gets at least 7000 steps a day. Recently struggling with bronchitis. No fevers or chills. No CP, orthopnea or PND. Weight at home stable 140-142. BPs  have been stable. No dizziness. Taking all medications as directed.   Optivol interrogated: No VT/AF. 100% v-pacing. Fluid up in November but now back to baseline.   Echo 04/04/17 reviewed by Dr [name]. EF 20% with mild to mod reduction RV function.   CPX 02/17/16 Resting HR: 73 Peak HR: 108  (75% age predicted max HR) BP rest: 110/58 BP peak: 150/56 Peak VO2: 14.4 (58% predicted peak VO2) VE/VCO2 slope: 33 OUES: 1.32 Peak RER: 1.10 PETCO2 at peak: 33 O2pulse: 9  (82% predicted O2pulse)  Echo 12/17 EF 15-20%, Moderate MR, RV with reduced function Echo 5/17 EF 20% RV mildly down.  Echo 3/19: EF 20%, RV mild to moderate reduced function    Labs 10/04/15 K 5.3, Creatinine 0.85, BUN 15  Past Medical History:  Diagnosis Date  . Acute on chronic systolic CHF (congestive heart failure) ([place]) 06/09/2015  . 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 Cardiomyopathy, ischemic 2011   with EF 25-35% by echo  . Coronary artery disease    Hx MI 1992, CABG 1998 , Nuc study 11.2013 large scar but no ischemia  . GERD (gastroesophageal reflux disease)   . H. pylori infection    Hx of   . 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 NSVT (nonsustained ventricular tachycardia) ([place])   . Paroxysmal atrial fibrillation ([facility])     Current Outpatient Medications  Medication Sig Dispense Refill  . acetaminophen (TYLENOL) 650 MG CR tablet Take 650 mg by mouth 2 (two)  times daily.     Marland Kitchen allopurinol (ZYLOPRIM) 300 MG tablet Take 300 mg by mouth daily.    . AMBULATORY NON FORMULARY MEDICATION IBgard- Take as directed 16 capsule 0  . bisoprolol (ZEBETA) 5 MG tablet TAKE 1/2 (ONE-HALF) TABLET BY MOUTH ONCE DAILY 45 tablet 1  . cetirizine (ZYRTEC) 10 MG tablet Take 10 mg by mouth daily.      . Cholecalciferol (VITAMIN D) 2000 units CAPS Take 1-2 capsules by mouth daily.    . colchicine 0.6 MG tablet Take 0.6 mg by mouth  daily as needed.    . dicyclomine (BENTYL) 10 MG capsule TAKE 1 CAPSULE BY MOUTH EVERY 8 HOURS AS NEEDED FOR SPASMS 90 capsule 1  . esomeprazole (NEXIUM) 40 MG capsule Take by mouth.    . isosorbide mononitrate (IMDUR) 30 MG 24 hr tablet Take 15 mg by mouth daily.    Marland Kitchen LORazepam (ATIVAN) 1 MG tablet Take 0.25-0.5 mg by mouth as needed.     Marland Kitchen losartan (COZAAR) 25 MG tablet Take 1 tablet (25 mg total) by mouth 2 (two) times daily. 180 tablet 3  . losartan (COZAAR) 25 MG tablet Take 25 mg by mouth 2 (two) times daily.    . magnesium oxide (MAG-OX) 400 MG tablet Take 400 mg by mouth daily at 12 noon.     . mometasone (ASMANEX) 220 MCG/INH inhaler Inhale 1 puff into the lungs daily.     . montelukast (SINGULAIR) 10 MG tablet Take 1 tablet (10 mg total) by mouth at bedtime. PLEASE SCHEDULE APPOINTMENT. 30 tablet 0  . Omega-3 1000 MG CAPS Take by mouth.    Marland Kitchen omeprazole (PRILOSEC) 20 MG capsule Take 20 mg by mouth every morning.    Vladimir Faster Glycol-Propyl Glycol (SYSTANE OP) Apply 2-3 drops to eye 3 (three) times daily as needed (dry eyes).    . potassium chloride (K-DUR,KLOR-CON) 10 MEQ tablet Take 0.5 tablets (5 mEq total) by mouth daily. 45 tablet 3  . silodosin (RAPAFLO) 8 MG CAPS capsule Take 8 mg by mouth every other day.    . simvastatin (ZOCOR) 40 MG tablet Take 1 tablet (40 mg total) by mouth at bedtime. 90 tablet 1  . spironolactone (ALDACTONE) 25 MG tablet TAKE ONE TABLET BY MOUTH ONCE DAILY 90 tablet 3  . torsemide (DEMADEX) 20 MG tablet TAKE 1 TABLET BY MOUTH ONCE DAILY 90 tablet 1  . warfarin (COUMADIN) 5 MG tablet TAKE 1/2 TO 1 (ONE-HALF TO ONE) TABLET BY MOUTH ONCE DAILY AS DIRECTED BY COUMADIN CLINIC 90 tablet 1  . AMBULATORY NON FORMULARY MEDICATION Medication Name: Hydrocortisone cream 1%: Apply to the rectum once a day for a week, then as needed (Patient not taking: Reported on 01/07/2018)    . mometasone (ASMANEX) 220 MCG/INH inhaler Inhale into the lungs.    . nitroGLYCERIN  (NITROSTAT) 0.4 MG SL tablet Place 1 tablet (0.4 mg total) under the tongue every 5 (five) minutes as needed for chest pain. (Patient not taking: Reported on 01/07/2018) 25 tablet 1   No current facility-administered medications for this encounter.     Allergies:    Allergies  Allergen Reactions  . Amoxicillin Rash    Has patient had a PCN reaction causing immediate rash, facial/tongue/throat swelling, SOB or lightheadedness with hypotension: Yes Has patient had a PCN reaction causing severe rash involving mucus membranes or skin necrosis: No Has patient had a PCN reaction that required hospitalization: No Has patient had a PCN reaction occurring within the last  10 years: No If all of the above answers are "NO", then may proceed with Cephalosporin use.   Marland Kitchen Penicillins Rash    Has patient had a PCN reaction causing immediate rash, facial/tongue/throat swelling, SOB or lightheadedness with hypotension: Yes Has patient had a PCN reaction causing severe rash involving mucus membranes or skin necrosis: No Has patient had a PCN reaction that required hospitalization: No Has patient had a PCN reaction occurring within the last 10 years: No If all of the above answers are "NO", then may proceed with Cephalosporin use.     Social History:  The patient  reports that he has never smoked. He has never used smokeless tobacco. He reports that he drinks about 2.0 standard drinks of alcohol per week. He reports that he does not use drugs.   Family history:   Family History  Problem Relation Age of Onset  . Heart disease Father        questionable  . Stroke Mother   . Heart failure Sister   . Healthy Brother   . Healthy Sister   . Parkinsonism Brother   . Colon cancer Neg Hx    Review of systems complete and found to be negative unless listed in HPI.    PHYSICAL EXAM: Vitals:   01/07/18 1006  BP: 110/60  Pulse: 64  SpO2: 98%  Weight: 66 kg (145 lb 9.6 oz)   Wt Readings from Last 3  Encounters:  01/07/18 66 kg (145 lb 9.6 oz)  12/23/17 65.8 kg (145 lb)  11/06/17 65.7 kg (144 lb 12.8 oz)   General:  Well appearing. No resp difficulty HEENT: normal Neck: supple. no JVD. Carotids 2+ bilat; no bruits. No lymphadenopathy or thryomegaly appreciated. Cor: PMI laterally displaced. Regular rate & rhythm. No rubs, gallops or murmurs. Lungs: clear Abdomen: soft, nontender, nondistended. No hepatosplenomegaly. No bruits or masses. Good bowel sounds. Extremities: no cyanosis, clubbing, rash, edema Neuro: alert & orientedx3, cranial nerves grossly intact. moves all 4 extremities w/o difficulty. Affect pleasant   ASSESSMENT AND PLAN:  1. 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  - Volume status stable on exam - Rash after taking Entresto.  - Losartan recently increased to 25 bid - Continue torsemide at 20 daily  - Continue spiro 25 mg daily - Continue bisoprolol 2.5 mg daily. Unable to tolerate higher.  - BMET, BNP today  2. PAF - Regular on exam and ICD - Continue coumadin. Does not want Eliquis due to cost.  3. CAD s/p previous anterior infarct - No s/s of ischemia - Off ASA due to warfarin. Continue statin. - Recent LDL 76 in 1/19 4. LBBB - Now s/p CRT. No change.   Glori Bickers, MD  1:10 PM

## 2018-01-08 ENCOUNTER — Ambulatory Visit (INDEPENDENT_AMBULATORY_CARE_PROVIDER_SITE_OTHER): Payer: Medicare Other | Admitting: Pharmacist Clinician (PhC)/ Clinical Pharmacy Specialist

## 2018-01-08 DIAGNOSIS — Z5181 Encounter for therapeutic drug level monitoring: Secondary | ICD-10-CM

## 2018-01-08 NOTE — Patient Instructions (Signed)
Description   Continue with 1 tablet daily except 1/2 tablet each Monday and Friday. Recheck INR in 6 weeks.

## 2018-01-14 ENCOUNTER — Other Ambulatory Visit: Payer: Self-pay | Admitting: Cardiovascular Disease

## 2018-01-14 DIAGNOSIS — I480 Paroxysmal atrial fibrillation: Secondary | ICD-10-CM

## 2018-01-23 ENCOUNTER — Ambulatory Visit (INDEPENDENT_AMBULATORY_CARE_PROVIDER_SITE_OTHER): Payer: Medicare Other

## 2018-01-23 DIAGNOSIS — Z95810 Presence of automatic (implantable) cardiac defibrillator: Secondary | ICD-10-CM

## 2018-01-23 DIAGNOSIS — I5022 Chronic systolic (congestive) heart failure: Secondary | ICD-10-CM

## 2018-01-23 DIAGNOSIS — I255 Ischemic cardiomyopathy: Secondary | ICD-10-CM | POA: Diagnosis not present

## 2018-01-23 NOTE — Progress Notes (Signed)
Remote ICD transmission.   

## 2018-01-24 LAB — CUP PACEART REMOTE DEVICE CHECK
Battery Remaining Longevity: 94 mo
Brady Statistic AP VS Percent: 1.63 %
Brady Statistic AS VS Percent: 0.26 %
HighPow Impedance: 47 Ohm
HighPow Impedance: 75 Ohm
Implantable Lead Implant Date: 20010806
Implantable Lead Implant Date: 20180813
Implantable Lead Location: 753859
Implantable Lead Location: 753860
Implantable Lead Model: 5092
Lead Channel Impedance Value: 201.488
Lead Channel Impedance Value: 232.653
Lead Channel Impedance Value: 361 Ohm
Lead Channel Impedance Value: 456 Ohm
Lead Channel Impedance Value: 456 Ohm
Lead Channel Impedance Value: 456 Ohm
Lead Channel Impedance Value: 703 Ohm
Lead Channel Impedance Value: 703 Ohm
Lead Channel Impedance Value: 817 Ohm
Lead Channel Pacing Threshold Amplitude: 0.5 V
Lead Channel Pacing Threshold Amplitude: 1.375 V
Lead Channel Pacing Threshold Pulse Width: 0.4 ms
Lead Channel Pacing Threshold Pulse Width: 0.4 ms
Lead Channel Sensing Intrinsic Amplitude: 13.125 mV
Lead Channel Sensing Intrinsic Amplitude: 2.375 mV
Lead Channel Sensing Intrinsic Amplitude: 2.375 mV
Lead Channel Setting Pacing Amplitude: 2 V
Lead Channel Setting Pacing Pulse Width: 0.4 ms
Lead Channel Setting Sensing Sensitivity: 0.3 mV
MDC IDC LEAD IMPLANT DT: 20180813
MDC IDC LEAD LOCATION: 753858
MDC IDC LEAD SERIAL: 104581
MDC IDC MSMT BATTERY VOLTAGE: 2.99 V
MDC IDC MSMT LEADCHNL LV IMPEDANCE VALUE: 201.488
MDC IDC MSMT LEADCHNL LV IMPEDANCE VALUE: 205.114
MDC IDC MSMT LEADCHNL LV IMPEDANCE VALUE: 232.653
MDC IDC MSMT LEADCHNL LV IMPEDANCE VALUE: 475 Ohm
MDC IDC MSMT LEADCHNL LV IMPEDANCE VALUE: 722 Ohm
MDC IDC MSMT LEADCHNL LV IMPEDANCE VALUE: 760 Ohm
MDC IDC MSMT LEADCHNL LV IMPEDANCE VALUE: 779 Ohm
MDC IDC MSMT LEADCHNL LV PACING THRESHOLD PULSEWIDTH: 0.4 ms
MDC IDC MSMT LEADCHNL RV IMPEDANCE VALUE: 703 Ohm
MDC IDC MSMT LEADCHNL RV IMPEDANCE VALUE: 703 Ohm
MDC IDC MSMT LEADCHNL RV PACING THRESHOLD AMPLITUDE: 1.5 V
MDC IDC MSMT LEADCHNL RV SENSING INTR AMPL: 13.125 mV
MDC IDC PG IMPLANT DT: 20180813
MDC IDC SESS DTM: 20191226093825
MDC IDC SET LEADCHNL LV PACING AMPLITUDE: 1.75 V
MDC IDC SET LEADCHNL RV PACING AMPLITUDE: 3 V
MDC IDC SET LEADCHNL RV PACING PULSEWIDTH: 0.4 ms
MDC IDC STAT BRADY AP VP PERCENT: 90.27 %
MDC IDC STAT BRADY AS VP PERCENT: 7.84 %
MDC IDC STAT BRADY RA PERCENT PACED: 90.84 %
MDC IDC STAT BRADY RV PERCENT PACED: 0.55 %

## 2018-01-24 NOTE — Progress Notes (Signed)
EPIC Encounter for ICM Monitoring  Patient Name: [name] is a 77 y.o. male Date: 01/24/2018 Primary Care Physican: Harris, William, MD Primary Cardiologist:Kelly/Bensimhon Electrophysiologist: Klein BiV Pacing: 97.1% Last Weight:139lbs                                                Heart Failure questions reviewed, pt asymptomatic.   Thoracic impedance normal.  Prescribed: Torsemide 20 mg 1 tabletdaily. Potassium 10 mEq take 0.5 tablet daily.  Labs: 01/07/2018 Creatinine 0.88, BUN 12, Potassium 4.1, Sodium 136, eGFR >60 12/23/2017 Creatinine 0.91, BUN 17 07/11/2017 Creatinine 0.89, BUN 12, Potassium 3.5, Sodium 139, EGFR >60 01/01/2017 Creatinine0.79, BUN13, Potassium4.5, Sodium141, EGFR87-100 09/22/2016 Creatinine0.84, BUN13, Potassium3.8, Sodium135, EGFR>60  09/20/2016 Creatinine0.90, BUN19, Potassium4.1, Sodium138, EGFR83-96  08/06/2018Creatinine 0.98, BUN12, Potassium3.4, Sodium137, EGFR75-86  05/15/2018Creatinine 0.86, BUN14, Potassium3.9, Sodium140  02/19/2018Creatinine 1.13, BUN16, Potassium3.4, Sodium135, EGFR>60 A complete set of results can be found in Results Review  Recommendations:  No changes.  Encouraged to call for any fluid symptoms.  Follow-up plan: ICM clinic phone appointment on 02/24/2017.     Copy of ICM check sent to Dr. Klein.   3 month ICM trend: 01/23/2018    1 Year ICM trend:       Laurie S Short, RN 01/24/2018 1:58 PM   

## 2018-01-30 ENCOUNTER — Other Ambulatory Visit (HOSPITAL_COMMUNITY): Payer: Self-pay

## 2018-01-30 MED ORDER — SPIRONOLACTONE 25 MG PO TABS: 25.0000 mg | ORAL_TABLET | Freq: Every day | ORAL | 2 refills | Status: DC

## 2018-01-31 ENCOUNTER — Other Ambulatory Visit (HOSPITAL_COMMUNITY): Payer: Self-pay

## 2018-01-31 MED ORDER — SPIRONOLACTONE 25 MG PO TABS: 25.0000 mg | ORAL_TABLET | Freq: Every day | ORAL | 2 refills | Status: DC

## 2018-02-13 DIAGNOSIS — H43811 Vitreous degeneration, right eye: Secondary | ICD-10-CM | POA: Diagnosis not present

## 2018-02-13 DIAGNOSIS — H469 Unspecified optic neuritis: Secondary | ICD-10-CM | POA: Diagnosis not present

## 2018-02-13 DIAGNOSIS — Z961 Presence of intraocular lens: Secondary | ICD-10-CM | POA: Diagnosis not present

## 2018-02-13 DIAGNOSIS — H35723 Serous detachment of retinal pigment epithelium, bilateral: Secondary | ICD-10-CM | POA: Diagnosis not present

## 2018-02-13 DIAGNOSIS — H353132 Nonexudative age-related macular degeneration, bilateral, intermediate dry stage: Secondary | ICD-10-CM | POA: Diagnosis not present

## 2018-02-13 DIAGNOSIS — H353211 Exudative age-related macular degeneration, right eye, with active choroidal neovascularization: Secondary | ICD-10-CM | POA: Diagnosis not present

## 2018-02-14 ENCOUNTER — Other Ambulatory Visit: Payer: Self-pay | Admitting: *Deleted

## 2018-02-14 ENCOUNTER — Other Ambulatory Visit (HOSPITAL_COMMUNITY): Payer: Self-pay

## 2018-02-14 MED ORDER — LOSARTAN POTASSIUM 25 MG PO TABS: 25.0000 mg | ORAL_TABLET | Freq: Two times a day (BID) | ORAL | 3 refills | Status: DC

## 2018-02-14 MED ORDER — TORSEMIDE 20 MG PO TABS: 20.0000 mg | ORAL_TABLET | Freq: Every day | ORAL | 3 refills | Status: DC

## 2018-02-14 MED ORDER — POTASSIUM CHLORIDE CRYS ER 10 MEQ PO TBCR: 5.0000 meq | EXTENDED_RELEASE_TABLET | Freq: Every day | ORAL | 3 refills | Status: DC

## 2018-02-17 ENCOUNTER — Telehealth: Payer: Self-pay

## 2018-02-17 ENCOUNTER — Ambulatory Visit (INDEPENDENT_AMBULATORY_CARE_PROVIDER_SITE_OTHER): Payer: Medicare Other | Admitting: Pharmacist

## 2018-02-17 DIAGNOSIS — Z7901 Long term (current) use of anticoagulants: Secondary | ICD-10-CM | POA: Diagnosis not present

## 2018-02-17 DIAGNOSIS — I480 Paroxysmal atrial fibrillation: Secondary | ICD-10-CM

## 2018-02-17 DIAGNOSIS — I4891 Unspecified atrial fibrillation: Secondary | ICD-10-CM | POA: Diagnosis not present

## 2018-02-17 DIAGNOSIS — Z5181 Encounter for therapeutic drug level monitoring: Secondary | ICD-10-CM

## 2018-02-17 DIAGNOSIS — R1010 Upper abdominal pain, unspecified: Secondary | ICD-10-CM

## 2018-02-17 LAB — POCT INR: INR: 3.4 — AB (ref 2.0–3.0)

## 2018-02-17 NOTE — Telephone Encounter (Signed)
Called pt. He will go to the lab one day this week. Order for BMP placed

## 2018-02-17 NOTE — Telephone Encounter (Signed)
-----   Message from Dow Adolph sent at 02/17/2018 10:45 AM EST ----- Marzetta Board @ CT needs a BMP for this patient bc he rescheduled

## 2018-02-24 ENCOUNTER — Other Ambulatory Visit (INDEPENDENT_AMBULATORY_CARE_PROVIDER_SITE_OTHER): Payer: Medicare Other

## 2018-02-24 ENCOUNTER — Ambulatory Visit (INDEPENDENT_AMBULATORY_CARE_PROVIDER_SITE_OTHER): Payer: Medicare Other

## 2018-02-24 DIAGNOSIS — Z95810 Presence of automatic (implantable) cardiac defibrillator: Secondary | ICD-10-CM | POA: Diagnosis not present

## 2018-02-24 DIAGNOSIS — R1010 Upper abdominal pain, unspecified: Secondary | ICD-10-CM

## 2018-02-24 DIAGNOSIS — I5022 Chronic systolic (congestive) heart failure: Secondary | ICD-10-CM

## 2018-02-24 LAB — BASIC METABOLIC PANEL
BUN: 16 mg/dL (ref 6–23)
CALCIUM: 9.7 mg/dL (ref 8.4–10.5)
CO2: 31 meq/L (ref 19–32)
CREATININE: 0.91 mg/dL (ref 0.40–1.50)
Chloride: 99 mEq/L (ref 96–112)
GFR: 80.5 mL/min (ref >60.00)
Glucose, Bld: 84 mg/dL (ref 70–99)
Potassium: 3.6 mEq/L (ref 3.5–5.1)
Sodium: 137 mEq/L (ref 135–145)

## 2018-02-25 NOTE — Progress Notes (Signed)
EPIC Encounter for ICM Monitoring  Patient Name: [name] is a 78 y.o. male Date: 02/25/2018 Primary Care Physican: [name], MD Primary Cardiologist:Kelly/Bensimhon Electrophysiologist: [name] BiV Pacing: 97.5% LastWeight:139lbs Today's Weight: 141 lbs    Heart Failure questions reviewed, pt asymptomatic.  Thoracic impedancenormal.  Prescribed:Torsemide 20 mg 1 tabletdaily. Potassium 10 mEq take 0.5 tablet daily.  Labs: 01/07/2018 Creatinine 0.88, BUN 12, Potassium 4.1, Sodium 136, eGFR >60 12/23/2017 Creatinine 0.91, BUN 17 07/11/2017 Creatinine 0.89, BUN 12, Potassium 3.5, Sodium 139, EGFR >60 01/01/2017 Creatinine0.79, BUN13, Potassium4.5, Sodium141, EGFR87-100 09/22/2016 Creatinine0.84, BUN13, Potassium3.8, Sodium135, EGFR>60  09/20/2016 Creatinine0.90, BUN19, Potassium4.1, Sodium138, PTEL07-61  08/06/2018Creatinine 0.98, BUN12, Potassium3.4, Sodium137, HHID43-73  05/15/2018Creatinine 0.86, BUN14, Potassium3.9, Sodium140  02/19/2018Creatinine 1.13, BUN16, Potassium3.4, Sodium135, EGFR>60 A complete set of results can be found in Results Review  Recommendations:No changes. Encouraged to call for any fluid symptoms.  Follow-up plan: ICM clinic phone appointment on03/03/2018.   Copy of ICM check sent to [place].  3 month ICM trend: 02/24/2018    1 Year ICM trend:       [name], RN 02/25/2018 11:57 AM

## 2018-03-05 ENCOUNTER — Ambulatory Visit (INDEPENDENT_AMBULATORY_CARE_PROVIDER_SITE_OTHER)
Admission: RE | Admit: 2018-03-05 | Discharge: 2018-03-05 | Disposition: A | Discharge status: Home / Self Care | Payer: Medicare Other | Source: Ambulatory Visit | Attending: Gastroenterology | Admitting: Gastroenterology

## 2018-03-05 DIAGNOSIS — N323 Diverticulum of bladder: Secondary | ICD-10-CM | POA: Diagnosis not present

## 2018-03-05 DIAGNOSIS — R1010 Upper abdominal pain, unspecified: Secondary | ICD-10-CM | POA: Diagnosis not present

## 2018-03-05 DIAGNOSIS — K4090 Unilateral inguinal hernia, without obstruction or gangrene, not specified as recurrent: Secondary | ICD-10-CM | POA: Diagnosis not present

## 2018-03-05 MED ORDER — IOPAMIDOL (ISOVUE-300) INJECTION 61%
100.0000 mL | Freq: Once | INTRAVENOUS | Status: AC | PRN
  Administered 2018-03-05: 100 mL via INTRAVENOUS

## 2018-03-07 ENCOUNTER — Ambulatory Visit (INDEPENDENT_AMBULATORY_CARE_PROVIDER_SITE_OTHER): Payer: Medicare Other | Admitting: Pharmacist Clinician (PhC)/ Clinical Pharmacy Specialist

## 2018-03-07 DIAGNOSIS — Z7901 Long term (current) use of anticoagulants: Secondary | ICD-10-CM | POA: Diagnosis not present

## 2018-03-07 DIAGNOSIS — Z5181 Encounter for therapeutic drug level monitoring: Secondary | ICD-10-CM

## 2018-03-07 DIAGNOSIS — I480 Paroxysmal atrial fibrillation: Secondary | ICD-10-CM

## 2018-03-07 DIAGNOSIS — I4891 Unspecified atrial fibrillation: Secondary | ICD-10-CM | POA: Diagnosis not present

## 2018-03-07 LAB — POCT INR: INR: 2.5 (ref 2.0–3.0)

## 2018-03-11 DIAGNOSIS — R972 Elevated prostate specific antigen [PSA]: Secondary | ICD-10-CM | POA: Diagnosis not present

## 2018-03-11 DIAGNOSIS — N323 Diverticulum of bladder: Secondary | ICD-10-CM | POA: Diagnosis not present

## 2018-03-17 ENCOUNTER — Other Ambulatory Visit (HOSPITAL_COMMUNITY): Payer: Self-pay

## 2018-03-17 MED ORDER — BISOPROLOL FUMARATE 5 MG PO TABS: ORAL_TABLET | ORAL | 1 refills | Status: DC

## 2018-03-24 ENCOUNTER — Encounter (HOSPITAL_BASED_OUTPATIENT_CLINIC_OR_DEPARTMENT_OTHER): Payer: Self-pay | Admitting: *Deleted

## 2018-03-24 ENCOUNTER — Other Ambulatory Visit: Payer: Self-pay

## 2018-03-24 ENCOUNTER — Emergency Department (HOSPITAL_BASED_OUTPATIENT_CLINIC_OR_DEPARTMENT_OTHER): Payer: Medicare Other

## 2018-03-24 ENCOUNTER — Emergency Department (HOSPITAL_BASED_OUTPATIENT_CLINIC_OR_DEPARTMENT_OTHER)
Admission: EM | Admit: 2018-03-24 | Discharge: 2018-03-24 | Disposition: A | Discharge status: Home / Self Care | Payer: Medicare Other | Attending: Emergency Medicine | Admitting: Emergency Medicine

## 2018-03-24 DIAGNOSIS — J45909 Unspecified asthma, uncomplicated: Secondary | ICD-10-CM | POA: Diagnosis not present

## 2018-03-24 DIAGNOSIS — I509 Heart failure, unspecified: Secondary | ICD-10-CM | POA: Insufficient documentation

## 2018-03-24 DIAGNOSIS — I2510 Atherosclerotic heart disease of native coronary artery without angina pectoris: Secondary | ICD-10-CM | POA: Insufficient documentation

## 2018-03-24 DIAGNOSIS — R0602 Shortness of breath: Secondary | ICD-10-CM

## 2018-03-24 DIAGNOSIS — Z951 Presence of aortocoronary bypass graft: Secondary | ICD-10-CM | POA: Insufficient documentation

## 2018-03-24 LAB — COMPREHENSIVE METABOLIC PANEL
ALT: 16 U/L (ref 0–44)
ANION GAP: 8 (ref 5–15)
AST: 24 U/L (ref 15–41)
Albumin: 4.6 g/dL (ref 3.5–5.0)
Alkaline Phosphatase: 67 U/L (ref 38–126)
BUN: 15 mg/dL (ref 8–23)
CO2: 28 mmol/L (ref 22–32)
Calcium: 9.3 mg/dL (ref 8.9–10.3)
Chloride: 100 mmol/L (ref 98–111)
Creatinine, Ser: 1 mg/dL (ref 0.61–1.24)
GFR calc Af Amer: >60 mL/min (ref >60)
GFR calc non Af Amer: >60 mL/min (ref >60)
Glucose, Bld: 90 mg/dL (ref 70–99)
POTASSIUM: 3.7 mmol/L (ref 3.5–5.1)
SODIUM: 136 mmol/L (ref 135–145)
Total Bilirubin: 1.1 mg/dL (ref 0.3–1.2)
Total Protein: 7.7 g/dL (ref 6.5–8.1)

## 2018-03-24 LAB — CBC WITH DIFFERENTIAL/PLATELET
Abs Immature Granulocytes: 0.02 10*3/uL (ref 0.00–0.07)
Basophils Absolute: 0.1 10*3/uL (ref 0.0–0.1)
Basophils Relative: 1 %
Eosinophils Absolute: 0.2 10*3/uL (ref 0.0–0.5)
Eosinophils Relative: 2 %
HCT: 46.3 % (ref 39.0–52.0)
HEMOGLOBIN: 14.4 g/dL (ref 13.0–17.0)
Immature Granulocytes: 0 %
LYMPHS PCT: 18 %
Lymphs Abs: 1.7 10*3/uL (ref 0.7–4.0)
MCH: 28.5 pg (ref 26.0–34.0)
MCHC: 31.1 g/dL (ref 30.0–36.0)
MCV: 91.7 fL (ref 80.0–100.0)
MONO ABS: 0.9 10*3/uL (ref 0.1–1.0)
Monocytes Relative: 10 %
NEUTROS PCT: 69 %
NRBC: 0 % (ref 0.0–0.2)
Neutro Abs: 6.8 10*3/uL (ref 1.7–7.7)
Platelets: 261 10*3/uL (ref 150–400)
RBC: 5.05 MIL/uL (ref 4.22–5.81)
RDW: 14.2 % (ref 11.5–15.5)
WBC: 9.7 10*3/uL (ref 4.0–10.5)

## 2018-03-24 LAB — PROTIME-INR
INR: 2.4
Prothrombin Time: 25.6 seconds — ABNORMAL HIGH (ref 11.4–15.2)

## 2018-03-24 LAB — TROPONIN I: Troponin I: <0.03 ng/mL (ref <0.03)

## 2018-03-24 LAB — BRAIN NATRIURETIC PEPTIDE: B Natriuretic Peptide: 770.2 pg/mL — ABNORMAL HIGH (ref 0.0–100.0)

## 2018-03-24 NOTE — ED Triage Notes (Signed)
Sob x 2 days. Hx of CHF.

## 2018-03-24 NOTE — ED Provider Notes (Signed)
Emergency Department Provider Note   I have reviewed the triage vital signs and the nursing notes.   HISTORY  Chief Complaint Shortness of Breath   HPI [name] is a 78 y.o. male with PMH of CHF (EF 20-25%), GERD, NSVT, and A-fib on Coumadin presents to the emergency department primarily with exertional dyspnea.  Patient has noticed symptoms worsening over the past 2 days.  States he is typically able to walk 6000-8000 steps daily.  He has noticed in the past 2 days he has become very short of breath with fairly minimal walking.  He denies any heart palpitations or chest pain.  No fevers, congestion, cough.  He has not noticed significant swelling in his legs.  He has been compliant with all medications including his diuretics.  He sleeps propped up on 2 pillows as he has done for the past 30 years.  Patient's wife at bedside states that he has been complaining more of shortness of breath at night. No PND.  Patient has also noticed some abdominal bloating which has been an ongoing issue for him.  He states he has had CT imaging as well as endoscopy and was diagnosed with IBS.  No worsening abdominal or back pain.  No vomiting or diarrhea.   Past Medical History:  Diagnosis Date  . Acute on chronic systolic CHF (congestive heart failure) ([place]) 06/09/2015  . 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 Cardiomyopathy, ischemic 2011   with EF 25-35% by echo  . Coronary artery disease    Hx MI 1992, CABG 1998 , Nuc study 11.2013 large scar but no ischemia  . GERD (gastroesophageal reflux disease)   . H. pylori infection    Hx of   . 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 NSVT (nonsustained ventricular tachycardia) ([place])   . Paroxysmal atrial fibrillation [facility])     Patient Active Problem List   Diagnosis Date Noted  . Early satiety 09/11/2017  . Encounter for therapeutic drug monitoring  10/22/2016  . Congestive heart failure, NYHA class 3 ([place]) 09/10/2016  . LBBB (left bundle branch block) 11/24/2015  . Generalized abdominal fullness 06/17/2015  . Pseudogout of knee   . NSVT (nonsustained ventricular tachycardia) ([place])   . Cholecystitis   . Hyperlipidemia LDL goal <70 07/08/2014  . Hemorrhoids, internal 05/24/2014  . Abdominal pain, chronic, epigastric 04/08/2014  . 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 Bradycardia 08/11/2012  . CABG '98. Low risk Myoview 11/13   . ICD (implantable cardioverter-defibrillator) in place   . SYSTOLIC HEART FAILURE, 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  . History of ventricular tachycardia 05/04/2008  . GERD 03/02/2008  . Asthma 10/09/2007  . Irritable bowel syndrome 10/09/2007  . Ischemic cardiomyopathy 05/13/2007    Past Surgical History:  Procedure Laterality Date  . BIV UPGRADE N/A 09/10/2016   Procedure: BiV ICD Upgrade;  Surgeon: Deboraha Sprang, MD;  Location: [facility];  Service: Cardiovascular;  Laterality: N/A;  . CATARACT EXTRACTION, BILATERAL    . CORONARY ARTERY BYPASS GRAFT  1998   x 5  . HERNIA REPAIR  1960's   right inguinal  . ICD GENERATOR CHANGE  01/2008, 2018   medtronic, hx+ EP study  . KNEE SURGERY     right  . NASAL SINUS SURGERY  05/31/2010   Dr. Benjamine Mola    Allergies Amoxicillin and Penicillins  Family History  Problem Relation Age of Onset  .  Heart disease Father        questionable  . Stroke Mother   . Heart failure Sister   . Healthy Brother   . Healthy Sister   . Parkinsonism Brother   . Colon cancer Neg Hx     Social History Social History   Tobacco Use  . Smoking status: Never Smoker  . Smokeless tobacco: Never Used  Substance Use Topics  . Alcohol use: Yes    Alcohol/week: 2.0 standard drinks    Types: 2 Glasses of wine per week    Comment: socially  . Drug use: No    Review of Systems  Constitutional: No fever/chills Eyes: No visual changes. ENT: No sore throat. Cardiovascular: Denies  chest pain. Respiratory: Positive episodic shortness of breath. Gastrointestinal: No abdominal pain.  No nausea, no vomiting.  No diarrhea.  No constipation. Genitourinary: Negative for dysuria. Musculoskeletal: Negative for back pain. Skin: Negative for rash. Neurological: Negative for headaches, focal weakness or numbness.  10-point ROS otherwise negative.  ____________________________________________   PHYSICAL EXAM:  VITAL SIGNS: ED Triage Vitals  Enc Vitals Group     BP 03/24/18 1655 118/75     Pulse Rate 03/24/18 1655 71     Resp 03/24/18 1655 20     Temp 03/24/18 1655 (!) 97.5 F (36.4 C)     Temp Source 03/24/18 1655 Oral     SpO2 03/24/18 1655 99 %     Weight 03/24/18 1654 141 lb (64 kg)     Height 03/24/18 1654 5\' 7"  (1.702 m)   Constitutional: Alert and oriented. Well appearing and in no acute distress. Eyes: Conjunctivae are normal.  Head: Atraumatic. Nose: No congestion/rhinnorhea. Mouth/Throat: Mucous membranes are moist.  Oropharynx non-erythematous. Neck: No stridor.  Cardiovascular: Normal rate, regular rhythm. Good peripheral circulation. Grossly normal heart sounds. No JVP.  Respiratory: Normal respiratory effort.  No retractions. Lungs CTAB. Gastrointestinal: Soft and nontender. No distention.  Musculoskeletal: No lower extremity tenderness. Trace bilateral LE edema. No gross deformities of extremities. Neurologic:  Normal speech and language. No gross focal neurologic deficits are appreciated.  Skin:  Skin is warm, dry and intact. No rash noted.   ____________________________________________   LABS (all labs ordered are listed, but only abnormal results are displayed)  Labs Reviewed  BRAIN NATRIURETIC PEPTIDE - Abnormal; Notable for the following components:      Result Value   B Natriuretic Peptide 770.2 (*)    All other components within normal limits  PROTIME-INR - Abnormal; Notable for the following components:   Prothrombin Time 25.6 (*)     All other components within normal limits  COMPREHENSIVE METABOLIC PANEL  TROPONIN I  CBC WITH DIFFERENTIAL/PLATELET   ____________________________________________  EKG   EKG Interpretation  Date/Time:  Monday March 24 2018 16:57:50 EST Ventricular Rate:  67 PR Interval:  150 QRS Duration: 120 QT Interval:  456 QTC Calculation: 481 R Axis:   126 Text Interpretation:  Atrial-sensed ventricular-paced rhythm Abnormal ECG No STEMI.  Confirmed by Nanda Quinton [phone]) on 03/24/2018 5:14:14 PM       ____________________________________________  RADIOLOGY  Dg Chest 2 View  Result Date: 03/24/2018 CLINICAL DATA:  Shortness of breath EXAM: CHEST - 2 VIEW COMPARISON:  12/30/2017 FINDINGS: Left AICD remains in place, unchanged. Prior CABG. Cardiomegaly. Mild hyperinflation of the lungs. No confluent opacities, effusions or edema. No acute bony abnormality. IMPRESSION: Cardiomegaly.  Mild hyperinflation.  No active disease. Electronically Signed   By: Rolm Baptise M.D.   On:  03/24/2018 18:22    ____________________________________________   PROCEDURES  Procedure(s) performed:   Procedures  None ____________________________________________   INITIAL IMPRESSION / ASSESSMENT AND PLAN / ED COURSE  Pertinent labs & imaging results that were available during my care of the patient were reviewed by me and considered in my medical decision making (see chart for details).  Patient presents to the emergency department for evaluation of shortness of breath mainly with exertion and increased shortness of breath when lying in bed although is not requiring additional pillows.  Patient has ejection fraction of 20 to 25% and is followed by cardiology at [facility].  He does not appear grossly volume overloaded on my assessment.  Plan for chest x-ray along with screening lab work.   Chest x-ray and lab work reviewed with no acute findings.  BNP is slightly elevated but near baseline.   Patient does not appear clinically volume overloaded without JVP or significant lower extremity edema.  He ambulated in the emergency department with no dyspnea or hypoxemia.  Extremely low suspicion for pulmonary embolism.  I advise close PCP and cardiology follow-up.  Patient will call tomorrow to make appointments.  Discussed ED return precautions in detail.  No change in home medications.  ____________________________________________  FINAL CLINICAL IMPRESSION(S) / ED DIAGNOSES  Final diagnoses:  SOB (shortness of breath)    Note:  This document was prepared using Dragon voice recognition software and may include unintentional dictation errors.  Nanda Quinton, MD Emergency Medicine    Long, [facility], MD 03/25/18 [phone]

## 2018-03-24 NOTE — ED Notes (Signed)
Pt ambulated around department and maintained 97% with HR of 72. Pt ambulated with steady gate and reported no SOB

## 2018-03-24 NOTE — Discharge Instructions (Signed)
You were seen in the ED today with shortness of breath symptoms. Your x-ray and labs are normal. Continue your home medications and call the Cardiology office tomorrow to set up an urgent follow up appointment. Return to the ED with any new or worsening symptoms.

## 2018-03-25 ENCOUNTER — Telehealth (HOSPITAL_COMMUNITY): Payer: Self-pay | Admitting: *Deleted

## 2018-03-25 NOTE — Telephone Encounter (Signed)
Late entry  Patient called yesterday requesting an office visit he said he noticed hes been more short of breath with exertion and sometimes at rest. Pt only wanted to be seen by Dr.Bensimhon and not our APP clinic. I told pt I would follow up with Dr.Bensimhon for advice but if his symptoms worsened he should go to the emergency room. I called pt back yesterday afternoon to offer him an appt but pt did not answer. Following up this morning I see pt went to the emergency room at 4pm. I left VM for pt to call to make a follow up appt.

## 2018-03-31 ENCOUNTER — Telehealth: Payer: Self-pay | Admitting: Gastroenterology

## 2018-03-31 ENCOUNTER — Ambulatory Visit (INDEPENDENT_AMBULATORY_CARE_PROVIDER_SITE_OTHER): Payer: Medicare Other

## 2018-03-31 DIAGNOSIS — Z95810 Presence of automatic (implantable) cardiac defibrillator: Secondary | ICD-10-CM | POA: Diagnosis not present

## 2018-03-31 DIAGNOSIS — I5022 Chronic systolic (congestive) heart failure: Secondary | ICD-10-CM | POA: Diagnosis not present

## 2018-03-31 NOTE — Telephone Encounter (Signed)
Thanks for the note [name]. This patient has chronic intermittent pain, had a CT scan last month which looked okay and labs last week which were normal. Bentyl can cause sedation. We can try some IB gard - he can purchase it OTC or try a sample if we have any in clinic, to see if that helps. I had otherwise recommend he see a Urologist following his CT scan result, hopefully he has been able to do that. Thanks

## 2018-03-31 NOTE — Telephone Encounter (Signed)
Samples found for the patient. He agrees to this plan and will pick up his samples today or tomorrow.

## 2018-03-31 NOTE — Telephone Encounter (Signed)
Spoke with the patient. He tells me today is better than yesterday for his mid upper abdominal pain. He does not rate it at 7 to 8 presently. He took Bentyl 3 times yesterday with relief and "slept like a baby." Today he has taken Bentyl one time and it does help. Soft daily bowel movements. No fevers, diarrhea or blood with bowel movement. He asks if there is something besides Dicyclomine he could try. He says it make him SOB in addition to sleepy.

## 2018-03-31 NOTE — Telephone Encounter (Signed)
Pt is scheduled for OV for [facility] 3.11.20.  Pt reported having abd p 7-8 on scale.  Please advise.

## 2018-04-01 NOTE — Progress Notes (Signed)
EPIC Encounter for ICM Monitoring  Patient Name: [name] is a 78 y.o. male Date: 04/01/2018 Primary Care Physican: [name], MD Primary Cardiologist:Kelly/Bensimhon Electrophysiologist: [name] BiV Pacing: 97.2% LastWeight:139lbs Today's Weight: 140 lbs   Heart Failure questions reviewed, pt asymptomatic.    Thoracic impedancenormal.  Prescribed:Torsemide 20 mg 1 tabletdaily. Potassium 10 mEq take 0.5 tablet daily.  Labs: 01/07/2018 Creatinine 0.88, BUN 12, Potassium 4.1, Sodium 136, eGFR >60 12/23/2017 Creatinine 0.91, BUN 17 07/11/2017 Creatinine 0.89, BUN 12, Potassium 3.5, Sodium 139, EGFR >60 01/01/2017 Creatinine0.79, BUN13, Potassium4.5, Sodium141, [id number] 09/22/2016 Creatinine0.84, BUN13, Potassium3.8, Sodium135, EGFR>60  09/20/2016 Creatinine0.90, BUN19, Potassium4.1, Sodium138, [id number]  08/06/2018Creatinine 0.98, BUN12, Potassium3.4, Sodium137, [id number]  05/15/2018Creatinine 0.86, BUN14, Potassium3.9, Sodium140  02/19/2018Creatinine 1.13, BUN16, Potassium3.4, Sodium135, EGFR>60 A complete set of results can be found in Results Review  Recommendations:No changes. Encouraged to call for any fluid symptoms.  Follow-up plan: ICM clinic phone appointment on4/06/2018. Office visit scheduled 05/13/2018 with Dr [name].  Copy of ICM check sent to [name].  3 month ICM trend: 03/31/2018    1 Year ICM trend:       Jason Billings, RN 04/01/2018 3:36 PM

## 2018-04-04 ENCOUNTER — Ambulatory Visit (INDEPENDENT_AMBULATORY_CARE_PROVIDER_SITE_OTHER): Payer: Medicare Other | Admitting: Pharmacist Clinician (PhC)/ Clinical Pharmacy Specialist

## 2018-04-04 DIAGNOSIS — I480 Paroxysmal atrial fibrillation: Secondary | ICD-10-CM

## 2018-04-04 DIAGNOSIS — Z7901 Long term (current) use of anticoagulants: Secondary | ICD-10-CM | POA: Diagnosis not present

## 2018-04-04 DIAGNOSIS — I4891 Unspecified atrial fibrillation: Secondary | ICD-10-CM | POA: Diagnosis not present

## 2018-04-04 DIAGNOSIS — Z5181 Encounter for therapeutic drug level monitoring: Secondary | ICD-10-CM

## 2018-04-04 LAB — POCT INR: INR: 3.6 — AB (ref 2.0–3.0)

## 2018-04-09 ENCOUNTER — Ambulatory Visit (INDEPENDENT_AMBULATORY_CARE_PROVIDER_SITE_OTHER): Payer: Medicare Other | Admitting: Physician Assistant

## 2018-04-09 ENCOUNTER — Encounter: Payer: Self-pay | Admitting: Physician Assistant

## 2018-04-09 ENCOUNTER — Other Ambulatory Visit: Payer: Self-pay

## 2018-04-09 VITALS — BP 102/50 | HR 64 | Ht 65.5 in | Wt 148.0 lb

## 2018-04-09 DIAGNOSIS — R1010 Upper abdominal pain, unspecified: Secondary | ICD-10-CM

## 2018-04-09 DIAGNOSIS — K589 Irritable bowel syndrome without diarrhea: Secondary | ICD-10-CM

## 2018-04-09 MED ORDER — OMEPRAZOLE 20 MG PO CPDR: 20.0000 mg | DELAYED_RELEASE_CAPSULE | ORAL | 3 refills | Status: DC

## 2018-04-09 MED ORDER — DICYCLOMINE HCL 10 MG PO CAPS: ORAL_CAPSULE | ORAL | 3 refills | Status: DC

## 2018-04-09 NOTE — Progress Notes (Signed)
Chief Complaint: IBS with abdominal pain  HPI:    [name] is a 78 year old Caucasian male with a past medical history as listed below including coronary artery disease and NSVT with history of CHF and EF 20%, known to 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    12/23/2017 seen by [name].  Was having some abdominal pain and he was scheduled for CT.  Told to continue Bentyl, omeprazole and the low FODMAP diet.    03/05/2018 CT abdomen pelvis with no acute findings, mildly enlarged prostate, large left posterior bladder diverticulum, colonic diverticulosis without diverticulitis, small left inguinal hernia, cardiomegaly and findings consistent with right heart insufficiency.    Today, the patient tells me that he is doing fairly well on his regular IBS medications including Dicyclomine 10 mg which he uses as needed up to twice a day, but sometimes "I do not need it at all".  Also uses Omeprazole 20 mg daily which prevents any reflux symptoms.  Tells me IBgard has also been helpful for him in the past.  Typically he will take 2 tabs once a day and this seems to keep things under control.  He does ask for samples and coupons.  Overall the patient tells me that his symptoms are moderately controlled on current therapy.  He asked if there is anything new he should be doing.    Denies fever, chills, weight loss, anorexia, nausea, vomiting, heartburn, reflux or symptoms that awaken him from sleep.  Past Medical History:  Diagnosis Date  . Acute on chronic systolic CHF (congestive heart failure) ([place]) 06/09/2015  . 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 Cardiomyopathy, ischemic 2011   with EF 25-35% by echo  . Coronary artery disease    Hx MI 1992, CABG 1998 , Nuc study 11.2013 large scar but no ischemia  . GERD (gastroesophageal reflux disease)   . H. pylori infection    Hx of   . H/O myocardial  infarction, greater than 8 weeks 1992   large ant wall injury  . NSVT (nonsustained ventricular tachycardia) ([place])   . Paroxysmal atrial fibrillation [facility])     Past Surgical History:  Procedure Laterality Date  . BIV UPGRADE N/A 09/10/2016   Procedure: BiV ICD Upgrade;  Surgeon: Deboraha Sprang, MD;  Location: [facility];  Service: Cardiovascular;  Laterality: N/A;  . CATARACT EXTRACTION, BILATERAL    . CORONARY ARTERY BYPASS GRAFT  1998   x 5  . HERNIA REPAIR  1960's   right inguinal  . ICD GENERATOR CHANGE  01/2008, 2018   medtronic, hx+ EP study  . KNEE SURGERY     right  . NASAL SINUS SURGERY  05/31/2010   Dr. Benjamine Mola    Current Outpatient Medications  Medication Sig Dispense Refill  . acetaminophen (TYLENOL) 650 MG CR tablet Take 650 mg by mouth 2 (two) times daily.     Marland Kitchen allopurinol (ZYLOPRIM) 300 MG tablet Take 300 mg by mouth daily.    . AMBULATORY NON FORMULARY MEDICATION Medication Name: Hydrocortisone cream 1%: Apply to the rectum once a day for a week, then as needed    . AMBULATORY NON FORMULARY MEDICATION IBgard- Take as directed 16 capsule 0  . bisoprolol (ZEBETA) 5 MG tablet TAKE 1/2 (ONE-HALF) TABLET BY MOUTH ONCE DAILY 45 tablet 1  . cetirizine (ZYRTEC) 10 MG tablet Take 10 mg by mouth daily.      Marland Kitchen  Cholecalciferol (VITAMIN D) 2000 units CAPS Take 1-2 capsules by mouth daily.    . colchicine 0.6 MG tablet Take 0.6 mg by mouth daily as needed.    . dicyclomine (BENTYL) 10 MG capsule TAKE 1 CAPSULE BY MOUTH EVERY 8 HOURS AS NEEDED FOR SPASMS 90 capsule 3  . esomeprazole (NEXIUM) 40 MG capsule Take by mouth.    . isosorbide mononitrate (IMDUR) 30 MG 24 hr tablet Take 15 mg by mouth daily.    Marland Kitchen LORazepam (ATIVAN) 1 MG tablet Take 0.25-0.5 mg by mouth as needed.     Marland Kitchen losartan (COZAAR) 25 MG tablet Take 1 tablet (25 mg total) by mouth 2 (two) times daily. 180 tablet 3  . losartan (COZAAR) 25 MG tablet Take 1 tablet (25 mg total) by mouth 2 (two) times daily. 180  tablet 3  . magnesium oxide (MAG-OX) 400 MG tablet Take 400 mg by mouth daily at 12 noon.     . mometasone (ASMANEX) 220 MCG/INH inhaler Inhale 1 puff into the lungs daily.     . mometasone (ASMANEX) 220 MCG/INH inhaler Inhale into the lungs.    . montelukast (SINGULAIR) 10 MG tablet Take 1 tablet (10 mg total) by mouth at bedtime. PLEASE SCHEDULE APPOINTMENT. 30 tablet 0  . nitroGLYCERIN (NITROSTAT) 0.4 MG SL tablet Place 1 tablet (0.4 mg total) under the tongue every 5 (five) minutes as needed for chest pain. 25 tablet 1  . Omega-3 1000 MG CAPS Take by mouth.    Marland Kitchen omeprazole (PRILOSEC) 20 MG capsule Take 1 capsule (20 mg total) by mouth every morning. 90 capsule 3  . Polyethyl Glycol-Propyl Glycol (SYSTANE OP) Apply 2-3 drops to eye 3 (three) times daily as needed (dry eyes).    . potassium chloride (K-DUR,KLOR-CON) 10 MEQ tablet Take 0.5 tablets (5 mEq total) by mouth daily. 45 tablet 3  . silodosin (RAPAFLO) 8 MG CAPS capsule Take 8 mg by mouth every other day.    . simvastatin (ZOCOR) 40 MG tablet Take 1 tablet (40 mg total) by mouth at bedtime. 90 tablet 1  . spironolactone (ALDACTONE) 25 MG tablet Take 1 tablet (25 mg total) by mouth daily. 90 tablet 2  . torsemide (DEMADEX) 20 MG tablet Take 1 tablet (20 mg total) by mouth daily. 90 tablet 3  . warfarin (COUMADIN) 5 MG tablet TAKE 1/2 TO 1 (ONE-HALF TO ONE) TABLET BY MOUTH ONCE DAILY AS DIRECTED BY  COUMADIN  CLINIC 90 tablet 3   No current facility-administered medications for this visit.     Allergies as of 04/09/2018 - Review Complete 04/09/2018  Allergen Reaction Noted  . Amoxicillin Rash 08/16/2006  . Penicillins Rash 08/16/2006    Family History  Problem Relation Age of Onset  . Heart disease Father        questionable  . Stroke Mother   . Heart failure Sister   . Healthy Brother   . Healthy Sister   . Parkinsonism Brother   . Colon cancer Neg Hx     Social History   Socioeconomic History  . Marital status:  Married    Spouse name: Not on file  . Number of children: 3  . Years of education: Not on file  . Highest education level: Not on file  Occupational History  . 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 Financial resource strain: Not on file  . Food insecurity:    Worry: Not on file    Inability: Not on file  .  Transportation needs:    Medical: Not on file    Non-medical: Not on file  Tobacco Use  . Smoking status: Never Smoker  . Smokeless tobacco: Never Used  Substance and Sexual Activity  . Alcohol use: Yes    Alcohol/week: 2.0 standard drinks    Types: 2 Glasses of wine per week    Comment: socially  . Drug use: No  . Sexual activity: Not on file  Lifestyle  . Physical activity:    Days per week: Not on file    Minutes per session: Not on file  . Stress: Not on file  Relationships  . Social connections:    Talks on phone: Not on file    Gets together: Not on file    Attends religious service: Not on file    Active member of club or organization: Not on file    Attends meetings of clubs or organizations: Not on file    Relationship status: Not on file  . Intimate partner violence:    Fear of current or ex partner: Not on file    Emotionally abused: Not on file    Physically abused: Not on file    Forced sexual activity: Not on file  Other Topics Concern  . Not on file  Social History Narrative  . Not on file    Review of Systems:    Constitutional: No weight loss, fever or chills Cardiovascular: No chest pain Respiratory: No SOB Gastrointestinal: See HPI and otherwise negative   Physical Exam:  Vital signs: BP (!) 102/50   Pulse 64   Ht 5' 5.5" (1.664 m)   Wt 148 lb (67.1 kg)   BMI 24.25 kg/m   Constitutional:   Pleasant male appears to be in NAD, Well developed, Well nourished, alert and cooperative Respiratory: Respirations even and unlabored. Lungs clear to auscultation bilaterally.   No wheezes, crackles, or rhonchi.   Cardiovascular: Normal S1, S2. No MRG. Regular rate and rhythm. No peripheral edema, cyanosis or pallor.  Gastrointestinal:  Soft, nondistended, nontender. No rebound or guarding. Normal bowel sounds. No appreciable masses or hepatomegaly. Rectal:  Not performed.  Psychiatric: Demonstrates good judgement and reason without abnormal affect or behaviors.  MOST RECENT LABS AND IMAGING: CBC    Component Value Date/Time   WBC 9.7 03/24/2018 1720   RBC 5.05 03/24/2018 1720   HGB 14.4 03/24/2018 1720   HGB 13.4 09/19/2017 1453   HCT 46.3 03/24/2018 1720   HCT 40.0 09/19/2017 1453   PLT 261 03/24/2018 1720   PLT 267 09/19/2017 1453   MCV 91.7 03/24/2018 1720   MCV 86 09/19/2017 1453   MCH 28.5 03/24/2018 1720   MCHC 31.1 03/24/2018 1720   RDW 14.2 03/24/2018 1720   RDW 14.0 09/19/2017 1453   LYMPHSABS 1.7 03/24/2018 1720   LYMPHSABS 1.8 09/03/2016 0936   MONOABS 0.9 03/24/2018 1720   EOSABS 0.2 03/24/2018 1720   EOSABS 0.5 (H) 09/03/2016 0936   BASOSABS 0.1 03/24/2018 1720   BASOSABS 0.1 09/03/2016 0936    CMP     Component Value Date/Time   NA 136 03/24/2018 1720   NA 141 01/01/2017 0938   K 3.7 03/24/2018 1720   CL 100 03/24/2018 1720   CO2 28 03/24/2018 1720   GLUCOSE 90 03/24/2018 1720   BUN 15 03/24/2018 1720   BUN 13 01/01/2017 0938   CREATININE 1.00 03/24/2018 1720   CREATININE 0.86 06/12/2016 0842   CALCIUM 9.3 03/24/2018 1720   PROT 7.7  03/24/2018 1720   ALBUMIN 4.6 03/24/2018 1720   AST 24 03/24/2018 1720   ALT 16 03/24/2018 1720   ALKPHOS 67 03/24/2018 1720   BILITOT 1.1 03/24/2018 1720   GFRNONAA >60 03/24/2018 1720   GFRAA >60 03/24/2018 1720    Assessment: 1.  IBS-D: Symptoms are moderately controlled with Dicyclomine 2.  Epigastric pain: Controlled with Omeprazole/Dicyclomine/IBgard  Plan: 1.  Continue current therapies. 2.  Refilled Dicyclomine 10 mg every 8 hours as needed.  Patient asked for #90, refill x3 3.  Refilled Omeprazole 20 mg daily,  30-60 minutes before eating breakfast #90 with 3 refills 4.  Discussed the low FODMAP diet in detail.  Patient did not sound as though he had ever heard of this, it was in previous notes.  Did provide him with a copy of this diet.  He thinks he will give it a try. 5.  Patient to follow in clinic as needed in the future with [name] or myself.  Jason Newer, PA-C [facility] 04/09/2018, 12:23 PM  Cc: [name], MD

## 2018-04-09 NOTE — Patient Instructions (Signed)
We have sent the following medications to your pharmacy for you to pick up at your convenience:  Normal BMI (Body Mass Index- based on height and weight) is between 23 and 30. Your BMI today is Body mass index is 24.25 kg/m. Marland Kitchen Please consider follow up  regarding your BMI with your Primary Care Provider.

## 2018-04-09 NOTE — Progress Notes (Signed)
Agree with assessment and plan as outlined.  

## 2018-04-14 ENCOUNTER — Telehealth: Payer: Self-pay | Admitting: Physician Assistant

## 2018-04-14 NOTE — Telephone Encounter (Signed)
BRBPR started last night, only on toilet tissue.  Has normal BM.  Takes coumadin.  Internal hemorrhoid history.  Has had 1 hemorrhoid banding.  Pt was advised to try prep H and sitz baths.  He was told to keep his bowels soft and avoid constipation.  I will send to Dr Havery Moros for any further req's.

## 2018-04-14 NOTE — Telephone Encounter (Signed)
The patient has been notified of this information and all questions answered. The pt has been advised of the information and verbalized understanding.    

## 2018-04-14 NOTE — Telephone Encounter (Signed)
Sounds like scant rectal bleeding from known hemorrhoids. We had banded him once on coumadin and he had a nice response. I would monitor for now with conservative therapy as you recommended. If worsens he can call us back. Thanks

## 2018-04-15 ENCOUNTER — Other Ambulatory Visit (HOSPITAL_COMMUNITY): Payer: Self-pay | Admitting: Internal Medicine

## 2018-04-17 ENCOUNTER — Telehealth: Payer: Self-pay

## 2018-04-17 NOTE — Telephone Encounter (Signed)

## 2018-04-18 ENCOUNTER — Ambulatory Visit (INDEPENDENT_AMBULATORY_CARE_PROVIDER_SITE_OTHER): Payer: Medicare Other | Admitting: *Deleted

## 2018-04-18 ENCOUNTER — Other Ambulatory Visit: Payer: Self-pay

## 2018-04-18 DIAGNOSIS — I480 Paroxysmal atrial fibrillation: Secondary | ICD-10-CM

## 2018-04-18 DIAGNOSIS — Z7901 Long term (current) use of anticoagulants: Secondary | ICD-10-CM | POA: Diagnosis not present

## 2018-04-18 DIAGNOSIS — Z5181 Encounter for therapeutic drug level monitoring: Secondary | ICD-10-CM

## 2018-04-18 DIAGNOSIS — I4891 Unspecified atrial fibrillation: Secondary | ICD-10-CM

## 2018-04-18 LAB — POCT INR: INR: 3.3 — AB (ref 2.0–3.0)

## 2018-04-18 NOTE — Telephone Encounter (Signed)
You can put him in a clinic visit with me next week, I should have openings, for a routine clinic visit. Please counsel him I will discuss the banding with him, he is high risk with use of a blood thinner. If he is sick or not feeling well I would recommend this be delayed. Given the virus crisis we are not doing elective bandings right now, only if he is having significant / bothersome symptoms. Thanks

## 2018-04-18 NOTE — Patient Instructions (Addendum)
Description   No warfarin today's dose, then start taking 1 tablet daily except 1/2 tablet each Mondays, Wednesdays and Fridays. Recheck INR in 2-3 weeks.

## 2018-04-18 NOTE — Telephone Encounter (Signed)
Patient contacted. He sees blood with his morning bowel movement every morning. Denies constipation. He says his bowel movements are "average." He states he is using hemorrhoid cream and "what is that supposed to do." Demanding to be scheduled for banding. Voices his dissatisfaction of the need to have his request reviewed by the provider. Very briefly explained that this is not a permanent situation. We will address his needs and continue to give him good medical care. Reassured him the provider will advise. Please help me with this.

## 2018-04-18 NOTE — Telephone Encounter (Signed)
Pt would like to schedule an appt with [name] for rectal bleeding.  He requested a CB.

## 2018-04-21 NOTE — Telephone Encounter (Signed)
Left message offering a virtual visit.

## 2018-04-21 NOTE — Telephone Encounter (Signed)
Can make him a virtual visit with me, can book in any opening tomorrow or later in the week. Thanks

## 2018-04-21 NOTE — Telephone Encounter (Signed)
Is this patient to be scheduled for an appointment in the office or virtual visit?

## 2018-04-24 ENCOUNTER — Ambulatory Visit (INDEPENDENT_AMBULATORY_CARE_PROVIDER_SITE_OTHER): Payer: Medicare Other | Admitting: *Deleted

## 2018-04-24 ENCOUNTER — Other Ambulatory Visit: Payer: Self-pay

## 2018-04-24 DIAGNOSIS — I255 Ischemic cardiomyopathy: Secondary | ICD-10-CM | POA: Diagnosis not present

## 2018-04-24 LAB — CUP PACEART REMOTE DEVICE CHECK
Battery Remaining Longevity: 91 mo
Battery Voltage: 2.98 V
Brady Statistic AP VP Percent: 83.97 %
Brady Statistic AP VS Percent: 1.51 %
Brady Statistic AS VP Percent: 14.14 %
Brady Statistic AS VS Percent: 0.39 %
Brady Statistic RA Percent Paced: 84.92 %
Brady Statistic RV Percent Paced: 0.6 %
Date Time Interrogation Session: 20200326052404
HIGH POWER IMPEDANCE MEASURED VALUE: 81 Ohm
HighPow Impedance: 48 Ohm
Implantable Lead Implant Date: 20010806
Implantable Lead Implant Date: 20180813
Implantable Lead Implant Date: 20180813
Implantable Lead Location: 753859
Implantable Lead Location: 753860
Implantable Lead Model: 5092
Implantable Lead Serial Number: 104581
Implantable Pulse Generator Implant Date: 20180813
Lead Channel Impedance Value: 195.429
Lead Channel Impedance Value: 198.837
Lead Channel Impedance Value: 198.837
Lead Channel Impedance Value: 232.653
Lead Channel Impedance Value: 237.5 Ohm
Lead Channel Impedance Value: 456 Ohm
Lead Channel Impedance Value: 456 Ohm
Lead Channel Impedance Value: 475 Ohm
Lead Channel Impedance Value: 475 Ohm
Lead Channel Impedance Value: 703 Ohm
Lead Channel Impedance Value: 703 Ohm
Lead Channel Impedance Value: 703 Ohm
Lead Channel Impedance Value: 722 Ohm
Lead Channel Impedance Value: 779 Ohm
Lead Channel Impedance Value: 836 Ohm
Lead Channel Pacing Threshold Amplitude: 0.625 V
Lead Channel Pacing Threshold Amplitude: 1.375 V
Lead Channel Pacing Threshold Amplitude: 1.375 V
Lead Channel Pacing Threshold Pulse Width: 0.4 ms
Lead Channel Pacing Threshold Pulse Width: 0.4 ms
Lead Channel Pacing Threshold Pulse Width: 0.4 ms
Lead Channel Sensing Intrinsic Amplitude: 14.75 mV
Lead Channel Sensing Intrinsic Amplitude: 14.75 mV
Lead Channel Sensing Intrinsic Amplitude: 2.625 mV
Lead Channel Sensing Intrinsic Amplitude: 2.625 mV
Lead Channel Setting Pacing Amplitude: 1.75 V
Lead Channel Setting Pacing Amplitude: 2 V
Lead Channel Setting Pacing Pulse Width: 0.4 ms
Lead Channel Setting Pacing Pulse Width: 0.4 ms
Lead Channel Setting Sensing Sensitivity: 0.3 mV
MDC IDC LEAD LOCATION: 753858
MDC IDC MSMT LEADCHNL LV IMPEDANCE VALUE: 342 Ohm
MDC IDC MSMT LEADCHNL LV IMPEDANCE VALUE: 722 Ohm
MDC IDC MSMT LEADCHNL RV IMPEDANCE VALUE: 703 Ohm
MDC IDC SET LEADCHNL RV PACING AMPLITUDE: 3.25 V

## 2018-04-29 ENCOUNTER — Encounter: Payer: Self-pay | Admitting: Cardiology

## 2018-04-29 NOTE — Progress Notes (Signed)
Remote ICD transmission.   

## 2018-05-05 ENCOUNTER — Ambulatory Visit (INDEPENDENT_AMBULATORY_CARE_PROVIDER_SITE_OTHER): Payer: Medicare Other

## 2018-05-05 ENCOUNTER — Other Ambulatory Visit: Payer: Self-pay

## 2018-05-05 DIAGNOSIS — I5022 Chronic systolic (congestive) heart failure: Secondary | ICD-10-CM

## 2018-05-05 DIAGNOSIS — Z95810 Presence of automatic (implantable) cardiac defibrillator: Secondary | ICD-10-CM | POA: Diagnosis not present

## 2018-05-05 NOTE — Progress Notes (Signed)
EPIC Encounter for ICM Monitoring  Patient Name: [name] is a 78 y.o. male Date: 05/05/2018 Primary Care Physican: [name], MD Primary Cardiologist:Kelly/Bensimhon Electrophysiologist: [name] BiV Pacing: 97.6% 05/05/2018 Weight: 140 - 141 lbs (baseline)   Heart Failure questions reviewed, pt asymptomatic.    Thoracic impedancenormal.  Prescribed:Torsemide 20 mg 1 tabletdaily. Potassium 10 mEq take 0.5 tablet daily.  Labs: 01/07/2018 Creatinine 0.88, BUN 12, Potassium 4.1, Sodium 136, eGFR >60 12/23/2017 Creatinine 0.91, BUN 17 07/11/2017 Creatinine 0.89, BUN 12, Potassium 3.5, Sodium 139, EGFR >60 01/01/2017 Creatinine0.79, BUN13, Potassium4.5, Sodium141, EGFR87-100 09/22/2016 Creatinine0.84, BUN13, Potassium3.8, Sodium135, EGFR>60  09/20/2016 Creatinine0.90, BUN19, Potassium4.1, Sodium138, [id number]  08/06/2018Creatinine 0.98, BUN12, Potassium3.4, Sodium137, [id number]  05/15/2018Creatinine 0.86, BUN14, Potassium3.9, Sodium140  02/19/2018Creatinine 1.13, BUN16, Potassium3.4, Sodium135, EGFR>60 A complete set of results can be found in Results Review  Recommendations:No changes. Encouraged to call for any fluid symptoms.  Follow-up plan: ICM clinic phone appointment on5/11/2018.   Copy of ICM check sent to [place].  3 month ICM trend: 05/05/2018    1 Year ICM trend:       [name], RN 05/05/2018 3:11 PM

## 2018-05-06 ENCOUNTER — Encounter (HOSPITAL_COMMUNITY): Payer: Medicare Other | Admitting: Internal Medicine

## 2018-05-06 DIAGNOSIS — J3089 Other allergic rhinitis: Secondary | ICD-10-CM | POA: Diagnosis not present

## 2018-05-06 DIAGNOSIS — J4540 Moderate persistent asthma, uncomplicated: Secondary | ICD-10-CM | POA: Diagnosis not present

## 2018-05-06 DIAGNOSIS — J301 Allergic rhinitis due to pollen: Secondary | ICD-10-CM | POA: Diagnosis not present

## 2018-05-06 DIAGNOSIS — J3081 Allergic rhinitis due to animal (cat) (dog) hair and dander: Secondary | ICD-10-CM | POA: Diagnosis not present

## 2018-05-08 ENCOUNTER — Telehealth: Payer: Self-pay

## 2018-05-08 DIAGNOSIS — H353211 Exudative age-related macular degeneration, right eye, with active choroidal neovascularization: Secondary | ICD-10-CM | POA: Diagnosis not present

## 2018-05-08 NOTE — Telephone Encounter (Signed)

## 2018-05-09 ENCOUNTER — Other Ambulatory Visit: Payer: Self-pay

## 2018-05-09 ENCOUNTER — Ambulatory Visit (INDEPENDENT_AMBULATORY_CARE_PROVIDER_SITE_OTHER): Payer: Medicare Other | Admitting: Pharmacist

## 2018-05-09 DIAGNOSIS — I4891 Unspecified atrial fibrillation: Secondary | ICD-10-CM

## 2018-05-09 DIAGNOSIS — Z5181 Encounter for therapeutic drug level monitoring: Secondary | ICD-10-CM | POA: Diagnosis not present

## 2018-05-09 DIAGNOSIS — Z7901 Long term (current) use of anticoagulants: Secondary | ICD-10-CM

## 2018-05-09 DIAGNOSIS — I480 Paroxysmal atrial fibrillation: Secondary | ICD-10-CM

## 2018-05-09 LAB — POCT INR: INR: 2.8 (ref 2.0–3.0)

## 2018-05-09 MED ORDER — APIXABAN 5 MG PO TABS: 5.0000 mg | ORAL_TABLET | Freq: Two times a day (BID) | ORAL | 5 refills | Status: DC

## 2018-05-13 ENCOUNTER — Encounter (HOSPITAL_COMMUNITY): Payer: Medicare Other | Admitting: Internal Medicine

## 2018-05-19 DIAGNOSIS — K219 Gastro-esophageal reflux disease without esophagitis: Secondary | ICD-10-CM | POA: Diagnosis not present

## 2018-05-19 DIAGNOSIS — I10 Essential (primary) hypertension: Secondary | ICD-10-CM | POA: Diagnosis not present

## 2018-05-19 DIAGNOSIS — F419 Anxiety disorder, unspecified: Secondary | ICD-10-CM | POA: Diagnosis not present

## 2018-05-19 DIAGNOSIS — K589 Irritable bowel syndrome without diarrhea: Secondary | ICD-10-CM | POA: Diagnosis not present

## 2018-05-19 DIAGNOSIS — I480 Paroxysmal atrial fibrillation: Secondary | ICD-10-CM | POA: Diagnosis not present

## 2018-05-19 DIAGNOSIS — I2510 Atherosclerotic heart disease of native coronary artery without angina pectoris: Secondary | ICD-10-CM | POA: Diagnosis not present

## 2018-05-19 DIAGNOSIS — J309 Allergic rhinitis, unspecified: Secondary | ICD-10-CM | POA: Diagnosis not present

## 2018-05-19 DIAGNOSIS — I5022 Chronic systolic (congestive) heart failure: Secondary | ICD-10-CM | POA: Diagnosis not present

## 2018-05-19 DIAGNOSIS — R300 Dysuria: Secondary | ICD-10-CM | POA: Diagnosis not present

## 2018-05-19 DIAGNOSIS — E782 Mixed hyperlipidemia: Secondary | ICD-10-CM | POA: Diagnosis not present

## 2018-05-20 ENCOUNTER — Other Ambulatory Visit: Payer: Self-pay | Admitting: Cardiovascular Disease

## 2018-05-20 NOTE — Telephone Encounter (Signed)
Simvastatin 40 mg refilled.

## 2018-05-21 ENCOUNTER — Telehealth: Payer: Self-pay | Admitting: Gastroenterology

## 2018-05-21 NOTE — Telephone Encounter (Signed)
Patient called going like to know if we can get him coupons and samples for align probiotic

## 2018-05-21 NOTE — Telephone Encounter (Signed)
I am sorry, we currently do not have any coupons or samples for Align. You could try on-line for a possible coupon.

## 2018-05-28 DIAGNOSIS — M25511 Pain in right shoulder: Secondary | ICD-10-CM | POA: Diagnosis not present

## 2018-05-28 DIAGNOSIS — M25561 Pain in right knee: Secondary | ICD-10-CM | POA: Diagnosis not present

## 2018-05-28 DIAGNOSIS — M1990 Unspecified osteoarthritis, unspecified site: Secondary | ICD-10-CM | POA: Diagnosis not present

## 2018-05-28 DIAGNOSIS — Z79899 Other long term (current) drug therapy: Secondary | ICD-10-CM | POA: Diagnosis not present

## 2018-05-28 DIAGNOSIS — I509 Heart failure, unspecified: Secondary | ICD-10-CM | POA: Diagnosis not present

## 2018-05-28 DIAGNOSIS — M109 Gout, unspecified: Secondary | ICD-10-CM | POA: Diagnosis not present

## 2018-05-28 DIAGNOSIS — M1120 Other chondrocalcinosis, unspecified site: Secondary | ICD-10-CM | POA: Diagnosis not present

## 2018-05-29 DIAGNOSIS — J449 Chronic obstructive pulmonary disease, unspecified: Secondary | ICD-10-CM | POA: Diagnosis not present

## 2018-05-29 DIAGNOSIS — I5023 Acute on chronic systolic (congestive) heart failure: Secondary | ICD-10-CM | POA: Diagnosis not present

## 2018-05-29 DIAGNOSIS — I10 Essential (primary) hypertension: Secondary | ICD-10-CM | POA: Diagnosis not present

## 2018-05-29 DIAGNOSIS — I5022 Chronic systolic (congestive) heart failure: Secondary | ICD-10-CM | POA: Diagnosis not present

## 2018-05-29 DIAGNOSIS — E782 Mixed hyperlipidemia: Secondary | ICD-10-CM | POA: Diagnosis not present

## 2018-05-29 DIAGNOSIS — N400 Enlarged prostate without lower urinary tract symptoms: Secondary | ICD-10-CM | POA: Diagnosis not present

## 2018-05-29 DIAGNOSIS — I480 Paroxysmal atrial fibrillation: Secondary | ICD-10-CM | POA: Diagnosis not present

## 2018-05-29 DIAGNOSIS — E7800 Pure hypercholesterolemia, unspecified: Secondary | ICD-10-CM | POA: Diagnosis not present

## 2018-05-29 DIAGNOSIS — I2510 Atherosclerotic heart disease of native coronary artery without angina pectoris: Secondary | ICD-10-CM | POA: Diagnosis not present

## 2018-05-29 DIAGNOSIS — J45909 Unspecified asthma, uncomplicated: Secondary | ICD-10-CM | POA: Diagnosis not present

## 2018-06-04 ENCOUNTER — Telehealth: Payer: Self-pay

## 2018-06-04 NOTE — Telephone Encounter (Signed)

## 2018-06-06 ENCOUNTER — Other Ambulatory Visit: Payer: Self-pay

## 2018-06-06 ENCOUNTER — Ambulatory Visit (INDEPENDENT_AMBULATORY_CARE_PROVIDER_SITE_OTHER): Payer: Medicare Other | Admitting: *Deleted

## 2018-06-06 DIAGNOSIS — Z5181 Encounter for therapeutic drug level monitoring: Secondary | ICD-10-CM | POA: Diagnosis not present

## 2018-06-06 DIAGNOSIS — Z7901 Long term (current) use of anticoagulants: Secondary | ICD-10-CM

## 2018-06-06 DIAGNOSIS — I4891 Unspecified atrial fibrillation: Secondary | ICD-10-CM

## 2018-06-06 DIAGNOSIS — I480 Paroxysmal atrial fibrillation: Secondary | ICD-10-CM | POA: Diagnosis not present

## 2018-06-06 LAB — POCT INR: INR: 2.8 (ref 2.0–3.0)

## 2018-06-06 NOTE — Patient Instructions (Addendum)
Spoke with pt continue taking 1 tablet daily except 1/2 tablet each Mondays, Wednesdays and Fridays. Recheck INR in 4 weeks.

## 2018-06-09 ENCOUNTER — Ambulatory Visit (INDEPENDENT_AMBULATORY_CARE_PROVIDER_SITE_OTHER): Payer: Medicare Other

## 2018-06-09 ENCOUNTER — Other Ambulatory Visit: Payer: Self-pay

## 2018-06-09 DIAGNOSIS — I5022 Chronic systolic (congestive) heart failure: Secondary | ICD-10-CM | POA: Diagnosis not present

## 2018-06-09 DIAGNOSIS — Z95810 Presence of automatic (implantable) cardiac defibrillator: Secondary | ICD-10-CM

## 2018-06-10 ENCOUNTER — Telehealth: Payer: Self-pay

## 2018-06-10 NOTE — Telephone Encounter (Signed)
Remote ICM transmission received.  Attempted call to patient regarding ICM remote transmission and no answer.  

## 2018-06-10 NOTE — Progress Notes (Signed)
EPIC Encounter for ICM Monitoring  Patient Name: [name] is a 78 y.o. male Date: 06/10/2018 Primary Care Physican: [name], MD Primary Cardiologist:Kelly/Bensimhon Electrophysiologist: [name] BiV Pacing: 97.4% 05/05/2018 Weight: 140- 141 lbs (baseline)   Attempted call to patient and unable to reach.   Transmission reviewed.    Thoracic impedancenormal.  Prescribed:Torsemide 20 mg 1 tabletdaily. Potassium 10 mEq take 0.5 tablet daily.  Labs: 01/07/2018 Creatinine 0.88, BUN 12, Potassium 4.1, Sodium 136, eGFR >60 12/23/2017 Creatinine 0.91, BUN 17 07/11/2017 Creatinine 0.89, BUN 12, Potassium 3.5, Sodium 139, EGFR >60 01/01/2017 Creatinine0.79, BUN13, Potassium4.5, Sodium141, [id number] 09/22/2016 Creatinine0.84, BUN13, Potassium3.8, Sodium135, EGFR>60  09/20/2016 Creatinine0.90, BUN19, Potassium4.1, Sodium138, [id number]  08/06/2018Creatinine 0.98, BUN12, Potassium3.4, Sodium137, [id number]  05/15/2018Creatinine 0.86, BUN14, Potassium3.9, Sodium140  02/19/2018Creatinine 1.13, BUN16, Potassium3.4, Sodium135, EGFR>60 A complete set of results can be found in Results Review  Recommendations:Unable to reach.  Follow-up plan: ICM clinic phone appointment on6/15/2020.   Copy of ICM check sent to [place].  3 month ICM trend: 06/09/2018    1 Year ICM trend:       [name], RN 06/10/2018 4:49 PM

## 2018-06-13 DIAGNOSIS — H43811 Vitreous degeneration, right eye: Secondary | ICD-10-CM | POA: Diagnosis not present

## 2018-06-17 ENCOUNTER — Telehealth: Payer: Self-pay | Admitting: Gastroenterology

## 2018-06-17 NOTE — Telephone Encounter (Signed)
ERROR ERROR

## 2018-06-17 NOTE — Telephone Encounter (Signed)
CORRECTED:  Pt called in and wanted to know if he can get more samples of the IBGaurd that was given to him. And he is also wanting to know if there were any coupons as well. Please call and advised.

## 2018-06-18 NOTE — Telephone Encounter (Signed)
Spoke with patient and told him that we currently do not have any IB Gard coupons but that I would leave several samples up front for him to pick up.  Patient agreed.

## 2018-06-20 DIAGNOSIS — Z79899 Other long term (current) drug therapy: Secondary | ICD-10-CM | POA: Diagnosis not present

## 2018-06-20 DIAGNOSIS — M25511 Pain in right shoulder: Secondary | ICD-10-CM | POA: Diagnosis not present

## 2018-06-20 DIAGNOSIS — M109 Gout, unspecified: Secondary | ICD-10-CM | POA: Diagnosis not present

## 2018-06-20 DIAGNOSIS — M25561 Pain in right knee: Secondary | ICD-10-CM | POA: Diagnosis not present

## 2018-06-20 DIAGNOSIS — R5383 Other fatigue: Secondary | ICD-10-CM | POA: Diagnosis not present

## 2018-06-20 DIAGNOSIS — I509 Heart failure, unspecified: Secondary | ICD-10-CM | POA: Diagnosis not present

## 2018-06-20 DIAGNOSIS — M1120 Other chondrocalcinosis, unspecified site: Secondary | ICD-10-CM | POA: Diagnosis not present

## 2018-06-20 DIAGNOSIS — M1990 Unspecified osteoarthritis, unspecified site: Secondary | ICD-10-CM | POA: Diagnosis not present

## 2018-07-01 ENCOUNTER — Other Ambulatory Visit: Payer: Self-pay | Admitting: Cardiovascular Disease

## 2018-07-01 DIAGNOSIS — I480 Paroxysmal atrial fibrillation: Secondary | ICD-10-CM

## 2018-07-02 ENCOUNTER — Telehealth: Payer: Self-pay

## 2018-07-02 NOTE — Telephone Encounter (Signed)

## 2018-07-07 ENCOUNTER — Other Ambulatory Visit: Payer: Self-pay

## 2018-07-07 ENCOUNTER — Ambulatory Visit (INDEPENDENT_AMBULATORY_CARE_PROVIDER_SITE_OTHER): Payer: Medicare Other | Admitting: *Deleted

## 2018-07-07 DIAGNOSIS — Z5181 Encounter for therapeutic drug level monitoring: Secondary | ICD-10-CM | POA: Diagnosis not present

## 2018-07-07 DIAGNOSIS — Z7901 Long term (current) use of anticoagulants: Secondary | ICD-10-CM

## 2018-07-07 DIAGNOSIS — Z961 Presence of intraocular lens: Secondary | ICD-10-CM | POA: Diagnosis not present

## 2018-07-07 DIAGNOSIS — I480 Paroxysmal atrial fibrillation: Secondary | ICD-10-CM

## 2018-07-07 DIAGNOSIS — I4891 Unspecified atrial fibrillation: Secondary | ICD-10-CM | POA: Diagnosis not present

## 2018-07-07 DIAGNOSIS — H353121 Nonexudative age-related macular degeneration, left eye, early dry stage: Secondary | ICD-10-CM | POA: Diagnosis not present

## 2018-07-07 DIAGNOSIS — H353211 Exudative age-related macular degeneration, right eye, with active choroidal neovascularization: Secondary | ICD-10-CM | POA: Diagnosis not present

## 2018-07-07 LAB — POCT INR: INR: 2.5 (ref 2.0–3.0)

## 2018-07-07 NOTE — Patient Instructions (Signed)
Description   Continue taking 1 tablet daily except 1/2 tablet each Mondays, Wednesdays and Fridays. Recheck INR in 5 weeks.

## 2018-07-10 NOTE — Progress Notes (Signed)
Patient prescreened for visit with [name] on 6-12. Pt requested an in office visit.  Covid-19 screening questions.  Patient answered NO to all questions:  Have you traveled in the last 14 days? If yes where?  Do you now or have you had a fever in the last 14 days?  Do you have any respiratory symptoms of shortness of breath or cough now or in the last 14 days?  Do you have any family members or close contacts with diagnosed or suspected Covid-19 in the past 14 days?  Have you been tested for Covid-19 and found to be positive?

## 2018-07-11 ENCOUNTER — Other Ambulatory Visit: Payer: Self-pay

## 2018-07-11 ENCOUNTER — Other Ambulatory Visit (HOSPITAL_COMMUNITY): Payer: Self-pay | Admitting: Internal Medicine

## 2018-07-11 ENCOUNTER — Ambulatory Visit (INDEPENDENT_AMBULATORY_CARE_PROVIDER_SITE_OTHER): Payer: Medicare Other | Admitting: Gastroenterology

## 2018-07-11 ENCOUNTER — Encounter: Payer: Self-pay | Admitting: Gastroenterology

## 2018-07-11 VITALS — BP 106/56 | HR 63 | Temp 98.6°F | Ht 67.0 in | Wt 141.5 lb

## 2018-07-11 DIAGNOSIS — K589 Irritable bowel syndrome without diarrhea: Secondary | ICD-10-CM

## 2018-07-11 DIAGNOSIS — R140 Abdominal distension (gaseous): Secondary | ICD-10-CM | POA: Diagnosis not present

## 2018-07-11 DIAGNOSIS — I255 Ischemic cardiomyopathy: Secondary | ICD-10-CM

## 2018-07-11 MED ORDER — AMBULATORY NON FORMULARY MEDICATION: 0 refills | Status: DC

## 2018-07-11 MED ORDER — RIFAXIMIN 550 MG PO TABS: 550.0000 mg | ORAL_TABLET | Freq: Three times a day (TID) | ORAL | 0 refills | Status: DC

## 2018-07-11 NOTE — Patient Instructions (Addendum)
If you are age 79 or older, your body mass index should be between 23-30. Your Body mass index is 22.16 kg/m. If this is out of the aforementioned range listed, please consider follow up with your Primary Care Provider.  If you are age 51 or younger, your body mass index should be between 19-25. Your Body mass index is 22.16 kg/m. If this is out of the aformentioned range listed, please consider follow up with your Primary Care Provider.   To help prevent the possible spread of infection to our patients, communities, and staff; we will be implementing the following measures:  As of now we are not allowing any visitors/family members to accompany you to any upcoming appointments with [facility]. If you have any concerns about this please contact our office to discuss prior to the appointment.   We have given you samples of the following medication to take: FDgard: Use as directed  We have sent your demographic information and a prescription for Xifaxan to [facility]. This pharmacy is able to get medication approved through insurance and get you the lowest copay possible. If you have not heard from them within 1 week, please call our office at [phone] to let us know. Xifaxan 550mg : Take three times a day for 14 days    Thank you for entrusting me with your care and for choosing [employer], Dr. [place] Cellar

## 2018-07-11 NOTE — Progress Notes (Signed)
HPI :  78 y/o male here for a follow up visit. He has a history of CAD, NSVT, history of CHF, on coumadin, rectal bleeding due to hemorrhoids, IBS, here for a follow up.  His main complaint is bloating and abdominal distension. Continues to bother him and has done so for a bit of time now. He had a CT Scan in February which did not show any acute or concerning pathology to cause this. His prostate was enlarged and he states he saw a Dealer. Diverticulosis without diverticulitis. He's been taking bentyl which helps somewhat, and IB gard which also does provide some benefit but he thinks makes his reflux worse. HE continues to have postprandial bloating and abdominal distensoin. He states he gets distended and then his stomach flattens out and he feels better. He has used a low FODMAP diet which has not helped much. He does find some relief of his symptoms with a bowel movement. He has 1-2 BMs per day, he denies constipation. He is not having any rectal bleeding at baseline. He has had severe bleeding in the past due to hemorrhoids which were banded x 1 and mostly resolved this. He has had mild sporadic bleeding since then.   He has declined colonoscopy in the past, has had screening with colo-guard in both 2015 and 2018 which were both negative. He has no family history of colon cancer. We had discussed colonoscopy in light of his bleeding symptoms in the past. He has been avoiding endoscopic evaluations given his cardiac history.He's never had a prior colonoscopy.  He previously had an ultrasound of his right upper quadrant in May 2017 which showed distended gallbladder with some wall thickening at the time without cholelithiasis. He had a follow-up HIDA scan which showed no evidence of acute cholecystitis. GI series last  Year showed some minimal nonspecific esophageal dysmotility with some mild reflux, and a small duodenal diverticulum, otherwise normal   CT scan 03/06/2018 - IMPRESSION: 1. No  acute findings. 2. Mildly enlarged prostate, with asymmetric enlargement of left seminal vesicle. 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 Recommend correlation with PSA level, and consider prostate MRI for further evaluation if clinically warranted. 3. Large left posterior bladder diverticulum. 4. Colonic diverticulosis, without radiographic evidence of diverticulitis. 5. Small left inguinal hernia containing only fat. 6. Cardiomegaly and findings consistent with right heart insufficiency.   Past Medical History:  Diagnosis Date  . Acute on chronic systolic CHF (congestive heart failure) ([place]) 06/09/2015  . 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 Cardiomyopathy, ischemic 2011   with EF 25-35% by echo  . Coronary artery disease    Hx MI 1992, CABG 1998 , Nuc study 11.2013 large scar but no ischemia  . GERD (gastroesophageal reflux disease)   . H. pylori infection    Hx of   . 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 NSVT (nonsustained ventricular tachycardia) ([place])   . Paroxysmal atrial fibrillation [facility])      Past Surgical History:  Procedure Laterality Date  . BIV UPGRADE N/A 09/10/2016   Procedure: BiV ICD Upgrade;  Surgeon: Deboraha Sprang, MD;  Location: [facility] [facility];  Service: Cardiovascular;  Laterality: N/A;  . CATARACT EXTRACTION, BILATERAL    . CORONARY ARTERY BYPASS GRAFT  1998   x 5  . HERNIA REPAIR  1960's   right inguinal  . ICD  GENERATOR CHANGE  01/2008, 2018   medtronic, hx+ EP study  . KNEE SURGERY     right  . NASAL SINUS SURGERY  05/31/2010   Dr. Benjamine Mola   Family History  Problem Relation Age of Onset  . Heart disease Father        questionable  . Stroke Mother   . Heart failure Sister   . Healthy Brother   . Healthy Sister   . Parkinsonism Brother   . Colon cancer Neg Hx    Social History   Tobacco Use  .  Smoking status: Never Smoker  . Smokeless tobacco: Never Used  Substance Use Topics  . Alcohol use: Yes    Alcohol/week: 2.0 standard drinks    Types: 2 Glasses of wine per week    Comment: socially  . Drug use: No   Current Outpatient Medications  Medication Sig Dispense Refill  . acetaminophen (TYLENOL) 650 MG CR tablet Take 650 mg by mouth 2 (two) times daily.     Marland Kitchen allopurinol (ZYLOPRIM) 300 MG tablet Take 300 mg by mouth daily.    . AMBULATORY NON FORMULARY MEDICATION Medication Name: Hydrocortisone cream 1%: Apply to the rectum once a day for a week, then as needed (Patient not taking: Reported on 07/10/2018)    . AMBULATORY NON FORMULARY MEDICATION IBgard- Take as directed 16 capsule 0  . bisoprolol (ZEBETA) 5 MG tablet TAKE 1/2 (ONE-HALF) TABLET BY MOUTH ONCE DAILY 45 tablet 1  . cetirizine (ZYRTEC) 10 MG tablet Take 10 mg by mouth daily.      . Cholecalciferol (VITAMIN D) 2000 units CAPS Take 1-2 capsules by mouth daily.     . colchicine 0.6 MG tablet Take 0.6 mg by mouth daily as needed.    . dicyclomine (BENTYL) 10 MG capsule TAKE 1 CAPSULE BY MOUTH EVERY 8 HOURS AS NEEDED FOR SPASMS 90 capsule 3  . isosorbide mononitrate (IMDUR) 30 MG 24 hr tablet Take 15 mg by mouth daily.    Marland Kitchen LORazepam (ATIVAN) 1 MG tablet Take 0.25-0.5 mg by mouth as needed for sleep.     Marland Kitchen losartan (COZAAR) 25 MG tablet TAKE 1 TABLET BY MOUTH TWICE A DAY 180 tablet 1  . magnesium oxide (MAG-OX) 400 MG tablet Take 200 mg by mouth daily at 12 noon.     . mometasone (ASMANEX) 220 MCG/INH inhaler Inhale 1 puff into the lungs daily.     . montelukast (SINGULAIR) 10 MG tablet Take 1 tablet (10 mg total) by mouth at bedtime. PLEASE SCHEDULE APPOINTMENT. 30 tablet 0  . nitroGLYCERIN (NITROSTAT) 0.4 MG SL tablet Place 1 tablet (0.4 mg total) under the tongue every 5 (five) minutes as needed for chest pain. 25 tablet 1  . omeprazole (PRILOSEC) 20 MG capsule Take 1 capsule (20 mg total) by mouth every morning. 90  capsule 3  . Polyethyl Glycol-Propyl Glycol (SYSTANE OP) Apply 2-3 drops to eye 3 (three) times daily as needed (dry eyes).    . potassium chloride (K-DUR,KLOR-CON) 10 MEQ tablet Take 0.5 tablets (5 mEq total) by mouth daily. 45 tablet 3  . silodosin (RAPAFLO) 8 MG CAPS capsule Take 8 mg by mouth every other day.    . simvastatin (ZOCOR) 40 MG tablet TAKE 1 TABLET BY MOUTH AT BEDTIME 90 tablet 0  . spironolactone (ALDACTONE) 25 MG tablet Take 1 tablet (25 mg total) by mouth daily. 90 tablet 2  . torsemide (DEMADEX) 20 MG tablet Take 1 tablet (20 mg total) by mouth  daily. 90 tablet 3  . warfarin (COUMADIN) 5 MG tablet TAKE 1/2 TO 1 (ONE-HALF TO ONE) TABLET BY MOUTH ONCE DAILY AS DIRECTED BY  COUMADIN  CLINIC 90 tablet 0   No current facility-administered medications for this visit.    Allergies  Allergen Reactions  . Amoxicillin Rash    Has patient had a PCN reaction causing immediate rash, facial/tongue/throat swelling, SOB or lightheadedness with hypotension: Yes Has patient had a PCN reaction causing severe rash involving mucus membranes or skin necrosis: No Has patient had a PCN reaction that required hospitalization: No Has patient had a PCN reaction occurring within the last 10 years: No If all of the above answers are "NO", then may proceed with Cephalosporin use.   Marland Kitchen Penicillins Rash    Has patient had a PCN reaction causing immediate rash, facial/tongue/throat swelling, SOB or lightheadedness with hypotension: Yes Has patient had a PCN reaction causing severe rash involving mucus membranes or skin necrosis: No Has patient had a PCN reaction that required hospitalization: No Has patient had a PCN reaction occurring within the last 10 years: No If all of the above answers are "NO", then may proceed with Cephalosporin use.      Review of Systems: All systems reviewed and negative except where noted in HPI.   Lab Results  Component Value Date   WBC 9.7 03/24/2018   HGB 14.4  03/24/2018   HCT 46.3 03/24/2018   MCV 91.7 03/24/2018   PLT 261 03/24/2018    Lab Results  Component Value Date   CREATININE 1.00 03/24/2018   BUN 15 03/24/2018   NA 136 03/24/2018   K 3.7 03/24/2018   CL 100 03/24/2018   CO2 28 03/24/2018    Lab Results  Component Value Date   ALT 16 03/24/2018   AST 24 03/24/2018   ALKPHOS 67 03/24/2018   BILITOT 1.1 03/24/2018     Physical Exam: BP (!) 106/56   Pulse 63   Temp 98.6 F (37 C)   Ht 5\' 7"  (1.702 m)   Wt 141 lb 8 oz (64.2 kg)   BMI 22.16 kg/m  Constitutional: Pleasant,well-developed, male in no acute distress. HEENT: Normocephalic and atraumatic. Conjunctivae are normal. No scleral icterus. Neck supple.  Cardiovascular: Normal rate, regular rhythm.  Pulmonary/chest: Effort normal and breath sounds normal. No wheezing, rales or rhonchi. Abdominal: Soft, nondistended, nontender.  There are no masses palpable. No hepatomegaly. Extremities: no edema Lymphadenopathy: No cervical adenopathy noted. Neurological: Alert and oriented to person place and time. Skin: Skin is warm and dry. No rashes noted. Psychiatric: Normal mood and affect. Behavior is normal.   ASSESSMENT AND PLAN: 78 y/o male here for reassessment of the following issues:  IBS / bloating - he's had an extensive workup for his symptoms, other than a colonoscopy, which have not shown any concerning pathology. Main symptoms are bloating / abdominal distension, I suspect from IBS. He has declined colonoscopy in the past and continues to want to avoid it. I discussed options with him. Will give him a trial of Rifaximin 550mg  TID for 2 weeks, I think this will help his bloating / gas. He wants a trial of FD gard, I discussed with him that is used for dyspepsia and I don't think would provide as much benefit here but he wants to try it, free sample given. I asked him to keep an eye on things and let me know how he is doing in a few weeks. If his symptoms persist,  I  will discuss colonoscopy again with him, he has strongly wanted to avoid it, and I agree that his labs and imaging and stool testing argue against a concerning process in his colonoscopy, but that is the last test to evaluate if he continues to have symptoms despite multiple regimens of treating suspected functional symptoms. He agreed.   [place] Cellar, MD [facility]

## 2018-07-14 ENCOUNTER — Ambulatory Visit (INDEPENDENT_AMBULATORY_CARE_PROVIDER_SITE_OTHER): Payer: Medicare Other

## 2018-07-14 ENCOUNTER — Telehealth: Payer: Self-pay

## 2018-07-14 DIAGNOSIS — I5022 Chronic systolic (congestive) heart failure: Secondary | ICD-10-CM

## 2018-07-14 DIAGNOSIS — Z95810 Presence of automatic (implantable) cardiac defibrillator: Secondary | ICD-10-CM

## 2018-07-14 NOTE — Telephone Encounter (Signed)
refaxed to [phone] [facility]

## 2018-07-14 NOTE — Telephone Encounter (Signed)
Office note and insurance card sent to [facility] for [employer] sent on 6-12

## 2018-07-14 NOTE — Progress Notes (Signed)
EPIC Encounter for ICM Monitoring  Patient Name: TAVIEN CHESTNUT is a 78 y.o. male Date: 07/14/2018 Primary Care Physican: [name], MD Primary Cardiologist:Kelly/Bensimhon Electrophysiologist: [name] BiV Pacing: 96.6% 4/6/2020Weight: 140- 141lbs(baseline)    Transmission reviewed.    Thoracic impedancesuggesting possible fluid accumulation starting 5/28 and is ongoing but starting to trend back toward baseline.  Prescribed:Torsemide 20 mg 1 tabletdaily. Potassium 10 mEq take 0.5 tablet daily.  Labs: 01/07/2018 Creatinine 0.88, BUN 12, Potassium 4.1, Sodium 136, eGFR >60 12/23/2017 Creatinine 0.91, BUN 17 07/11/2017 Creatinine 0.89, BUN 12, Potassium 3.5, Sodium 139, EGFR >60 01/01/2017 Creatinine0.79, BUN13, Potassium4.5, Sodium141, [id number] 09/22/2016 Creatinine0.84, BUN13, Potassium3.8, Sodium135, EGFR>60  09/20/2016 Creatinine0.90, BUN19, Potassium4.1, Sodium138, [id number]  08/06/2018Creatinine 0.98, BUN12, Potassium3.4, Sodium137, [id number]  05/15/2018Creatinine 0.86, BUN14, Potassium3.9, Sodium140  02/19/2018Creatinine 1.13, BUN16, Potassium3.4, Sodium135, EGFR>60 A complete set of results can be found in Results Review  Recommendations will be given at [place] with Dr [name] 07/15/2018.  Follow-up plan: ICM clinic phone appointment on6/25/2020 to recheck fluid levels.   Copy of ICM check sent to Dr.Klein and Dr [name].  3 month ICM trend: 07/14/2018    1 Year ICM trend:       [name], RN 07/14/2018 4:55 PM

## 2018-07-14 NOTE — Telephone Encounter (Signed)
Katie from [facility] reported that prescription for Xifaxan has been received--she requested chart note for RX PA.   Fax: [phone]  Phone: [phone]

## 2018-07-15 ENCOUNTER — Encounter (HOSPITAL_COMMUNITY): Payer: Self-pay | Admitting: Internal Medicine

## 2018-07-15 ENCOUNTER — Other Ambulatory Visit: Payer: Self-pay

## 2018-07-15 ENCOUNTER — Ambulatory Visit (HOSPITAL_COMMUNITY)
Admission: RE | Admit: 2018-07-15 | Discharge: 2018-07-15 | Disposition: A | Discharge status: Home / Self Care | Payer: Medicare Other | Source: Ambulatory Visit | Attending: Internal Medicine | Admitting: Internal Medicine

## 2018-07-15 VITALS — BP 122/80 | HR 87 | Wt 144.2 lb

## 2018-07-15 DIAGNOSIS — K219 Gastro-esophageal reflux disease without esophagitis: Secondary | ICD-10-CM | POA: Diagnosis not present

## 2018-07-15 DIAGNOSIS — I5022 Chronic systolic (congestive) heart failure: Secondary | ICD-10-CM | POA: Diagnosis not present

## 2018-07-15 DIAGNOSIS — Z820 Family history of epilepsy and other diseases of the nervous system: Secondary | ICD-10-CM | POA: Diagnosis not present

## 2018-07-15 DIAGNOSIS — Z79899 Other long term (current) drug therapy: Secondary | ICD-10-CM | POA: Insufficient documentation

## 2018-07-15 DIAGNOSIS — Z823 Family history of stroke: Secondary | ICD-10-CM | POA: Insufficient documentation

## 2018-07-15 DIAGNOSIS — I255 Ischemic cardiomyopathy: Secondary | ICD-10-CM | POA: Diagnosis not present

## 2018-07-15 DIAGNOSIS — Z880 Allergy status to penicillin: Secondary | ICD-10-CM | POA: Diagnosis not present

## 2018-07-15 DIAGNOSIS — I252 Old myocardial infarction: Secondary | ICD-10-CM | POA: Diagnosis not present

## 2018-07-15 DIAGNOSIS — I5032 Chronic diastolic (congestive) heart failure: Secondary | ICD-10-CM

## 2018-07-15 DIAGNOSIS — I4891 Unspecified atrial fibrillation: Secondary | ICD-10-CM | POA: Diagnosis present

## 2018-07-15 DIAGNOSIS — I447 Left bundle-branch block, unspecified: Secondary | ICD-10-CM | POA: Diagnosis not present

## 2018-07-15 DIAGNOSIS — Z7951 Long term (current) use of inhaled steroids: Secondary | ICD-10-CM | POA: Insufficient documentation

## 2018-07-15 DIAGNOSIS — Z8249 Family history of ischemic heart disease and other diseases of the circulatory system: Secondary | ICD-10-CM | POA: Diagnosis not present

## 2018-07-15 DIAGNOSIS — K589 Irritable bowel syndrome without diarrhea: Secondary | ICD-10-CM | POA: Insufficient documentation

## 2018-07-15 DIAGNOSIS — I2510 Atherosclerotic heart disease of native coronary artery without angina pectoris: Secondary | ICD-10-CM

## 2018-07-15 DIAGNOSIS — I480 Paroxysmal atrial fibrillation: Secondary | ICD-10-CM | POA: Diagnosis not present

## 2018-07-15 DIAGNOSIS — Z95810 Presence of automatic (implantable) cardiac defibrillator: Secondary | ICD-10-CM | POA: Diagnosis not present

## 2018-07-15 DIAGNOSIS — Z951 Presence of aortocoronary bypass graft: Secondary | ICD-10-CM | POA: Insufficient documentation

## 2018-07-15 DIAGNOSIS — Z7901 Long term (current) use of anticoagulants: Secondary | ICD-10-CM | POA: Diagnosis not present

## 2018-07-15 NOTE — Progress Notes (Addendum)
Patient ID: Jason [name] [name], male   DOB: 25-Jun-1940, 78 y.o.   MRN: [id number]    Advanced Heart Failure Clinic Note   Date:  07/15/2018   ID:  Jason [name], [name] October 04, 1940, MRN [id number]  PCP:  [name], MD  Primary Cardiologist:  [name] Referring: Dr. [name]   History of Present Illness: Jason [name] [name] is a 78 y.o. 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 In September 1998 he underwent CABG revascularization surgery after an unsuccessful attempt at stenting of his proximal LAD by [name]. Ejection fraction was 25-30%. In 2002 he underwent initial ICD implantation for nonsustained ventricular tachycardia documented on event monitor for primary prevention. In January 2010 he underwent generator change with a Medtronic Virtuoso single chamber cardioverter defibrillator.  A nuclear perfusion study in November 2013 showed a large area of scar in the LAD territory (extent 44%) involving the mid to apical anterior,apical and infero-apical to mid infero-septal and apical lateral wall without associated ischemia.  He is on Coumadin anticoagulation for PAF. He was readmitted to the hospital from a May 2 through 06/04/2014 with recurrent atrial fibrillation and started on Tikosyn. He was enrolled in the genetic AF trial (bucindolol vs Toprol). He did poorly in the trial in the setting of titration of beta-blocker. He was referred to HF Clinic. Genetic AF study completed for pt. He is now on bisoprol 2.5 mg daily.   He has been discussing CRT upgrade with Dr. 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    He presents today for follow up. Overall feeling good. Main issue is that he has been struggling with IBS. Gets bloated and has a lot of gas after eating. Occasional BRBPR due to hemorrhoids. No evidence of  ischemic colitis.  CT scans and other w/u negative. Remains active with 5,000-7,000 steps per day without a problem. No CP   ICD  interrogated: No VT/AF. 100% v-pacing. Fluid normal Personally reviewed   Echo 04/04/17 reviewed by Dr [name]. EF 20% with mild to mod reduction RV function.   CPX 02/17/16 Resting HR: 73 Peak HR: 108  (75% age predicted max HR) BP rest: 110/58 BP peak: 150/56 Peak VO2: 14.4 (58% predicted peak VO2) VE/VCO2 slope: 33 OUES: 1.32 Peak RER: 1.10 PETCO2 at peak: 33 O2pulse: 9  (82% predicted O2pulse)  Echo 12/17 EF 15-20%, Moderate MR, RV with reduced function Echo 5/17 EF 20% RV mildly down.  Echo 3/19: EF 20%, RV mild to moderate reduced function    Labs 10/04/15 K 5.3, Creatinine 0.85, BUN 15  Past Medical History:  Diagnosis Date  . Acute on chronic systolic CHF (congestive heart failure) ([place]) 06/09/2015  . 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 Cardiomyopathy, ischemic 2011   with EF 25-35% by echo  . Coronary artery disease    Hx MI 1992, CABG 1998 , Nuc study 11.2013 large scar but no ischemia  . GERD (gastroesophageal reflux disease)   . H. pylori infection    Hx of   . 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 NSVT (nonsustained ventricular tachycardia) ([place])   . Paroxysmal atrial fibrillation ([facility])     Current Outpatient Medications  Medication Sig Dispense Refill  . acetaminophen (TYLENOL) 650 MG  CR tablet Take 650 mg by mouth 2 (two) times daily.     Marland Kitchen allopurinol (ZYLOPRIM) 300 MG tablet Take 300 mg by mouth daily.    . AMBULATORY NON FORMULARY MEDICATION Medication Name: Hydrocortisone cream 1%: Apply to the rectum once a day for a week, then as needed    . AMBULATORY NON FORMULARY MEDICATION IBgard- Take as directed 16 capsule 0  . AMBULATORY NON FORMULARY MEDICATION Medication Name: FDgard - use as directed 12 capsule 0  . bisoprolol  (ZEBETA) 5 MG tablet TAKE 1/2 (ONE-HALF) TABLET BY MOUTH ONCE DAILY 45 tablet 1  . cetirizine (ZYRTEC) 10 MG tablet Take 10 mg by mouth daily.      . Cholecalciferol (VITAMIN D) 2000 units CAPS Take 1-2 capsules by mouth daily.     Marland Kitchen dicyclomine (BENTYL) 10 MG capsule TAKE 1 CAPSULE BY MOUTH EVERY 8 HOURS AS NEEDED FOR SPASMS 90 capsule 3  . isosorbide mononitrate (IMDUR) 30 MG 24 hr tablet Take 15 mg by mouth daily.    Marland Kitchen LORazepam (ATIVAN) 1 MG tablet Take 0.25-0.5 mg by mouth as needed for sleep.     Marland Kitchen losartan (COZAAR) 25 MG tablet TAKE 1 TABLET BY MOUTH TWICE A DAY 180 tablet 1  . magnesium oxide (MAG-OX) 400 MG tablet Take 200 mg by mouth daily at 12 noon.     . mometasone (ASMANEX) 220 MCG/INH inhaler Inhale 1 puff into the lungs daily.     . montelukast (SINGULAIR) 10 MG tablet Take 1 tablet (10 mg total) by mouth at bedtime. PLEASE SCHEDULE APPOINTMENT. 30 tablet 0  . nitroGLYCERIN (NITROSTAT) 0.4 MG SL tablet Place 1 tablet (0.4 mg total) under the tongue every 5 (five) minutes as needed for chest pain. 25 tablet 1  . omeprazole (PRILOSEC) 20 MG capsule Take 1 capsule (20 mg total) by mouth every morning. 90 capsule 3  . Polyethyl Glycol-Propyl Glycol (SYSTANE OP) Apply 2-3 drops to eye 3 (three) times daily as needed (dry eyes).    . potassium chloride (K-DUR,KLOR-CON) 10 MEQ tablet Take 0.5 tablets (5 mEq total) by mouth daily. 45 tablet 3  . silodosin (RAPAFLO) 8 MG CAPS capsule Take 8 mg by mouth every other day.    . simvastatin (ZOCOR) 40 MG tablet TAKE 1 TABLET BY MOUTH AT BEDTIME 90 tablet 0  . spironolactone (ALDACTONE) 25 MG tablet Take 1 tablet (25 mg total) by mouth daily. 90 tablet 2  . torsemide (DEMADEX) 20 MG tablet Take 1 tablet (20 mg total) by mouth daily. 90 tablet 3  . warfarin (COUMADIN) 5 MG tablet TAKE 1/2 TO 1 (ONE-HALF TO ONE) TABLET BY MOUTH ONCE DAILY AS DIRECTED BY  COUMADIN  CLINIC 90 tablet 0   No current facility-administered medications for this  encounter.     Allergies:    Allergies  Allergen Reactions  . Amoxicillin Rash    Has patient had a PCN reaction causing immediate rash, facial/tongue/throat swelling, SOB or lightheadedness with hypotension: Yes Has patient had a PCN reaction causing severe rash involving mucus membranes or skin necrosis: No Has patient had a PCN reaction that required hospitalization: No Has patient had a PCN reaction occurring within the last 10 years: No If all of the above answers are "NO", then may proceed with Cephalosporin use.   Marland Kitchen Penicillins Rash    Has patient had a PCN reaction causing immediate rash, facial/tongue/throat swelling, SOB or lightheadedness with hypotension: Yes Has patient had a PCN reaction causing severe rash  involving mucus membranes or skin necrosis: No Has patient had a PCN reaction that required hospitalization: No Has patient had a PCN reaction occurring within the last 10 years: No If all of the above answers are "NO", then may proceed with Cephalosporin use.     Social History:  The patient  reports that he has never smoked. He has never used smokeless tobacco. He reports current alcohol use of about 2.0 standard drinks of alcohol per week. He reports that he does not use drugs.   Family history:   Family History  Problem Relation Age of Onset  . Heart disease Father        questionable  . Stroke Mother   . Heart failure Sister   . Healthy Brother   . Healthy Sister   . Parkinsonism Brother   . Colon cancer Neg Hx    Review of systems complete and found to be negative unless listed in HPI.    PHYSICAL EXAM: Vitals:   07/15/18 1123  BP: 122/80  Pulse: 87  SpO2: 98%  Weight: 65.4 kg (144 lb 2.9 oz)   Wt Readings from Last 3 Encounters:  07/15/18 65.4 kg (144 lb 2.9 oz)  07/11/18 64.2 kg (141 lb 8 oz)  04/09/18 67.1 kg (148 lb)   General:  Well appearing. No resp difficulty HEENT: normal Neck: supple. JVP 6 Carotids 2+ bilat; no bruits. No  lymphadenopathy or thryomegaly appreciated. Cor: PMI nondisplaced. Regular rate & rhythm. No rubs, gallops or murmurs. Lungs: clear Abdomen: soft, nontender, nondistended. No hepatosplenomegaly. No bruits or masses. Good bowel sounds. Extremities: no cyanosis, clubbing, rash, edema Neuro: alert & orientedx3, cranial nerves grossly intact. moves all 4 extremities w/o difficulty. Affect pleasant   ASSESSMENT AND PLAN:  1. 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  - Volume status stable on exam - Rash after taking Entresto.  - Continue losartan 25 bid - Continue torsemide at 20 daily  - Continue spiro 25 mg daily - Continue bisoprolol 2.5 mg daily. Unable to tolerate higher.  - BMET stable from 5/22 creatinine at 1.9  2. PAF - Regular on exam and ICD - Continue coumadin. Does not want Eliquis due to cost.   3. CAD s/p previous anterior infarct - No s/s of ischemia - Off ASA due to warfarin. Continue statin. - Recent LDL 76 in 1/19  4. LBBB - Now s/p CRT. No change.   5. IBS - symptoms sound classic for IBS. Doubt low flow HF or ischemic colitis.  - following with GI  Glori Bickers, MD  12:09 PM

## 2018-07-15 NOTE — Patient Instructions (Signed)
No medication changes today.  Your physician recommends that you schedule a follow-up appointment in: 6 months.  We will call you to schedule your appointment with Dr [name]

## 2018-07-15 NOTE — Addendum Note (Signed)
Encounter addended by: Jolaine Artist, MD on: 07/15/2018 12:21 PM  Actions taken: Clinical Note Signed

## 2018-07-16 DIAGNOSIS — J338 Other polyp of sinus: Secondary | ICD-10-CM | POA: Diagnosis not present

## 2018-07-16 DIAGNOSIS — J310 Chronic rhinitis: Secondary | ICD-10-CM | POA: Diagnosis not present

## 2018-07-16 DIAGNOSIS — H6123 Impacted cerumen, bilateral: Secondary | ICD-10-CM | POA: Diagnosis not present

## 2018-07-21 NOTE — Telephone Encounter (Signed)
Patient called said that on his visit with [name] he was told to call us if he doesn't hear anything from [facility] and is calling to get an update.

## 2018-07-21 NOTE — Telephone Encounter (Signed)
Called [facility]. They have everything they need.  His insurance is just taking little longer than usual. They will reach out to pt when they know.  Called and let pt know update. He expressed understanding.

## 2018-07-22 NOTE — Telephone Encounter (Signed)
Pt stated that he received a call that stated that rifaximin is over $500.  He would like a call back to discuss.

## 2018-07-23 NOTE — Telephone Encounter (Signed)
Called and LM for pt that I received his call and understand that Xifaxan is $500 out of pocket for him. [name] has repeatedly suggested colonoscopy but he has declined.

## 2018-07-23 NOTE — Telephone Encounter (Signed)
Pt's insurance called and stated that Xifaxan has been approved, but pt has a very high copay through Medicare, so the medication will be over $600 for him.

## 2018-07-24 ENCOUNTER — Ambulatory Visit (INDEPENDENT_AMBULATORY_CARE_PROVIDER_SITE_OTHER): Payer: Medicare Other | Admitting: *Deleted

## 2018-07-24 ENCOUNTER — Ambulatory Visit (INDEPENDENT_AMBULATORY_CARE_PROVIDER_SITE_OTHER): Payer: Medicare Other

## 2018-07-24 DIAGNOSIS — I255 Ischemic cardiomyopathy: Secondary | ICD-10-CM

## 2018-07-24 DIAGNOSIS — Z95810 Presence of automatic (implantable) cardiac defibrillator: Secondary | ICD-10-CM

## 2018-07-24 DIAGNOSIS — I5032 Chronic diastolic (congestive) heart failure: Secondary | ICD-10-CM

## 2018-07-24 LAB — CUP PACEART REMOTE DEVICE CHECK
Battery Remaining Longevity: 83 mo
Battery Voltage: 2.98 V
Brady Statistic AP VP Percent: 89.64 %
Brady Statistic AP VS Percent: 1.51 %
Brady Statistic AS VP Percent: 8.57 %
Brady Statistic AS VS Percent: 0.28 %
Brady Statistic RA Percent Paced: 90.05 %
Brady Statistic RV Percent Paced: 0.52 %
Date Time Interrogation Session: 20200625083624
HighPow Impedance: 45 Ohm
HighPow Impedance: 71 Ohm
Implantable Lead Implant Date: 20010806
Implantable Lead Implant Date: 20180813
Implantable Lead Implant Date: 20180813
Implantable Lead Location: 753858
Implantable Lead Location: 753859
Implantable Lead Location: 753860
Implantable Lead Model: 147
Implantable Lead Model: 5092
Implantable Lead Serial Number: 104581
Implantable Pulse Generator Implant Date: 20180813
Lead Channel Impedance Value: 184.154
Lead Channel Impedance Value: 188.1 Ohm
Lead Channel Impedance Value: 188.1 Ohm
Lead Channel Impedance Value: 204.14 Ohm
Lead Channel Impedance Value: 209 Ohm
Lead Channel Impedance Value: 342 Ohm
Lead Channel Impedance Value: 399 Ohm
Lead Channel Impedance Value: 418 Ohm
Lead Channel Impedance Value: 418 Ohm
Lead Channel Impedance Value: 456 Ohm
Lead Channel Impedance Value: 608 Ohm
Lead Channel Impedance Value: 646 Ohm
Lead Channel Impedance Value: 665 Ohm
Lead Channel Impedance Value: 665 Ohm
Lead Channel Impedance Value: 665 Ohm
Lead Channel Impedance Value: 703 Ohm
Lead Channel Impedance Value: 703 Ohm
Lead Channel Impedance Value: 760 Ohm
Lead Channel Pacing Threshold Amplitude: 0.625 V
Lead Channel Pacing Threshold Amplitude: 1.375 V
Lead Channel Pacing Threshold Amplitude: 1.375 V
Lead Channel Pacing Threshold Pulse Width: 0.4 ms
Lead Channel Pacing Threshold Pulse Width: 0.4 ms
Lead Channel Pacing Threshold Pulse Width: 0.4 ms
Lead Channel Sensing Intrinsic Amplitude: 2.25 mV
Lead Channel Sensing Intrinsic Amplitude: 9.875 mV
Lead Channel Setting Pacing Amplitude: 1.75 V
Lead Channel Setting Pacing Amplitude: 2 V
Lead Channel Setting Pacing Amplitude: 2.75 V
Lead Channel Setting Pacing Pulse Width: 0.4 ms
Lead Channel Setting Pacing Pulse Width: 0.4 ms
Lead Channel Setting Sensing Sensitivity: 0.3 mV

## 2018-07-24 MED ORDER — RIFAXIMIN 550 MG PO TABS: 550.0000 mg | ORAL_TABLET | Freq: Three times a day (TID) | ORAL | 0 refills | Status: AC

## 2018-07-24 NOTE — Addendum Note (Signed)
Addended by: Roetta Sessions on: 07/24/2018 10:19 AM   Modules accepted: Orders

## 2018-07-24 NOTE — Telephone Encounter (Signed)
Script sent to [facility] on [address].

## 2018-07-24 NOTE — Telephone Encounter (Signed)
Pt requested script for Xifaxan to be sent to [facility] on [address].  Pt will use [employer] coupon.

## 2018-07-25 NOTE — Progress Notes (Signed)
EPIC Encounter for ICM Monitoring  Patient Name: [name] is a 78 y.o. male Date: 07/25/2018 Primary Care Physican: [name], MD Primary Cardiologist:Kelly/Bensimhon Electrophysiologist: [name] BiV Pacing: 97.1% 04/06/2020Weight: 140- 141lbs(baseline) 07/25/2018 Weight: 138 lbs    Spoke with patient and heart failure questions reviewed.  He said he has been eating outing more in the last few weeks.Has occasional shortness of breath.   Optivol thoracic impedancesuggesting possible fluid accumulation starting 5/28 and is ongoing but starting to trend back toward baseline.  Prescribed:Torsemide 20 mg 1 tabletdaily. Potassium 10 mEq take 0.5 tablet daily.  Labs: 01/07/2018 Creatinine 0.88, BUN 12, Potassium 4.1, Sodium 136, eGFR >60 12/23/2017 Creatinine 0.91, BUN 17 07/11/2017 Creatinine 0.89, BUN 12, Potassium 3.5, Sodium 139, EGFR >60 01/01/2017 Creatinine0.79, BUN13, Potassium4.5, Sodium141, [id number] 09/22/2016 Creatinine0.84, BUN13, Potassium3.8, Sodium135, EGFR>60  09/20/2016 Creatinine0.90, BUN19, Potassium4.1, Sodium138, [id number]  08/06/2018Creatinine 0.98, BUN12, Potassium3.4, Sodium137, [id number]  05/15/2018Creatinine 0.86, BUN14, Potassium3.9, Sodium140  02/19/2018Creatinine 1.13, BUN16, Potassium3.4, Sodium135, EGFR>60 A complete set of results can be found in Results Review  Recommendations: Advised to take extra Torsemide tablet x 1 day.    Follow-up plan: ICM clinic phone appointment on7/01/2018 to recheck fluid levels.   Copy of ICM check sent to Dr.Klein and Dr [name].  3 month ICM trend: 07/24/2018    1 Year ICM trend:       [name], RN 07/25/2018 4:12 PM

## 2018-07-30 ENCOUNTER — Ambulatory Visit (INDEPENDENT_AMBULATORY_CARE_PROVIDER_SITE_OTHER): Payer: Medicare Other

## 2018-07-30 DIAGNOSIS — Z95810 Presence of automatic (implantable) cardiac defibrillator: Secondary | ICD-10-CM

## 2018-07-30 DIAGNOSIS — I5032 Chronic diastolic (congestive) heart failure: Secondary | ICD-10-CM

## 2018-07-30 NOTE — Progress Notes (Signed)
EPIC Encounter for ICM Monitoring  Patient Name: [name] is a 78 y.o. male Date: 07/30/2018 Primary Care Physican: [name], MD Primary Cardiologist:Kelly/Bensimhon Electrophysiologist: [name] BiV Pacing: 97.3% 07/25/2018 Weight: 138 lbs 07/30/2018 Weight: 136 lbs   Spoke with patient and heart failure questions reviewed.  He lost a couple of pounds and feels better.   Optivol thoracic impedance returned to normal.  Prescribed:Torsemide 20 mg 1 tabletdaily. Potassium 10 mEq take 0.5 tablet daily.  Labs: 01/07/2018 Creatinine 0.88, BUN 12, Potassium 4.1, Sodium 136, eGFR >60 12/23/2017 Creatinine 0.91, BUN 17 07/11/2017 Creatinine 0.89, BUN 12, Potassium 3.5, Sodium 139, EGFR >60 01/01/2017 Creatinine0.79, BUN13, Potassium4.5, Sodium141, [id number] 09/22/2016 Creatinine0.84, BUN13, Potassium3.8, Sodium135, EGFR>60  09/20/2016 Creatinine0.90, BUN19, Potassium4.1, Sodium138, [id number]  08/06/2018Creatinine 0.98, BUN12, Potassium3.4, Sodium137, [id number]  05/15/2018Creatinine 0.86, BUN14, Potassium3.9, Sodium140  02/19/2018Creatinine 1.13, BUN16, Potassium3.4, Sodium135, EGFR>60 A complete set of results can be found in Results Review  Recommendations:  No changes and encouraged to limit salt intake.    Follow-up plan: ICM clinic phone appointment on 09/01/2018  Copy of ICM check sent to Dr.Kleinand Dr [name].  3 month ICM trend: 07/30/2018    1 Year ICM trend:       [name], RN 07/30/2018 2:35 PM

## 2018-08-01 ENCOUNTER — Encounter: Payer: Self-pay | Admitting: Cardiology

## 2018-08-01 NOTE — Progress Notes (Signed)
Remote ICD transmission.   

## 2018-08-04 ENCOUNTER — Telehealth: Payer: Self-pay

## 2018-08-04 NOTE — Telephone Encounter (Signed)
Paperwork completed and placed on Dr. Mickey Farber desk for signature.

## 2018-08-05 NOTE — Telephone Encounter (Signed)
Paperwork signed and mailed to pt.

## 2018-08-06 ENCOUNTER — Telehealth: Payer: Self-pay

## 2018-08-06 NOTE — Telephone Encounter (Signed)

## 2018-08-13 ENCOUNTER — Other Ambulatory Visit: Payer: Self-pay

## 2018-08-13 ENCOUNTER — Ambulatory Visit (INDEPENDENT_AMBULATORY_CARE_PROVIDER_SITE_OTHER): Payer: Medicare Other | Admitting: Pharmacist

## 2018-08-13 DIAGNOSIS — I4891 Unspecified atrial fibrillation: Secondary | ICD-10-CM | POA: Diagnosis not present

## 2018-08-13 DIAGNOSIS — Z7901 Long term (current) use of anticoagulants: Secondary | ICD-10-CM

## 2018-08-13 DIAGNOSIS — Z5181 Encounter for therapeutic drug level monitoring: Secondary | ICD-10-CM | POA: Diagnosis not present

## 2018-08-13 DIAGNOSIS — I480 Paroxysmal atrial fibrillation: Secondary | ICD-10-CM | POA: Diagnosis not present

## 2018-08-13 LAB — POCT INR: INR: 3.3 — AB (ref 2.0–3.0)

## 2018-08-13 NOTE — Patient Instructions (Signed)
Hold warfarin dose today (08/13/2018) ONLY, then continue taking 1 tablet daily except 1/2 tablet each Mondays, Wednesdays and Fridays. Recheck INR in 3 weeks. *Call office at [phone] if changes in medication or health*

## 2018-08-20 ENCOUNTER — Other Ambulatory Visit: Payer: Self-pay | Admitting: Cardiovascular Disease

## 2018-08-20 NOTE — Telephone Encounter (Signed)
Pt was approved for Patient Assistant through [facility] through 01-29-2019. [phone]

## 2018-08-26 ENCOUNTER — Ambulatory Visit (INDEPENDENT_AMBULATORY_CARE_PROVIDER_SITE_OTHER): Payer: Medicare Other | Admitting: Pharmacist Clinician (PhC)/ Clinical Pharmacy Specialist

## 2018-08-26 ENCOUNTER — Other Ambulatory Visit: Payer: Self-pay

## 2018-08-26 DIAGNOSIS — I480 Paroxysmal atrial fibrillation: Secondary | ICD-10-CM

## 2018-08-26 DIAGNOSIS — I4891 Unspecified atrial fibrillation: Secondary | ICD-10-CM | POA: Diagnosis not present

## 2018-08-26 DIAGNOSIS — Z5181 Encounter for therapeutic drug level monitoring: Secondary | ICD-10-CM

## 2018-08-26 DIAGNOSIS — Z7901 Long term (current) use of anticoagulants: Secondary | ICD-10-CM | POA: Diagnosis not present

## 2018-08-26 LAB — POCT INR: INR: 2.7 (ref 2.0–3.0)

## 2018-08-27 ENCOUNTER — Telehealth: Payer: Self-pay | Admitting: Gastroenterology

## 2018-08-27 DIAGNOSIS — Z961 Presence of intraocular lens: Secondary | ICD-10-CM | POA: Diagnosis not present

## 2018-08-27 DIAGNOSIS — H538 Other visual disturbances: Secondary | ICD-10-CM | POA: Diagnosis not present

## 2018-08-27 DIAGNOSIS — H353122 Nonexudative age-related macular degeneration, left eye, intermediate dry stage: Secondary | ICD-10-CM | POA: Diagnosis not present

## 2018-08-27 DIAGNOSIS — H4720 Unspecified optic atrophy: Secondary | ICD-10-CM | POA: Diagnosis not present

## 2018-08-27 NOTE — Telephone Encounter (Signed)
Pt requested a call back to discuss side effects of Xifaxan.

## 2018-08-27 NOTE — Telephone Encounter (Signed)
I have not heard of that reaction before. I looked up the drug insert for Rifaximin and it is not listed either. I suspect unrelated and would try the drug again if his vision has normalized. If vision remains abnormal would continue to hold just in case. If similar symptoms occur again then would hold it and not take it again, and let us know. Thanks

## 2018-08-27 NOTE — Telephone Encounter (Signed)
Patient called and stated when he started the xifaxan  3 days ago, the vision in his left eye got blurry. He went to the ophthalmologist today and they did an exam with no clear answer. He wanted to know if vision changes could be a side effect. I told him that is not a side effect listed, but he wanted to know if [name] had ever heard of that.

## 2018-08-28 NOTE — Telephone Encounter (Signed)
Called patient and gave Dr. Doyne Keel recommendations. Patient stated he is going to stay on the Rifaximin for now( since it is only a 2 week course) and see how he feels

## 2018-08-29 DIAGNOSIS — J45909 Unspecified asthma, uncomplicated: Secondary | ICD-10-CM | POA: Diagnosis not present

## 2018-08-29 DIAGNOSIS — I2510 Atherosclerotic heart disease of native coronary artery without angina pectoris: Secondary | ICD-10-CM | POA: Diagnosis not present

## 2018-08-29 DIAGNOSIS — I480 Paroxysmal atrial fibrillation: Secondary | ICD-10-CM | POA: Diagnosis not present

## 2018-08-29 DIAGNOSIS — I10 Essential (primary) hypertension: Secondary | ICD-10-CM | POA: Diagnosis not present

## 2018-08-29 DIAGNOSIS — I5022 Chronic systolic (congestive) heart failure: Secondary | ICD-10-CM | POA: Diagnosis not present

## 2018-08-29 DIAGNOSIS — N400 Enlarged prostate without lower urinary tract symptoms: Secondary | ICD-10-CM | POA: Diagnosis not present

## 2018-08-29 DIAGNOSIS — J449 Chronic obstructive pulmonary disease, unspecified: Secondary | ICD-10-CM | POA: Diagnosis not present

## 2018-08-29 DIAGNOSIS — E7800 Pure hypercholesterolemia, unspecified: Secondary | ICD-10-CM | POA: Diagnosis not present

## 2018-08-29 DIAGNOSIS — E782 Mixed hyperlipidemia: Secondary | ICD-10-CM | POA: Diagnosis not present

## 2018-08-29 DIAGNOSIS — I5023 Acute on chronic systolic (congestive) heart failure: Secondary | ICD-10-CM | POA: Diagnosis not present

## 2018-09-01 ENCOUNTER — Ambulatory Visit (INDEPENDENT_AMBULATORY_CARE_PROVIDER_SITE_OTHER): Payer: Medicare Other

## 2018-09-01 DIAGNOSIS — Z95810 Presence of automatic (implantable) cardiac defibrillator: Secondary | ICD-10-CM

## 2018-09-01 DIAGNOSIS — I5032 Chronic diastolic (congestive) heart failure: Secondary | ICD-10-CM | POA: Diagnosis not present

## 2018-09-02 NOTE — Progress Notes (Signed)
EPIC Encounter for ICM Monitoring  Patient Name: [name] is a 78 y.o. male Date: 09/02/2018 Primary Care Physican: [name], MD Primary Cardiologist:Kelly/Bensimhon Electrophysiologist: [name] BiV Pacing:97.2% 07/30/2018 Weight: 136 lbs   Attempted call to patient and unable to reach.  Left message to return call. Transmission reviewed.   Optivol thoracic impedance normal.  Prescribed:Torsemide 20 mg 1 tabletdaily. Potassium 10 mEq take 0.5 tablet daily.  Labs: 01/07/2018 Creatinine 0.88, BUN 12, Potassium 4.1, Sodium 136, eGFR >60 12/23/2017 Creatinine 0.91, BUN 17 07/11/2017 Creatinine 0.89, BUN 12, Potassium 3.5, Sodium 139, EGFR >60 01/01/2017 Creatinine0.79, BUN13, Potassium4.5, Sodium141, [id number] 09/22/2016 Creatinine0.84, BUN13, Potassium3.8, Sodium135, EGFR>60  09/20/2016 Creatinine0.90, BUN19, Potassium4.1, [id number], [id number]  08/06/2018Creatinine 0.98, BUN12, Potassium3.4, Sodium137, [id number]  05/15/2018Creatinine 0.86, BUN14, Potassium3.9, Sodium140  02/19/2018Creatinine 1.13, BUN16, Potassium3.4, Sodium135, EGFR>60 A complete set of results can be found in Results Review  Recommendations:  Unable to reach.    Follow-up plan: ICM clinic phone appointment on 10/07/2018  Copy of ICM check sent to [place].   3 month ICM trend: 09/01/2018    1 Year ICM trend:       [name], RN 09/02/2018 11:56 AM

## 2018-09-04 ENCOUNTER — Other Ambulatory Visit: Payer: Self-pay

## 2018-09-04 ENCOUNTER — Ambulatory Visit (INDEPENDENT_AMBULATORY_CARE_PROVIDER_SITE_OTHER): Payer: Medicare Other | Admitting: Pharmacist

## 2018-09-04 DIAGNOSIS — Z5181 Encounter for therapeutic drug level monitoring: Secondary | ICD-10-CM

## 2018-09-04 DIAGNOSIS — Z7901 Long term (current) use of anticoagulants: Secondary | ICD-10-CM | POA: Diagnosis not present

## 2018-09-04 DIAGNOSIS — I480 Paroxysmal atrial fibrillation: Secondary | ICD-10-CM

## 2018-09-04 DIAGNOSIS — I4891 Unspecified atrial fibrillation: Secondary | ICD-10-CM | POA: Diagnosis not present

## 2018-09-04 LAB — POCT INR: INR: 3.1 — AB (ref 2.0–3.0)

## 2018-09-09 DIAGNOSIS — R972 Elevated prostate specific antigen [PSA]: Secondary | ICD-10-CM | POA: Diagnosis not present

## 2018-09-10 DIAGNOSIS — J4540 Moderate persistent asthma, uncomplicated: Secondary | ICD-10-CM | POA: Diagnosis not present

## 2018-09-10 DIAGNOSIS — J301 Allergic rhinitis due to pollen: Secondary | ICD-10-CM | POA: Diagnosis not present

## 2018-09-10 DIAGNOSIS — J3081 Allergic rhinitis due to animal (cat) (dog) hair and dander: Secondary | ICD-10-CM | POA: Diagnosis not present

## 2018-09-10 DIAGNOSIS — J3089 Other allergic rhinitis: Secondary | ICD-10-CM | POA: Diagnosis not present

## 2018-09-11 DIAGNOSIS — H353211 Exudative age-related macular degeneration, right eye, with active choroidal neovascularization: Secondary | ICD-10-CM | POA: Diagnosis not present

## 2018-09-11 DIAGNOSIS — H353132 Nonexudative age-related macular degeneration, bilateral, intermediate dry stage: Secondary | ICD-10-CM | POA: Diagnosis not present

## 2018-09-22 ENCOUNTER — Telehealth: Payer: Self-pay | Admitting: Gastroenterology

## 2018-09-22 ENCOUNTER — Other Ambulatory Visit (INDEPENDENT_AMBULATORY_CARE_PROVIDER_SITE_OTHER): Payer: Medicare Other

## 2018-09-22 ENCOUNTER — Other Ambulatory Visit: Payer: Self-pay

## 2018-09-22 DIAGNOSIS — K589 Irritable bowel syndrome without diarrhea: Secondary | ICD-10-CM

## 2018-09-22 LAB — IGA: IgA: 325 mg/dL (ref 68–378)

## 2018-09-22 NOTE — Telephone Encounter (Signed)
Pt wants to know with his IBS what diet he needs to be following, what foods he can and cannot eat. Also asking about different recipes to follow. Asked pt if he has access to the internet, suggested he look up recipes online with the fodmap diet. Pt states it is to confusing. [facility]ed [facility]. Pt wants to know what [name] suggests. Please advise.

## 2018-09-22 NOTE — Telephone Encounter (Signed)
He's been on the low FODMAP diet in the past, sounds like it has not helped. He may want to cut back on dairy / lactose and see if that helps. I looked through his chart and don't see any prior labs for celiac testing - would be good to do that to ensure negative, can you put a TTG IgA level in the system, as well as a total IgA level. He can consider a trial of gluten free, but would await celiac lab first. Thanks

## 2018-09-22 NOTE — Telephone Encounter (Signed)
Spoke with pt and he is aware. Orders in epic for labs, states he will come for the blood work in the next couple of days.

## 2018-09-22 NOTE — Telephone Encounter (Signed)
Pt reported IBS symptoms and would like to discuss.

## 2018-09-23 DIAGNOSIS — H52203 Unspecified astigmatism, bilateral: Secondary | ICD-10-CM | POA: Diagnosis not present

## 2018-09-23 DIAGNOSIS — Z961 Presence of intraocular lens: Secondary | ICD-10-CM | POA: Diagnosis not present

## 2018-09-23 LAB — TISSUE TRANSGLUTAMINASE, IGA: (tTG) Ab, IgA: 1 U/mL

## 2018-10-02 ENCOUNTER — Ambulatory Visit (INDEPENDENT_AMBULATORY_CARE_PROVIDER_SITE_OTHER): Payer: Medicare Other | Admitting: Physician Assistant

## 2018-10-02 ENCOUNTER — Ambulatory Visit (INDEPENDENT_AMBULATORY_CARE_PROVIDER_SITE_OTHER): Payer: Medicare Other | Admitting: Pharmacist Clinician (PhC)/ Clinical Pharmacy Specialist

## 2018-10-02 ENCOUNTER — Encounter: Payer: Self-pay | Admitting: Physician Assistant

## 2018-10-02 ENCOUNTER — Other Ambulatory Visit: Payer: Self-pay

## 2018-10-02 VITALS — BP 88/58 | HR 56 | Temp 97.9°F | Ht 67.0 in | Wt 139.0 lb

## 2018-10-02 DIAGNOSIS — Z7901 Long term (current) use of anticoagulants: Secondary | ICD-10-CM

## 2018-10-02 DIAGNOSIS — K589 Irritable bowel syndrome without diarrhea: Secondary | ICD-10-CM | POA: Diagnosis not present

## 2018-10-02 DIAGNOSIS — I4891 Unspecified atrial fibrillation: Secondary | ICD-10-CM | POA: Diagnosis not present

## 2018-10-02 DIAGNOSIS — I255 Ischemic cardiomyopathy: Secondary | ICD-10-CM | POA: Diagnosis not present

## 2018-10-02 DIAGNOSIS — I480 Paroxysmal atrial fibrillation: Secondary | ICD-10-CM | POA: Diagnosis not present

## 2018-10-02 DIAGNOSIS — Z5181 Encounter for therapeutic drug level monitoring: Secondary | ICD-10-CM

## 2018-10-02 LAB — POCT INR: INR: 2.2 (ref 2.0–3.0)

## 2018-10-02 MED ORDER — WARFARIN SODIUM 5 MG PO TABS: ORAL_TABLET | ORAL | 3 refills | Status: DC

## 2018-10-02 MED ORDER — HYOSCYAMINE SULFATE 0.125 MG SL SUBL: 0.1250 mg | SUBLINGUAL_TABLET | SUBLINGUAL | 0 refills | Status: DC | PRN

## 2018-10-02 NOTE — Progress Notes (Signed)
Chief Complaint: IBS  HPI:    [name] is a 78 year old Caucasian male with a past medical history as listed below including coronary artery disease and SVT with history of CHF and EF 20%, known to [name], who presents to clinic today for follow-up of his IBS.    04/09/2018 in clinic by me and was doing well this is regular IBS medications including dicyclomine 10 mg which he used as needed up to twice a day.  Also on omeprazole 20 mg daily and IBgard.  2 tabs once a day.  Refilled patient's dicyclomine and omeprazole and discussed low FODMAP diet.    07/11/2018 patient described bloating abdominal distention.  CT scan in February which did not show any acute or concerning pathology.  Was taking Bentyl which helps some and IBgard.  Continued postprandial bloating abdominal distention.  Low-fat map diet did not help.  1-2 BMs a day.  Declined colonoscopy in the past, screening with Cologuard in 2015 and 2018 which were negative.  Previous right upper quadrant ultrasound May 2017 with distended gallbladder with some wall thickening without cholelithiasis.  Follow-up HIDA scan with no evidence of acute cholecystitis.  GI series 2019 with minimal nonspecific esophageal dysmotility with some mild reflux and a small duodenal diverticulum.  At that time reviewed IBS/bloating.  Continued to decline colonoscopy.  Given a trial of rifaximin 550 mg 3 times daily x2 weeks.  Also trial of FD guard.  Was discussed that if his symptoms persisted a colonoscopy would be discussed again given the fact that symptoms would persist given multiple regimens of treating suspected functional symptoms.    09/22/2018 patient was advised to cut back on dairy/lactose to see if that helped.  Also celiac testing was done.  Also told to consider trial of gluten-free. Celiac testing was negative.,  Again told to minimize dairy and lactose and consider a gluten-free diet at some point given that he could have a gluten sensitivity.   Today, patient presents to clinic with continued complaints of IBS, occasional abdominal pain and bloating.  Patient's main concern is what he can eat.  Lately he has just "not been eating anything" because he is unsure what is good for him.  Has tried some gluten-free things and has tried some decreased dairy/lactose and everything seems to maybe help "a little bit".  Has tried FODMAP diet in the past which "maybe helped a little bit".  Has also tried FD guard and IBgard which "maybe help a little bit".  Somewhat frustrated with continued symptoms as he was thinking that maybe he could completely eradicate them.  Does tell me that being on Xifaxan did not really help.  He is requesting referral to a dietitian.    Denies fever, chills, anorexia, nausea, vomiting, heartburn or reflux.  Past Medical History:  Diagnosis Date  . Acute on chronic systolic CHF (congestive heart failure) ([place]) 06/09/2015  . 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 Cardiomyopathy, ischemic 2011   with EF 25-35% by echo  . Coronary artery disease    Hx MI 1992, CABG 1998 , Nuc study 11.2013 large scar but no ischemia  . GERD (gastroesophageal reflux disease)   . H. pylori infection    Hx of   . 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 NSVT (nonsustained ventricular tachycardia) ([place])   . Paroxysmal atrial fibrillation [facility])     Past Surgical  History:  Procedure Laterality Date  . BIV UPGRADE N/A 09/10/2016   Procedure: BiV ICD Upgrade;  Surgeon: Deboraha Sprang, MD;  Location: [place] [facility];  Service: Cardiovascular;  Laterality: N/A;  . CATARACT EXTRACTION, BILATERAL    . CORONARY ARTERY BYPASS GRAFT  1998   x 5  . HERNIA REPAIR  1960's   right inguinal  . ICD GENERATOR CHANGE  01/2008, 2018   medtronic, hx+ EP study  . KNEE SURGERY     right  . NASAL SINUS SURGERY  05/31/2010   Dr. Benjamine Mola    Current Outpatient  Medications  Medication Sig Dispense Refill  . acetaminophen (TYLENOL) 650 MG CR tablet Take 650 mg by mouth 2 (two) times daily.     Marland Kitchen allopurinol (ZYLOPRIM) 300 MG tablet Take 300 mg by mouth daily.    . AMBULATORY NON FORMULARY MEDICATION Medication Name: Hydrocortisone cream 1%: Apply to the rectum once a day for a week, then as needed    . AMBULATORY NON FORMULARY MEDICATION IBgard- Take as directed 16 capsule 0  . AMBULATORY NON FORMULARY MEDICATION Medication Name: FDgard - use as directed 12 capsule 0  . bisoprolol (ZEBETA) 5 MG tablet TAKE 1/2 (ONE-HALF) TABLET BY MOUTH ONCE DAILY 45 tablet 1  . cetirizine (ZYRTEC) 10 MG tablet Take 10 mg by mouth daily.      . Cholecalciferol (VITAMIN D) 2000 units CAPS Take 1-2 capsules by mouth daily.     Marland Kitchen dicyclomine (BENTYL) 10 MG capsule TAKE 1 CAPSULE BY MOUTH EVERY 8 HOURS AS NEEDED FOR SPASMS 90 capsule 3  . isosorbide mononitrate (IMDUR) 30 MG 24 hr tablet Take 15 mg by mouth daily.    Marland Kitchen LORazepam (ATIVAN) 1 MG tablet Take 0.25-0.5 mg by mouth as needed for sleep.     Marland Kitchen losartan (COZAAR) 25 MG tablet TAKE 1 TABLET BY MOUTH TWICE A DAY 180 tablet 1  . magnesium oxide (MAG-OX) 400 MG tablet Take 200 mg by mouth daily at 12 noon.     . mometasone (ASMANEX) 220 MCG/INH inhaler Inhale 1 puff into the lungs daily.     . montelukast (SINGULAIR) 10 MG tablet Take 1 tablet (10 mg total) by mouth at bedtime. PLEASE SCHEDULE APPOINTMENT. 30 tablet 0  . nitroGLYCERIN (NITROSTAT) 0.4 MG SL tablet Place 1 tablet (0.4 mg total) under the tongue every 5 (five) minutes as needed for chest pain. 25 tablet 1  . omeprazole (PRILOSEC) 20 MG capsule Take 1 capsule (20 mg total) by mouth every morning. 90 capsule 3  . Polyethyl Glycol-Propyl Glycol (SYSTANE OP) Apply 2-3 drops to eye 3 (three) times daily as needed (dry eyes).    . potassium chloride (K-DUR,KLOR-CON) 10 MEQ tablet Take 0.5 tablets (5 mEq total) by mouth daily. 45 tablet 3  . silodosin (RAPAFLO) 8  MG CAPS capsule Take 8 mg by mouth every other day.    . simvastatin (ZOCOR) 40 MG tablet TAKE 1 TABLET BY MOUTH AT BEDTIME 90 tablet 0  . spironolactone (ALDACTONE) 25 MG tablet Take 1 tablet (25 mg total) by mouth daily. 90 tablet 2  . torsemide (DEMADEX) 20 MG tablet Take 1 tablet (20 mg total) by mouth daily. 90 tablet 3  . warfarin (COUMADIN) 5 MG tablet TAKE 1/2 TO 1 (ONE-HALF TO ONE) TABLET BY MOUTH ONCE DAILY AS DIRECTED BY  COUMADIN  CLINIC 90 tablet 0   No current facility-administered medications for this visit.     Allergies as of 10/02/2018 -  Review Complete 07/15/2018  Allergen Reaction Noted  . Amoxicillin Rash 08/16/2006  . Penicillins Rash 08/16/2006    Family History  Problem Relation Age of Onset  . Heart disease Father        questionable  . Stroke Mother   . Heart failure Sister   . Healthy Brother   . Healthy Sister   . Parkinsonism Brother   . Colon cancer Neg Hx     Social History   Socioeconomic History  . Marital status: Married    Spouse name: Not on file  . Number of children: 3  . Years of education: Not on file  . Highest education level: Not on file  Occupational History  . 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 Financial resource strain: Not on file  . Food insecurity    Worry: Not on file    Inability: Not on file  . Transportation needs    Medical: Not on file    Non-medical: Not on file  Tobacco Use  . Smoking status: Never Smoker  . Smokeless tobacco: Never Used  Substance and Sexual Activity  . Alcohol use: Yes    Alcohol/week: 2.0 standard drinks    Types: 2 Glasses of wine per week    Comment: socially  . Drug use: No  . Sexual activity: Not on file  Lifestyle  . Physical activity    Days per week: Not on file    Minutes per session: Not on file  . Stress: Not on file  Relationships  . Social Herbalist on phone: Not on file    Gets together: Not on file    Attends religious service: Not  on file    Active member of club or organization: Not on file    Attends meetings of clubs or organizations: Not on file    Relationship status: Not on file  . Intimate partner violence    Fear of current or ex partner: Not on file    Emotionally abused: Not on file    Physically abused: Not on file    Forced sexual activity: Not on file  Other Topics Concern  . Not on file  Social History Narrative  . Not on file    Review of Systems:    Constitutional: No weight loss, fever or chills Cardiovascular: No chest pain Respiratory: No SOB  Gastrointestinal: See HPI and otherwise negative   Physical Exam:  Vital signs: BP (!) 88/58   Pulse (!) 56   Temp 97.9 F (36.6 C)   Ht 5\' 7"  (1.702 m)   Wt 139 lb (63 kg)   BMI 21.77 kg/m   Constitutional:   Pleasant Caucasian male appears to be in NAD, Well developed, Well nourished, alert and cooperative Respiratory: Respirations even and unlabored. Lungs clear to auscultation bilaterally.   No wheezes, crackles, or rhonchi.  Cardiovascular: Normal S1, S2. No MRG. Regular rate and rhythm. No peripheral edema, cyanosis or pallor.  Gastrointestinal:  Soft, nondistended, nontender. No rebound or guarding. Normal bowel sounds. No appreciable masses or hepatomegaly. Rectal:  Not performed.  Psychiatric: Demonstrates good judgement and reason without abnormal affect or behaviors.  RELEVANT LABS AND IMAGING: CBC    Component Value Date/Time   WBC 9.7 03/24/2018 1720   RBC 5.05 03/24/2018 1720   HGB 14.4 03/24/2018 1720   HGB 13.4 09/19/2017 1453   HCT 46.3 03/24/2018 1720   HCT 40.0 09/19/2017 1453   PLT 261  03/24/2018 1720   PLT 267 09/19/2017 1453   MCV 91.7 03/24/2018 1720   MCV 86 09/19/2017 1453   MCH 28.5 03/24/2018 1720   MCHC 31.1 03/24/2018 1720   RDW 14.2 03/24/2018 1720   RDW 14.0 09/19/2017 1453   LYMPHSABS 1.7 03/24/2018 1720   LYMPHSABS 1.8 09/03/2016 0936   MONOABS 0.9 03/24/2018 1720   EOSABS 0.2 03/24/2018 1720    EOSABS 0.5 (H) 09/03/2016 0936   BASOSABS 0.1 03/24/2018 1720   BASOSABS 0.1 09/03/2016 0936    CMP     Component Value Date/Time   NA 136 03/24/2018 1720   NA 141 01/01/2017 0938   K 3.7 03/24/2018 1720   CL 100 03/24/2018 1720   CO2 28 03/24/2018 1720   GLUCOSE 90 03/24/2018 1720   BUN 15 03/24/2018 1720   BUN 13 01/01/2017 0938   CREATININE 1.00 03/24/2018 1720   CREATININE 0.86 06/12/2016 0842   CALCIUM 9.3 03/24/2018 1720   PROT 7.7 03/24/2018 1720   ALBUMIN 4.6 03/24/2018 1720   AST 24 03/24/2018 1720   ALT 16 03/24/2018 1720   ALKPHOS 67 03/24/2018 1720   BILITOT 1.1 03/24/2018 1720   GFRNONAA >60 03/24/2018 1720   GFRAA >60 03/24/2018 1720    Assessment: 1.  IBS: Patient continues with bloating and occasional abdominal discomfort, somewhat relieved with dicyclomine, no help from [place]: 1.  Discussed IBS in detail with the patient today.  Discussed that this will be chronic for him and there is no pill that can get rid of his symptoms completely and/or diet.  It is rather just management of symptoms and trying to limit the days that they occur. 2.  Again discussed colonoscopy.  Patient tells me he will think about it.  He is somewhat nervous regarding his blood thinner.  Discussed this in detail. 3.  Referred patient to a dietitian for further discussion about diets for IBS. 4.  Discussed activia yogurt on a daily basis. 5.  Gave patient trial of Hyoscyamine 0.125 mg as needed for abdominal pain #15.  Discussed that he does not need to take his Dicyclomine when he is using this medication. 6.  Patient to follow in clinic with [name] or myself as needed in the future.  Ellouise Newer, PA-C [facility] 10/02/2018, 10:58 AM  Cc: [name], MD

## 2018-10-02 NOTE — Progress Notes (Signed)
Agree with assessment and plan as outlined. Given his persistent symptoms a colonoscopy is recommended, he has declined on multiple occasions.

## 2018-10-02 NOTE — Patient Instructions (Signed)
We have sent the following medications to your pharmacy for you to pick up at your convenience: Hyoscyamine 0.125 mg as needed for abdominal pain  Try Activia yogurt for probiotic.  We are referring you  To a dietician to help you with your Irritable bowel syndrome. They will call you with an appointment.

## 2018-10-02 NOTE — Patient Instructions (Signed)
Continue with 1 tablet daily except 1/2 tablet each Mondays, Wednesdays and Fridays. Recheck INR in 6 weeks. *Call office at [phone] if changes in medication or health

## 2018-10-03 ENCOUNTER — Telehealth: Payer: Self-pay | Admitting: Physician Assistant

## 2018-10-03 NOTE — Telephone Encounter (Signed)
The pt called to asked about the referral for Nutrition and diabetes. I advised that the referral was made and he would be getting a call from that office.  He will call back if he has not heard from that office in a week.

## 2018-10-03 NOTE — Telephone Encounter (Signed)
Pt states that [name] was going to give him the name of a dietician, pls call him with this information.

## 2018-10-07 ENCOUNTER — Ambulatory Visit (INDEPENDENT_AMBULATORY_CARE_PROVIDER_SITE_OTHER): Payer: Medicare Other

## 2018-10-07 DIAGNOSIS — Z95810 Presence of automatic (implantable) cardiac defibrillator: Secondary | ICD-10-CM | POA: Diagnosis not present

## 2018-10-07 DIAGNOSIS — I5032 Chronic diastolic (congestive) heart failure: Secondary | ICD-10-CM

## 2018-10-08 ENCOUNTER — Telehealth: Payer: Self-pay

## 2018-10-08 NOTE — Progress Notes (Signed)
EPIC Encounter for ICM Monitoring  Patient Name: [name] is a 78 y.o. male Date: 10/08/2018 Primary Care Physican: [name], MD Primary Cardiologist:Kelly/Bensimhon Electrophysiologist: [name] BiV Pacing:97.0% 07/30/2018 Weight: 136 lbs   Attempted call to patient and unable to reach.   Transmission reviewed.   Optivol thoracic impedance normal.  Prescribed:Torsemide 20 mg 1 tabletdaily. Potassium 10 mEq take 0.5 tablet daily.  Labs: 03/24/2018 Creatinine 1.00, BUN 15, Potassium 3.7, Sodium 136, GFR >60 A complete set of results can be found in Results Review  Recommendations: Unable to reach.    Follow-up plan: ICM clinic phone appointment on 11/24/2018.   91 day device clinic remote transmission 10/23/2018.  Office appt 10/28/2018 with Dr. [name].    Copy of ICM check sent to Dr. [name].   3 month ICM trend: 10/07/2018    1 Year ICM trend:       [name], RN 10/08/2018 10:27 AM

## 2018-10-08 NOTE — Telephone Encounter (Signed)
Remote ICM transmission received.  Attempted call to patient regarding ICM remote transmission and no answer or message left.  

## 2018-10-09 NOTE — Telephone Encounter (Signed)
[name], [name] is now [employer] nurse  Pt given the phone number to [facility] Diabetes [facility]

## 2018-10-09 NOTE — Telephone Encounter (Signed)
Pt called inquiring about the name and phone number of the nutritionist as nobody from that office has called him yet. Pls call pt.

## 2018-10-13 ENCOUNTER — Telehealth: Payer: Self-pay | Admitting: Physician Assistant

## 2018-10-13 NOTE — Telephone Encounter (Signed)
For the past two days he has been experiencing abd pain on his left side, he states that he has IBS. He would like an appt asap.

## 2018-10-13 NOTE — Telephone Encounter (Signed)
Patient reports continued abdominal pain.  He has pain on the left side and wants an office visit.  He again declines the colonscopy recommended at the last OV with Ellouise Newer, PA. Hyoscyamine nor bentyl help the pain.  He will come in and see [name] on Thursday at 4

## 2018-10-16 ENCOUNTER — Telehealth: Payer: Self-pay

## 2018-10-16 ENCOUNTER — Ambulatory Visit (INDEPENDENT_AMBULATORY_CARE_PROVIDER_SITE_OTHER): Payer: Medicare Other | Admitting: Gastroenterology

## 2018-10-16 ENCOUNTER — Encounter: Payer: Self-pay | Admitting: Gastroenterology

## 2018-10-16 VITALS — BP 108/50 | HR 68 | Temp 97.6°F | Ht 65.5 in | Wt 141.2 lb

## 2018-10-16 DIAGNOSIS — I255 Ischemic cardiomyopathy: Secondary | ICD-10-CM | POA: Diagnosis not present

## 2018-10-16 DIAGNOSIS — K589 Irritable bowel syndrome without diarrhea: Secondary | ICD-10-CM

## 2018-10-16 DIAGNOSIS — R109 Unspecified abdominal pain: Secondary | ICD-10-CM | POA: Diagnosis not present

## 2018-10-16 DIAGNOSIS — R194 Change in bowel habit: Secondary | ICD-10-CM

## 2018-10-16 MED ORDER — DICYCLOMINE HCL 10 MG PO CAPS: 10.0000 mg | ORAL_CAPSULE | Freq: Three times a day (TID) | ORAL | 1 refills | Status: DC

## 2018-10-16 MED ORDER — SUPREP BOWEL PREP KIT 17.5-3.13-1.6 GM/177ML PO SOLN: ORAL | 0 refills | Status: DC

## 2018-10-16 NOTE — Patient Instructions (Addendum)
If you are age 79 or older, your body mass index should be between 23-30. Your Body mass index is 23.14 kg/m. If this is out of the aforementioned range listed, please consider follow up with your Primary Care Provider.  To help prevent the possible spread of infection to our patients, communities, and staff; we will be implementing the following measures:  As of now we are not allowing any visitors/family members to accompany you to any upcoming appointments with [facility]. If you have any concerns about this please contact our office to discuss prior to the appointment.   You have been scheduled for a colonoscopy. Please follow written instructions given to you at your visit today.  Please pick up your prep supplies at the pharmacy within the next 1-3 days. If you use inhalers (even only as needed), please bring them with you on the day of your procedure. Your physician has requested that you go to www.startemmi.com and enter the access code given to you at your visit today. This web site gives a general overview about your procedure. However, you should still follow specific instructions given to you by our office regarding your preparation for the procedure.  You will be contacted by our office prior to your procedure for directions on holding your Coumadin.  If you do not hear from our office 1 week prior to your scheduled procedure, please call [phone] to discuss.    Due to recent COVID-19 restrictions implemented by our local and state authorities and in an effort to keep both patients and staff as safe as possible, our hospital system now requires COVID-19 testing prior to any scheduled hospital procedure. Please go to our [facility] location drive thru testing site ([address] [place], [place] [address]) on Friday, 10-31-18 at  2:00PM. There will be multiple testing areas, the first checkpoint being for pre-procedure/surgery testing. Get into the right (yellow)  lane that leads to the PAT testing team. You will not be billed at the time of testing but may receive a bill later depending on your insurance. The approximate cost of the test is $100. You must agree to quarantine from the time of your testing until the procedure date on 11-04-18 . This should include staying at home with ONLY the people you live with. Avoid take-out, grocery store shopping or leaving the house for any non-emergent reason. Failure to have your COVID-19 test done on the date and time you have been scheduled will result in cancellation of procedure. Please call our office at [phone] if you have any questions.   Take Bentyl THREE times a day.  Thank you for entrusting me with your care and for choosing [facility], Dr. Marble Cellar

## 2018-10-16 NOTE — Progress Notes (Signed)
HPI :  78 y/o male here for a follow up visit. He has a history of CAD, NSVT, history of CHF last EF 20-25%, on coumadin, rectal bleeding due to hemorrhoids, IBS, here for a follow up.  Patient has been followed for irritable bowel syndrome for several years.  He has historically declined colonoscopy, he has had screening with Cologuard in 2015 and 2018 which were both negative.  He has had no family history of colon cancer.  Given his cardiac history and for personal preference he has wanted to avoid colonoscopy.  He continues to have abdominal bloating and distention.  He continues to have altered bowel habits sometimes with loose stools sometimes with constipation.  He has roughly 1-2 bowel movements a day.  He denies any blood in his stools.  He has been tried on a variety of regimens in the past without much benefit.  He tried using peppermint supplements/IBgard, he has been treated with rifaximin, he has been tried on a variety of diets include low FODMAP, none of which have provided any benefit.  He thinks he has lost about 5 pounds in recent weeks, not trying to lose weight.  He had a CT scan in February of this year which did not show any acute or concerning pathology to cause this.  He has had rectal bleeding in the past due to hemorrhoids which was banded x1 which resolved that.    He previously had an ultrasound of his right upper quadrant in May 2017 which showed distended gallbladder with some wall thickening at the time without cholelithiasis. He had a follow-up HIDA scan which showed no evidence of acute cholecystitis. GI series last year showed some minimal nonspecific esophageal dysmotility with some mild reflux, and a small duodenal diverticulum, otherwise normal. He has tested negative for celiac disease.    CT scan 03/06/2018 - IMPRESSION: 1. No acute findings. 2. Mildly enlarged prostate, with asymmetric enlargement of left seminal vesicle. 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 Recommend correlation with PSA level, and consider prostate MRI for further evaluation if clinically warranted. 3. Large left posterior bladder diverticulum. 4. Colonic diverticulosis, without radiographic evidence of diverticulitis. 5. Small left inguinal hernia containing only fat. 6. 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   Past Medical History:  Diagnosis Date  . Acute on chronic systolic CHF (congestive heart failure) ([place]) 06/09/2015  . 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 Cardiomyopathy, ischemic 2011   with EF 25-35% by echo  . Coronary artery disease    Hx MI 1992, CABG 1998 , Nuc study 11.2013 large scar but no ischemia  . GERD (gastroesophageal reflux disease)   . H. pylori infection    Hx of   . 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 NSVT (nonsustained ventricular tachycardia) ([place])   . Paroxysmal atrial fibrillation [facility])      Past Surgical History:  Procedure Laterality Date  . BIV UPGRADE N/A 09/10/2016   Procedure: BiV ICD Upgrade;  Surgeon: Deboraha Sprang, MD;  Location: [place] [facility];  Service: Cardiovascular;  Laterality: N/A;  . CATARACT EXTRACTION, BILATERAL    . CORONARY ARTERY BYPASS GRAFT  1998   x 5  . HERNIA REPAIR  1960's   right inguinal  . ICD GENERATOR CHANGE  01/2008, 2018   medtronic, hx+ EP study  . KNEE SURGERY  right  . NASAL SINUS SURGERY  05/31/2010   Dr. Benjamine Mola   Family History  Problem Relation Age of Onset  . Heart disease Father        questionable  . Stroke Mother   . Heart failure Sister   . Healthy Brother   . Healthy Sister   . Parkinsonism Brother   . Colon cancer Neg Hx    Social History   Tobacco Use  . Smoking status: Never Smoker  . Smokeless tobacco: Never Used  Substance Use Topics  . Alcohol use: Yes    Alcohol/week: 2.0  standard drinks    Types: 2 Glasses of wine per week    Comment: socially  . Drug use: No   Current Outpatient Medications  Medication Sig Dispense Refill  . acetaminophen (TYLENOL) 650 MG CR tablet Take 650 mg by mouth 2 (two) times daily.     Marland Kitchen allopurinol (ZYLOPRIM) 300 MG tablet Take 300 mg by mouth daily.    . AMBULATORY NON FORMULARY MEDICATION Medication Name: Hydrocortisone cream 1%: Apply to the rectum once a day for a week, then as needed    . AMBULATORY NON FORMULARY MEDICATION IBgard- Take as directed 16 capsule 0  . AMBULATORY NON FORMULARY MEDICATION Medication Name: FDgard - use as directed 12 capsule 0  . bisoprolol (ZEBETA) 5 MG tablet TAKE 1/2 (ONE-HALF) TABLET BY MOUTH ONCE DAILY 45 tablet 1  . cetirizine (ZYRTEC) 10 MG tablet Take 10 mg by mouth daily.      . Cholecalciferol (VITAMIN D) 2000 units CAPS Take 1-2 capsules by mouth daily.     Marland Kitchen dicyclomine (BENTYL) 10 MG capsule TAKE 1 CAPSULE BY MOUTH EVERY 8 HOURS AS NEEDED FOR SPASMS 90 capsule 3  . hyoscyamine (LEVSIN SL) 0.125 MG SL tablet Place 1 tablet (0.125 mg total) under the tongue every 4 (four) hours as needed. 15 tablet 0  . isosorbide mononitrate (IMDUR) 30 MG 24 hr tablet Take 15 mg by mouth daily.    Marland Kitchen LORazepam (ATIVAN) 1 MG tablet Take 0.25-0.5 mg by mouth as needed for sleep.     Marland Kitchen losartan (COZAAR) 25 MG tablet TAKE 1 TABLET BY MOUTH TWICE A DAY 180 tablet 1  . magnesium oxide (MAG-OX) 400 MG tablet Take 200 mg by mouth daily at 12 noon.     . mometasone (ASMANEX) 220 MCG/INH inhaler Inhale 1 puff into the lungs daily.     . montelukast (SINGULAIR) 10 MG tablet Take 1 tablet (10 mg total) by mouth at bedtime. PLEASE SCHEDULE APPOINTMENT. 30 tablet 0  . nitroGLYCERIN (NITROSTAT) 0.4 MG SL tablet Place 1 tablet (0.4 mg total) under the tongue every 5 (five) minutes as needed for chest pain. 25 tablet 1  . omeprazole (PRILOSEC) 20 MG capsule Take 1 capsule (20 mg total) by mouth every morning. 90 capsule 3   . Polyethyl Glycol-Propyl Glycol (SYSTANE OP) Apply 2-3 drops to eye 3 (three) times daily as needed (dry eyes).    . potassium chloride (K-DUR,KLOR-CON) 10 MEQ tablet Take 0.5 tablets (5 mEq total) by mouth daily. 45 tablet 3  . silodosin (RAPAFLO) 8 MG CAPS capsule Take 8 mg by mouth every other day.    . simvastatin (ZOCOR) 40 MG tablet TAKE 1 TABLET BY MOUTH AT BEDTIME 90 tablet 0  . spironolactone (ALDACTONE) 25 MG tablet Take 1 tablet (25 mg total) by mouth daily. 90 tablet 2  . torsemide (DEMADEX) 20 MG tablet Take 1 tablet (20 mg total)  by mouth daily. 90 tablet 3  . warfarin (COUMADIN) 5 MG tablet Take 1/2 to 1 tablet by mouth daily as directed 90 tablet 3   No current facility-administered medications for this visit.    Allergies  Allergen Reactions  . Amoxicillin Rash    Has patient had a PCN reaction causing immediate rash, facial/tongue/throat swelling, SOB or lightheadedness with hypotension: Yes Has patient had a PCN reaction causing severe rash involving mucus membranes or skin necrosis: No Has patient had a PCN reaction that required hospitalization: No Has patient had a PCN reaction occurring within the last 10 years: No If all of the above answers are "NO", then may proceed with Cephalosporin use.   Marland Kitchen Penicillins Rash    Has patient had a PCN reaction causing immediate rash, facial/tongue/throat swelling, SOB or lightheadedness with hypotension: Yes Has patient had a PCN reaction causing severe rash involving mucus membranes or skin necrosis: No Has patient had a PCN reaction that required hospitalization: No Has patient had a PCN reaction occurring within the last 10 years: No If all of the above answers are "NO", then may proceed with Cephalosporin use.      Review of Systems: All systems reviewed and negative except where noted in HPI.   Lab Results  Component Value Date   WBC 9.7 03/24/2018   HGB 14.4 03/24/2018   HCT 46.3 03/24/2018   MCV 91.7 03/24/2018    PLT 261 03/24/2018    Lab Results  Component Value Date   CREATININE 1.00 03/24/2018   BUN 15 03/24/2018   NA 136 03/24/2018   K 3.7 03/24/2018   CL 100 03/24/2018   CO2 28 03/24/2018    Lab Results  Component Value Date   ALT 16 03/24/2018   AST 24 03/24/2018   ALKPHOS 67 03/24/2018   BILITOT 1.1 03/24/2018     Physical Exam: BP (!) 108/50   Pulse 68   Temp 97.6 F (36.4 C)   Ht 5' 5.5" (1.664 m)   Wt 141 lb 3.2 oz (64 kg)   BMI 23.14 kg/m  Constitutional: Pleasant,well-developed, male in no acute distress. HEENT: Normocephalic and atraumatic. Conjunctivae are normal. No scleral icterus. Neck supple.  Cardiovascular: Normal rate, regular rhythm.  Pulmonary/chest: Effort normal and breath sounds normal. No wheezing, rales or rhonchi. Abdominal: Soft, nondistended, nontender.  There are no masses palpable. No hepatomegaly. Extremities: no edema Lymphadenopathy: No cervical adenopathy noted. Neurological: Alert and oriented to person place and time. Skin: Skin is warm and dry. No rashes noted. Psychiatric: Normal mood and affect. Behavior is normal.   ASSESSMENT AND PLAN: 78 year old male here for reassessment of the following issues:  IBS /Abdominal pain / altered bowels / anticoagulated - longstanding symptoms however with multiple visits to our clinic this past year for persistent symptoms that are failing to respond to conservative therapies.  He has had a CT scan without clear cause and negative Cologuard testing in the past, however I counseled him that Cologuard is not indicated for patient who have symptoms.  He has never had a prior colonoscopy.  I am recommending a colonoscopy to further evaluate these persistent symptoms.  I discussed risks and benefits of colonoscopy and anesthesia with him.  His last ejection fraction is 20 to 25%, he is higher than average risk for anesthesia, in his case would need to be done at [facility].  He denies any  cardiopulmonary symptoms at present and wants to proceed with the exam.  We  will need to reach out to his cardiologist to see if it is okay to hold his Coumadin for 5 days prior to the exam, or if he requires Lovenox bridge.  In interim he does find some benefit with Bentyl, he is using it occasionally, recommend he use it 3 times a day for ongoing discomfort.  Further recommendations pending the results.  He agreed with the plan  Henderson Cellar, MD [facility]

## 2018-10-16 NOTE — Telephone Encounter (Signed)
[facility] Pre-operative Risk Assessment     Request for surgical clearance:   Endoscopy Procedure  What type of surgery is being performed?   COLONOSCOPY  When is this surgery scheduled?   11-04-2018  What type of clearance is required ?   Pharmacy  Are there any medications that need to be held prior to surgery and how long? COUMADIN 5 DAYS  Practice name and name of physician performing surgery?   [facility]  What is your office phone and fax number?   Phone- [phone]  Fax- [phone] ATTN: Lemar Lofty, CMA    Anesthesia type (None, local, MAC, general) ?    MAC  Thank you!

## 2018-10-16 NOTE — H&P (View-Only) (Signed)
HPI :  78 y/o male here for a follow up visit. He has a history of CAD, NSVT, history of CHF last EF 20-25%, on coumadin, rectal bleeding due to hemorrhoids, IBS, here for a follow up.  Patient has been followed for irritable bowel syndrome for several years.  He has historically declined colonoscopy, he has had screening with Cologuard in 2015 and 2018 which were both negative.  He has had no family history of colon cancer.  Given his cardiac history and for personal preference he has wanted to avoid colonoscopy.  He continues to have abdominal bloating and distention.  He continues to have altered bowel habits sometimes with loose stools sometimes with constipation.  He has roughly 1-2 bowel movements a day.  He denies any blood in his stools.  He has been tried on a variety of regimens in the past without much benefit.  He tried using peppermint supplements/IBgard, he has been treated with rifaximin, he has been tried on a variety of diets include low FODMAP, none of which have provided any benefit.  He thinks he has lost about 5 pounds in recent weeks, not trying to lose weight.  He had a CT scan in February of this year which did not show any acute or concerning pathology to cause this.  He has had rectal bleeding in the past due to hemorrhoids which was banded x1 which resolved that.    He previously had an ultrasound of his right upper quadrant in May 2017 which showed distended gallbladder with some wall thickening at the time without cholelithiasis. He had a follow-up HIDA scan which showed no evidence of acute cholecystitis. GI series last year showed some minimal nonspecific esophageal dysmotility with some mild reflux, and a small duodenal diverticulum, otherwise normal. He has tested negative for celiac disease.    CT scan 03/06/2018 - IMPRESSION: 1. No acute findings. 2. Mildly enlarged prostate, with asymmetric enlargement of left seminal vesicle. 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 Recommend correlation with PSA level, and consider prostate MRI for further evaluation if clinically warranted. 3. Large left posterior bladder diverticulum. 4. Colonic diverticulosis, without radiographic evidence of diverticulitis. 5. Small left inguinal hernia containing only fat. 6. 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   Past Medical History:  Diagnosis Date  . Acute on chronic systolic CHF (congestive heart failure) ([place]) 06/09/2015  . 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 Cardiomyopathy, ischemic 2011   with EF 25-35% by echo  . Coronary artery disease    Hx MI 1992, CABG 1998 , Nuc study 11.2013 large scar but no ischemia  . GERD (gastroesophageal reflux disease)   . H. pylori infection    Hx of   . 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 NSVT (nonsustained ventricular tachycardia) ([place])   . Paroxysmal atrial fibrillation [facility])      Past Surgical History:  Procedure Laterality Date  . BIV UPGRADE N/A 09/10/2016   Procedure: BiV ICD Upgrade;  Surgeon: Deboraha Sprang, MD;  Location: [place] [facility];  Service: Cardiovascular;  Laterality: N/A;  . CATARACT EXTRACTION, BILATERAL    . CORONARY ARTERY BYPASS GRAFT  1998   x 5  . HERNIA REPAIR  1960's   right inguinal  . ICD GENERATOR CHANGE  01/2008, 2018   medtronic, hx+ EP study  . KNEE SURGERY  right  . NASAL SINUS SURGERY  05/31/2010   Dr. Benjamine Mola   Family History  Problem Relation Age of Onset  . Heart disease Father        questionable  . Stroke Mother   . Heart failure Sister   . Healthy Brother   . Healthy Sister   . Parkinsonism Brother   . Colon cancer Neg Hx    Social History   Tobacco Use  . Smoking status: Never Smoker  . Smokeless tobacco: Never Used  Substance Use Topics  . Alcohol use: Yes    Alcohol/week: 2.0  standard drinks    Types: 2 Glasses of wine per week    Comment: socially  . Drug use: No   Current Outpatient Medications  Medication Sig Dispense Refill  . acetaminophen (TYLENOL) 650 MG CR tablet Take 650 mg by mouth 2 (two) times daily.     Marland Kitchen allopurinol (ZYLOPRIM) 300 MG tablet Take 300 mg by mouth daily.    . AMBULATORY NON FORMULARY MEDICATION Medication Name: Hydrocortisone cream 1%: Apply to the rectum once a day for a week, then as needed    . AMBULATORY NON FORMULARY MEDICATION IBgard- Take as directed 16 capsule 0  . AMBULATORY NON FORMULARY MEDICATION Medication Name: FDgard - use as directed 12 capsule 0  . bisoprolol (ZEBETA) 5 MG tablet TAKE 1/2 (ONE-HALF) TABLET BY MOUTH ONCE DAILY 45 tablet 1  . cetirizine (ZYRTEC) 10 MG tablet Take 10 mg by mouth daily.      . Cholecalciferol (VITAMIN D) 2000 units CAPS Take 1-2 capsules by mouth daily.     Marland Kitchen dicyclomine (BENTYL) 10 MG capsule TAKE 1 CAPSULE BY MOUTH EVERY 8 HOURS AS NEEDED FOR SPASMS 90 capsule 3  . hyoscyamine (LEVSIN SL) 0.125 MG SL tablet Place 1 tablet (0.125 mg total) under the tongue every 4 (four) hours as needed. 15 tablet 0  . isosorbide mononitrate (IMDUR) 30 MG 24 hr tablet Take 15 mg by mouth daily.    Marland Kitchen LORazepam (ATIVAN) 1 MG tablet Take 0.25-0.5 mg by mouth as needed for sleep.     Marland Kitchen losartan (COZAAR) 25 MG tablet TAKE 1 TABLET BY MOUTH TWICE A DAY 180 tablet 1  . magnesium oxide (MAG-OX) 400 MG tablet Take 200 mg by mouth daily at 12 noon.     . mometasone (ASMANEX) 220 MCG/INH inhaler Inhale 1 puff into the lungs daily.     . montelukast (SINGULAIR) 10 MG tablet Take 1 tablet (10 mg total) by mouth at bedtime. PLEASE SCHEDULE APPOINTMENT. 30 tablet 0  . nitroGLYCERIN (NITROSTAT) 0.4 MG SL tablet Place 1 tablet (0.4 mg total) under the tongue every 5 (five) minutes as needed for chest pain. 25 tablet 1  . omeprazole (PRILOSEC) 20 MG capsule Take 1 capsule (20 mg total) by mouth every morning. 90 capsule 3   . Polyethyl Glycol-Propyl Glycol (SYSTANE OP) Apply 2-3 drops to eye 3 (three) times daily as needed (dry eyes).    . potassium chloride (K-DUR,KLOR-CON) 10 MEQ tablet Take 0.5 tablets (5 mEq total) by mouth daily. 45 tablet 3  . silodosin (RAPAFLO) 8 MG CAPS capsule Take 8 mg by mouth every other day.    . simvastatin (ZOCOR) 40 MG tablet TAKE 1 TABLET BY MOUTH AT BEDTIME 90 tablet 0  . spironolactone (ALDACTONE) 25 MG tablet Take 1 tablet (25 mg total) by mouth daily. 90 tablet 2  . torsemide (DEMADEX) 20 MG tablet Take 1 tablet (20 mg total)  by mouth daily. 90 tablet 3  . warfarin (COUMADIN) 5 MG tablet Take 1/2 to 1 tablet by mouth daily as directed 90 tablet 3   No current facility-administered medications for this visit.    Allergies  Allergen Reactions  . Amoxicillin Rash    Has patient had a PCN reaction causing immediate rash, facial/tongue/throat swelling, SOB or lightheadedness with hypotension: Yes Has patient had a PCN reaction causing severe rash involving mucus membranes or skin necrosis: No Has patient had a PCN reaction that required hospitalization: No Has patient had a PCN reaction occurring within the last 10 years: No If all of the above answers are "NO", then may proceed with Cephalosporin use.   Marland Kitchen Penicillins Rash    Has patient had a PCN reaction causing immediate rash, facial/tongue/throat swelling, SOB or lightheadedness with hypotension: Yes Has patient had a PCN reaction causing severe rash involving mucus membranes or skin necrosis: No Has patient had a PCN reaction that required hospitalization: No Has patient had a PCN reaction occurring within the last 10 years: No If all of the above answers are "NO", then may proceed with Cephalosporin use.      Review of Systems: All systems reviewed and negative except where noted in HPI.   Lab Results  Component Value Date   WBC 9.7 03/24/2018   HGB 14.4 03/24/2018   HCT 46.3 03/24/2018   MCV 91.7 03/24/2018    PLT 261 03/24/2018    Lab Results  Component Value Date   CREATININE 1.00 03/24/2018   BUN 15 03/24/2018   NA 136 03/24/2018   K 3.7 03/24/2018   CL 100 03/24/2018   CO2 28 03/24/2018    Lab Results  Component Value Date   ALT 16 03/24/2018   AST 24 03/24/2018   ALKPHOS 67 03/24/2018   BILITOT 1.1 03/24/2018     Physical Exam: BP (!) 108/50   Pulse 68   Temp 97.6 F (36.4 C)   Ht 5' 5.5" (1.664 m)   Wt 141 lb 3.2 oz (64 kg)   BMI 23.14 kg/m  Constitutional: Pleasant,well-developed, male in no acute distress. HEENT: Normocephalic and atraumatic. Conjunctivae are normal. No scleral icterus. Neck supple.  Cardiovascular: Normal rate, regular rhythm.  Pulmonary/chest: Effort normal and breath sounds normal. No wheezing, rales or rhonchi. Abdominal: Soft, nondistended, nontender.  There are no masses palpable. No hepatomegaly. Extremities: no edema Lymphadenopathy: No cervical adenopathy noted. Neurological: Alert and oriented to person place and time. Skin: Skin is warm and dry. No rashes noted. Psychiatric: Normal mood and affect. Behavior is normal.   ASSESSMENT AND PLAN: 78 year old male here for reassessment of the following issues:  IBS /Abdominal pain / altered bowels / anticoagulated - longstanding symptoms however with multiple visits to our clinic this past year for persistent symptoms that are failing to respond to conservative therapies.  He has had a CT scan without clear cause and negative Cologuard testing in the past, however I counseled him that Cologuard is not indicated for patient who have symptoms.  He has never had a prior colonoscopy.  I am recommending a colonoscopy to further evaluate these persistent symptoms.  I discussed risks and benefits of colonoscopy and anesthesia with him.  His last ejection fraction is 20 to 25%, he is higher than average risk for anesthesia, in his case would need to be done at [facility].  He denies any  cardiopulmonary symptoms at present and wants to proceed with the exam.  We  will need to reach out to his cardiologist to see if it is okay to hold his Coumadin for 5 days prior to the exam, or if he requires Lovenox bridge.  In interim he does find some benefit with Bentyl, he is using it occasionally, recommend he use it 3 times a day for ongoing discomfort.  Further recommendations pending the results.  He agreed with the plan  [place] Cellar, MD [facility]

## 2018-10-17 ENCOUNTER — Telehealth: Payer: Self-pay | Admitting: Gastroenterology

## 2018-10-17 ENCOUNTER — Telehealth: Payer: Self-pay | Admitting: General Surgery

## 2018-10-17 NOTE — Telephone Encounter (Signed)
   Primary Cardiologist: Shelva Majestic, MD  Chart reviewed as part of pre-operative protocol coverage. Patient was contacted 10/17/2018 in reference to pre-operative risk assessment for pending surgery as outlined below.  JUANANTONIO CAMINERO was last seen on 07/15/2018  by Dr. [name].   IMERE CIARROCCHI was noted to be doing well at that time.  He has a diagnosis of afib on warfarin for anticoagulation with a CHADS2-VASc score of  4 (CHF, AGE, CAD, AGE)  Per office protocol, patient can hold warfarin for 5 days prior to procedure.    Patient does NOT need lovenox bridge.  Therefore, based on ACC/AHA guidelines, the patient would be at acceptable risk for the planned procedure without further cardiovascular testing.   I will route this recommendation to the requesting party via Epic fax function and remove from pre-op pool.  Please call with questions.  Kathyrn Drown, NP 10/17/2018, 12:38 PM

## 2018-10-17 NOTE — Telephone Encounter (Signed)
.  A user error has taken place: error

## 2018-10-17 NOTE — Telephone Encounter (Addendum)
Patient with diagnosis of afib on warfarin for anticoagulation.    Procedure: COLONOSCOPY Date of procedure: 11/04/2018  CHADS2-VASc score of  4 (CHF, AGE, CAD, AGE)  Per office protocol, patient can hold warfarin for 5 days prior to procedure.    Patient does NOT need lovenox bridge.

## 2018-10-17 NOTE — Telephone Encounter (Signed)
Pt requested a call back to discuss colonoscopy prep.  °

## 2018-10-20 NOTE — Telephone Encounter (Signed)
See CLEARANCE telephone note from today

## 2018-10-20 NOTE — Telephone Encounter (Signed)
Called and spoke to pt.  He understands to hold his Coumadin starting on October 1st for his October 6th procedure. He also confirmed that he knows he has to be Covid tested on Friday, 10-2 at 2:00pm.

## 2018-10-23 ENCOUNTER — Ambulatory Visit (INDEPENDENT_AMBULATORY_CARE_PROVIDER_SITE_OTHER): Payer: Medicare Other | Admitting: *Deleted

## 2018-10-23 DIAGNOSIS — J301 Allergic rhinitis due to pollen: Secondary | ICD-10-CM | POA: Diagnosis not present

## 2018-10-23 DIAGNOSIS — J3081 Allergic rhinitis due to animal (cat) (dog) hair and dander: Secondary | ICD-10-CM | POA: Diagnosis not present

## 2018-10-23 DIAGNOSIS — I255 Ischemic cardiomyopathy: Secondary | ICD-10-CM

## 2018-10-23 DIAGNOSIS — J4540 Moderate persistent asthma, uncomplicated: Secondary | ICD-10-CM | POA: Diagnosis not present

## 2018-10-23 DIAGNOSIS — J3089 Other allergic rhinitis: Secondary | ICD-10-CM | POA: Diagnosis not present

## 2018-10-23 LAB — CUP PACEART REMOTE DEVICE CHECK
Battery Remaining Longevity: 74 mo
Battery Voltage: 2.97 V
Brady Statistic AP VP Percent: 90.88 %
Brady Statistic AP VS Percent: 1.63 %
Brady Statistic AS VP Percent: 7.23 %
Brady Statistic AS VS Percent: 0.25 %
Brady Statistic RA Percent Paced: 91.82 %
Brady Statistic RV Percent Paced: 0.6 %
Date Time Interrogation Session: 20200924073623
HighPow Impedance: 45 Ohm
HighPow Impedance: 69 Ohm
Implantable Lead Implant Date: 20010806
Implantable Lead Implant Date: 20180813
Implantable Lead Implant Date: 20180813
Implantable Lead Location: 753858
Implantable Lead Location: 753859
Implantable Lead Location: 753860
Implantable Lead Model: 147
Implantable Lead Model: 5092
Implantable Lead Serial Number: 104581
Implantable Pulse Generator Implant Date: 20180813
Lead Channel Impedance Value: 175.622
Lead Channel Impedance Value: 188.1 Ohm
Lead Channel Impedance Value: 195.429
Lead Channel Impedance Value: 201.488
Lead Channel Impedance Value: 218.087
Lead Channel Impedance Value: 342 Ohm
Lead Channel Impedance Value: 361 Ohm
Lead Channel Impedance Value: 418 Ohm
Lead Channel Impedance Value: 418 Ohm
Lead Channel Impedance Value: 456 Ohm
Lead Channel Impedance Value: 608 Ohm
Lead Channel Impedance Value: 665 Ohm
Lead Channel Impedance Value: 703 Ohm
Lead Channel Impedance Value: 703 Ohm
Lead Channel Impedance Value: 703 Ohm
Lead Channel Impedance Value: 703 Ohm
Lead Channel Impedance Value: 703 Ohm
Lead Channel Impedance Value: 760 Ohm
Lead Channel Pacing Threshold Amplitude: 0.5 V
Lead Channel Pacing Threshold Amplitude: 1.375 V
Lead Channel Pacing Threshold Amplitude: 1.5 V
Lead Channel Pacing Threshold Pulse Width: 0.4 ms
Lead Channel Pacing Threshold Pulse Width: 0.4 ms
Lead Channel Pacing Threshold Pulse Width: 0.4 ms
Lead Channel Sensing Intrinsic Amplitude: 18.125 mV
Lead Channel Sensing Intrinsic Amplitude: 18.125 mV
Lead Channel Sensing Intrinsic Amplitude: 2.375 mV
Lead Channel Sensing Intrinsic Amplitude: 2.375 mV
Lead Channel Setting Pacing Amplitude: 1.75 V
Lead Channel Setting Pacing Amplitude: 2 V
Lead Channel Setting Pacing Amplitude: 3 V
Lead Channel Setting Pacing Pulse Width: 0.4 ms
Lead Channel Setting Pacing Pulse Width: 0.4 ms
Lead Channel Setting Sensing Sensitivity: 0.3 mV

## 2018-10-28 ENCOUNTER — Encounter: Payer: Self-pay | Admitting: Internal Medicine

## 2018-10-28 ENCOUNTER — Other Ambulatory Visit: Payer: Self-pay

## 2018-10-28 ENCOUNTER — Ambulatory Visit (INDEPENDENT_AMBULATORY_CARE_PROVIDER_SITE_OTHER): Payer: Medicare Other | Admitting: Internal Medicine

## 2018-10-28 VITALS — BP 110/66 | HR 77 | Ht 65.5 in | Wt 139.8 lb

## 2018-10-28 DIAGNOSIS — Z95810 Presence of automatic (implantable) cardiac defibrillator: Secondary | ICD-10-CM

## 2018-10-28 DIAGNOSIS — I472 Ventricular tachycardia: Secondary | ICD-10-CM

## 2018-10-28 DIAGNOSIS — I5032 Chronic diastolic (congestive) heart failure: Secondary | ICD-10-CM | POA: Diagnosis not present

## 2018-10-28 DIAGNOSIS — I509 Heart failure, unspecified: Secondary | ICD-10-CM | POA: Diagnosis not present

## 2018-10-28 DIAGNOSIS — I255 Ischemic cardiomyopathy: Secondary | ICD-10-CM

## 2018-10-28 DIAGNOSIS — I480 Paroxysmal atrial fibrillation: Secondary | ICD-10-CM

## 2018-10-28 DIAGNOSIS — I4729 Other ventricular tachycardia: Secondary | ICD-10-CM

## 2018-10-28 NOTE — Patient Instructions (Signed)

## 2018-10-28 NOTE — Progress Notes (Signed)
Patient Care Team: [name], MD as PCP - General (Family Medicine) Troy Sine, MD as PCP - Cardiology (Cardiology)   HPI  [name] is a 78 y.o. 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 He has a history of bypass surgery.  His last nuclear perfusion study was in November 2013 which showed a large area of scar in the LAD territory (extent 44%) involving the mid to apical anterior, apical and infero-apical to mid infero-septal and apical lateral wall without associated ischemia.  DATE TEST EF   3/16 Echo 20-25 %   12/17 Echo  20-25 %   3/19 Echo  20-25%     Date Cr K Hgb  8/18   12.6  6/19 0.89 3.5    2/20 1.00 3.7 14.4     He has a history of paroxysmal atrial fibrillation and is on warfarin. Hospitalized 5/16 and was started on dofetilide. This was subsequently discontinued     He underwent CRT upgrade 8/18.  This was complicated by a wound hematoma  The patient denies chest pain, shortness of breath, nocturnal dyspnea or peripheral edema.  There have been no palpitations, lightheadedness or syncope.    Wants to play some tennis  Has been having problems with IBS and has anticipated colonoscopy; the biggest symptom has been bloating associated with dyspnea -- no      Past Medical History:  Diagnosis Date  . Acute on chronic systolic CHF (congestive heart failure) ([place]) 06/09/2015  . 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 Cardiomyopathy, ischemic 2011   with EF 25-35% by echo  . Coronary artery disease    Hx MI 1992, CABG 1998 , Nuc study 11.2013 large scar but no ischemia  . GERD (gastroesophageal reflux disease)   . H. pylori infection    Hx of   . 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 NSVT (nonsustained ventricular tachycardia) ([place])   . Paroxysmal atrial fibrillation [facility])     Past Surgical  History:  Procedure Laterality Date  . BIV UPGRADE N/A 09/10/2016   Procedure: BiV ICD Upgrade;  Surgeon: Deboraha Sprang, MD;  Location: [facility];  Service: Cardiovascular;  Laterality: N/A;  . CATARACT EXTRACTION, BILATERAL    . CORONARY ARTERY BYPASS GRAFT  1998   x 5  . HERNIA REPAIR  1960's   right inguinal  . ICD GENERATOR CHANGE  01/2008, 2018   medtronic, hx+ EP study  . KNEE SURGERY     right  . NASAL SINUS SURGERY  05/31/2010   Dr. Benjamine Mola    Current Outpatient Medications  Medication Sig Dispense Refill  . acetaminophen (TYLENOL) 650 MG CR tablet Take 650 mg by mouth 2 (two) times daily.     Marland Kitchen allopurinol (ZYLOPRIM) 300 MG tablet Take 300 mg by mouth daily.    . AMBULATORY NON FORMULARY MEDICATION Medication Name: Hydrocortisone cream 1%: Apply to the rectum once a day for a week, then as needed    . AMBULATORY NON FORMULARY MEDICATION IBgard- Take as directed 16 capsule 0  . AMBULATORY NON FORMULARY MEDICATION Medication Name: FDgard - use as directed 12 capsule 0  . bisoprolol (ZEBETA) 5 MG tablet TAKE 1/2 (ONE-HALF) TABLET BY MOUTH ONCE DAILY 45 tablet 1  . cetirizine (ZYRTEC) 10 MG tablet Take 10 mg by mouth daily.      Marland Kitchen  Cholecalciferol (VITAMIN D) 2000 units CAPS Take 1-2 capsules by mouth daily.     Marland Kitchen dicyclomine (BENTYL) 10 MG capsule Take 1 capsule (10 mg total) by mouth 3 (three) times daily. 270 capsule 1  . hyoscyamine (LEVSIN SL) 0.125 MG SL tablet Place 1 tablet (0.125 mg total) under the tongue every 4 (four) hours as needed. 15 tablet 0  . isosorbide mononitrate (IMDUR) 30 MG 24 hr tablet Take 15 mg by mouth daily.    Marland Kitchen LORazepam (ATIVAN) 1 MG tablet Take 0.25-0.5 mg by mouth as needed for sleep.     Marland Kitchen losartan (COZAAR) 25 MG tablet TAKE 1 TABLET BY MOUTH TWICE A DAY 180 tablet 1  . magnesium oxide (MAG-OX) 400 MG tablet Take 200 mg by mouth daily at 12 noon.     . mometasone (ASMANEX) 220 MCG/INH inhaler Inhale 1 puff into the lungs daily.     .  montelukast (SINGULAIR) 10 MG tablet Take 1 tablet (10 mg total) by mouth at bedtime. PLEASE SCHEDULE APPOINTMENT. 30 tablet 0  . nitroGLYCERIN (NITROSTAT) 0.4 MG SL tablet Place 1 tablet (0.4 mg total) under the tongue every 5 (five) minutes as needed for chest pain. 25 tablet 1  . omeprazole (PRILOSEC) 20 MG capsule Take 1 capsule (20 mg total) by mouth every morning. 90 capsule 3  . Polyethyl Glycol-Propyl Glycol (SYSTANE OP) Apply 2-3 drops to eye 3 (three) times daily as needed (dry eyes).    . potassium chloride (K-DUR,KLOR-CON) 10 MEQ tablet Take 0.5 tablets (5 mEq total) by mouth daily. 45 tablet 3  . silodosin (RAPAFLO) 8 MG CAPS capsule Take 8 mg by mouth every other day.    . simvastatin (ZOCOR) 40 MG tablet TAKE 1 TABLET BY MOUTH AT BEDTIME 90 tablet 0  . spironolactone (ALDACTONE) 25 MG tablet Take 1 tablet (25 mg total) by mouth daily. 90 tablet 2  . torsemide (DEMADEX) 20 MG tablet Take 1 tablet (20 mg total) by mouth daily. 90 tablet 3  . warfarin (COUMADIN) 5 MG tablet Take 1/2 to 1 tablet by mouth daily as directed 90 tablet 3   No current facility-administered medications for this visit.     Allergies  Allergen Reactions  . Amoxicillin Rash    Has patient had a PCN reaction causing immediate rash, facial/tongue/throat swelling, SOB or lightheadedness with hypotension: Yes Has patient had a PCN reaction causing severe rash involving mucus membranes or skin necrosis: No Has patient had a PCN reaction that required hospitalization: No Has patient had a PCN reaction occurring within the last 10 years: No If all of the above answers are "NO", then may proceed with Cephalosporin use.   Marland Kitchen Penicillins Rash    Has patient had a PCN reaction causing immediate rash, facial/tongue/throat swelling, SOB or lightheadedness with hypotension: Yes Has patient had a PCN reaction causing severe rash involving mucus membranes or skin necrosis: No Has patient had a PCN reaction that required  hospitalization: No Has patient had a PCN reaction occurring within the last 10 years: No If all of the above answers are "NO", then may proceed with Cephalosporin use.     Review of Systems negative except from HPI and PMH  Physical Exam BP 110/66   Pulse 77   Ht 5' 5.5" (1.664 m)   Wt 139 lb 12.8 oz (63.4 kg)   SpO2 97%   BMI 22.91 kg/m  Well developed and well nourished in no acute distress HENT normal Neck supple with JVP-flat  Clear Device pocket well healed; without hematoma or erythema.  There is no tethering  Regular rate and rhythm, no  gallop No murmur Abd-soft with active BS No Clubbing cyanosis edema Skin-warm and dry A & Oriented  Grossly normal sensory and motor function  ECG AV paced   Assessment and  Plan  Atrial fibrillation-paroxysmal  Sinus bradycardia/ chronotropic incompetence  Ischemic cardiomyopathy  IVCD  Congestive heart failure-chronic-systolic  CRT ICD Medtronic  The patient's device was interrogated.  The information was reviewed. No changes were made in the programming.       No intercurrent Ventricular tachycardia  No intercurrent atrial fibrillation or flutter  Euvolemic continue current meds

## 2018-10-30 ENCOUNTER — Encounter: Payer: Self-pay | Admitting: Cardiology

## 2018-10-30 NOTE — Progress Notes (Signed)
Remote ICD transmission.   

## 2018-10-31 ENCOUNTER — Other Ambulatory Visit: Payer: Self-pay

## 2018-10-31 ENCOUNTER — Encounter (HOSPITAL_COMMUNITY): Payer: Self-pay | Admitting: Emergency Medicine

## 2018-10-31 ENCOUNTER — Telehealth: Payer: Self-pay | Admitting: Gastroenterology

## 2018-10-31 ENCOUNTER — Other Ambulatory Visit (HOSPITAL_COMMUNITY)
Admission: RE | Admit: 2018-10-31 | Discharge: 2018-10-31 | Disposition: A | Discharge status: Home / Self Care | Payer: Medicare Other | Source: Ambulatory Visit | Attending: Gastroenterology | Admitting: Gastroenterology

## 2018-10-31 DIAGNOSIS — Z20828 Contact with and (suspected) exposure to other viral communicable diseases: Secondary | ICD-10-CM | POA: Insufficient documentation

## 2018-10-31 DIAGNOSIS — Z01812 Encounter for preprocedural laboratory examination: Secondary | ICD-10-CM | POA: Insufficient documentation

## 2018-10-31 NOTE — Progress Notes (Signed)
Left message for patient to call back regarding colonoscopy on 11/04/2018 with Dr Havery Moros.

## 2018-10-31 NOTE — Progress Notes (Signed)
Pre-op endo call completed.  Patient states he was instructed to hold his coumadin for procedure and his last dose was 9-30.  Denies any acute cardiac sx at tome of call.  ICD orders faxed by Kara Pacer, RN to [place] , [place]  Endo fax #  Given as return fax

## 2018-10-31 NOTE — Telephone Encounter (Signed)
Called patient back and let him know that [name] is recommending he take the  Dicyclomine 3 times a day if he is having discomfort. If he is not, he does not have to take it daily. Answered questions he had about his Colonoscopy prep and diet restrictions.

## 2018-11-02 LAB — NOVEL CORONAVIRUS, NAA (HOSP ORDER, SEND-OUT TO REF LAB; TAT 18-24 HRS): SARS-CoV-2, NAA: NOT DETECTED

## 2018-11-03 ENCOUNTER — Telehealth: Payer: Self-pay

## 2018-11-03 NOTE — Progress Notes (Signed)
Left message for pre call

## 2018-11-03 NOTE — Telephone Encounter (Signed)
Called patient and let him know that because of a cancellation, his colonoscopy is going to start 30 mins. Later at 10:45am and he would need to be there at 9:15am. Apologized for the change

## 2018-11-03 NOTE — Telephone Encounter (Signed)
Called Lisa back in Endo and let her know [name] does not think an INR is necessary  Since the patient will have been off the Coumadin for 5 days

## 2018-11-03 NOTE — Telephone Encounter (Signed)
Lisa from [facility] [facility] called and wanted to know if [name] wanted an INR drawn on the patient for his Colonoscopy on 11/04/18. He has held his Coumadin since 10/30/18 but in case biopsies are done she wants to know if an INR should be drawn. Please advise.

## 2018-11-03 NOTE — Telephone Encounter (Signed)
If he has been off for 5 days, no need to check INR, he should be okay. Thanks

## 2018-11-04 ENCOUNTER — Encounter (HOSPITAL_COMMUNITY)
Admission: RE | Disposition: A | Discharge status: Home / Self Care | Payer: Self-pay | Source: Home / Self Care | Attending: Gastroenterology

## 2018-11-04 ENCOUNTER — Ambulatory Visit (HOSPITAL_COMMUNITY): Payer: Medicare Other | Admitting: Registered Nurse

## 2018-11-04 ENCOUNTER — Other Ambulatory Visit: Payer: Self-pay

## 2018-11-04 ENCOUNTER — Encounter (HOSPITAL_COMMUNITY): Payer: Self-pay | Admitting: *Deleted

## 2018-11-04 ENCOUNTER — Ambulatory Visit (HOSPITAL_COMMUNITY)
Admission: RE | Admit: 2018-11-04 | Discharge: 2018-11-04 | Disposition: A | Discharge status: Home / Self Care | Payer: Medicare Other | Attending: Gastroenterology | Admitting: Gastroenterology

## 2018-11-04 DIAGNOSIS — K648 Other hemorrhoids: Secondary | ICD-10-CM | POA: Insufficient documentation

## 2018-11-04 DIAGNOSIS — K5520 Angiodysplasia of colon without hemorrhage: Secondary | ICD-10-CM | POA: Insufficient documentation

## 2018-11-04 DIAGNOSIS — K219 Gastro-esophageal reflux disease without esophagitis: Secondary | ICD-10-CM | POA: Insufficient documentation

## 2018-11-04 DIAGNOSIS — Z951 Presence of aortocoronary bypass graft: Secondary | ICD-10-CM | POA: Diagnosis not present

## 2018-11-04 DIAGNOSIS — I2510 Atherosclerotic heart disease of native coronary artery without angina pectoris: Secondary | ICD-10-CM | POA: Insufficient documentation

## 2018-11-04 DIAGNOSIS — K5730 Diverticulosis of large intestine without perforation or abscess without bleeding: Secondary | ICD-10-CM | POA: Insufficient documentation

## 2018-11-04 DIAGNOSIS — Z95810 Presence of automatic (implantable) cardiac defibrillator: Secondary | ICD-10-CM | POA: Insufficient documentation

## 2018-11-04 DIAGNOSIS — R109 Unspecified abdominal pain: Secondary | ICD-10-CM

## 2018-11-04 DIAGNOSIS — K589 Irritable bowel syndrome without diarrhea: Secondary | ICD-10-CM

## 2018-11-04 DIAGNOSIS — Z7951 Long term (current) use of inhaled steroids: Secondary | ICD-10-CM | POA: Insufficient documentation

## 2018-11-04 DIAGNOSIS — R194 Change in bowel habit: Secondary | ICD-10-CM

## 2018-11-04 DIAGNOSIS — Z79899 Other long term (current) drug therapy: Secondary | ICD-10-CM | POA: Insufficient documentation

## 2018-11-04 DIAGNOSIS — I480 Paroxysmal atrial fibrillation: Secondary | ICD-10-CM | POA: Diagnosis not present

## 2018-11-04 DIAGNOSIS — D126 Benign neoplasm of colon, unspecified: Secondary | ICD-10-CM

## 2018-11-04 DIAGNOSIS — K635 Polyp of colon: Secondary | ICD-10-CM

## 2018-11-04 DIAGNOSIS — D123 Benign neoplasm of transverse colon: Secondary | ICD-10-CM | POA: Diagnosis not present

## 2018-11-04 DIAGNOSIS — Z7901 Long term (current) use of anticoagulants: Secondary | ICD-10-CM | POA: Insufficient documentation

## 2018-11-04 DIAGNOSIS — K31819 Angiodysplasia of stomach and duodenum without bleeding: Secondary | ICD-10-CM | POA: Diagnosis not present

## 2018-11-04 DIAGNOSIS — J45909 Unspecified asthma, uncomplicated: Secondary | ICD-10-CM | POA: Diagnosis not present

## 2018-11-04 DIAGNOSIS — K5750 Diverticulosis of both small and large intestine without perforation or abscess without bleeding: Secondary | ICD-10-CM | POA: Diagnosis not present

## 2018-11-04 DIAGNOSIS — I5023 Acute on chronic systolic (congestive) heart failure: Secondary | ICD-10-CM | POA: Diagnosis not present

## 2018-11-04 DIAGNOSIS — I252 Old myocardial infarction: Secondary | ICD-10-CM | POA: Diagnosis not present

## 2018-11-04 HISTORY — PX: POLYPECTOMY: SHX5525

## 2018-11-04 HISTORY — PX: COLONOSCOPY WITH PROPOFOL: SHX5780

## 2018-11-04 HISTORY — PX: BIOPSY: SHX5522

## 2018-11-04 SURGERY — COLONOSCOPY WITH PROPOFOL
Anesthesia: Monitor Anesthesia Care

## 2018-11-04 MED ORDER — LACTATED RINGERS IV SOLN
INTRAVENOUS | Status: AC | PRN
  Administered 2018-11-04: 1000 mL via INTRAVENOUS

## 2018-11-04 MED ORDER — PROPOFOL 500 MG/50ML IV EMUL
INTRAVENOUS | Status: DC | PRN
  Administered 2018-11-04: 120 ug/kg/min via INTRAVENOUS

## 2018-11-04 MED ORDER — PROPOFOL 10 MG/ML IV BOLUS
INTRAVENOUS | Status: DC | PRN
  Administered 2018-11-04: 10 mg via INTRAVENOUS

## 2018-11-04 MED ORDER — LACTATED RINGERS IV SOLN
INTRAVENOUS | Status: DC | PRN
  Administered 2018-11-04: 10:00:00 via INTRAVENOUS

## 2018-11-04 MED ORDER — SODIUM CHLORIDE 0.9 % IV SOLN: INTRAVENOUS | Status: DC

## 2018-11-04 MED ORDER — PROPOFOL 10 MG/ML IV BOLUS
INTRAVENOUS | Status: AC
  Filled 2018-11-04: qty 40

## 2018-11-04 MED ORDER — EPHEDRINE SULFATE-NACL 50-0.9 MG/10ML-% IV SOSY
PREFILLED_SYRINGE | INTRAVENOUS | Status: DC | PRN
  Administered 2018-11-04 (×2): 5 mg via INTRAVENOUS

## 2018-11-04 SURGICAL SUPPLY — 22 items

## 2018-11-04 NOTE — Interval H&P Note (Signed)
History and Physical Interval Note:  11/04/2018 10:08 AM  [name]  has presented today for surgery, with the diagnosis of IBS, abdominal pain, alterede bowel habits.  The various methods of treatment have been discussed with the patient and family. After consideration of risks, benefits and other options for treatment, the patient has consented to  Procedure(s): COLONOSCOPY WITH PROPOFOL (N/A) as a surgical intervention.  The patient's history has been reviewed, patient examined, no change in status, stable for surgery.  I have reviewed the patient's chart and labs.  Questions were answered to the patient's satisfaction.     Grill

## 2018-11-04 NOTE — Op Note (Signed)
[facility] Patient Name: [name] Procedure Date: 11/04/2018 MRN: [id number] Attending MD: Carlota Raspberry. Havery Moros , MD Date of Birth: [birth year] CSN: [id number] Age: 78 Admit Type: Outpatient Procedure:                Colonoscopy Indications:              This is the patient's first colonoscopy, Suspected                            irritable bowel syndrome but persistent symptoms Providers:                Remo Lipps P. Havery Moros, MD, Josie Dixon, RN,                            Elmer Ramp. Tilden Dome, RN, Lina Sar, Technician,                            Cherryland Armistead, CRNA Referring MD:              Medicines:                Monitored Anesthesia Care Complications:            No immediate complications. Estimated blood loss:                            Minimal. Estimated Blood Loss:     Estimated blood loss was minimal. Procedure:                Pre-Anesthesia Assessment:                           - Prior to the procedure, a History and Physical                            was performed, and patient medications and                            allergies were reviewed. The patient's tolerance of                            previous anesthesia was also reviewed. The risks                            and benefits of the procedure and the sedation                            options and risks were discussed with the patient.                            All questions were answered, and informed consent                            was obtained. Prior Anticoagulants: The patient has  taken Coumadin (warfarin), last dose was 5 days                            prior to procedure. ASA Grade Assessment: III - A                            patient with severe systemic disease. After                            reviewing the risks and benefits, the patient was                            deemed in satisfactory condition to undergo the   procedure.                           After obtaining informed consent, the colonoscope                            was passed under direct vision. Throughout the                            procedure, the patient's blood pressure, pulse, and                            oxygen saturations were monitored continuously. The                            PCF-H190DL ([id number]) Olympus pediatric colonscope                            was introduced through the anus and advanced to the                            the terminal ileum, with identification of the                            appendiceal orifice and IC valve. The colonoscopy                            was performed without difficulty. The patient                            tolerated the procedure well. The quality of the                            bowel preparation was good. The terminal ileum,                            ileocecal valve, appendiceal orifice, and rectum                            were photographed. Scope In: 10:29:07 AM Scope Out: 10:55:29 AM Scope Withdrawal Time: 0 hours 22 minutes 49 seconds  Total Procedure Duration: 0 hours 26 minutes 22 seconds  Findings:      The perianal and digital rectal examinations were normal.      The terminal ileum contained one small diverticulum.      The remainder of the exam in the terminal ileum was normal.      Two subtle angiodysplastic lesions were found in the cecum.      A 4 mm polyp was found in the hepatic flexure. The polyp was sessile.       The polyp was removed with a cold snare. Resection and retrieval were       complete.      Multiple medium-mouthed diverticula were found in the entire colon,       highest burden in the left colon.      Internal hemorrhoids were found during retroflexion. The hemorrhoids       were moderate, with one area with stigmata of prior banding.      The exam was otherwise without abnormality.      Biopsies for histology were taken with a cold forceps  from the right       colon, left colon and transverse colon for evaluation of microscopic       colitis. Impression:               - Ileal diverticulum.                           - Two colonic angiodysplastic lesions.                           - One 4 mm polyp at the hepatic flexure, removed                            with a cold snare. Resected and retrieved.                           - Diverticulosis in the entire examined colon.                           - Internal hemorrhoids.                           - The examination was otherwise normal. No overt                            inflammation                           - Biopsies were taken with a cold forceps from the                            right colon, left colon and transverse colon for                            evaluation of microscopic colitis. Moderate Sedation:      No moderate sedation, case performed with MAC Recommendation:           - Patient has a contact number available for  emergencies. The signs and symptoms of potential                            delayed complications were discussed with the                            patient. Return to normal activities tomorrow.                            Written discharge instructions were provided to the                            patient.                           - Resume previous diet.                           - Continue present medications.                           - Resume Coumadin tonight                           - Await pathology results. Procedure Code(s):        --- Professional ---                           [id number], Colonoscopy, flexible; with removal of                            tumor(s), polyp(s), or other lesion(s) by snare                            technique                           45380, 58, Colonoscopy, flexible; with biopsy,                            single or multiple Diagnosis Code(s):        --- Professional ---                            K64.8, Other hemorrhoids                           K63.5, Polyp of colon                           K57.50, Diverticulosis of both small and large                            intestine without perforation or abscess without                            bleeding CPT copyright 2019 American Medical Association. All rights reserved. The codes  documented in this report are preliminary and upon coder review may  be revised to meet current compliance requirements. Remo Lipps P. Armbruster, MD 11/04/2018 11:04:04 AM This report has been signed electronically. Number of Addenda: 0

## 2018-11-04 NOTE — Transfer of Care (Signed)
Immediate Anesthesia Transfer of Care Note  Patient: [name]  Procedure(s) Performed: COLONOSCOPY WITH PROPOFOL (N/A ) POLYPECTOMY BIOPSY  Patient Location: PACU and Endoscopy Unit  Anesthesia Type:MAC  Level of Consciousness: awake, alert , oriented and patient cooperative  Airway & Oxygen Therapy: Patient Spontanous Breathing and Patient connected to face mask oxygen  Post-op Assessment: Report given to RN, Post -op Vital signs reviewed and stable and Patient moving all extremities  Post vital signs: Reviewed and stable  Last Vitals:  Vitals Value Taken Time  BP    Temp    Pulse 63 11/04/18 1103  Resp 19 11/04/18 1103  SpO2 100 % 11/04/18 1103  Vitals shown include unvalidated device data.  Last Pain:  Vitals:   11/04/18 1008  TempSrc: Oral  PainSc: 0-No pain         Complications: No apparent anesthesia complications

## 2018-11-04 NOTE — Anesthesia Preprocedure Evaluation (Addendum)
Anesthesia Evaluation  Patient identified by MRN, date of birth, ID band Patient awake    Reviewed: Allergy & Precautions, NPO status , Patient's Chart, lab work & pertinent test results  History of Anesthesia Complications Negative for: history of anesthetic complications  Airway Mallampati: I  TM Distance: >3 FB Neck ROM: Full    Dental  (+) Teeth Intact   Pulmonary asthma ,    Pulmonary exam normal        Cardiovascular + CAD, + Past MI, + CABG and +CHF (EF 20-25%)  Normal cardiovascular exam+ dysrhythmias Atrial Fibrillation and Ventricular Tachycardia + Cardiac Defibrillator      Neuro/Psych negative neurological ROS  negative psych ROS   GI/Hepatic Neg liver ROS, PUD, GERD  ,  Endo/Other  negative endocrine ROS  Renal/GU negative Renal ROS  negative genitourinary   Musculoskeletal  (+) Arthritis ,   Abdominal   Peds  Hematology negative hematology ROS (+)   Anesthesia Other Findings Echo 04/04/17: Inferolateral and apical akinesis; overall severely reduced LV systolic function (EF 40-81%); restrictive filling; severe LVE; mild LVH; mild AI; moderate to severe MR; moderate LAE; mild TR; mild pulmonary hypertension (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)  Reproductive/Obstetrics                            Anesthesia Physical Anesthesia Plan  ASA: IV  Anesthesia Plan: MAC   Post-op Pain Management:    Induction: Intravenous  PONV Risk Score and Plan: 1 and Propofol infusion, TIVA and Treatment may vary due to age or medical condition  Airway Management Planned: Natural Airway, Nasal Cannula and Simple Face Mask  Additional Equipment: None  Intra-op Plan:   Post-operative Plan:   Informed Consent: I have reviewed the patients History and Physical, chart, labs and discussed the procedure including the risks, benefits and alternatives for the proposed anesthesia with the patient or authorized representative  who has indicated his/her understanding and acceptance.       Plan Discussed with:   Anesthesia Plan Comments:        Anesthesia Quick Evaluation

## 2018-11-04 NOTE — Discharge Instructions (Signed)

## 2018-11-04 NOTE — Anesthesia Postprocedure Evaluation (Signed)
Anesthesia Post Note  Patient: [name]  Procedure(s) Performed: COLONOSCOPY WITH PROPOFOL (N/A ) POLYPECTOMY BIOPSY     Patient location during evaluation: Endoscopy Anesthesia Type: MAC Level of consciousness: awake and alert Pain management: pain level controlled Vital Signs Assessment: post-procedure vital signs reviewed and stable Respiratory status: spontaneous breathing, nonlabored ventilation and respiratory function stable Cardiovascular status: blood pressure returned to baseline and stable Postop Assessment: no apparent nausea or vomiting Anesthetic complications: no    Last Vitals:  Vitals:   11/04/18 1110 11/04/18 1120  BP: (!) 93/45 (!) 100/48  Pulse: 60 65  Resp: 14 12  Temp:    SpO2: 94% 96%    Last Pain:  Vitals:   11/04/18 1120  TempSrc:   PainSc: 0-No pain                 Lidia Collum

## 2018-11-05 ENCOUNTER — Encounter (HOSPITAL_COMMUNITY): Payer: Self-pay | Admitting: Gastroenterology

## 2018-11-05 LAB — SURGICAL PATHOLOGY

## 2018-11-11 DIAGNOSIS — Z23 Encounter for immunization: Secondary | ICD-10-CM | POA: Diagnosis not present

## 2018-11-14 ENCOUNTER — Other Ambulatory Visit: Payer: Self-pay

## 2018-11-14 ENCOUNTER — Ambulatory Visit (INDEPENDENT_AMBULATORY_CARE_PROVIDER_SITE_OTHER): Payer: Medicare Other | Admitting: Pharmacist

## 2018-11-14 DIAGNOSIS — Z7901 Long term (current) use of anticoagulants: Secondary | ICD-10-CM | POA: Diagnosis not present

## 2018-11-14 DIAGNOSIS — I480 Paroxysmal atrial fibrillation: Secondary | ICD-10-CM

## 2018-11-14 DIAGNOSIS — Z5181 Encounter for therapeutic drug level monitoring: Secondary | ICD-10-CM

## 2018-11-14 DIAGNOSIS — I4891 Unspecified atrial fibrillation: Secondary | ICD-10-CM

## 2018-11-14 LAB — POCT INR: INR: 2 (ref 2.0–3.0)

## 2018-11-24 ENCOUNTER — Ambulatory Visit (INDEPENDENT_AMBULATORY_CARE_PROVIDER_SITE_OTHER): Payer: Medicare Other

## 2018-11-24 DIAGNOSIS — I509 Heart failure, unspecified: Secondary | ICD-10-CM | POA: Diagnosis not present

## 2018-11-24 DIAGNOSIS — Z95810 Presence of automatic (implantable) cardiac defibrillator: Secondary | ICD-10-CM | POA: Diagnosis not present

## 2018-11-25 NOTE — Progress Notes (Signed)
EPIC Encounter for ICM Monitoring  Patient Name: [name] is a 78 y.o. male Date: 11/25/2018 Primary Care Physican: [name], MD Primary Cardiologist:Kelly/Bensimhon Electrophysiologist: [name] BiV Pacing:97.4% 11/25/2018 Weight: 137 lbs   Spoke with patient. He is doing well and denies fluid symptoms.  Optivol thoracic impedance normal.  Prescribed:Torsemide 20 mg 1 tabletdaily. Potassium 10 mEq take 0.5 tablet daily.  Labs: 03/24/2018 Creatinine 1.00, BUN 15, Potassium 3.7, Sodium 136, GFR >60 A complete set of results can be found in Results Review  Recommendations: No changes and encouraged to call if experiencing any fluid symptoms.  Follow-up plan: ICM clinic phone appointment on 12/29/2018.   91 day device clinic remote transmission 01/22/2019.    Copy of ICM check sent to Dr. [name].   3 month ICM trend: 11/24/2018    1 Year ICM trend:       [name], RN 11/25/2018 12:25 PM

## 2018-11-28 ENCOUNTER — Other Ambulatory Visit: Payer: Self-pay | Admitting: Cardiovascular Disease

## 2018-12-02 ENCOUNTER — Other Ambulatory Visit (HOSPITAL_COMMUNITY): Payer: Self-pay

## 2018-12-02 MED ORDER — BISOPROLOL FUMARATE 5 MG PO TABS: ORAL_TABLET | ORAL | 1 refills | Status: DC

## 2018-12-10 ENCOUNTER — Ambulatory Visit (INDEPENDENT_AMBULATORY_CARE_PROVIDER_SITE_OTHER): Payer: Medicare Other | Admitting: Gastroenterology

## 2018-12-10 ENCOUNTER — Encounter: Payer: Self-pay | Admitting: Gastroenterology

## 2018-12-10 ENCOUNTER — Other Ambulatory Visit: Payer: Self-pay

## 2018-12-10 VITALS — BP 108/62 | HR 74 | Temp 97.1°F | Ht 67.0 in | Wt 141.2 lb

## 2018-12-10 DIAGNOSIS — K582 Mixed irritable bowel syndrome: Secondary | ICD-10-CM

## 2018-12-10 DIAGNOSIS — I255 Ischemic cardiomyopathy: Secondary | ICD-10-CM | POA: Diagnosis not present

## 2018-12-10 MED ORDER — NORTRIPTYLINE HCL 10 MG PO CAPS: 10.0000 mg | ORAL_CAPSULE | Freq: Every day | ORAL | 3 refills | Status: DC

## 2018-12-10 NOTE — Progress Notes (Signed)
HPI :  78 y/o male here for a follow up visit. He has a history of CAD, NSVT, history of CHF last EF 20-25%, on coumadin, rectal bleeding due to hemorrhoids, IBS, here for a follow up.  Since his last visit he had a colonoscopy, his first colonoscopy ever, on October 6.  He had a small polyp removed, diverticulosis, and hemorrhoids, as well as a few colonic angiodysplasias.  Polyp was an adenoma.  Random biopsies of the colon were normal.  He is also had a CT scan in February which did not show any bowel pathology.  His prostate was abnormal on the CT scan and I recommended him to see his urologist which he did.  His PSAs have been checked and he states his urologist did not recommend any further evaluation.  He has had longstanding irritable bowel syndrome for several years.  Main symptoms include spastic bowel with cramps, abdominal bloating and distention, occasional loose stools, sometimes rare constipation.  He has been tried on a variety of regimens to include Bentyl, peppermint supplement, IB gard, Rifaximin, low FODMAP diet - no help with any of these really  He says negative for celiac disease.  He has had hemorrhoids banded once in the past which provided significant benefit.  We have held off on further banding of his hemorrhoids as they are controlled and he is anticoagulated.  He is here today stating he feels about the same.  He continues to have spasms in his bowel that bother him.  His bowel habits have been fairly regular since the colonoscopy.  No weight loss.  He is eating well.  He continues to feel gassy and bloated.  Otherwise no new changes in status.  We discussed other options   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 - One 4 mm polyp at the hepatic flexure, removed with a cold snare. Resected and retrieved. - Diverticulosis in the entire examined colon. - Internal hemorrhoids. - The examination was otherwise normal. No overt  inflammation - Biopsies were taken with a cold forceps from the right colon, left colon and transverse colon for evaluation of microscopic colitis.  Path showed adenomatous polyp, and biopsies negative for microscopic colitis    Past Medical History:  Diagnosis Date  . Acute on chronic systolic CHF (congestive heart failure) ([place]) 06/09/2015  . 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 Cardiomyopathy, ischemic 2011   with EF 25-35% by echo  . Coronary artery disease    Hx MI 1992, CABG 1998 , Nuc study 11.2013 large scar but no ischemia  . GERD (gastroesophageal reflux disease)   . H. pylori infection    Hx of   . 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 NSVT (nonsustained ventricular tachycardia) ([place])   . Paroxysmal atrial fibrillation [facility])      Past Surgical History:  Procedure Laterality Date  . BIOPSY  11/04/2018   Procedure: BIOPSY;  Surgeon: Yetta Flock, MD;  Location: [facility];  Service: Gastroenterology;;  . Zollie Beckers N/A 09/10/2016   Procedure: BiV ICD Upgrade;  Surgeon: Deboraha Sprang, MD;  Location: [place] [facility];  Service: Cardiovascular;  Laterality: N/A;  . CATARACT EXTRACTION, BILATERAL    . COLONOSCOPY WITH PROPOFOL N/A 11/04/2018   Procedure: COLONOSCOPY WITH PROPOFOL;  Surgeon: Yetta Flock, MD;  Location: [facility];  Service: Gastroenterology;  Laterality: N/A;  .  CORONARY ARTERY BYPASS GRAFT  1998   x 5  . HERNIA REPAIR  1960's   right inguinal  . ICD GENERATOR CHANGE  01/2008, 2018   medtronic, hx+ EP study  . KNEE SURGERY  1960s   right, acl repair   . NASAL SINUS SURGERY  05/31/2010   Dr. Benjamine Mola  . POLYPECTOMY  11/04/2018   Procedure: POLYPECTOMY;  Surgeon: Yetta Flock, MD;  Location: [facility];  Service: Gastroenterology;;  . UPPER GI ENDOSCOPY     Family History  Problem Relation Age of Onset  . Heart disease Father         questionable  . Stroke Mother   . Heart failure Sister   . Healthy Brother   . Healthy Sister   . Parkinsonism Brother   . Colon cancer Neg Hx    Social History   Tobacco Use  . Smoking status: Never Smoker  . Smokeless tobacco: Never Used  Substance Use Topics  . Alcohol use: Yes    Alcohol/week: 2.0 standard drinks    Types: 2 Glasses of wine per week    Comment: socially  . Drug use: No   Current Outpatient Medications  Medication Sig Dispense Refill  . acetaminophen (TYLENOL) 650 MG CR tablet Take 650 mg by mouth 2 (two) times daily.     Marland Kitchen allopurinol (ZYLOPRIM) 300 MG tablet Take 300 mg by mouth daily.    . AMBULATORY NON FORMULARY MEDICATION Medication Name: Hydrocortisone cream 1%: Apply to the rectum once a day for a week, then as needed    . bisoprolol (ZEBETA) 5 MG tablet TAKE 1/2 (ONE-HALF) TABLET BY MOUTH ONCE DAILY 45 tablet 1  . cetirizine (ZYRTEC) 10 MG tablet Take 10 mg by mouth daily.      . Cholecalciferol (VITAMIN D) 2000 units CAPS Take 2,000-4,000 capsules by mouth daily.     Marland Kitchen dicyclomine (BENTYL) 10 MG capsule Take 1 capsule (10 mg total) by mouth 3 (three) times daily. 270 capsule 1  . hyoscyamine (LEVSIN SL) 0.125 MG SL tablet Place 1 tablet (0.125 mg total) under the tongue every 4 (four) hours as needed. 15 tablet 0  . isosorbide mononitrate (IMDUR) 30 MG 24 hr tablet Take 15 mg by mouth daily.    Marland Kitchen LORazepam (ATIVAN) 1 MG tablet Take 0.25-0.5 mg by mouth at bedtime.     Marland Kitchen losartan (COZAAR) 25 MG tablet TAKE 1 TABLET BY MOUTH TWICE A DAY (Patient taking differently: Take 25 mg by mouth 2 (two) times daily. ) 180 tablet 1  . magnesium oxide (MAG-OX) 400 MG tablet Take 400 mg by mouth daily at 12 noon.     . mometasone (ASMANEX) 220 MCG/INH inhaler Inhale 1 puff into the lungs daily.     . montelukast (SINGULAIR) 10 MG tablet Take 1 tablet (10 mg total) by mouth at bedtime. PLEASE SCHEDULE APPOINTMENT. (Patient taking differently: Take 10 mg by mouth at  bedtime. ) 30 tablet 0  . nitroGLYCERIN (NITROSTAT) 0.4 MG SL tablet Place 1 tablet (0.4 mg total) under the tongue every 5 (five) minutes as needed for chest pain. 25 tablet 1  . omeprazole (PRILOSEC) 20 MG capsule Take 1 capsule (20 mg total) by mouth every morning. 90 capsule 3  . Polyethyl Glycol-Propyl Glycol (SYSTANE OP) Apply 2-3 drops to eye 3 (three) times daily as needed (dry eyes).    . potassium chloride (K-DUR,KLOR-CON) 10 MEQ tablet Take 0.5 tablets (5 mEq total) by mouth daily. 45 tablet 3  .  potassium chloride (KLOR-CON) 10 MEQ tablet SMARTSIG:.5 Tablet(s) By Mouth Daily    . silodosin (RAPAFLO) 8 MG CAPS capsule Take 8 mg by mouth every other day.    . simvastatin (ZOCOR) 40 MG tablet TAKE 1 TABLET BY MOUTH AT BEDTIME 90 tablet 0  . spironolactone (ALDACTONE) 25 MG tablet Take 1 tablet (25 mg total) by mouth daily. 90 tablet 2  . tamsulosin (FLOMAX) 0.4 MG CAPS capsule Take 0.4 mg by mouth daily.    Marland Kitchen torsemide (DEMADEX) 20 MG tablet Take 1 tablet (20 mg total) by mouth daily. 90 tablet 3  . warfarin (COUMADIN) 5 MG tablet Take 1/2 to 1 tablet by mouth daily as directed (Patient taking differently: Take 2.5-5 mg by mouth See admin instructions. Take 2.5 mg on Mon. Wed. and Friday Take 5 mg on all the other days) 90 tablet 3  . AMBULATORY NON FORMULARY MEDICATION IBgard- Take as directed (Patient not taking: Reported on 10/30/2018) 16 capsule 0  . AMBULATORY NON FORMULARY MEDICATION Medication Name: FDgard - use as directed (Patient not taking: Reported on 10/30/2018) 12 capsule 0   No current facility-administered medications for this visit.    Allergies  Allergen Reactions  . Amoxicillin Rash    Has patient had a PCN reaction causing immediate rash, facial/tongue/throat swelling, SOB or lightheadedness with hypotension: Yes Has patient had a PCN reaction causing severe rash involving mucus membranes or skin necrosis: No Has patient had a PCN reaction that required  hospitalization: No Has patient had a PCN reaction occurring within the last 10 years: No If all of the above answers are "NO", then may proceed with Cephalosporin use.   Marland Kitchen Penicillins Rash    Has patient had a PCN reaction causing immediate rash, facial/tongue/throat swelling, SOB or lightheadedness with hypotension: Yes Has patient had a PCN reaction causing severe rash involving mucus membranes or skin necrosis: No Has patient had a PCN reaction that required hospitalization: No Has patient had a PCN reaction occurring within the last 10 years: No If all of the above answers are "NO", then may proceed with Cephalosporin use.      Review of Systems: All systems reviewed and negative except where noted in HPI.   Lab Results  Component Value Date   WBC 9.7 03/24/2018   HGB 14.4 03/24/2018   HCT 46.3 03/24/2018   MCV 91.7 03/24/2018   PLT 261 03/24/2018    Lab Results  Component Value Date   CREATININE 1.00 03/24/2018   BUN 15 03/24/2018   NA 136 03/24/2018   K 3.7 03/24/2018   CL 100 03/24/2018   CO2 28 03/24/2018    Lab Results  Component Value Date   ALT 16 03/24/2018   AST 24 03/24/2018   ALKPHOS 67 03/24/2018   BILITOT 1.1 03/24/2018     Physical Exam: BP 108/62   Pulse 74   Temp (!) 97.1 F (36.2 C)   Ht 5\' 7"  (1.702 m)   Wt 141 lb 3.2 oz (64 kg)   BMI 22.12 kg/m  Constitutional: Pleasant,well-developed, male in no acute distress. HEENT: Normocephalic and atraumatic. Conjunctivae are normal. No scleral icterus. Neck supple.  Cardiovascular: Normal rate, regular rhythm.  Pulmonary/chest: Effort normal and breath sounds normal. No wheezing, rales or rhonchi. Abdominal: Soft, nondistended, nontender. There are no masses palpable. No hepatomegaly. Extremities: no edema Lymphadenopathy: No cervical adenopathy noted. Neurological: Alert and oriented to person place and time. Skin: Skin is warm and dry. No rashes noted. Psychiatric: Normal  mood and  affect. Behavior is normal.   ASSESSMENT AND PLAN: 78 year old male here for reassessment of the following:  Irritable bowel syndrome / bloating - recent colonoscopy did not show any concerning pathology, biopsies negative.  History of negative celiac testing. Labs normal otherwise. CT scan shows no concerning pathology in the bowel.  He has been tried on multiple regimens for his symptoms and has not had much improved and remains frustrated about his symptoms.  We discussed what IBS is, I reassured him that I do not think were missing anything concerning at this point and management moving forward depends on how much this bothers him and how much risks he wants to take with medications and other interventions, etc.  Given the amount of bloating he is having I would like to have him try a gluten-free diet in case he has a gluten intolerance.  Otherwise we discussed medication options, I think a TCA may help prevent his bowel spasms.  We discussed options.  I would like to try him on a very low-dose of nortriptyline, 10 mg nightly for the next month and see how he does.  I counseled him this can take a few weeks to really kick in.  I think risks of side effects is pretty low at this dose.  If for some reason he does not feel well on it I asked him to contact me and stop it.  He agreed with the plan.  I asked him to touch base with me in 4 weeks for an update either way to let me know how he is doing.    [place] Cellar, MD [facility]

## 2018-12-10 NOTE — Patient Instructions (Addendum)
If you are age 79 or older, your body mass index should be between 23-30. Your Body mass index is 22.12 kg/m. If this is out of the aforementioned range listed, please consider follow up with your Primary Care Provider.  If you are age 53 or younger, your body mass index should be between 19-25. Your Body mass index is 22.12 kg/m. If this is out of the aformentioned range listed, please consider follow up with your Primary Care Provider.   To help prevent the possible spread of infection to our patients, communities, and staff; we will be implementing the following measures:  As of now we are not allowing any visitors/family members to accompany you to any upcoming appointments with [facility]. If you have any concerns about this please contact our office to discuss prior to the appointment.   We have sent the following medications to your pharmacy for you to pick up at your convenience: Nortriptyline (Pamelor) 10mg : take every night at bedtime  We are giving you a handout on Gluten-Free diet today.  Thank you for entrusting me with your care and for choosing [facility], Dr. Florala Cellar    Gluten-Free Diet for Celiac Disease, Adult  The gluten-free diet includes all foods that do not contain gluten. Gluten is a protein that is found in wheat, rye, barley, and some other grains. Following the gluten-free diet is the only treatment for people with celiac disease. It helps to prevent damage to the intestines and improves or eliminates the symptoms of celiac disease. Following the gluten-free diet requires some planning. It can be challenging at first, but it gets easier with time and practice. There are more gluten-free options available today than ever before. If you need help finding gluten-free foods or if you have questions, talk with your diet and nutrition specialist (registered dietitian) or your health care provider. What do I need to know about a gluten-free  diet?  All fruits, vegetables, and meats are safe to eat and do not contain gluten.  When grocery shopping, start by shopping in the produce, meat, and dairy sections. These sections are more likely to contain gluten-free foods. Then move to the aisles that contain packaged foods if you need to.  Read all food labels. Gluten is often added to foods. Always check the ingredient list and look for warnings, such as "may contain gluten."  Talk with your dietitian or health care provider before taking a gluten-free multivitamin or mineral supplement.  Be aware of gluten-free foods having contact with foods that contain gluten (cross-contamination). This can happen at home and with any processed foods. ? Talk with your health care provider or dietitian about how to reduce the risk of cross-contamination in your home. ? If you have questions about how a food is processed, ask the manufacturer. What key words help to identify gluten? Foods that list any of these key words on the label usually contain gluten:  Wheat, flour, enriched flour, bromated flour, white flour, durum flour, graham flour, phosphated flour, self-rising flour, semolina, farina, barley (malt), rye, and oats.  Starch, dextrin, modified food starch, or cereal.  Thickening, fillers, or emulsifiers.  Malt flavoring, malt extract, or malt syrup.  Hydrolyzed vegetable protein. In the [place] packaged foods that are gluten-free are required to be labeled "GF." These foods should be easy to identify and are safe to eat. In the [place] food companies are also required to list common food allergens, including wheat, on their labels. Recommended foods Grains  Amaranth, bean flours, 100% buckwheat flour, corn, millet, nut flours or nut meals, GF oats, quinoa, rice, sorghum, teff, rice wafers, pure cornmeal tortillas, popcorn, and hot cereals made from cornmeal. Hominy, rice, wild rice. Some Asian rice noodles or bean noodles. Arrowroot  starch, corn bran, corn flour, corn germ, cornmeal, corn starch, potato flour, potato starch flour, and rice bran. Plain, brown, and sweet rice flours. Rice polish, soy flour, and tapioca starch. Vegetables  All plain fresh, frozen, and canned vegetables. Fruits  All plain fresh, frozen, canned, and dried fruits, and 100% fruit juices. Meats and other protein foods  All fresh beef, pork, poultry, fish, seafood, and eggs. Fish canned in water, oil, brine, or vegetable broth. Plain nuts and seeds, peanut butter. Some lunch meat and some frankfurters. Dried beans, dried peas, and lentils. Dairy  Fresh plain, dry, evaporated, or condensed milk. Cream, butter, sour cream, whipping cream, and most yogurts. Unprocessed cheese, most processed cheeses, some cottage cheese, some cream cheeses. Beverages  Coffee, tea, most herbal teas. Carbonated beverages and some root beers. Wine, sake, and distilled spirits, such as gin, vodka, and whiskey. Most hard ciders. Fats and oils  Butter, margarine, vegetable oil, hydrogenated butter, olive oil, shortening, lard, cream, and some mayonnaise. Some commercial salad dressings. Olives. Sweets and desserts  Sugar, honey, some syrups, molasses, jelly, and jam. Plain hard candy, marshmallows, and gumdrops. Pure cocoa powder. Plain chocolate. Custard and some pudding mixes. Gelatin desserts, sorbets, frozen ice pops, and sherbet. Cake, cookies, and other desserts prepared with allowed flours. Some commercial ice creams. Cornstarch, tapioca, and rice puddings. Seasoning and other foods  Some canned or frozen soups. Monosodium glutamate (MSG). Cider, rice, and wine vinegar. Baking soda and baking powder. Cream of tartar. Baking and nutritional yeast. Certain soy sauces made without wheat (ask your dietitian about specific brands that are allowed). Nuts, coconut, and chocolate. Salt, pepper, herbs, spices, flavoring extracts, imitation or artificial flavorings, natural  flavorings, and food colorings. Some medicines and supplements. Some lip glosses and other cosmetics. Rice syrups. The items listed may not be a complete list. Talk with your dietitian about what dietary choices are best for you. Foods to avoid Grains  Barley, bran, bulgur, couscous, cracked wheat, [place], farro, graham, malt, matzo, semolina, wheat germ, and all wheat and rye cereals including spelt and kamut. Cereals containing malt as a flavoring, such as rice cereal. Noodles, spaghetti, macaroni, most packaged rice mixes, and all mixes containing wheat, rye, barley, or triticale. Vegetables  Most creamed vegetables and most vegetables canned in sauces. Some commercially prepared vegetables and salads. Fruits  Thickened or prepared fruits and some pie fillings. Some fruit snacks and fruit roll-ups. Meats and other protein foods  Any meat or meat alternative containing wheat, rye, barley, or gluten stabilizers. These are often marinated or packaged meats and lunch meats. Bread-containing products, such as Swiss steak, croquettes, meatballs, and meatloaf. Most tuna canned in vegetable broth and [place] with hydrolyzed vegetable protein (HVP) injected as part of the basting. [place]. Imitation fish. Eggs in sauces made from ingredients to avoid. Dairy  Commercial chocolate milk drinks and malted milk. Some non-dairy creamers. Any cheese product containing ingredients to avoid. Beverages  Certain cereal beverages. Beer, ale, malted milk, and some root beers. Some hard ciders. Some instant flavored coffees. Some herbal teas made with barley or with barley malt added. Fats and oils  Some commercial salad dressings. Sour cream containing modified food starch. Sweets and desserts  Some toffees. Chocolate-coated nuts (may  be rolled in wheat flour) and some commercial candies and candy bars. Most cakes, cookies, donuts, pastries, and other baked goods. Some commercial ice cream. Ice cream cones.  Commercially prepared mixes for cakes, cookies, and other desserts. Bread pudding and other puddings thickened with flour. Products containing brown rice syrup made with barley malt enzyme. Desserts and sweets made with malt flavoring. Seasoning and other foods  Some curry powders, some dry seasoning mixes, some gravy extracts, some meat sauces, some ketchups, some prepared mustards, and horseradish. Certain soy sauces. Malt vinegar. Bouillon and bouillon cubes that contain HVP. Some chip dips, and some chewing gum. Yeast extract. Brewer's yeast. Caramel color. Some medicines and supplements. Some lip glosses and other cosmetics. The items listed may not be a complete list. Talk with your dietitian about what dietary choices are best for you. Summary  Gluten is a protein that is found in wheat, rye, barley, and some other grains. The gluten-free diet includes all foods that do not contain gluten.  If you need help finding gluten-free foods or if you have questions, talk with your diet and nutrition specialist (registered dietitian) or your health care provider.  Read all food labels. Gluten is often added to foods. Always check the ingredient list and look for warnings, such as "may contain gluten." This information is not intended to replace advice given to you by your health care provider. Make sure you discuss any questions you have with your health care provider. Document Released: 01/15/2005 Document Revised: 12/28/2016 Document Reviewed: 10/31/2015 Elsevier Patient Education  2020 [employer].

## 2018-12-15 DIAGNOSIS — K582 Mixed irritable bowel syndrome: Secondary | ICD-10-CM | POA: Diagnosis not present

## 2018-12-18 DIAGNOSIS — H353211 Exudative age-related macular degeneration, right eye, with active choroidal neovascularization: Secondary | ICD-10-CM | POA: Diagnosis not present

## 2018-12-18 DIAGNOSIS — Z961 Presence of intraocular lens: Secondary | ICD-10-CM | POA: Diagnosis not present

## 2018-12-18 DIAGNOSIS — H353132 Nonexudative age-related macular degeneration, bilateral, intermediate dry stage: Secondary | ICD-10-CM | POA: Diagnosis not present

## 2018-12-18 DIAGNOSIS — H469 Unspecified optic neuritis: Secondary | ICD-10-CM | POA: Diagnosis not present

## 2018-12-18 DIAGNOSIS — H43811 Vitreous degeneration, right eye: Secondary | ICD-10-CM | POA: Diagnosis not present

## 2018-12-22 DIAGNOSIS — M25561 Pain in right knee: Secondary | ICD-10-CM | POA: Diagnosis not present

## 2018-12-22 DIAGNOSIS — M1990 Unspecified osteoarthritis, unspecified site: Secondary | ICD-10-CM | POA: Diagnosis not present

## 2018-12-22 DIAGNOSIS — M1120 Other chondrocalcinosis, unspecified site: Secondary | ICD-10-CM | POA: Diagnosis not present

## 2018-12-22 DIAGNOSIS — Z79899 Other long term (current) drug therapy: Secondary | ICD-10-CM | POA: Diagnosis not present

## 2018-12-22 DIAGNOSIS — M109 Gout, unspecified: Secondary | ICD-10-CM | POA: Diagnosis not present

## 2018-12-22 DIAGNOSIS — I509 Heart failure, unspecified: Secondary | ICD-10-CM | POA: Diagnosis not present

## 2018-12-24 ENCOUNTER — Ambulatory Visit (INDEPENDENT_AMBULATORY_CARE_PROVIDER_SITE_OTHER): Payer: Medicare Other | Admitting: Pharmacist

## 2018-12-24 ENCOUNTER — Other Ambulatory Visit: Payer: Self-pay

## 2018-12-24 DIAGNOSIS — I4891 Unspecified atrial fibrillation: Secondary | ICD-10-CM

## 2018-12-24 DIAGNOSIS — I480 Paroxysmal atrial fibrillation: Secondary | ICD-10-CM | POA: Diagnosis not present

## 2018-12-24 DIAGNOSIS — Z5181 Encounter for therapeutic drug level monitoring: Secondary | ICD-10-CM

## 2018-12-24 DIAGNOSIS — Z7901 Long term (current) use of anticoagulants: Secondary | ICD-10-CM

## 2018-12-24 LAB — POCT INR: INR: 2.5 (ref 2.0–3.0)

## 2018-12-29 ENCOUNTER — Ambulatory Visit (INDEPENDENT_AMBULATORY_CARE_PROVIDER_SITE_OTHER): Payer: Medicare Other

## 2018-12-29 DIAGNOSIS — Z95810 Presence of automatic (implantable) cardiac defibrillator: Secondary | ICD-10-CM

## 2018-12-29 DIAGNOSIS — I509 Heart failure, unspecified: Secondary | ICD-10-CM | POA: Diagnosis not present

## 2018-12-30 NOTE — Progress Notes (Signed)
EPIC Encounter for ICM Monitoring  Patient Name: [name] is a 78 y.o. male Date: 12/30/2018 Primary Care Physican: [name], MD Primary Cardiologist:Kelly/Bensimhon Electrophysiologist: [name] BiV Pacing:96.6% 12/30/2018 Weight: 136 lbs   Spoke with patient. He is doing well and denies fluid symptoms.  Optivol thoracic impedance normal.  Prescribed:Torsemide 20 mg 1 tabletdaily. Potassium 10 mEq take 0.5 tablet daily.  Labs: 03/24/2018 Creatinine 1.00, BUN 15, Potassium 3.7, Sodium 136, GFR >60 A complete set of results can be found in Results Review  Recommendations: No changes and encouraged to call if experiencing any fluid symptoms.  Follow-up plan: ICM clinic phone appointment on 02/02/2019.   91 day device clinic remote transmission 01/22/2019.     Copy of ICM check sent to Dr. [name].   3 month ICM trend: 12/29/2018    1 Year ICM trend:       [name], RN 12/30/2018 4:37 PM

## 2018-12-31 ENCOUNTER — Other Ambulatory Visit (HOSPITAL_COMMUNITY): Payer: Self-pay

## 2018-12-31 MED ORDER — ISOSORBIDE MONONITRATE ER 30 MG PO TB24: 15.0000 mg | ORAL_TABLET | Freq: Every day | ORAL | 3 refills | Status: DC

## 2019-01-08 DIAGNOSIS — H353132 Nonexudative age-related macular degeneration, bilateral, intermediate dry stage: Secondary | ICD-10-CM | POA: Diagnosis not present

## 2019-01-08 DIAGNOSIS — H353211 Exudative age-related macular degeneration, right eye, with active choroidal neovascularization: Secondary | ICD-10-CM | POA: Diagnosis not present

## 2019-01-08 DIAGNOSIS — H43811 Vitreous degeneration, right eye: Secondary | ICD-10-CM | POA: Diagnosis not present

## 2019-01-22 ENCOUNTER — Ambulatory Visit (INDEPENDENT_AMBULATORY_CARE_PROVIDER_SITE_OTHER): Payer: Medicare Other | Admitting: *Deleted

## 2019-01-22 DIAGNOSIS — I5022 Chronic systolic (congestive) heart failure: Secondary | ICD-10-CM | POA: Diagnosis not present

## 2019-01-22 IMAGING — RF DG UGI W/ HIGH DENSITY W/KUB
12 of 16 series · 12 of 24 positions shown · non-contrast
Comparison: None.

CLINICAL DATA: Upper abdominal fullness and bloating with early
satiety for the past month.

EXAM:
UPPER GI SERIES WITH KUB
TECHNIQUE: After obtaining a scout radiograph a routine upper GI series was
performed using thin and high density barium.
FLUOROSCOPY TIME:  Fluoroscopy Time:  2 minutes, 12 seconds.
Radiation Exposure Index (if provided by the fluoroscopic device):
30.8 mGy
Number of Acquired Spot Images: 0

[Series 2: cp_standard · 0.51mm/px · 1 of 76 frames shown (1 of 12)]
[frame 16/76]
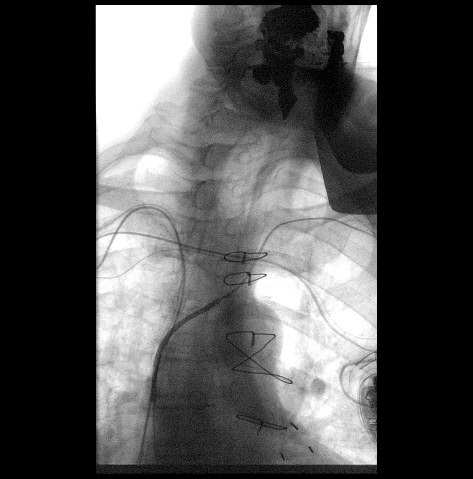

[Series 3: cp_standard · 0.34mm/px · 1 of 88 frames shown (2 of 12)]
[frame 25/88]
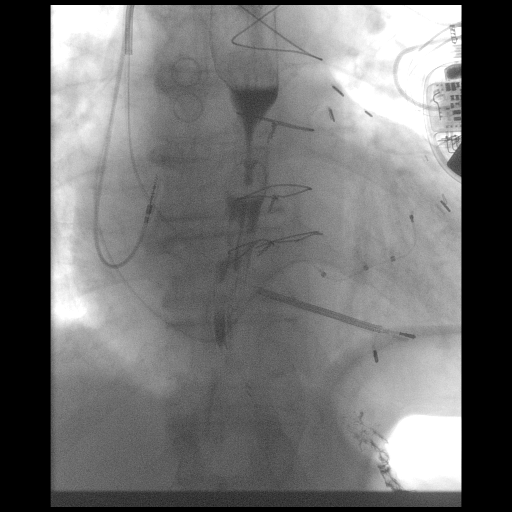

[Series 4: cp_standard · 0.34mm/px · 1 of 75 frames shown (3 of 12)]
[frame 12/75]
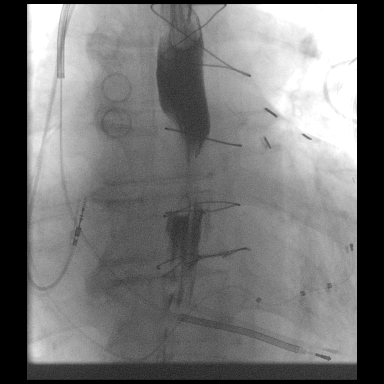

[Series 5: cp_standard · 0.17mm/px · 1 of 1 slices shown (4 of 12)]
[im 1/1]
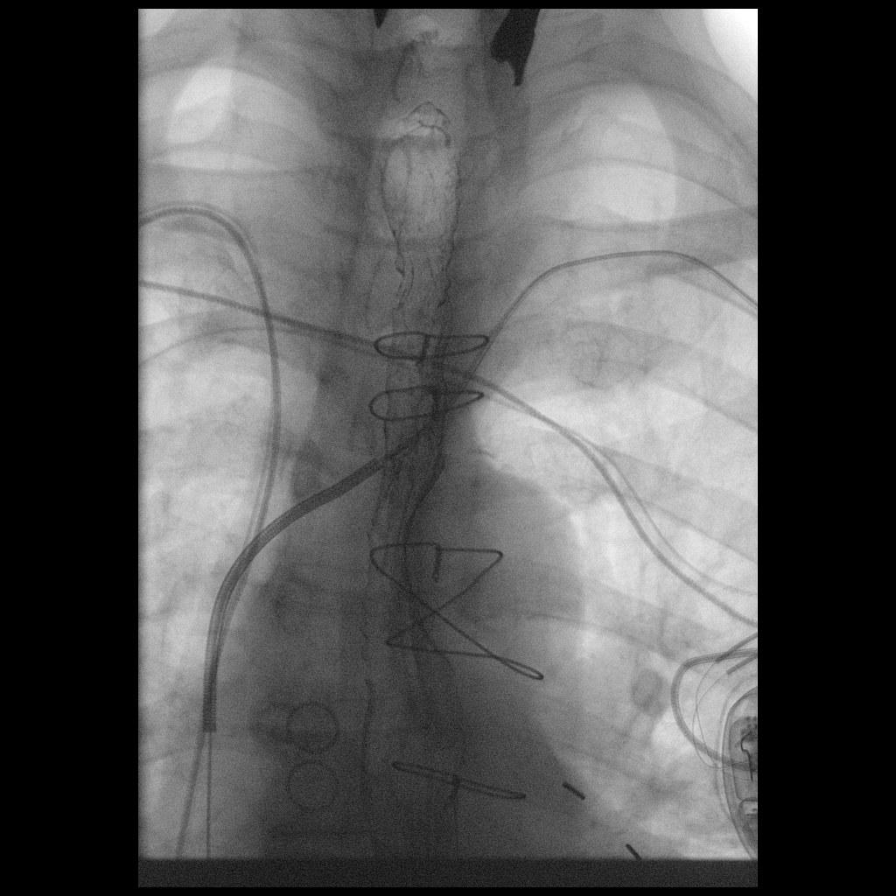

[Series 6: cp_standard · 0.34mm/px · 1 of 107 frames shown (5 of 12)]
[frame 91/107]
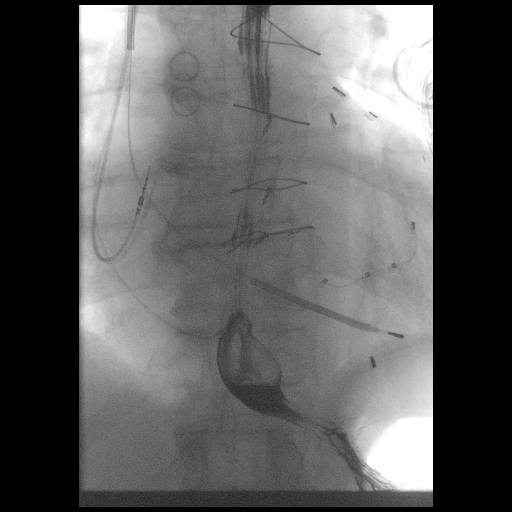

[Series 10: cp_standard · 0.17mm/px · 1 of 1 slices shown (6 of 12)]
[im 1/1]
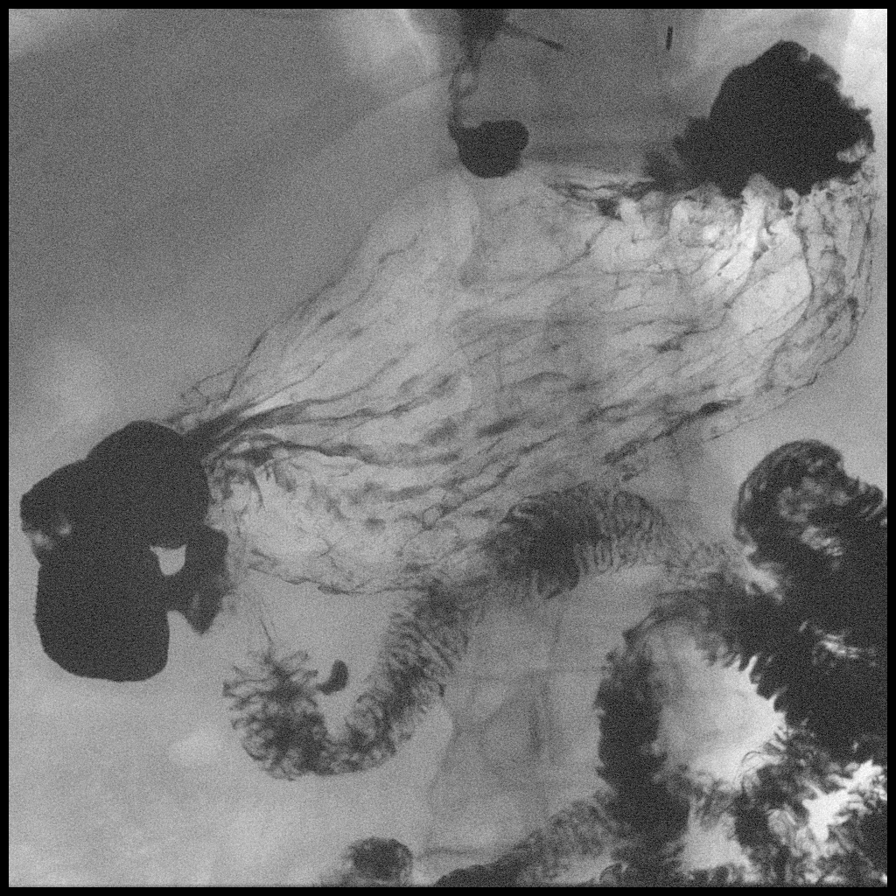

[Series 13: cp_standard · 0.17mm/px · 1 of 1 slices shown (7 of 12)]
[im 1/1]
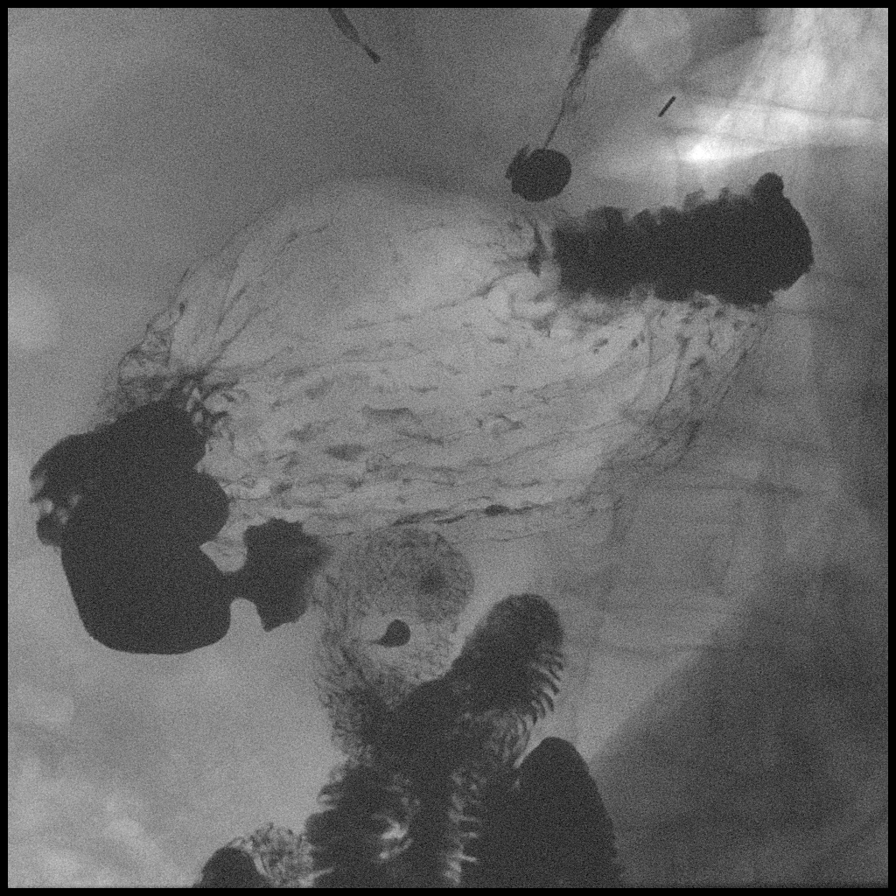

[Series 17: cp_standard · 0.25mm/px · 1 of 1 slices shown (8 of 12)]
[im 1/1]
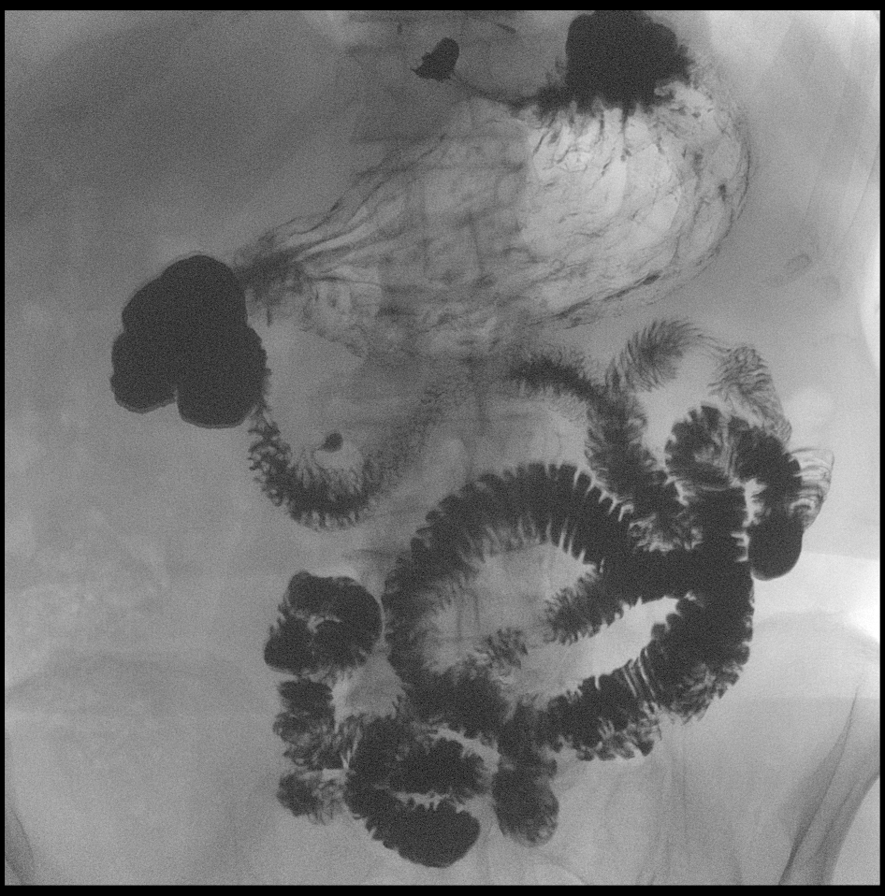

[Series 18: cp_standard · 0.51mm/px · 1 of 82 frames shown (9 of 12)]
[frame 79/82]
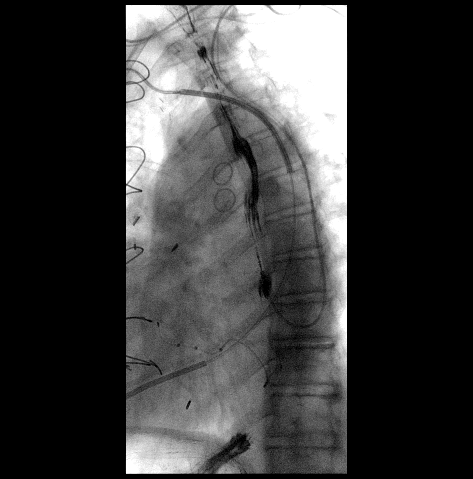

[Series 19: cp_standard · 0.51mm/px · 1 of 112 frames shown (10 of 12)]
[frame 96/112]
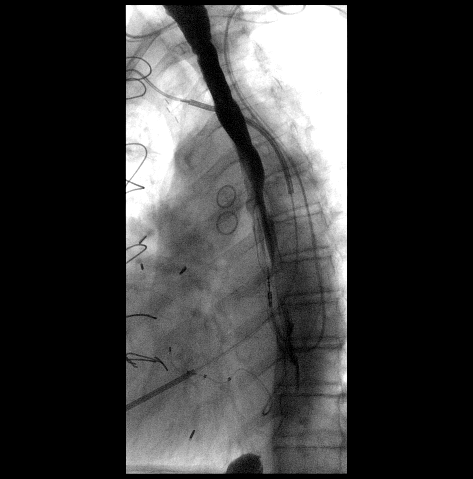

[Series 20: cp_standard · 0.51mm/px · 1 of 108 frames shown (11 of 12)]
[frame 92/108]
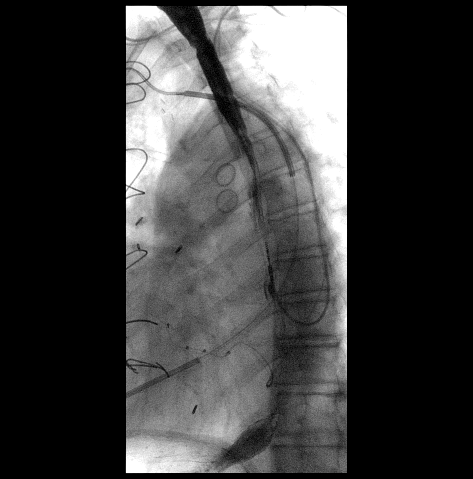

[Series 21: cp_standard · 0.51mm/px · 1 of 36 frames shown (12 of 12)]
[frame 31/36]
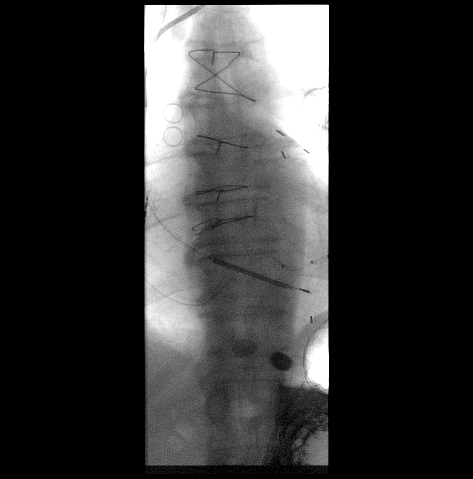

[12 of 24 positions shown; findings below may reference images not displayed]

FINDINGS: Preliminary KUB demonstrates a nonobstructive bowel gas pattern.
Something is

Primary peristaltic waves in the esophagus were essentially normal,
with only occasional very mild tertiary contractions. No esophageal
stricture, ulceration, or other significant abnormality. There is
mild spontaneous gastroesophageal reflux into the lower esophagus.
No hiatal hernia.

A 13 mm barium tablet passed without difficulty into the stomach.

Gastric morphology and appearance appear normal. There is a small
diverticula arising from the second portion of the duodenum.
Proximal duodenum otherwise appears normal.
IMPRESSION: 1. Minimal nonspecific esophageal dysmotility.
2. Mild spontaneous gastroesophageal reflux.  No hiatal hernia.
3. Small duodenal diverticulum.

## 2019-01-23 LAB — CUP PACEART REMOTE DEVICE CHECK
Battery Remaining Longevity: 63 mo
Battery Voltage: 2.97 V
Brady Statistic AP VP Percent: 85.42 %
Brady Statistic AP VS Percent: 1.72 %
Brady Statistic AS VP Percent: 12.39 %
Brady Statistic AS VS Percent: 0.48 %
Brady Statistic RA Percent Paced: 86.31 %
Brady Statistic RV Percent Paced: 0.41 %
Date Time Interrogation Session: 20201224012406
HighPow Impedance: 43 Ohm
HighPow Impedance: 76 Ohm
Implantable Lead Implant Date: 20010806
Implantable Lead Implant Date: 20180813
Implantable Lead Implant Date: 20180813
Implantable Lead Location: 753858
Implantable Lead Location: 753859
Implantable Lead Location: 753860
Implantable Lead Model: 147
Implantable Lead Model: 5092
Implantable Lead Serial Number: 104581
Implantable Pulse Generator Implant Date: 20180813
Lead Channel Impedance Value: 188.1 Ohm
Lead Channel Impedance Value: 188.1 Ohm
Lead Channel Impedance Value: 195.429
Lead Channel Impedance Value: 218.087
Lead Channel Impedance Value: 218.087
Lead Channel Impedance Value: 342 Ohm
Lead Channel Impedance Value: 418 Ohm
Lead Channel Impedance Value: 418 Ohm
Lead Channel Impedance Value: 456 Ohm
Lead Channel Impedance Value: 456 Ohm
Lead Channel Impedance Value: 646 Ohm
Lead Channel Impedance Value: 665 Ohm
Lead Channel Impedance Value: 665 Ohm
Lead Channel Impedance Value: 665 Ohm
Lead Channel Impedance Value: 665 Ohm
Lead Channel Impedance Value: 703 Ohm
Lead Channel Impedance Value: 703 Ohm
Lead Channel Impedance Value: 760 Ohm
Lead Channel Pacing Threshold Amplitude: 0.5 V
Lead Channel Pacing Threshold Amplitude: 1.375 V
Lead Channel Pacing Threshold Amplitude: 1.625 V
Lead Channel Pacing Threshold Pulse Width: 0.4 ms
Lead Channel Pacing Threshold Pulse Width: 0.4 ms
Lead Channel Pacing Threshold Pulse Width: 0.4 ms
Lead Channel Sensing Intrinsic Amplitude: 10.75 mV
Lead Channel Sensing Intrinsic Amplitude: 10.75 mV
Lead Channel Sensing Intrinsic Amplitude: 2 mV
Lead Channel Sensing Intrinsic Amplitude: 2 mV
Lead Channel Setting Pacing Amplitude: 1.75 V
Lead Channel Setting Pacing Amplitude: 2 V
Lead Channel Setting Pacing Amplitude: 3.25 V
Lead Channel Setting Pacing Pulse Width: 0.4 ms
Lead Channel Setting Pacing Pulse Width: 0.4 ms
Lead Channel Setting Sensing Sensitivity: 0.3 mV

## 2019-02-02 ENCOUNTER — Ambulatory Visit (INDEPENDENT_AMBULATORY_CARE_PROVIDER_SITE_OTHER): Payer: Medicare Other

## 2019-02-02 DIAGNOSIS — Z95810 Presence of automatic (implantable) cardiac defibrillator: Secondary | ICD-10-CM

## 2019-02-02 DIAGNOSIS — I509 Heart failure, unspecified: Secondary | ICD-10-CM

## 2019-02-04 ENCOUNTER — Other Ambulatory Visit: Payer: Self-pay

## 2019-02-04 ENCOUNTER — Ambulatory Visit (INDEPENDENT_AMBULATORY_CARE_PROVIDER_SITE_OTHER): Payer: Medicare Other | Admitting: Pharmacist

## 2019-02-04 DIAGNOSIS — I480 Paroxysmal atrial fibrillation: Secondary | ICD-10-CM | POA: Diagnosis not present

## 2019-02-04 DIAGNOSIS — Z7901 Long term (current) use of anticoagulants: Secondary | ICD-10-CM | POA: Diagnosis not present

## 2019-02-04 DIAGNOSIS — Z5181 Encounter for therapeutic drug level monitoring: Secondary | ICD-10-CM

## 2019-02-04 DIAGNOSIS — I4891 Unspecified atrial fibrillation: Secondary | ICD-10-CM

## 2019-02-04 LAB — POCT INR: INR: 3.1 — AB (ref 2.0–3.0)

## 2019-02-04 NOTE — Progress Notes (Signed)
EPIC Encounter for ICM Monitoring  Patient Name: [name] is a 79 y.o. male Date: 02/04/2019 Primary Care Physican: [name], MD Primary Cardiologist:Kelly/Bensimhon Electrophysiologist: [name] BiV Pacing:96.7% 1/6/2020Weight: 136lbs   Spoke with patient.He is doing well and denies fluid symptoms.  He reports eating a lot of take out food such as [place] and Asian type foods.  Advised restaurant foods contain very high amounts of slat and to eliminate restaurant foods if possible.  Optivol thoracic impedance suggesting possible fluid accumulation since 01/12/2019.  Prescribed:Torsemide 20 mg 1 tabletdaily. Potassium 10 mEq take 0.5 tablet daily.  Labs: 03/24/2018 Creatinine 1.00, BUN 15, Potassium 3.7, Sodium 136, GFR >60 A complete set of results can be found in Results Review  Recommendations:  Advised to take 40 mg Torsemide and 1 Potassium tablet tomorrow and Dr [name] will recheck remote transmission fluid levels at 02/11/2019 office visit.    Follow-up plan: ICM clinic phone appointment on 03/09/2019.   91 day device clinic remote transmission 04/23/2019.  Office appt 02/11/2019 with Dr. [name].    Copy of ICM check sent to Dr. [name] and Dr [name].   3 month ICM trend: 02/02/2019    1 Year ICM trend:       [name], RN 02/04/2019 2:21 PM

## 2019-02-05 DIAGNOSIS — J4540 Moderate persistent asthma, uncomplicated: Secondary | ICD-10-CM | POA: Diagnosis not present

## 2019-02-05 DIAGNOSIS — J3089 Other allergic rhinitis: Secondary | ICD-10-CM | POA: Diagnosis not present

## 2019-02-05 DIAGNOSIS — J3081 Allergic rhinitis due to animal (cat) (dog) hair and dander: Secondary | ICD-10-CM | POA: Diagnosis not present

## 2019-02-05 DIAGNOSIS — J301 Allergic rhinitis due to pollen: Secondary | ICD-10-CM | POA: Diagnosis not present

## 2019-02-09 DIAGNOSIS — H6121 Impacted cerumen, right ear: Secondary | ICD-10-CM | POA: Diagnosis not present

## 2019-02-09 DIAGNOSIS — J310 Chronic rhinitis: Secondary | ICD-10-CM | POA: Diagnosis not present

## 2019-02-09 DIAGNOSIS — H838X3 Other specified diseases of inner ear, bilateral: Secondary | ICD-10-CM | POA: Diagnosis not present

## 2019-02-09 DIAGNOSIS — H903 Sensorineural hearing loss, bilateral: Secondary | ICD-10-CM | POA: Diagnosis not present

## 2019-02-09 DIAGNOSIS — H6981 Other specified disorders of Eustachian tube, right ear: Secondary | ICD-10-CM | POA: Diagnosis not present

## 2019-02-11 ENCOUNTER — Encounter (HOSPITAL_COMMUNITY): Payer: Self-pay | Admitting: Internal Medicine

## 2019-02-11 ENCOUNTER — Other Ambulatory Visit: Payer: Self-pay

## 2019-02-11 ENCOUNTER — Telehealth (HOSPITAL_COMMUNITY): Payer: Self-pay | Admitting: Cardiology

## 2019-02-11 ENCOUNTER — Ambulatory Visit (HOSPITAL_COMMUNITY)
Admission: RE | Admit: 2019-02-11 | Discharge: 2019-02-11 | Disposition: A | Discharge status: Home / Self Care | Payer: Medicare Other | Source: Ambulatory Visit | Attending: Internal Medicine | Admitting: Internal Medicine

## 2019-02-11 VITALS — BP 103/58 | HR 67 | Wt 141.0 lb

## 2019-02-11 DIAGNOSIS — I5022 Chronic systolic (congestive) heart failure: Secondary | ICD-10-CM | POA: Diagnosis not present

## 2019-02-11 DIAGNOSIS — Z8249 Family history of ischemic heart disease and other diseases of the circulatory system: Secondary | ICD-10-CM | POA: Insufficient documentation

## 2019-02-11 DIAGNOSIS — Z951 Presence of aortocoronary bypass graft: Secondary | ICD-10-CM | POA: Diagnosis not present

## 2019-02-11 DIAGNOSIS — I447 Left bundle-branch block, unspecified: Secondary | ICD-10-CM | POA: Diagnosis not present

## 2019-02-11 DIAGNOSIS — Z7951 Long term (current) use of inhaled steroids: Secondary | ICD-10-CM | POA: Diagnosis not present

## 2019-02-11 DIAGNOSIS — I255 Ischemic cardiomyopathy: Secondary | ICD-10-CM | POA: Diagnosis not present

## 2019-02-11 DIAGNOSIS — I480 Paroxysmal atrial fibrillation: Secondary | ICD-10-CM | POA: Diagnosis not present

## 2019-02-11 DIAGNOSIS — K589 Irritable bowel syndrome without diarrhea: Secondary | ICD-10-CM | POA: Diagnosis not present

## 2019-02-11 DIAGNOSIS — M1000 Idiopathic gout, unspecified site: Secondary | ICD-10-CM

## 2019-02-11 DIAGNOSIS — Z95810 Presence of automatic (implantable) cardiac defibrillator: Secondary | ICD-10-CM | POA: Insufficient documentation

## 2019-02-11 DIAGNOSIS — I252 Old myocardial infarction: Secondary | ICD-10-CM | POA: Insufficient documentation

## 2019-02-11 DIAGNOSIS — I2510 Atherosclerotic heart disease of native coronary artery without angina pectoris: Secondary | ICD-10-CM | POA: Insufficient documentation

## 2019-02-11 DIAGNOSIS — K219 Gastro-esophageal reflux disease without esophagitis: Secondary | ICD-10-CM | POA: Insufficient documentation

## 2019-02-11 DIAGNOSIS — Z7901 Long term (current) use of anticoagulants: Secondary | ICD-10-CM | POA: Insufficient documentation

## 2019-02-11 DIAGNOSIS — Z79899 Other long term (current) drug therapy: Secondary | ICD-10-CM | POA: Insufficient documentation

## 2019-02-11 LAB — URIC ACID: Uric Acid, Serum: 3.6 mg/dL — ABNORMAL LOW (ref 3.7–8.6)

## 2019-02-11 LAB — CBC
HCT: 41.1 % (ref 39.0–52.0)
Hemoglobin: 13.2 g/dL (ref 13.0–17.0)
MCH: 29.1 pg (ref 26.0–34.0)
MCHC: 32.1 g/dL (ref 30.0–36.0)
MCV: 90.7 fL (ref 80.0–100.0)
Platelets: 348 10*3/uL (ref 150–400)
RBC: 4.53 MIL/uL (ref 4.22–5.81)
RDW: 14.4 % (ref 11.5–15.5)
WBC: 7.9 10*3/uL (ref 4.0–10.5)
nRBC: 0 % (ref 0.0–0.2)

## 2019-02-11 LAB — BASIC METABOLIC PANEL
Anion gap: 8 (ref 5–15)
BUN: 16 mg/dL (ref 8–23)
CO2: 27 mmol/L (ref 22–32)
Calcium: 9.4 mg/dL (ref 8.9–10.3)
Chloride: 99 mmol/L (ref 98–111)
Creatinine, Ser: 0.92 mg/dL (ref 0.61–1.24)
GFR calc Af Amer: >60 mL/min (ref >60)
GFR calc non Af Amer: >60 mL/min (ref >60)
Glucose, Bld: 92 mg/dL (ref 70–99)
Potassium: 4.6 mmol/L (ref 3.5–5.1)
Sodium: 134 mmol/L — ABNORMAL LOW (ref 135–145)

## 2019-02-11 LAB — BRAIN NATRIURETIC PEPTIDE: B Natriuretic Peptide: 1065.7 pg/mL — ABNORMAL HIGH (ref 0.0–100.0)

## 2019-02-11 NOTE — Telephone Encounter (Signed)
COVID-19 pre-appointment screening questions:   Do you have a history of COVID-19 or a positive test result in the past 7-10 days? NO  To the best of your knowledge, have you been in close contact with anyone with a confirmed diagnosis of COVID 19?  Have you had any one or more of the following: Fever, chills, cough, shortness of breath (out of the normal for you) or any flu-like symptoms? NO  Are you experiencing any of the following symptoms that is new or out of usual for you:  . Ear, nose or throat discomfort . Sore throat . Headache . Muscle Pain . Diarrhea . Loss of taste or smell  NO   Reviewed all the following with patient: . Use of hand sanitizer when entering the building . Everyone is required to wear a mask in the building, if you do not have a mask we are happy to provide you with one when you arrive . NO Visitor guidelines   If patient answers YES to any of questions they must change to a virtual visit and place note in comments about symptoms

## 2019-02-11 NOTE — Patient Instructions (Addendum)
Labs today  We will only contact you if something comes back abnormal or we need to make some changes. Otherwise no news is good news!    Your physician has requested that you have an echocardiogram. Echocardiography is a painless test that uses sound waves to create images of your heart. It provides your doctor with information about the size and shape of your heart and how well your heart's chambers and valves are working. This procedure takes approximately one hour. There are no restrictions for this procedure.   Your physician recommends that you schedule a follow-up appointment in: 3-4 months with Dr [name] with an ECHO   Please call office at [phone] option 2 if you have any questions or concerns.     At the [facility], you and your health needs are our priority. As part of our continuing mission to provide you with exceptional heart care, we have created designated Provider Care Teams. These Care Teams include your primary Cardiologist (physician) and Advanced Practice Providers (APPs- Physician Assistants and Nurse Practitioners) who all work together to provide you with the care you need, when you need it.   You may see any of the following providers on your designated Care Team at your next follow up: Marland Kitchen Dr Glori Bickers . Dr Loralie Champagne . Darrick Grinder, NP . Lyda Jester, PA . Audry Riles, PharmD   Please be sure to bring in all your medications bottles to every appointment.

## 2019-02-11 NOTE — Progress Notes (Signed)
Patient ID: [name], male   DOB: December 27, 1940, 79 y.o.   MRN: [id number]    Advanced Heart Failure Clinic Note   Date:  02/11/2019   ID:  Jason, [name] 1941/01/08, MRN [id number]  PCP:  [name], MD  Primary Cardiologist:  [name] Referring: Dr. [name]   History of Present Illness: [name] is a 79 y.o. 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 In September 1998 he underwent CABG revascularization surgery after an unsuccessful attempt at stenting of his proximal LAD by [name]. Ejection fraction was 25-30%. In 2002 he underwent initial ICD implantation for nonsustained ventricular tachycardia documented on event monitor for primary prevention. In January 2010 he underwent generator change with a Medtronic Virtuoso single chamber cardioverter defibrillator.  A nuclear perfusion study in November 2013 showed a large area of scar in the LAD territory (extent 44%) involving the mid to apical anterior,apical and infero-apical to mid infero-septal and apical lateral wall without associated ischemia.  He is on Coumadin anticoagulation for PAF. He was readmitted to the hospital from a May 2 through 06/04/2014 with recurrent atrial fibrillation and started on Tikosyn. He was enrolled in the genetic AF trial (bucindolol vs Toprol). He did poorly in the trial in the setting of titration of beta-blocker. He was referred to HF Clinic. Genetic AF study completed for pt. He is now on bisoprol 2.5 mg daily.   He has been discussing CRT upgrade with Dr. 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    He presents today for follow up. Overall feeling good. Continues to struggle with IBS. Walking 5-6K steps today. Several days ago did 14k. No edema, orthopnea or PND.   ICD  interrogated: No VT/AF. 100%  v-pacing. Fluid normal Personally reviewed   Echo 04/04/17 reviewed by Dr [name]. EF 20% with mild to mod reduction RV function.   CPX 02/17/16 Resting HR: 73 Peak HR: 108  (75% age predicted max HR) BP rest: 110/58 BP peak: 150/56 Peak VO2: 14.4 (58% predicted peak VO2) VE/VCO2 slope: 33 OUES: 1.32 Peak RER: 1.10 PETCO2 at peak: 33 O2pulse: 9  (82% predicted O2pulse)  Echo 12/17 EF 15-20%, Moderate MR, RV with reduced function Echo 5/17 EF 20% RV mildly down.  Echo 3/19: EF 20%, RV mild to moderate reduced function    Labs 10/04/15 K 5.3, Creatinine 0.85, BUN 15  Past Medical History:  Diagnosis Date  . Acute on chronic systolic CHF (congestive heart failure) ([place]) 06/09/2015  . 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 Cardiomyopathy, ischemic 2011   with EF 25-35% by echo  . Coronary artery disease    Hx MI 1992, CABG 1998 , Nuc study 11.2013 large scar but no ischemia  . GERD (gastroesophageal reflux disease)   . H. pylori infection    Hx of   . 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 NSVT (nonsustained ventricular tachycardia) ([place])   . Paroxysmal atrial fibrillation ([facility])     Current Outpatient Medications  Medication Sig Dispense Refill  . acetaminophen (TYLENOL) 650 MG CR tablet Take 650 mg by mouth 2 (two) times daily.     Marland Kitchen allopurinol (ZYLOPRIM) 300 MG tablet Take 300 mg by mouth daily.    Marland Kitchen  AMBULATORY NON FORMULARY MEDICATION Medication Name: Hydrocortisone cream 1%: Apply to the rectum once a day for a week, then as needed    . AMBULATORY NON FORMULARY MEDICATION IBgard- Take as directed 16 capsule 0  . AMBULATORY NON FORMULARY MEDICATION Medication Name: FDgard - use as directed 12 capsule 0  . bisoprolol (ZEBETA) 5 MG tablet TAKE 1/2 (ONE-HALF) TABLET BY MOUTH ONCE DAILY 45 tablet 1  . cetirizine (ZYRTEC) 10 MG tablet Take 10 mg by mouth daily.      . Cholecalciferol  (VITAMIN D) 2000 units CAPS Take 2,000-4,000 capsules by mouth daily.     Marland Kitchen dicyclomine (BENTYL) 10 MG capsule Take 1 capsule (10 mg total) by mouth 3 (three) times daily. 270 capsule 1  . hyoscyamine (LEVSIN SL) 0.125 MG SL tablet Place 1 tablet (0.125 mg total) under the tongue every 4 (four) hours as needed. 15 tablet 0  . isosorbide mononitrate (IMDUR) 30 MG 24 hr tablet Take 0.5 tablets (15 mg total) by mouth daily. 30 tablet 3  . LORazepam (ATIVAN) 1 MG tablet Take 0.25-0.5 mg by mouth at bedtime.     Marland Kitchen losartan (COZAAR) 25 MG tablet TAKE 1 TABLET BY MOUTH TWICE A DAY 180 tablet 1  . magnesium oxide (MAG-OX) 400 MG tablet Take 400 mg by mouth daily at 12 noon.     . mometasone (ASMANEX) 220 MCG/INH inhaler Inhale 1 puff into the lungs daily.     . montelukast (SINGULAIR) 10 MG tablet Take 1 tablet (10 mg total) by mouth at bedtime. PLEASE SCHEDULE APPOINTMENT. 30 tablet 0  . nitroGLYCERIN (NITROSTAT) 0.4 MG SL tablet Place 1 tablet (0.4 mg total) under the tongue every 5 (five) minutes as needed for chest pain. 25 tablet 1  . nortriptyline (PAMELOR) 10 MG capsule Take 1 capsule (10 mg total) by mouth at bedtime. 30 capsule 3  . omeprazole (PRILOSEC) 20 MG capsule Take 1 capsule (20 mg total) by mouth every morning. 90 capsule 3  . Polyethyl Glycol-Propyl Glycol (SYSTANE OP) Apply 2-3 drops to eye 3 (three) times daily as needed (dry eyes).    . potassium chloride (K-DUR,KLOR-CON) 10 MEQ tablet Take 0.5 tablets (5 mEq total) by mouth daily. 45 tablet 3  . potassium chloride (KLOR-CON) 10 MEQ tablet SMARTSIG:.5 Tablet(s) By Mouth Daily    . silodosin (RAPAFLO) 8 MG CAPS capsule Take 8 mg by mouth every other day.    . simvastatin (ZOCOR) 40 MG tablet TAKE 1 TABLET BY MOUTH AT BEDTIME 90 tablet 0  . spironolactone (ALDACTONE) 25 MG tablet Take 1 tablet (25 mg total) by mouth daily. 90 tablet 2  . tamsulosin (FLOMAX) 0.4 MG CAPS capsule Take 0.4 mg by mouth daily.    Marland Kitchen torsemide (DEMADEX) 20 MG  tablet Take 1 tablet (20 mg total) by mouth daily. 90 tablet 3  . warfarin (COUMADIN) 5 MG tablet Take 1/2 to 1 tablet by mouth daily as directed 90 tablet 3   No current facility-administered medications for this encounter.    Allergies:    Allergies  Allergen Reactions  . Amoxicillin Rash    Has patient had a PCN reaction causing immediate rash, facial/tongue/throat swelling, SOB or lightheadedness with hypotension: Yes Has patient had a PCN reaction causing severe rash involving mucus membranes or skin necrosis: No Has patient had a PCN reaction that required hospitalization: No Has patient had a PCN reaction occurring within the last 10 years: No If all of the above answers are "NO", then  may proceed with Cephalosporin use.   Marland Kitchen Penicillins Rash    Has patient had a PCN reaction causing immediate rash, facial/tongue/throat swelling, SOB or lightheadedness with hypotension: Yes Has patient had a PCN reaction causing severe rash involving mucus membranes or skin necrosis: No Has patient had a PCN reaction that required hospitalization: No Has patient had a PCN reaction occurring within the last 10 years: No If all of the above answers are "NO", then may proceed with Cephalosporin use.     Social History:  The patient  reports that he has never smoked. He has never used smokeless tobacco. He reports current alcohol use of about 2.0 standard drinks of alcohol per week. He reports that he does not use drugs.   Family history:   Family History  Problem Relation Age of Onset  . Heart disease Father        questionable  . Stroke Mother   . Heart failure Sister   . Healthy Brother   . Healthy Sister   . Parkinsonism Brother   . Colon cancer Neg Hx    Review of systems complete and found to be negative unless listed in HPI.    PHYSICAL EXAM: Vitals:   02/11/19 1341  BP: (!) 103/58  Pulse: 67  SpO2: 98%  Weight: 64 kg (141 lb)   Wt Readings from Last 3 Encounters:  02/11/19  64 kg (141 lb)  12/10/18 64 kg (141 lb 3.2 oz)  11/04/18 62.1 kg (137 lb)   General:  Well appearing. No resp difficulty HEENT: normal Neck: supple. JVP 9-10 prominent CV waves. Carotids 2+ bilat; no bruits. No lymphadenopathy or thryomegaly appreciated. Cor: PMI nondisplaced. Regular rate & rhythm. 2/6 TR/MR Lungs: clear Abdomen: soft, nontender, nondistended. No hepatosplenomegaly. No bruits or masses. Good bowel sounds. Extremities: no cyanosis, clubbing, rash, edema Neuro: alert & orientedx3, cranial nerves grossly intact. moves all 4 extremities w/o difficulty. Affect pleasant  ICD interrogated personally. No VT/AF. Volume up since mid November. Now above threshold. Personally reviewed   ASSESSMENT AND PLAN:  1. 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  - Volume status elevated on exam and ICD interrogation  - Rash after taking Entresto.  - Continue losartan 25 bid - Continue torsemide at 20 daily - will double for 3 days with volume overload. Asked him to watch salt intake.  - Continue spiro 25 mg daily - Continue bisoprolol 2.5 mg daily. Unable to tolerate higher.  - Labs today   2. PAF - Maintaining NSR on exam and ICD - Continue coumadin. Does not want Eliquis due to cost. - No bleeding.    3. CAD s/p previous anterior infarct - No s/s of ischemia - Off ASA due to warfarin. Continue statin. - Recent LDL 76 in 1/19  4. LBBB - Now s/p CRT. No change.   5. IBS - following with GI  Glori Bickers, MD  2:03 PM

## 2019-02-17 DIAGNOSIS — H9311 Tinnitus, right ear: Secondary | ICD-10-CM | POA: Diagnosis not present

## 2019-02-20 DIAGNOSIS — F419 Anxiety disorder, unspecified: Secondary | ICD-10-CM | POA: Diagnosis not present

## 2019-02-20 DIAGNOSIS — Z713 Dietary counseling and surveillance: Secondary | ICD-10-CM | POA: Diagnosis not present

## 2019-02-20 DIAGNOSIS — R062 Wheezing: Secondary | ICD-10-CM | POA: Diagnosis not present

## 2019-02-25 ENCOUNTER — Other Ambulatory Visit (HOSPITAL_COMMUNITY): Payer: Self-pay | Admitting: Cardiology

## 2019-02-27 ENCOUNTER — Ambulatory Visit: Payer: Medicare Other

## 2019-03-03 ENCOUNTER — Other Ambulatory Visit (HOSPITAL_COMMUNITY): Payer: Self-pay | Admitting: Internal Medicine

## 2019-03-04 ENCOUNTER — Encounter: Payer: Self-pay | Admitting: Pharmacist Clinician (PhC)/ Clinical Pharmacy Specialist

## 2019-03-04 ENCOUNTER — Ambulatory Visit (INDEPENDENT_AMBULATORY_CARE_PROVIDER_SITE_OTHER): Payer: Medicare Other | Admitting: Pharmacist Clinician (PhC)/ Clinical Pharmacy Specialist

## 2019-03-04 ENCOUNTER — Other Ambulatory Visit: Payer: Self-pay

## 2019-03-04 DIAGNOSIS — I4891 Unspecified atrial fibrillation: Secondary | ICD-10-CM

## 2019-03-04 DIAGNOSIS — Z5181 Encounter for therapeutic drug level monitoring: Secondary | ICD-10-CM

## 2019-03-04 DIAGNOSIS — I480 Paroxysmal atrial fibrillation: Secondary | ICD-10-CM | POA: Diagnosis not present

## 2019-03-04 DIAGNOSIS — Z7901 Long term (current) use of anticoagulants: Secondary | ICD-10-CM | POA: Diagnosis not present

## 2019-03-04 LAB — POCT INR: INR: 3.1 — AB (ref 2.0–3.0)

## 2019-03-04 NOTE — Patient Instructions (Signed)
Continue with 1 tablet daily except 1/2 tablet each Mondays, Wednesdays and Fridays. Recheck INR in 4 weeks. *Call office at [phone] if changes in medication or health*

## 2019-03-07 ENCOUNTER — Ambulatory Visit: Payer: Medicare Other | Attending: Internal Medicine

## 2019-03-07 ENCOUNTER — Other Ambulatory Visit (HOSPITAL_COMMUNITY): Payer: Self-pay | Admitting: Cardiology

## 2019-03-07 ENCOUNTER — Other Ambulatory Visit: Payer: Self-pay

## 2019-03-07 DIAGNOSIS — Z23 Encounter for immunization: Secondary | ICD-10-CM

## 2019-03-07 NOTE — Progress Notes (Signed)
   Covid-19 Vaccination Clinic  Name:  [name]    MRN: [id number] DOB: 06-26-1940  03/07/2019  Mr. [name] was observed post Covid-19 immunization for 15 minutes without incidence. He was provided with Vaccine Information Sheet and instruction to access the V-Safe system.   Mr. [name] was instructed to call 911 with any severe reactions post vaccine: Marland Kitchen Difficulty breathing  . Swelling of your face and throat  . A fast heartbeat  . A bad rash all over your body  . Dizziness and weakness    Immunizations Administered    Name Date Dose VIS Date Route   Pfizer COVID-19 Vaccine 03/07/2019  2:05 PM 0.3 mL 01/09/2019 Intramuscular   Manufacturer: [place]   Lot: [id number]   [place]: [id number]

## 2019-03-09 ENCOUNTER — Ambulatory Visit (INDEPENDENT_AMBULATORY_CARE_PROVIDER_SITE_OTHER): Payer: Medicare Other

## 2019-03-09 DIAGNOSIS — Z95810 Presence of automatic (implantable) cardiac defibrillator: Secondary | ICD-10-CM

## 2019-03-09 DIAGNOSIS — I5022 Chronic systolic (congestive) heart failure: Secondary | ICD-10-CM

## 2019-03-10 DIAGNOSIS — R972 Elevated prostate specific antigen [PSA]: Secondary | ICD-10-CM | POA: Diagnosis not present

## 2019-03-10 NOTE — Progress Notes (Signed)
EPIC Encounter for ICM Monitoring  Patient Name: [name] is a 79 y.o. male Date: 03/10/2019 Primary Care Physican: [name], MD Primary Cardiologist:Kelly/Bensimhon Electrophysiologist: [name] BiV Pacing:97.6% 2/9/2021Weight: [id number]   Spoke with patient and he denies any fluid symptoms. Patient does not routinely adhere to low salt diet.  Optivol thoracic impedance suggesting possible fluid accumulation since 02/18/2019.  Prescribed:Torsemide 20 mg 1 tabletdaily. Potassium 10 mEq take 0.5 tablet daily.  Labs: 02/11/2019 Creatinine 0.92, BUN 16, Potassium 4.6, Sodium 134, GFR >60 03/24/2018 Creatinine 1.00, BUN 15, Potassium 3.7, Sodium 136, GFR >60 A complete set of results can be found in Results Review  Recommendations: Advised to take 40 mg Torsemide and 1 Potassium tablet x 1 day and then return to prescribed dosage.  Follow-up plan: ICM clinic phone appointment on 03/13/2019 to recheck fluid levels.  91 day device clinic remote transmission 04/23/2019.  Office appt 05/18/2019 with Dr. [name].    Copy of ICM check sent to Dr. [name] and Dr [name].   3 month ICM trend: 03/09/2019    1 Year ICM trend:       [name], RN 03/10/2019 9:54 AM

## 2019-03-11 DIAGNOSIS — H9311 Tinnitus, right ear: Secondary | ICD-10-CM | POA: Diagnosis not present

## 2019-03-13 ENCOUNTER — Ambulatory Visit (INDEPENDENT_AMBULATORY_CARE_PROVIDER_SITE_OTHER): Payer: Medicare Other

## 2019-03-13 DIAGNOSIS — I5022 Chronic systolic (congestive) heart failure: Secondary | ICD-10-CM

## 2019-03-13 DIAGNOSIS — Z95810 Presence of automatic (implantable) cardiac defibrillator: Secondary | ICD-10-CM

## 2019-03-13 NOTE — Progress Notes (Signed)
EPIC Encounter for ICM Monitoring  Patient Name: [name] is a 79 y.o. male Date: 03/13/2019 Primary Care Physican: [name], MD Primary Cardiologist:Kelly/Bensimhon Electrophysiologist: [name] BiV Pacing:97.2% 2/9/2021Weight: [id number]   Spoke with patient and he reports shortness of breath at times.  Advised to limit salt intake to 2000 mg daily and avoid restaurant foods.  Optivol thoracic impedancesuggesting possible fluid accumulation since 02/18/2019.  Prescribed:Torsemide 20 mg 1 tabletdaily. Potassium 10 mEq take 0.5 tablet daily.  Labs: 02/11/2019 Creatinine 0.92, BUN 16, Potassium 4.6, Sodium 134, GFR >60 03/24/2018 Creatinine 1.00, BUN 15, Potassium 3.7, Sodium 136, GFR >60 A complete set of results can be found in Results Review  Recommendations: Advised to take 40 mg Torsemide and 1 Potassium tablet x 3 days and then return to prescribed dosage.  Follow-up plan: ICM clinic phone appointment on2/16/2021 (manual send) to recheck fluid levels. 91 day device clinic remote transmission 04/23/2019. Office appt 05/18/2019 with Dr.Bensimhon.   Copy of ICM check sent to Dr.Klein and Dr [name].   3 month ICM trend: 03/13/2019    1 Year ICM trend:       [name], RN 03/13/2019 1:28 PM

## 2019-03-17 ENCOUNTER — Ambulatory Visit (INDEPENDENT_AMBULATORY_CARE_PROVIDER_SITE_OTHER): Payer: Medicare Other

## 2019-03-17 ENCOUNTER — Telehealth: Payer: Self-pay

## 2019-03-17 DIAGNOSIS — Z95810 Presence of automatic (implantable) cardiac defibrillator: Secondary | ICD-10-CM

## 2019-03-17 DIAGNOSIS — R972 Elevated prostate specific antigen [PSA]: Secondary | ICD-10-CM | POA: Diagnosis not present

## 2019-03-17 DIAGNOSIS — I5022 Chronic systolic (congestive) heart failure: Secondary | ICD-10-CM

## 2019-03-17 DIAGNOSIS — N402 Nodular prostate without lower urinary tract symptoms: Secondary | ICD-10-CM | POA: Diagnosis not present

## 2019-03-17 NOTE — Telephone Encounter (Signed)
Left message for patient to remind of missed remote transmission.  

## 2019-03-17 NOTE — Progress Notes (Signed)
EPIC Encounter for ICM Monitoring  Patient Name: [name] is a 79 y.o. male Date: 03/17/2019 Primary Care Physican: [name], MD Primary Cardiologist:Kelly/Bensimhon Electrophysiologist: [name] BiV Pacing:97.2% 2/16/2021Weight: 134-135lbs   Spoke with patient and he is feeling fine at this time.    Optivol thoracic impedancereturned normal after taking 3 days of extra Torsemide.  Prescribed:Torsemide 20 mg 1 tabletdaily. Potassium 10 mEq take 0.5 tablet daily.  Labs: 01/13/2021Creatinine0.92, BUN 16, Potassium4.6, Sodium 134, GFR >60 03/24/2018 Creatinine 1.00, BUN 15, Potassium 3.7, Sodium 136, GFR >60 A complete set of results can be found in Results Review  Recommendations: No changes and encouraged to call if experiencing any fluid symptoms.  Follow-up plan: ICM clinic phone appointment on3/26/2021. 91 day device clinic remote transmission 04/23/2019. Office appt4/19/2021 with [place].   Copy of ICM check sent to [place].  3 month ICM trend: 03/17/2019    1 Year ICM trend:       [name], RN 03/17/2019 4:53 PM

## 2019-03-18 ENCOUNTER — Other Ambulatory Visit: Payer: Self-pay | Admitting: Cardiovascular Disease

## 2019-03-18 NOTE — Telephone Encounter (Signed)
New Message    *STAT* If patient is at the pharmacy, call can be transferred to refill team.   1. Which medications need to be refilled? (please list name of each medication and dose if known) simvastatin (ZOCOR) 40 MG tablet    2. Which pharmacy/location (including street and city if local pharmacy) is medication to be sent to? [facility], [address], [place] [address]   3. Do they need a 30 day or 90 day supply? 90 day

## 2019-03-19 ENCOUNTER — Other Ambulatory Visit: Payer: Self-pay | Admitting: Cardiovascular Disease

## 2019-03-19 MED ORDER — SIMVASTATIN 40 MG PO TABS: 40.0000 mg | ORAL_TABLET | Freq: Every day | ORAL | 0 refills | Status: DC

## 2019-03-19 NOTE — Telephone Encounter (Signed)
Rx sent in today; pt needs an appointment for further refills.

## 2019-03-19 NOTE — Telephone Encounter (Signed)
New Message     *STAT* If patient is at the pharmacy, call can be transferred to refill team.   1. Which medications need to be refilled? (please list name of each medication and dose if known) simvastatin (ZOCOR) 40 MG tablet  2. Which pharmacy/location (including street and city if local pharmacy) is medication to be sent to? [facility] [address] [place], [place].  3. Do they need a 30 day or 90 day supply? 30 or 90

## 2019-03-20 ENCOUNTER — Telehealth (HOSPITAL_COMMUNITY): Payer: Self-pay | Admitting: Pharmacist

## 2019-03-20 MED ORDER — TORSEMIDE 20 MG PO TABS: 20.0000 mg | ORAL_TABLET | Freq: Every day | ORAL | 3 refills | Status: DC

## 2019-03-20 NOTE — Telephone Encounter (Signed)
Refill for torsemide sent to [facility].  Audry Riles, PharmD, BCPS, BCCP, CPP Heart Failure Clinic Pharmacist [phone]

## 2019-03-23 ENCOUNTER — Telehealth: Payer: Self-pay

## 2019-03-23 NOTE — Telephone Encounter (Signed)
[facility] Torsemide- it is not currently on med list, please advise on if okay to refill?   Thank you!

## 2019-03-24 NOTE — Telephone Encounter (Signed)
OK to renew 

## 2019-03-26 ENCOUNTER — Other Ambulatory Visit: Payer: Self-pay

## 2019-03-26 MED ORDER — TORSEMIDE 20 MG PO TABS: 20.0000 mg | ORAL_TABLET | Freq: Every day | ORAL | 3 refills | Status: DC

## 2019-03-26 NOTE — Telephone Encounter (Signed)
[facility] See other encounter

## 2019-03-26 NOTE — Telephone Encounter (Signed)
Refill on Torsemide 20mg  sent to [facility] [address] - [place], [place].  [address] [place] [address]

## 2019-03-27 DIAGNOSIS — R062 Wheezing: Secondary | ICD-10-CM | POA: Diagnosis not present

## 2019-03-27 DIAGNOSIS — K589 Irritable bowel syndrome without diarrhea: Secondary | ICD-10-CM | POA: Diagnosis not present

## 2019-04-01 ENCOUNTER — Ambulatory Visit: Payer: Medicare Other | Attending: Internal Medicine

## 2019-04-01 DIAGNOSIS — Z23 Encounter for immunization: Secondary | ICD-10-CM | POA: Insufficient documentation

## 2019-04-01 NOTE — Progress Notes (Signed)
   Covid-19 Vaccination Clinic  Name:  [name]    MRN: [id number] DOB: December 24, 1940  04/01/2019  [name] was observed post Covid-19 immunization for 15 minutes without incident. He was provided with Vaccine Information Sheet and instruction to access the V-Safe system.   [name] was instructed to call 911 with any severe reactions post vaccine: Marland Kitchen Difficulty breathing  . Swelling of face and throat  . A fast heartbeat  . A bad rash all over body  . Dizziness and weakness   Immunizations Administered    Name Date Dose VIS Date Route   Pfizer COVID-19 Vaccine 04/01/2019 10:52 AM 0.3 mL 01/09/2019 Intramuscular   Manufacturer: [place]   Lot: [id number]   [place]: [id number]

## 2019-04-02 DIAGNOSIS — H353211 Exudative age-related macular degeneration, right eye, with active choroidal neovascularization: Secondary | ICD-10-CM | POA: Diagnosis not present

## 2019-04-02 DIAGNOSIS — H353122 Nonexudative age-related macular degeneration, left eye, intermediate dry stage: Secondary | ICD-10-CM | POA: Diagnosis not present

## 2019-04-02 DIAGNOSIS — Z961 Presence of intraocular lens: Secondary | ICD-10-CM | POA: Diagnosis not present

## 2019-04-02 DIAGNOSIS — H469 Unspecified optic neuritis: Secondary | ICD-10-CM | POA: Diagnosis not present

## 2019-04-03 ENCOUNTER — Ambulatory Visit (INDEPENDENT_AMBULATORY_CARE_PROVIDER_SITE_OTHER): Payer: Medicare Other | Admitting: Pharmacist Clinician (PhC)/ Clinical Pharmacy Specialist

## 2019-04-03 ENCOUNTER — Other Ambulatory Visit: Payer: Self-pay

## 2019-04-03 ENCOUNTER — Ambulatory Visit: Payer: Medicare Other

## 2019-04-03 DIAGNOSIS — Z7901 Long term (current) use of anticoagulants: Secondary | ICD-10-CM

## 2019-04-03 DIAGNOSIS — I4891 Unspecified atrial fibrillation: Secondary | ICD-10-CM | POA: Diagnosis not present

## 2019-04-03 DIAGNOSIS — I480 Paroxysmal atrial fibrillation: Secondary | ICD-10-CM | POA: Diagnosis not present

## 2019-04-03 DIAGNOSIS — Z5181 Encounter for therapeutic drug level monitoring: Secondary | ICD-10-CM

## 2019-04-03 LAB — POCT INR: INR: 2.7 (ref 2.0–3.0)

## 2019-04-06 ENCOUNTER — Other Ambulatory Visit: Payer: Self-pay | Admitting: Cardiovascular Disease

## 2019-04-06 ENCOUNTER — Other Ambulatory Visit (HOSPITAL_COMMUNITY): Payer: Self-pay | Admitting: Internal Medicine

## 2019-04-08 ENCOUNTER — Other Ambulatory Visit: Payer: Self-pay

## 2019-04-16 ENCOUNTER — Other Ambulatory Visit (HOSPITAL_COMMUNITY): Payer: Self-pay | Admitting: Internal Medicine

## 2019-04-18 ENCOUNTER — Other Ambulatory Visit: Payer: Self-pay | Admitting: Physician Assistant

## 2019-04-21 ENCOUNTER — Ambulatory Visit
Admission: RE | Admit: 2019-04-21 | Discharge: 2019-04-21 | Disposition: A | Discharge status: Home / Self Care | Payer: Medicare Other | Source: Ambulatory Visit | Attending: Family Medicine | Admitting: Family Medicine

## 2019-04-21 ENCOUNTER — Other Ambulatory Visit: Payer: Self-pay | Admitting: Family Medicine

## 2019-04-21 DIAGNOSIS — J90 Pleural effusion, not elsewhere classified: Secondary | ICD-10-CM | POA: Diagnosis not present

## 2019-04-21 DIAGNOSIS — R062 Wheezing: Secondary | ICD-10-CM

## 2019-04-23 ENCOUNTER — Ambulatory Visit (INDEPENDENT_AMBULATORY_CARE_PROVIDER_SITE_OTHER): Payer: Medicare Other | Admitting: *Deleted

## 2019-04-23 DIAGNOSIS — F419 Anxiety disorder, unspecified: Secondary | ICD-10-CM | POA: Diagnosis not present

## 2019-04-23 DIAGNOSIS — I5022 Chronic systolic (congestive) heart failure: Secondary | ICD-10-CM | POA: Diagnosis not present

## 2019-04-23 DIAGNOSIS — J189 Pneumonia, unspecified organism: Secondary | ICD-10-CM | POA: Diagnosis not present

## 2019-04-23 LAB — CUP PACEART REMOTE DEVICE CHECK
Battery Remaining Longevity: 56 mo
Battery Voltage: 2.97 V
Brady Statistic AP VP Percent: 83 %
Brady Statistic AP VS Percent: 1.43 %
Brady Statistic AS VP Percent: 15.14 %
Brady Statistic AS VS Percent: 0.43 %
Brady Statistic RA Percent Paced: 83.52 %
Brady Statistic RV Percent Paced: 0.3 %
Date Time Interrogation Session: 20210325033425
HighPow Impedance: 39 Ohm
HighPow Impedance: 65 Ohm
Implantable Lead Implant Date: 20010806
Implantable Lead Implant Date: 20180813
Implantable Lead Implant Date: 20180813
Implantable Lead Location: 753858
Implantable Lead Location: 753859
Implantable Lead Location: 753860
Implantable Lead Model: 147
Implantable Lead Model: 5092
Implantable Lead Serial Number: 104581
Implantable Pulse Generator Implant Date: 20180813
Lead Channel Impedance Value: 165.029
Lead Channel Impedance Value: 172.541
Lead Channel Impedance Value: 176 Ohm
Lead Channel Impedance Value: 193.707
Lead Channel Impedance Value: 204.14 Ohm
Lead Channel Impedance Value: 304 Ohm
Lead Channel Impedance Value: 361 Ohm
Lead Channel Impedance Value: 399 Ohm
Lead Channel Impedance Value: 418 Ohm
Lead Channel Impedance Value: 418 Ohm
Lead Channel Impedance Value: 551 Ohm
Lead Channel Impedance Value: 589 Ohm
Lead Channel Impedance Value: 608 Ohm
Lead Channel Impedance Value: 646 Ohm
Lead Channel Impedance Value: 646 Ohm
Lead Channel Impedance Value: 646 Ohm
Lead Channel Impedance Value: 646 Ohm
Lead Channel Impedance Value: 703 Ohm
Lead Channel Pacing Threshold Amplitude: 0.625 V
Lead Channel Pacing Threshold Amplitude: 1.375 V
Lead Channel Pacing Threshold Amplitude: 1.5 V
Lead Channel Pacing Threshold Pulse Width: 0.4 ms
Lead Channel Pacing Threshold Pulse Width: 0.4 ms
Lead Channel Pacing Threshold Pulse Width: 0.4 ms
Lead Channel Sensing Intrinsic Amplitude: 1.875 mV
Lead Channel Sensing Intrinsic Amplitude: 1.875 mV
Lead Channel Sensing Intrinsic Amplitude: 15.375 mV
Lead Channel Sensing Intrinsic Amplitude: 15.375 mV
Lead Channel Setting Pacing Amplitude: 1.75 V
Lead Channel Setting Pacing Amplitude: 2 V
Lead Channel Setting Pacing Amplitude: 3 V
Lead Channel Setting Pacing Pulse Width: 0.4 ms
Lead Channel Setting Pacing Pulse Width: 0.4 ms
Lead Channel Setting Sensing Sensitivity: 0.3 mV

## 2019-04-23 NOTE — Progress Notes (Signed)
ICD Remote  

## 2019-04-24 ENCOUNTER — Ambulatory Visit (INDEPENDENT_AMBULATORY_CARE_PROVIDER_SITE_OTHER): Payer: Medicare Other

## 2019-04-24 DIAGNOSIS — I5022 Chronic systolic (congestive) heart failure: Secondary | ICD-10-CM | POA: Diagnosis not present

## 2019-04-24 DIAGNOSIS — Z95810 Presence of automatic (implantable) cardiac defibrillator: Secondary | ICD-10-CM | POA: Diagnosis not present

## 2019-04-24 NOTE — Progress Notes (Signed)
EPIC Encounter for ICM Monitoring  Patient Name: [name] is a 79 y.o. male Date: 04/24/2019 Primary Care Physican: [name], MD Primary Cardiologist:Kelly/Bensimhon Electrophysiologist: [name] BiV Pacing:97.2% 2/16/2021Weight: 134-135lbs   Spoke with patient and denies any fluid symptoms.  Was taking antibiotics for lung infection.  Optivol thoracic impedancesuggesting possible fluid accumulation since 03/29/2019.  Prescribed:Torsemide 20 mg 1 tabletdaily. Potassium 10 mEq take 0.5 tablet daily.  Labs: 01/13/2021Creatinine0.92, BUN 16, Potassium4.6, Sodium 134, GFR >60 03/24/2018 Creatinine 1.00, BUN 15, Potassium 3.7, Sodium 136, GFR >60 A complete set of results can be found in Results Review  Recommendations:  Advised to take Torsemide 2 tablets x 2 days and Potassium 1 tablet x 2 day then return to prescribed dosage.  Repeated instructions until patient was able to repeat them back correctly.    Follow-up plan: ICM clinic phone appointment on3/29/2021 (manual send) to recheck fluid levels. 91 day device clinic remote transmission 07/23/2019. Office appt4/19/2021 with [place].   Copy of ICM check sent to [place].  3 month ICM trend: 04/23/2019    1 Year ICM trend:      [name], RN 04/24/2019 9:43 AM

## 2019-04-27 ENCOUNTER — Ambulatory Visit (INDEPENDENT_AMBULATORY_CARE_PROVIDER_SITE_OTHER): Payer: Medicare Other

## 2019-04-27 DIAGNOSIS — I5022 Chronic systolic (congestive) heart failure: Secondary | ICD-10-CM

## 2019-04-27 DIAGNOSIS — Z95810 Presence of automatic (implantable) cardiac defibrillator: Secondary | ICD-10-CM

## 2019-04-27 NOTE — Progress Notes (Signed)
EPIC Encounter for ICM Monitoring  Patient Name: [name] is a 79 y.o. male Date: 04/27/2019 Primary Care Physican: [name], MD Primary Cardiologist:Kelly/Bensimhon Electrophysiologist: [name] BiV Pacing:97.2% 2/16/2021Weight: 134-135lbs   Spoke with patient and denies any fluid symptoms. He reports they have been making meals at home more than eating out in the last week.   Optivol thoracic impedancereturned to normal since taking extra Torsemide x 2 days.   Prescribed:Torsemide 20 mg 1 tabletdaily. Potassium 10 mEq take 0.5 tablet daily.  Labs: 01/13/2021Creatinine0.92, BUN 16, Potassium4.6, Sodium 134, GFR >60 03/24/2018 Creatinine 1.00, BUN 15, Potassium 3.7, Sodium 136, GFR >60 A complete set of results can be found in Results Review  Recommendations: Recommendation to limit salt intake to 2000 mg daily and fluid intake to 64 oz daily.  Encouraged to call if experiencing any fluid symptoms.    Follow-up plan: ICM clinic phone appointment on4/26/2021. 91 day device clinic remote transmission 07/23/2019. Office appt4/19/2021 with [place].   3 month ICM trend: 04/27/2019    1 Year ICM trend:       [name], RN 04/27/2019 3:19 PM

## 2019-04-28 ENCOUNTER — Ambulatory Visit (INDEPENDENT_AMBULATORY_CARE_PROVIDER_SITE_OTHER): Payer: Medicare Other | Admitting: Pharmacist Clinician (PhC)/ Clinical Pharmacy Specialist

## 2019-04-28 ENCOUNTER — Other Ambulatory Visit: Payer: Self-pay

## 2019-04-28 DIAGNOSIS — I480 Paroxysmal atrial fibrillation: Secondary | ICD-10-CM | POA: Diagnosis not present

## 2019-04-28 DIAGNOSIS — Z7901 Long term (current) use of anticoagulants: Secondary | ICD-10-CM

## 2019-04-28 DIAGNOSIS — Z5181 Encounter for therapeutic drug level monitoring: Secondary | ICD-10-CM

## 2019-04-28 DIAGNOSIS — I4891 Unspecified atrial fibrillation: Secondary | ICD-10-CM

## 2019-04-28 LAB — POCT INR: INR: 3.1 — AB (ref 2.0–3.0)

## 2019-04-29 ENCOUNTER — Other Ambulatory Visit: Payer: Self-pay

## 2019-04-29 MED ORDER — BISOPROLOL FUMARATE 5 MG PO TABS: ORAL_TABLET | ORAL | 1 refills | Status: DC

## 2019-04-29 MED ORDER — LOSARTAN POTASSIUM 25 MG PO TABS: 25.0000 mg | ORAL_TABLET | Freq: Two times a day (BID) | ORAL | 1 refills | Status: DC

## 2019-04-29 MED ORDER — ISOSORBIDE MONONITRATE ER 30 MG PO TB24: 15.0000 mg | ORAL_TABLET | Freq: Every day | ORAL | 1 refills | Status: DC

## 2019-04-30 MED ORDER — WARFARIN SODIUM 5 MG PO TABS: ORAL_TABLET | ORAL | 0 refills | Status: DC

## 2019-05-04 ENCOUNTER — Telehealth: Payer: Self-pay

## 2019-05-04 NOTE — Telephone Encounter (Signed)
[facility]  Today rec'd a refill request for Bentyl and omeprazole to be sent to [facility] order, 90 day supply. LM for pt.  Is he wanting his prescriptions transferred to [facility].  If he picked up his omeprazole, 90 day supply already he can request new script to be sent there in a few months.

## 2019-05-05 ENCOUNTER — Other Ambulatory Visit (HOSPITAL_COMMUNITY): Payer: Self-pay

## 2019-05-05 ENCOUNTER — Other Ambulatory Visit: Payer: Self-pay | Admitting: Pharmacist

## 2019-05-05 MED ORDER — DICYCLOMINE HCL 10 MG PO CAPS: 10.0000 mg | ORAL_CAPSULE | Freq: Three times a day (TID) | ORAL | 1 refills | Status: DC | PRN

## 2019-05-05 MED ORDER — SPIRONOLACTONE 25 MG PO TABS: 25.0000 mg | ORAL_TABLET | Freq: Every day | ORAL | 3 refills | Status: DC

## 2019-05-05 MED ORDER — TORSEMIDE 20 MG PO TABS: 20.0000 mg | ORAL_TABLET | Freq: Every day | ORAL | 3 refills | Status: DC

## 2019-05-05 MED ORDER — POTASSIUM CHLORIDE ER 10 MEQ PO TBCR: 5.0000 meq | EXTENDED_RELEASE_TABLET | Freq: Every day | ORAL | 3 refills | Status: DC

## 2019-05-05 MED ORDER — OMEPRAZOLE 20 MG PO CPDR: DELAYED_RELEASE_CAPSULE | ORAL | 1 refills | Status: DC

## 2019-05-05 MED ORDER — ISOSORBIDE MONONITRATE ER 30 MG PO TB24: 15.0000 mg | ORAL_TABLET | Freq: Every day | ORAL | 3 refills | Status: DC

## 2019-05-05 MED ORDER — LOSARTAN POTASSIUM 25 MG PO TABS: 25.0000 mg | ORAL_TABLET | Freq: Two times a day (BID) | ORAL | 3 refills | Status: DC

## 2019-05-05 MED ORDER — BISOPROLOL FUMARATE 5 MG PO TABS: ORAL_TABLET | ORAL | 3 refills | Status: DC

## 2019-05-05 MED ORDER — WARFARIN SODIUM 5 MG PO TABS: ORAL_TABLET | ORAL | 1 refills | Status: DC

## 2019-05-05 NOTE — Telephone Encounter (Signed)
Left another message for pt to call back.

## 2019-05-05 NOTE — Telephone Encounter (Signed)
Called and spoke to pt.  He did pick up the 90 day supply of Prilosec from [facility] last month but he needs a refill of Bentyl sent to [facility].  He requests his "refills" for omeprazole to be sent to [facility].  New scripts sent to [facility].

## 2019-05-07 DIAGNOSIS — M25561 Pain in right knee: Secondary | ICD-10-CM | POA: Diagnosis not present

## 2019-05-07 DIAGNOSIS — M109 Gout, unspecified: Secondary | ICD-10-CM | POA: Diagnosis not present

## 2019-05-07 DIAGNOSIS — M25431 Effusion, right wrist: Secondary | ICD-10-CM | POA: Diagnosis not present

## 2019-05-07 DIAGNOSIS — M1990 Unspecified osteoarthritis, unspecified site: Secondary | ICD-10-CM | POA: Diagnosis not present

## 2019-05-07 DIAGNOSIS — I509 Heart failure, unspecified: Secondary | ICD-10-CM | POA: Diagnosis not present

## 2019-05-07 DIAGNOSIS — M25539 Pain in unspecified wrist: Secondary | ICD-10-CM | POA: Diagnosis not present

## 2019-05-07 DIAGNOSIS — Z79899 Other long term (current) drug therapy: Secondary | ICD-10-CM | POA: Diagnosis not present

## 2019-05-07 DIAGNOSIS — M1120 Other chondrocalcinosis, unspecified site: Secondary | ICD-10-CM | POA: Diagnosis not present

## 2019-05-09 ENCOUNTER — Other Ambulatory Visit: Payer: Self-pay | Admitting: Cardiology

## 2019-05-09 ENCOUNTER — Telehealth: Payer: Self-pay | Admitting: Cardiology

## 2019-05-09 DIAGNOSIS — Z79899 Other long term (current) drug therapy: Secondary | ICD-10-CM

## 2019-05-09 NOTE — Telephone Encounter (Signed)
2 pages received from on call answering service regarding patient's increasing ankle edema.  Attempted to call x2 with phone call going straight to voicemail.  Will await another page and try back once again.  Kathyrn Drown NP-C [place] Pager: [phone]

## 2019-05-09 NOTE — Telephone Encounter (Signed)
Finally got in touch with the patient who states that he has ben having moderate ankle swelling for the last several days. Dose report he has been on a Prednisone taper for a gout flare for which he will be finishing early next week. He denies SOB. Reports that he shoes are only slightly tight however is still able to put them on. States he has had about a 2 lb weight increase.   Given stable creatinine on last lab work, will increase torsemide for three days then resume regular dosing. Will also increased K+ supplement to 42meq for those days as well. Will need labs drawn next week and is to call the office and report if he has had symptom improvement.    Kathyrn Drown NP-C [place] Pager: [phone]

## 2019-05-11 ENCOUNTER — Telehealth: Payer: Self-pay

## 2019-05-11 ENCOUNTER — Other Ambulatory Visit: Payer: Self-pay

## 2019-05-11 ENCOUNTER — Other Ambulatory Visit: Payer: Medicare HMO | Admitting: *Deleted

## 2019-05-11 DIAGNOSIS — Z79899 Other long term (current) drug therapy: Secondary | ICD-10-CM | POA: Diagnosis not present

## 2019-05-11 LAB — BASIC METABOLIC PANEL
BUN/Creatinine Ratio: 24 (ref 10–24)
BUN: 22 mg/dL (ref 8–27)
CO2: 24 mmol/L (ref 20–29)
Calcium: 9.7 mg/dL (ref 8.6–10.2)
Chloride: 97 mmol/L (ref 96–106)
Creatinine, Ser: 0.92 mg/dL (ref 0.76–1.27)
GFR calc Af Amer: 91 mL/min/{1.73_m2} (ref >59)
GFR calc non Af Amer: 79 mL/min/{1.73_m2} (ref >59)
Glucose: 75 mg/dL (ref 65–99)
Potassium: 3.4 mmol/L — ABNORMAL LOW (ref 3.5–5.2)
Sodium: 137 mmol/L (ref 134–144)

## 2019-05-11 NOTE — Telephone Encounter (Signed)
-----   Message from Tommie Raymond, NP sent at 05/09/2019  4:11 PM EDT ----- Regarding: Lab This is a patient of Dr. [name] who called over the weekend with edema. I will need him to have lab work completed however did not know who to message from their office. Can someone help me to schedule this and call the patient with date and time of lab work appointment. Will place BMET order   Thank you  Sharee Pimple

## 2019-05-11 NOTE — Telephone Encounter (Signed)
LMTCB... pt is scheduled today at 1:30 for labs.

## 2019-05-15 ENCOUNTER — Other Ambulatory Visit: Payer: Self-pay

## 2019-05-15 ENCOUNTER — Ambulatory Visit (INDEPENDENT_AMBULATORY_CARE_PROVIDER_SITE_OTHER): Payer: Medicare HMO | Admitting: Pharmacist Clinician (PhC)/ Clinical Pharmacy Specialist

## 2019-05-15 DIAGNOSIS — I4891 Unspecified atrial fibrillation: Secondary | ICD-10-CM

## 2019-05-15 DIAGNOSIS — Z7901 Long term (current) use of anticoagulants: Secondary | ICD-10-CM

## 2019-05-15 DIAGNOSIS — Z5181 Encounter for therapeutic drug level monitoring: Secondary | ICD-10-CM

## 2019-05-15 DIAGNOSIS — I480 Paroxysmal atrial fibrillation: Secondary | ICD-10-CM | POA: Diagnosis not present

## 2019-05-15 LAB — POCT INR: INR: 4.3 — AB (ref 2.0–3.0)

## 2019-05-18 ENCOUNTER — Encounter (HOSPITAL_COMMUNITY): Payer: Self-pay | Admitting: Internal Medicine

## 2019-05-18 ENCOUNTER — Other Ambulatory Visit: Payer: Self-pay

## 2019-05-18 ENCOUNTER — Ambulatory Visit (HOSPITAL_BASED_OUTPATIENT_CLINIC_OR_DEPARTMENT_OTHER)
Admission: RE | Admit: 2019-05-18 | Discharge: 2019-05-18 | Disposition: A | Discharge status: Home / Self Care | Payer: Medicare HMO | Source: Ambulatory Visit | Attending: Internal Medicine | Admitting: Internal Medicine

## 2019-05-18 ENCOUNTER — Ambulatory Visit (HOSPITAL_COMMUNITY)
Admission: RE | Admit: 2019-05-18 | Discharge: 2019-05-18 | Disposition: A | Discharge status: Home / Self Care | Payer: Medicare HMO | Source: Ambulatory Visit | Attending: Internal Medicine | Admitting: Internal Medicine

## 2019-05-18 VITALS — BP 94/60 | HR 87 | Wt 137.8 lb

## 2019-05-18 DIAGNOSIS — I447 Left bundle-branch block, unspecified: Secondary | ICD-10-CM | POA: Insufficient documentation

## 2019-05-18 DIAGNOSIS — K219 Gastro-esophageal reflux disease without esophagitis: Secondary | ICD-10-CM | POA: Insufficient documentation

## 2019-05-18 DIAGNOSIS — I255 Ischemic cardiomyopathy: Secondary | ICD-10-CM | POA: Diagnosis not present

## 2019-05-18 DIAGNOSIS — Z823 Family history of stroke: Secondary | ICD-10-CM | POA: Insufficient documentation

## 2019-05-18 DIAGNOSIS — Z820 Family history of epilepsy and other diseases of the nervous system: Secondary | ICD-10-CM | POA: Insufficient documentation

## 2019-05-18 DIAGNOSIS — I2510 Atherosclerotic heart disease of native coronary artery without angina pectoris: Secondary | ICD-10-CM

## 2019-05-18 DIAGNOSIS — I252 Old myocardial infarction: Secondary | ICD-10-CM | POA: Insufficient documentation

## 2019-05-18 DIAGNOSIS — I340 Nonrheumatic mitral (valve) insufficiency: Secondary | ICD-10-CM | POA: Diagnosis not present

## 2019-05-18 DIAGNOSIS — Z7901 Long term (current) use of anticoagulants: Secondary | ICD-10-CM | POA: Diagnosis not present

## 2019-05-18 DIAGNOSIS — Z8249 Family history of ischemic heart disease and other diseases of the circulatory system: Secondary | ICD-10-CM | POA: Diagnosis not present

## 2019-05-18 DIAGNOSIS — Z79899 Other long term (current) drug therapy: Secondary | ICD-10-CM | POA: Diagnosis not present

## 2019-05-18 DIAGNOSIS — Z7951 Long term (current) use of inhaled steroids: Secondary | ICD-10-CM | POA: Diagnosis not present

## 2019-05-18 DIAGNOSIS — I5022 Chronic systolic (congestive) heart failure: Secondary | ICD-10-CM | POA: Insufficient documentation

## 2019-05-18 DIAGNOSIS — Z951 Presence of aortocoronary bypass graft: Secondary | ICD-10-CM | POA: Insufficient documentation

## 2019-05-18 DIAGNOSIS — Z880 Allergy status to penicillin: Secondary | ICD-10-CM | POA: Diagnosis not present

## 2019-05-18 DIAGNOSIS — Z95810 Presence of automatic (implantable) cardiac defibrillator: Secondary | ICD-10-CM | POA: Diagnosis not present

## 2019-05-18 DIAGNOSIS — I480 Paroxysmal atrial fibrillation: Secondary | ICD-10-CM | POA: Insufficient documentation

## 2019-05-18 LAB — BASIC METABOLIC PANEL
Anion gap: 11 (ref 5–15)
BUN: 15 mg/dL (ref 8–23)
CO2: 26 mmol/L (ref 22–32)
Calcium: 9.1 mg/dL (ref 8.9–10.3)
Chloride: 98 mmol/L (ref 98–111)
Creatinine, Ser: 0.88 mg/dL (ref 0.61–1.24)
GFR calc Af Amer: >60 mL/min (ref >60)
GFR calc non Af Amer: >60 mL/min (ref >60)
Glucose, Bld: 86 mg/dL (ref 70–99)
Potassium: 3.9 mmol/L (ref 3.5–5.1)
Sodium: 135 mmol/L (ref 135–145)

## 2019-05-18 LAB — BRAIN NATRIURETIC PEPTIDE: B Natriuretic Peptide: 1444.9 pg/mL — ABNORMAL HIGH (ref 0.0–100.0)

## 2019-05-18 MED ORDER — FARXIGA 10 MG PO TABS: 10.0000 mg | ORAL_TABLET | Freq: Every day | ORAL | 6 refills | Status: DC

## 2019-05-18 NOTE — Progress Notes (Signed)
  Echocardiogram 2D Echocardiogram has been performed.  [name] 05/18/2019, 12:04 PM

## 2019-05-18 NOTE — Patient Instructions (Addendum)
Increase Torsemide to 40 mg (2 tabs) daily FOR 2 DAYS ONLY, then back to 20 mg daily  Increase Potassium to to 20 meq (2 tabs) daily FOR 2 DAYS ONLY  Start Farxiga 10 mg daily  Labs done today, your results will be available in MyChart, we will contact you for abnormal readings.  Your physician has recommended that you have a cardiopulmonary stress test (CPX). CPX testing is a non-invasive measurement of heart and lung function. It replaces a traditional treadmill stress test. This type of test provides a tremendous amount of information that relates not only to your present condition but also for future outcomes. This test combines measurements of you ventilation, respiratory gas exchange in the lungs, electrocardiogram (EKG), blood pressure and physical response before, during, and following an exercise protocol.  Your physician recommends that you schedule a follow-up appointment in: 6 weeks  If you have any questions or concerns before your next appointment please send [name] a message through [place] or call our office at [phone].  At the [facility], you and your health needs are our priority. As part of our continuing mission to provide you with exceptional heart care, we have created designated Provider Care Teams. These Care Teams include your primary Cardiologist (physician) and Advanced Practice Providers (APPs- Physician Assistants and Nurse Practitioners) who all work together to provide you with the care you need, when you need it.   You may see any of the following providers on your designated Care Team at your next follow up: Marland Kitchen Dr Glori Bickers . Dr Loralie Champagne . Darrick Grinder, NP . Lyda Jester, PA . Audry Riles, PharmD   Please be sure to bring in all your medications bottles to every appointment.

## 2019-05-18 NOTE — Progress Notes (Signed)
Patient ID: [name], male   DOB: 10/26/1940, 79 y.o.   MRN: [id number]    Advanced Heart Failure Clinic Note   Date:  05/18/2019   ID:  [name] Sep 20, 1940, MRN [id number]  PCP:  [name], MD  Primary Cardiologist:  [name] Referring: Dr. [name]   History of Present Illness: [name] is a 79 y.o. 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 In September 1998 he underwent CABG revascularization surgery after an unsuccessful attempt at stenting of his proximal LAD by [name]. Ejection fraction was 25-30%. In 2002 he underwent initial ICD implantation for nonsustained ventricular tachycardia documented on event monitor for primary prevention. In January 2010 he underwent generator change with a Medtronic Virtuoso single chamber cardioverter defibrillator.  A nuclear perfusion study in November 2013 showed a large area of scar in the LAD territory (extent 44%) involving the mid to apical anterior,apical and infero-apical to mid infero-septal and apical lateral wall without associated ischemia.  He is on Coumadin anticoagulation for PAF. He was readmitted to the hospital from a May 2 through 06/04/2014 with recurrent atrial fibrillation and started on Tikosyn. He was enrolled in the genetic AF trial (bucindolol vs Toprol). He did poorly in the trial in the setting of titration of beta-blocker. He was referred to HF Clinic. Genetic AF study completed for pt. He is now on bisoprol 2.5 mg daily.   He has been discussing CRT upgrade with Dr. 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    He presents today for follow up. Doing well. Remains active.Gets 5-6k steps/day. No exertional SOB. Over last 2 months has had torsemide bumped from 20 daily to 40 daily for several days by Sharman Cheek for  elevated Optivol readings with good result. He is watching his diet closely. Low salt. Not eating out much. Had a recent flare of pseudogout and treated with prednisone x 5 days   ICD  interrogated: No VT/AF. 100% Biv pacing. Optivol up but coming down  Personally reviewed   Echo 04/04/17 reviewed by Dr [name]. EF 20% with mild to mod reduction RV function.   CPX 02/17/16 Resting HR: 73 Peak HR: 108  (75% age predicted max HR) BP rest: 110/58 BP peak: 150/56 Peak VO2: 14.4 (58% predicted peak VO2) VE/VCO2 slope: 33 OUES: 1.32 Peak RER: 1.10 PETCO2 at peak: 33 O2pulse: 9  (82% predicted O2pulse)  Echo 12/17 EF 15-20%, Moderate MR, RV with reduced function Echo 5/17 EF 20% RV mildly down.  Echo 3/19: EF 20%, RV mild to moderate reduced function    Labs 10/04/15 K 5.3, Creatinine 0.85, BUN 15  Past Medical History:  Diagnosis Date  . Acute on chronic systolic CHF (congestive heart failure) ([place]) 06/09/2015  . 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 Cardiomyopathy, ischemic 2011   with EF 25-35% by echo  . Coronary artery disease    Hx MI 1992, CABG 1998 , Nuc study 11.2013 large scar but no ischemia  . GERD (gastroesophageal reflux disease)   . H. pylori infection    Hx of   . 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 NSVT (nonsustained ventricular tachycardia) ([place])   . Paroxysmal atrial fibrillation ([facility])  Current Outpatient Medications  Medication Sig Dispense Refill  . acetaminophen (TYLENOL) 650 MG CR tablet Take 650 mg by mouth 2 (two) times daily.     Marland Kitchen allopurinol (ZYLOPRIM) 300 MG tablet Take 300 mg by mouth daily.    . AMBULATORY NON FORMULARY MEDICATION Medication Name: Hydrocortisone cream 1%: Apply to the rectum once a day for a week, then as needed    . AMBULATORY NON FORMULARY MEDICATION IBgard- Take as directed 16 capsule 0  . AMBULATORY NON FORMULARY MEDICATION  Medication Name: FDgard - use as directed 12 capsule 0  . bisoprolol (ZEBETA) 5 MG tablet TAKE 1/2 (ONE-HALF) TABLET BY MOUTH ONCE DAILY 45 tablet 3  . cetirizine (ZYRTEC) 10 MG tablet Take 10 mg by mouth daily.      . Cholecalciferol (VITAMIN D) 2000 units CAPS Take 2,000-4,000 capsules by mouth daily.     Marland Kitchen dicyclomine (BENTYL) 10 MG capsule Take 1 capsule (10 mg total) by mouth 3 (three) times daily as needed for spasms. 270 capsule 1  . hyoscyamine (LEVSIN SL) 0.125 MG SL tablet Place 1 tablet (0.125 mg total) under the tongue every 4 (four) hours as needed. 15 tablet 0  . isosorbide mononitrate (IMDUR) 30 MG 24 hr tablet Take 0.5 tablets (15 mg total) by mouth daily. 45 tablet 3  . LORazepam (ATIVAN) 1 MG tablet Take 0.25-0.5 mg by mouth at bedtime.     Marland Kitchen losartan (COZAAR) 25 MG tablet Take 1 tablet (25 mg total) by mouth 2 (two) times daily. 180 tablet 3  . magnesium oxide (MAG-OX) 400 MG tablet Take 400 mg by mouth daily at 12 noon.     . mometasone (ASMANEX) 220 MCG/INH inhaler Inhale 1 puff into the lungs daily.     . montelukast (SINGULAIR) 10 MG tablet Take 1 tablet (10 mg total) by mouth at bedtime. PLEASE SCHEDULE APPOINTMENT. 30 tablet 0  . omeprazole (PRILOSEC) 20 MG capsule TAKE 1 CAPSULE BY MOUTH EVERY DAY IN THE MORNING 90 capsule 1  . Polyethyl Glycol-Propyl Glycol (SYSTANE OP) Apply 2-3 drops to eye 3 (three) times daily as needed (dry eyes).    . potassium chloride (KLOR-CON) 10 MEQ tablet Take 0.5 tablets (5 mEq total) by mouth daily. 45 tablet 3  . potassium chloride (KLOR-CON) 10 MEQ tablet     . simvastatin (ZOCOR) 40 MG tablet TAKE 1 TABLET BY MOUTH AT BEDTIME 90 tablet 0  . spironolactone (ALDACTONE) 25 MG tablet Take 1 tablet (25 mg total) by mouth daily. 90 tablet 3  . tamsulosin (FLOMAX) 0.4 MG CAPS capsule Take 0.4 mg by mouth daily.    Marland Kitchen torsemide (DEMADEX) 20 MG tablet Take 1 tablet (20 mg total) by mouth daily. 90 tablet 3  . warfarin (COUMADIN) 5 MG tablet Take  1/2 to 1 tablet by mouth daily as directed 90 tablet 1   No current facility-administered medications for this encounter.    Allergies:    Allergies  Allergen Reactions  . Amoxicillin Rash    Has patient had a PCN reaction causing immediate rash, facial/tongue/throat swelling, SOB or lightheadedness with hypotension: Yes Has patient had a PCN reaction causing severe rash involving mucus membranes or skin necrosis: No Has patient had a PCN reaction that required hospitalization: No Has patient had a PCN reaction occurring within the last 10 years: No If all of the above answers are "NO", then may proceed with Cephalosporin use.   Marland Kitchen Penicillins Rash    Has patient had a PCN  reaction causing immediate rash, facial/tongue/throat swelling, SOB or lightheadedness with hypotension: Yes Has patient had a PCN reaction causing severe rash involving mucus membranes or skin necrosis: No Has patient had a PCN reaction that required hospitalization: No Has patient had a PCN reaction occurring within the last 10 years: No If all of the above answers are "NO", then may proceed with Cephalosporin use.     Social History:  The patient  reports that he has never smoked. He has never used smokeless tobacco. He reports current alcohol use of about 2.0 standard drinks of alcohol per week. He reports that he does not use drugs.   Family history:   Family History  Problem Relation Age of Onset  . Heart disease Father        questionable  . Stroke Mother   . Heart failure Sister   . Healthy Brother   . Healthy Sister   . Parkinsonism Brother   . Colon cancer Neg Hx    Review of systems complete and found to be negative unless listed in HPI.    PHYSICAL EXAM: Vitals:   05/18/19 1212  BP: 94/60  Pulse: 87  SpO2: 95%  Weight: 62.5 kg (137 lb 12.8 oz)   Wt Readings from Last 3 Encounters:  05/18/19 62.5 kg (137 lb 12.8 oz)  02/11/19 64 kg (141 lb)  12/10/18 64 kg (141 lb 3.2 oz)   General:   Well appearing. No resp difficulty HEENT: normal Neck: supple. JVP to jaw  Carotids 2+ bilat; no bruits. No lymphadenopathy or thryomegaly appreciated. Cor: PMI nondisplaced. Regular rate & rhythm. 3/6 MR. Lungs: clear Abdomen: soft, nontender, nondistended. No hepatosplenomegaly. No bruits or masses. Good bowel sounds. Extremities: no cyanosis, clubbing, rash, tr edema Neuro: alert & orientedx3, cranial nerves grossly intact. moves all 4 extremities w/o difficulty. Affect pleasant  ICD interrogated personally. No VT/AF. Volume up since mid November. Now above threshold. Personally reviewed  ReDS 42%    ASSESSMENT AND PLAN:  1. 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  - Volume status elevated again on exam and on ICD interrogation and ReDS. - Echo today (05/18/19) EF 20% with moderate RV dysfunction. Severe MR, mod AI, mod TR. Reviewed with him and his wife personally  - Rash after taking Entresto.  - Continue losartan 25 bid - Continue torsemide at 20 daily - will double for 2 days with volume overload then add Farxiga to help control volume  - Continue spiro 25 mg daily - Continue bisoprolol 2.5 mg daily. Unable to tolerate higher.  - Labs today  - Long talk about advanced options today. Not candidate for transplant with age. Given RV dysfunction and age I also worry about VAD candidacy. With severity of MR may be good MitraClip candidate to slow progression of HD. Will repeat CPX and bring him back in 6 weeks to discuss and consider TEE.   2. PAF - Maintaining NSR on exam and ICD - Continue coumadin. Does not want Eliquis due to cost - No bleeding.    3. CAD s/p previous anterior infarct - No s/sof ischemia currently  - Off ASA due to warfarin. Continue statin. - Recent LDL 76 in 1/19  4. LBBB - Now s/p CRT. 100% biv pacing on device  Total time spent 45  minutes. Over half that time spent discussing above.  Glori Bickers, MD  12:31 PM

## 2019-05-22 ENCOUNTER — Telehealth (HOSPITAL_COMMUNITY): Payer: Self-pay | Admitting: Pharmacist

## 2019-05-22 NOTE — Telephone Encounter (Signed)
Received message from [employer] that prescription portion of patient assistance application was missing. Sent in Provider portion of Manufacturer's Assistance application to [employer] for [place].   Application pending, will continue to follow.   Audry Riles, PharmD, BCPS, BCCP, CPP Heart Failure Clinic Pharmacist [phone]'

## 2019-05-25 ENCOUNTER — Ambulatory Visit (INDEPENDENT_AMBULATORY_CARE_PROVIDER_SITE_OTHER): Payer: Medicare HMO

## 2019-05-25 DIAGNOSIS — Z95810 Presence of automatic (implantable) cardiac defibrillator: Secondary | ICD-10-CM | POA: Diagnosis not present

## 2019-05-25 DIAGNOSIS — I5022 Chronic systolic (congestive) heart failure: Secondary | ICD-10-CM | POA: Diagnosis not present

## 2019-05-25 NOTE — Progress Notes (Signed)
EPIC Encounter for ICM Monitoring  Patient Name: [name] is a 79 y.o. male Date: 05/25/2019 Primary Care Physican: [name], MD Primary Cardiologist:Kelly/Bensimhon Electrophysiologist: [name] BiV Pacing:97% 4/26/2021Weight: 134-135lbs   Time in AT/AF  0.0 hr/day (0.0%) (on Coumadin)   Spoke with patient anddenies any fluid symptoms.  Per Dr Clayborne Dana 4/19 OV note he doubled Torsemide for 2 days with volume overload then add Farxiga to help control volume    Optivol thoracic impedancenormal for past week but was suggesting possible fluid accumulation 4/3-4/20.    Prescribed:Torsemide 20 mg 1 tabletdaily. Potassium 10 mEq take 0.5 tablet daily.  Labs: 05/18/2019 Creatinine 0.88, BUN 15, Potassium 3.9, Sodium 135, GFR >60 05/11/2019 Creatinine 0.92, BUN 22, Potassium 3.4, Sodium 137, GFR 79-91 01/13/2021Creatinine0.92, BUN 16, Potassium4.6, Sodium 134, GFR >60 A complete set of results can be found in Results Review  Recommendations:Recommendation to limit salt intake to 2000 mg daily and fluid intake to 64 oz daily.  Encouraged to call if experiencing any fluid symptoms.   Follow-up plan: ICM clinic phone appointment on6/01/2019. 91 day device clinic remote transmission6/24/2021. Office appt6/14/2021 with [place].  3 month ICM trend: 05/25/2019    1 Year ICM trend:       [name], RN 05/25/2019 4:30 PM

## 2019-05-26 NOTE — Telephone Encounter (Signed)
Patient Advocate Encounter   Completed tier exception for [place].    PA submitted via fax.  Status is pending   Will continue to follow.  Audry Riles, PharmD, BCPS, BCCP, CPP Heart Failure Clinic Pharmacist [phone]

## 2019-05-27 ENCOUNTER — Ambulatory Visit
Admission: RE | Admit: 2019-05-27 | Discharge: 2019-05-27 | Disposition: A | Discharge status: Home / Self Care | Payer: Medicare HMO | Source: Ambulatory Visit | Attending: Family Medicine | Admitting: Family Medicine

## 2019-05-27 ENCOUNTER — Telehealth (HOSPITAL_COMMUNITY): Payer: Self-pay | Admitting: Pharmacist

## 2019-05-27 ENCOUNTER — Other Ambulatory Visit: Payer: Self-pay | Admitting: Family Medicine

## 2019-05-27 ENCOUNTER — Other Ambulatory Visit: Payer: Self-pay

## 2019-05-27 DIAGNOSIS — J189 Pneumonia, unspecified organism: Secondary | ICD-10-CM

## 2019-05-27 NOTE — Telephone Encounter (Signed)
Tier exception for [name] was approved through [place]. Authorization is good through 01/29/20. However, he has already been approved by [employer] [place] patient assistance program, so will go through them to obtain medication.  Audry Riles, PharmD, BCPS, BCCP, CPP Heart Failure Clinic Pharmacist [phone]

## 2019-05-27 NOTE — Telephone Encounter (Signed)
Advanced Heart Failure Patient Advocate Encounter   Patient was approved to receive Farxiga from [employer]. Patient will receive medication in 3-5 business days.  Effective dates: 05/21/19 through 05/19/20  Audry Riles, PharmD, BCPS, BCCP, CPP Heart Failure Clinic Pharmacist [phone]

## 2019-06-05 ENCOUNTER — Other Ambulatory Visit: Payer: Self-pay

## 2019-06-05 ENCOUNTER — Other Ambulatory Visit (HOSPITAL_COMMUNITY)
Admission: RE | Admit: 2019-06-05 | Discharge: 2019-06-05 | Disposition: A | Discharge status: Home / Self Care | Payer: Medicare HMO | Source: Ambulatory Visit | Attending: Internal Medicine | Admitting: Internal Medicine

## 2019-06-05 ENCOUNTER — Ambulatory Visit (INDEPENDENT_AMBULATORY_CARE_PROVIDER_SITE_OTHER): Payer: Medicare HMO | Admitting: Pharmacist

## 2019-06-05 DIAGNOSIS — I480 Paroxysmal atrial fibrillation: Secondary | ICD-10-CM

## 2019-06-05 DIAGNOSIS — Z20822 Contact with and (suspected) exposure to covid-19: Secondary | ICD-10-CM | POA: Insufficient documentation

## 2019-06-05 DIAGNOSIS — Z01812 Encounter for preprocedural laboratory examination: Secondary | ICD-10-CM | POA: Diagnosis not present

## 2019-06-05 DIAGNOSIS — Z7901 Long term (current) use of anticoagulants: Secondary | ICD-10-CM

## 2019-06-05 DIAGNOSIS — I4891 Unspecified atrial fibrillation: Secondary | ICD-10-CM

## 2019-06-05 LAB — POCT INR: INR: 2.1 (ref 2.0–3.0)

## 2019-06-06 LAB — SARS CORONAVIRUS 2 (TAT 6-24 HRS): SARS Coronavirus 2: NEGATIVE

## 2019-06-08 ENCOUNTER — Encounter (HOSPITAL_COMMUNITY): Payer: Medicare HMO

## 2019-06-09 ENCOUNTER — Ambulatory Visit (HOSPITAL_COMMUNITY): Payer: Medicare HMO | Attending: Internal Medicine

## 2019-06-09 ENCOUNTER — Other Ambulatory Visit: Payer: Self-pay

## 2019-06-09 ENCOUNTER — Other Ambulatory Visit (HOSPITAL_COMMUNITY): Payer: Self-pay | Admitting: Internal Medicine

## 2019-06-09 ENCOUNTER — Other Ambulatory Visit: Payer: Self-pay | Admitting: Internal Medicine

## 2019-06-09 DIAGNOSIS — I5022 Chronic systolic (congestive) heart failure: Secondary | ICD-10-CM | POA: Insufficient documentation

## 2019-06-30 ENCOUNTER — Ambulatory Visit (INDEPENDENT_AMBULATORY_CARE_PROVIDER_SITE_OTHER): Payer: Medicare HMO

## 2019-06-30 DIAGNOSIS — I5022 Chronic systolic (congestive) heart failure: Secondary | ICD-10-CM | POA: Diagnosis not present

## 2019-06-30 DIAGNOSIS — Z95810 Presence of automatic (implantable) cardiac defibrillator: Secondary | ICD-10-CM

## 2019-06-30 NOTE — Progress Notes (Signed)
EPIC Encounter for ICM Monitoring  Patient Name: DEDERICK LIPSON is a 79 y.o. male Date: 06/30/2019 Primary Care Physican: [name], MD Primary Cardiologist:Kelly/Bensimhon Electrophysiologist: [name] BiV Pacing:97.3% 6/1//2021Weight: 130-135lbs   Time in AT/AF  0.0 hr/day (0.0%) (on Coumadin)   Spoke with patient and reports feeling well at this time.  Denies fluid symptoms.    Optivol thoracic impedancenormal.   Prescribed:Torsemide 20 mg 1 tabletdaily. Potassium 10 mEq take 0.5 tablet daily.  Labs: 05/18/2019 Creatinine 0.88, BUN 15, Potassium 3.9, Sodium 135, GFR >60 05/11/2019 Creatinine 0.92, BUN 22, Potassium 3.4, Sodium 137, GFR 79-91 01/13/2021Creatinine0.92, BUN 16, Potassium4.6, Sodium 134, GFR >60 A complete set of results can be found in Results Review  Recommendations:Left voice mail with ICM number and encouraged to call if experiencing any fluid symptoms.  Follow-up plan: ICM clinic phone appointment on7/03/2019. 91 day device clinic remote transmission6/24/2021. Office appt6/14/2021 with [place].  3 month ICM trend: 06/30/2019    1 Year ICM trend:       [name], RN 06/30/2019 1:06 PM

## 2019-07-01 ENCOUNTER — Ambulatory Visit: Payer: Medicare Other | Admitting: Cardiovascular Disease

## 2019-07-03 ENCOUNTER — Other Ambulatory Visit: Payer: Self-pay

## 2019-07-03 ENCOUNTER — Ambulatory Visit (INDEPENDENT_AMBULATORY_CARE_PROVIDER_SITE_OTHER): Payer: Medicare HMO | Admitting: Pharmacist Clinician (PhC)/ Clinical Pharmacy Specialist

## 2019-07-03 DIAGNOSIS — Z5181 Encounter for therapeutic drug level monitoring: Secondary | ICD-10-CM | POA: Diagnosis not present

## 2019-07-03 DIAGNOSIS — Z7901 Long term (current) use of anticoagulants: Secondary | ICD-10-CM

## 2019-07-03 DIAGNOSIS — I480 Paroxysmal atrial fibrillation: Secondary | ICD-10-CM | POA: Diagnosis not present

## 2019-07-03 DIAGNOSIS — I4891 Unspecified atrial fibrillation: Secondary | ICD-10-CM | POA: Diagnosis not present

## 2019-07-03 LAB — POCT INR: INR: 1.5 — AB (ref 2.0–3.0)

## 2019-07-05 ENCOUNTER — Other Ambulatory Visit (HOSPITAL_COMMUNITY): Payer: Self-pay | Admitting: Internal Medicine

## 2019-07-09 DIAGNOSIS — H353211 Exudative age-related macular degeneration, right eye, with active choroidal neovascularization: Secondary | ICD-10-CM | POA: Diagnosis not present

## 2019-07-09 DIAGNOSIS — H353120 Nonexudative age-related macular degeneration, left eye, stage unspecified: Secondary | ICD-10-CM | POA: Diagnosis not present

## 2019-07-09 DIAGNOSIS — Z961 Presence of intraocular lens: Secondary | ICD-10-CM | POA: Diagnosis not present

## 2019-07-13 ENCOUNTER — Ambulatory Visit (HOSPITAL_COMMUNITY)
Admission: RE | Admit: 2019-07-13 | Discharge: 2019-07-13 | Disposition: A | Discharge status: Home / Self Care | Payer: Medicare HMO | Source: Ambulatory Visit | Attending: Internal Medicine | Admitting: Internal Medicine

## 2019-07-13 ENCOUNTER — Other Ambulatory Visit (HOSPITAL_COMMUNITY): Payer: Self-pay | Admitting: *Deleted

## 2019-07-13 ENCOUNTER — Other Ambulatory Visit: Payer: Self-pay

## 2019-07-13 ENCOUNTER — Encounter (HOSPITAL_COMMUNITY): Payer: Self-pay | Admitting: Internal Medicine

## 2019-07-13 VITALS — BP 90/54 | HR 83 | Wt 132.4 lb

## 2019-07-13 DIAGNOSIS — Z7951 Long term (current) use of inhaled steroids: Secondary | ICD-10-CM | POA: Insufficient documentation

## 2019-07-13 DIAGNOSIS — I255 Ischemic cardiomyopathy: Secondary | ICD-10-CM | POA: Insufficient documentation

## 2019-07-13 DIAGNOSIS — Z7901 Long term (current) use of anticoagulants: Secondary | ICD-10-CM | POA: Diagnosis not present

## 2019-07-13 DIAGNOSIS — I340 Nonrheumatic mitral (valve) insufficiency: Secondary | ICD-10-CM

## 2019-07-13 DIAGNOSIS — I2510 Atherosclerotic heart disease of native coronary artery without angina pectoris: Secondary | ICD-10-CM | POA: Diagnosis not present

## 2019-07-13 DIAGNOSIS — Z8249 Family history of ischemic heart disease and other diseases of the circulatory system: Secondary | ICD-10-CM | POA: Insufficient documentation

## 2019-07-13 DIAGNOSIS — Z880 Allergy status to penicillin: Secondary | ICD-10-CM | POA: Diagnosis not present

## 2019-07-13 DIAGNOSIS — Z79899 Other long term (current) drug therapy: Secondary | ICD-10-CM | POA: Insufficient documentation

## 2019-07-13 DIAGNOSIS — I447 Left bundle-branch block, unspecified: Secondary | ICD-10-CM | POA: Diagnosis not present

## 2019-07-13 DIAGNOSIS — Z951 Presence of aortocoronary bypass graft: Secondary | ICD-10-CM | POA: Diagnosis not present

## 2019-07-13 DIAGNOSIS — I480 Paroxysmal atrial fibrillation: Secondary | ICD-10-CM

## 2019-07-13 DIAGNOSIS — Z95810 Presence of automatic (implantable) cardiac defibrillator: Secondary | ICD-10-CM | POA: Diagnosis not present

## 2019-07-13 DIAGNOSIS — K219 Gastro-esophageal reflux disease without esophagitis: Secondary | ICD-10-CM | POA: Diagnosis not present

## 2019-07-13 DIAGNOSIS — Z7984 Long term (current) use of oral hypoglycemic drugs: Secondary | ICD-10-CM | POA: Diagnosis not present

## 2019-07-13 DIAGNOSIS — I252 Old myocardial infarction: Secondary | ICD-10-CM | POA: Diagnosis not present

## 2019-07-13 DIAGNOSIS — I5022 Chronic systolic (congestive) heart failure: Secondary | ICD-10-CM

## 2019-07-13 LAB — BASIC METABOLIC PANEL
Anion gap: 8 (ref 5–15)
BUN: 14 mg/dL (ref 8–23)
CO2: 27 mmol/L (ref 22–32)
Calcium: 9.1 mg/dL (ref 8.9–10.3)
Chloride: 100 mmol/L (ref 98–111)
Creatinine, Ser: 0.82 mg/dL (ref 0.61–1.24)
GFR calc Af Amer: >60 mL/min (ref >60)
GFR calc non Af Amer: >60 mL/min (ref >60)
Glucose, Bld: 98 mg/dL (ref 70–99)
Potassium: 3.8 mmol/L (ref 3.5–5.1)
Sodium: 135 mmol/L (ref 135–145)

## 2019-07-13 LAB — CBC
HCT: 44.6 % (ref 39.0–52.0)
Hemoglobin: 14 g/dL (ref 13.0–17.0)
MCH: 28.6 pg (ref 26.0–34.0)
MCHC: 31.4 g/dL (ref 30.0–36.0)
MCV: 91 fL (ref 80.0–100.0)
Platelets: 252 10*3/uL (ref 150–400)
RBC: 4.9 MIL/uL (ref 4.22–5.81)
RDW: 15.3 % (ref 11.5–15.5)
WBC: 7.7 10*3/uL (ref 4.0–10.5)
nRBC: 0 % (ref 0.0–0.2)

## 2019-07-13 LAB — BRAIN NATRIURETIC PEPTIDE: B Natriuretic Peptide: 655.9 pg/mL — ABNORMAL HIGH (ref 0.0–100.0)

## 2019-07-13 MED ORDER — TORSEMIDE 20 MG PO TABS: 20.0000 mg | ORAL_TABLET | ORAL | 3 refills | Status: DC

## 2019-07-13 MED ORDER — POTASSIUM CHLORIDE ER 10 MEQ PO TBCR: 5.0000 meq | EXTENDED_RELEASE_TABLET | ORAL | 3 refills | Status: DC

## 2019-07-13 NOTE — Patient Instructions (Addendum)
Decrease Torsemide and Potassium to only on Monday, Wednesday, and Friday. You can take extra as needed  Labs done today, your results will be available in MyChart, we will contact you for abnormal readings.  Your physician has requested that you have a TEE. During a TEE, sound waves are used to create images of your heart. It provides your doctor with information about the size and shape of your heart and how well your heart's chambers and valves are working. In this test, a transducer is attached to the end of a flexible tube that's guided down your throat and into your esophagus (the tube leading from you mouth to your stomach) to get a more detailed image of your heart. You are not awake for the procedure. Please see the instruction sheet given to you today. For further information please visit [email or web address].  Your physician recommends that you schedule a follow-up appointment in: 3 months   TEE Instructions:  You are scheduled for a TEE on Tue 07/21/19 with Dr. [name].  Please arrive at the [facility] (Main Entrance A) at [facility]: [address] [place], [place] [address] at 6:30 am.   DIET: Nothing to eat or drink after midnight except a sip of water with medications (see medication instructions below)  Medication Instructions: Hold morning medications  Continue your anticoagulant: Coumadin   COVID TEST: Monday 07/20/19 at 9:10 AM   You must have a responsible person to drive you home and stay in the waiting area during your procedure. Failure to do so could result in cancellation.  Bring your insurance cards.  *Special Note: Every effort is made to have your procedure done on time. Occasionally there are emergencies that occur at the hospital that may cause delays. Please be patient if a delay does occur.

## 2019-07-13 NOTE — Progress Notes (Signed)
Patient ID: [name], male   DOB: 1941-01-15, 79 y.o.   MRN: [id number]    Advanced Heart Failure Clinic Note   Date:  07/13/2019   ID:  [name], [name] August 22, 1940, MRN [id number]  PCP:  [name], MD  Primary Cardiologist:  [name] Referring: Dr. [name]   History of Present Illness: [name] is a 79 y.o. 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 In September 1998 he underwent CABG revascularization surgery after an unsuccessful attempt at stenting of his proximal LAD by [name]. Ejection fraction was 25-30%. In 2002 he underwent initial ICD implantation for nonsustained ventricular tachycardia documented on event monitor for primary prevention. In January 2010 he underwent generator change with a Medtronic Virtuoso single chamber cardioverter defibrillator.  A nuclear perfusion study in November 2013 showed a large area of scar in the LAD territory (extent 44%) involving the mid to apical anterior,apical and infero-apical to mid infero-septal and apical lateral wall without associated ischemia.  He is on Coumadin anticoagulation for PAF. He was readmitted to the hospital from a May 2 through 06/04/2014 with recurrent atrial fibrillation and started on Tikosyn. He was enrolled in the genetic AF trial (bucindolol vs Toprol). He did poorly in the trial in the setting of titration of beta-blocker. He was referred to HF Clinic. Genetic AF study completed for pt. He is now on bisoprol 2.5 mg daily.   He has been discussing CRT upgrade with Dr. 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    He presents today for follow up. Doing well. Remains active.Gets 5-6k steps/day. Says he is feeling pretty good. Exercising on TM. No significant SOB. No CP. At last visit we started [place] and got CPX.  Occasionally lightheaded.   ICD  interrogated: No AT/AF 100% Biv pacing. Optivol flat Personally reviewed  Echo 4/21 EF 20% LV markedly dilated. Severe MR  RV moderately reduced   CPX 5/21  FVC 3.17 (89%)    FEV1 2.35 (88%)     FEV1/FVC 74 (98%)     MVV 71 (66%)   Resting HR: 69 Standing HR: 72 Peak HR: 105  (74% age predicted max HR)  BP rest: 104/58 Standing BP: 90/56 BP peak: 112/60  Peak VO2: 15.5 (66% predicted peak VO2)  VE/VCO2 slope: 43  OUES: 1.12  Peak RER: 1.11   Ventilatory Threshold: 13.6 (57% predicted or measured peak VO2)  VE/MVV: 71%  O2pulse: 9  (90% predicted O2pulse)   Echo 04/04/17 EF 20% with mild to mod reduction RV function.   CPX 02/17/16 Resting HR: 73 Peak HR: 108  (75% age predicted max HR) BP rest: 110/58 BP peak: 150/56 Peak VO2: 14.4 (58% predicted peak VO2) VE/VCO2 slope: 33 OUES: 1.32 Peak RER: 1.10 PETCO2 at peak: 33 O2pulse: 9  (82% predicted O2pulse)  Echo 12/17 EF 15-20%, Moderate MR, RV with reduced function Echo 5/17 EF 20% RV mildly down.  Echo 3/19: EF 20%, RV mild to moderate reduced function    Labs 10/04/15 K 5.3, Creatinine 0.85, BUN 15  Past Medical History:  Diagnosis Date  . Acute on chronic systolic CHF (congestive heart failure) ([place]) 06/09/2015  . 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 Cardiomyopathy, ischemic 2011   with EF 25-35% by echo  . Coronary  artery disease    Hx MI 57, CABG 1998 , Nuc study 11.2013 large scar but no ischemia  . GERD (gastroesophageal reflux disease)   . H. pylori infection    Hx of   . 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 NSVT (nonsustained ventricular tachycardia) ([place])   . Paroxysmal atrial fibrillation ([facility])     Current Outpatient Medications  Medication Sig Dispense Refill  . acetaminophen (TYLENOL) 650 MG CR tablet Take 650 mg by mouth 2 (two) times daily.     Marland Kitchen  allopurinol (ZYLOPRIM) 300 MG tablet Take 300 mg by mouth daily.    . bisoprolol (ZEBETA) 5 MG tablet Take 0.5 tablets (2.5 mg total) by mouth daily. 45 tablet 3  . cetirizine (ZYRTEC) 10 MG tablet Take 10 mg by mouth daily.      . Cholecalciferol (VITAMIN D) 2000 units CAPS Take 2,000-4,000 capsules by mouth daily.     . dapagliflozin propanediol (FARXIGA) 10 MG TABS tablet Take 10 mg by mouth daily before breakfast. 30 tablet 6  . dicyclomine (BENTYL) 10 MG capsule Take 1 capsule (10 mg total) by mouth 3 (three) times daily as needed for spasms. 270 capsule 1  . hyoscyamine (LEVSIN SL) 0.125 MG SL tablet Place 1 tablet (0.125 mg total) under the tongue every 4 (four) hours as needed. 15 tablet 0  . isosorbide mononitrate (IMDUR) 30 MG 24 hr tablet Take 0.5 tablets (15 mg total) by mouth daily. 45 tablet 3  . LORazepam (ATIVAN) 1 MG tablet Take 0.25-0.5 mg by mouth at bedtime.     Marland Kitchen losartan (COZAAR) 25 MG tablet TAKE ONE TABLET BY MOUTH TWICE A DAY 180 tablet 3  . magnesium oxide (MAG-OX) 400 MG tablet Take 400 mg by mouth daily at 12 noon.     . mometasone (ASMANEX) 220 MCG/INH inhaler Inhale 1 puff into the lungs daily.     . montelukast (SINGULAIR) 10 MG tablet Take 1 tablet (10 mg total) by mouth at bedtime. PLEASE SCHEDULE APPOINTMENT. 30 tablet 0  . omeprazole (PRILOSEC) 20 MG capsule TAKE 1 CAPSULE BY MOUTH EVERY DAY IN THE MORNING 90 capsule 1  . Polyethyl Glycol-Propyl Glycol (SYSTANE OP) Apply 2-3 drops to eye 3 (three) times daily as needed (dry eyes).    . potassium chloride (KLOR-CON) 10 MEQ tablet Take 0.5 tablets (5 mEq total) by mouth daily. 45 tablet 3  . simvastatin (ZOCOR) 40 MG tablet TAKE 1 TABLET BY MOUTH AT BEDTIME 90 tablet 0  . spironolactone (ALDACTONE) 25 MG tablet Take 1 tablet (25 mg total) by mouth daily. 90 tablet 3  . tamsulosin (FLOMAX) 0.4 MG CAPS capsule Take 0.4 mg by mouth daily.    Marland Kitchen torsemide (DEMADEX) 20 MG tablet Take 1 tablet (20 mg total) by mouth  daily. 90 tablet 3  . warfarin (COUMADIN) 5 MG tablet Take 1/2 to 1 tablet by mouth daily as directed 90 tablet 1   No current facility-administered medications for this encounter.    Allergies:    Allergies  Allergen Reactions  . Amoxicillin Rash    Has patient had a PCN reaction causing immediate rash, facial/tongue/throat swelling, SOB or lightheadedness with hypotension: Yes Has patient had a PCN reaction causing severe rash involving mucus membranes or skin necrosis: No Has patient had a PCN reaction that required hospitalization: No Has patient had a PCN reaction occurring within the last 10 years: No If all of the above  answers are "NO", then may proceed with Cephalosporin use.   Marland Kitchen Penicillins Rash    Has patient had a PCN reaction causing immediate rash, facial/tongue/throat swelling, SOB or lightheadedness with hypotension: Yes Has patient had a PCN reaction causing severe rash involving mucus membranes or skin necrosis: No Has patient had a PCN reaction that required hospitalization: No Has patient had a PCN reaction occurring within the last 10 years: No If all of the above answers are "NO", then may proceed with Cephalosporin use.     Social History:  The patient  reports that he has never smoked. He has never used smokeless tobacco. He reports current alcohol use of about 2.0 standard drinks of alcohol per week. He reports that he does not use drugs.   Family history:   Family History  Problem Relation Age of Onset  . Heart disease Father        questionable  . Stroke Mother   . Heart failure Sister   . Healthy Brother   . Healthy Sister   . Parkinsonism Brother   . Colon cancer Neg Hx    Review of systems complete and found to be negative unless listed in HPI.    PHYSICAL EXAM: Vitals:   07/13/19 1049  BP: (!) 90/54  Pulse: 83  SpO2: 97%  Weight: 60.1 kg (132 lb 6.4 oz)   Wt Readings from Last 3 Encounters:  07/13/19 60.1 kg (132 lb 6.4 oz)  05/18/19  62.5 kg (137 lb 12.8 oz)  02/11/19 64 kg (141 lb)   General:  Well appearing. No resp difficulty HEENT: normal Neck: supple. no JVD. Carotids 2+ bilat; no bruits. No lymphadenopathy or thryomegaly appreciated. Cor: PMI nondisplaced. Regular rate & rhythm. No rubs, gallops 2/6 MR Lungs: clear Abdomen: soft, nontender, nondistended. No hepatosplenomegaly. No bruits or masses. Good bowel sounds. Extremities: no cyanosis, clubbing, rash, edema Neuro: alert & orientedx3, cranial nerves grossly intact. moves all 4 extremities w/o difficulty. Affect pleasant   ASSESSMENT AND PLAN:  1. Chronic systolic HF due to iCM, Echo 12/2015 EF 20, Echo 3/19 EF 20% - CPX from 1/18 shows moderate HF limitation - Stable NYHA II symptoms s/p CRT device despite severe biV dysfunction - Echo 05/18/19 EF 20% with moderate RV dysfunction. LV markedly dilated LVIDd 7.0 cm Severe MR, mod AI, mod TR.  - CPX from 5/21 reviewed with him and his wife. PVO2 improved since 2018 but VEVCO2 elevated suggestive of high pulmonary pressures with exercise - Rash after taking Entresto.  - Continue losartan 25 bid - Continue Farxiga 10 - Continue spiro 25 mg daily - Continue bisoprolol 2.5 mg daily. Unable to tolerate higher.  - Volume status is low after addition of [place]. Drop torsemide to 20mg  MWF. Can take extra as needed - Long talk about his options today. Not candidate for transplant with age. Given RV dysfunction and age I also don't think he is a VAD candidate. With severity of MR and elevated VEVCO2 slope on CPX may benefit from MitraClip to avert further remodeling of LV. I reviewed with Dr. Burt Knack who will discuss with Structural Team in am. There is concern that LV may be too dilated to benefit - ICD interrogated personally in clinic  2. PAF - Maintaining NSR on exam and ICD - Continue coumadin. Does not want Eliquis due to cost - No bleeding   3. CAD s/p previous anterior infarct - No s/s ischemia - Off ASA  due to warfarin. Continue statin. - Recent  LDL 76 in 1/19  4. LBBB - Now s/p CRT. 100% biv pacing on device  Total time spent 35 minutes. Over half that time spent discussing above.    Glori Bickers, MD  11:19 AM

## 2019-07-13 NOTE — H&P (View-Only) (Signed)
Patient ID: [name], male   DOB: Aug 07, 1940, 79 y.o.   MRN: [id number]    Advanced Heart Failure Clinic Note   Date:  07/13/2019   ID:  [name], [name] [birth year], MRN [id number]  PCP:  [name], MD  Primary Cardiologist:  [name] Referring: Dr. [name]   History of Present Illness: [name] is a 79 y.o. 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 In September 1998 he underwent CABG revascularization surgery after an unsuccessful attempt at stenting of his proximal LAD by [name]. Ejection fraction was 25-30%. In 2002 he underwent initial ICD implantation for nonsustained ventricular tachycardia documented on event monitor for primary prevention. In January 2010 he underwent generator change with a Medtronic Virtuoso single chamber cardioverter defibrillator.  A nuclear perfusion study in November 2013 showed a large area of scar in the LAD territory (extent 44%) involving the mid to apical anterior,apical and infero-apical to mid infero-septal and apical lateral wall without associated ischemia.  He is on Coumadin anticoagulation for PAF. He was readmitted to the hospital from a May 2 through 06/04/2014 with recurrent atrial fibrillation and started on Tikosyn. He was enrolled in the genetic AF trial (bucindolol vs Toprol). He did poorly in the trial in the setting of titration of beta-blocker. He was referred to HF Clinic. Genetic AF study completed for pt. He is now on bisoprol 2.5 mg daily.   He has been discussing CRT upgrade with Dr. 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    He presents today for follow up. Doing well. Remains active.Gets 5-6k steps/day. Says he is feeling pretty good. Exercising on TM. No significant SOB. No CP. At last visit we started [place] and got CPX.  Occasionally lightheaded.   ICD  interrogated: No AT/AF 100% Biv pacing. Optivol flat Personally reviewed  Echo 4/21 EF 20% LV markedly dilated. Severe MR  RV moderately reduced   CPX 5/21  FVC 3.17 (89%)    FEV1 2.35 (88%)     FEV1/FVC 74 (98%)     MVV 71 (66%)   Resting HR: 69 Standing HR: 72 Peak HR: 105  (74% age predicted max HR)  BP rest: 104/58 Standing BP: 90/56 BP peak: 112/60  Peak VO2: 15.5 (66% predicted peak VO2)  VE/VCO2 slope: 43  OUES: 1.12  Peak RER: 1.11   Ventilatory Threshold: 13.6 (57% predicted or measured peak VO2)  VE/MVV: 71%  O2pulse: 9  (90% predicted O2pulse)   Echo 04/04/17 EF 20% with mild to mod reduction RV function.   CPX 02/17/16 Resting HR: 73 Peak HR: 108  (75% age predicted max HR) BP rest: 110/58 BP peak: 150/56 Peak VO2: 14.4 (58% predicted peak VO2) VE/VCO2 slope: 33 OUES: 1.32 Peak RER: 1.10 PETCO2 at peak: 33 O2pulse: 9  (82% predicted O2pulse)  Echo 12/17 EF 15-20%, Moderate MR, RV with reduced function Echo 5/17 EF 20% RV mildly down.  Echo 3/19: EF 20%, RV mild to moderate reduced function    Labs 10/04/15 K 5.3, Creatinine 0.85, BUN 15  Past Medical History:  Diagnosis Date  . Acute on chronic systolic CHF (congestive heart failure) ([place]) 06/09/2015  . 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 Cardiomyopathy, ischemic 2011   with EF 25-35% by echo  . Coronary  artery disease    Hx MI 33, CABG 1998 , Nuc study 11.2013 large scar but no ischemia  . GERD (gastroesophageal reflux disease)   . H. pylori infection    Hx of   . 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 NSVT (nonsustained ventricular tachycardia) ([place])   . Paroxysmal atrial fibrillation ([facility])     Current Outpatient Medications  Medication Sig Dispense Refill  . acetaminophen (TYLENOL) 650 MG CR tablet Take 650 mg by mouth 2 (two) times daily.     Marland Kitchen  allopurinol (ZYLOPRIM) 300 MG tablet Take 300 mg by mouth daily.    . bisoprolol (ZEBETA) 5 MG tablet Take 0.5 tablets (2.5 mg total) by mouth daily. 45 tablet 3  . cetirizine (ZYRTEC) 10 MG tablet Take 10 mg by mouth daily.      . Cholecalciferol (VITAMIN D) 2000 units CAPS Take 2,000-4,000 capsules by mouth daily.     . dapagliflozin propanediol (FARXIGA) 10 MG TABS tablet Take 10 mg by mouth daily before breakfast. 30 tablet 6  . dicyclomine (BENTYL) 10 MG capsule Take 1 capsule (10 mg total) by mouth 3 (three) times daily as needed for spasms. 270 capsule 1  . hyoscyamine (LEVSIN SL) 0.125 MG SL tablet Place 1 tablet (0.125 mg total) under the tongue every 4 (four) hours as needed. 15 tablet 0  . isosorbide mononitrate (IMDUR) 30 MG 24 hr tablet Take 0.5 tablets (15 mg total) by mouth daily. 45 tablet 3  . LORazepam (ATIVAN) 1 MG tablet Take 0.25-0.5 mg by mouth at bedtime.     Marland Kitchen losartan (COZAAR) 25 MG tablet TAKE ONE TABLET BY MOUTH TWICE A DAY 180 tablet 3  . magnesium oxide (MAG-OX) 400 MG tablet Take 400 mg by mouth daily at 12 noon.     . mometasone (ASMANEX) 220 MCG/INH inhaler Inhale 1 puff into the lungs daily.     . montelukast (SINGULAIR) 10 MG tablet Take 1 tablet (10 mg total) by mouth at bedtime. PLEASE SCHEDULE APPOINTMENT. 30 tablet 0  . omeprazole (PRILOSEC) 20 MG capsule TAKE 1 CAPSULE BY MOUTH EVERY DAY IN THE MORNING 90 capsule 1  . Polyethyl Glycol-Propyl Glycol (SYSTANE OP) Apply 2-3 drops to eye 3 (three) times daily as needed (dry eyes).    . potassium chloride (KLOR-CON) 10 MEQ tablet Take 0.5 tablets (5 mEq total) by mouth daily. 45 tablet 3  . simvastatin (ZOCOR) 40 MG tablet TAKE 1 TABLET BY MOUTH AT BEDTIME 90 tablet 0  . spironolactone (ALDACTONE) 25 MG tablet Take 1 tablet (25 mg total) by mouth daily. 90 tablet 3  . tamsulosin (FLOMAX) 0.4 MG CAPS capsule Take 0.4 mg by mouth daily.    Marland Kitchen torsemide (DEMADEX) 20 MG tablet Take 1 tablet (20 mg total) by mouth  daily. 90 tablet 3  . warfarin (COUMADIN) 5 MG tablet Take 1/2 to 1 tablet by mouth daily as directed 90 tablet 1   No current facility-administered medications for this encounter.    Allergies:    Allergies  Allergen Reactions  . Amoxicillin Rash    Has patient had a PCN reaction causing immediate rash, facial/tongue/throat swelling, SOB or lightheadedness with hypotension: Yes Has patient had a PCN reaction causing severe rash involving mucus membranes or skin necrosis: No Has patient had a PCN reaction that required hospitalization: No Has patient had a PCN reaction occurring within the last 10 years: No If all of the above  answers are "NO", then may proceed with Cephalosporin use.   Marland Kitchen Penicillins Rash    Has patient had a PCN reaction causing immediate rash, facial/tongue/throat swelling, SOB or lightheadedness with hypotension: Yes Has patient had a PCN reaction causing severe rash involving mucus membranes or skin necrosis: No Has patient had a PCN reaction that required hospitalization: No Has patient had a PCN reaction occurring within the last 10 years: No If all of the above answers are "NO", then may proceed with Cephalosporin use.     Social History:  The patient  reports that he has never smoked. He has never used smokeless tobacco. He reports current alcohol use of about 2.0 standard drinks of alcohol per week. He reports that he does not use drugs.   Family history:   Family History  Problem Relation Age of Onset  . Heart disease Father        questionable  . Stroke Mother   . Heart failure Sister   . Healthy Brother   . Healthy Sister   . Parkinsonism Brother   . Colon cancer Neg Hx    Review of systems complete and found to be negative unless listed in HPI.    PHYSICAL EXAM: Vitals:   07/13/19 1049  BP: (!) 90/54  Pulse: 83  SpO2: 97%  Weight: 60.1 kg (132 lb 6.4 oz)   Wt Readings from Last 3 Encounters:  07/13/19 60.1 kg (132 lb 6.4 oz)  05/18/19  62.5 kg (137 lb 12.8 oz)  02/11/19 64 kg (141 lb)   General:  Well appearing. No resp difficulty HEENT: normal Neck: supple. no JVD. Carotids 2+ bilat; no bruits. No lymphadenopathy or thryomegaly appreciated. Cor: PMI nondisplaced. Regular rate & rhythm. No rubs, gallops 2/6 MR Lungs: clear Abdomen: soft, nontender, nondistended. No hepatosplenomegaly. No bruits or masses. Good bowel sounds. Extremities: no cyanosis, clubbing, rash, edema Neuro: alert & orientedx3, cranial nerves grossly intact. moves all 4 extremities w/o difficulty. Affect pleasant   ASSESSMENT AND PLAN:  1. Chronic systolic HF due to iCM, Echo 12/2015 EF 20, Echo 3/19 EF 20% - CPX from 1/18 shows moderate HF limitation - Stable NYHA II symptoms s/p CRT device despite severe biV dysfunction - Echo 05/18/19 EF 20% with moderate RV dysfunction. LV markedly dilated LVIDd 7.0 cm Severe MR, mod AI, mod TR.  - CPX from 5/21 reviewed with him and his wife. PVO2 improved since 2018 but VEVCO2 elevated suggestive of high pulmonary pressures with exercise - Rash after taking Entresto.  - Continue losartan 25 bid - Continue Farxiga 10 - Continue spiro 25 mg daily - Continue bisoprolol 2.5 mg daily. Unable to tolerate higher.  - Volume status is low after addition of [place]. Drop torsemide to 20mg  MWF. Can take extra as needed - Long talk about his options today. Not candidate for transplant with age. Given RV dysfunction and age I also don't think he is a VAD candidate. With severity of MR and elevated VEVCO2 slope on CPX may benefit from MitraClip to avert further remodeling of LV. I reviewed with Dr. Burt Knack who will discuss with Structural Team in am. There is concern that LV may be too dilated to benefit - ICD interrogated personally in clinic  2. PAF - Maintaining NSR on exam and ICD - Continue coumadin. Does not want Eliquis due to cost - No bleeding   3. CAD s/p previous anterior infarct - No s/s ischemia - Off ASA  due to warfarin. Continue statin. - Recent  LDL 76 in 1/19  4. LBBB - Now s/p CRT. 100% biv pacing on device  Total time spent 35 minutes. Over half that time spent discussing above.    Glori Bickers, MD  11:19 AM

## 2019-07-15 ENCOUNTER — Other Ambulatory Visit: Payer: Self-pay | Admitting: Cardiovascular Disease

## 2019-07-15 MED ORDER — SIMVASTATIN 40 MG PO TABS: 40.0000 mg | ORAL_TABLET | Freq: Every day | ORAL | 1 refills | Status: DC

## 2019-07-15 NOTE — Telephone Encounter (Signed)
*  STAT* If patient is at the pharmacy, call can be transferred to refill team.   1. Which medications need to be refilled? (please list name of each medication and dose if known) simvastatin (ZOCOR) 40 MG tablet  2. Which pharmacy/location (including street and city if local pharmacy) is medication to be sent to? [place], [place]  3. Do they need a 30 day or 90 day supply? [place]

## 2019-07-20 ENCOUNTER — Other Ambulatory Visit (HOSPITAL_COMMUNITY)
Admission: RE | Admit: 2019-07-20 | Discharge: 2019-07-20 | Disposition: A | Discharge status: Home / Self Care | Payer: Medicare HMO | Source: Ambulatory Visit | Attending: Internal Medicine | Admitting: Internal Medicine

## 2019-07-20 DIAGNOSIS — Z20822 Contact with and (suspected) exposure to covid-19: Secondary | ICD-10-CM | POA: Diagnosis not present

## 2019-07-20 DIAGNOSIS — Z01812 Encounter for preprocedural laboratory examination: Secondary | ICD-10-CM | POA: Insufficient documentation

## 2019-07-20 LAB — SARS CORONAVIRUS 2 (TAT 6-24 HRS): SARS Coronavirus 2: NEGATIVE

## 2019-07-21 ENCOUNTER — Encounter (HOSPITAL_COMMUNITY)
Admission: RE | Disposition: A | Discharge status: Home / Self Care | Payer: Self-pay | Source: Home / Self Care | Attending: Internal Medicine

## 2019-07-21 ENCOUNTER — Other Ambulatory Visit: Payer: Self-pay

## 2019-07-21 ENCOUNTER — Ambulatory Visit (HOSPITAL_COMMUNITY)
Admission: RE | Admit: 2019-07-21 | Discharge: 2019-07-21 | Disposition: A | Discharge status: Home / Self Care | Payer: Medicare HMO | Attending: Internal Medicine | Admitting: Internal Medicine

## 2019-07-21 ENCOUNTER — Ambulatory Visit (HOSPITAL_BASED_OUTPATIENT_CLINIC_OR_DEPARTMENT_OTHER): Payer: Medicare HMO

## 2019-07-21 DIAGNOSIS — I480 Paroxysmal atrial fibrillation: Secondary | ICD-10-CM | POA: Insufficient documentation

## 2019-07-21 DIAGNOSIS — I472 Ventricular tachycardia: Secondary | ICD-10-CM | POA: Insufficient documentation

## 2019-07-21 DIAGNOSIS — I2510 Atherosclerotic heart disease of native coronary artery without angina pectoris: Secondary | ICD-10-CM | POA: Insufficient documentation

## 2019-07-21 DIAGNOSIS — I5022 Chronic systolic (congestive) heart failure: Secondary | ICD-10-CM | POA: Diagnosis not present

## 2019-07-21 DIAGNOSIS — I255 Ischemic cardiomyopathy: Secondary | ICD-10-CM | POA: Insufficient documentation

## 2019-07-21 DIAGNOSIS — Z79899 Other long term (current) drug therapy: Secondary | ICD-10-CM | POA: Insufficient documentation

## 2019-07-21 DIAGNOSIS — I252 Old myocardial infarction: Secondary | ICD-10-CM | POA: Diagnosis not present

## 2019-07-21 DIAGNOSIS — I351 Nonrheumatic aortic (valve) insufficiency: Secondary | ICD-10-CM | POA: Diagnosis not present

## 2019-07-21 DIAGNOSIS — Z7984 Long term (current) use of oral hypoglycemic drugs: Secondary | ICD-10-CM | POA: Insufficient documentation

## 2019-07-21 DIAGNOSIS — Z951 Presence of aortocoronary bypass graft: Secondary | ICD-10-CM | POA: Insufficient documentation

## 2019-07-21 DIAGNOSIS — K219 Gastro-esophageal reflux disease without esophagitis: Secondary | ICD-10-CM | POA: Insufficient documentation

## 2019-07-21 DIAGNOSIS — Z95810 Presence of automatic (implantable) cardiac defibrillator: Secondary | ICD-10-CM | POA: Insufficient documentation

## 2019-07-21 DIAGNOSIS — I340 Nonrheumatic mitral (valve) insufficiency: Secondary | ICD-10-CM

## 2019-07-21 DIAGNOSIS — Z880 Allergy status to penicillin: Secondary | ICD-10-CM | POA: Insufficient documentation

## 2019-07-21 DIAGNOSIS — Z7951 Long term (current) use of inhaled steroids: Secondary | ICD-10-CM | POA: Insufficient documentation

## 2019-07-21 DIAGNOSIS — Z7901 Long term (current) use of anticoagulants: Secondary | ICD-10-CM | POA: Insufficient documentation

## 2019-07-21 DIAGNOSIS — I447 Left bundle-branch block, unspecified: Secondary | ICD-10-CM | POA: Diagnosis not present

## 2019-07-21 DIAGNOSIS — I361 Nonrheumatic tricuspid (valve) insufficiency: Secondary | ICD-10-CM

## 2019-07-21 HISTORY — PX: TEE WITHOUT CARDIOVERSION: SHX5443

## 2019-07-21 SURGERY — ECHOCARDIOGRAM, TRANSESOPHAGEAL
Anesthesia: Moderate Sedation

## 2019-07-21 MED ORDER — MIDAZOLAM HCL (PF) 5 MG/ML IJ SOLN
INTRAMUSCULAR | Status: AC
  Filled 2019-07-21: qty 2

## 2019-07-21 MED ORDER — MIDAZOLAM HCL (PF) 10 MG/2ML IJ SOLN
INTRAMUSCULAR | Status: DC | PRN
  Administered 2019-07-21: 2 mg via INTRAVENOUS
  Administered 2019-07-21: 1 mg via INTRAVENOUS

## 2019-07-21 MED ORDER — SODIUM CHLORIDE 0.9 % IV SOLN
INTRAVENOUS | Status: AC | PRN
  Administered 2019-07-21: 500 mL via INTRAVENOUS

## 2019-07-21 MED ORDER — FENTANYL CITRATE (PF) 100 MCG/2ML IJ SOLN
INTRAMUSCULAR | Status: DC | PRN
  Administered 2019-07-21: 25 ug via INTRAVENOUS
  Administered 2019-07-21: 50 ug via INTRAVENOUS

## 2019-07-21 MED ORDER — FENTANYL CITRATE (PF) 100 MCG/2ML IJ SOLN
INTRAMUSCULAR | Status: AC
  Filled 2019-07-21: qty 2

## 2019-07-21 MED ORDER — BUTAMBEN-TETRACAINE-BENZOCAINE 2-2-14 % EX AERO
INHALATION_SPRAY | CUTANEOUS | Status: DC | PRN
  Administered 2019-07-21: 2 via TOPICAL

## 2019-07-21 MED ORDER — SODIUM CHLORIDE 0.9 % IV SOLN: INTRAVENOUS | Status: DC

## 2019-07-21 NOTE — Interval H&P Note (Signed)
History and Physical Interval Note:  07/21/2019 7:40 AM  [name]  has presented today for surgery, with the diagnosis of MITRAL INSUFFICIENCY.  The various methods of treatment have been discussed with the patient and family. After consideration of risks, benefits and other options for treatment, the patient has consented to  Procedure(s): TRANSESOPHAGEAL ECHOCARDIOGRAM (TEE) (N/A) as a surgical intervention.  The patient's history has been reviewed, patient examined, no change in status, stable for surgery.  I have reviewed the patient's chart and labs.  Questions were answered to the patient's satisfaction.     Lurleen Soltero

## 2019-07-21 NOTE — Discharge Instructions (Signed)
Transesophageal Echocardiogram Transesophageal echocardiogram (TEE) is a test that uses sound waves to take pictures of your heart. TEE is done by passing a flexible tube down the esophagus. The esophagus is the tube that carries food from the throat to the stomach. The pictures give detailed images of your heart. This can help your doctor see if there are problems with your heart. What happens before the procedure? Staying hydrated Follow instructions from your doctor about hydration, which may include:  Up to 3 hours before the procedure - you may continue to drink clear liquids, such as: ? Water. ? Clear fruit juice. ? Black coffee. ? Plain tea.  Eating and drinking Follow instructions from your doctor about eating and drinking, which may include:  8 hours before the procedure - stop eating heavy meals or foods such as meat, fried foods, or fatty foods.  6 hours before the procedure - stop eating light meals or foods, such as toast or cereal.  6 hours before the procedure - stop drinking milk or drinks that contain milk.  3 hours before the procedure - stop drinking clear liquids. General instructions  You will need to take out any dentures or retainers.  Plan to have someone take you home from the hospital or clinic.  If you will be going home right after the procedure, plan to have someone with you for 24 hours.  Ask your doctor about: ? Changing or stopping your normal medicines. This is important if you take diabetes medicines or blood thinners. ? Taking over-the-counter medicines, vitamins, herbs, and supplements. ? Taking medicines such as aspirin and ibuprofen. These medicines can thin your blood. Do not take these medicines unless your doctor tells you to take them. What happens during the procedure?  To lower your risk of infection, your doctors will wash or clean their hands.  An IV will be put into one of your veins.  You will be given a medicine to help you  relax (sedative).  A medicine may be sprayed or gargled. This numbs the back of your throat.  Your blood pressure, heart rate, and breathing will be watched.  You may be asked to lay on your left side.  A bite block will be placed in your mouth. This keeps you from biting the tube.  The tip of the TEE probe will be placed into the back of your mouth.  You will be asked to swallow.  Your doctor will take pictures of your heart.  The probe and bite block will be taken out. The procedure may vary among doctors and hospitals. What happens after the procedure?   Your blood pressure, heart rate, breathing rate, and blood oxygen level will be watched until the medicines you were given have worn off.  When you first wake up, your throat may feel sore and numb. This will get better over time. You will not be allowed to eat or drink until the numbness has gone away.  Do not drive for 24 hours if you were given a medicine to help you relax. Summary  TEE is a test that uses sound waves to take pictures of your heart.  You will be given a medicine to help you relax.  Do not drive for 24 hours if you were given a medicine to help you relax. This information is not intended to replace advice given to you by your health care provider. Make sure you discuss any questions you have with your health care provider. Document Revised:   10/04/2017 Document Reviewed: 04/18/2016 Elsevier Patient Education  2020 Elsevier Inc.  

## 2019-07-21 NOTE — Progress Notes (Signed)
Echocardiogram Echocardiogram Transesophageal has been performed.  Oneal Deputy Albeiro Trompeter 07/21/2019, 8:18 AM

## 2019-07-21 NOTE — CV Procedure (Signed)
TRANSESOPHAGEAL ECHOCARDIOGRAM   NAME:  [name]   MRN: [id number] DOB:  [birth year]   ADMIT DATE: 07/21/2019  INDICATIONS:   PROCEDURE:   Informed consent was obtained prior to the procedure. The risks, benefits and alternatives for the procedure were discussed and the patient comprehended these risks.  Risks include, but are not limited to, cough, sore throat, vomiting, nausea, somnolence, esophageal and stomach trauma or perforation, bleeding, low blood pressure, aspiration, pneumonia, infection, trauma to the teeth and death.    After a procedural time-out, the patient was given 3 mg versed and 75 mcg fentanyl for moderate sedation.  The oropharynx was anesthetized 10 cc of topical 1% viscous lidocaine.  The transesophageal probe was inserted in the esophagus and stomach without difficulty and multiple views were obtained.    COMPLICATIONS:    There were no immediate complications.  FINDINGS:  LEFT VENTRICLE: Markedly dilated. Global HK EF = 20%. Marland Kitchen  RIGHT VENTRICLE: Severe HK + ICD wire  LEFT ATRIUM: Severely dilated  LEFT ATRIAL APPENDAGE: No thrombus.   RIGHT ATRIUM: Dilated + ICD wire  AORTIC VALVE:  Trileaflet. Moderate central AI  MITRAL VALVE:    Mild restriction of P2, 3+ central MR. No flow reversal in PVs  TRICUSPID VALVE: Normal. Moderate TR  PULMONIC VALVE: Grossly normal. Trivial PI  INTERATRIAL SEPTUM: No PFO or ASD.  PERICARDIUM: No effusion  DESCENDING AORTA: Moderate plaque   Malesha Suliman,MD 9:35 AM

## 2019-07-22 ENCOUNTER — Ambulatory Visit (INDEPENDENT_AMBULATORY_CARE_PROVIDER_SITE_OTHER): Payer: Medicare HMO | Admitting: Pharmacist

## 2019-07-22 DIAGNOSIS — Z7901 Long term (current) use of anticoagulants: Secondary | ICD-10-CM

## 2019-07-22 DIAGNOSIS — Z5181 Encounter for therapeutic drug level monitoring: Secondary | ICD-10-CM | POA: Diagnosis not present

## 2019-07-22 DIAGNOSIS — I4891 Unspecified atrial fibrillation: Secondary | ICD-10-CM | POA: Diagnosis not present

## 2019-07-22 DIAGNOSIS — I480 Paroxysmal atrial fibrillation: Secondary | ICD-10-CM

## 2019-07-22 LAB — POCT INR: INR: 1.6 — AB (ref 2.0–3.0)

## 2019-07-22 NOTE — Patient Instructions (Signed)
Vitamin K Foods and Warfarin Warfarin is a blood thinner (anticoagulant). Anticoagulant medicines help prevent the formation of blood clots. These medicines work by decreasing the activity of vitamin K, which promotes normal blood clotting. When you take warfarin, problems can occur from suddenly increasing or decreasing the amount of vitamin K that you eat from one day to the next. Problems may include:  Blood clots.  Bleeding. What general guidelines do I need to follow? To avoid problems when taking warfarin:  Eat a balanced diet that includes: ? Fresh fruits and vegetables. ? Whole grains. ? Low-fat dairy products. ? Lean proteins, such as fish, eggs, and lean cuts of meat.  Keep your intake of vitamin K consistent from day to day. To do this: ? Avoid eating large amounts of vitamin K one day and low amounts of vitamin K the next day. ? If you take a multivitamin that contains vitamin K, be sure to take it every day. ? Know which foods contain vitamin K. Use the lists below to understand serving sizes and the amount of vitamin K in one serving.  Avoid major changes in your diet. If you are going to change your diet, talk with your health care provider before making changes.  Work with a nutrition specialist (dietitian) to develop a meal plan that works best for you.  High vitamin K foods Foods that are high in vitamin K contain more than 100 mcg (micrograms) per serving. These include:  Broccoli (cooked) -  cup has 110 mcg.  Brussels sprouts (cooked) -  cup has 109 mcg.  Greens, beet (cooked) -  cup has 350 mcg.  Greens, collard (cooked) -  cup has 418 mcg.  Greens, turnip (cooked) -  cup has 265 mcg.  Green onions or scallions -  cup has 105 mcg.  Kale (fresh or frozen) -  cup has 531 mcg.  Parsley (raw) - 10 sprigs has 164 mcg.  Spinach (cooked) -  cup has 444 mcg.  Swiss chard (cooked) -  cup has 287 mcg. Moderate vitamin K foods Foods that have a  moderate amount of vitamin K contain 25-100 mcg per serving. These include:  Asparagus (cooked) - 5 spears have 38 mcg.  Black-eyed peas (dried) -  cup has 32 mcg.  Cabbage (cooked) -  cup has 37 mcg.  Kiwi fruit - 1 medium has 31 mcg.  Lettuce - 1 cup has 57-63 mcg.  Okra (frozen) -  cup has 44 mcg.  Prunes (dried) - 5 prunes have 25 mcg.  Watercress (raw) - 1 cup has 85 mcg. Low vitamin K foods Foods low in vitamin K contain less than 25 mcg per serving. These include:  Artichoke - 1 medium has 18 mcg.  Avocado - 1 oz. has 6 mcg.  Blueberries -  cup has 14 mcg.  Cabbage (raw) -  cup has 21 mcg.  Carrots (cooked) -  cup has 11 mcg.  Cauliflower (raw) -  cup has 11 mcg.  Cucumber with peel (raw) -  cup has 9 mcg.  Grapes -  cup has 12 mcg.  Mango - 1 medium has 9 mcg.  Nuts - 1 oz. has 15 mcg.  Pear - 1 medium has 8 mcg.  Peas (cooked) -  cup has 19 mcg.  Pickles - 1 spear has 14 mcg.  Pumpkin seeds - 1 oz. has 13 mcg.  Sauerkraut (canned) -  cup has 16 mcg.  Soybeans (cooked) -  cup has 16 mcg.    Tomato (raw) - 1 medium has 10 mcg.  Tomato sauce -  cup has 17 mcg. Vitamin K-free foods If a food contain less than 5 mcg per serving, it is considered to have no vitamin K. These foods include:  Bread and cereal products.  Cheese.  Eggs.  Fish and shellfish.  Meat and poultry.  Milk and dairy products.  Sunflower seeds. Actual amounts of vitamin K in foods may be different depending on processing. Talk with your dietitian about what foods you can eat and what foods you should avoid. This information is not intended to replace advice given to you by your health care provider. Make sure you discuss any questions you have with your health care provider. Document Revised: 12/28/2016 Document Reviewed: 04/20/2015 Elsevier Patient Education  2020 Elsevier Inc.  

## 2019-07-23 ENCOUNTER — Encounter (HOSPITAL_COMMUNITY): Payer: Self-pay | Admitting: Internal Medicine

## 2019-07-23 ENCOUNTER — Ambulatory Visit (INDEPENDENT_AMBULATORY_CARE_PROVIDER_SITE_OTHER): Payer: Medicare HMO | Admitting: *Deleted

## 2019-07-23 ENCOUNTER — Telehealth: Payer: Self-pay | Admitting: Physician Assistant

## 2019-07-23 DIAGNOSIS — Z95810 Presence of automatic (implantable) cardiac defibrillator: Secondary | ICD-10-CM

## 2019-07-23 LAB — CUP PACEART REMOTE DEVICE CHECK
Battery Remaining Longevity: 55 mo
Battery Voltage: 2.97 V
Brady Statistic AP VP Percent: 93.29 %
Brady Statistic AP VS Percent: 1.59 %
Brady Statistic AS VP Percent: 4.61 %
Brady Statistic AS VS Percent: 0.5 %
Brady Statistic RA Percent Paced: 94.09 %
Brady Statistic RV Percent Paced: 0.35 %
Date Time Interrogation Session: 20210624012505
HighPow Impedance: 47 Ohm
HighPow Impedance: 81 Ohm
Implantable Lead Implant Date: 20010806
Implantable Lead Implant Date: 20180813
Implantable Lead Implant Date: 20180813
Implantable Lead Location: 753858
Implantable Lead Location: 753859
Implantable Lead Location: 753860
Implantable Lead Model: 147
Implantable Lead Model: 5092
Implantable Lead Serial Number: 104581
Implantable Pulse Generator Implant Date: 20180813
Lead Channel Impedance Value: 184.154
Lead Channel Impedance Value: 184.154
Lead Channel Impedance Value: 188.1 Ohm
Lead Channel Impedance Value: 204.14 Ohm
Lead Channel Impedance Value: 204.14 Ohm
Lead Channel Impedance Value: 342 Ohm
Lead Channel Impedance Value: 399 Ohm
Lead Channel Impedance Value: 399 Ohm
Lead Channel Impedance Value: 418 Ohm
Lead Channel Impedance Value: 513 Ohm
Lead Channel Impedance Value: 646 Ohm
Lead Channel Impedance Value: 646 Ohm
Lead Channel Impedance Value: 646 Ohm
Lead Channel Impedance Value: 703 Ohm
Lead Channel Impedance Value: 703 Ohm
Lead Channel Impedance Value: 703 Ohm
Lead Channel Impedance Value: 703 Ohm
Lead Channel Impedance Value: 760 Ohm
Lead Channel Pacing Threshold Amplitude: 0.625 V
Lead Channel Pacing Threshold Amplitude: 1.375 V
Lead Channel Pacing Threshold Amplitude: 1.625 V
Lead Channel Pacing Threshold Pulse Width: 0.4 ms
Lead Channel Pacing Threshold Pulse Width: 0.4 ms
Lead Channel Pacing Threshold Pulse Width: 0.4 ms
Lead Channel Sensing Intrinsic Amplitude: 10 mV
Lead Channel Sensing Intrinsic Amplitude: 10 mV
Lead Channel Sensing Intrinsic Amplitude: 2.375 mV
Lead Channel Sensing Intrinsic Amplitude: 2.375 mV
Lead Channel Setting Pacing Amplitude: 1.75 V
Lead Channel Setting Pacing Amplitude: 2 V
Lead Channel Setting Pacing Amplitude: 3.25 V
Lead Channel Setting Pacing Pulse Width: 0.4 ms
Lead Channel Setting Pacing Pulse Width: 0.4 ms
Lead Channel Setting Sensing Sensitivity: 0.3 mV

## 2019-07-23 NOTE — Telephone Encounter (Signed)
Mr. Mccaughey and his wife are calling because that they want to know the results of the TEE and the plan.  I explained that his heart was weak and there were no clots.  His valve is leaking.  His wife remembers being told that Dr. [name] was going to discuss with Dr. Burt Knack the possibility of working on his mitral valve.  I explained that there were no notes in the chart from Dr. [name] that indicate he has had this conversation.  There are no notes from Dr. Burt Knack.  I will route this to Dr. [name].  Rosaria Ferries, PA-C 07/23/2019 6:21 PM

## 2019-07-24 NOTE — Telephone Encounter (Signed)
I spoke with Dr Burt Knack and he would like to see the pt in clinic to discuss MitraClip.  I have contacted the pt and arranged evaluation on 7/2 at 9:00 AM.

## 2019-07-24 NOTE — Progress Notes (Signed)
Remote ICD transmission.   

## 2019-07-31 ENCOUNTER — Ambulatory Visit (INDEPENDENT_AMBULATORY_CARE_PROVIDER_SITE_OTHER): Payer: Medicare HMO

## 2019-07-31 ENCOUNTER — Other Ambulatory Visit: Payer: Self-pay

## 2019-07-31 ENCOUNTER — Telehealth: Payer: Self-pay

## 2019-07-31 ENCOUNTER — Institutional Professional Consult (permissible substitution): Payer: Medicare HMO | Admitting: Cardiovascular Disease

## 2019-07-31 ENCOUNTER — Ambulatory Visit: Payer: Medicare HMO | Admitting: Cardiovascular Disease

## 2019-07-31 ENCOUNTER — Encounter: Payer: Self-pay | Admitting: Cardiovascular Disease

## 2019-07-31 VITALS — BP 96/60 | HR 67 | Ht 67.0 in | Wt 133.0 lb

## 2019-07-31 DIAGNOSIS — I5022 Chronic systolic (congestive) heart failure: Secondary | ICD-10-CM

## 2019-07-31 DIAGNOSIS — I340 Nonrheumatic mitral (valve) insufficiency: Secondary | ICD-10-CM

## 2019-07-31 DIAGNOSIS — Z95810 Presence of automatic (implantable) cardiac defibrillator: Secondary | ICD-10-CM

## 2019-07-31 NOTE — Progress Notes (Signed)
[facility]   MULTIDISCIPLINARY HEART VALVE TEAM  Date:  07/31/2019   ID:  [name], DOB [birth year], MRN [id number]  PCP:  [name], MD   Chief Complaint  Patient presents with  . Mitral Regurgitation     HISTORY OF PRESENT ILLNESS: [name] is a 79 y.o. male who presents for evaluation of severe mitral regurgitation, referred by Dr [name].  The patient is here with his wife today. He has a long cardiac history with his initial MI in 1992, and ultimately required emergency multivessel CABG in 1998 by Dr Prescott Gum. He has been active most of his life, playing sports and even play tennis until about 5 years ago. At that time he was hospitalized with atrial fibrillation with RVR and congestive heart failure. He has been followed by Dr [name] closely since that time.   He reports mild fatigue with walking up a hill but denies any shortness of breath. He has slept on 2 pillows for many years and this is unchanged. He has no exertional chest pain or pressure, orthopnea, PND, or leg swelling. He admits to occasional shortness of breath when he does heavier exertional than normal. No lightheadedness or syncope. The patient's wife feels like he is feeling as well as he's felt in several years.  He is averaging about 5000 steps per day.  The patient has had regular dental care and reports no problems.  Past Medical History:  Diagnosis Date  . Acute on chronic systolic CHF (congestive heart failure) ([place]) 06/09/2015  . 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 Cardiomyopathy, ischemic 2011   with EF 25-35% by echo  . Coronary artery disease    Hx MI 1992, CABG 1998 , Nuc study 11.2013 large scar but no ischemia  . GERD (gastroesophageal reflux disease)   . H. pylori infection    Hx of   . 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 NSVT (nonsustained ventricular tachycardia)  ([place])   . Paroxysmal atrial fibrillation ([facility])     Current Outpatient Medications  Medication Sig Dispense Refill  . acetaminophen (TYLENOL) 650 MG CR tablet Take 650 mg by mouth 2 (two) times daily.     Marland Kitchen allopurinol (ZYLOPRIM) 300 MG tablet Take 300 mg by mouth daily.    . bisoprolol (ZEBETA) 5 MG tablet Take 0.5 tablets (2.5 mg total) by mouth daily. 45 tablet 3  . cetirizine (ZYRTEC) 10 MG tablet Take 10 mg by mouth daily.      . Cholecalciferol (VITAMIN D) 2000 units CAPS Take 2,000-4,000 capsules by mouth See admin instructions. 4000 units MWF, 2000 units all other days    . dapagliflozin propanediol (FARXIGA) 10 MG TABS tablet Take 10 mg by mouth daily before breakfast. 30 tablet 6  . dicyclomine (BENTYL) 10 MG capsule Take 1 capsule (10 mg total) by mouth 3 (three) times daily as needed for spasms. 270 capsule 1  . hyoscyamine (LEVSIN SL) 0.125 MG SL tablet Place 1 tablet (0.125 mg total) under the tongue every 4 (four) hours as needed. 15 tablet 0  . isosorbide mononitrate (IMDUR) 30 MG 24 hr tablet Take 0.5 tablets (15 mg total) by mouth daily. 45 tablet 3  . LORazepam (ATIVAN) 1 MG tablet Take 0.25-0.5 mg by mouth at bedtime.     Marland Kitchen losartan (COZAAR) 25 MG tablet TAKE ONE TABLET BY MOUTH TWICE A DAY 180  tablet 3  . magnesium oxide (MAG-OX) 400 MG tablet Take 400 mg by mouth daily at 12 noon.     . mometasone (ASMANEX) 220 MCG/INH inhaler Inhale 1 puff into the lungs daily.     . montelukast (SINGULAIR) 10 MG tablet Take 1 tablet (10 mg total) by mouth at bedtime. PLEASE SCHEDULE APPOINTMENT. 30 tablet 0  . omeprazole (PRILOSEC) 20 MG capsule TAKE 1 CAPSULE BY MOUTH EVERY DAY IN THE MORNING 90 capsule 1  . Polyethyl Glycol-Propyl Glycol (SYSTANE OP) Apply 2-3 drops to eye 3 (three) times daily as needed (dry eyes).    . potassium chloride (KLOR-CON) 10 MEQ tablet Take 0.5 tablets (5 mEq total) by mouth 3 (three) times a week. Mon, Wed & Fri, can take extra as needed 45 tablet 3  .  simvastatin (ZOCOR) 40 MG tablet Take 1 tablet (40 mg total) by mouth daily. 90 tablet 1  . spironolactone (ALDACTONE) 25 MG tablet Take 1 tablet (25 mg total) by mouth daily. 90 tablet 3  . tamsulosin (FLOMAX) 0.4 MG CAPS capsule Take 0.4 mg by mouth daily.    Marland Kitchen torsemide (DEMADEX) 20 MG tablet Take 1 tablet (20 mg total) by mouth 3 (three) times a week. Mon, Wed & Fri, can take extra as needed 90 tablet 3  . warfarin (COUMADIN) 5 MG tablet Take 1/2 to 1 tablet by mouth daily as directed (Patient taking differently: Take 2.5-5 mg by mouth See admin instructions. Take 1/2 to 1 tablet by mouth daily as directed; Sun Tues Thurs 5 mg, all other days 2.5 mg) 90 tablet 1   No current facility-administered medications for this visit.    ALLERGIES:   Amoxicillin and Penicillins   SOCIAL HISTORY:  The patient  reports that he has never smoked. He has never used smokeless tobacco. He reports current alcohol use of about 2.0 standard drinks of alcohol per week. He reports that he does not use drugs.   FAMILY HISTORY:  The patient's family history includes Healthy in his brother and sister; Heart disease in his father; Heart failure in his sister; Parkinsonism in his brother; Stroke in his mother.   REVIEW OF SYSTEMS:  Positive for occasional GI upset.   All other systems are reviewed and negative.   PHYSICAL EXAM: VS:  BP 96/60   Pulse 67   Ht 5\' 7"  (1.702 m)   Wt 133 lb (60.3 kg)   SpO2 95%   BMI 20.83 kg/m  , BMI Body mass index is 20.83 kg/m. GEN: Thin, elderly male in no acute distress HEENT: normal Neck: No JVD. carotids 2+ without bruits or masses Cardiac: The heart is RRR with a 2/6 HSM at the apex. No edema. Pedal pulses 2+ = bilaterally  Respiratory:  clear to auscultation bilaterally GI: soft, nontender, nondistended, + BS MS: no deformity or atrophy Skin: warm and dry, no rash Neuro:  Strength and sensation are intact Psych: euthymic mood, full affect  EKG:  EKG from today  reviewed and demonstrates AV sequential pacing 67 bpm  RECENT LABS: 07/13/2019: B Natriuretic Peptide 655.9; BUN 14; Creatinine, Ser 0.82; Hemoglobin 14.0; Platelets 252; Potassium 3.8; Sodium 135  No results found for requested labs within last 8760 hours.   Estimated Creatinine Clearance: 62.3 mL/min (by C-G formula based on SCr of 0.82 mg/dL).   Wt Readings from Last 3 Encounters:  07/31/19 133 lb (60.3 kg)  07/21/19 133 lb (60.3 kg)  07/13/19 132 lb 6.4 oz (60.1 kg)  CARDIAC STUDIES: Echo: IMPRESSIONS    1. Left ventricular ejection fraction, by estimation, is 20%. The left  ventricle has severely decreased function. The left ventricle demonstrates  global hypokinesis. The left ventricular internal cavity size was severely  dilated. Left ventricular  diastolic function could not be evaluated.  2. Right ventricular systolic function is moderately reduced. The right  ventricular size is mildly enlarged. There is moderately elevated  pulmonary artery systolic pressure.  3. Left atrial size was severely dilated.  4. Right atrial size was severely dilated.  5. Mitral valve leaflet tenting due to left ventricular dilation and  dysfunction. The mitral valve is abnormal. Severe mitral valve  regurgitation.  6. Tricuspid valve regurgitation is moderate to severe.  7. The aortic valve is tricuspid. Aortic valve regurgitation is moderate  to severe.  8. The inferior vena cava is dilated in size with <50% respiratory  variability, suggesting right atrial pressure of 15 mmHg.   TEE: FINDINGS:  LEFT VENTRICLE: Markedly dilated. Global HK EF = 20%. Marland Kitchen  RIGHT VENTRICLE: Severe HK + ICD wire  LEFT ATRIUM: Severely dilated  LEFT ATRIAL APPENDAGE: No thrombus.   RIGHT ATRIUM: Dilated + ICD wire  AORTIC VALVE:  Trileaflet. Moderate central AI  MITRAL VALVE:    Mild restriction of P2, 3+ central MR. No flow reversal in PVs  TRICUSPID VALVE: Normal. Moderate  TR  PULMONIC VALVE: Grossly normal. Trivial PI  INTERATRIAL SEPTUM: No PFO or ASD.  PERICARDIUM: No effusion  DESCENDING AORTA: Moderate plaque  CPX: Conclusion: Exercise testing with gas exchange demonstrates moderate functional impairment when compared to matched sedentary norms. Patient is moderately HF limited with high risk factors (elevated VE/VCO2 slope and blunted BP response) both of which independently predict worsening short-term prognosis. There was also chronotropic incompetence. Overall, CPX worsened from 2018.     Test, report and preliminary impression by:  Landis Martins, MS, ACSM-RCEP  06/09/2019 11:54 AM   Agree with above. Moderate to severe limitation primarily due to HF.    Glori Bickers, MD  8:17 PM   ASSESSMENT AND PLAN: 29.  79 year old gentleman with longstanding severe ischemic cardiomyopathy and severely dilated LV presents for evaluation of moderately severe secondary mitral regurgitation.  I have reviewed the natural history of mitral regurgitation with the patient and their family members who are present today. We have discussed the limitations of medical therapy and the poor prognosis associated with symptomatic mitral regurgitation. We have also reviewed potential treatment options, including palliative medical therapy, conventional surgical mitral valve repair or replacement, and percutaneous mitral valve therapies such as edge-to-edge mitral valve approximation with MitraClip. We discussed treatment options in the context of this patient's specific comorbid medical conditions.  We discussed specific issues around transcatheter edge-to-edge mitral valve repair including the risks, potential benefits, and alternatives.  He understands it he would require right and left heart catheterization before proceeding with transcatheter treatment with MitraClip.  This will be performed to define intracardiac hemodynamics and assess for graft patency.  The  patient has a good functional capacity, with New York Heart Association functional class I-II symptoms but minimal complaints of exertional dyspnea.  His cardiopulmonary exercise test certainly raises concern of heart failure related limitation.  I have personally reviewed his 2D echo and transesophageal echo images.  His echo studies are impressive for the degree of LV dilatation with an LV end-diastolic diameter of 7 cm and end-systolic diameter of 6.7 cm.  LVEF is estimated at 20% on both studies.  The  patient has 3+ mitral regurgitation with a combination of Carpentier class I (annular dilatation) and class IIIb (systolic restriction of the posterior leaflet) dysfunction.  I think his degree of mitral regurgitation is proportionate to his LV dilatation and dysfunction associated with his cardiomyopathy.  I think it is unlikely that transcatheter edge-to-edge repair of his mitral valve will make a meaningful clinical impact and have recommended ongoing medical therapy under the care of Dr. [name].  If he develops progressive symptoms in the future it would be reasonable to perform right and left heart catheterization to assess for graft patency and intracardiac hemodynamics which might better define the hemodynamic significance of his mitral regurgitation.  At this point the patient and his wife prefer to continue on the current treatment course.  Deatra James 07/31/2019 12:17 PM     [place] [address]  [phone] (office) [phone] (fax)

## 2019-07-31 NOTE — Patient Instructions (Addendum)
It was a pleasure to meet you today! Call us if you need anything.

## 2019-07-31 NOTE — Telephone Encounter (Signed)
Remote ICM transmission received.  Attempted call to patient regarding ICM remote transmission and left detailed message per DPR.  Advised to return call for any fluid symptoms or questions. Next ICM remote transmission scheduled 08/31/2019.   ° ° °

## 2019-07-31 NOTE — Progress Notes (Signed)
EPIC Encounter for ICM Monitoring  Patient Name: [name] is a 79 y.o. male Date: 07/31/2019 Primary Care Physican: [name], MD Primary Cardiologist:Kelly/Bensimhon Electrophysiologist: [name] BiV Pacing:96.9% 6/1//2021Weight: 130-135lbs   Time in AT/AF 0.0 hr/day (0.0%) (on Coumadin)   Attempted call to patient and unable to reach.  Left detailed message per DPR regarding transmission. Transmission reviewed.    Optivol thoracic impedancenormal.  Prescribed: Torsemide 20 mg 1 tablettree times a week Mon, Wed & Friday.  Can take extra as needed Potassium 10 mEq take 0.5 tablet three times a week Mon, Wed & Friday.  Can take extra as needed Spironolactone 25 mg take 1 table daily  Labs: 07/13/2019 Creatinine 0.82, BUN 14, Potassium 3.8, Sodium 135, GFR >60 05/18/2019 Creatinine 0.88, BUN 15, Potassium 3.9, Sodium 135, GFR >60 05/11/2019 Creatinine 0.92, BUN 22, Potassium 3.4, Sodium 137, GFR 79-91 01/13/2021Creatinine0.92, BUN 16, Potassium4.6, Sodium 134, GFR >60 A complete set of results can be found in Results Review  Recommendations:Left voice mail with ICM number and encouraged to call if experiencing any fluid symptoms.  Follow-up plan: ICM clinic phone appointment on 08/31/2019.   91 day device clinic remote transmission 10/22/2019.    EP/Cardiology Office Visits: 10/16/2019 with Dr. Vaughan Browner.    Copy of ICM check sent to Dr. [name].   3 month ICM trend: 07/31/2019    1 Year ICM trend:       [name], RN 07/31/2019 3:58 PM

## 2019-08-04 DIAGNOSIS — Z961 Presence of intraocular lens: Secondary | ICD-10-CM | POA: Diagnosis not present

## 2019-08-06 NOTE — Addendum Note (Signed)
Addended by: Gar Ponto on: 08/06/2019 07:24 AM   Modules accepted: Orders

## 2019-08-10 ENCOUNTER — Ambulatory Visit (INDEPENDENT_AMBULATORY_CARE_PROVIDER_SITE_OTHER): Payer: Medicare HMO | Admitting: Pharmacist Clinician (PhC)/ Clinical Pharmacy Specialist

## 2019-08-10 ENCOUNTER — Other Ambulatory Visit: Payer: Self-pay

## 2019-08-10 ENCOUNTER — Ambulatory Visit: Payer: Medicare HMO | Admitting: Cardiovascular Disease

## 2019-08-10 DIAGNOSIS — Z5181 Encounter for therapeutic drug level monitoring: Secondary | ICD-10-CM

## 2019-08-10 DIAGNOSIS — Z7901 Long term (current) use of anticoagulants: Secondary | ICD-10-CM

## 2019-08-10 DIAGNOSIS — I480 Paroxysmal atrial fibrillation: Secondary | ICD-10-CM

## 2019-08-10 DIAGNOSIS — I4891 Unspecified atrial fibrillation: Secondary | ICD-10-CM

## 2019-08-10 LAB — POCT INR: INR: 1.7 — AB (ref 2.0–3.0)

## 2019-08-19 DIAGNOSIS — J301 Allergic rhinitis due to pollen: Secondary | ICD-10-CM | POA: Diagnosis not present

## 2019-08-19 DIAGNOSIS — H9313 Tinnitus, bilateral: Secondary | ICD-10-CM | POA: Diagnosis not present

## 2019-08-19 DIAGNOSIS — J3089 Other allergic rhinitis: Secondary | ICD-10-CM | POA: Diagnosis not present

## 2019-08-19 DIAGNOSIS — J3081 Allergic rhinitis due to animal (cat) (dog) hair and dander: Secondary | ICD-10-CM | POA: Diagnosis not present

## 2019-08-25 ENCOUNTER — Ambulatory Visit (INDEPENDENT_AMBULATORY_CARE_PROVIDER_SITE_OTHER): Payer: Medicare HMO | Admitting: Gastroenterology

## 2019-08-25 ENCOUNTER — Encounter: Payer: Self-pay | Admitting: Gastroenterology

## 2019-08-25 ENCOUNTER — Other Ambulatory Visit (INDEPENDENT_AMBULATORY_CARE_PROVIDER_SITE_OTHER): Payer: Medicare HMO

## 2019-08-25 VITALS — BP 88/58 | HR 62 | Ht 67.0 in | Wt 133.0 lb

## 2019-08-25 DIAGNOSIS — K589 Irritable bowel syndrome without diarrhea: Secondary | ICD-10-CM

## 2019-08-25 DIAGNOSIS — R109 Unspecified abdominal pain: Secondary | ICD-10-CM

## 2019-08-25 LAB — H. PYLORI ANTIBODY, IGG: H Pylori IgG: NEGATIVE

## 2019-08-25 MED ORDER — CITRUCEL PO POWD
ORAL | Status: DC
Start: 2019-08-25 — End: 2020-07-07

## 2019-08-25 MED ORDER — OMEPRAZOLE 20 MG PO CPDR: 20.0000 mg | DELAYED_RELEASE_CAPSULE | Freq: Two times a day (BID) | ORAL | 1 refills | Status: DC

## 2019-08-25 MED ORDER — DICYCLOMINE HCL 10 MG PO CAPS: 10.0000 mg | ORAL_CAPSULE | Freq: Three times a day (TID) | ORAL | 0 refills | Status: DC | PRN

## 2019-08-25 MED ORDER — OMEPRAZOLE 20 MG PO CPDR: 20.0000 mg | DELAYED_RELEASE_CAPSULE | Freq: Two times a day (BID) | ORAL | 3 refills | Status: DC

## 2019-08-25 NOTE — Patient Instructions (Addendum)
If you are age 80 or older, your body mass index should be between 23-30. Your Body mass index is 20.83 kg/m. If this is out of the aforementioned range listed, please consider follow up with your Primary Care Provider.  If you are age 80 or younger, your body mass index should be between 19-25. Your Body mass index is 20.83 kg/m. If this is out of the aformentioned range listed, please consider follow up with your Primary Care Provider.  Please go to the lab in the basement of our building to have lab work done as you leave today. Hit "B" for basement when you get on the elevator.  When the doors open the lab is on your left.  We will call you with the results. Thank you.  Due to recent changes in healthcare laws, you may see the results of your imaging and laboratory studies on MyChart before your provider has had a chance to review them.  We understand that in some cases there may be results that are confusing or concerning to you. Not all laboratory results come back in the same time frame and the provider may be waiting for multiple results in order to interpret others.  Please give [name] 48 hours in order for your provider to thoroughly review all the results before contacting the office for clarification of your results.    Stop Metamucil.  Please purchase the following medications over the counter and take as directed: Citrucel, take as directed.  Take Bentyl as needed.  Increase omeprazole to twice daily.  Thank you for entrusting me with your care and for choosing [facility], Dr.  Cellar

## 2019-08-25 NOTE — Progress Notes (Signed)
HPI :  79 year old male here for follow-up visit for IBS and abdominal pain.He has a history of CAD, NSVT, history of CHF last EF 20-25%, on coumadin, rectal bleeding due to hemorrhoids, IBS.  He has had chronic symptoms of IBS over the years.  His first colonoscopy was last October as outlined below without any significant lesions, biopsies negative for microscopic colitis.  He has had a CT scan of his abdomen pelvis in the past year which looked okay. He has had longstanding irritable bowel syndrome for several years.  Main symptoms include spastic bowel with cramps, abdominal bloating and distention, occasional loose stools, sometimes rare constipation.  He has been tried on a variety of regimens to include Bentyl, peppermint supplement, IB gard, Rifaximin, low FODMAP diet - no help with any of these really  He tested negative for celiac disease.  He has had hemorrhoids banded once in the past which provided significant benefit.  We have held off on further banding of his hemorrhoids as they are controlled and he is anticoagulated.  Since her last visit I had recommended a trial of nortriptyline given his persistent symptoms.  He states once he read more about it he did not like potential risks and did not start it.  He complains of intermittent abdominal cramps that continue to bother him, this actually appears more epigastric and upper abdomen that has in the past. They come and go. He takes omeprazole 20 mg a day and states for the most part it works okay, sometimes eating particular food triggers gives him some flareup of that.  His bowels have been moving fairly regularly while taking Metamucil but he does endorse ongoing bloating with that.  He has been taking Bentyl as a standing dose every 8 hours even if he feels well.  His weight has been stable, no weight loss, denies any NSAIDs.  He denies any clear triggers to the symptoms, seem to come and go on their own, without any prandial relationship.   He had an EGD in 1996 which apparently looked okay.  He continues to follow closely with cardiology for his heart failure.  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 - One 4 mm polyp at the hepatic flexure, removed with a cold snare. Resected and retrieved. - Diverticulosis in the entire examined colon. - Internal hemorrhoids. - The examination was otherwise normal. No overt inflammation - Biopsies were taken with a cold forceps from the right colon, left colon and transverse colon for evaluation of microscopic colitis.  Path showed adenomatous polyp, and biopsies negative for microscopic colitis  Past Medical History:  Diagnosis Date  . Acute on chronic systolic CHF (congestive heart failure) ([place]) 06/09/2015  . 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 Cardiomyopathy, ischemic 2011   with EF 25-35% by echo  . Coronary artery disease    Hx MI 1992, CABG 1998 , Nuc study 11.2013 large scar but no ischemia  . GERD (gastroesophageal reflux disease)   . 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 NSVT (nonsustained ventricular tachycardia) ([place])   . Paroxysmal atrial fibrillation [facility])      Past Surgical History:  Procedure Laterality Date  . BIOPSY  11/04/2018   Procedure: BIOPSY;  Surgeon: Yetta Flock, MD;  Location: [facility];  Service: Gastroenterology;;  . Zollie Beckers N/A 09/10/2016   Procedure: BiV ICD Upgrade;  Surgeon: Virl Axe  C, MD;  Location: [facility] CV LAB;  Service: Cardiovascular;  Laterality: N/A;  . CATARACT EXTRACTION, BILATERAL    . COLONOSCOPY WITH PROPOFOL N/A 11/04/2018   Procedure: COLONOSCOPY WITH PROPOFOL;  Surgeon: Yetta Flock, MD;  Location: [facility] ENDOSCOPY;  Service: Gastroenterology;  Laterality: N/A;  . CORONARY ARTERY BYPASS GRAFT  1998   x 5  . HERNIA REPAIR  1960's   right inguinal  . ICD GENERATOR  CHANGE  01/2008, 2018   medtronic, hx+ EP study  . KNEE SURGERY  1960s   right, acl repair   . NASAL SINUS SURGERY  05/31/2010   Dr. Benjamine Mola  . POLYPECTOMY  11/04/2018   Procedure: POLYPECTOMY;  Surgeon: Yetta Flock, MD;  Location: [facility] ENDOSCOPY;  Service: Gastroenterology;;  . TEE WITHOUT CARDIOVERSION N/A 07/21/2019   Procedure: TRANSESOPHAGEAL ECHOCARDIOGRAM (TEE);  Surgeon: Jolaine Artist, MD;  Location: [facility] ENDOSCOPY;  Service: Cardiovascular;  Laterality: N/A;  . UPPER GI ENDOSCOPY     Family History  Problem Relation Age of Onset  . Heart disease Father        questionable  . Stroke Mother   . Heart failure Sister   . Healthy Brother   . Healthy Sister   . Parkinsonism Brother   . Colon cancer Neg Hx    Social History   Tobacco Use  . Smoking status: Never Smoker  . Smokeless tobacco: Never Used  Vaping Use  . Vaping Use: Never used  Substance Use Topics  . Alcohol use: Yes    Alcohol/week: 2.0 standard drinks    Types: 2 Glasses of wine per week    Comment: socially  . Drug use: No   Current Outpatient Medications  Medication Sig Dispense Refill  . acetaminophen (TYLENOL) 650 MG CR tablet Take 650 mg by mouth 2 (two) times daily.     Marland Kitchen allopurinol (ZYLOPRIM) 300 MG tablet Take 300 mg by mouth daily.    . bisoprolol (ZEBETA) 5 MG tablet Take 0.5 tablets (2.5 mg total) by mouth daily. 45 tablet 3  . cetirizine (ZYRTEC) 10 MG tablet Take 10 mg by mouth daily.      . Cholecalciferol (VITAMIN D) 2000 units CAPS Take 2,000-4,000 capsules by mouth See admin instructions. 4000 units MWF, 2000 units all other days    . dapagliflozin propanediol (FARXIGA) 10 MG TABS tablet Take 10 mg by mouth daily before breakfast. 30 tablet 6  . dicyclomine (BENTYL) 10 MG capsule Take 1 capsule (10 mg total) by mouth every 8 (eight) hours as needed for spasms. 180 capsule 0  . isosorbide mononitrate (IMDUR) 30 MG 24 hr tablet Take 0.5 tablets (15 mg total) by mouth daily. 45 tablet 3   . LORazepam (ATIVAN) 1 MG tablet Take 0.25-0.5 mg by mouth at bedtime.     Marland Kitchen losartan (COZAAR) 25 MG tablet TAKE ONE TABLET BY MOUTH TWICE A DAY 180 tablet 3  . magnesium oxide (MAG-OX) 400 MG tablet Take 400 mg by mouth daily at 12 noon.     . mometasone (ASMANEX) 220 MCG/INH inhaler Inhale 1 puff into the lungs daily.     . montelukast (SINGULAIR) 10 MG tablet Take 1 tablet (10 mg total) by mouth at bedtime. PLEASE SCHEDULE APPOINTMENT. 30 tablet 0  . omeprazole (PRILOSEC) 20 MG capsule Take 1 capsule (20 mg total) by mouth 2 (two) times daily before a meal. 180 capsule 1  . Polyethyl Glycol-Propyl Glycol (SYSTANE OP) Apply 2-3 drops to eye 3 (three) times  daily as needed (dry eyes).    . potassium chloride (KLOR-CON) 10 MEQ tablet Take 0.5 tablets (5 mEq total) by mouth 3 (three) times a week. Mon, Wed & Fri, can take extra as needed 45 tablet 3  . simvastatin (ZOCOR) 40 MG tablet Take 1 tablet (40 mg total) by mouth daily. 90 tablet 1  . spironolactone (ALDACTONE) 25 MG tablet Take 1 tablet (25 mg total) by mouth daily. 90 tablet 3  . tamsulosin (FLOMAX) 0.4 MG CAPS capsule Take 0.4 mg by mouth daily.    Marland Kitchen torsemide (DEMADEX) 20 MG tablet Take 1 tablet (20 mg total) by mouth 3 (three) times a week. Mon, Wed & Fri, can take extra as needed 90 tablet 3  . warfarin (COUMADIN) 5 MG tablet Take 1/2 to 1 tablet by mouth daily as directed (Patient taking differently: Take 2.5-5 mg by mouth See admin instructions. Take 1/2 to 1 tablet by mouth daily as directed; Sun Tues Thurs 5 mg, all other days 2.5 mg) 90 tablet 1  . methylcellulose (CITRUCEL) oral powder Take as directed     No current facility-administered medications for this visit.   Allergies  Allergen Reactions  . Amoxicillin Rash    Has patient had a PCN reaction causing immediate rash, facial/tongue/throat swelling, SOB or lightheadedness with hypotension: Yes Has patient had a PCN reaction causing severe rash involving mucus membranes  or skin necrosis: No Has patient had a PCN reaction that required hospitalization: No Has patient had a PCN reaction occurring within the last 10 years: No If all of the above answers are "NO", then may proceed with Cephalosporin use.   Marland Kitchen Penicillins Rash    Has patient had a PCN reaction causing immediate rash, facial/tongue/throat swelling, SOB or lightheadedness with hypotension: Yes Has patient had a PCN reaction causing severe rash involving mucus membranes or skin necrosis: No Has patient had a PCN reaction that required hospitalization: No Has patient had a PCN reaction occurring within the last 10 years: No If all of the above answers are "NO", then may proceed with Cephalosporin use.      Review of Systems: All systems reviewed and negative except where noted in HPI.   Lab Results  Component Value Date   WBC 7.7 07/13/2019   HGB 14.0 07/13/2019   HCT 44.6 07/13/2019   MCV 91.0 07/13/2019   PLT 252 07/13/2019    Lab Results  Component Value Date   CREATININE 0.82 07/13/2019   BUN 14 07/13/2019   NA 135 07/13/2019   K 3.8 07/13/2019   CL 100 07/13/2019   CO2 27 07/13/2019    Lab Results  Component Value Date   ALT 16 03/24/2018   AST 24 03/24/2018   ALKPHOS 67 03/24/2018   BILITOT 1.1 03/24/2018     Physical Exam: BP (!) 88/58   Pulse 62   Ht 5\' 7"  (1.702 m)   Wt 133 lb (60.3 kg)   BMI 20.83 kg/m  Constitutional: Pleasant,well-developed, male in no acute distress. Abdominal: Soft, nondistended, nontender. . There are no masses palpable.  Extremities: no edema Lymphadenopathy: No cervical adenopathy noted. Neurological: Alert and oriented to person place and time. Skin: Skin is warm and dry. No rashes noted. Psychiatric: Normal mood and affect. Behavior is normal.   ASSESSMENT AND PLAN: 79 year old male here for reassessment the following:  Abdominal pain / history of IBS / bloating - as above, recent CT scan and colonoscopy reassuring without  concerning pathology, he is followed  up with urology regarding his prostate findings. Labs stable and reassuring. He has chronic symptoms that are most consistent with irritable bowel syndrome however his pain is a little bit higher up / more epigastric than usual today.  Discussed options with him, he is high risk for endoscopy in light of his cardiac history wants to avoid invasive procedures if possible.  I will send an IgG H. pylori serology to ensure negative, he can try empirically increasing his omeprazole to 20 twice daily to see if that helps in case this is represents dyspepsia, although I am not certain it does.  He does have a lot of bloating and has been using Metamucil, recommend he switch this to Citrucel to reduce the bloating and see if that helps.  He otherwise has been taking Bentyl as a standing dose every 8 hours, if he is not in pain at all think he needs to take this, would rather have him use it as needed when he has the pain and see if that helps abort it.  He agreed, asked him to update me in a few weeks and let me know how he is doing.  As above he has declined TCA after review of potential risks.  [place] Cellar, MD [facility]

## 2019-08-26 ENCOUNTER — Telehealth: Payer: Self-pay | Admitting: Gastroenterology

## 2019-08-26 DIAGNOSIS — N400 Enlarged prostate without lower urinary tract symptoms: Secondary | ICD-10-CM | POA: Diagnosis not present

## 2019-08-26 DIAGNOSIS — E782 Mixed hyperlipidemia: Secondary | ICD-10-CM | POA: Diagnosis not present

## 2019-08-26 DIAGNOSIS — E7800 Pure hypercholesterolemia, unspecified: Secondary | ICD-10-CM | POA: Diagnosis not present

## 2019-08-26 DIAGNOSIS — I2510 Atherosclerotic heart disease of native coronary artery without angina pectoris: Secondary | ICD-10-CM | POA: Diagnosis not present

## 2019-08-26 DIAGNOSIS — J449 Chronic obstructive pulmonary disease, unspecified: Secondary | ICD-10-CM | POA: Diagnosis not present

## 2019-08-26 DIAGNOSIS — I480 Paroxysmal atrial fibrillation: Secondary | ICD-10-CM | POA: Diagnosis not present

## 2019-08-26 DIAGNOSIS — I5023 Acute on chronic systolic (congestive) heart failure: Secondary | ICD-10-CM | POA: Diagnosis not present

## 2019-08-26 DIAGNOSIS — I10 Essential (primary) hypertension: Secondary | ICD-10-CM | POA: Diagnosis not present

## 2019-08-26 DIAGNOSIS — J45909 Unspecified asthma, uncomplicated: Secondary | ICD-10-CM | POA: Diagnosis not present

## 2019-08-26 NOTE — Telephone Encounter (Signed)
Pt is requesting lab results from yesterday

## 2019-08-27 NOTE — Telephone Encounter (Signed)
I reviewed the results and MyChart message that was sent to the patient. All questions answered

## 2019-08-31 ENCOUNTER — Ambulatory Visit (INDEPENDENT_AMBULATORY_CARE_PROVIDER_SITE_OTHER): Payer: Medicare HMO

## 2019-08-31 DIAGNOSIS — I5022 Chronic systolic (congestive) heart failure: Secondary | ICD-10-CM | POA: Diagnosis not present

## 2019-08-31 DIAGNOSIS — Z95810 Presence of automatic (implantable) cardiac defibrillator: Secondary | ICD-10-CM

## 2019-09-02 ENCOUNTER — Ambulatory Visit (INDEPENDENT_AMBULATORY_CARE_PROVIDER_SITE_OTHER): Payer: Medicare HMO

## 2019-09-02 ENCOUNTER — Other Ambulatory Visit: Payer: Self-pay

## 2019-09-02 DIAGNOSIS — Z7901 Long term (current) use of anticoagulants: Secondary | ICD-10-CM

## 2019-09-02 DIAGNOSIS — I4891 Unspecified atrial fibrillation: Secondary | ICD-10-CM | POA: Diagnosis not present

## 2019-09-02 DIAGNOSIS — Z5181 Encounter for therapeutic drug level monitoring: Secondary | ICD-10-CM | POA: Diagnosis not present

## 2019-09-02 DIAGNOSIS — I480 Paroxysmal atrial fibrillation: Secondary | ICD-10-CM

## 2019-09-02 LAB — POCT INR: INR: 2.2 (ref 2.0–3.0)

## 2019-09-02 NOTE — Patient Instructions (Signed)
Continue taking 1 tablet daily except 1/2 tablet each Monday, Wednesday and Friday.  Repeat INR in 4 weeks

## 2019-09-02 NOTE — Progress Notes (Signed)
EPIC Encounter for ICM Monitoring  Patient Name: [name] is a 79 y.o. male Date: 09/02/2019 Primary Care Physican: [name], MD Primary Cardiologist:Kelly/Bensimhon Electrophysiologist: [name] BiV Pacing:97.2% 8/4/2021Weight: 132-134lbs   Time in AT/AF 0.0 hr/day (0.0%) (on Coumadin)   Spoke with patient and reports feeling well at this time.  Denies fluid symptoms.    Optivol thoracic impedancenormal.  Prescribed: Torsemide 20 mg 1 tablettree times a week Mon, Wed & Friday.  Can take extra as needed Potassium 10 mEq take 0.5 tablet three times a week Mon, Wed & Friday.  Can take extra as needed Spironolactone 25 mg take 1 table daily  Labs: 07/13/2019 Creatinine 0.82, BUN 14, Potassium 3.8, Sodium 135, GFR >60 05/18/2019 Creatinine 0.88, BUN 15, Potassium 3.9, Sodium 135, GFR >60 05/11/2019 Creatinine 0.92, BUN 22, Potassium 3.4, Sodium 137, GFR 79-91 01/13/2021Creatinine0.92, BUN 16, Potassium4.6, Sodium 134, GFR >60 A complete set of results can be found in Results Review  Recommendations:  No changes and encouraged to call if experiencing any fluid symptoms.  Follow-up plan: ICM clinic phone appointment on 10/06/2019.   91 day device clinic remote transmission 10/22/2019.    EP/Cardiology Office Visits: 10/16/2019 with Dr. Vaughan Browner.    Copy of ICM check sent to Dr. [name].   3 month ICM trend: 08/31/2019    1 Year ICM trend:       [name], RN 09/02/2019 1:16 PM

## 2019-09-15 ENCOUNTER — Other Ambulatory Visit (HOSPITAL_COMMUNITY): Payer: Self-pay

## 2019-09-15 MED ORDER — WARFARIN SODIUM 5 MG PO TABS: 2.5000 mg | ORAL_TABLET | ORAL | 6 refills | Status: DC

## 2019-09-18 DIAGNOSIS — I480 Paroxysmal atrial fibrillation: Secondary | ICD-10-CM | POA: Diagnosis not present

## 2019-09-18 DIAGNOSIS — I5023 Acute on chronic systolic (congestive) heart failure: Secondary | ICD-10-CM | POA: Diagnosis not present

## 2019-09-18 DIAGNOSIS — J449 Chronic obstructive pulmonary disease, unspecified: Secondary | ICD-10-CM | POA: Diagnosis not present

## 2019-09-18 DIAGNOSIS — N400 Enlarged prostate without lower urinary tract symptoms: Secondary | ICD-10-CM | POA: Diagnosis not present

## 2019-09-18 DIAGNOSIS — I10 Essential (primary) hypertension: Secondary | ICD-10-CM | POA: Diagnosis not present

## 2019-09-18 DIAGNOSIS — E7800 Pure hypercholesterolemia, unspecified: Secondary | ICD-10-CM | POA: Diagnosis not present

## 2019-09-18 DIAGNOSIS — E782 Mixed hyperlipidemia: Secondary | ICD-10-CM | POA: Diagnosis not present

## 2019-09-18 DIAGNOSIS — J45909 Unspecified asthma, uncomplicated: Secondary | ICD-10-CM | POA: Diagnosis not present

## 2019-09-18 DIAGNOSIS — I2510 Atherosclerotic heart disease of native coronary artery without angina pectoris: Secondary | ICD-10-CM | POA: Diagnosis not present

## 2019-09-30 ENCOUNTER — Ambulatory Visit (INDEPENDENT_AMBULATORY_CARE_PROVIDER_SITE_OTHER): Payer: Medicare HMO

## 2019-09-30 ENCOUNTER — Other Ambulatory Visit: Payer: Self-pay

## 2019-09-30 DIAGNOSIS — Z5181 Encounter for therapeutic drug level monitoring: Secondary | ICD-10-CM | POA: Diagnosis not present

## 2019-09-30 DIAGNOSIS — I480 Paroxysmal atrial fibrillation: Secondary | ICD-10-CM | POA: Diagnosis not present

## 2019-09-30 DIAGNOSIS — I4891 Unspecified atrial fibrillation: Secondary | ICD-10-CM

## 2019-09-30 DIAGNOSIS — Z7901 Long term (current) use of anticoagulants: Secondary | ICD-10-CM

## 2019-09-30 LAB — POCT INR: INR: 2.5 (ref 2.0–3.0)

## 2019-09-30 NOTE — Patient Instructions (Signed)
Continue taking 1 tablet daily except 1/2 tablet each Monday, Wednesday and Friday.  Repeat INR in 6 weeks

## 2019-10-06 ENCOUNTER — Ambulatory Visit (INDEPENDENT_AMBULATORY_CARE_PROVIDER_SITE_OTHER): Payer: Medicare HMO

## 2019-10-06 DIAGNOSIS — Z95810 Presence of automatic (implantable) cardiac defibrillator: Secondary | ICD-10-CM | POA: Diagnosis not present

## 2019-10-06 DIAGNOSIS — I5022 Chronic systolic (congestive) heart failure: Secondary | ICD-10-CM

## 2019-10-07 NOTE — Progress Notes (Signed)
EPIC Encounter for ICM Monitoring  Patient Name: [name] is a 79 y.o. male Date: 10/07/2019 Primary Care Physican: [name], MD Primary Cardiologist:Kelly/Bensimhon Electrophysiologist: [name] BiV Pacing:95.5% 9/8/2021Weight: 132-134lbs   Time in AT/AF 0.0 hr/day (0.0%) (on Coumadin)   Spoke with patient and reports feeling well at this time.  Denies fluid symptoms.    Optivol thoracic impedancenormal.  Prescribed:  Torsemide 20 mg 1 tablettree times a week Mon, Wed &Friday. Can take extra as needed  Potassium 10 mEq take 0.5 tabletthree times a week Mon, Wed &Friday. Can take extra as needed  Spironolactone 25 mg take 1 table daily  Labs: 07/13/2019 Creatinine 0.82, BUN 14, Potassium 3.8, Sodium 135, GFR >60 05/18/2019 Creatinine 0.88, BUN 15, Potassium 3.9, Sodium 135, GFR >60 05/11/2019 Creatinine 0.92, BUN 22, Potassium 3.4, Sodium 137, GFR 79-91 01/13/2021Creatinine0.92, BUN 16, Potassium4.6, Sodium 134, GFR >60 A complete set of results can be found in Results Review  Recommendations:  No changes and encouraged to call if experiencing any fluid symptoms.  Follow-up plan: ICM clinic phone appointment on10/11/2019. 91 day device clinic remote transmission 10/22/2019.   EP/Cardiology Office Visits:10/16/2019 with [place].   Copy of ICM check sent to [facility].   3 month ICM trend: 10/06/2019    1 Year ICM trend:       [name], RN 10/07/2019 3:04 PM

## 2019-10-15 DIAGNOSIS — H353120 Nonexudative age-related macular degeneration, left eye, stage unspecified: Secondary | ICD-10-CM | POA: Diagnosis not present

## 2019-10-15 DIAGNOSIS — H353211 Exudative age-related macular degeneration, right eye, with active choroidal neovascularization: Secondary | ICD-10-CM | POA: Diagnosis not present

## 2019-10-15 DIAGNOSIS — Z961 Presence of intraocular lens: Secondary | ICD-10-CM | POA: Diagnosis not present

## 2019-10-15 NOTE — Progress Notes (Signed)
Patient ID: ULMER DEGEN, male   DOB: 1940-10-04, 79 y.o.   MRN: [id number]    Advanced Heart Failure Clinic Note   Date:  10/16/2019   ID:  Nehal, Shives Jun 16, 1940, MRN [id number]  PCP:  [name], MD  Primary Cardiologist:  [name] Referring: Dr. [name]   History of Present Illness: JOANNE BRANDER is a 79 y.o. 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 In September 1998 he underwent CABG  after an unsuccessful attempt at stenting of his proximal LAD by [name]. Ejection fraction was 25-30%. He is s/p ICD with upgrade to CRT-D   Myoview 11/13  in November 2013 showed a large area of scar in the LAD territory (extent 44%) involving the mid to apical anterior,apical and infero-apical to mid infero-septal and apical lateral wall without associated ischemia.  Admitted 5/16 atrial fibrillation and started on Tikosyn. He was enrolled in the genetic AF trial (bucindolol vs Toprol). He did poorly in the trial in the setting of titration of beta-blocker. He was referred to HF Clinic  Echo 4/21 EF 20% LV markedly dilated. Severe MR  RV moderately reduced   At last visit I reviewed echo with Dr. Copper regarding MitraClip and felt to be too advanced  He presents today for follow up. Doing fairly well. Struggling with IBS. Remains active gets 5-6K steps per day. Mostly walks around the house. Notes that he has orthopnaa about 1/2 the nights. No edema. No CP. Remains very anxious about his condition.   CPX 5/21  FVC 3.17 (89%)    FEV1 2.35 (88%)     FEV1/FVC 74 (98%)     MVV 71 (66%)   Resting HR: 69 Standing HR: 72 Peak HR: 105  (74% age predicted max HR)  BP rest: 104/58 Standing BP: 90/56 BP peak: 112/60  Peak VO2: 15.5 (66% predicted peak VO2)  VE/VCO2 slope: 43  OUES: 1.12  Peak RER: 1.11   Ventilatory Threshold: 13.6 (57% predicted or measured peak VO2)  VE/MVV:  71%  O2pulse: 9  (90% predicted O2pulse)   Echo 04/04/17 EF 20% with mild to mod reduction RV function.   CPX 02/17/16 Resting HR: 73 Peak HR: 108  (75% age predicted max HR) BP rest: 110/58 BP peak: 150/56 Peak VO2: 14.4 (58% predicted peak VO2) VE/VCO2 slope: 33 OUES: 1.32 Peak RER: 1.10 PETCO2 at peak: 33 O2pulse: 9  (82% predicted O2pulse)  Echo 12/17 EF 15-20%, Moderate MR, RV with reduced function Echo 5/17 EF 20% RV mildly down.  Echo 3/19: EF 20%, RV mild to moderate reduced function    Labs 10/04/15 K 5.3, Creatinine 0.85, BUN 15  Past Medical History:  Diagnosis Date  . Acute on chronic systolic CHF (congestive heart failure) ([place]) 06/09/2015  . 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 Cardiomyopathy, ischemic 2011   with EF 25-35% by echo  . Coronary artery disease    Hx MI 1992, CABG 1998 , Nuc study 11.2013 large scar but no ischemia  . GERD (gastroesophageal reflux disease)   . 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 NSVT (nonsustained ventricular tachycardia) ([place])   . Paroxysmal atrial fibrillation ([facility])     Current Outpatient Medications  Medication Sig Dispense Refill  . acetaminophen (TYLENOL) 650 MG CR tablet Take 650 mg by mouth  2 (two) times daily.     Marland Kitchen allopurinol (ZYLOPRIM) 300 MG tablet Take 300 mg by mouth daily.    . cetirizine (ZYRTEC) 10 MG tablet Take 10 mg by mouth daily.      . Cholecalciferol (VITAMIN D) 2000 units CAPS Take 2,000-4,000 capsules by mouth See admin instructions. 4000 units MWF, 2000 units all other days    . dapagliflozin propanediol (FARXIGA) 10 MG TABS tablet Take 10 mg by mouth daily before breakfast. 30 tablet 6  . dicyclomine (BENTYL) 10 MG capsule Take 1 capsule (10 mg total) by mouth every 8 (eight) hours as needed for spasms. 180 capsule 0  . isosorbide mononitrate (IMDUR) 30 MG 24 hr tablet Take 0.5 tablets (15 mg total) by  mouth daily. 45 tablet 3  . LORazepam (ATIVAN) 1 MG tablet Take 0.25-0.5 mg by mouth at bedtime.     Marland Kitchen losartan (COZAAR) 25 MG tablet TAKE ONE TABLET BY MOUTH TWICE A DAY 180 tablet 3  . magnesium oxide (MAG-OX) 400 MG tablet Take 400 mg by mouth daily at 12 noon.     . methylcellulose (CITRUCEL) oral powder Take as directed    . mometasone (ASMANEX) 220 MCG/INH inhaler Inhale 1 puff into the lungs daily.     . montelukast (SINGULAIR) 10 MG tablet Take 1 tablet (10 mg total) by mouth at bedtime. PLEASE SCHEDULE APPOINTMENT. 30 tablet 0  . omeprazole (PRILOSEC) 20 MG capsule Take 1 capsule (20 mg total) by mouth 2 (two) times daily before a meal. 180 capsule 1  . Polyethyl Glycol-Propyl Glycol (SYSTANE OP) Apply 2-3 drops to eye 3 (three) times daily as needed (dry eyes).    . potassium chloride (KLOR-CON) 10 MEQ tablet Take 0.5 tablets (5 mEq total) by mouth 3 (three) times a week. Mon, Wed & Fri, can take extra as needed 45 tablet 3  . simvastatin (ZOCOR) 40 MG tablet Take 1 tablet (40 mg total) by mouth daily. 90 tablet 1  . spironolactone (ALDACTONE) 25 MG tablet Take 1 tablet (25 mg total) by mouth daily. 90 tablet 3  . tamsulosin (FLOMAX) 0.4 MG CAPS capsule Take 0.4 mg by mouth daily.    Marland Kitchen torsemide (DEMADEX) 20 MG tablet Take 1 tablet (20 mg total) by mouth 3 (three) times a week. Mon, Wed & Fri, can take extra as needed 90 tablet 3  . warfarin (COUMADIN) 5 MG tablet Take 0.5-1 tablets (2.5-5 mg total) by mouth See admin instructions. Take 1/2 to 1 tablet by mouth daily as directed; Sun Tues Thurs 5 mg, all other days 2.5 mg 20 tablet 6  . bisoprolol (ZEBETA) 5 MG tablet Take 0.5 tablets (2.5 mg total) by mouth daily. 45 tablet 3   No current facility-administered medications for this encounter.    Allergies:    Allergies  Allergen Reactions  . Amoxicillin Rash    Has patient had a PCN reaction causing immediate rash, facial/tongue/throat swelling, SOB or lightheadedness with  hypotension: Yes Has patient had a PCN reaction causing severe rash involving mucus membranes or skin necrosis: No Has patient had a PCN reaction that required hospitalization: No Has patient had a PCN reaction occurring within the last 10 years: No If all of the above answers are "NO", then may proceed with Cephalosporin use.   Marland Kitchen Penicillins Rash    Has patient had a PCN reaction causing immediate rash, facial/tongue/throat swelling, SOB or lightheadedness with hypotension: Yes Has patient had a PCN reaction causing severe rash involving mucus  membranes or skin necrosis: No Has patient had a PCN reaction that required hospitalization: No Has patient had a PCN reaction occurring within the last 10 years: No If all of the above answers are "NO", then may proceed with Cephalosporin use.     Social History:  The patient  reports that he has never smoked. He has never used smokeless tobacco. He reports current alcohol use of about 2.0 standard drinks of alcohol per week. He reports that he does not use drugs.   Family history:   Family History  Problem Relation Age of Onset  . Heart disease Father        questionable  . Stroke Mother   . Heart failure Sister   . Healthy Brother   . Healthy Sister   . Parkinsonism Brother   . Colon cancer Neg Hx    Review of systems complete and found to be negative unless listed in HPI.    PHYSICAL EXAM: Vitals:   10/16/19 1121  BP: 106/60  Pulse: 64  SpO2: 97%  Weight: 60.1 kg (132 lb 6.4 oz)  Height: 5\' 7"  (1.702 m)   Wt Readings from Last 3 Encounters:  10/16/19 60.1 kg (132 lb 6.4 oz)  08/25/19 60.3 kg (133 lb)  07/31/19 60.3 kg (133 lb)   General:  Well appearing. No resp difficulty HEENT: normal Neck: supple. no JVD. Carotids 2+ bilat; no bruits. No lymphadenopathy or thryomegaly appreciated. Cor: PMI nondisplaced. Regular rate & rhythm. No rubs, gallops 2/6 MR Lungs: clear Abdomen: soft, nontender, nondistended. No  hepatosplenomegaly. No bruits or masses. Good bowel sounds. Extremities: no cyanosis, clubbing, rash, edema Neuro: alert & orientedx3, cranial nerves grossly intact. moves all 4 extremities w/o difficulty. Affect pleasant  ICD interrogated volume low. No VT or AF. Personally reviewed  ASSESSMENT AND PLAN:  1. Chronic systolic HF due to iCM, Echo 12/2015 EF 20, Echo 3/19 EF 20% - CPX from 1/18 shows moderate HF limitation - Stable NYHA II symptoms s/p CRT device despite severe biV dysfunction - Echo 05/18/19 EF 20% with moderate RV dysfunction. LV markedly dilated LVIDd 7.0 cm Severe MR, mod AI, mod TR.  - CPX from 5/21. PVO2 improved since 2018 but VEVCO2 elevated suggestive of high pulmonary pressures with exercise - Rash after taking Entresto.  - Continue losartan 25 bid - Continue Farxiga 10 - Continue spiro 25 mg daily - Continue bisoprolol 2.5 mg daily. Unable to tolerate higher doses.  - Volume status is low after addition of [place]. Drop torsemide to 20mg  MWF. Can take extra as needed - He remains very anxious about his condition and options - I again spent a long time reassuring him that he has far exceeded my initial expectations based on the severity of his heart condition and currently I do not see any reason this can't continue.  . We did discuss the fact that he is not candidate for advanced therapies including transplant (age), VAD (size and RV dysfunction). MitraClip (LV failure too advanced)  - ICD interrogated personally in clinic - as above  2. PAF - Maintaining NSR on exam and ICD - Continue coumadin. Does not want Eliquis due to cost - No bleeding   3. CAD s/p previous anterior infarct - No s/s ischemia - Off ASA due to warfarin. Continue statin. - LDL followed by PCP  4. LBBB - Now s/p CRT. 100% biv pacing on device today  Glori Bickers, MD  11:46 AM

## 2019-10-16 ENCOUNTER — Other Ambulatory Visit: Payer: Self-pay

## 2019-10-16 ENCOUNTER — Ambulatory Visit (HOSPITAL_COMMUNITY)
Admission: RE | Admit: 2019-10-16 | Discharge: 2019-10-16 | Disposition: A | Discharge status: Home / Self Care | Payer: Medicare HMO | Source: Ambulatory Visit | Attending: Internal Medicine | Admitting: Internal Medicine

## 2019-10-16 ENCOUNTER — Encounter (HOSPITAL_COMMUNITY): Payer: Self-pay | Admitting: Internal Medicine

## 2019-10-16 VITALS — BP 106/60 | HR 64 | Ht 67.0 in | Wt 132.4 lb

## 2019-10-16 DIAGNOSIS — K219 Gastro-esophageal reflux disease without esophagitis: Secondary | ICD-10-CM | POA: Diagnosis not present

## 2019-10-16 DIAGNOSIS — Z7901 Long term (current) use of anticoagulants: Secondary | ICD-10-CM | POA: Insufficient documentation

## 2019-10-16 DIAGNOSIS — I5022 Chronic systolic (congestive) heart failure: Secondary | ICD-10-CM | POA: Diagnosis not present

## 2019-10-16 DIAGNOSIS — I2510 Atherosclerotic heart disease of native coronary artery without angina pectoris: Secondary | ICD-10-CM | POA: Diagnosis not present

## 2019-10-16 DIAGNOSIS — I255 Ischemic cardiomyopathy: Secondary | ICD-10-CM | POA: Diagnosis not present

## 2019-10-16 DIAGNOSIS — Z951 Presence of aortocoronary bypass graft: Secondary | ICD-10-CM | POA: Insufficient documentation

## 2019-10-16 DIAGNOSIS — I252 Old myocardial infarction: Secondary | ICD-10-CM | POA: Insufficient documentation

## 2019-10-16 DIAGNOSIS — Z8249 Family history of ischemic heart disease and other diseases of the circulatory system: Secondary | ICD-10-CM | POA: Insufficient documentation

## 2019-10-16 DIAGNOSIS — Z79899 Other long term (current) drug therapy: Secondary | ICD-10-CM | POA: Diagnosis not present

## 2019-10-16 DIAGNOSIS — K589 Irritable bowel syndrome without diarrhea: Secondary | ICD-10-CM | POA: Diagnosis not present

## 2019-10-16 DIAGNOSIS — I447 Left bundle-branch block, unspecified: Secondary | ICD-10-CM | POA: Diagnosis not present

## 2019-10-16 DIAGNOSIS — I480 Paroxysmal atrial fibrillation: Secondary | ICD-10-CM | POA: Insufficient documentation

## 2019-10-16 DIAGNOSIS — Z95810 Presence of automatic (implantable) cardiac defibrillator: Secondary | ICD-10-CM | POA: Diagnosis not present

## 2019-10-16 DIAGNOSIS — Z7984 Long term (current) use of oral hypoglycemic drugs: Secondary | ICD-10-CM | POA: Insufficient documentation

## 2019-10-16 LAB — BASIC METABOLIC PANEL
Anion gap: 9 (ref 5–15)
BUN: 12 mg/dL (ref 8–23)
CO2: 27 mmol/L (ref 22–32)
Calcium: 9.4 mg/dL (ref 8.9–10.3)
Chloride: 101 mmol/L (ref 98–111)
Creatinine, Ser: 0.89 mg/dL (ref 0.61–1.24)
GFR calc Af Amer: >60 mL/min (ref >60)
GFR calc non Af Amer: >60 mL/min (ref >60)
Glucose, Bld: 89 mg/dL (ref 70–99)
Potassium: 4 mmol/L (ref 3.5–5.1)
Sodium: 137 mmol/L (ref 135–145)

## 2019-10-16 LAB — BRAIN NATRIURETIC PEPTIDE: B Natriuretic Peptide: 1022.4 pg/mL — ABNORMAL HIGH (ref 0.0–100.0)

## 2019-10-16 NOTE — Patient Instructions (Signed)
Labs done today, your results will be available in MyChart, we will contact you for abnormal readings.  Your physician recommends that you schedule a follow-up appointment in: 4 months  If you have any questions or concerns before your next appointment please send us a message through mychart or call our office at [phone].    TO LEAVE A MESSAGE FOR THE NURSE SELECT OPTION 2, PLEASE LEAVE A MESSAGE INCLUDING: . YOUR NAME . DATE OF BIRTH . CALL BACK NUMBER . REASON FOR CALL**this is important as we prioritize the call backs  YOU WILL RECEIVE A CALL BACK THE SAME DAY AS LONG AS YOU CALL BEFORE 4:00 PM  At the [facility], you and your health needs are our priority. As part of our continuing mission to provide you with exceptional heart care, we have created designated Provider Care Teams. These Care Teams include your primary Cardiologist (physician) and Advanced Practice Providers (APPs- Physician Assistants and Nurse Practitioners) who all work together to provide you with the care you need, when you need it.   You may see any of the following providers on your designated Care Team at your next follow up: . Dr Daniel Bensimhon . Dr Dalton McLean . Amy Clegg, NP . Brittainy Simmons, PA . Lauren Kemp, PharmD   Please be sure to bring in all your medications bottles to every appointment.     

## 2019-10-22 ENCOUNTER — Ambulatory Visit (INDEPENDENT_AMBULATORY_CARE_PROVIDER_SITE_OTHER): Payer: Medicare HMO | Admitting: Emergency Medicine

## 2019-10-22 DIAGNOSIS — I472 Ventricular tachycardia: Secondary | ICD-10-CM | POA: Diagnosis not present

## 2019-10-22 DIAGNOSIS — I4729 Other ventricular tachycardia: Secondary | ICD-10-CM

## 2019-10-22 LAB — CUP PACEART REMOTE DEVICE CHECK
Battery Remaining Longevity: 48 mo
Battery Voltage: 2.96 V
Brady Statistic AP VP Percent: 92.63 %
Brady Statistic AP VS Percent: 1.59 %
Brady Statistic AS VP Percent: 5.52 %
Brady Statistic AS VS Percent: 0.26 %
Brady Statistic RA Percent Paced: 91.97 %
Brady Statistic RV Percent Paced: 1.11 %
Date Time Interrogation Session: 20210923022704
HighPow Impedance: 44 Ohm
HighPow Impedance: 76 Ohm
Implantable Lead Implant Date: 20010806
Implantable Lead Implant Date: 20180813
Implantable Lead Implant Date: 20180813
Implantable Lead Location: 753858
Implantable Lead Location: 753859
Implantable Lead Location: 753860
Implantable Lead Model: 147
Implantable Lead Model: 5092
Implantable Lead Serial Number: 104581
Implantable Pulse Generator Implant Date: 20180813
Lead Channel Impedance Value: 172.541
Lead Channel Impedance Value: 172.541
Lead Channel Impedance Value: 176 Ohm
Lead Channel Impedance Value: 204.14 Ohm
Lead Channel Impedance Value: 204.14 Ohm
Lead Channel Impedance Value: 304 Ohm
Lead Channel Impedance Value: 399 Ohm
Lead Channel Impedance Value: 399 Ohm
Lead Channel Impedance Value: 418 Ohm
Lead Channel Impedance Value: 475 Ohm
Lead Channel Impedance Value: 589 Ohm
Lead Channel Impedance Value: 608 Ohm
Lead Channel Impedance Value: 646 Ohm
Lead Channel Impedance Value: 646 Ohm
Lead Channel Impedance Value: 665 Ohm
Lead Channel Impedance Value: 703 Ohm
Lead Channel Impedance Value: 703 Ohm
Lead Channel Impedance Value: 722 Ohm
Lead Channel Pacing Threshold Amplitude: 0.625 V
Lead Channel Pacing Threshold Amplitude: 1.375 V
Lead Channel Pacing Threshold Amplitude: 1.75 V
Lead Channel Pacing Threshold Pulse Width: 0.4 ms
Lead Channel Pacing Threshold Pulse Width: 0.4 ms
Lead Channel Pacing Threshold Pulse Width: 0.4 ms
Lead Channel Sensing Intrinsic Amplitude: 2.25 mV
Lead Channel Sensing Intrinsic Amplitude: 2.25 mV
Lead Channel Sensing Intrinsic Amplitude: 8.375 mV
Lead Channel Sensing Intrinsic Amplitude: 8.375 mV
Lead Channel Setting Pacing Amplitude: 1.75 V
Lead Channel Setting Pacing Amplitude: 2 V
Lead Channel Setting Pacing Amplitude: 3.5 V
Lead Channel Setting Pacing Pulse Width: 0.4 ms
Lead Channel Setting Pacing Pulse Width: 0.4 ms
Lead Channel Setting Sensing Sensitivity: 0.3 mV

## 2019-10-26 LAB — ECHO TEE
MV M vel: 3.74 m/s
MV Peak grad: 56 mmHg
Radius: 0.7 cm

## 2019-10-27 NOTE — Progress Notes (Signed)
Remote ICD transmission.   

## 2019-10-28 DIAGNOSIS — I10 Essential (primary) hypertension: Secondary | ICD-10-CM | POA: Diagnosis not present

## 2019-10-28 DIAGNOSIS — E782 Mixed hyperlipidemia: Secondary | ICD-10-CM | POA: Diagnosis not present

## 2019-10-28 DIAGNOSIS — N400 Enlarged prostate without lower urinary tract symptoms: Secondary | ICD-10-CM | POA: Diagnosis not present

## 2019-10-28 DIAGNOSIS — J449 Chronic obstructive pulmonary disease, unspecified: Secondary | ICD-10-CM | POA: Diagnosis not present

## 2019-10-28 DIAGNOSIS — I5023 Acute on chronic systolic (congestive) heart failure: Secondary | ICD-10-CM | POA: Diagnosis not present

## 2019-10-28 DIAGNOSIS — I2510 Atherosclerotic heart disease of native coronary artery without angina pectoris: Secondary | ICD-10-CM | POA: Diagnosis not present

## 2019-10-28 DIAGNOSIS — E7800 Pure hypercholesterolemia, unspecified: Secondary | ICD-10-CM | POA: Diagnosis not present

## 2019-10-28 DIAGNOSIS — I480 Paroxysmal atrial fibrillation: Secondary | ICD-10-CM | POA: Diagnosis not present

## 2019-10-28 DIAGNOSIS — J45909 Unspecified asthma, uncomplicated: Secondary | ICD-10-CM | POA: Diagnosis not present

## 2019-11-02 ENCOUNTER — Telehealth: Payer: Self-pay | Admitting: Gastroenterology

## 2019-11-02 NOTE — Telephone Encounter (Signed)
Spoke with patient, he states that he has been constipated for 1 week and having upper abdominal pain. Pt reports that the abdominal pain is very uncomfortable, pt states that he had a BM this morning but it was only a small amount.  Pt states that he has not tried any stool softeners. Advised patient to try Miralax OTC, advised that he do 1-3 doses a day until his appt to see if he has any relief. Pt is scheduled to see Carl Best, NP on 11/04/19 at 8:30 am. Pt verbalized understanding of instructions.

## 2019-11-02 NOTE — Telephone Encounter (Signed)
Pt states that he has been dealing with constipation for about a week. He would like to be seen today. Pls call him.

## 2019-11-03 NOTE — Progress Notes (Signed)
11/03/2019 [name] [id number] [birth year]   Chief Complaint: reflux and constipation follow up  History of Present Illness: [name] is an 79 year old male with a past medical history of CAD, MI 45, CHF with LVEF 20%, ischemic cardiomyopathy, AICD, LBBB, s/p CABG 1998 and atrial fibrillation on Coumadin. He was last seen in our office by [name] on 08/25/2019 for IBS follow up. He was having some epigastric pain at that time. He declined a trial with TCA. H. Pylori IgG was negative. Omeprazole 20mg  was increased to bid without a noticeable improvement. He had some abdominal bloat so his fiber was switched from Metamucil to Citrucel. He sometimes feels SOB when bloated. No chest pain. He felt more constipated on Citrucel so he would like to switch back to Metamucil. He is passing a normal brown formed BM most days. He take Bentyl once or twice daily upper abdominal pain.  He complains of having heartburn 3 days within the past 2 weeks. He felt a little nauseous for one day. He uses Lactose free milk. He tried a FODMAP diet, IBGARD and Xifaxan without significant improvement regarding his abdominal bloat. CTAP 09/2018 showed a normal pancreas. No weight loss.  He underwent an EGD in 1996 without significant findings per the patient's report. His most recent colonoscopy was 11/04/2018, see results below.   Colonoscopy 11/04/18 -Ileal diverticulum. - Two colonic angiodysplastic lesions. - One 4 mm polyp at the hepatic flexure, removed with a cold snare. Resected and retrieved. - Diverticulosis in the entire examined colon. - Internal hemorrhoids. - The examination was otherwise normal. No overt inflammation - Biopsies were taken with a cold forceps from the right colon, left colon and transverse colon for evaluation of microscopic colitis.  Path showed adenomatous polyp, and biopsies negative for microscopic colitis   Abdominal/pelvic CT with contrast 10/04/2018: 1. No  acute findings. 2. Mildly enlarged prostate, with asymmetric enlargement of left seminal vesicle. 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 Recommend correlation with PSA level, and consider prostate MRI for further evaluation if clinically warranted. 3. Large left posterior bladder diverticulum. 4. Colonic diverticulosis, without radiographic evidence of diverticulitis. 5. Small left inguinal hernia containing only fat. 6. Cardiomegaly and findings consistent with right heart insufficiency.   CBC Latest Ref Rng & Units 07/13/2019 02/11/2019 03/24/2018  WBC 4.0 - 10.5 K/uL 7.7 7.9 9.7  Hemoglobin 13.0 - 17.0 g/dL 14.0 13.2 14.4  Hematocrit 39 - 52 % 44.6 41.1 46.3  Platelets 150 - 400 K/uL 252 348 261     CMP Latest Ref Rng & Units 10/16/2019 07/13/2019 05/18/2019  Glucose 70 - 99 mg/dL 89 98 86  BUN 8 - 23 mg/dL 12 14 15   Creatinine 0.61 - 1.24 mg/dL 0.89 0.82 0.88  Sodium 135 - 145 mmol/L 137 135 135  Potassium 3.5 - 5.1 mmol/L 4.0 3.8 3.9  Chloride 98 - 111 mmol/L 101 100 98  CO2 22 - 32 mmol/L 27 27 26   Calcium 8.9 - 10.3 mg/dL 9.4 9.1 9.1  Total Protein 6.5 - 8.1 g/dL - - -  Total Bilirubin 0.3 - 1.2 mg/dL - - -  Alkaline Phos 38 - 126 U/L - - -  AST 15 - 41 U/L - - -  ALT 0 - 44 U/L - - -   Colonoscopy 11/04/18:   -Ileal diverticulum. - Two colonic angiodysplastic lesions. - One 4 mm tubular adenomatous polyp at the hepatic flexure, removed with  a cold snare. Resected and retrieved. - Diverticulosis in the entire examined colon. - Internal hemorrhoids. - The examination was otherwise normal. No overt inflammation - Biopsies were taken with a cold forceps from the right colon, left colon and transverse. Biopsies were negative for microscopic colitis.   Current Outpatient Medications on File Prior to Visit  Medication Sig Dispense Refill  . acetaminophen (TYLENOL) 650 MG CR tablet Take 650 mg by mouth 2 (two) times daily.       Marland Kitchen allopurinol (ZYLOPRIM) 300 MG tablet Take 300 mg by mouth daily.    . bisoprolol (ZEBETA) 5 MG tablet Take 0.5 tablets (2.5 mg total) by mouth daily. 45 tablet 3  . cetirizine (ZYRTEC) 10 MG tablet Take 10 mg by mouth daily.      . Cholecalciferol (VITAMIN D) 2000 units CAPS Take 2,000-4,000 capsules by mouth See admin instructions. 4000 units MWF, 2000 units all other days    . dapagliflozin propanediol (FARXIGA) 10 MG TABS tablet Take 10 mg by mouth daily before breakfast. 30 tablet 6  . dicyclomine (BENTYL) 10 MG capsule Take 1 capsule (10 mg total) by mouth every 8 (eight) hours as needed for spasms. 180 capsule 0  . isosorbide mononitrate (IMDUR) 30 MG 24 hr tablet Take 0.5 tablets (15 mg total) by mouth daily. 45 tablet 3  . LORazepam (ATIVAN) 1 MG tablet Take 0.25-0.5 mg by mouth at bedtime.     Marland Kitchen losartan (COZAAR) 25 MG tablet TAKE ONE TABLET BY MOUTH TWICE A DAY 180 tablet 3  . magnesium oxide (MAG-OX) 400 MG tablet Take 400 mg by mouth daily at 12 noon.     . methylcellulose (CITRUCEL) oral powder Take as directed    . mometasone (ASMANEX) 220 MCG/INH inhaler Inhale 1 puff into the lungs daily.     . montelukast (SINGULAIR) 10 MG tablet Take 1 tablet (10 mg total) by mouth at bedtime. PLEASE SCHEDULE APPOINTMENT. 30 tablet 0  . Polyethyl Glycol-Propyl Glycol (SYSTANE OP) Apply 2-3 drops to eye 3 (three) times daily as needed (dry eyes).    . potassium chloride (KLOR-CON) 10 MEQ tablet Take 0.5 tablets (5 mEq total) by mouth 3 (three) times a week. Mon, Wed & Fri, can take extra as needed 45 tablet 3  . simvastatin (ZOCOR) 40 MG tablet Take 1 tablet (40 mg total) by mouth daily. 90 tablet 1  . spironolactone (ALDACTONE) 25 MG tablet Take 1 tablet (25 mg total) by mouth daily. 90 tablet 3  . tamsulosin (FLOMAX) 0.4 MG CAPS capsule Take 0.4 mg by mouth daily.    Marland Kitchen torsemide (DEMADEX) 20 MG tablet Take 1 tablet (20 mg total) by mouth 3 (three) times a week. Mon, Wed & Fri, can take extra  as needed 90 tablet 3  . warfarin (COUMADIN) 5 MG tablet Take 0.5-1 tablets (2.5-5 mg total) by mouth See admin instructions. Take 1/2 to 1 tablet by mouth daily as directed; Sun Tues Thurs 5 mg, all other days 2.5 mg 20 tablet 6   No current facility-administered medications on file prior to visit.   Allergies  Allergen Reactions  . Amoxicillin Rash    Has patient had a PCN reaction causing immediate rash, facial/tongue/throat swelling, SOB or lightheadedness with hypotension: Yes Has patient had a PCN reaction causing severe rash involving mucus membranes or skin necrosis: No Has patient had a PCN reaction that required hospitalization: No Has patient had a PCN reaction occurring within the last 10 years: No If all  of the above answers are "NO", then may proceed with Cephalosporin use.   Marland Kitchen Penicillins Rash    Has patient had a PCN reaction causing immediate rash, facial/tongue/throat swelling, SOB or lightheadedness with hypotension: Yes Has patient had a PCN reaction causing severe rash involving mucus membranes or skin necrosis: No Has patient had a PCN reaction that required hospitalization: No Has patient had a PCN reaction occurring within the last 10 years: No If all of the above answers are "NO", then may proceed with Cephalosporin use.      Current Medications, Allergies, Past Medical History, Past Surgical History, Family History and Social History were reviewed in [employer] record.   Review of Systems:   Constitutional: Negative for fever, sweats, chills or weight loss.  Respiratory: Negative for shortness of breath.   Cardiovascular: Negative for chest pain, palpitations and leg swelling.  Gastrointestinal: See HPI.  Musculoskeletal: Negative for back pain or muscle aches.  Neurological: Negative for dizziness, headaches or paresthesias.    Physical Exam: There were no vitals taken for this visit.   BP (!) 88/52 (BP Location: Right Arm,  Patient Position: Sitting, Cuff Size: Normal)   Pulse 81   Ht 5\' 7"  (1.702 m)   Wt 132 lb (59.9 kg)   BMI 20.67 kg/m   Wt Readings from Last 3 Encounters:  11/04/19 132 lb (59.9 kg)  10/16/19 132 lb 6.4 oz (60.1 kg)  08/25/19 133 lb (60.3 kg)   General: Well developed 79 year old male in no acute distress. Head: Normocephalic and atraumatic. Eyes: No scleral icterus. Conjunctiva pink . Ears: Normal auditory acuity. Lungs: Clear throughout to auscultation. Heart: Regular rate and rhythm, no murmur. Chest: AICD to left chest wall. Substernal scars intact.  Abdomen: Soft, nontender and nondistended. No masses or hepatomegaly. Normal bowel sounds x 4 quadrants.  Rectal: Deferred.  Musculoskeletal: Symmetrical with no gross deformities. Extremities: No edema. Neurological: Alert oriented x 4. No focal deficits.  Psychological: Alert and cooperative. Normal mood and affect  Assessment and Recommendations:  45. 79 year old male with IBS/abdominal bloat. Citrucel resulted in constipation. No improvement with past Xifaxan and Ibgard.  -Restart Metamucil as tolerated -Consider SIBO breath test if symptoms persist or worsen   2. GERD and epigastric pain. Nausea x 1 episode.  -Stop Omeprazole -Try Pantoprazole 20mg  po bid -FDgard 2 capsule po to be taken 30 minutes before or after meals bid -Consider EGD if his symptoms worsen   3. History of tubular adenomatous polyp -No further colonoscopies recommended due to age   78. CAD, CM with AICD, CHF,  afib on Coumadin   Follow up in the office in 4 months and as needed

## 2019-11-04 ENCOUNTER — Ambulatory Visit (INDEPENDENT_AMBULATORY_CARE_PROVIDER_SITE_OTHER): Payer: Medicare HMO | Admitting: Nurse Practitioner

## 2019-11-04 ENCOUNTER — Encounter: Payer: Self-pay | Admitting: Nurse Practitioner

## 2019-11-04 VITALS — BP 88/52 | HR 81 | Ht 67.0 in | Wt 132.0 lb

## 2019-11-04 DIAGNOSIS — K219 Gastro-esophageal reflux disease without esophagitis: Secondary | ICD-10-CM | POA: Diagnosis not present

## 2019-11-04 DIAGNOSIS — R140 Abdominal distension (gaseous): Secondary | ICD-10-CM

## 2019-11-04 MED ORDER — PANTOPRAZOLE SODIUM 20 MG PO TBEC: 20.0000 mg | DELAYED_RELEASE_TABLET | Freq: Two times a day (BID) | ORAL | 1 refills | Status: DC

## 2019-11-04 NOTE — Patient Instructions (Addendum)
If you are age 80 or older, your body mass index should be between 23-30. Your Body mass index is 20.67 kg/m. If this is out of the aforementioned range listed, please consider follow up with your Primary Care Provider.  If you are age 72 or younger, your body mass index should be between 19-25. Your Body mass index is 20.67 kg/m. If this is out of the aformentioned range listed, please consider follow up with your Primary Care Provider.   Discontinue your Omeprazole and Citracel   Start Pantoprazole 20 mg 1 tablet twice daily. Start over the counter FD guard 2 capsules 30 minutes before or after a meal twice a day Start metamucil again  Call the office if your symptoms worsen.  Due to recent changes in healthcare laws, you may see the results of your imaging and laboratory studies on MyChart before your provider has had a chance to review them.  We understand that in some cases there may be results that are confusing or concerning to you. Not all laboratory results come back in the same time frame and the provider may be waiting for multiple results in order to interpret others.  Please give [name] 48 hours in order for your provider to thoroughly review all the results before contacting the office for clarification of your results.   Thank you for choosing [facility] Noralyn Pick, CRNP  [phone]

## 2019-11-05 NOTE — Progress Notes (Signed)
Agree with assessment and plan as outlined.  

## 2019-11-06 DIAGNOSIS — I5023 Acute on chronic systolic (congestive) heart failure: Secondary | ICD-10-CM | POA: Diagnosis not present

## 2019-11-06 DIAGNOSIS — I10 Essential (primary) hypertension: Secondary | ICD-10-CM | POA: Diagnosis not present

## 2019-11-06 DIAGNOSIS — E782 Mixed hyperlipidemia: Secondary | ICD-10-CM | POA: Diagnosis not present

## 2019-11-06 DIAGNOSIS — J45909 Unspecified asthma, uncomplicated: Secondary | ICD-10-CM | POA: Diagnosis not present

## 2019-11-06 DIAGNOSIS — I480 Paroxysmal atrial fibrillation: Secondary | ICD-10-CM | POA: Diagnosis not present

## 2019-11-06 DIAGNOSIS — N400 Enlarged prostate without lower urinary tract symptoms: Secondary | ICD-10-CM | POA: Diagnosis not present

## 2019-11-06 DIAGNOSIS — I2510 Atherosclerotic heart disease of native coronary artery without angina pectoris: Secondary | ICD-10-CM | POA: Diagnosis not present

## 2019-11-06 DIAGNOSIS — J449 Chronic obstructive pulmonary disease, unspecified: Secondary | ICD-10-CM | POA: Diagnosis not present

## 2019-11-06 DIAGNOSIS — E7800 Pure hypercholesterolemia, unspecified: Secondary | ICD-10-CM | POA: Diagnosis not present

## 2019-11-09 ENCOUNTER — Ambulatory Visit (INDEPENDENT_AMBULATORY_CARE_PROVIDER_SITE_OTHER): Payer: Medicare HMO

## 2019-11-09 DIAGNOSIS — Z95810 Presence of automatic (implantable) cardiac defibrillator: Secondary | ICD-10-CM

## 2019-11-09 DIAGNOSIS — I5022 Chronic systolic (congestive) heart failure: Secondary | ICD-10-CM | POA: Diagnosis not present

## 2019-11-10 NOTE — Progress Notes (Signed)
EPIC Encounter for ICM Monitoring  Patient Name: [name] is a 79 y.o. male Date: 11/10/2019 Primary Care Physican: [name], MD Primary Cardiologist:Kelly/Bensimhon Electrophysiologist: [name] BiV Pacing:96.9% 10/12/2021Weight: 132-134lbs   Time in AT/AF 0.0 hr/day (0.0%) (on Coumadin)   Spoke with patient and reports feeling well at this time. Denies fluid symptoms.   Optivol thoracic impedancenormal.  Prescribed:  Torsemide 20 mg 1 tablettree times a week Mon, Wed &Friday. Can take extra as needed  Potassium 10 mEq take 0.5 tabletthree times a week Mon, Wed &Friday. Can take extra as needed  Spironolactone 25 mg take 1 table daily  Labs: 10/16/2019 Creatinine 0.89, BUN 12, Potassium 4.0, Sodium 137, GFR>60 07/13/2019 Creatinine 0.82, BUN 14, Potassium 3.8, Sodium 135, GFR >60 05/18/2019 Creatinine 0.88, BUN 15, Potassium 3.9, Sodium 135, GFR >60 05/11/2019 Creatinine 0.92, BUN 22, Potassium 3.4, Sodium 137, GFR 79-91 01/13/2021Creatinine0.92, BUN 16, Potassium4.6, Sodium 134, GFR >60 A complete set of results can be found in Results Review  Recommendations:No changes and encouraged to call if experiencing any fluid symptoms.  Follow-up plan: ICM clinic phone appointment on11/16/2021. 91 day device clinic remote transmission 01/21/2020.   EP/Cardiology Office Visits:02/15/2020 with [place].   Copy of ICM check sent to [facility].  3 month ICM trend: 11/09/2019    1 Year ICM trend:       [name], RN 11/10/2019 11:15 AM

## 2019-11-11 ENCOUNTER — Ambulatory Visit (INDEPENDENT_AMBULATORY_CARE_PROVIDER_SITE_OTHER): Payer: Medicare HMO

## 2019-11-11 ENCOUNTER — Other Ambulatory Visit: Payer: Self-pay

## 2019-11-11 DIAGNOSIS — I480 Paroxysmal atrial fibrillation: Secondary | ICD-10-CM

## 2019-11-11 DIAGNOSIS — Z5181 Encounter for therapeutic drug level monitoring: Secondary | ICD-10-CM

## 2019-11-11 DIAGNOSIS — I4891 Unspecified atrial fibrillation: Secondary | ICD-10-CM

## 2019-11-11 DIAGNOSIS — Z7901 Long term (current) use of anticoagulants: Secondary | ICD-10-CM

## 2019-11-11 LAB — POCT INR: INR: 3 (ref 2.0–3.0)

## 2019-11-11 NOTE — Patient Instructions (Signed)
Continue taking 1 tablet daily except 1/2 tablet each Monday, Wednesday and Friday.  Repeat INR in 6 weeks

## 2019-11-18 DIAGNOSIS — Z23 Encounter for immunization: Secondary | ICD-10-CM | POA: Diagnosis not present

## 2019-11-24 ENCOUNTER — Other Ambulatory Visit: Payer: Self-pay

## 2019-11-24 ENCOUNTER — Other Ambulatory Visit: Payer: Self-pay | Admitting: Cardiovascular Disease

## 2019-11-24 MED ORDER — DICYCLOMINE HCL 10 MG PO CAPS: 10.0000 mg | ORAL_CAPSULE | Freq: Three times a day (TID) | ORAL | 0 refills | Status: DC | PRN

## 2019-11-24 NOTE — Progress Notes (Signed)
Refill of bentyl sent to [facility]

## 2019-11-30 ENCOUNTER — Telehealth (HOSPITAL_COMMUNITY): Payer: Self-pay | Admitting: Pharmacy Technician

## 2019-11-30 DIAGNOSIS — I2510 Atherosclerotic heart disease of native coronary artery without angina pectoris: Secondary | ICD-10-CM | POA: Diagnosis not present

## 2019-11-30 DIAGNOSIS — E782 Mixed hyperlipidemia: Secondary | ICD-10-CM | POA: Diagnosis not present

## 2019-11-30 DIAGNOSIS — I480 Paroxysmal atrial fibrillation: Secondary | ICD-10-CM | POA: Diagnosis not present

## 2019-11-30 DIAGNOSIS — N400 Enlarged prostate without lower urinary tract symptoms: Secondary | ICD-10-CM | POA: Diagnosis not present

## 2019-11-30 DIAGNOSIS — K219 Gastro-esophageal reflux disease without esophagitis: Secondary | ICD-10-CM | POA: Diagnosis not present

## 2019-11-30 DIAGNOSIS — J45909 Unspecified asthma, uncomplicated: Secondary | ICD-10-CM | POA: Diagnosis not present

## 2019-11-30 DIAGNOSIS — I5023 Acute on chronic systolic (congestive) heart failure: Secondary | ICD-10-CM | POA: Diagnosis not present

## 2019-11-30 DIAGNOSIS — J449 Chronic obstructive pulmonary disease, unspecified: Secondary | ICD-10-CM | POA: Diagnosis not present

## 2019-11-30 DIAGNOSIS — I10 Essential (primary) hypertension: Secondary | ICD-10-CM | POA: Diagnosis not present

## 2019-11-30 NOTE — Telephone Encounter (Signed)
Estill Bamberg from [facility]) called to inquire about the patient's Farxiga. Patient was previously approved for AZ&Me assistance through 05/19/20. Called Amanda at [phone] and relayed the information.  [name], CPhT

## 2019-12-15 ENCOUNTER — Ambulatory Visit (INDEPENDENT_AMBULATORY_CARE_PROVIDER_SITE_OTHER): Payer: Medicare HMO

## 2019-12-15 ENCOUNTER — Telehealth (HOSPITAL_COMMUNITY): Payer: Self-pay | Admitting: *Deleted

## 2019-12-15 DIAGNOSIS — I5022 Chronic systolic (congestive) heart failure: Secondary | ICD-10-CM | POA: Diagnosis not present

## 2019-12-15 DIAGNOSIS — Z95810 Presence of automatic (implantable) cardiac defibrillator: Secondary | ICD-10-CM | POA: Diagnosis not present

## 2019-12-15 NOTE — Telephone Encounter (Signed)
Pt left message with the scheduling line that he was more short of breath and requested a return call. I called pt back no answer/left vm requesting a return call.

## 2019-12-16 ENCOUNTER — Telehealth (HOSPITAL_COMMUNITY): Payer: Self-pay

## 2019-12-16 NOTE — Telephone Encounter (Signed)
Please get remote intterrogation of his device to see fluid status.   Margarita Grizzle short RN cc'd to get Engineer, manufacturing.

## 2019-12-16 NOTE — Progress Notes (Signed)
EPIC Encounter for ICM Monitoring  Patient Name: [name] is a 79 y.o. male Date: 12/16/2019 Primary Care Physican: [name], MD Primary Cardiologist:Kelly/Bensimhon Electrophysiologist: [name] BiV Pacing:96.3% 10/12/2021Weight: 132-134lbs  Time in AT/AF 0.0 hr/day (0.0%) (on Coumadin)   Spoke with patient and he called Dr Clayborne Dana office today, 12/16/2019 to report shortness of breath and sleeping with head of bed elevated to breathe better.   He has been eating a lot of restaurant foods due to house remodeling and not cooking at home.  He has taken only 1 extra Torsemide/Potassium in the last 2 weeks due to symptoms.   Optivol thoracic impedanceis close to baseline normal but was suggesting possible fluid accumulation starting 10/30 until remote transmission date of 12/15/2019.  Prescribed:  Torsemide 20 mg 1 tablettree times a week Mon, Wed &Friday. Can take extra as needed  Potassium 10 mEq take 0.5 tabletthree times a week Mon, Wed &Friday. Can take extra as needed  Spironolactone 25 mg take 1 table daily  Labs: 10/16/2019 Creatinine 0.89, BUN 12, Potassium 4.0, Sodium 137, GFR>60 07/13/2019 Creatinine 0.82, BUN 14, Potassium 3.8, Sodium 135, GFR >60 05/18/2019 Creatinine 0.88, BUN 15, Potassium 3.9, Sodium 135, GFR >60 05/11/2019 Creatinine 0.92, BUN 22, Potassium 3.4, Sodium 137, GFR 79-91 01/13/2021Creatinine0.92, BUN 16, Potassium4.6, Sodium 134, GFR >60 A complete set of results can be found in Results Review  Recommendations:Patient is waiting for call back from HF clinic today with recommendations from Dr [name].   Advised for future, if he becomes symptomatic again with SOB then he can take extra Torsemide and Potassium as prescribed for 4-5 days consecutively and if symptoms do not resolve after taking extra then call the office.   Follow-up plan: ICM clinic phone appointment  on12/20/2021. 91 day device clinic remote transmission 01/21/2020.   EP/Cardiology Office Visits:02/15/2020 with [place].   Copy of ICM check sent to Dr.Klein and Dr [name].  3 month ICM trend: 12/15/2019    1 Year ICM trend:       [name], RN 12/16/2019 12:34 PM

## 2019-12-16 NOTE — Telephone Encounter (Addendum)
Dr [name],  I cc'd my note to you with the Optivol reading.  Please seem my ICM note.  Thanks.

## 2019-12-16 NOTE — Telephone Encounter (Signed)
Patient called to report that he has been experiencing increased shortness of breath that's been off and on for the past two weeks. He denies weight gain,swelling,dizziness,chest pain. He also reported that sometimes he has to sleep with his head elevated due to feeling uncomfortable laying flat at night. Please advise

## 2019-12-17 NOTE — Telephone Encounter (Signed)
Patient advised and verbalized understanding 

## 2019-12-17 NOTE — Telephone Encounter (Signed)
Philicia - can you please double torsemide for 2 days and see if it helps. His fluid is up. Thanks -dan

## 2019-12-18 NOTE — Progress Notes (Signed)
Branch, Philicia R, CMA 17 hours ago (4:17 PM)     Patient advised and verbalized understanding.             Bensimhon, Shaune Pascal, MD  You; Branch, [place], Gary (5:52 AM)     Philicia - can you please double torsemide for 2 days and see if it helps. His fluid is up. Thanks -dan      Documentation

## 2019-12-23 ENCOUNTER — Ambulatory Visit (INDEPENDENT_AMBULATORY_CARE_PROVIDER_SITE_OTHER): Payer: Medicare HMO

## 2019-12-23 ENCOUNTER — Other Ambulatory Visit: Payer: Self-pay

## 2019-12-23 DIAGNOSIS — I4891 Unspecified atrial fibrillation: Secondary | ICD-10-CM | POA: Diagnosis not present

## 2019-12-23 DIAGNOSIS — I480 Paroxysmal atrial fibrillation: Secondary | ICD-10-CM

## 2019-12-23 DIAGNOSIS — Z7901 Long term (current) use of anticoagulants: Secondary | ICD-10-CM | POA: Diagnosis not present

## 2019-12-23 DIAGNOSIS — Z5181 Encounter for therapeutic drug level monitoring: Secondary | ICD-10-CM

## 2019-12-23 LAB — POCT INR: INR: 2.8 (ref 2.0–3.0)

## 2019-12-23 NOTE — Patient Instructions (Signed)
Continue taking 1 tablet daily except 1/2 tablet each Monday, Wednesday and Friday.  Repeat INR in 6 weeks

## 2019-12-29 DIAGNOSIS — K4090 Unilateral inguinal hernia, without obstruction or gangrene, not specified as recurrent: Secondary | ICD-10-CM | POA: Diagnosis not present

## 2019-12-29 DIAGNOSIS — I10 Essential (primary) hypertension: Secondary | ICD-10-CM | POA: Diagnosis not present

## 2020-01-04 ENCOUNTER — Other Ambulatory Visit: Payer: Self-pay | Admitting: Gastroenterology

## 2020-01-18 ENCOUNTER — Ambulatory Visit (INDEPENDENT_AMBULATORY_CARE_PROVIDER_SITE_OTHER): Payer: Medicare HMO

## 2020-01-18 DIAGNOSIS — I5022 Chronic systolic (congestive) heart failure: Secondary | ICD-10-CM | POA: Diagnosis not present

## 2020-01-18 DIAGNOSIS — Z95810 Presence of automatic (implantable) cardiac defibrillator: Secondary | ICD-10-CM | POA: Diagnosis not present

## 2020-01-18 NOTE — Progress Notes (Signed)
EPIC Encounter for ICM Monitoring  Patient Name: [name] is a 79 y.o. male Date: 01/18/2020 Primary Care Physican: [name], MD Primary Cardiologist:Kelly/Bensimhon Electrophysiologist: [name] BiV Pacing:97.5% 12/20/2021Weight: 132-134lbs  Time in AT/AF 0.0 hr/day (0.0%) (on Coumadin)   Spoke with patient and reports feeling bloated.    Optivol thoracic impedancesuggesting possible fluid accumulation.  Prescribed:  Torsemide 20 mg 1 tablettree times a week Mon, Wed &Friday. Can take extra as needed  Potassium 10 mEq take 0.5 tabletthree times a week Mon, Wed &Friday. Can take extra as needed  Spironolactone 25 mg take 1 table daily  Labs: 10/16/2019 Creatinine 0.89, BUN 12, Potassium 4.0, Sodium 137, GFR>60 07/13/2019 Creatinine 0.82, BUN 14, Potassium 3.8, Sodium 135, GFR >60 05/18/2019 Creatinine 0.88, BUN 15, Potassium 3.9, Sodium 135, GFR >60 05/11/2019 Creatinine 0.92, BUN 22, Potassium 3.4, Sodium 137, GFR 79-91 01/13/2021Creatinine0.92, BUN 16, Potassium4.6, Sodium 134, GFR >60 A complete set of results can be found in Results Review  Recommendations:Advised take extra Torsemide and Potassium on Tuesday this week along with prescribed days of Mon, Wed and Friday.   Follow-up plan: ICM clinic phone appointment on12/27/2021 (manual) to recheck fluid levels. 91 day device clinic remote transmission12/23/2021.   EP/Cardiology Office Visits:1/17/2022with Dr.Benismhon.   Copy of ICM check sent to Dr.Klein and Dr [name].  3 month ICM trend: 01/18/2020    1 Year ICM trend:       [name], RN 01/18/2020 9:48 AM

## 2020-01-21 ENCOUNTER — Ambulatory Visit (INDEPENDENT_AMBULATORY_CARE_PROVIDER_SITE_OTHER): Payer: Medicare HMO

## 2020-01-21 DIAGNOSIS — I255 Ischemic cardiomyopathy: Secondary | ICD-10-CM

## 2020-01-21 LAB — CUP PACEART REMOTE DEVICE CHECK
Battery Remaining Longevity: 42 mo
Battery Voltage: 2.96 V
Brady Statistic AP VP Percent: 93.77 %
Brady Statistic AP VS Percent: 1.63 %
Brady Statistic AS VP Percent: 4.45 %
Brady Statistic AS VS Percent: 0.15 %
Brady Statistic RA Percent Paced: 94.65 %
Brady Statistic RV Percent Paced: 0.31 %
Date Time Interrogation Session: 20211223001709
HighPow Impedance: 42 Ohm
HighPow Impedance: 76 Ohm
Implantable Lead Implant Date: 20010806
Implantable Lead Implant Date: 20180813
Implantable Lead Implant Date: 20180813
Implantable Lead Location: 753858
Implantable Lead Location: 753859
Implantable Lead Location: 753860
Implantable Lead Model: 147
Implantable Lead Model: 5092
Implantable Lead Serial Number: 104581
Implantable Pulse Generator Implant Date: 20180813
Lead Channel Impedance Value: 165.029
Lead Channel Impedance Value: 172.541
Lead Channel Impedance Value: 176 Ohm
Lead Channel Impedance Value: 193.707
Lead Channel Impedance Value: 204.14 Ohm
Lead Channel Impedance Value: 304 Ohm
Lead Channel Impedance Value: 361 Ohm
Lead Channel Impedance Value: 399 Ohm
Lead Channel Impedance Value: 418 Ohm
Lead Channel Impedance Value: 456 Ohm
Lead Channel Impedance Value: 589 Ohm
Lead Channel Impedance Value: 589 Ohm
Lead Channel Impedance Value: 589 Ohm
Lead Channel Impedance Value: 646 Ohm
Lead Channel Impedance Value: 665 Ohm
Lead Channel Impedance Value: 703 Ohm
Lead Channel Impedance Value: 703 Ohm
Lead Channel Impedance Value: 722 Ohm
Lead Channel Pacing Threshold Amplitude: 0.75 V
Lead Channel Pacing Threshold Amplitude: 1.125 V
Lead Channel Pacing Threshold Amplitude: 1.75 V
Lead Channel Pacing Threshold Pulse Width: 0.4 ms
Lead Channel Pacing Threshold Pulse Width: 0.4 ms
Lead Channel Pacing Threshold Pulse Width: 0.4 ms
Lead Channel Sensing Intrinsic Amplitude: 13.75 mV
Lead Channel Sensing Intrinsic Amplitude: 13.75 mV
Lead Channel Sensing Intrinsic Amplitude: 2.125 mV
Lead Channel Sensing Intrinsic Amplitude: 2.125 mV
Lead Channel Setting Pacing Amplitude: 1.75 V
Lead Channel Setting Pacing Amplitude: 2 V
Lead Channel Setting Pacing Amplitude: 3.5 V
Lead Channel Setting Pacing Pulse Width: 0.4 ms
Lead Channel Setting Pacing Pulse Width: 0.4 ms
Lead Channel Setting Sensing Sensitivity: 0.3 mV

## 2020-01-25 ENCOUNTER — Ambulatory Visit (INDEPENDENT_AMBULATORY_CARE_PROVIDER_SITE_OTHER): Payer: Medicare HMO

## 2020-01-25 DIAGNOSIS — I5022 Chronic systolic (congestive) heart failure: Secondary | ICD-10-CM

## 2020-01-25 DIAGNOSIS — Z95810 Presence of automatic (implantable) cardiac defibrillator: Secondary | ICD-10-CM

## 2020-01-25 NOTE — Progress Notes (Signed)
EPIC Encounter for ICM Monitoring  Patient Name: [name] is a 79 y.o. male Date: 01/25/2020 Primary Care Physican: [name], MD Primary Cardiologist:Kelly/Bensimhon Electrophysiologist: Graciela Husbands BiV Pacing:97.5% 12/20/2021Weight: 132-134lbs  Time in AT/AF 0.0 hr/day (0.0%) (on Coumadin)   Spoke with patient and reports feeling well at this time.  Denies fluid symptoms.  Discussed at length regarding reading food labels and limiting salt intake to 2000 mg daily.  Also advised to avoid restaurant foods.   Optivol thoracic impedancesuggesting possible fluid accumulation.  Prescribed:  Torsemide 20 mg 1 tablettree times a week Mon, Wed &Friday. Can take extra as needed  Potassium 10 mEq take 0.5 tabletthree times a week Mon, Wed &Friday. Can take extra as needed  Spironolactone 25 mg take 1 table daily  Labs: 10/16/2019 Creatinine 0.89, BUN 12, Potassium 4.0, Sodium 137, GFR>60 07/13/2019 Creatinine 0.82, BUN 14, Potassium 3.8, Sodium 135, GFR >60 05/18/2019 Creatinine 0.88, BUN 15, Potassium 3.9, Sodium 135, GFR >60 05/11/2019 Creatinine 0.92, BUN 22, Potassium 3.4, Sodium 137, GFR 79-91 01/13/2021Creatinine0.92, BUN 16, Potassium4.6, Sodium 134, GFR >60 A complete set of results can be found in Results Review  Recommendations:Advised take 2 Torsemide on Wednesday with 1 Potassium then resume prescribed dosage.  Follow-up plan: ICM clinic phone appointment on1/03/2020 to recheck fluid levels. 91 day device clinic remote transmission3/04/2020.   EP/Cardiology Office Visits:1/17/2022with Dr.Benismhon.   Copy of ICM check sent to Dr.Kleinand Dr Gala Romney.  3 month ICM trend: 01/25/2020    1 Year ICM trend:       Karie Soda, RN 01/25/2020 11:43 AM

## 2020-01-26 ENCOUNTER — Other Ambulatory Visit (HOSPITAL_COMMUNITY): Payer: Self-pay | Admitting: Internal Medicine

## 2020-02-01 ENCOUNTER — Ambulatory Visit (INDEPENDENT_AMBULATORY_CARE_PROVIDER_SITE_OTHER): Payer: Medicare HMO

## 2020-02-01 DIAGNOSIS — Z95810 Presence of automatic (implantable) cardiac defibrillator: Secondary | ICD-10-CM

## 2020-02-01 DIAGNOSIS — I5022 Chronic systolic (congestive) heart failure: Secondary | ICD-10-CM

## 2020-02-02 NOTE — Progress Notes (Signed)
EPIC Encounter for ICM Monitoring  Patient Name: [name] is a 80 y.o. male Date: 02/02/2020 Primary Care Physican: [name], MD Primary Cardiologist:Kelly/Bensimhon Electrophysiologist: Graciela Husbands BiV Pacing:97.2% 12/20/2021Weight: 132-134lbs  Time in AT/AF 0.0 hr/day (0.0%) (on Coumadin)   Spoke with patient and reports feeling well at this time.  Denies fluid symptoms.    Optivol thoracic impedancesuggesting fluid levels returned normal after taking extra Torsemide.  Prescribed:  Torsemide 20 mg 1 tablettree times a week Mon, Wed &Friday. Can take extra as needed  Potassium 10 mEq take 0.5 tabletthree times a week Mon, Wed &Friday. Can take extra as needed  Spironolactone 25 mg take 1 table daily  Labs: 10/16/2019 Creatinine 0.89, BUN 12, Potassium 4.0, Sodium 137, GFR>60 07/13/2019 Creatinine 0.82, BUN 14, Potassium 3.8, Sodium 135, GFR >60 05/18/2019 Creatinine 0.88, BUN 15, Potassium 3.9, Sodium 135, GFR >60 05/11/2019 Creatinine 0.92, BUN 22, Potassium 3.4, Sodium 137, GFR 79-91 01/13/2021Creatinine0.92, BUN 16, Potassium4.6, Sodium 134, GFR >60 A complete set of results can be found in Results Review  Recommendations:No changes and encouraged to call if experiencing any fluid symptoms.  Follow-up plan: ICM clinic phone appointment on2/07/2020. 91 day device clinic remote transmission3/04/2020.   EP/Cardiology Office Visits:1/17/2022with Dr.Benismhon. Advised to call the office to schedule overdue appointment with Dr Graciela Husbands (last visit was 09/2018).  Copy of ICM check sent to Dr.Kleinand Dr Gala Romney.  3 month ICM trend: 02/01/2020.    1 Year ICM trend:       Karie Soda, RN 02/02/2020 4:23 PM

## 2020-02-03 ENCOUNTER — Ambulatory Visit (INDEPENDENT_AMBULATORY_CARE_PROVIDER_SITE_OTHER): Payer: Medicare HMO

## 2020-02-03 ENCOUNTER — Other Ambulatory Visit: Payer: Self-pay

## 2020-02-03 DIAGNOSIS — I480 Paroxysmal atrial fibrillation: Secondary | ICD-10-CM | POA: Diagnosis not present

## 2020-02-03 DIAGNOSIS — Z5181 Encounter for therapeutic drug level monitoring: Secondary | ICD-10-CM | POA: Diagnosis not present

## 2020-02-03 DIAGNOSIS — Z7901 Long term (current) use of anticoagulants: Secondary | ICD-10-CM

## 2020-02-03 DIAGNOSIS — I4891 Unspecified atrial fibrillation: Secondary | ICD-10-CM | POA: Diagnosis not present

## 2020-02-03 LAB — POCT INR: INR: 2.4 (ref 2.0–3.0)

## 2020-02-03 NOTE — Progress Notes (Signed)
Remote ICD transmission.   

## 2020-02-03 NOTE — Patient Instructions (Signed)
Continue taking 1 tablet daily except 1/2 tablet each Monday, Wednesday and Friday.  Repeat INR in 6 weeks

## 2020-02-09 ENCOUNTER — Telehealth (HOSPITAL_COMMUNITY): Payer: Self-pay | Admitting: Pharmacy Technician

## 2020-02-09 NOTE — Telephone Encounter (Signed)
Advanced Heart Failure Patient Advocate Encounter  Prior Authorization for Wilder Glade has been approved.    Effective dates: 02/04/20 through 01/28/21  Patients co-pay is $45  [name], CPhT

## 2020-02-10 ENCOUNTER — Telehealth (HOSPITAL_COMMUNITY): Payer: Self-pay | Admitting: Pharmacist

## 2020-02-10 ENCOUNTER — Telehealth (HOSPITAL_COMMUNITY): Payer: Self-pay | Admitting: Pharmacy Technician

## 2020-02-10 MED ORDER — DAPAGLIFLOZIN PROPANEDIOL 10 MG PO TABS
10.0000 mg | ORAL_TABLET | Freq: Every day | ORAL | 3 refills | Status: DC
Start: 2020-02-10 — End: 2021-02-07

## 2020-02-10 NOTE — Telephone Encounter (Signed)
Farxiga refill sent to [employer].  Ronnell Clinger, PharmD, BCPS, BCCP, CPP Heart Failure Clinic Pharmacist [phone]  

## 2020-02-10 NOTE — Telephone Encounter (Signed)
Spoke with patient today regarding [place]. He currently has AZ[employer]Me assistance until April. He needed an updated script sent to the company.  Asked Lauren, RPH to send that in.   Patient currently on last dose of medication. It takes them some time to get the refill to the patient, so I was able to provide a 2 week sample for him.   LOT [id number] EXP 7/24  Advised the patient to call in the future with any issues.  [name], CPhT

## 2020-02-11 ENCOUNTER — Other Ambulatory Visit (HOSPITAL_COMMUNITY): Payer: Self-pay | Admitting: Internal Medicine

## 2020-02-12 ENCOUNTER — Other Ambulatory Visit (HOSPITAL_COMMUNITY): Payer: Self-pay | Admitting: Cardiology

## 2020-02-12 DIAGNOSIS — M109 Gout, unspecified: Secondary | ICD-10-CM | POA: Diagnosis not present

## 2020-02-12 DIAGNOSIS — I509 Heart failure, unspecified: Secondary | ICD-10-CM | POA: Diagnosis not present

## 2020-02-12 DIAGNOSIS — M25561 Pain in right knee: Secondary | ICD-10-CM | POA: Diagnosis not present

## 2020-02-12 DIAGNOSIS — M1990 Unspecified osteoarthritis, unspecified site: Secondary | ICD-10-CM | POA: Diagnosis not present

## 2020-02-12 DIAGNOSIS — M1120 Other chondrocalcinosis, unspecified site: Secondary | ICD-10-CM | POA: Diagnosis not present

## 2020-02-12 DIAGNOSIS — M542 Cervicalgia: Secondary | ICD-10-CM | POA: Diagnosis not present

## 2020-02-12 DIAGNOSIS — Z79899 Other long term (current) drug therapy: Secondary | ICD-10-CM | POA: Diagnosis not present

## 2020-02-12 MED ORDER — POTASSIUM CHLORIDE CRYS ER 10 MEQ PO TBCR: EXTENDED_RELEASE_TABLET | ORAL | 0 refills | Status: DC

## 2020-02-13 ENCOUNTER — Other Ambulatory Visit (HOSPITAL_COMMUNITY): Payer: Self-pay | Admitting: Internal Medicine

## 2020-02-15 ENCOUNTER — Encounter (HOSPITAL_COMMUNITY): Payer: Medicare HMO | Admitting: Internal Medicine

## 2020-02-17 ENCOUNTER — Other Ambulatory Visit (HOSPITAL_COMMUNITY): Payer: Self-pay

## 2020-02-17 DIAGNOSIS — I10 Essential (primary) hypertension: Secondary | ICD-10-CM | POA: Diagnosis not present

## 2020-02-17 DIAGNOSIS — E782 Mixed hyperlipidemia: Secondary | ICD-10-CM | POA: Diagnosis not present

## 2020-02-17 DIAGNOSIS — K219 Gastro-esophageal reflux disease without esophagitis: Secondary | ICD-10-CM | POA: Diagnosis not present

## 2020-02-17 DIAGNOSIS — I2510 Atherosclerotic heart disease of native coronary artery without angina pectoris: Secondary | ICD-10-CM | POA: Diagnosis not present

## 2020-02-17 DIAGNOSIS — I480 Paroxysmal atrial fibrillation: Secondary | ICD-10-CM | POA: Diagnosis not present

## 2020-02-17 DIAGNOSIS — J45909 Unspecified asthma, uncomplicated: Secondary | ICD-10-CM | POA: Diagnosis not present

## 2020-02-17 DIAGNOSIS — J449 Chronic obstructive pulmonary disease, unspecified: Secondary | ICD-10-CM | POA: Diagnosis not present

## 2020-02-17 DIAGNOSIS — N400 Enlarged prostate without lower urinary tract symptoms: Secondary | ICD-10-CM | POA: Diagnosis not present

## 2020-02-17 DIAGNOSIS — I5023 Acute on chronic systolic (congestive) heart failure: Secondary | ICD-10-CM | POA: Diagnosis not present

## 2020-02-17 MED ORDER — ISOSORBIDE MONONITRATE ER 30 MG PO TB24
15.0000 mg | ORAL_TABLET | Freq: Every day | ORAL | 3 refills | Status: DC
Start: 2020-02-17 — End: 2020-02-18

## 2020-02-17 MED ORDER — POTASSIUM CHLORIDE CRYS ER 10 MEQ PO TBCR: EXTENDED_RELEASE_TABLET | ORAL | 0 refills | Status: DC

## 2020-02-17 MED ORDER — DICYCLOMINE HCL 10 MG PO CAPS: ORAL_CAPSULE | ORAL | 2 refills | Status: DC

## 2020-02-17 NOTE — Addendum Note (Signed)
Addended by: Malena Edman on: 02/17/2020 02:32 PM   Modules accepted: Orders

## 2020-02-18 ENCOUNTER — Other Ambulatory Visit (HOSPITAL_COMMUNITY): Payer: Self-pay

## 2020-02-18 DIAGNOSIS — Z961 Presence of intraocular lens: Secondary | ICD-10-CM | POA: Diagnosis not present

## 2020-02-18 DIAGNOSIS — H353211 Exudative age-related macular degeneration, right eye, with active choroidal neovascularization: Secondary | ICD-10-CM | POA: Diagnosis not present

## 2020-02-18 DIAGNOSIS — H353120 Nonexudative age-related macular degeneration, left eye, stage unspecified: Secondary | ICD-10-CM | POA: Diagnosis not present

## 2020-02-18 DIAGNOSIS — H469 Unspecified optic neuritis: Secondary | ICD-10-CM | POA: Diagnosis not present

## 2020-02-18 MED ORDER — ISOSORBIDE MONONITRATE ER 30 MG PO TB24
15.0000 mg | ORAL_TABLET | Freq: Every day | ORAL | 0 refills | Status: DC
Start: 2020-02-18 — End: 2021-03-02

## 2020-02-18 MED ORDER — DICYCLOMINE HCL 10 MG PO CAPS: ORAL_CAPSULE | ORAL | 2 refills | Status: DC

## 2020-02-19 ENCOUNTER — Other Ambulatory Visit: Payer: Self-pay | Admitting: Surgery

## 2020-02-19 DIAGNOSIS — K4090 Unilateral inguinal hernia, without obstruction or gangrene, not specified as recurrent: Secondary | ICD-10-CM | POA: Diagnosis not present

## 2020-02-24 ENCOUNTER — Telehealth (HOSPITAL_COMMUNITY): Payer: Self-pay | Admitting: Pharmacist

## 2020-02-24 NOTE — Telephone Encounter (Signed)
Spoke with patient. He has not received a refill for his Wilder Glade from [employer]. I called and the company confirmed receipt of the prescription but stated that patient had to call and ask for refill. I gave their number to patient. Also will give patient 2 weeks of Farxiga 10 mg samples to hold him until he gets the shipment.   LOT [id number] EXP 7/24  Audry Riles, PharmD, BCPS, BCCP, CPP Heart Failure Clinic Pharmacist [phone]

## 2020-02-25 ENCOUNTER — Other Ambulatory Visit (HOSPITAL_COMMUNITY): Payer: Self-pay

## 2020-02-25 MED ORDER — POTASSIUM CHLORIDE CRYS ER 10 MEQ PO TBCR: EXTENDED_RELEASE_TABLET | ORAL | 0 refills | Status: DC

## 2020-02-27 ENCOUNTER — Other Ambulatory Visit: Payer: Self-pay | Admitting: Internal Medicine

## 2020-03-02 ENCOUNTER — Ambulatory Visit (HOSPITAL_COMMUNITY)
Admission: RE | Admit: 2020-03-02 | Discharge: 2020-03-02 | Disposition: A | Discharge status: Home / Self Care | Payer: Medicare HMO | Source: Ambulatory Visit | Attending: Internal Medicine | Admitting: Internal Medicine

## 2020-03-02 ENCOUNTER — Other Ambulatory Visit: Payer: Self-pay

## 2020-03-02 VITALS — BP 110/68 | HR 69 | Wt 134.6 lb

## 2020-03-02 DIAGNOSIS — I2510 Atherosclerotic heart disease of native coronary artery without angina pectoris: Secondary | ICD-10-CM | POA: Insufficient documentation

## 2020-03-02 DIAGNOSIS — Z7901 Long term (current) use of anticoagulants: Secondary | ICD-10-CM | POA: Diagnosis not present

## 2020-03-02 DIAGNOSIS — I480 Paroxysmal atrial fibrillation: Secondary | ICD-10-CM | POA: Insufficient documentation

## 2020-03-02 DIAGNOSIS — Z79899 Other long term (current) drug therapy: Secondary | ICD-10-CM | POA: Insufficient documentation

## 2020-03-02 DIAGNOSIS — I255 Ischemic cardiomyopathy: Secondary | ICD-10-CM | POA: Diagnosis not present

## 2020-03-02 DIAGNOSIS — I252 Old myocardial infarction: Secondary | ICD-10-CM | POA: Insufficient documentation

## 2020-03-02 DIAGNOSIS — Z951 Presence of aortocoronary bypass graft: Secondary | ICD-10-CM | POA: Diagnosis not present

## 2020-03-02 DIAGNOSIS — F419 Anxiety disorder, unspecified: Secondary | ICD-10-CM | POA: Insufficient documentation

## 2020-03-02 DIAGNOSIS — Z95810 Presence of automatic (implantable) cardiac defibrillator: Secondary | ICD-10-CM | POA: Insufficient documentation

## 2020-03-02 DIAGNOSIS — I447 Left bundle-branch block, unspecified: Secondary | ICD-10-CM | POA: Insufficient documentation

## 2020-03-02 DIAGNOSIS — Z8249 Family history of ischemic heart disease and other diseases of the circulatory system: Secondary | ICD-10-CM | POA: Insufficient documentation

## 2020-03-02 DIAGNOSIS — I340 Nonrheumatic mitral (valve) insufficiency: Secondary | ICD-10-CM

## 2020-03-02 DIAGNOSIS — K589 Irritable bowel syndrome without diarrhea: Secondary | ICD-10-CM | POA: Insufficient documentation

## 2020-03-02 DIAGNOSIS — R140 Abdominal distension (gaseous): Secondary | ICD-10-CM | POA: Insufficient documentation

## 2020-03-02 DIAGNOSIS — I5022 Chronic systolic (congestive) heart failure: Secondary | ICD-10-CM

## 2020-03-02 LAB — COMPREHENSIVE METABOLIC PANEL
ALT: 21 U/L (ref 0–44)
AST: 27 U/L (ref 15–41)
Albumin: 3.9 g/dL (ref 3.5–5.0)
Alkaline Phosphatase: 120 U/L (ref 38–126)
Anion gap: 11 (ref 5–15)
BUN: 23 mg/dL (ref 8–23)
CO2: 25 mmol/L (ref 22–32)
Calcium: 9.7 mg/dL (ref 8.9–10.3)
Chloride: 101 mmol/L (ref 98–111)
Creatinine, Ser: 1.08 mg/dL (ref 0.61–1.24)
GFR, Estimated: >60 mL/min (ref >60)
Glucose, Bld: 145 mg/dL — ABNORMAL HIGH (ref 70–99)
Potassium: 4.6 mmol/L (ref 3.5–5.1)
Sodium: 137 mmol/L (ref 135–145)
Total Bilirubin: 1.9 mg/dL — ABNORMAL HIGH (ref 0.3–1.2)
Total Protein: 7 g/dL (ref 6.5–8.1)

## 2020-03-02 LAB — URIC ACID: Uric Acid, Serum: 3 mg/dL — ABNORMAL LOW (ref 3.7–8.6)

## 2020-03-02 LAB — CBC
HCT: 46.1 % (ref 39.0–52.0)
Hemoglobin: 14.8 g/dL (ref 13.0–17.0)
MCH: 28.8 pg (ref 26.0–34.0)
MCHC: 32.1 g/dL (ref 30.0–36.0)
MCV: 89.9 fL (ref 80.0–100.0)
Platelets: 216 10*3/uL (ref 150–400)
RBC: 5.13 MIL/uL (ref 4.22–5.81)
RDW: 16.3 % — ABNORMAL HIGH (ref 11.5–15.5)
WBC: 7.1 10*3/uL (ref 4.0–10.5)
nRBC: 0 % (ref 0.0–0.2)

## 2020-03-02 LAB — BRAIN NATRIURETIC PEPTIDE: B Natriuretic Peptide: 2010.3 pg/mL — ABNORMAL HIGH (ref 0.0–100.0)

## 2020-03-02 MED ORDER — TORSEMIDE 20 MG PO TABS
20.0000 mg | ORAL_TABLET | ORAL | 3 refills | Status: DC
Start: 2020-03-02 — End: 2020-10-10

## 2020-03-02 NOTE — Patient Instructions (Signed)
Increase Torsemide to 20 mg ( 1 tablet) Daily only on Monday,Wednesday Friday and Saturday  Take an extra dose of  torsemide tomorrow  Labs done today, your results will be available in MyChart, we will contact you for abnormal readings.  Your physician recommends that you schedule a follow-up appointment in: 3-4 months  If you have any questions or concerns before your next appointment please send [name] a message through [place] or call our office at [phone].    TO LEAVE A MESSAGE FOR THE NURSE SELECT OPTION 2, PLEASE LEAVE A MESSAGE INCLUDING: . YOUR NAME . DATE OF BIRTH . CALL BACK NUMBER . REASON FOR CALL**this is important as we prioritize the call backs  [place] AS LONG AS YOU CALL BEFORE 4:00 PM  At the [facility], you and your health needs are our priority. As part of our continuing mission to provide you with exceptional heart care, we have created designated Provider Care Teams. These Care Teams include your primary Cardiologist (physician) and Advanced Practice Providers (APPs- Physician Assistants and Nurse Practitioners) who all work together to provide you with the care you need, when you need it.   You may see any of the following providers on your designated Care Team at your next follow up: Marland Kitchen Dr Glori Bickers . Dr Loralie Champagne . Darrick Grinder, NP . Lyda Jester, [place] . Audry Riles, PharmD   Please be sure to bring in all your medications bottles to every appointment.

## 2020-03-02 NOTE — Progress Notes (Signed)
Patient ID: [name], male   DOB: [birth year], 79 y.o.   MRN: [id number]    Advanced Heart Failure Clinic Note   Date:  03/02/2020   ID:  [name], [name] 13-Mar-1940, MRN [id number]  PCP:  Jason Frees, MD  Primary Cardiologist:  [name] Referring: [name]   History of Present Illness: [name] is a 80 y.o. 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 In September 1998 he underwent CABG  after an unsuccessful attempt at stenting of his proximal LAD by [name]. Ejection fraction was 25-30%. He is s/p ICD with upgrade to CRT-D   Myoview 11/13  in November 2013 showed a large area of scar in the LAD territory (extent 44%) involving the mid to apical anterior,apical and infero-apical to mid infero-septal and apical lateral wall without associated ischemia.  Admitted 5/16 atrial fibrillation and started on Tikosyn. He was enrolled in the genetic AF trial (bucindolol vs Toprol). He did poorly in the trial in the setting of titration of beta-blocker. He was referred to HF Clinic  Echo 4/21 EF 20% LV markedly dilated. Severe MR  RV moderately reduced   I have reviewed echo with Dr. Copper regarding MitraClip and felt to be too advanced  He presents today for follow up with his son. Doing fairly well. But remains very anxious about his condition. Continues to struggle with IBS and always bloated and unsure if it is his heart of his stonach. Remains active gets 5-6K steps per day. Mostly walks around the house. Occasional bloating. No CP. Mild exertional dyspnea    CPX 5/21  FVC 3.17 (89%)    FEV1 2.35 (88%)     FEV1/FVC 74 (98%)     MVV 71 (66%)   Resting HR: 69 Standing HR: 72 Peak HR: 105  (74% age predicted max HR)  BP rest: 104/58 Standing BP: 90/56 BP peak: 112/60  Peak VO2: 15.5 (66% predicted peak VO2)  VE/VCO2 slope: 43  OUES: 1.12  Peak RER: 1.11    Ventilatory Threshold: 13.6 (57% predicted or measured peak VO2)  VE/MVV: 71%  O2pulse: 9  (90% predicted O2pulse)   Echo 04/04/17 EF 20% with mild to mod reduction RV function.   CPX 02/17/16 Resting HR: 73 Peak HR: 108  (75% age predicted max HR) BP rest: 110/58 BP peak: 150/56 Peak VO2: 14.4 (58% predicted peak VO2) VE/VCO2 slope: 33 OUES: 1.32 Peak RER: 1.10 PETCO2 at peak: 33 O2pulse: 9  (82% predicted O2pulse)  Echo 12/17 EF 15-20%, Moderate MR, RV with reduced function Echo 5/17 EF 20% RV mildly down.  Echo 3/19: EF 20%, RV mild to moderate reduced function    Labs 10/04/15 K 5.3, Creatinine 0.85, BUN 15  Past Medical History:  Diagnosis Date  . Acute on chronic systolic CHF (congestive heart failure) ([place]) 06/09/2015  . 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 Cardiomyopathy, ischemic 2011   with EF 25-35% by echo  . Coronary artery disease    Hx MI 1992, CABG 1998 , Nuc study 11.2013 large scar but no ischemia  . GERD (gastroesophageal reflux disease)   . 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 NSVT (nonsustained ventricular tachycardia) ([place])   . Paroxysmal atrial fibrillation ([facility])     Current Outpatient Medications  Medication Sig Dispense Refill  .  acetaminophen (TYLENOL) 650 MG CR tablet Take 650 mg by mouth 2 (two) times daily.     Marland Kitchen allopurinol (ZYLOPRIM) 300 MG tablet Take 300 mg by mouth daily.    . bisoprolol (ZEBETA) 5 MG tablet Take 0.5 tablets (2.5 mg total) by mouth daily. 45 tablet 3  . cetirizine (ZYRTEC) 10 MG tablet Take 10 mg by mouth daily.    . Cholecalciferol (VITAMIN D) 2000 units CAPS Take 2,000-4,000 capsules by mouth See admin instructions. 4000 units MWF, 2000 units all other days    . dapagliflozin propanediol (FARXIGA) 10 MG TABS tablet Take 1 tablet (10 mg total) by mouth daily before breakfast. 90 tablet 3  . dicyclomine (BENTYL) 10 MG  capsule TAKE 1 CAPSULE EVERY 8 HOURS AS NEEDED FOR SPASMS 60 capsule 2  . isosorbide mononitrate (IMDUR) 30 MG 24 hr tablet Take 0.5 tablets (15 mg total) by mouth daily. 45 tablet 0  . LORazepam (ATIVAN) 1 MG tablet Take 0.25-0.5 mg by mouth at bedtime.    Marland Kitchen losartan (COZAAR) 25 MG tablet TAKE ONE TABLET BY MOUTH TWICE A DAY 180 tablet 3  . magnesium oxide (MAG-OX) 400 MG tablet Take 400 mg by mouth daily at 12 noon.     . methylcellulose (CITRUCEL) oral powder Take as directed    . mometasone (ASMANEX) 220 MCG/INH inhaler Inhale 1 puff into the lungs daily.    . montelukast (SINGULAIR) 10 MG tablet Take 1 tablet (10 mg total) by mouth at bedtime. PLEASE SCHEDULE APPOINTMENT. 30 tablet 0  . pantoprazole (PROTONIX) 20 MG tablet Take 1 tablet (20 mg total) by mouth 2 (two) times daily. 180 tablet 1  . Polyethyl Glycol-Propyl Glycol (SYSTANE OP) Apply 2-3 drops to eye 3 (three) times daily as needed (dry eyes).    . potassium chloride (KLOR-CON) 10 MEQ tablet Take 0.5 tablets (5 mEq total) by mouth Monday, Wednesday & Friday. can take extra as needed 45 tablet 0  . simvastatin (ZOCOR) 40 MG tablet TAKE 1 TABLET EVERY DAY 90 tablet 2  . spironolactone (ALDACTONE) 25 MG tablet TAKE 1 TABLET EVERY DAY 90 tablet 3  . tamsulosin (FLOMAX) 0.4 MG CAPS capsule Take 0.4 mg by mouth daily.    Marland Kitchen torsemide (DEMADEX) 20 MG tablet Take 1 tablet (20 mg total) by mouth 3 (three) times a week. 45 tablet 3  . warfarin (COUMADIN) 5 MG tablet TAKE 1/2 TO 1 TABLET DAILY AS DIRECTED (5MG  ON SUNDAY, TUESDAY, THURSDAY AND TAKE 2.5MG  ON ALL OTHER DAYS) 60 tablet 0   No current facility-administered medications for this encounter.    Allergies:    Allergies  Allergen Reactions  . Amoxicillin Rash    Has patient had a PCN reaction causing immediate rash, facial/tongue/throat swelling, SOB or lightheadedness with hypotension: Yes Has patient had a PCN reaction causing severe rash involving mucus membranes or skin  necrosis: No Has patient had a PCN reaction that required hospitalization: No Has patient had a PCN reaction occurring within the last 10 years: No If all of the above answers are "NO", then may proceed with Cephalosporin use.   Marland Kitchen Penicillins Rash    Has patient had a PCN reaction causing immediate rash, facial/tongue/throat swelling, SOB or lightheadedness with hypotension: Yes Has patient had a PCN reaction causing severe rash involving mucus membranes or skin necrosis: No Has patient had a PCN reaction that required hospitalization: No Has patient had a PCN reaction occurring within the last 10 years: No If all of the  above answers are "NO", then may proceed with Cephalosporin use.     Social History:  The patient  reports that he has never smoked. He has never used smokeless tobacco. He reports current alcohol use of about 2.0 standard drinks of alcohol per week. He reports that he does not use drugs.   Family history:   Family History  Problem Relation Age of Onset  . Heart disease Father        questionable  . Stroke Mother   . Heart failure Sister   . Healthy Brother   . Healthy Sister   . Parkinsonism Brother   . Colon cancer Neg Hx    Review of systems complete and found to be negative unless listed in HPI.    PHYSICAL EXAM: Vitals:   03/02/20 0915  BP: 110/68  Pulse: 69  SpO2: 96%  Weight: 61.1 kg (134 lb 9.6 oz)   Wt Readings from Last 3 Encounters:  03/02/20 61.1 kg (134 lb 9.6 oz)  11/04/19 59.9 kg (132 lb)  10/16/19 60.1 kg (132 lb 6.4 oz)   General:  Well appearing. No resp difficulty HEENT: normal Neck: supple. no JVD. Carotids 2+ bilat; no bruits. No lymphadenopathy or thryomegaly appreciated. Cor: PMI nondisplaced. Regular rate & rhythm. 3/6 MR Lungs: clear Abdomen: soft, nontender, nondistended. No hepatosplenomegaly. No bruits or masses. Good bowel sounds. Extremities: no cyanosis, clubbing, rash, edema Neuro: alert & orientedx3, cranial nerves  grossly intact. moves all 4 extremities w/o difficulty. Affect pleasant  ICD interrogated: No VT/AF Activity level 1.5 hours per day. Fluid mildly elevated Personally reviewed   ASSESSMENT AND PLAN:  1. 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 PVO2 improved since 2018 but VEVCO2 elevated suggestive of high pulmonary pressures with exercise - Echo 05/18/19 EF 20% with moderate RV dysfunction. LV markedly dilated LVIDd 7.0 cm Severe MR, mod AI, mod TR.  - Remains NYHA III symptoms in face of severe biV dysfunction. Symptoms compounded by IBS and anxiety - Still getting 5-6k per day - Rash after taking Entresto.  - Continue losartan 25 bid - Continue Farxiga 10 - Continue spiro 25 mg daily - Continue bisoprolol 2.5 mg daily. Unable to tolerate higher doses.  - Volume status got low after addition of [place].Torsemide changed to 20mg  MWF. Fluid level now back up. Increase torsemide to 20mg  MWFS - I again spent a long time reassuring him that he has far exceeded my initial expectations based on the severity of his heart condition and currently I do not see any reason this can't continue.  . We did discuss the fact that he is not candidate for advanced therapies including transplant (age), VAD (size and RV dysfunction). MitraClip (LV failure too advanced)  - Will screen for Barostim or Anthem - ICD interrogated personally in clinic   2. PAF - Maintaining NSR on exam and ICD - Continue coumadin. Does not want Eliquis - No bleeding   3. CAD s/p previous anterior infarct - No s/s ischemia - Off ASA due to warfarin. Continue statin. - LDL followed by PCP  4. LBBB - Now s/p CRT. 100% biv pacing on device today - No change  5. Severe MR - not candidate for Clip  Glori Bickers, MD  9:48 AM

## 2020-03-02 NOTE — Addendum Note (Signed)
Encounter addended by: Jolaine Artist, MD on: 03/02/2020 10:32 AM  Actions taken: Level of Service modified, Visit diagnoses modified, Charge Capture section accepted

## 2020-03-07 ENCOUNTER — Ambulatory Visit (INDEPENDENT_AMBULATORY_CARE_PROVIDER_SITE_OTHER): Payer: Medicare HMO

## 2020-03-07 DIAGNOSIS — Z95810 Presence of automatic (implantable) cardiac defibrillator: Secondary | ICD-10-CM

## 2020-03-07 DIAGNOSIS — I5022 Chronic systolic (congestive) heart failure: Secondary | ICD-10-CM | POA: Diagnosis not present

## 2020-03-11 NOTE — Progress Notes (Signed)
EPIC Encounter for ICM Monitoring  Patient Name: [name] is a 80 y.o. male Date: 03/11/2020 Primary Care Physican: [name], MD Primary Cardiologist:Kelly/Bensimhon Electrophysiologist: [name] BiV Pacing:97.6% 12/20/2021Weight: 132-134lbs  Time in AT/AF 0.0 hr/day (0.0%) (on Coumadin)   Spoke with patient and reports feeling well at this time. Denies fluid symptoms.   Optivol thoracic impedancesuggesting normal fluid levels for past week.  Prescribed:  Torsemide 20 mg 1 tablettree times a week Mon, Wed &Friday. Can take extra as needed  Potassium 10 mEq take 0.5 tabletthree times a week Mon, Wed &Friday. Can take extra as needed  Spironolactone 25 mg take 1 table daily  Labs: 10/16/2019 Creatinine 0.89, BUN 12, Potassium 4.0, Sodium 137, GFR>60 07/13/2019 Creatinine 0.82, BUN 14, Potassium 3.8, Sodium 135, GFR >60 05/18/2019 Creatinine 0.88, BUN 15, Potassium 3.9, Sodium 135, GFR >60 05/11/2019 Creatinine 0.92, BUN 22, Potassium 3.4, Sodium 137, GFR 79-91 01/13/2021Creatinine0.92, BUN 16, Potassium4.6, Sodium 134, GFR >60 A complete set of results can be found in Results Review  Recommendations:No changes and encouraged to call if experiencing any fluid symptoms.  Follow-up plan: ICM clinic phone appointment on3/14/2022. 91 day device clinic remote transmission3/04/2020.   EP/Cardiology Office Visits: 05/02/2020 with Dr [name].   5/4/2022with Dr.Benismhon.   Copy of ICM check sent to [place].   3 month ICM trend: 03/07/2020.    1 Year ICM trend:       [name], RN 03/11/2020 1:20 PM

## 2020-03-16 ENCOUNTER — Other Ambulatory Visit: Payer: Self-pay

## 2020-03-16 ENCOUNTER — Ambulatory Visit (INDEPENDENT_AMBULATORY_CARE_PROVIDER_SITE_OTHER): Payer: Medicare HMO

## 2020-03-16 DIAGNOSIS — Z5181 Encounter for therapeutic drug level monitoring: Secondary | ICD-10-CM

## 2020-03-16 DIAGNOSIS — I480 Paroxysmal atrial fibrillation: Secondary | ICD-10-CM | POA: Diagnosis not present

## 2020-03-16 DIAGNOSIS — Z7901 Long term (current) use of anticoagulants: Secondary | ICD-10-CM | POA: Diagnosis not present

## 2020-03-16 DIAGNOSIS — I4891 Unspecified atrial fibrillation: Secondary | ICD-10-CM | POA: Diagnosis not present

## 2020-03-16 LAB — POCT INR: INR: 3.1 — AB (ref 2.0–3.0)

## 2020-03-16 NOTE — Patient Instructions (Signed)
Continue taking 1 tablet daily except 1/2 tablet each Monday, Wednesday and Friday.  Repeat INR in 6 weeks  

## 2020-03-25 ENCOUNTER — Other Ambulatory Visit (HOSPITAL_COMMUNITY): Payer: Self-pay | Admitting: Cardiovascular Disease

## 2020-03-30 ENCOUNTER — Other Ambulatory Visit: Payer: Self-pay

## 2020-03-30 MED ORDER — PANTOPRAZOLE SODIUM 20 MG PO TBEC: 20.0000 mg | DELAYED_RELEASE_TABLET | Freq: Two times a day (BID) | ORAL | 1 refills | Status: DC

## 2020-03-30 NOTE — Progress Notes (Signed)
Refill pantoprazole 20 mg BID

## 2020-04-11 ENCOUNTER — Ambulatory Visit: Payer: Medicare HMO

## 2020-04-11 DIAGNOSIS — Z95810 Presence of automatic (implantable) cardiac defibrillator: Secondary | ICD-10-CM

## 2020-04-11 DIAGNOSIS — I5022 Chronic systolic (congestive) heart failure: Secondary | ICD-10-CM

## 2020-04-13 NOTE — Progress Notes (Signed)
EPIC Encounter for ICM Monitoring  Patient Name: [name] is a 80 y.o. male Date: 04/13/2020 Primary Care Physican: [name], MD Primary Cardiologist:Kelly/Bensimhon Electrophysiologist: [name] BiV Pacing:97.2% 3/16/2022Weight: 132-134lbs  Time in AT/AF <0.1 hr/day (<0.1%) (on Coumadin)   Spoke with patient and reports feeling well at this time.  Denies fluid symptoms.    Optivol thoracic impedancesuggestingnormal fluid levels.  Prescribed:  Torsemide 20 mg 1 tablettree times a week Mon, Wed &Friday. Can take extra as needed  Potassium 10 mEq take 0.5 tabletthree times a week Mon, Wed &Friday. Can take extra as needed  Spironolactone 25 mg take 1 table daily  Labs: 10/16/2019 Creatinine 0.89, BUN 12, Potassium 4.0, Sodium 137, GFR>60 07/13/2019 Creatinine 0.82, BUN 14, Potassium 3.8, Sodium 135, GFR >60 05/18/2019 Creatinine 0.88, BUN 15, Potassium 3.9, Sodium 135, GFR >60 05/11/2019 Creatinine 0.92, BUN 22, Potassium 3.4, Sodium 137, GFR 79-91 01/13/2021Creatinine0.92, BUN 16, Potassium4.6, Sodium 134, GFR >60 A complete set of results can be found in Results Review  Recommendations:No changes and encouraged to call if experiencing any fluid symptoms.  Follow-up plan: ICM clinic phone appointment on4/18/2022. 91 day device clinic remote transmission3/24/2022.   EP/Cardiology Office Visits: 05/02/2020 with Dr [name].   5/4/2022with Dr.Benismhon.   Copy of ICM check sent to [facility].    3 month ICM trend: 04/11/2020.    1 Year ICM trend:       [name], RN 04/13/2020 10:39 AM

## 2020-04-14 ENCOUNTER — Ambulatory Visit: Payer: Medicare HMO | Admitting: Nurse Practitioner

## 2020-04-14 ENCOUNTER — Telehealth: Payer: Self-pay | Admitting: Nurse Practitioner

## 2020-04-14 ENCOUNTER — Encounter: Payer: Self-pay | Admitting: Nurse Practitioner

## 2020-04-14 VITALS — BP 98/48 | HR 72 | Ht 67.0 in | Wt 132.4 lb

## 2020-04-14 DIAGNOSIS — R1011 Right upper quadrant pain: Secondary | ICD-10-CM | POA: Diagnosis not present

## 2020-04-14 MED ORDER — PANTOPRAZOLE SODIUM 20 MG PO TBEC
20.0000 mg | DELAYED_RELEASE_TABLET | Freq: Two times a day (BID) | ORAL | 1 refills | Status: DC
Start: 2020-04-14 — End: 2020-08-12

## 2020-04-14 NOTE — Telephone Encounter (Signed)
Inbound call from patient stating it will be more affordable to have his ultrasound at [facility] and is requesting orders to be sent there.  Please advise.

## 2020-04-14 NOTE — Progress Notes (Signed)
ASSESSMENT AND PLAN    # 80 yo male with chronic IBS symptoms for several years.  Work-up for other etiologies of his symptoms has been unremarkable.  He has been tried on a variety of regimens such as Bentyl, peppermint, rifaximin, low FODMAP diet.   # Non-radiating RUQ pain which patient says has been present for many years.  Pain possibly part of his constellation of symptoms for which she has been previously evaluated.  Sometimes eating makes the pain worse, no other defining characteristics.  An ultrasound back in 2017 did show gallbladder wall thickening -- Doubt pain biliary nature but will repeat an RUQ ultrasound --He thinks FDguard may have worked in the past but he only took it for a week, it was expensive.  I will provide him with some samples --Recommend trial of Salonpas patches for possible musculoskeletal pain  --Patient is taking dicyclomine 3 times daily.  Of note he has an enlarged prostate but no complaints of urinary retention  HISTORY OF PRESENT ILLNESS     Primary Gastroenterologist : Bloomingdale Cellar, MD  Chief Complaint : Chronic RUQ pain  [name] is a 80 y.o. male with multiple medical problems not listed to CAD /remote CABG, ischemic cardiomyopathy, CHF, AICD placement, atrial fibrillation on Coumadin, IBS.  Oct 2021 -last office visit for bloating, epigastric pain.  Citrucel caused constipation.  No improvement in the past with Xifaxan or IBgard.  Metamucil as tolerated was resumed.  Omeprazole was discontinued and he was started on pantoprazole 20 mg twice daily.  Given FDguard    Interval History:   Patient is here with RUQ pain that he says he has had for years. The pain sometimes gets worse with eating.  Pain not exacerbated by physical activity. Sometimes having a BM help. His bowel movements are normal with Metamucil.  When he gets the pain it may last several days then may not recur for several days. He is taking Bentyl everyday three times  a day .Taking Pantoprazole 20 mg BID. He thinks the FDguard helped but only took for a week. The pain is an achy sensation. CT scan in Feb 2020 was unrevealing but RUQ [name] in 2017 showed diffuse gallbladder wall thickening.    Data Reviewed: 03/02/20 Normal renal function, normal liver chemistries BNP 2000 Normal CBC   Previous Endoscopic Evaluations / Pertinent Studies:     May 2017 HIDA scan -Normal   May 2017 RUQ ultrasound --Well distended gallbladder with diffuse gallbladder wall thickening.  No cholelithiasis.Marland Kitchen  --Possible small liver hemangioma  August 2019 upper GI series --Mild GERD.  No hiatal hernia -small duodenal diverticulum -mild esophageal dysmotility  February 2020 CT scan  abd/ pelvis with contrast --Mildly enlarged prostate, some concern for prostate cancer. --Large bladder diverticulum --Diverticulosis --Small left inguinal hernia --Cardiomegaly   Colonoscopy 11/04/18  --Ileal diverticulum. - Two colonic angiodysplastic lesions. - One 4 mm polyp at the hepatic flexure - Diverticulosis in the entire examined colon. - Internal hemorrhoids. - The examination was otherwise normal. No overt inflammation -Random colon biopsies taken biopsies were taken   Path:  Random colon biopsies normal.  Colon polyp compatible with tubular adenoma  Past Medical History:  Diagnosis Date  . Acute on chronic systolic CHF (congestive heart failure) ([place]) 06/09/2015  . 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 Cardiomyopathy, ischemic 2011   with EF 25-35% by echo  . Coronary artery disease    Hx  MI 1992, CABG 1998 , Nuc study 11.2013 large scar but no ischemia  . GERD (gastroesophageal reflux disease)   . 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 NSVT (nonsustained ventricular tachycardia) ([place])   . Paroxysmal atrial fibrillation (HCC)     Current Medications, Allergies, Past  Surgical History, Family History and Social History were reviewed in [employer] record.   Current Outpatient Medications  Medication Sig Dispense Refill  . acetaminophen (TYLENOL) 650 MG CR tablet Take 650 mg by mouth 2 (two) times daily.     Marland Kitchen allopurinol (ZYLOPRIM) 300 MG tablet Take 300 mg by mouth daily.    . bisoprolol (ZEBETA) 5 MG tablet Take 0.5 tablets (2.5 mg total) by mouth daily. 45 tablet 3  . cetirizine (ZYRTEC) 10 MG tablet Take 10 mg by mouth daily.    . Cholecalciferol (VITAMIN D) 2000 units CAPS Take 2,000-4,000 capsules by mouth See admin instructions. 4000 units MWF, 2000 units all other days    . dapagliflozin propanediol (FARXIGA) 10 MG TABS tablet Take 1 tablet (10 mg total) by mouth daily before breakfast. 90 tablet 3  . dicyclomine (BENTYL) 10 MG capsule TAKE 1 CAPSULE EVERY 8 HOURS AS NEEDED FOR SPASMS 60 capsule 2  . isosorbide mononitrate (IMDUR) 30 MG 24 hr tablet Take 0.5 tablets (15 mg total) by mouth daily. 45 tablet 0  . LORazepam (ATIVAN) 1 MG tablet Take 0.25-0.5 mg by mouth at bedtime.    Marland Kitchen losartan (COZAAR) 25 MG tablet TAKE ONE TABLET BY MOUTH TWICE A DAY 180 tablet 3  . magnesium oxide (MAG-OX) 400 MG tablet Take 400 mg by mouth daily at 12 noon.     . methylcellulose (CITRUCEL) oral powder Take as directed    . mometasone (ASMANEX) 220 MCG/INH inhaler Inhale 1 puff into the lungs daily.    . montelukast (SINGULAIR) 10 MG tablet Take 1 tablet (10 mg total) by mouth at bedtime. PLEASE SCHEDULE APPOINTMENT. 30 tablet 0  . pantoprazole (PROTONIX) 20 MG tablet Take 1 tablet (20 mg total) by mouth 2 (two) times daily. 180 tablet 1  . Polyethyl Glycol-Propyl Glycol (SYSTANE OP) Apply 2-3 drops to eye 3 (three) times daily as needed (dry eyes).    . potassium chloride (KLOR-CON) 10 MEQ tablet Take 0.5 tablets (5 mEq total) by mouth Monday, Wednesday & Friday. can take extra as needed 45 tablet 0  . simvastatin (ZOCOR) 40 MG tablet TAKE 1  TABLET EVERY DAY 90 tablet 2  . spironolactone (ALDACTONE) 25 MG tablet TAKE 1 TABLET EVERY DAY 90 tablet 3  . tamsulosin (FLOMAX) 0.4 MG CAPS capsule Take 0.4 mg by mouth daily.    Marland Kitchen torsemide (DEMADEX) 20 MG tablet Take 1 tablet (20 mg total) by mouth as directed. Take medication Monday Wednesday and Friday and Saturday 45 tablet 3  . warfarin (COUMADIN) 5 MG tablet Take 1/2 a tablet to 1 tablet by mouth daily as directed by the coumadin clinic 90 tablet 0   No current facility-administered medications for this visit.    Review of Systems: No chest pain. No shortness of breath. No urinary complaints.   PHYSICAL EXAM :    Wt Readings from Last 3 Encounters:  04/14/20 132 lb 6.4 oz (60.1 kg)  03/02/20 134 lb 9.6 oz (61.1 kg)  11/04/19 132 lb (59.9 kg)    BP (!) 98/48   Pulse 72   Ht 5\' 7"  (1.702 m)  Wt 132 lb 6.4 oz (60.1 kg)   BMI 20.74 kg/m  Constitutional:  Pleasant male in no acute distress. Psychiatric: Normal mood and affect. Behavior is normal. EENT: Pupils normal.  Conjunctivae are normal. No scleral icterus. Neck supple.  Cardiovascular: Normal rate, regular rhythm. No edema Pulmonary/chest: Effort normal and breath sounds normal. No wheezing, rales or rhonchi. Abdominal: Soft, nondistended, nontender. Bowel sounds active throughout. There are no masses palpable. No hepatomegaly. Neurological: Alert and oriented to person place and time. Skin: Skin is warm and dry. No rashes noted.  [name], NP  04/14/2020, 11:07 AM

## 2020-04-14 NOTE — Patient Instructions (Signed)
If you are age 81 or older, your body mass index should be between 23-30. Your Body mass index is 20.74 kg/m. If this is out of the aforementioned range listed, please consider follow up with your Primary Care Provider.  If you are age 46 or younger, your body mass index should be between 19-25. Your Body mass index is 20.74 kg/m. If this is out of the aformentioned range listed, please consider follow up with your Primary Care Provider.   IMAGING:  . You will be contacted by [facility] (Your caller ID will indicate phone # [phone]) within the next business 2 days to schedule your RUQ Abdominal ultrasound. If you have not heard from them within 2 business days, please call [facility] at [phone] to follow up on the status of your appointment.    Try FDguard take as per box instructions.  Trial of Salonpas patches to RUQ.

## 2020-04-15 NOTE — Progress Notes (Signed)
Agree with assessment and plan as outlined.  

## 2020-04-18 NOTE — Telephone Encounter (Signed)
Patient informed of ultrasound time and date.

## 2020-04-18 NOTE — Telephone Encounter (Addendum)
Tuesday May 03, 2020 @ 10:15 am, arrive at 10 am @ [facility] imaging.

## 2020-04-18 NOTE — Addendum Note (Signed)
Addended by: Horris Latino on: 04/18/2020 09:00 AM   Modules accepted: Orders

## 2020-04-20 DIAGNOSIS — H9313 Tinnitus, bilateral: Secondary | ICD-10-CM | POA: Diagnosis not present

## 2020-04-20 DIAGNOSIS — J3089 Other allergic rhinitis: Secondary | ICD-10-CM | POA: Diagnosis not present

## 2020-04-20 DIAGNOSIS — J301 Allergic rhinitis due to pollen: Secondary | ICD-10-CM | POA: Diagnosis not present

## 2020-04-20 DIAGNOSIS — J3081 Allergic rhinitis due to animal (cat) (dog) hair and dander: Secondary | ICD-10-CM | POA: Diagnosis not present

## 2020-04-21 ENCOUNTER — Ambulatory Visit (INDEPENDENT_AMBULATORY_CARE_PROVIDER_SITE_OTHER): Payer: Medicare HMO

## 2020-04-21 DIAGNOSIS — I447 Left bundle-branch block, unspecified: Secondary | ICD-10-CM

## 2020-04-21 LAB — CUP PACEART REMOTE DEVICE CHECK
Battery Remaining Longevity: 36 mo
Battery Voltage: 2.95 V
Brady Statistic AP VP Percent: 94.86 %
Brady Statistic AP VS Percent: 1.68 %
Brady Statistic AS VP Percent: 3.34 %
Brady Statistic AS VS Percent: 0.12 %
Brady Statistic RA Percent Paced: 95.67 %
Brady Statistic RV Percent Paced: 0.31 %
Date Time Interrogation Session: 20220324043723
HighPow Impedance: 38 Ohm
HighPow Impedance: 66 Ohm
Implantable Lead Implant Date: 20010806
Implantable Lead Implant Date: 20180813
Implantable Lead Implant Date: 20180813
Implantable Lead Location: 753858
Implantable Lead Location: 753859
Implantable Lead Location: 753860
Implantable Lead Model: 147
Implantable Lead Model: 5092
Implantable Lead Serial Number: 104581
Implantable Pulse Generator Implant Date: 20180813
Lead Channel Impedance Value: 155.455
Lead Channel Impedance Value: 159.265
Lead Channel Impedance Value: 166.25 Ohm
Lead Channel Impedance Value: 184.154
Lead Channel Impedance Value: 189.525
Lead Channel Impedance Value: 285 Ohm
Lead Channel Impedance Value: 342 Ohm
Lead Channel Impedance Value: 361 Ohm
Lead Channel Impedance Value: 399 Ohm
Lead Channel Impedance Value: 456 Ohm
Lead Channel Impedance Value: 551 Ohm
Lead Channel Impedance Value: 551 Ohm
Lead Channel Impedance Value: 589 Ohm
Lead Channel Impedance Value: 589 Ohm
Lead Channel Impedance Value: 608 Ohm
Lead Channel Impedance Value: 646 Ohm
Lead Channel Impedance Value: 665 Ohm
Lead Channel Impedance Value: 665 Ohm
Lead Channel Pacing Threshold Amplitude: 0.625 V
Lead Channel Pacing Threshold Amplitude: 1.125 V
Lead Channel Pacing Threshold Amplitude: 1.625 V
Lead Channel Pacing Threshold Pulse Width: 0.4 ms
Lead Channel Pacing Threshold Pulse Width: 0.4 ms
Lead Channel Pacing Threshold Pulse Width: 0.4 ms
Lead Channel Sensing Intrinsic Amplitude: 2 mV
Lead Channel Sensing Intrinsic Amplitude: 2 mV
Lead Channel Sensing Intrinsic Amplitude: 7.375 mV
Lead Channel Sensing Intrinsic Amplitude: 7.375 mV
Lead Channel Setting Pacing Amplitude: 1.75 V
Lead Channel Setting Pacing Amplitude: 2 V
Lead Channel Setting Pacing Amplitude: 3.25 V
Lead Channel Setting Pacing Pulse Width: 0.4 ms
Lead Channel Setting Pacing Pulse Width: 0.4 ms
Lead Channel Setting Sensing Sensitivity: 0.3 mV

## 2020-04-26 ENCOUNTER — Other Ambulatory Visit (HOSPITAL_COMMUNITY): Payer: Self-pay

## 2020-04-26 MED ORDER — DICYCLOMINE HCL 10 MG PO CAPS: ORAL_CAPSULE | ORAL | 2 refills | Status: DC

## 2020-04-27 ENCOUNTER — Other Ambulatory Visit: Payer: Self-pay

## 2020-04-27 ENCOUNTER — Ambulatory Visit (INDEPENDENT_AMBULATORY_CARE_PROVIDER_SITE_OTHER): Payer: Medicare HMO

## 2020-04-27 DIAGNOSIS — I4891 Unspecified atrial fibrillation: Secondary | ICD-10-CM

## 2020-04-27 DIAGNOSIS — I480 Paroxysmal atrial fibrillation: Secondary | ICD-10-CM | POA: Diagnosis not present

## 2020-04-27 DIAGNOSIS — Z5181 Encounter for therapeutic drug level monitoring: Secondary | ICD-10-CM

## 2020-04-27 DIAGNOSIS — Z7901 Long term (current) use of anticoagulants: Secondary | ICD-10-CM | POA: Diagnosis not present

## 2020-04-27 LAB — POCT INR: INR: 2.9 (ref 2.0–3.0)

## 2020-04-27 NOTE — Patient Instructions (Signed)
Continue taking 1 tablet daily except 1/2 tablet each Monday, Wednesday and Friday.  Repeat INR in 6 weeks

## 2020-04-28 DIAGNOSIS — R059 Cough, unspecified: Secondary | ICD-10-CM | POA: Diagnosis not present

## 2020-04-30 ENCOUNTER — Telehealth: Payer: Self-pay | Admitting: Unknown Physician Specialty

## 2020-04-30 MED ORDER — NIRMATRELVIR/RITONAVIR (PAXLOVID)TABLET: 3.0000 | ORAL_TABLET | Freq: Two times a day (BID) | ORAL | 0 refills | Status: AC

## 2020-04-30 NOTE — Telephone Encounter (Signed)
Outpatient Oral COVID Treatment Note  I connected with [name] on 04/30/2020/12:52 PM by telephone and verified that I am speaking with the correct person using two identifiers.  I discussed the limitations, risks, security, and privacy concerns of performing an evaluation and management service by telephone and the availability of in person appointments. I also discussed with the patient that there may be a patient responsible charge related to this service. The patient expressed understanding and agreed to proceed.  Patient location: home Provider location: home  Diagnosis: COVID-19 infection  Purpose of visit: Discussion of potential use of Molnupiravir or Paxlovid, a new treatment for mild to moderate COVID-19 viral infection in non-hospitalized patients.   Subjective: Patient is a 80 y.o. male who has been diagnosed with COVID 19 viral infection.  Their symptoms began on 3/29 with cough    Past Medical History:  Diagnosis Date  . Acute on chronic systolic CHF (congestive heart failure) ([place]) 06/09/2015  . 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 Cardiomyopathy, ischemic 2011   with EF 25-35% by echo  . Coronary artery disease    Hx MI 1992, CABG 1998 , Nuc study 11.2013 large scar but no ischemia  . GERD (gastroesophageal reflux disease)   . 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 NSVT (nonsustained ventricular tachycardia) ([place])   . Paroxysmal atrial fibrillation (H[facility])     Allergies  Allergen Reactions  . Amoxicillin Rash    Has patient had a PCN reaction causing immediate rash, facial/tongue/throat swelling, SOB or lightheadedness with hypotension: Yes Has patient had a PCN reaction causing severe rash involving mucus membranes or skin necrosis: No Has patient had a PCN reaction that required hospitalization: No Has patient had a PCN reaction occurring within the last 10 years: No If  all of the above answers are "NO", then may proceed with Cephalosporin use.   Marland Kitchen Penicillins Rash    Has patient had a PCN reaction causing immediate rash, facial/tongue/throat swelling, SOB or lightheadedness with hypotension: Yes Has patient had a PCN reaction causing severe rash involving mucus membranes or skin necrosis: No Has patient had a PCN reaction that required hospitalization: No Has patient had a PCN reaction occurring within the last 10 years: No If all of the above answers are "NO", then may proceed with Cephalosporin use.      Current Outpatient Medications:  .  acetaminophen (TYLENOL) 650 MG CR tablet, Take 650 mg by mouth 2 (two) times daily. , Disp: , Rfl:  .  allopurinol (ZYLOPRIM) 300 MG tablet, Take 300 mg by mouth daily., Disp: , Rfl:  .  bisoprolol (ZEBETA) 5 MG tablet, Take 0.5 tablets (2.5 mg total) by mouth daily., Disp: 45 tablet, Rfl: 3 .  cetirizine (ZYRTEC) 10 MG tablet, Take 10 mg by mouth daily., Disp: , Rfl:  .  Cholecalciferol (VITAMIN D) 2000 units CAPS, Take 2,000-4,000 capsules by mouth See admin instructions. 4000 units MWF, 2000 units all other days, Disp: , Rfl:  .  dapagliflozin propanediol (FARXIGA) 10 MG TABS tablet, Take 1 tablet (10 mg total) by mouth daily before breakfast., Disp: 90 tablet, Rfl: 3 .  dicyclomine (BENTYL) 10 MG capsule, TAKE 1 CAPSULE EVERY 8 HOURS AS NEEDED FOR SPASMS, Disp: 60 capsule, Rfl: 2 .  isosorbide mononitrate (IMDUR) 30 MG 24 hr tablet, Take 0.5 tablets (15 mg total) by mouth daily., Disp: 45 tablet, Rfl: 0 .  LORazepam (ATIVAN) 1 MG tablet, Take 0.25-0.5 mg by mouth at bedtime., Disp: , Rfl:  .  losartan (COZAAR) 25 MG tablet, TAKE ONE TABLET BY MOUTH TWICE A DAY, Disp: 180 tablet, Rfl: 3 .  magnesium oxide (MAG-OX) 400 MG tablet, Take 400 mg by mouth daily at 12 noon. , Disp: , Rfl:  .  methylcellulose (CITRUCEL) oral powder, Take as directed, Disp: , Rfl:  .  mometasone (ASMANEX) 220 MCG/INH inhaler, Inhale 1 puff  into the lungs daily., Disp: , Rfl:  .  montelukast (SINGULAIR) 10 MG tablet, Take 1 tablet (10 mg total) by mouth at bedtime. PLEASE SCHEDULE APPOINTMENT., Disp: 30 tablet, Rfl: 0 .  pantoprazole (PROTONIX) 20 MG tablet, Take 1 tablet (20 mg total) by mouth 2 (two) times daily., Disp: 180 tablet, Rfl: 1 .  Polyethyl Glycol-Propyl Glycol (SYSTANE OP), Apply 2-3 drops to eye 3 (three) times daily as needed (dry eyes)., Disp: , Rfl:  .  potassium chloride (KLOR-CON) 10 MEQ tablet, Take 0.5 tablets (5 mEq total) by mouth Monday, Wednesday & Friday. can take extra as needed, Disp: 45 tablet, Rfl: 0 .  simvastatin (ZOCOR) 40 MG tablet, TAKE 1 TABLET EVERY DAY, Disp: 90 tablet, Rfl: 2 .  spironolactone (ALDACTONE) 25 MG tablet, TAKE 1 TABLET EVERY DAY, Disp: 90 tablet, Rfl: 3 .  tamsulosin (FLOMAX) 0.4 MG CAPS capsule, Take 0.4 mg by mouth daily., Disp: , Rfl:  .  torsemide (DEMADEX) 20 MG tablet, Take 1 tablet (20 mg total) by mouth as directed. Take medication Monday Wednesday and Friday and Saturday, Disp: 45 tablet, Rfl: 3 .  warfarin (COUMADIN) 5 MG tablet, Take 1/2 a tablet to 1 tablet by mouth daily as directed by the coumadin clinic, Disp: 90 tablet, Rfl: 0  Objective: Patient appears/sounds well.  They are in no apparent distress.  Breathing is non labored.  Mood and behavior are normal.  Laboratory Data:  Recent Results (from the past 2160 hour(s))  POCT INR     Status: None   Collection Time: 02/03/20 11:25 AM  Result Value Ref Range   INR 2.4 2.0 - 3.0  CBC     Status: Abnormal   Collection Time: 03/02/20 10:24 AM  Result Value Ref Range   WBC 7.1 4.0 - 10.5 K/uL   RBC 5.13 4.22 - 5.81 MIL/uL   Hemoglobin 14.8 13.0 - 17.0 g/dL   HCT 46.1 39.0 - 52.0 %   MCV 89.9 80.0 - 100.0 fL   MCH 28.8 26.0 - 34.0 pg   MCHC 32.1 30.0 - 36.0 g/dL   RDW 16.3 (H) 11.5 - 15.5 %   Platelets 216 150 - 400 K/uL   nRBC 0.0 0.0 - 0.2 %    Comment: Performed at [facility], [place] [address]  [place], [place] [address]  Comprehensive Metabolic Panel (CMET)     Status: Abnormal   Collection Time: 03/02/20 10:24 AM  Result Value Ref Range   Sodium 137 135 - 145 mmol/L   Potassium 4.6 3.5 - 5.1 mmol/L   Chloride 101 98 - 111 mmol/L   CO2 25 22 - 32 mmol/L   Glucose, Bld 145 (H) 70 - 99 mg/dL    Comment: Glucose reference range applies only to samples taken after fasting for at least 8 hours.   BUN 23 8 - 23 mg/dL   Creatinine, Ser 1.08 0.61 - 1.24 mg/dL   Calcium 9.7 8.9 - 10.3 mg/dL   Total Protein 7.0 6.5 -  8.1 g/dL   Albumin 3.9 3.5 - 5.0 g/dL   AST 27 15 - 41 U/L   ALT 21 0 - 44 U/L   Alkaline Phosphatase 120 38 - 126 U/L   Total Bilirubin 1.9 (H) 0.3 - 1.2 mg/dL   GFR, Estimated >60 >60 mL/min    Comment: (NOTE) Calculated using the CKD-EPI Creatinine Equation (2021)    Anion gap 11 5 - 15    Comment: Performed at [facility] [address] [place], [place] [address]  Uric acid     Status: Abnormal   Collection Time: 03/02/20 10:24 AM  Result Value Ref Range   Uric Acid, Serum 3.0 (L) 3.7 - 8.6 mg/dL    Comment: Performed at [facility] [address] [place], [place] [address]  B Nat Peptide     Status: Abnormal   Collection Time: 03/02/20 10:24 AM  Result Value Ref Range   B Natriuretic Peptide 2,010.3 (H) 0.0 - 100.0 pg/mL    Comment: Performed at [facility] [address], [place], [place] [address]  POCT INR     Status: Abnormal   Collection Time: 03/16/20 11:23 AM  Result Value Ref Range   INR 3.1 (A) 2.0 - 3.0  CUP PACEART REMOTE DEVICE CHECK     Status: None   Collection Time: 04/21/20  4:37 AM  Result Value Ref Range   Date Time Interrogation Session [id number]    Pulse Generator Manufacturer MERM    Pulse Gen Model [id number] Claria MRI Quad CRT-D    Pulse Gen Serial Number [id number]    Clinic Name [facility]    Implantable Pulse Generator Type Cardiac Resynch Therapy Defibulator    Implantable Pulse Generator Implant  Date 10258527    Implantable Lead Manufacturer MERM    Implantable Lead Model 5092 CapSure SP Novus    Implantable Lead Serial Number [id number]    Implantable Lead Implant Date 78242353    Implantable Lead Location Detail 1 UNKNOWN    Implantable Lead Location [id number]    Implantable Lead Manufacturer [facility]    Implantable Lead Model 1458Q Quartet    Implantable Lead Serial Number [id number]    Implantable Lead Implant Date 61443154    Implantable Lead Location Detail 1 UNKNOWN    Implantable Lead Location [id number]    Implantable Lead Manufacturer GU[employer]    Implantable Lead Model 0147 [employer]otak Reliance    Implantable Lead Serial Number [id number]    Implantable Lead Implant Date 00867619    Implantable Lead Location Detail 1 APEX    Implantable Lead Location [id number]    Lead Channel Setting Sensing Sensitivity 0.3 mV   Lead Channel Setting Pacing Amplitude 2 V   Lead Channel Setting Pacing Pulse Width 0.4 ms   Lead Channel Setting Pacing Amplitude 3.25 V   Lead Channel Setting Pacing Pulse Width 0.4 ms   Lead Channel Setting Pacing Amplitude 1.75 V   Lead Channel Setting Pacing Capture Mode Monitor Capture    Lead Channel Impedance Value 456 ohm   Lead Channel Sensing Intrinsic Amplitude 2 mV   Lead Channel Sensing Intrinsic Amplitude 2 mV   Lead Channel Pacing Threshold Amplitude 0.625 V   Lead Channel Pacing Threshold Pulse Width 0.4 ms   Lead Channel Impedance Value 665 ohm   Lead Channel Impedance Value 665 ohm   Lead Channel Sensing Intrinsic Amplitude 7.375 mV   Lead Channel Sensing Intrinsic Amplitude 7.375 mV   Lead Channel Pacing  Threshold Amplitude 1.625 V   Lead Channel Pacing Threshold Pulse Width 0.4 ms   HighPow Impedance 38 ohm   HighPow Impedance 66 ohm   Lead Channel Impedance Value 608 ohm   Lead Channel Impedance Value 646 ohm   Lead Channel Impedance Value 589 ohm   Lead Channel Impedance Value 589 ohm   Lead Channel Impedance Value 551 ohm   Lead Channel Impedance  Value 551 ohm   Lead Channel Impedance Value 399 ohm   Lead Channel Impedance Value 342 ohm   Lead Channel Impedance Value 361 ohm   Lead Channel Impedance Value 285 ohm   Lead Channel Impedance Value 184.154    Lead Channel Impedance Value 189.525    Lead Channel Impedance Value 166.25 ohm   Lead Channel Impedance Value 155.455    Lead Channel Impedance Value 159.265    Lead Channel Pacing Threshold Amplitude 1.125 V   Lead Channel Pacing Threshold Pulse Width 0.4 ms   Battery Status OK    Battery Remaining Longevity 36 mo   Battery Voltage 2.95 V   Brady Statistic RA Percent Paced 95.67 %   Brady Statistic RV Percent Paced 0.31 %   Brady Statistic AP VP Percent 94.86 %   Brady Statistic AS VP Percent 3.34 %   Brady Statistic AP VS Percent 1.68 %   Brady Statistic AS VS Percent 0.12 %  POCT INR     Status: None   Collection Time: 04/27/20 11:30 AM  Result Value Ref Range   INR 2.9 2.0 - 3.0     Assessment: 80 y.o. male with mild/moderate COVID 19 viral infection diagnosed on 3/31 at high risk for progression to severe COVID 19.  Plan:  This patient is a 80 y.o. male that meets the following criteria for Emergency Use Authorization of: Paxlovid 1. Age >12 yr AND > 40 kg 2. SARS-COV-2 positive test 3. Symptom onset < 5 days 4. Mild-to-moderate COVID disease with high risk for severe progression to hospitalization or death  I have spoken and communicated the following to the patient or parent/caregiver regarding: 1. Paxlovid is an unapproved drug that is authorized for use under an Emergency Use Authorization.  2. There are no adequate, approved, available products for the treatment of COVID-19 in adults who have mild-to-moderate COVID-19 and are at high risk for progressing to severe COVID-19, including hospitalization or death. 3. Other therapeutics are currently authorized. For additional information on all products authorized for treatment or prevention of COVID-19, please  see [email or web address].  4. There are benefits and risks of taking this treatment as outlined in the "Fact Sheet for Patients and Caregivers."  5. "Fact Sheet for Patients and Caregivers" was reviewed with patient. A hard copy will be provided to patient from pharmacy prior to the patient receiving treatment. 6. Patients should continue to self-isolate and use infection control measures (e.g., wear mask, isolate, social distance, avoid sharing personal items, clean and disinfect "high touch" surfaces, and frequent handwashing) according to CDC guidelines.  7. The patient or parent/caregiver has the option to accept or refuse treatment. 8. Patient medication history was reviewed for potential drug interactions:Interaction with home meds: Simvistatin 9. Patient's GFR was calculated to be 65, and they were therefore prescribed Normal dose (GFR>60) - nirmatrelvir 150mg  tab (2 tablet) by mouth twice daily AND ritonavir 100mg  tab (1 tablet) by mouth twice daily   After reviewing above information with the patient, the patient agrees to receive Paxlovid.  Follow up instructions:    .  Take prescription BID x 5 days as directed . Reach out to pharmacist for counseling on medication if desired . For concerns regarding further COVID symptoms please follow up with your PCP or urgent care . For urgent or life-threatening issues, seek care at your local emergency department  The patient was provided an opportunity to ask questions, and all were answered. The patient agreed with the plan and demonstrated an understanding of the instructions.   Script sent to [facility] and opted to pick up RX.  The patient was advised to call their PCP or seek an in-person evaluation if the symptoms worsen or if the condition fails to improve as anticipated.   I provided 15 minutes of non face-to-face telephone visit time  during this encounter, and > 50% was spent counseling as documented under my assessment & plan.  Kathrine Haddock, NP 04/30/2020 /12:52 PM  Stop Simvistatin while taking medication

## 2020-05-02 ENCOUNTER — Encounter: Payer: Medicare HMO | Admitting: Internal Medicine

## 2020-05-02 DIAGNOSIS — I255 Ischemic cardiomyopathy: Secondary | ICD-10-CM

## 2020-05-02 DIAGNOSIS — I5022 Chronic systolic (congestive) heart failure: Secondary | ICD-10-CM

## 2020-05-02 DIAGNOSIS — I447 Left bundle-branch block, unspecified: Secondary | ICD-10-CM

## 2020-05-02 DIAGNOSIS — I472 Ventricular tachycardia: Secondary | ICD-10-CM

## 2020-05-02 DIAGNOSIS — Z95810 Presence of automatic (implantable) cardiac defibrillator: Secondary | ICD-10-CM

## 2020-05-03 ENCOUNTER — Other Ambulatory Visit: Payer: Medicare HMO

## 2020-05-03 NOTE — Progress Notes (Signed)
Remote ICD transmission.   

## 2020-05-16 ENCOUNTER — Ambulatory Visit (INDEPENDENT_AMBULATORY_CARE_PROVIDER_SITE_OTHER): Payer: Medicare HMO

## 2020-05-16 ENCOUNTER — Encounter (HOSPITAL_COMMUNITY): Payer: Self-pay

## 2020-05-16 ENCOUNTER — Telehealth (HOSPITAL_COMMUNITY): Payer: Self-pay | Admitting: Pharmacy Technician

## 2020-05-16 DIAGNOSIS — I5022 Chronic systolic (congestive) heart failure: Secondary | ICD-10-CM

## 2020-05-16 DIAGNOSIS — Z95810 Presence of automatic (implantable) cardiac defibrillator: Secondary | ICD-10-CM

## 2020-05-16 NOTE — Progress Notes (Signed)
Medication Samples have been provided to the patient.  Drug name: Wilder Glade       Strength: 10 mg        Qty: 4  LOT: [id number]  Exp.Date: 08/29/2022  Dosing instructions: Take 1 tablet daily  The patient has been instructed regarding the correct time, dose, and frequency of taking this medication, including desired effects and most common side effects.   [name] 1:07 PM 05/16/2020

## 2020-05-16 NOTE — Telephone Encounter (Signed)
Advanced Heart Failure Patient Advocate Encounter   Patient was approved to receive Farxiga from [employer]. [phone]  Patient ID: [id number] Effective dates: 05/12/20 through 01/28/21  Rx was sent to the pharmacy on 4/14 and can take up to 15 business days to receive the medication.   Called and spoke with the patient. He is aware of approval and requested some samples to get him through until the shipment arrives. Neville Route (RN), a message requesting samples.  Advised him to call me with any issues.  [name], CPhT

## 2020-05-18 NOTE — Progress Notes (Signed)
EPIC Encounter for ICM Monitoring  Patient Name: [name] is a 80 y.o. male Date: 05/18/2020 Primary Care Physican: [name], MD Primary Cardiologist:Kelly/Bensimhon Electrophysiologist: [name] BiV Pacing:97.3% 4/20/2022Weight: 131lbs  Time in AT/AF <0.1 hr/day (<0.1%) (on Coumadin)   Spoke with patient and reports feeling well at this time.  Denies fluid symptoms.    Optivol thoracic impedancesuggestingnormal fluid levels.  Prescribed:  Torsemide 20 mg 1 tablettree times a week Mon, Wed &Friday. Can take extra as needed  Potassium 10 mEq take 0.5 tabletthree times a week Mon, Wed &Friday. Can take extra as needed  Spironolactone 25 mg take 1 table daily  Labs: 10/16/2019 Creatinine 0.89, BUN 12, Potassium 4.0, Sodium 137, GFR>60 07/13/2019 Creatinine 0.82, BUN 14, Potassium 3.8, Sodium 135, GFR >60 05/18/2019 Creatinine 0.88, BUN 15, Potassium 3.9, Sodium 135, GFR >60 05/11/2019 Creatinine 0.92, BUN 22, Potassium 3.4, Sodium 137, GFR 79-91 01/13/2021Creatinine0.92, BUN 16, Potassium4.6, Sodium 134, GFR >60 A complete set of results can be found in Results Review  Recommendations:No changes and encouraged to call if experiencing any fluid symptoms.  Follow-up plan: ICM clinic phone appointment on6/13/2022. 91 day device clinic remote transmission6/23/2022.   EP/Cardiology Office Visits: 06/23/2020 with Dr [name]. 5/4/2022with Dr.Benismhon.   Copy of ICM check sent to [place].   3 month ICM trend: 05/16/2020.    1 Year ICM trend:       [name], RN 05/18/2020 2:03 PM

## 2020-05-23 DIAGNOSIS — J449 Chronic obstructive pulmonary disease, unspecified: Secondary | ICD-10-CM | POA: Diagnosis not present

## 2020-05-23 DIAGNOSIS — N400 Enlarged prostate without lower urinary tract symptoms: Secondary | ICD-10-CM | POA: Diagnosis not present

## 2020-05-23 DIAGNOSIS — I2510 Atherosclerotic heart disease of native coronary artery without angina pectoris: Secondary | ICD-10-CM | POA: Diagnosis not present

## 2020-05-23 DIAGNOSIS — I480 Paroxysmal atrial fibrillation: Secondary | ICD-10-CM | POA: Diagnosis not present

## 2020-05-23 DIAGNOSIS — I5023 Acute on chronic systolic (congestive) heart failure: Secondary | ICD-10-CM | POA: Diagnosis not present

## 2020-05-23 DIAGNOSIS — K219 Gastro-esophageal reflux disease without esophagitis: Secondary | ICD-10-CM | POA: Diagnosis not present

## 2020-05-23 DIAGNOSIS — E782 Mixed hyperlipidemia: Secondary | ICD-10-CM | POA: Diagnosis not present

## 2020-05-23 DIAGNOSIS — J45909 Unspecified asthma, uncomplicated: Secondary | ICD-10-CM | POA: Diagnosis not present

## 2020-05-23 DIAGNOSIS — I10 Essential (primary) hypertension: Secondary | ICD-10-CM | POA: Diagnosis not present

## 2020-05-31 NOTE — Progress Notes (Signed)
Patient ID: [name], male   DOB: 1940-12-18, 80 y.o.   MRN: [id number]    Advanced Heart Failure Clinic Note   Date:  06/01/2020   ID:  Jason, [name] 07/21/1940, MRN [id number]  PCP:  [name], MD  Primary Cardiologist:  [name] Referring: Dr. [name]   History of Present Illness: NAZAR [name] is a 80 y.o. 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 In September 1998 he underwent CABG  after an unsuccessful attempt at stenting of his proximal LAD by [name]. Ejection fraction was 25-30%. He is s/p ICD with upgrade to CRT-D   Myoview 11/13  in November 2013 showed a large area of scar in the LAD territory (extent 44%) involving the mid to apical anterior,apical and infero-apical to mid infero-septal and apical lateral wall without associated ischemia.  Admitted 5/16 atrial fibrillation and started on Tikosyn. He was enrolled in the genetic AF trial (bucindolol vs Toprol). He did poorly in the trial in the setting of titration of beta-blocker. He was referred to HF Clinic  Echo 4/21 EF 20% LV markedly dilated. Severe MR  RV moderately reduced   I have reviewed echo with Dr. Copper regarding MitraClip and felt to be too advanced  Had mild COVID in April 2022.   He presents today for follow up with his wife. Says he is doing pretty well. Says IBS is a little better. Appetite ok but has lost 7 pounds this year. No edema, orthopnea or PND. Taking torsemide M/W/F/Sat. Does not need extra. Occasionally lightheaded. No CP.   ICD interrogated in clinic. No VT/AF. Volume ok. Activity level 2hr/day Personally reviewed   CPX 5/21  FVC 3.17 (89%)    FEV1 2.35 (88%)     FEV1/FVC 74 (98%)     MVV 71 (66%)   Resting HR: 69 Standing HR: 72 Peak HR: 105  (74% age predicted max HR)  BP rest: 104/58 Standing BP: 90/56 BP peak: 112/60  Peak VO2: 15.5 (66% predicted peak VO2)   VE/VCO2 slope: 43  OUES: 1.12  Peak RER: 1.11   Ventilatory Threshold: 13.6 (57% predicted or measured peak VO2)  VE/MVV: 71%  O2pulse: 9  (90% predicted O2pulse)   Echo 04/04/17 EF 20% with mild to mod reduction RV function.   CPX 02/17/16 Resting HR: 73 Peak HR: 108  (75% age predicted max HR) BP rest: 110/58 BP peak: 150/56 Peak VO2: 14.4 (58% predicted peak VO2) VE/VCO2 slope: 33 OUES: 1.32 Peak RER: 1.10 PETCO2 at peak: 33 O2pulse: 9  (82% predicted O2pulse)  Echo 12/17 EF 15-20%, Moderate MR, RV with reduced function Echo 5/17 EF 20% RV mildly down.  Echo 3/19: EF 20%, RV mild to moderate reduced function    Labs 10/04/15 K 5.3, Creatinine 0.85, BUN 15  Past Medical History:  Diagnosis Date  . Acute on chronic systolic CHF (congestive heart failure) ([place]) 06/09/2015  . 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 Cardiomyopathy, ischemic 2011   with EF 25-35% by echo  . Coronary artery disease    Hx MI 1992, CABG 1998 , Nuc study 11.2013 large scar but no ischemia  . GERD (gastroesophageal reflux disease)   . 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 NSVT (nonsustained ventricular tachycardia) ([place])   . Paroxysmal atrial fibrillation ([facility])  Current Outpatient Medications  Medication Sig Dispense Refill  . acetaminophen (TYLENOL) 650 MG CR tablet Take 650 mg by mouth 2 (two) times daily.     Marland Kitchen allopurinol (ZYLOPRIM) 300 MG tablet Take 300 mg by mouth daily.    . bisoprolol (ZEBETA) 5 MG tablet Take 0.5 tablets (2.5 mg total) by mouth daily. 45 tablet 3  . cetirizine (ZYRTEC) 10 MG tablet Take 10 mg by mouth daily.    . Cholecalciferol (VITAMIN D) 2000 units CAPS Take 2,000-4,000 capsules by mouth See admin instructions. 4000 units MWF, 2000 units all other days    . dapagliflozin propanediol (FARXIGA) 10 MG TABS tablet Take 1 tablet (10 mg total) by mouth daily before  breakfast. 90 tablet 3  . dicyclomine (BENTYL) 10 MG capsule TAKE 1 CAPSULE EVERY 8 HOURS AS NEEDED FOR SPASMS 60 capsule 2  . isosorbide mononitrate (IMDUR) 30 MG 24 hr tablet Take 0.5 tablets (15 mg total) by mouth daily. 45 tablet 0  . LORazepam (ATIVAN) 1 MG tablet Take 0.25-0.5 mg by mouth at bedtime.    Marland Kitchen losartan (COZAAR) 25 MG tablet TAKE ONE TABLET BY MOUTH TWICE A DAY 180 tablet 3  . magnesium oxide (MAG-OX) 400 MG tablet Take 400 mg by mouth daily at 12 noon.     . methylcellulose (CITRUCEL) oral powder Take as directed    . mometasone (ASMANEX) 220 MCG/INH inhaler Inhale 1 puff into the lungs daily.    . montelukast (SINGULAIR) 10 MG tablet Take 1 tablet (10 mg total) by mouth at bedtime. PLEASE SCHEDULE APPOINTMENT. 30 tablet 0  . pantoprazole (PROTONIX) 20 MG tablet Take 1 tablet (20 mg total) by mouth 2 (two) times daily. 180 tablet 1  . Polyethyl Glycol-Propyl Glycol (SYSTANE OP) Apply 2-3 drops to eye 3 (three) times daily as needed (dry eyes).    . potassium chloride (KLOR-CON) 10 MEQ tablet Take 0.5 tablets (5 mEq total) by mouth Monday, Wednesday & Friday. can take extra as needed 45 tablet 0  . spironolactone (ALDACTONE) 25 MG tablet TAKE 1 TABLET EVERY DAY 90 tablet 3  . tamsulosin (FLOMAX) 0.4 MG CAPS capsule Take 0.4 mg by mouth daily.    Marland Kitchen torsemide (DEMADEX) 20 MG tablet Take 1 tablet (20 mg total) by mouth as directed. Take medication Monday Wednesday and Friday and Saturday 45 tablet 3  . Vitamins-Lipotropics (LIPO FLAVONOID PLUS) TABS Take 3 tablets by mouth daily in the afternoon.    . warfarin (COUMADIN) 5 MG tablet Take 1/2 a tablet to 1 tablet by mouth daily as directed by the coumadin clinic 90 tablet 0   No current facility-administered medications for this encounter.    Allergies:    Allergies  Allergen Reactions  . Amoxicillin Rash    Has patient had a PCN reaction causing immediate rash, facial/tongue/throat swelling, SOB or lightheadedness with  hypotension: Yes Has patient had a PCN reaction causing severe rash involving mucus membranes or skin necrosis: No Has patient had a PCN reaction that required hospitalization: No Has patient had a PCN reaction occurring within the last 10 years: No If all of the above answers are "NO", then may proceed with Cephalosporin use.   Marland Kitchen Penicillins Rash    Has patient had a PCN reaction causing immediate rash, facial/tongue/throat swelling, SOB or lightheadedness with hypotension: Yes Has patient had a PCN reaction causing severe rash involving mucus membranes or skin necrosis: No Has patient had a PCN reaction that required hospitalization: No Has patient had  a PCN reaction occurring within the last 10 years: No If all of the above answers are "NO", then may proceed with Cephalosporin use.     Social History:  The patient  reports that he has never smoked. He has never used smokeless tobacco. He reports current alcohol use of about 2.0 standard drinks of alcohol per week. He reports that he does not use drugs.   Family history:   Family History  Problem Relation Age of Onset  . Heart disease Father        questionable  . Stroke Mother   . Heart failure Sister   . Healthy Brother   . Healthy Sister   . Parkinsonism Brother   . Colon cancer Neg Hx    Review of systems complete and found to be negative unless listed in HPI.    PHYSICAL EXAM: Vitals:   06/01/20 1107  BP: 104/60  Pulse: 61  SpO2: 96%  Weight: 58 kg (127 lb 12.8 oz)   Wt Readings from Last 3 Encounters:  06/01/20 58 kg (127 lb 12.8 oz)  04/14/20 60.1 kg (132 lb 6.4 oz)  03/02/20 61.1 kg (134 lb 9.6 oz)   General:  Thin appearing. No resp difficulty HEENT: normal Neck: supple. no JVD. Carotids 2+ bilat; no bruits. No lymphadenopathy or thryomegaly appreciated. Cor: PMI nondisplaced. Regular rate & rhythm. 3/6 MR Lungs: clear Abdomen: soft, nontender, nondistended. No hepatosplenomegaly. No bruits or masses. Good  bowel sounds. Extremities: no cyanosis, clubbing, rash, edema Neuro: alert & orientedx3, cranial nerves grossly intact. moves all 4 extremities w/o difficulty. Affect pleasant    ASSESSMENT AND PLAN:  1. 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 PVO2 improved since 2018 but VEVCO2 elevated suggestive of high pulmonary pressures with exercise - Echo 05/18/19 EF 20% with moderate RV dysfunction. LV markedly dilated LVIDd 7.0 cm Severe MR, mod AI, mod TR.  - Stable NYHA III symptoms in face of severe biV dysfunction. - Still getting 3-5k per day - Rash after taking Entresto.  - Continue losartan 25 bid - Continue Farxiga 10 - Continue spiro 25 mg daily - Continue bisoprolol 2.5 mg daily.  - Unable to tolerate higher doses of GDMT with low BP - Fluid level looks great. Continue torsemide 20mg  M/W/F/Sat . We did discuss the fact that he is not candidate for advanced therapies including transplant (age), VAD (size and RV dysfunction). MitraClip (LV failure too advanced)  - Will screen for Barostim - ICD interrogated persoanlly in clinic  2. PAF - Maintaining NSR on exam and ICD - Continue coumadin. Does not want Eliquis - No bleeding    3. CAD s/p previous anterior infarct - No s/s ischemia - Off ASA due to warfarin. Continue statin. - LDL followed by PCP  4. LBBB - Now s/p CRT. 100% biv pacing on device today - No change  5. Severe MR - not candidate for Clip  6. Weight loss - encouraged increased calorie intake   Glori Bickers, MD  11:24 AM

## 2020-06-01 ENCOUNTER — Ambulatory Visit (HOSPITAL_COMMUNITY)
Admission: RE | Admit: 2020-06-01 | Discharge: 2020-06-01 | Disposition: A | Discharge status: Home / Self Care | Payer: Medicare HMO | Source: Ambulatory Visit | Attending: Internal Medicine | Admitting: Internal Medicine

## 2020-06-01 ENCOUNTER — Other Ambulatory Visit: Payer: Self-pay

## 2020-06-01 ENCOUNTER — Ambulatory Visit
Admission: RE | Admit: 2020-06-01 | Discharge: 2020-06-01 | Disposition: A | Discharge status: Home / Self Care | Payer: Medicare HMO | Source: Ambulatory Visit | Attending: Nurse Practitioner | Admitting: Nurse Practitioner

## 2020-06-01 ENCOUNTER — Encounter (HOSPITAL_COMMUNITY): Payer: Self-pay | Admitting: Internal Medicine

## 2020-06-01 VITALS — BP 104/60 | HR 61 | Wt 127.8 lb

## 2020-06-01 DIAGNOSIS — R42 Dizziness and giddiness: Secondary | ICD-10-CM | POA: Diagnosis not present

## 2020-06-01 DIAGNOSIS — Z8249 Family history of ischemic heart disease and other diseases of the circulatory system: Secondary | ICD-10-CM | POA: Diagnosis not present

## 2020-06-01 DIAGNOSIS — I255 Ischemic cardiomyopathy: Secondary | ICD-10-CM | POA: Diagnosis not present

## 2020-06-01 DIAGNOSIS — R1011 Right upper quadrant pain: Secondary | ICD-10-CM

## 2020-06-01 DIAGNOSIS — I340 Nonrheumatic mitral (valve) insufficiency: Secondary | ICD-10-CM | POA: Diagnosis not present

## 2020-06-01 DIAGNOSIS — Z79899 Other long term (current) drug therapy: Secondary | ICD-10-CM | POA: Insufficient documentation

## 2020-06-01 DIAGNOSIS — Z7984 Long term (current) use of oral hypoglycemic drugs: Secondary | ICD-10-CM | POA: Diagnosis not present

## 2020-06-01 DIAGNOSIS — I447 Left bundle-branch block, unspecified: Secondary | ICD-10-CM | POA: Diagnosis not present

## 2020-06-01 DIAGNOSIS — I252 Old myocardial infarction: Secondary | ICD-10-CM | POA: Diagnosis not present

## 2020-06-01 DIAGNOSIS — Z951 Presence of aortocoronary bypass graft: Secondary | ICD-10-CM | POA: Insufficient documentation

## 2020-06-01 DIAGNOSIS — K589 Irritable bowel syndrome without diarrhea: Secondary | ICD-10-CM | POA: Diagnosis not present

## 2020-06-01 DIAGNOSIS — I2510 Atherosclerotic heart disease of native coronary artery without angina pectoris: Secondary | ICD-10-CM | POA: Diagnosis not present

## 2020-06-01 DIAGNOSIS — Z8616 Personal history of COVID-19: Secondary | ICD-10-CM | POA: Insufficient documentation

## 2020-06-01 DIAGNOSIS — Z7901 Long term (current) use of anticoagulants: Secondary | ICD-10-CM | POA: Diagnosis not present

## 2020-06-01 DIAGNOSIS — I5022 Chronic systolic (congestive) heart failure: Secondary | ICD-10-CM | POA: Insufficient documentation

## 2020-06-01 DIAGNOSIS — I480 Paroxysmal atrial fibrillation: Secondary | ICD-10-CM | POA: Diagnosis not present

## 2020-06-01 DIAGNOSIS — K219 Gastro-esophageal reflux disease without esophagitis: Secondary | ICD-10-CM | POA: Diagnosis not present

## 2020-06-01 DIAGNOSIS — Z95810 Presence of automatic (implantable) cardiac defibrillator: Secondary | ICD-10-CM | POA: Insufficient documentation

## 2020-06-01 LAB — CBC
HCT: 45.4 % (ref 39.0–52.0)
Hemoglobin: 14.4 g/dL (ref 13.0–17.0)
MCH: 29.1 pg (ref 26.0–34.0)
MCHC: 31.7 g/dL (ref 30.0–36.0)
MCV: 91.7 fL (ref 80.0–100.0)
Platelets: 233 10*3/uL (ref 150–400)
RBC: 4.95 MIL/uL (ref 4.22–5.81)
RDW: 17.3 % — ABNORMAL HIGH (ref 11.5–15.5)
WBC: 6.6 10*3/uL (ref 4.0–10.5)
nRBC: 0 % (ref 0.0–0.2)

## 2020-06-01 LAB — BASIC METABOLIC PANEL
Anion gap: 8 (ref 5–15)
BUN: 17 mg/dL (ref 8–23)
CO2: 27 mmol/L (ref 22–32)
Calcium: 9.3 mg/dL (ref 8.9–10.3)
Chloride: 99 mmol/L (ref 98–111)
Creatinine, Ser: 0.86 mg/dL (ref 0.61–1.24)
GFR, Estimated: >60 mL/min (ref >60)
Glucose, Bld: 83 mg/dL (ref 70–99)
Potassium: 3.6 mmol/L (ref 3.5–5.1)
Sodium: 134 mmol/L — ABNORMAL LOW (ref 135–145)

## 2020-06-01 LAB — BRAIN NATRIURETIC PEPTIDE: B Natriuretic Peptide: 1712.2 pg/mL — ABNORMAL HIGH (ref 0.0–100.0)

## 2020-06-01 NOTE — Addendum Note (Signed)
Encounter addended by: Jolaine Artist, MD on: 06/01/2020 11:53 AM  Actions taken: Level of Service modified, Visit diagnoses modified, Charge Capture section accepted

## 2020-06-01 NOTE — Patient Instructions (Signed)
Labs done today. We will contact you only if your labs are abnormal.  No medication changes were made. Please continue all current medications as prescribed.  Your physician recommends that you schedule a follow-up appointment in: 4 months.   If you have any questions or concerns before your next appointment please send us a message through mychart or call our office at [phone].    TO LEAVE A MESSAGE FOR THE NURSE SELECT OPTION 2, PLEASE LEAVE A MESSAGE INCLUDING: . YOUR NAME . DATE OF BIRTH . CALL BACK NUMBER . REASON FOR CALL**this is important as we prioritize the call backs  YOU WILL RECEIVE A CALL BACK THE SAME DAY AS LONG AS YOU CALL BEFORE 4:00 PM   Do the following things EVERYDAY: 1) Weigh yourself in the morning before breakfast. Write it down and keep it in a log. 2) Take your medicines as prescribed 3) Eat low salt foods--Limit salt (sodium) to 2000 mg per day.  4) Stay as active as you can everyday 5) Limit all fluids for the day to less than 2 liters   At the [facility], you and your health needs are our priority. As part of our continuing mission to provide you with exceptional heart care, we have created designated Provider Care Teams. These Care Teams include your primary Cardiologist (physician) and Advanced Practice Providers (APPs- Physician Assistants and Nurse Practitioners) who all work together to provide you with the care you need, when you need it.   You may see any of the following providers on your designated Care Team at your next follow up: . Dr Daniel Bensimhon . Dr Dalton McLean . Amy Clegg, NP . Brittainy Simmons, PA . Lauren Kemp, PharmD   Please be sure to bring in all your medications bottles to every appointment.   

## 2020-06-03 ENCOUNTER — Telehealth: Payer: Self-pay | Admitting: Nurse Practitioner

## 2020-06-03 NOTE — Telephone Encounter (Signed)
Called and spoke with radiology, the [name] report is in epic.

## 2020-06-03 NOTE — Telephone Encounter (Signed)
Inbound call from [place] at [facility] to give Ultrasound results from 5/5. Best contact number [phone]

## 2020-06-06 NOTE — Telephone Encounter (Signed)
Vaughan Basta, I sent results to patient's GI [name]. I don't know that the RUQ findings really explain his chronic RUQ pain. I think a HIDA is reasonable but let's see if [name] has any additional thoughts. Thanks

## 2020-06-06 NOTE — Telephone Encounter (Signed)
Noted  

## 2020-06-08 ENCOUNTER — Other Ambulatory Visit: Payer: Self-pay

## 2020-06-08 ENCOUNTER — Ambulatory Visit (INDEPENDENT_AMBULATORY_CARE_PROVIDER_SITE_OTHER): Payer: Medicare HMO

## 2020-06-08 DIAGNOSIS — I4891 Unspecified atrial fibrillation: Secondary | ICD-10-CM | POA: Diagnosis not present

## 2020-06-08 DIAGNOSIS — Z7901 Long term (current) use of anticoagulants: Secondary | ICD-10-CM | POA: Diagnosis not present

## 2020-06-08 DIAGNOSIS — Z5181 Encounter for therapeutic drug level monitoring: Secondary | ICD-10-CM | POA: Diagnosis not present

## 2020-06-08 DIAGNOSIS — I480 Paroxysmal atrial fibrillation: Secondary | ICD-10-CM | POA: Diagnosis not present

## 2020-06-08 LAB — POCT INR: INR: 2.6 (ref 2.0–3.0)

## 2020-06-08 NOTE — Patient Instructions (Signed)
Continue taking 1 tablet daily except 1/2 tablet each Monday, Wednesday and Friday.  Repeat INR in 6 weeks

## 2020-06-09 ENCOUNTER — Other Ambulatory Visit (INDEPENDENT_AMBULATORY_CARE_PROVIDER_SITE_OTHER): Payer: Medicare HMO

## 2020-06-09 ENCOUNTER — Other Ambulatory Visit: Payer: Self-pay

## 2020-06-09 DIAGNOSIS — R1011 Right upper quadrant pain: Secondary | ICD-10-CM

## 2020-06-09 DIAGNOSIS — R140 Abdominal distension (gaseous): Secondary | ICD-10-CM | POA: Diagnosis not present

## 2020-06-09 DIAGNOSIS — K219 Gastro-esophageal reflux disease without esophagitis: Secondary | ICD-10-CM | POA: Diagnosis not present

## 2020-06-09 LAB — CBC WITH DIFFERENTIAL/PLATELET
Basophils Absolute: 0.1 10*3/uL (ref 0.0–0.1)
Basophils Relative: 1.5 % (ref 0.0–3.0)
Eosinophils Absolute: 0.2 10*3/uL (ref 0.0–0.7)
Eosinophils Relative: 3.1 % (ref 0.0–5.0)
HCT: 41.7 % (ref 39.0–52.0)
Hemoglobin: 14 g/dL (ref 13.0–17.0)
Lymphocytes Relative: 14.9 % (ref 12.0–46.0)
Lymphs Abs: 1.1 10*3/uL (ref 0.7–4.0)
MCHC: 33.5 g/dL (ref 30.0–36.0)
MCV: 90.5 fl (ref 78.0–100.0)
Monocytes Absolute: 0.8 10*3/uL (ref 0.1–1.0)
Monocytes Relative: 11.4 % (ref 3.0–12.0)
Neutro Abs: 4.9 10*3/uL (ref 1.4–7.7)
Neutrophils Relative %: 69.1 % (ref 43.0–77.0)
Platelets: 182 10*3/uL (ref 150.0–400.0)
RBC: 4.61 Mil/uL (ref 4.22–5.81)
RDW: 17.5 % — ABNORMAL HIGH (ref 11.5–15.5)
WBC: 7.1 10*3/uL (ref 4.0–10.5)

## 2020-06-09 LAB — HEPATIC FUNCTION PANEL
ALT: 14 U/L (ref 0–53)
AST: 17 U/L (ref 0–37)
Albumin: 4 g/dL (ref 3.5–5.2)
Alkaline Phosphatase: 140 U/L — ABNORMAL HIGH (ref 39–117)
Bilirubin, Direct: 0.8 mg/dL — ABNORMAL HIGH (ref 0.0–0.3)
Total Bilirubin: 1.8 mg/dL — ABNORMAL HIGH (ref 0.2–1.2)
Total Protein: 7.2 g/dL (ref 6.0–8.3)

## 2020-06-13 ENCOUNTER — Other Ambulatory Visit: Payer: Self-pay

## 2020-06-13 ENCOUNTER — Other Ambulatory Visit (HOSPITAL_COMMUNITY): Payer: Self-pay | Admitting: Internal Medicine

## 2020-06-13 DIAGNOSIS — J45909 Unspecified asthma, uncomplicated: Secondary | ICD-10-CM | POA: Diagnosis not present

## 2020-06-13 DIAGNOSIS — I2510 Atherosclerotic heart disease of native coronary artery without angina pectoris: Secondary | ICD-10-CM | POA: Diagnosis not present

## 2020-06-13 DIAGNOSIS — I480 Paroxysmal atrial fibrillation: Secondary | ICD-10-CM | POA: Diagnosis not present

## 2020-06-13 DIAGNOSIS — R1011 Right upper quadrant pain: Secondary | ICD-10-CM

## 2020-06-13 DIAGNOSIS — J449 Chronic obstructive pulmonary disease, unspecified: Secondary | ICD-10-CM | POA: Diagnosis not present

## 2020-06-13 DIAGNOSIS — K219 Gastro-esophageal reflux disease without esophagitis: Secondary | ICD-10-CM | POA: Diagnosis not present

## 2020-06-13 DIAGNOSIS — E7800 Pure hypercholesterolemia, unspecified: Secondary | ICD-10-CM | POA: Diagnosis not present

## 2020-06-13 DIAGNOSIS — I10 Essential (primary) hypertension: Secondary | ICD-10-CM | POA: Diagnosis not present

## 2020-06-13 DIAGNOSIS — I5023 Acute on chronic systolic (congestive) heart failure: Secondary | ICD-10-CM | POA: Diagnosis not present

## 2020-06-13 DIAGNOSIS — E782 Mixed hyperlipidemia: Secondary | ICD-10-CM | POA: Diagnosis not present

## 2020-06-13 DIAGNOSIS — R140 Abdominal distension (gaseous): Secondary | ICD-10-CM

## 2020-06-16 ENCOUNTER — Telehealth: Payer: Self-pay

## 2020-06-16 ENCOUNTER — Telehealth: Payer: Self-pay | Admitting: Gastroenterology

## 2020-06-16 ENCOUNTER — Other Ambulatory Visit: Payer: Medicare HMO

## 2020-06-16 DIAGNOSIS — R1011 Right upper quadrant pain: Secondary | ICD-10-CM

## 2020-06-16 DIAGNOSIS — K219 Gastro-esophageal reflux disease without esophagitis: Secondary | ICD-10-CM

## 2020-06-16 DIAGNOSIS — R140 Abdominal distension (gaseous): Secondary | ICD-10-CM

## 2020-06-16 LAB — HEPATIC FUNCTION PANEL
ALT: 12 U/L (ref 0–53)
AST: 16 U/L (ref 0–37)
Albumin: 4 g/dL (ref 3.5–5.2)
Alkaline Phosphatase: 129 U/L — ABNORMAL HIGH (ref 39–117)
Bilirubin, Direct: 0.9 mg/dL — ABNORMAL HIGH (ref 0.0–0.3)
Total Bilirubin: 2 mg/dL — ABNORMAL HIGH (ref 0.2–1.2)
Total Protein: 6.9 g/dL (ref 6.0–8.3)

## 2020-06-16 NOTE — Telephone Encounter (Signed)
Spoke with patient to remind him that he is due for repeat labs at this time. No appointment is necessary. Patient is aware that he can stop by the lab in the basement at his convenience today. Patient verbalized understanding and had no concerns at the end of the call.   Lab order in epic.

## 2020-06-16 NOTE — Telephone Encounter (Signed)
-----   Message from Yevette Edwards, RN sent at 06/13/2020  8:43 AM EDT ----- Regarding: Labs Hepatic function panel on 06/16/20. Order in epic.

## 2020-06-17 MED ORDER — DICYCLOMINE HCL 10 MG PO CAPS: ORAL_CAPSULE | ORAL | 1 refills | Status: DC

## 2020-06-17 NOTE — Telephone Encounter (Signed)
Refill was sent

## 2020-06-23 ENCOUNTER — Other Ambulatory Visit: Payer: Self-pay

## 2020-06-23 ENCOUNTER — Ambulatory Visit (INDEPENDENT_AMBULATORY_CARE_PROVIDER_SITE_OTHER): Payer: Medicare HMO | Admitting: Internal Medicine

## 2020-06-23 ENCOUNTER — Encounter: Payer: Self-pay | Admitting: Internal Medicine

## 2020-06-23 VITALS — BP 98/56 | HR 66 | Ht 67.0 in | Wt 131.8 lb

## 2020-06-23 DIAGNOSIS — I480 Paroxysmal atrial fibrillation: Secondary | ICD-10-CM

## 2020-06-23 MED ORDER — METOLAZONE 2.5 MG PO TABS: ORAL_TABLET | ORAL | 0 refills | Status: DC

## 2020-06-23 NOTE — Patient Instructions (Addendum)
Medication Instructions:  Your physician has recommended you make the following change in your medication:   You will take Metalozone 2.5mg  - 1 tablet by mouth beginning tomorrow morning x 1 dose along with Potassium 31meq (4 tablets 67meq) x 1 dose.   *If you need a refill on your cardiac medications before your next appointment, please call your pharmacy*   Lab Work: None ordered.  If you have labs (blood work) drawn today and your tests are completely normal, you will receive your results only by: Marland Kitchen MyChart Message (if you have MyChart) OR . A paper copy in the mail If you have any lab test that is abnormal or we need to change your treatment, we will call you to review the results.   Testing/Procedures: None ordered.    Follow-Up: At [facility], you and your health needs are our priority.  As part of our continuing mission to provide you with exceptional heart care, we have created designated Provider Care Teams.  These Care Teams include your primary Cardiologist (physician) and Advanced Practice Providers (APPs -  Physician Assistants and Nurse Practitioners) who all work together to provide you with the care you need, when you need it.  We recommend signing up for the patient portal called "MyChart".  Sign up information is provided on this After Visit Summary.  MyChart is used to connect with patients for Virtual Visits (Telemedicine).  Patients are able to view lab/test results, encounter notes, upcoming appointments, etc.  Non-urgent messages can be sent to your provider as well.   To learn more about what you can do with MyChart, go to [email or web address].    Your next appointment:   12 months with Dr [name]

## 2020-06-23 NOTE — Progress Notes (Signed)
Patient Care Team: [name], MD as PCP - General (Family Medicine) Troy Sine, MD as PCP - Cardiology (Cardiology)   HPI  [name] is a 80 y.o. male followup for ICD implanted for primary prevention in the setting of ischemic heart disease and for which he underwent CRT upgrade in 2018 He has a history of bypass surgery.  His last nuclear perfusion study was in November 2013 which showed a large area of scar in the LAD territory (extent 44%) involving the mid to apical anterior, apical and infero-apical to mid infero-septal and apical lateral wall without associated ischemia.  History of atrial fibrillation and anticoagulated with warfarin.  DATE TEST EF   3/16 Echo 20-25 %   12/17 Echo  20-25 %   3/19 Echo  20-25%   6/21 TEE 20%     Date Cr K Hgb  8/18   12.6  6/19 0.89 3.5    2/20 1.00 3.7 14.4  5/22 0.86 3.6 14.0<<14.4     Today he is accompanied by his wife, who also provides some history. He is feeling good overall. They are concerned about his recent weight loss. His goal is to stay at or above 130 pounds. When climbing stairs he gets slightly short of breath, but this is unchanged from one year ago. His wife reports he sometimes complains of shortness of breath at rest or exertion. Complains of occasional shortness of breath at rest or exertion, including when sitting and climbing stairs. He denies any bleeding issues. Since his last appointment he has developed gallstones. The patient denies chest pain, nocturnal dyspnea, orthopnea or peripheral edema.  There have been no palpitations, lightheadedness or syncope.  Wt Readings from Last 3 Encounters:  06/23/20 131 lb 12.8 oz (59.8 kg)  06/01/20 127 lb 12.8 oz (58 kg)  04/14/20 132 lb 6.4 oz (60.1 kg)    Past Medical History:  Diagnosis Date  . Acute on chronic systolic CHF (congestive heart failure) ([place]) 06/09/2015  . Automatic implantable cardiac defibrillator in situ 2002; 2010   medtronic  virtuso  . Cardiomyopathy, ischemic 2011   with EF 25-35% by echo  . Coronary artery disease    Hx MI 1992, CABG 1998 , Nuc study 11.2013 large scar but no ischemia  . GERD (gastroesophageal reflux disease)   . 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 NSVT (nonsustained ventricular tachycardia) ([place])   . Paroxysmal atrial fibrillation [facility])     Past Surgical History:  Procedure Laterality Date  . BIOPSY  11/04/2018   Procedure: BIOPSY;  Surgeon: Yetta Flock, MD;  Location: [facility];  Service: Gastroenterology;;  . Zollie Beckers N/A 09/10/2016   Procedure: BiV ICD Upgrade;  Surgeon: Deboraha Sprang, MD;  Location: [place] [facility] CV LAB;  Service: Cardiovascular;  Laterality: N/A;  . CATARACT EXTRACTION, BILATERAL    . COLONOSCOPY WITH PROPOFOL N/A 11/04/2018   Procedure: COLONOSCOPY WITH PROPOFOL;  Surgeon: Yetta Flock, MD;  Location: [facility] ENDOSCOPY;  Service: Gastroenterology;  Laterality: N/A;  . CORONARY ARTERY BYPASS GRAFT  1998   x 5  . HERNIA REPAIR  1960's   right inguinal  . ICD GENERATOR CHANGE  01/2008, 2018   medtronic, hx+ EP study  . KNEE SURGERY  1960s   right, acl repair   . NASAL SINUS SURGERY  05/31/2010   Dr. Benjamine Mola  . POLYPECTOMY  11/04/2018   Procedure: POLYPECTOMY;  Surgeon: Yetta Flock, MD;  Location: [facility];  Service: Gastroenterology;;  . TEE WITHOUT CARDIOVERSION N/A 07/21/2019   Procedure: TRANSESOPHAGEAL ECHOCARDIOGRAM (TEE);  Surgeon: Jolaine Artist, MD;  Location: [facility] ENDOSCOPY;  Service: Cardiovascular;  Laterality: N/A;  . UPPER GI ENDOSCOPY      Current Outpatient Medications  Medication Sig Dispense Refill  . acetaminophen (TYLENOL) 650 MG CR tablet Take 650 mg by mouth 2 (two) times daily.     Marland Kitchen allopurinol (ZYLOPRIM) 300 MG tablet Take 300 mg by mouth daily.    . bisoprolol (ZEBETA) 5 MG tablet Take 0.5 tablets (2.5 mg total) by mouth daily. 45 tablet 3  . cetirizine (ZYRTEC) 10  MG tablet Take 10 mg by mouth daily.    . Cholecalciferol (VITAMIN D) 2000 units CAPS Take 2,000-4,000 capsules by mouth See admin instructions. 4000 units MWF, 2000 units all other days    . dapagliflozin propanediol (FARXIGA) 10 MG TABS tablet Take 1 tablet (10 mg total) by mouth daily before breakfast. 90 tablet 3  . dicyclomine (BENTYL) 10 MG capsule TAKE 1 CAPSULE EVERY 8 HOURS AS NEEDED FOR SPASMS 180 capsule 1  . isosorbide mononitrate (IMDUR) 30 MG 24 hr tablet Take 0.5 tablets (15 mg total) by mouth daily. 45 tablet 0  . LORazepam (ATIVAN) 1 MG tablet Take 0.25-0.5 mg by mouth at bedtime.    Marland Kitchen losartan (COZAAR) 25 MG tablet TAKE ONE TABLET BY MOUTH TWICE A DAY 180 tablet 3  . magnesium oxide (MAG-OX) 400 MG tablet Take 400 mg by mouth daily at 12 noon.     . methylcellulose (CITRUCEL) oral powder Take as directed    . mometasone (ASMANEX) 220 MCG/INH inhaler Inhale 1 puff into the lungs daily.    . montelukast (SINGULAIR) 10 MG tablet Take 1 tablet (10 mg total) by mouth at bedtime. PLEASE SCHEDULE APPOINTMENT. 30 tablet 0  . pantoprazole (PROTONIX) 20 MG tablet Take 1 tablet (20 mg total) by mouth 2 (two) times daily. 180 tablet 1  . Polyethyl Glycol-Propyl Glycol (SYSTANE OP) Apply 2-3 drops to eye 3 (three) times daily as needed (dry eyes).    . potassium chloride (KLOR-CON) 10 MEQ tablet Take 0.5 tablets (5 mEq total) by mouth Monday, Wednesday & Friday. can take extra as needed 45 tablet 0  . spironolactone (ALDACTONE) 25 MG tablet TAKE 1 TABLET EVERY DAY 90 tablet 3  . tamsulosin (FLOMAX) 0.4 MG CAPS capsule Take 0.4 mg by mouth daily.    Marland Kitchen torsemide (DEMADEX) 20 MG tablet Take 1 tablet (20 mg total) by mouth as directed. Take medication Monday Wednesday and Friday and Saturday 45 tablet 3  . Vitamins-Lipotropics (LIPO FLAVONOID PLUS) TABS Take 3 tablets by mouth daily in the afternoon.    . warfarin (COUMADIN) 5 MG tablet Take 1/2 a tablet to 1 tablet by mouth daily as directed by  the coumadin clinic 90 tablet 0   No current facility-administered medications for this visit.    Allergies  Allergen Reactions  . Other Other (See Comments)  . Amoxicillin Rash and Other (See Comments)    Has patient had a PCN reaction causing immediate rash, facial/tongue/throat swelling, SOB or lightheadedness with hypotension: Yes Has patient had a PCN reaction causing severe rash involving mucus membranes or skin necrosis: No Has patient had a PCN reaction that required hospitalization: No Has patient had a PCN reaction occurring within the last 10 years: No If all of the above answers are "NO", then may proceed  with Cephalosporin use.   Marland Kitchen Penicillins Rash    Has patient had a PCN reaction causing immediate rash, facial/tongue/throat swelling, SOB or lightheadedness with hypotension: Yes Has patient had a PCN reaction causing severe rash involving mucus membranes or skin necrosis: No Has patient had a PCN reaction that required hospitalization: No Has patient had a PCN reaction occurring within the last 10 years: No If all of the above answers are "NO", then may proceed with Cephalosporin use.     Review of Systems negative except from HPI and PMH  Physical Exam BP (!) 98/56   Pulse 66   Ht 5\' 7"  (1.702 m)   Wt 131 lb 12.8 oz (59.8 kg)   SpO2 99%   BMI 20.64 kg/m  Well developed and well nourished in no acute distress HENT normal Neck supple with JVP-10 Clear Device pocket well healed; without hematoma or erythema.  There is no tethering  Regular rate and rhythm, 3/6 murmur Abd-soft with active BS No Clubbing cyanosis tr edema Skin-warm and dry A & Oriented  Grossly normal sensory and motor function  ECG AV pacing with a negative QRS morphology lead I and RS in lead V1 device is a programmed adaptive BiV   Assessment and  Plan  Atrial fibrillation-paroxysmal  Sinus bradycardia/ chronotropic incompetence  Ischemic cardiomyopathy  IVCD  Congestive heart  failure-chronic-systolic  CRT ICD Medtronic  T   No intercurrent atrial fibrillation.  Amazingly.  Continue him on Coumadin for anticoagulation  Heart failure status is somewhat unstable.  He has significant neck vein distention in the setting of known severe MR.  Wonder whether he would benefit from more aggressive diuresis. He has moderate RV dysfunction and moderate TR but this does not appear to be just V waves.  Have discussed with  Dr. Reine Just to consider augmented diuresis.  We will give him metolazone 2.5 mg with extra 40 of potassium.   for now we will continue his Farxiga losartan and bisoprolol.   No intercurrent ventricular tachycardia.  Heart rate excursion is adequate   I,Mathew Stumpf,acting as a scribe for Virl Axe, MD.,have documented all relevant documentation on the behalf of Virl Axe, MD,as directed by  Virl Axe, MD while in the presence of Virl Axe, MD.  Meda Coffee, Virl Axe, MD, have reviewed all documentation for this visit. The documentation on 06/29/20 for the exam, diagnosis, procedures, and orders are all accurate and complete.

## 2020-06-27 ENCOUNTER — Other Ambulatory Visit (HOSPITAL_COMMUNITY): Payer: Self-pay | Admitting: Internal Medicine

## 2020-06-28 ENCOUNTER — Other Ambulatory Visit: Payer: Self-pay | Admitting: Surgery

## 2020-06-28 DIAGNOSIS — K8020 Calculus of gallbladder without cholecystitis without obstruction: Secondary | ICD-10-CM | POA: Diagnosis not present

## 2020-06-28 DIAGNOSIS — K4090 Unilateral inguinal hernia, without obstruction or gangrene, not specified as recurrent: Secondary | ICD-10-CM | POA: Diagnosis not present

## 2020-06-30 ENCOUNTER — Other Ambulatory Visit (HOSPITAL_COMMUNITY): Payer: Self-pay | Admitting: Internal Medicine

## 2020-07-05 DIAGNOSIS — R972 Elevated prostate specific antigen [PSA]: Secondary | ICD-10-CM | POA: Diagnosis not present

## 2020-07-06 ENCOUNTER — Other Ambulatory Visit: Payer: Self-pay

## 2020-07-06 ENCOUNTER — Other Ambulatory Visit (HOSPITAL_COMMUNITY): Payer: Self-pay | Admitting: Internal Medicine

## 2020-07-06 DIAGNOSIS — J449 Chronic obstructive pulmonary disease, unspecified: Secondary | ICD-10-CM | POA: Insufficient documentation

## 2020-07-06 DIAGNOSIS — E559 Vitamin D deficiency, unspecified: Secondary | ICD-10-CM | POA: Insufficient documentation

## 2020-07-06 DIAGNOSIS — I5023 Acute on chronic systolic (congestive) heart failure: Secondary | ICD-10-CM | POA: Insufficient documentation

## 2020-07-06 DIAGNOSIS — I10 Essential (primary) hypertension: Secondary | ICD-10-CM | POA: Insufficient documentation

## 2020-07-07 ENCOUNTER — Encounter: Payer: Self-pay | Admitting: Nurse Practitioner

## 2020-07-07 ENCOUNTER — Ambulatory Visit (INDEPENDENT_AMBULATORY_CARE_PROVIDER_SITE_OTHER): Payer: Medicare HMO | Admitting: Nurse Practitioner

## 2020-07-07 VITALS — BP 84/50 | HR 68 | Ht 65.0 in | Wt 131.5 lb

## 2020-07-07 DIAGNOSIS — K8020 Calculus of gallbladder without cholecystitis without obstruction: Secondary | ICD-10-CM | POA: Diagnosis not present

## 2020-07-07 DIAGNOSIS — R1011 Right upper quadrant pain: Secondary | ICD-10-CM

## 2020-07-07 NOTE — Progress Notes (Signed)
07/07/2020 [name] [id number] 1940/06/08   Chief Complaint:  RUQ pain   History of Present Illness:  [name]. [name] is an 80 year old male with a past medical history of CAD, MI 64, CHF with LVEF 20%, ischemic cardiomyopathy, severe MR, AICD, LBBB, s/p CABG 1998 and atrial fibrillation on Coumadin. Mild Covid 19 04/2020.   He presents to our office today for follow up regarding chronic RUQ pain. He is followed by [name]. He was last seen in office by [name]  NP 04/14/2020 for follow up for chronic RUQ pain and IBS. A RUQ sonogram was ordered and was eventually done on 06/01/2020 which showed a distended gallbladder with gallstones and a probable stone in the cystic duct vs a possible 73m gallbladder polyps verses an adherent stone. Mild diffuse gallbladder wall thickening was noted. He was referred to general surgery for further evaluation. He stated seeing Dr. [name] few weeks ago and gallbladder surgery was discussed but Mr. GBarneydid not wish to purse any surgery.  I will request a copy of his general surgery consult note, not yet received in Epic/Media. He stated his cardiologist recommended for him to avoid any surgery due to his significant heart disease and a fib on Coumadin. Dr. AHavery Morospreviously recommended a CCK HIDA, the patient declined as he is claustrophobic. He is not a candidate for MRI/MRCP as he has an AICD. His LFTs were monitored. Labs 06/16/2020 showed Alk phos level of 129, T. Bili 2, AST 16. ALT 12.   He continues to having intermittent chronic RUQ pain which occurs every 2 to 3 days and lasts for a few hours. No nausea or vomiting. His appetite is good and his weight is stable. He requests samples of FDGARD which reduces his RUQ discomfort. His bowel movements are normal. No rectal bleeding or black stools. No abdominal pain at this time.    Hepatic Function Latest Ref Rng & Units 06/16/2020 06/09/2020 03/02/2020  Total Protein 6.0 - 8.3 g/dL 6.9  7.2 7.0  Albumin 3.5 - 5.2 g/dL 4.0 4.0 3.9  AST 0 - 37 U/L 16 17 27   ALT 0 - 53 U/L 12 14 21   Alk Phosphatase 39 - 117 U/L 129(H) 140(H) 120  Total Bilirubin 0.2 - 1.2 mg/dL 2.0(H) 1.8(H) 1.9(H)  Bilirubin, Direct 0.0 - 0.3 mg/dL 0.9(H) 0.8(H) -     RUQ sonogram 06/01/2020: ULTRASOUND ABDOMEN LIMITED RIGHT UPPER QUADRANT   COMPARISON:  Abdominal CT 03/05/2018, abdominal ultrasound 06/09/2015   FINDINGS: Gallbladder: Physiologically distended. Gallstones are noted. Probable stone in the cystic duct. Possible 3 mm gallbladder polyp versus adherent stone. Mild diffuse gallbladder wall thickening measuring from 4-6 mm. No sonographic Murphy sign noted by sonographer.   Common bile duct: Diameter: 6 mm, normal.   Liver: Previous echogenic lesion in the right lobe is not seen on the current exam. Within normal limits in parenchymal echogenicity. Portal vein is patent on color Doppler imaging with normal direction of blood flow towards the liver.   Other: Small amount of right upper quadrant ascites. There is also trace fluid adjacent to the gallbladder.   IMPRESSION: 1. Gallstones, not seen on prior exam, including a stone in the cystic duct. There is diffuse gallbladder wall thickening. Gallbladder wall thickening is also seen on prior exam. There is a small amount of ascites in the right upper quadrant, including adjacent to the gallbladder. Imaging findings are indeterminate for acute gallbladder inflammation versus systemic process such  as right heart failure. 2. No common bile duct dilatation. Negative sonographic Murphy sign. 3. Consider nuclear medicine HIDA scan for further evaluation if there is clinical concern for acute cholecystitis.    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 - One 4 mm tubular adenomatous polyp at the hepatic flexure, removed with a cold snare. Resected and retrieved. - Diverticulosis in the entire  examined colon. - Internal hemorrhoids. - The examination was otherwise normal. No overt inflammation - Biopsies were taken with a cold forceps from the right colon, left colon and transverse colon for evaluation of microscopic colitis.   Current Outpatient Medications on File Prior to Visit  Medication Sig Dispense Refill   acetaminophen (TYLENOL) 650 MG CR tablet Take 650 mg by mouth 2 (two) times daily.      allopurinol (ZYLOPRIM) 300 MG tablet Take 300 mg by mouth daily.     benzonatate (TESSALON) 100 MG capsule      bisoprolol (ZEBETA) 5 MG tablet TAKE 1/2 TABLET EVERY DAY 45 tablet 3   cetirizine (ZYRTEC) 10 MG tablet Take 10 mg by mouth daily.     Cholecalciferol (VITAMIN D) 2000 units CAPS Take 2,000-4,000 capsules by mouth See admin instructions. 4000 units MWF, 2000 units all other days     dapagliflozin propanediol (FARXIGA) 10 MG TABS tablet Take 1 tablet (10 mg total) by mouth daily before breakfast. 90 tablet 3   dicyclomine (BENTYL) 10 MG capsule TAKE 1 CAPSULE EVERY 8 HOURS AS NEEDED FOR SPASMS 180 capsule 1   isosorbide mononitrate (IMDUR) 30 MG 24 hr tablet Take 0.5 tablets (15 mg total) by mouth daily. 45 tablet 0   levalbuterol (XOPENEX HFA) 45 MCG/ACT inhaler      LORazepam (ATIVAN) 1 MG tablet Take 0.25-0.5 mg by mouth at bedtime.     losartan (COZAAR) 25 MG tablet TAKE 1 TABLET TWICE DAILY 180 tablet 3   magnesium oxide (MAG-OX) 400 MG tablet Take 400 mg by mouth daily at 12 noon.      metolazone (ZAROXOLYN) 2.5 MG tablet Take 1 tablet by mouth x 1 dose 3 tablet 0   mometasone (ASMANEX) 220 MCG/INH inhaler Inhale 1 puff into the lungs daily.     montelukast (SINGULAIR) 10 MG tablet Take 1 tablet (10 mg total) by mouth at bedtime. PLEASE SCHEDULE APPOINTMENT. 30 tablet 0   pantoprazole (PROTONIX) 20 MG tablet Take 1 tablet (20 mg total) by mouth 2 (two) times daily. 180 tablet 1   Polyethyl Glycol-Propyl Glycol (SYSTANE OP) Apply 2-3 drops to eye 3 (three) times daily  as needed (dry eyes).     Psyllium (METAMUCIL) 48.57 % POWD Take 1 Dose by mouth as needed.     Psyllium (METAMUCIL) WAFR      spironolactone (ALDACTONE) 25 MG tablet TAKE 1 TABLET EVERY DAY 90 tablet 3   tamsulosin (FLOMAX) 0.4 MG CAPS capsule Take 0.4 mg by mouth daily.     torsemide (DEMADEX) 20 MG tablet Take 1 tablet (20 mg total) by mouth as directed. Take medication Monday Wednesday and Friday and Saturday 45 tablet 3   Vitamins-Lipotropics (LIPO FLAVONOID PLUS) TABS Take 3 tablets by mouth daily in the afternoon.     warfarin (COUMADIN) 5 MG tablet Take 1/2 a tablet to 1 tablet by mouth daily as directed by the coumadin clinic 90 tablet 0   potassium chloride (KLOR-CON) 10 MEQ tablet TAKE 1/2 TABLET ON MONDAY, WEDNESDAY AND FRIDAY . CAN TAKE EXTRA AS NEEDED 45  tablet 3   No current facility-administered medications on file prior to visit.   Allergies  Allergen Reactions   Other Other (See Comments)   Amoxicillin Rash and Other (See Comments)    Has patient had a PCN reaction causing immediate rash, facial/tongue/throat swelling, SOB or lightheadedness with hypotension: Yes Has patient had a PCN reaction causing severe rash involving mucus membranes or skin necrosis: No Has patient had a PCN reaction that required hospitalization: No Has patient had a PCN reaction occurring within the last 10 years: No If all of the above answers are "NO", then may proceed with Cephalosporin use.    Penicillins Rash    Has patient had a PCN reaction causing immediate rash, facial/tongue/throat swelling, SOB or lightheadedness with hypotension: Yes Has patient had a PCN reaction causing severe rash involving mucus membranes or skin necrosis: No Has patient had a PCN reaction that required hospitalization: No Has patient had a PCN reaction occurring within the last 10 years: No If all of the above answers are "NO", then may proceed with Cephalosporin use.     Current Medications, Allergies, Past  Medical History, Past Surgical History, Family History and Social History were reviewed in [employer] record.   Review of Systems:   Constitutional: Negative for fever, sweats, chills or weight loss.  Respiratory: Negative for shortness of breath.   Cardiovascular: Negative for chest pain, palpitations and leg swelling.  Gastrointestinal: See HPI.  Musculoskeletal: Negative for back pain or muscle aches.  Neurological: Negative for dizziness, headaches or paresthesias.    Physical Exam: BP (!) 84/50 (BP Location: Left Arm, Patient Position: Sitting, Cuff Size: Normal)   Pulse 68 Comment: irregular  Ht 5' 5"  (1.651 m) Comment: height measured without shoes  Wt 131 lb 8 oz (59.6 kg)   BMI 21.88 kg/m   Wt Readings from Last 3 Encounters:  07/07/20 131 lb 8 oz (59.6 kg)  06/23/20 131 lb 12.8 oz (59.8 kg)  06/01/20 127 lb 12.8 oz (58 kg)    General: Thin 80 year old male in NAD.  Head: Normocephalic and atraumatic. Eyes: No scleral icterus. Conjunctiva pink . Ears: Normal auditory acuity. Mouth: Dentition intact. No ulcers or lesions.  Lungs: Clear throughout to auscultation. Heart: Regular rhythm, 2/6 systolic murmur.  Abdomen: Soft, nontender and nondistended. No masses or hepatomegaly. Normal bowel sounds x 4 quadrants.  Rectal: Deferred.  Musculoskeletal: Symmetrical with no gross deformities. Extremities: No edema. Neurological: Alert oriented x 4. No focal deficits.  Psychological: Alert and cooperative. Normal mood and affect  Assessment and Recommendations:  80 year old male with chronic intermittent RUQ pain. RUQ abdominal sonogram results 06/01/2020 showed gallstones, possible gallstone in the cystic duct with gallbladder wall thickening. Patient declined CCK HIDA. He is not a candidate for abd MRI/MRCP due to having an AICD.  Labs 5/19 showed Alk phos 129 ( Alk phos 140 on 06/09/2020). T. Bili 2.0 ( T. Bili 1.8 on 5/12). AST 16. ALT 12.  -Request  copy of general surgery consult by Dr. Ninfa Linden  -FDGARD 2 caps po bid, samples provided -Repeat CBC and hepatic panel if RUQ pain worsens -Patient to go to the ED if he develops severe abdominal pain, fever, N/V, chest or upper back pain -Continue Pantoprazole 18m po bid  CAD, MI 1992, CHF with LVEF 20%, ischemic cardiomyopathy, AICD, LBBB, s/p CABG 1998   Atrial fibrillation on Coumadin.

## 2020-07-07 NOTE — Patient Instructions (Addendum)
If you are age 81 or older, your body mass index should be between 23-30. Your Body mass index is 21.88 kg/m. If this is out of the aforementioned range listed, please consider follow up with your Primary Care Provider.  The [facility] providers would like to encourage you to use [facility] to communicate with providers for non-urgent requests or questions.  Due to long hold times on the telephone, sending your provider a message by [facility] may be faster and more efficient way to get a response. Please allow 48 business hours for a response.  Please remember that this is for non-urgent requests/questions.  We have given you samples of the following medication to take:  FDgard, take 2 capsules twice a day for abdominal pain.  Please go to the emergency room if you develop severe right upper abdominal pain.  Please call our office if your symptoms worsen. It was great seeing you today! Thank you for entrusting me with your care and choosing [facility].  Noralyn Pick, CRNP

## 2020-07-08 DIAGNOSIS — K219 Gastro-esophageal reflux disease without esophagitis: Secondary | ICD-10-CM | POA: Diagnosis not present

## 2020-07-08 DIAGNOSIS — E782 Mixed hyperlipidemia: Secondary | ICD-10-CM | POA: Diagnosis not present

## 2020-07-08 DIAGNOSIS — I5023 Acute on chronic systolic (congestive) heart failure: Secondary | ICD-10-CM | POA: Diagnosis not present

## 2020-07-08 DIAGNOSIS — J449 Chronic obstructive pulmonary disease, unspecified: Secondary | ICD-10-CM | POA: Diagnosis not present

## 2020-07-08 DIAGNOSIS — I10 Essential (primary) hypertension: Secondary | ICD-10-CM | POA: Diagnosis not present

## 2020-07-08 DIAGNOSIS — E7800 Pure hypercholesterolemia, unspecified: Secondary | ICD-10-CM | POA: Diagnosis not present

## 2020-07-08 DIAGNOSIS — J45909 Unspecified asthma, uncomplicated: Secondary | ICD-10-CM | POA: Diagnosis not present

## 2020-07-08 DIAGNOSIS — I2510 Atherosclerotic heart disease of native coronary artery without angina pectoris: Secondary | ICD-10-CM | POA: Diagnosis not present

## 2020-07-08 DIAGNOSIS — I480 Paroxysmal atrial fibrillation: Secondary | ICD-10-CM | POA: Diagnosis not present

## 2020-07-10 NOTE — Progress Notes (Signed)
Agree with assessment and plan as outlined.  Will get documentation from general surgery. Sounds like this could be biliary colic. HIDA may be useful to clarify findings and see how aggressively he should pursue surgery should his symptoms persist or worsen, understanding he is higher than average risk for surgery. If he has severe pain I would repeat LFTs and check lipase.

## 2020-07-11 ENCOUNTER — Ambulatory Visit (INDEPENDENT_AMBULATORY_CARE_PROVIDER_SITE_OTHER): Payer: Medicare HMO

## 2020-07-11 DIAGNOSIS — Z95810 Presence of automatic (implantable) cardiac defibrillator: Secondary | ICD-10-CM | POA: Diagnosis not present

## 2020-07-11 DIAGNOSIS — I5022 Chronic systolic (congestive) heart failure: Secondary | ICD-10-CM

## 2020-07-12 ENCOUNTER — Telehealth: Payer: Self-pay

## 2020-07-12 DIAGNOSIS — N401 Enlarged prostate with lower urinary tract symptoms: Secondary | ICD-10-CM | POA: Diagnosis not present

## 2020-07-12 DIAGNOSIS — N402 Nodular prostate without lower urinary tract symptoms: Secondary | ICD-10-CM | POA: Diagnosis not present

## 2020-07-12 DIAGNOSIS — R972 Elevated prostate specific antigen [PSA]: Secondary | ICD-10-CM | POA: Diagnosis not present

## 2020-07-12 DIAGNOSIS — R3914 Feeling of incomplete bladder emptying: Secondary | ICD-10-CM | POA: Diagnosis not present

## 2020-07-12 NOTE — Telephone Encounter (Signed)
Spoke with medical records at [facility].  Requested the most recent note on the patient be faxed to [phone].

## 2020-07-13 NOTE — Progress Notes (Signed)
EPIC Encounter for ICM Monitoring  Patient Name: [name] is a 80 y.o. male Date: 07/13/2020 Primary Care Physican: [name], MD Primary Cardiologist: Kelly/Bensimhon Electrophysiologist: [name] BiV Pacing: 96.4%  07/13/2020 Weight: 126 - 130 lbs   Time in AT/AF  <0.1 hr/day (<0.1%) (on Coumadin)                                                  Spoke with patient and reports feeling well at this time.  Denies fluid symptoms.     Optivol thoracic impedance suggesting normal fluid levels.     Prescribed:   Torsemide 20 mg 1 tablet tree times a week Mon, Wed & Friday.  Can take extra as needed Potassium 10 mEq take 0.5 tablet three times a week Mon, Wed & Friday.  Can take extra as needed Spironolactone 25 mg take 1 table daily   Labs: 06/21/2020 Creatinine 0.86, BUN 17, Potassium 3.6, Sodium 134, GFr >6 0A complete set of results can be found in Results Review   Recommendations: No changes and encouraged to call if experiencing any fluid symptoms.   Follow-up plan: ICM clinic phone appointment on 08/15/2020.   91 day device clinic remote transmission 07/21/2020.     EP/Cardiology Office Visits:  Recall  06/23/2021 with Dr [name].   9/8/022 with Dr. Vaughan Browner.     Copy of ICM check sent to Dr. [name].   3 month ICM trend: 07/11/2020.    1 Year ICM trend:       [name], RN 07/13/2020 1:24 PM

## 2020-07-14 ENCOUNTER — Telehealth: Payer: Self-pay

## 2020-07-14 NOTE — Telephone Encounter (Signed)
Request sent, awaiting fax.

## 2020-07-14 NOTE — Telephone Encounter (Signed)
-----   Message from Noralyn Pick, NP sent at 07/08/2020 10:03 AM EDT ----- Lenna Sciara, patient stated he saw general surgeon Dr. Ninfa Linden in office less than one month ago. Consult note not yet in Media. Jan 2022 general surgery consult note is in media but I need the May 2022 consult note. Pls call their office to fax to [name]. Thx

## 2020-07-20 ENCOUNTER — Other Ambulatory Visit: Payer: Self-pay

## 2020-07-20 ENCOUNTER — Ambulatory Visit (INDEPENDENT_AMBULATORY_CARE_PROVIDER_SITE_OTHER): Payer: Medicare HMO

## 2020-07-20 DIAGNOSIS — Z7901 Long term (current) use of anticoagulants: Secondary | ICD-10-CM

## 2020-07-20 DIAGNOSIS — Z5181 Encounter for therapeutic drug level monitoring: Secondary | ICD-10-CM | POA: Diagnosis not present

## 2020-07-20 DIAGNOSIS — I4891 Unspecified atrial fibrillation: Secondary | ICD-10-CM | POA: Diagnosis not present

## 2020-07-20 DIAGNOSIS — I480 Paroxysmal atrial fibrillation: Secondary | ICD-10-CM

## 2020-07-20 LAB — POCT INR: INR: 2.4 (ref 2.0–3.0)

## 2020-07-20 NOTE — Patient Instructions (Signed)
Continue taking 1 tablet daily except 1/2 tablet each Monday, Wednesday and Friday.  Repeat INR in 6 weeks

## 2020-07-21 ENCOUNTER — Ambulatory Visit (INDEPENDENT_AMBULATORY_CARE_PROVIDER_SITE_OTHER): Payer: Medicare HMO

## 2020-07-21 DIAGNOSIS — I255 Ischemic cardiomyopathy: Secondary | ICD-10-CM

## 2020-07-21 LAB — CUP PACEART REMOTE DEVICE CHECK
Battery Remaining Longevity: 32 mo
Battery Voltage: 2.95 V
Brady Statistic AP VP Percent: 92.96 %
Brady Statistic AP VS Percent: 1.64 %
Brady Statistic AS VP Percent: 5.22 %
Brady Statistic AS VS Percent: 0.18 %
Brady Statistic RA Percent Paced: 92.36 %
Brady Statistic RV Percent Paced: 0.49 %
Date Time Interrogation Session: 20220623001606
HighPow Impedance: 40 Ohm
HighPow Impedance: 73 Ohm
Implantable Lead Implant Date: 20010806
Implantable Lead Implant Date: 20180813
Implantable Lead Implant Date: 20180813
Implantable Lead Location: 753858
Implantable Lead Location: 753859
Implantable Lead Location: 753860
Implantable Lead Model: 147
Implantable Lead Model: 5092
Implantable Lead Serial Number: 104581
Implantable Pulse Generator Implant Date: 20180813
Lead Channel Impedance Value: 165.029
Lead Channel Impedance Value: 172.541
Lead Channel Impedance Value: 172.541
Lead Channel Impedance Value: 189.525
Lead Channel Impedance Value: 199.5 Ohm
Lead Channel Impedance Value: 304 Ohm
Lead Channel Impedance Value: 361 Ohm
Lead Channel Impedance Value: 399 Ohm
Lead Channel Impedance Value: 399 Ohm
Lead Channel Impedance Value: 418 Ohm
Lead Channel Impedance Value: 551 Ohm
Lead Channel Impedance Value: 589 Ohm
Lead Channel Impedance Value: 589 Ohm
Lead Channel Impedance Value: 608 Ohm
Lead Channel Impedance Value: 646 Ohm
Lead Channel Impedance Value: 703 Ohm
Lead Channel Impedance Value: 703 Ohm
Lead Channel Impedance Value: 703 Ohm
Lead Channel Pacing Threshold Amplitude: 0.625 V
Lead Channel Pacing Threshold Amplitude: 1.625 V
Lead Channel Pacing Threshold Amplitude: 1.625 V
Lead Channel Pacing Threshold Pulse Width: 0.4 ms
Lead Channel Pacing Threshold Pulse Width: 0.4 ms
Lead Channel Pacing Threshold Pulse Width: 0.4 ms
Lead Channel Sensing Intrinsic Amplitude: 2.125 mV
Lead Channel Sensing Intrinsic Amplitude: 2.125 mV
Lead Channel Sensing Intrinsic Amplitude: 9.5 mV
Lead Channel Sensing Intrinsic Amplitude: 9.5 mV
Lead Channel Setting Pacing Amplitude: 1.75 V
Lead Channel Setting Pacing Amplitude: 2 V
Lead Channel Setting Pacing Amplitude: 3.5 V
Lead Channel Setting Pacing Pulse Width: 0.4 ms
Lead Channel Setting Pacing Pulse Width: 0.4 ms
Lead Channel Setting Sensing Sensitivity: 0.3 mV

## 2020-07-27 DIAGNOSIS — N390 Urinary tract infection, site not specified: Secondary | ICD-10-CM | POA: Diagnosis not present

## 2020-07-27 DIAGNOSIS — I10 Essential (primary) hypertension: Secondary | ICD-10-CM | POA: Diagnosis not present

## 2020-07-27 DIAGNOSIS — E559 Vitamin D deficiency, unspecified: Secondary | ICD-10-CM | POA: Diagnosis not present

## 2020-07-27 DIAGNOSIS — I5022 Chronic systolic (congestive) heart failure: Secondary | ICD-10-CM | POA: Diagnosis not present

## 2020-07-27 DIAGNOSIS — E7800 Pure hypercholesterolemia, unspecified: Secondary | ICD-10-CM | POA: Diagnosis not present

## 2020-07-27 DIAGNOSIS — I480 Paroxysmal atrial fibrillation: Secondary | ICD-10-CM | POA: Diagnosis not present

## 2020-07-27 DIAGNOSIS — I2510 Atherosclerotic heart disease of native coronary artery without angina pectoris: Secondary | ICD-10-CM | POA: Diagnosis not present

## 2020-07-27 DIAGNOSIS — Z95810 Presence of automatic (implantable) cardiac defibrillator: Secondary | ICD-10-CM | POA: Diagnosis not present

## 2020-07-29 ENCOUNTER — Telehealth: Payer: Self-pay | Admitting: Internal Medicine

## 2020-07-29 NOTE — Telephone Encounter (Signed)
Patient called and wanted to speak to Raquel or Erasmo Downer in the Coumadin Clinic.

## 2020-07-29 NOTE — Telephone Encounter (Signed)
Patient called to inform us of Doxycycline start and was rescheduled for INR check 7/6.  Verbalized understanding

## 2020-08-03 ENCOUNTER — Ambulatory Visit (INDEPENDENT_AMBULATORY_CARE_PROVIDER_SITE_OTHER): Payer: Medicare HMO

## 2020-08-03 ENCOUNTER — Other Ambulatory Visit: Payer: Self-pay

## 2020-08-03 DIAGNOSIS — Z7901 Long term (current) use of anticoagulants: Secondary | ICD-10-CM | POA: Diagnosis not present

## 2020-08-03 DIAGNOSIS — I480 Paroxysmal atrial fibrillation: Secondary | ICD-10-CM

## 2020-08-03 DIAGNOSIS — Z5181 Encounter for therapeutic drug level monitoring: Secondary | ICD-10-CM | POA: Diagnosis not present

## 2020-08-03 DIAGNOSIS — I4891 Unspecified atrial fibrillation: Secondary | ICD-10-CM

## 2020-08-03 LAB — POCT INR: INR: 2.9 (ref 2.0–3.0)

## 2020-08-03 NOTE — Patient Instructions (Signed)
Continue taking 1 tablet daily except 1/2 tablet each Monday, Wednesday and Friday.  Repeat INR in 4 weeks

## 2020-08-08 ENCOUNTER — Telehealth: Payer: Self-pay

## 2020-08-08 NOTE — Telephone Encounter (Signed)
Call to patient as requested by voice mail message.  He asked why the monitor has lights that come on at times.  Provided Medtronic tech support number and advised him to call to ask any questions regarding the monitor.

## 2020-08-09 NOTE — Progress Notes (Signed)
Remote ICD transmission.   

## 2020-08-11 DIAGNOSIS — M109 Gout, unspecified: Secondary | ICD-10-CM | POA: Diagnosis not present

## 2020-08-11 DIAGNOSIS — M25561 Pain in right knee: Secondary | ICD-10-CM | POA: Diagnosis not present

## 2020-08-11 DIAGNOSIS — M1990 Unspecified osteoarthritis, unspecified site: Secondary | ICD-10-CM | POA: Diagnosis not present

## 2020-08-11 DIAGNOSIS — Z79899 Other long term (current) drug therapy: Secondary | ICD-10-CM | POA: Diagnosis not present

## 2020-08-11 DIAGNOSIS — M1120 Other chondrocalcinosis, unspecified site: Secondary | ICD-10-CM | POA: Diagnosis not present

## 2020-08-11 DIAGNOSIS — I509 Heart failure, unspecified: Secondary | ICD-10-CM | POA: Diagnosis not present

## 2020-08-12 ENCOUNTER — Other Ambulatory Visit: Payer: Self-pay | Admitting: Gastroenterology

## 2020-08-15 ENCOUNTER — Ambulatory Visit (INDEPENDENT_AMBULATORY_CARE_PROVIDER_SITE_OTHER): Payer: Medicare HMO

## 2020-08-15 DIAGNOSIS — Z95810 Presence of automatic (implantable) cardiac defibrillator: Secondary | ICD-10-CM

## 2020-08-15 DIAGNOSIS — I5022 Chronic systolic (congestive) heart failure: Secondary | ICD-10-CM

## 2020-08-16 NOTE — Progress Notes (Signed)
EPIC Encounter for ICM Monitoring  Patient Name: POPE BRUNTY is a 80 y.o. male Date: 08/16/2020 Primary Care Physican: [name], MD Primary Cardiologist: Kelly/Bensimhon Electrophysiologist: [name] BiV Pacing: 96.8%  07/13/2020 Weight: 126 - 130 lbs   Time in AT/AF  0.0 hr/day (0.0%) (on Coumadin)                                                  Spoke with patient and reports feeling well at this time.  Denies fluid symptoms.     Optivol thoracic impedance suggesting normal fluid levels.     Prescribed:   Torsemide 20 mg 1 tablet tree times a week Mon, Wed & Friday.  Can take extra as needed Potassium 10 mEq take 0.5 tablet three times a week Mon, Wed & Friday.  Can take extra as needed Spironolactone 25 mg take 1 table daily   Labs: 06/21/2020 Creatinine 0.86, BUN 17, Potassium 3.6, Sodium 134, GFr >6 0A complete set of results can be found in Results Review   Recommendations: No changes and encouraged to call if experiencing any fluid symptoms.   Follow-up plan: ICM clinic phone appointment on 09/19/2020.   91 day device clinic remote transmission 10/20/2020.     EP/Cardiology Office Visits:  Recall  06/23/2021 with Dr [name].   9/8/022 with Dr. Vaughan Browner.     Copy of ICM check sent to Dr. [name].    3 month ICM trend: 08/15/2020.    1 Year ICM trend:       [name], RN 08/16/2020 4:20 PM

## 2020-08-18 DIAGNOSIS — H353120 Nonexudative age-related macular degeneration, left eye, stage unspecified: Secondary | ICD-10-CM | POA: Diagnosis not present

## 2020-08-18 DIAGNOSIS — H353211 Exudative age-related macular degeneration, right eye, with active choroidal neovascularization: Secondary | ICD-10-CM | POA: Diagnosis not present

## 2020-08-18 DIAGNOSIS — Z961 Presence of intraocular lens: Secondary | ICD-10-CM | POA: Diagnosis not present

## 2020-08-18 DIAGNOSIS — H35363 Drusen (degenerative) of macula, bilateral: Secondary | ICD-10-CM | POA: Diagnosis not present

## 2020-08-24 ENCOUNTER — Telehealth: Payer: Self-pay | Admitting: Nurse Practitioner

## 2020-08-24 DIAGNOSIS — R109 Unspecified abdominal pain: Secondary | ICD-10-CM

## 2020-08-24 DIAGNOSIS — R1011 Right upper quadrant pain: Secondary | ICD-10-CM

## 2020-08-24 NOTE — Telephone Encounter (Signed)
Patient called states he has abdominal pain and is seeking advise.

## 2020-08-24 NOTE — Telephone Encounter (Signed)
Patient reports interrupted sleep last night due to the off and on RUQ pain. Today it is still there. He says it may be a little bit more than the usual level of pain. He has had some nausea. His diet today has been sausages, toast , chips and he cannot remember what else. He is going to try Pepto-Bismol now to see if that will help. Any suggestions?

## 2020-08-25 DIAGNOSIS — I480 Paroxysmal atrial fibrillation: Secondary | ICD-10-CM | POA: Diagnosis not present

## 2020-08-25 DIAGNOSIS — N400 Enlarged prostate without lower urinary tract symptoms: Secondary | ICD-10-CM | POA: Diagnosis not present

## 2020-08-25 DIAGNOSIS — J45909 Unspecified asthma, uncomplicated: Secondary | ICD-10-CM | POA: Diagnosis not present

## 2020-08-25 DIAGNOSIS — I5023 Acute on chronic systolic (congestive) heart failure: Secondary | ICD-10-CM | POA: Diagnosis not present

## 2020-08-25 DIAGNOSIS — I10 Essential (primary) hypertension: Secondary | ICD-10-CM | POA: Diagnosis not present

## 2020-08-25 DIAGNOSIS — J449 Chronic obstructive pulmonary disease, unspecified: Secondary | ICD-10-CM | POA: Diagnosis not present

## 2020-08-25 DIAGNOSIS — K219 Gastro-esophageal reflux disease without esophagitis: Secondary | ICD-10-CM | POA: Diagnosis not present

## 2020-08-25 DIAGNOSIS — E782 Mixed hyperlipidemia: Secondary | ICD-10-CM | POA: Diagnosis not present

## 2020-08-25 DIAGNOSIS — I2510 Atherosclerotic heart disease of native coronary artery without angina pectoris: Secondary | ICD-10-CM | POA: Diagnosis not present

## 2020-08-26 NOTE — Telephone Encounter (Signed)
FYI  Spoke with patient, patient states still "some pain" Patient made aware of rec's. Again patient reluctant to have HIDA scan. Patient made aware of lab work ordered and stated he will come next week to have blood drawn. Patient advised to contact office when decision is made to proceed with scan. Patient verbalized understanding and nothing further needed at this time. Labs entered into Epic.

## 2020-08-30 DIAGNOSIS — J3089 Other allergic rhinitis: Secondary | ICD-10-CM | POA: Diagnosis not present

## 2020-08-30 DIAGNOSIS — J301 Allergic rhinitis due to pollen: Secondary | ICD-10-CM | POA: Diagnosis not present

## 2020-08-30 DIAGNOSIS — J3081 Allergic rhinitis due to animal (cat) (dog) hair and dander: Secondary | ICD-10-CM | POA: Diagnosis not present

## 2020-08-30 DIAGNOSIS — H9313 Tinnitus, bilateral: Secondary | ICD-10-CM | POA: Diagnosis not present

## 2020-08-31 ENCOUNTER — Ambulatory Visit (INDEPENDENT_AMBULATORY_CARE_PROVIDER_SITE_OTHER): Payer: Medicare HMO

## 2020-08-31 ENCOUNTER — Other Ambulatory Visit (INDEPENDENT_AMBULATORY_CARE_PROVIDER_SITE_OTHER): Payer: Medicare HMO

## 2020-08-31 ENCOUNTER — Other Ambulatory Visit: Payer: Self-pay

## 2020-08-31 DIAGNOSIS — R1011 Right upper quadrant pain: Secondary | ICD-10-CM | POA: Diagnosis not present

## 2020-08-31 DIAGNOSIS — R109 Unspecified abdominal pain: Secondary | ICD-10-CM | POA: Diagnosis not present

## 2020-08-31 DIAGNOSIS — I4891 Unspecified atrial fibrillation: Secondary | ICD-10-CM

## 2020-08-31 DIAGNOSIS — I480 Paroxysmal atrial fibrillation: Secondary | ICD-10-CM

## 2020-08-31 DIAGNOSIS — Z7901 Long term (current) use of anticoagulants: Secondary | ICD-10-CM

## 2020-08-31 DIAGNOSIS — Z5181 Encounter for therapeutic drug level monitoring: Secondary | ICD-10-CM

## 2020-08-31 LAB — HEPATIC FUNCTION PANEL
ALT: 15 U/L (ref 0–53)
AST: 18 U/L (ref 0–37)
Albumin: 4 g/dL (ref 3.5–5.2)
Alkaline Phosphatase: 128 U/L — ABNORMAL HIGH (ref 39–117)
Bilirubin, Direct: 0.7 mg/dL — ABNORMAL HIGH (ref 0.0–0.3)
Total Bilirubin: 1.7 mg/dL — ABNORMAL HIGH (ref 0.2–1.2)
Total Protein: 7.1 g/dL (ref 6.0–8.3)

## 2020-08-31 LAB — LIPASE: Lipase: 28 U/L (ref 11.0–59.0)

## 2020-08-31 LAB — POCT INR: INR: 2.1 (ref 2.0–3.0)

## 2020-08-31 NOTE — Patient Instructions (Signed)
Continue taking 1 tablet daily except 1/2 tablet each Monday, Wednesday and Friday.  Repeat INR in 4 weeks

## 2020-08-31 NOTE — Telephone Encounter (Signed)
Records received, reviewed by [facility] and sent to scan.

## 2020-09-07 ENCOUNTER — Telehealth: Payer: Self-pay | Admitting: Nurse Practitioner

## 2020-09-07 NOTE — Telephone Encounter (Signed)
Spoke with the patient. He has read the note from his provider about his lab results. He is inquiring about the HIDA scan. He wants to know what could be done if it is his gallbladder causing the symptoms and abnormal LFT's. He also wanted to know how the HIDA scan was done. We discussed the HIDA scan. He will look at the [facility] website to learn more and to see a picture of this. As to what can be done, I told him I knew surgical removal is normally done.

## 2020-09-07 NOTE — Telephone Encounter (Signed)
Patient called for lab results.

## 2020-09-12 ENCOUNTER — Other Ambulatory Visit: Payer: Self-pay | Admitting: Cardiovascular Disease

## 2020-09-19 ENCOUNTER — Ambulatory Visit (INDEPENDENT_AMBULATORY_CARE_PROVIDER_SITE_OTHER): Payer: Medicare HMO

## 2020-09-19 DIAGNOSIS — I5022 Chronic systolic (congestive) heart failure: Secondary | ICD-10-CM

## 2020-09-19 DIAGNOSIS — Z95810 Presence of automatic (implantable) cardiac defibrillator: Secondary | ICD-10-CM | POA: Diagnosis not present

## 2020-09-20 DIAGNOSIS — N400 Enlarged prostate without lower urinary tract symptoms: Secondary | ICD-10-CM | POA: Diagnosis not present

## 2020-09-20 DIAGNOSIS — K219 Gastro-esophageal reflux disease without esophagitis: Secondary | ICD-10-CM | POA: Diagnosis not present

## 2020-09-20 DIAGNOSIS — J45909 Unspecified asthma, uncomplicated: Secondary | ICD-10-CM | POA: Diagnosis not present

## 2020-09-20 DIAGNOSIS — I5023 Acute on chronic systolic (congestive) heart failure: Secondary | ICD-10-CM | POA: Diagnosis not present

## 2020-09-20 DIAGNOSIS — I480 Paroxysmal atrial fibrillation: Secondary | ICD-10-CM | POA: Diagnosis not present

## 2020-09-20 DIAGNOSIS — I10 Essential (primary) hypertension: Secondary | ICD-10-CM | POA: Diagnosis not present

## 2020-09-20 DIAGNOSIS — I2510 Atherosclerotic heart disease of native coronary artery without angina pectoris: Secondary | ICD-10-CM | POA: Diagnosis not present

## 2020-09-20 DIAGNOSIS — E782 Mixed hyperlipidemia: Secondary | ICD-10-CM | POA: Diagnosis not present

## 2020-09-20 DIAGNOSIS — J449 Chronic obstructive pulmonary disease, unspecified: Secondary | ICD-10-CM | POA: Diagnosis not present

## 2020-09-20 NOTE — Progress Notes (Signed)
EPIC Encounter for ICM Monitoring  Patient Name: [name] is a 80 y.o. male Date: 09/20/2020 Primary Care Physican: [name], MD Primary Cardiologist: Kelly/Bensimhon Electrophysiologist: [name] BiV Pacing: 96.9%  07/13/2020 Weight: 126 - 130 lbs   Time in AT/AF  0.0 hr/day (0.0%) (on Coumadin)                                                  Spoke with patient and reports feeling well at this time.  Denies fluid symptoms.     Optivol thoracic impedance suggesting normal fluid levels.     Prescribed:   Torsemide 20 mg 1 tablet tree times a week Mon, Wed & Friday.  Can take extra as needed Potassium 10 mEq take 0.5 tablet three times a week Mon, Wed & Friday.  Can take extra as needed Spironolactone 25 mg take 1 tablet daily   Labs: 06/21/2020 Creatinine 0.86, BUN 17, Potassium 3.6, Sodium 134, GFr >6 0A complete set of results can be found in Results Review   Recommendations: No changes and encouraged to call if experiencing any fluid symptoms.   Follow-up plan: ICM clinic phone appointment on 10/31/2020.   91 day device clinic remote transmission 10/20/2020.     EP/Cardiology Office Visits:  Recall  06/23/2021 with Dr [name].   9/8/022 with Dr. Vaughan Browner.     Copy of ICM check sent to Dr. [name].     3 month ICM trend: 09/20/2020.    1 Year ICM trend:       [name], RN 09/20/2020 4:22 PM

## 2020-09-21 ENCOUNTER — Other Ambulatory Visit: Payer: Self-pay | Admitting: Gastroenterology

## 2020-09-22 DIAGNOSIS — H8110 Benign paroxysmal vertigo, unspecified ear: Secondary | ICD-10-CM | POA: Diagnosis not present

## 2020-09-22 DIAGNOSIS — I959 Hypotension, unspecified: Secondary | ICD-10-CM | POA: Diagnosis not present

## 2020-09-23 ENCOUNTER — Telehealth (HOSPITAL_COMMUNITY): Payer: Self-pay | Admitting: *Deleted

## 2020-09-23 NOTE — Telephone Encounter (Signed)
Pt called to report he has been having some dizziness off/on and BP has been running a little low, 100s/40s. He saw PCP yesterday and they advised him to decrease losartan to 12.5 mg BID. He wanted Dr [name] to be aware and see if he agrees with plan or if other adjustments are needed. Advised will send to Dr [name] for recommendations and call him back early next week.

## 2020-09-27 MED ORDER — LOSARTAN POTASSIUM 25 MG PO TABS: 12.5000 mg | ORAL_TABLET | Freq: Two times a day (BID) | ORAL | 3 refills | Status: DC

## 2020-09-27 NOTE — Telephone Encounter (Signed)
Pt aware, med list updated.

## 2020-09-28 ENCOUNTER — Ambulatory Visit (INDEPENDENT_AMBULATORY_CARE_PROVIDER_SITE_OTHER): Payer: Medicare HMO

## 2020-09-28 ENCOUNTER — Other Ambulatory Visit: Payer: Self-pay

## 2020-09-28 DIAGNOSIS — Z5181 Encounter for therapeutic drug level monitoring: Secondary | ICD-10-CM | POA: Diagnosis not present

## 2020-09-28 LAB — POCT INR: INR: 2.6 (ref 2.0–3.0)

## 2020-09-28 NOTE — Patient Instructions (Signed)
Continue taking 1 tablet daily except 1/2 tablet each Monday, Wednesday and Friday.  Repeat INR in 6 weeks

## 2020-10-06 ENCOUNTER — Ambulatory Visit (HOSPITAL_COMMUNITY)
Admission: RE | Admit: 2020-10-06 | Discharge: 2020-10-06 | Disposition: A | Discharge status: Home / Self Care | Payer: Medicare HMO | Source: Ambulatory Visit | Attending: Internal Medicine | Admitting: Internal Medicine

## 2020-10-06 ENCOUNTER — Encounter (HOSPITAL_COMMUNITY): Payer: Self-pay | Admitting: Internal Medicine

## 2020-10-06 ENCOUNTER — Other Ambulatory Visit: Payer: Self-pay

## 2020-10-06 VITALS — BP 100/58 | HR 60 | Wt 128.4 lb

## 2020-10-06 DIAGNOSIS — Z7901 Long term (current) use of anticoagulants: Secondary | ICD-10-CM | POA: Diagnosis not present

## 2020-10-06 DIAGNOSIS — I447 Left bundle-branch block, unspecified: Secondary | ICD-10-CM | POA: Insufficient documentation

## 2020-10-06 DIAGNOSIS — I480 Paroxysmal atrial fibrillation: Secondary | ICD-10-CM | POA: Insufficient documentation

## 2020-10-06 DIAGNOSIS — Z7984 Long term (current) use of oral hypoglycemic drugs: Secondary | ICD-10-CM | POA: Diagnosis not present

## 2020-10-06 DIAGNOSIS — Z951 Presence of aortocoronary bypass graft: Secondary | ICD-10-CM | POA: Insufficient documentation

## 2020-10-06 DIAGNOSIS — Z8249 Family history of ischemic heart disease and other diseases of the circulatory system: Secondary | ICD-10-CM | POA: Diagnosis not present

## 2020-10-06 DIAGNOSIS — I252 Old myocardial infarction: Secondary | ICD-10-CM | POA: Diagnosis not present

## 2020-10-06 DIAGNOSIS — I2510 Atherosclerotic heart disease of native coronary artery without angina pectoris: Secondary | ICD-10-CM | POA: Insufficient documentation

## 2020-10-06 DIAGNOSIS — I5022 Chronic systolic (congestive) heart failure: Secondary | ICD-10-CM | POA: Diagnosis not present

## 2020-10-06 DIAGNOSIS — Z95810 Presence of automatic (implantable) cardiac defibrillator: Secondary | ICD-10-CM | POA: Diagnosis not present

## 2020-10-06 DIAGNOSIS — I340 Nonrheumatic mitral (valve) insufficiency: Secondary | ICD-10-CM | POA: Diagnosis not present

## 2020-10-06 DIAGNOSIS — Z79899 Other long term (current) drug therapy: Secondary | ICD-10-CM | POA: Diagnosis not present

## 2020-10-06 LAB — COMPREHENSIVE METABOLIC PANEL
ALT: 18 U/L (ref 0–44)
AST: 19 U/L (ref 15–41)
Albumin: 3.7 g/dL (ref 3.5–5.0)
Alkaline Phosphatase: 112 U/L (ref 38–126)
Anion gap: 5 (ref 5–15)
BUN: 15 mg/dL (ref 8–23)
CO2: 29 mmol/L (ref 22–32)
Calcium: 9.8 mg/dL (ref 8.9–10.3)
Chloride: 103 mmol/L (ref 98–111)
Creatinine, Ser: 0.95 mg/dL (ref 0.61–1.24)
GFR, Estimated: >60 mL/min (ref >60)
Glucose, Bld: 98 mg/dL (ref 70–99)
Potassium: 4.3 mmol/L (ref 3.5–5.1)
Sodium: 137 mmol/L (ref 135–145)
Total Bilirubin: 1.9 mg/dL — ABNORMAL HIGH (ref 0.3–1.2)
Total Protein: 6.9 g/dL (ref 6.5–8.1)

## 2020-10-06 LAB — CBC
HCT: 45.1 % (ref 39.0–52.0)
Hemoglobin: 14.5 g/dL (ref 13.0–17.0)
MCH: 30 pg (ref 26.0–34.0)
MCHC: 32.2 g/dL (ref 30.0–36.0)
MCV: 93.4 fL (ref 80.0–100.0)
Platelets: 224 10*3/uL (ref 150–400)
RBC: 4.83 MIL/uL (ref 4.22–5.81)
RDW: 15.7 % — ABNORMAL HIGH (ref 11.5–15.5)
WBC: 7.9 10*3/uL (ref 4.0–10.5)
nRBC: 0 % (ref 0.0–0.2)

## 2020-10-06 LAB — BRAIN NATRIURETIC PEPTIDE: B Natriuretic Peptide: 1582.1 pg/mL — ABNORMAL HIGH (ref 0.0–100.0)

## 2020-10-06 NOTE — Progress Notes (Signed)
Patient ID: [name], male   DOB: 03-27-1940, 80 y.o.   MRN: [id number]    Advanced Heart Failure Clinic Note   Date:  10/06/2020   ID:  [name], [name] January 29, 1941, MRN [id number]  PCP:  [name], MD  Primary Cardiologist:  [name] Referring: Dr. [name]   History of Present Illness: [name] is a 80 y.o. 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 In September 1998 he underwent CABG  after an unsuccessful attempt at stenting of his proximal LAD by [name]. Ejection fraction was 25-30%. He is s/p ICD with upgrade to CRT-D   Myoview 11/13   in November 2013 showed a large area of scar in the LAD territory (extent 44%) involving the mid to apical anterior, apical and infero-apical to mid infero-septal and apical lateral wall without associated ischemia.  Admitted 5/16 atrial fibrillation and started on Tikosyn.  He was enrolled in the genetic AF trial (bucindolol vs Toprol). He did poorly in the trial in the setting of titration of beta-blocker. He was referred to HF Clinic  Echo 4/21 EF 20% LV markedly dilated. Severe MR  RV moderately reduced   I have reviewed echo with Dr. Copper regarding MitraClip and felt to be too advanced  Had mild COVID in April 2022.   He presents today for follow up with his wife. Feeling pretty good. Remains active with daily activities. 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 Now taking torsemide 4 days a week and fluid much better. Also taking [place] daily. No edema, orthopnea or PND.   ICD interrogated in clinic. Volume status low. No AF/VT. 100% BiV Personally reviewed   CPX 5/21  FVC 3.17 (89%)      FEV1 2.35 (88%)        FEV1/FVC 74 (98%)        MVV 71 (66%)   Resting HR: 69 Standing HR: 72 Peak HR: 105   (74% age predicted max HR)  BP rest: 104/58 Standing BP: 90/56 BP peak: 112/60  Peak VO2: 15.5 (66% predicted peak VO2)  VE/VCO2 slope:   43  OUES: 1.12  Peak RER: 1.11   Ventilatory Threshold: 13.6 (57% predicted or measured peak VO2)  VE/MVV:  71%  O2pulse:  9   (90% predicted O2pulse)   Echo 04/04/17 EF 20% with mild to mod reduction RV function.   CPX 02/17/16 Resting HR: 73 Peak HR: 108   (75% age predicted max HR) BP rest: 110/58 BP peak: 150/56 Peak VO2: 14.4 (58% predicted peak VO2) VE/VCO2 slope:  33 OUES: 1.32 Peak RER: 1.10 PETCO2 at peak:  33 O2pulse:  9   (82% predicted O2pulse)  Echo 12/17 EF 15-20%, Moderate MR, RV with reduced function Echo 5/17 EF 20% RV mildly down.  Echo 3/19: EF 20%, RV mild to moderate reduced function   Past Medical History:  Diagnosis Date   Acute on chronic systolic CHF (congestive heart failure) ([place]) 06/09/2015   Automatic implantable cardiac defibrillator in situ 2002; 2010   medtronic virtuso   Cardiomyopathy, ischemic 2011   with EF 25-35% by echo   Coronary artery disease    Hx MI 1992, CABG 1998 , Nuc study 11.2013 large scar but no ischemia   GERD (gastroesophageal reflux disease)    H/O myocardial infarction, greater than 8 weeks 1992   large ant wall injury   NSVT (nonsustained ventricular tachycardia) ([place])  Paroxysmal atrial fibrillation ([facility])     Current Outpatient Medications  Medication Sig Dispense Refill   acetaminophen (TYLENOL) 650 MG CR tablet Take 650 mg by mouth 2 (two) times daily.      allopurinol (ZYLOPRIM) 300 MG tablet Take 300 mg by mouth daily.     benzonatate (TESSALON) 100 MG capsule Take by mouth as needed for cough.     bisoprolol (ZEBETA) 5 MG tablet TAKE 1/2 TABLET EVERY DAY 45 tablet 3   cetirizine (ZYRTEC) 10 MG tablet Take 10 mg by mouth daily.     dapagliflozin propanediol (FARXIGA) 10 MG TABS tablet Take 1 tablet (10 mg total) by mouth daily before breakfast. 90 tablet 3   dicyclomine (BENTYL) 10 MG capsule TAKE 1 CAPSULE EVERY 8 HOURS AS NEEDED FOR SPASM(S) 180 capsule 1   isosorbide mononitrate (IMDUR) 30 MG 24 hr tablet  Take 0.5 tablets (15 mg total) by mouth daily. 45 tablet 0   LORazepam (ATIVAN) 1 MG tablet Take 0.25-0.5 mg by mouth at bedtime.     losartan (COZAAR) 25 MG tablet Take 0.5 tablets (12.5 mg total) by mouth 2 (two) times daily. 180 tablet 3   magnesium oxide (MAG-OX) 400 MG tablet Take 400 mg by mouth daily at 12 noon.      mometasone (ASMANEX) 220 MCG/INH inhaler Inhale 2 puffs into the lungs as needed.     montelukast (SINGULAIR) 10 MG tablet Take 1 tablet (10 mg total) by mouth at bedtime. PLEASE SCHEDULE APPOINTMENT. 30 tablet 0   Multiple Vitamins-Minerals (VITAMIN D3 COMPLETE PO) Take 1,000 Int'l Units/1.28m by mouth 2 (two) times daily.     pantoprazole (PROTONIX) 20 MG tablet TAKE 1 TABLET TWICE DAILY 180 tablet 1   Polyethyl Glycol-Propyl Glycol (SYSTANE OP) Apply 2-3 drops to eye 3 (three) times daily as needed (dry eyes).     potassium chloride (KLOR-CON) 10 MEQ tablet TAKE 1/2 TABLET ON MONDAY, WEDNESDAY AND FRIDAY . CAN TAKE EXTRA AS NEEDED 45 tablet 3   Psyllium (METAMUCIL) 48.57 % POWD Take 1 Dose by mouth as needed.     simvastatin (ZOCOR) 40 MG tablet TAKE 1 TABLET EVERY DAY (NEED MD APPOINTMENT FOR REFILLS) 90 tablet 3   spironolactone (ALDACTONE) 25 MG tablet TAKE 1 TABLET EVERY DAY 90 tablet 3   tamsulosin (FLOMAX) 0.4 MG CAPS capsule Take 0.4 mg by mouth daily.     torsemide (DEMADEX) 20 MG tablet Take 1 tablet (20 mg total) by mouth as directed. Take medication Monday Wednesday and Friday and Saturday 45 tablet 3   Vitamins-Lipotropics (LIPO FLAVONOID PLUS) TABS Take 3 tablets by mouth daily in the afternoon.     warfarin (COUMADIN) 5 MG tablet Take 1/2 a tablet to 1 tablet by mouth daily as directed by the coumadin clinic 90 tablet 0   No current facility-administered medications for this encounter.    Allergies:    Allergies  Allergen Reactions   Other Other (See Comments)   Amoxicillin Rash and Other (See Comments)    Has patient had a PCN reaction causing immediate  rash, facial/tongue/throat swelling, SOB or lightheadedness with hypotension: Yes Has patient had a PCN reaction causing severe rash involving mucus membranes or skin necrosis: No Has patient had a PCN reaction that required hospitalization: No Has patient had a PCN reaction occurring within the last 10 years: No If all of the above answers are "NO", then may proceed with Cephalosporin use.    Penicillins Rash    Has patient  had a PCN reaction causing immediate rash, facial/tongue/throat swelling, SOB or lightheadedness with hypotension: Yes Has patient had a PCN reaction causing severe rash involving mucus membranes or skin necrosis: No Has patient had a PCN reaction that required hospitalization: No Has patient had a PCN reaction occurring within the last 10 years: No If all of the above answers are "NO", then may proceed with Cephalosporin use.     Social History:  The patient  reports that he has never smoked. He has never used smokeless tobacco. He reports current alcohol use of about 2.0 standard drinks per week. He reports that he does not use drugs.   Family history:   Family History  Problem Relation Age of Onset   Heart disease Father        questionable   Stroke Mother    Heart failure Sister    Healthy Brother    Healthy Sister    Parkinsonism Brother    Colon cancer Neg Hx    Review of systems complete and found to be negative unless listed in HPI.    PHYSICAL EXAM: Vitals:   10/06/20 1132  BP: (!) 100/58  Pulse: 60  SpO2: 97%  Weight: 58.2 kg (128 lb 6.4 oz)   Wt Readings from Last 3 Encounters:  10/06/20 58.2 kg (128 lb 6.4 oz)  07/07/20 59.6 kg (131 lb 8 oz)  06/23/20 59.8 kg (131 lb 12.8 oz)   General:  Thin appearing. No resp difficulty HEENT: normal Neck: supple. JVP 9 Carotids 2+ bilat; no bruits. No lymphadenopathy or thryomegaly appreciated. Cor: PMI nondisplaced. Regular rate & rhythm. 2/6 MR Lungs: clear Abdomen: soft, nontender, nondistended.  No hepatosplenomegaly. No bruits or masses. Good bowel sounds. Extremities: no cyanosis, clubbing, rash, edema Neuro: alert & orientedx3, cranial nerves grossly intact. moves all 4 extremities w/o difficulty. Affect pleasant   ASSESSMENT AND PLAN:  1. 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 PVO2 improved since 2018 but VEVCO2 elevated suggestive of high pulmonary pressures with exercise - Echo 05/18/19 EF 20% with moderate RV dysfunction. LV markedly dilated LVIDd 7.0 cm Severe MR, mod AI, mod TR.  - Stable NYHA III  symptoms in face of severe biV dysfunction. - Still getting 3-5k per day. No change - Rash after taking Entresto.  - Continue losartan 12.5 bid (recently cut back from 25 bid) - Continue Farxiga 10 - Continue spiro 25 mg daily - Continue bisoprolol 2.5 mg daily.  - Unable to tolerate higher doses of GDMT with low BP - Fluid level looks great. Continue torsemide '20mg'$  M/W/F/Sat . We did discuss the fact that he is not candidate for advanced therapies including transplant (age), VAD (size and RV dysfunction). MitraClip (LV failure too advanced)  - Not candidate for Barostim - ICD interrogated in Clinic  2. PAF - Maintaining NSR on exam and ICD - Continue coumadin. Does not want Eliquis - No bleeding    3. CAD s/p previous anterior infarct - No s/s ischemia - Off ASA due to warfarin. Continue statin. - LDL followed by PCP  4. LBBB - Now s/p CRT. 100% biv pacing on device today - No change  5. Severe MR - not candidate for Clip   6. Weight loss - encouraged increased calorie intake   Glori Bickers, MD  12:04 PM

## 2020-10-06 NOTE — Addendum Note (Signed)
Encounter addended by: Kerry Dory, CMA on: 10/06/2020 12:24 PM  Actions taken: Charge Capture section accepted, Clinical Note Signed

## 2020-10-06 NOTE — Patient Instructions (Signed)
It was great to see you today! No medication changes are needed at this time.  Labs today We will only contact you if something comes back abnormal or we need to make some changes. Otherwise no news is good news!  Your physician recommends that you schedule a follow-up appointment in: 4 months with Dr [name]  Do the following things EVERYDAY: Weigh yourself in the morning before breakfast. Write it down and keep it in a log. Take your medicines as prescribed Eat low salt foods--Limit salt (sodium) to 2000 mg per day.  Stay as active as you can everyday Limit all fluids for the day to less than 2 liters  At the [facility], you and your health needs are our priority. As part of our continuing mission to provide you with exceptional heart care, we have created designated Provider Care Teams. These Care Teams include your primary Cardiologist (physician) and Advanced Practice Providers (APPs- Physician Assistants and Nurse Practitioners) who all work together to provide you with the care you need, when you need it.   You may see any of the following providers on your designated Care Team at your next follow up: Dr Glori Bickers Dr Loralie Champagne Dr Patrice Paradise, NP Lyda Jester, [place] Ginnie Smart Audry Riles, PharmD   Please be sure to bring in all your medications bottles to every appointment.    If you have any questions or concerns before your next appointment please send [name] a message through [place] or call our office at [phone].    TO LEAVE A MESSAGE FOR THE NURSE SELECT OPTION 2, PLEASE LEAVE A MESSAGE INCLUDING: YOUR NAME DATE OF BIRTH CALL BACK NUMBER REASON FOR CALL**this is important as we prioritize the call backs  YOU WILL RECEIVE A CALL BACK THE SAME DAY AS LONG AS YOU CALL BEFORE 4:00 PM

## 2020-10-06 NOTE — Addendum Note (Signed)
Encounter addended by: Scarlette Calico, RN on: 10/06/2020 12:22 PM  Actions taken: Order list changed, Diagnosis association updated

## 2020-10-08 ENCOUNTER — Other Ambulatory Visit (HOSPITAL_COMMUNITY): Payer: Self-pay | Admitting: Internal Medicine

## 2020-10-10 ENCOUNTER — Telehealth: Payer: Self-pay | Admitting: Nurse Practitioner

## 2020-10-10 NOTE — Telephone Encounter (Signed)
Patient called states he is having a lot of digestive issues and would like to speak with someone and get advise.

## 2020-10-11 NOTE — Telephone Encounter (Signed)
Called the patient back to discuss his digestive issue and concerns. No answer. Left a message of my call on his voicemail.

## 2020-10-13 ENCOUNTER — Other Ambulatory Visit (HOSPITAL_COMMUNITY): Payer: Self-pay | Admitting: *Deleted

## 2020-10-13 MED ORDER — TORSEMIDE 20 MG PO TABS: ORAL_TABLET | ORAL | 2 refills | Status: DC

## 2020-10-16 ENCOUNTER — Telehealth: Payer: Self-pay | Admitting: Cardiology

## 2020-10-16 DIAGNOSIS — I5023 Acute on chronic systolic (congestive) heart failure: Secondary | ICD-10-CM | POA: Diagnosis not present

## 2020-10-16 DIAGNOSIS — I10 Essential (primary) hypertension: Secondary | ICD-10-CM | POA: Diagnosis not present

## 2020-10-16 DIAGNOSIS — I480 Paroxysmal atrial fibrillation: Secondary | ICD-10-CM | POA: Diagnosis not present

## 2020-10-16 DIAGNOSIS — I5022 Chronic systolic (congestive) heart failure: Secondary | ICD-10-CM | POA: Diagnosis not present

## 2020-10-16 DIAGNOSIS — I2510 Atherosclerotic heart disease of native coronary artery without angina pectoris: Secondary | ICD-10-CM | POA: Diagnosis not present

## 2020-10-16 DIAGNOSIS — J45909 Unspecified asthma, uncomplicated: Secondary | ICD-10-CM | POA: Diagnosis not present

## 2020-10-16 DIAGNOSIS — K219 Gastro-esophageal reflux disease without esophagitis: Secondary | ICD-10-CM | POA: Diagnosis not present

## 2020-10-16 DIAGNOSIS — E782 Mixed hyperlipidemia: Secondary | ICD-10-CM | POA: Diagnosis not present

## 2020-10-16 DIAGNOSIS — J449 Chronic obstructive pulmonary disease, unspecified: Secondary | ICD-10-CM | POA: Diagnosis not present

## 2020-10-16 NOTE — Telephone Encounter (Signed)
Patient called in reporting shortness of breath and abd pain. Says the abd pain has been intermittent for awhile now. Has been back and forth with trying to get in GI but has not been seen. Also began feeling short of breath this afternoon around 2pm. Says his weight has been stable at 124-125lb at home, UOP has been good, no LE edema, orthopnea or PND. Somewhat difficult to obtain good history. Seems he "just doesn't feel well". Does not appear short of breath over the phone. Given his extensive hx, advised would probably be best if he is evaluated in the ED, suggested [facility] as he says this is close to him as it's difficult to discern what the issue may be over the phone. He request that I send a message to Dr. Clayborne Dana office as well because he would like to be seen if possible for soon appt. Is agreeable to evaluated today. Will route to CHF clinic to reach out to patient regarding setting up appt.

## 2020-10-16 NOTE — Telephone Encounter (Signed)
[name] called the service line this evening because he has been having persistent abdominal pain for about 6 hours.  This pain is similar to pain he has had in the past, but it has persisted for longer than usual, and he is also feeling a little short of breath.  He denies nausea/vomiting, fevers/chills.  No LH/dizziness.  His pulse was in the 60s and his pulse ox was in the upper 90s.  He has known gallstones but was not felt to be a surgical candidate for elective cholecystectomy.  He also has a diagnosis of IBS.  He had a bowel movement earlier today.   We discussed the typical symptoms of symptomatic cholelithiasis, cholecystitis and IBS.  His symptoms did not seem consistent with cholecystitis or any other medical emergency, as he was not in duress and had had similar symptoms in the past.   He queried whether his shortness of breath could be related to anxiety, and I said that this was definitely possible, but that if he was truly having difficulty breathing he should be evaluated in the Emergency Room.  For his abdominal pain, I suggested he try some IBGard this evening in addition to his Bentyl.  He asked if it was okay to try to eat something and I recommended trying some bland food such as rice and to avoid greasy or spicy foods.

## 2020-10-17 NOTE — Telephone Encounter (Signed)
Spoke with patient to follow up and see how he is feeling today. Pt reports that he was able to get IB [place] yesterday evening and he slept ok. Pt states that he ate an egg and toast this morning for breakfast and tolerated that well. Pt states that he still has some aching in his upper abdomen but the pain has improved since yesterday. Pt reports that the ache comes and goes. He did take an IB Gard this morning and plans to take another this afternoon. Pt denies any nausea, vomiting, or fever. He did have a BM today, he describes as a soft but formed stool. Advised patient to monitor his symptoms and continue Bentyl and IB Gard as directed. Advised that he continue to watch his diet for the next few days. We discussed some options of food like baked/boiled chicken, rice, grits. Advised patient if his symptoms worsen to give [name] a call back. Pt verbalized understanding and had no concerns at the end of the call.

## 2020-10-17 NOTE — Telephone Encounter (Signed)
Thanks [place], [place] can you check on him today and see how he is doing? Thanks

## 2020-10-17 NOTE — Telephone Encounter (Signed)
Pt spoke w/GI today, see that phone note

## 2020-10-17 NOTE — Telephone Encounter (Signed)
Okay thank you [name] 

## 2020-10-18 ENCOUNTER — Telehealth: Payer: Self-pay | Admitting: Nurse Practitioner

## 2020-10-18 NOTE — Telephone Encounter (Signed)
Inbound call from patient requesting a call from a nurse please.  States is having severe abdominal pain and is wanting to have an ultrasound.  Please advise.

## 2020-10-19 NOTE — Telephone Encounter (Signed)
Beth what day are you thinking? I'm out of the office for a few days. I don't think I have anything open the following week. Will see if we can work him in in a few weeks or he can see one of the APPs as well. I think our recommendation will likely be the same, not sure if gallstones or causing his symptoms or not. He could call nuclear medicine and maybe they can discuss how a HIDA works with him and see if he can tolerate it? Thanks

## 2020-10-19 NOTE — Telephone Encounter (Signed)
Spoke with the patient  about the recommendations. 20 minute phone call. He reports he has told cardiology about the intermittent sensations of SOB. Agrees that he will make a phone call and make him aware that this is continuing. He is aware that a HIDA scan is recommended. He has investigated this. Concerns about claustrophobia and having to lie on his back which could trigger vertigo.  Dr Havery Moros may I bring him in to see you on a 4 pm appointment?

## 2020-10-19 NOTE — Telephone Encounter (Signed)
[name] agrees to see the patient tomorrow.  Contacted the patient. He is very pleased with this and will be here for a 10:30 appointment.

## 2020-10-19 NOTE — Telephone Encounter (Signed)
Spoke with the patient. He is experiencing the same symptoms he has been having. He has pain in the RUQ of his abdomen. He has been feeling a little short of breath with the pain. He does not vomit. The pain comes and goes as it has been doing. Reports that he tries to eat very carefully avoiding his known trigger foods. He has cereal for breakfast without any problems. Presently, he is comfortable. He inquires if he should repeat imaging or any labs.

## 2020-10-20 ENCOUNTER — Ambulatory Visit (INDEPENDENT_AMBULATORY_CARE_PROVIDER_SITE_OTHER): Payer: Medicare HMO

## 2020-10-20 ENCOUNTER — Telehealth: Payer: Self-pay

## 2020-10-20 ENCOUNTER — Other Ambulatory Visit (INDEPENDENT_AMBULATORY_CARE_PROVIDER_SITE_OTHER): Payer: Medicare HMO

## 2020-10-20 ENCOUNTER — Encounter: Payer: Self-pay | Admitting: Nurse Practitioner

## 2020-10-20 ENCOUNTER — Ambulatory Visit (INDEPENDENT_AMBULATORY_CARE_PROVIDER_SITE_OTHER): Payer: Medicare HMO | Admitting: Nurse Practitioner

## 2020-10-20 VITALS — BP 110/50 | HR 64 | Ht 65.0 in | Wt 128.4 lb

## 2020-10-20 DIAGNOSIS — R634 Abnormal weight loss: Secondary | ICD-10-CM

## 2020-10-20 DIAGNOSIS — R1011 Right upper quadrant pain: Secondary | ICD-10-CM | POA: Diagnosis not present

## 2020-10-20 DIAGNOSIS — I472 Ventricular tachycardia: Secondary | ICD-10-CM | POA: Diagnosis not present

## 2020-10-20 DIAGNOSIS — I4729 Other ventricular tachycardia: Secondary | ICD-10-CM

## 2020-10-20 LAB — CUP PACEART REMOTE DEVICE CHECK
Battery Remaining Longevity: 28 mo
Battery Voltage: 2.95 V
Brady Statistic AP VP Percent: 81.7 %
Brady Statistic AP VS Percent: 1.49 %
Brady Statistic AS VP Percent: 16.39 %
Brady Statistic AS VS Percent: 0.42 %
Brady Statistic RA Percent Paced: 81.89 %
Brady Statistic RV Percent Paced: 0.19 %
Date Time Interrogation Session: 20220922012404
HighPow Impedance: 44 Ohm
HighPow Impedance: 80 Ohm
Implantable Lead Implant Date: 20010806
Implantable Lead Implant Date: 20180813
Implantable Lead Implant Date: 20180813
Implantable Lead Location: 753858
Implantable Lead Location: 753859
Implantable Lead Location: 753860
Implantable Lead Model: 147
Implantable Lead Model: 5092
Implantable Lead Serial Number: 104581
Implantable Pulse Generator Implant Date: 20180813
Lead Channel Impedance Value: 172.541
Lead Channel Impedance Value: 172.541
Lead Channel Impedance Value: 176 Ohm
Lead Channel Impedance Value: 199.5 Ohm
Lead Channel Impedance Value: 204.14 Ohm
Lead Channel Impedance Value: 304 Ohm
Lead Channel Impedance Value: 399 Ohm
Lead Channel Impedance Value: 399 Ohm
Lead Channel Impedance Value: 418 Ohm
Lead Channel Impedance Value: 456 Ohm
Lead Channel Impedance Value: 589 Ohm
Lead Channel Impedance Value: 589 Ohm
Lead Channel Impedance Value: 608 Ohm
Lead Channel Impedance Value: 646 Ohm
Lead Channel Impedance Value: 665 Ohm
Lead Channel Impedance Value: 703 Ohm
Lead Channel Impedance Value: 703 Ohm
Lead Channel Impedance Value: 722 Ohm
Lead Channel Pacing Threshold Amplitude: 0.625 V
Lead Channel Pacing Threshold Amplitude: 1.625 V
Lead Channel Pacing Threshold Amplitude: 1.75 V
Lead Channel Pacing Threshold Pulse Width: 0.4 ms
Lead Channel Pacing Threshold Pulse Width: 0.4 ms
Lead Channel Pacing Threshold Pulse Width: 0.4 ms
Lead Channel Sensing Intrinsic Amplitude: 15 mV
Lead Channel Sensing Intrinsic Amplitude: 15 mV
Lead Channel Sensing Intrinsic Amplitude: 2 mV
Lead Channel Sensing Intrinsic Amplitude: 2 mV
Lead Channel Setting Pacing Amplitude: 1.75 V
Lead Channel Setting Pacing Amplitude: 2 V
Lead Channel Setting Pacing Amplitude: 3.5 V
Lead Channel Setting Pacing Pulse Width: 0.4 ms
Lead Channel Setting Pacing Pulse Width: 0.4 ms
Lead Channel Setting Sensing Sensitivity: 0.3 mV

## 2020-10-20 LAB — BASIC METABOLIC PANEL
BUN: 21 mg/dL (ref 6–23)
CO2: 28 mEq/L (ref 19–32)
Calcium: 9.5 mg/dL (ref 8.4–10.5)
Chloride: 100 mEq/L (ref 96–112)
Creatinine, Ser: 1.02 mg/dL (ref 0.40–1.50)
GFR: 69.16 mL/min (ref >60.00)
Glucose, Bld: 130 mg/dL — ABNORMAL HIGH (ref 70–99)
Potassium: 3.4 mEq/L — ABNORMAL LOW (ref 3.5–5.1)
Sodium: 135 mEq/L (ref 135–145)

## 2020-10-20 NOTE — Progress Notes (Signed)
10/20/2020 [name] [id number] 10/01/1940   Chief Complaint: RUQ pain  History of Present Illness:  [name]. [name] is an 80 year old male with a past medical history of CAD, MI 67, CHF with LVEF 20%, ischemic cardiomyopathy, severe MR, AICD, LBBB, s/p CABG 1998 and atrial fibrillation on Coumadin. Mild Covid 19 04/2020.  He has IBS and chronic RUQ pain followed by [name].  He presents to our office today per my request for further evaluation regarding recurrent RUQ pain which has recently increased over the past 1 to 2 weeks.  When he has active RUQ pain, he is also experiencing mild shortness of breath.  No cough or hemoptysis.  No chest pain, dizziness or palpitations.  No edema.  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  RUQ sonogram  06/01/2020 which showed a distended gallbladder with gallstones and a probable stone in the cystic duct vs a possible 65m gallbladder polyps verses an adherent stone. Mild diffuse gallbladder wall thickening was noted.  A CCK HIDA scan was recommended, however, the patient declined to complete this study.  He was evaluated by general surgery and the patient did not wish to pursue a cholecystectomy primarily because he reported his cardiologist thought he was too high risk for any surgery secondary to his ischemic cardiomyopathy with LVEF 20%.  He called our answering service on 10/10/2020 with complaints of persistent RUQ pain for 6 hours with associated mild shortness of breath.  He was advised to take IBgard which reduced his symptoms in the past and Bentyl, if no improvement to proceed to the ED for further evaluation.  His RUQ pain decreased and he was in no acute distress therefore he did not present to the ED.  He denies having any further RUQ pain or SOB for the past 2 days.  No nausea or vomiting.  He is passing normal formed dark to normal brown stool most days.  No black stools or rectal  bleeding.  He is eating 3 meals daily as confirmed by his wife who is present.  He has lost 3 pounds over the past 3 months.  His weight today is 128 pounds.  Labs 10/06/2020: Sodium 137.  Potassium 4.3.  BUN 15.  Creatinine 0.95.  Alk phos 112.  Albumin 3.7.  AST 19.  ALT 18.  Total bili 1.9.  BNP 1582.  Current Outpatient Medications on File Prior to Visit  Medication Sig Dispense Refill   acetaminophen (TYLENOL) 650 MG CR tablet Take 650 mg by mouth 2 (two) times daily.      allopurinol (ZYLOPRIM) 300 MG tablet Take 300 mg by mouth daily.     benzonatate (TESSALON) 100 MG capsule Take by mouth as needed for cough.     bisoprolol (ZEBETA) 5 MG tablet TAKE 1/2 TABLET EVERY DAY 45 tablet 3   cetirizine (ZYRTEC) 10 MG tablet Take 10 mg by mouth daily.     dapagliflozin propanediol (FARXIGA) 10 MG TABS tablet Take 1 tablet (10 mg total) by mouth daily before breakfast. 90 tablet 3   dicyclomine (BENTYL) 10 MG capsule TAKE 1 CAPSULE EVERY 8 HOURS AS NEEDED FOR SPASM(S) 180 capsule 1   isosorbide mononitrate (IMDUR) 30 MG 24 hr tablet Take 0.5 tablets (15 mg total) by mouth daily. 45 tablet 0   LORazepam (ATIVAN) 1 MG tablet Take 0.25-0.5 mg by mouth at bedtime.     losartan (COZAAR) 25 MG tablet Take 0.5  tablets (12.5 mg total) by mouth 2 (two) times daily. 180 tablet 3   magnesium oxide (MAG-OX) 400 MG tablet Take 400 mg by mouth daily at 12 noon.      mometasone (ASMANEX) 220 MCG/INH inhaler Inhale 2 puffs into the lungs as needed.     montelukast (SINGULAIR) 10 MG tablet Take 1 tablet (10 mg total) by mouth at bedtime. PLEASE SCHEDULE APPOINTMENT. 30 tablet 0   Multiple Vitamins-Minerals (VITAMIN D3 COMPLETE PO) Take 1,000 Int'l Units/1.46m by mouth 2 (two) times daily.     pantoprazole (PROTONIX) 20 MG tablet TAKE 1 TABLET TWICE DAILY 180 tablet 1   Polyethyl Glycol-Propyl Glycol (SYSTANE OP) Apply 2-3 drops to eye 3 (three) times daily as needed (dry eyes).     potassium chloride (KLOR-CON) 10  MEQ tablet TAKE 1/2 TABLET ON MONDAY, WEDNESDAY AND FRIDAY . CAN TAKE EXTRA AS NEEDED 45 tablet 3   Psyllium (METAMUCIL) 48.57 % POWD Take 1 Dose by mouth as needed.     simvastatin (ZOCOR) 40 MG tablet TAKE 1 TABLET EVERY DAY (NEED MD APPOINTMENT FOR REFILLS) 90 tablet 3   spironolactone (ALDACTONE) 25 MG tablet TAKE 1 TABLET EVERY DAY 90 tablet 3   tamsulosin (FLOMAX) 0.4 MG CAPS capsule Take 0.4 mg by mouth daily.     torsemide (DEMADEX) 20 MG tablet TAKE 1 TABLET BY MOUTH AS DIRECTED ON MONDAY, WEDNESDAY, FRIDAY AND SATURDAY 52 tablet 2   Vitamins-Lipotropics (LIPO FLAVONOID PLUS) TABS Take 3 tablets by mouth daily in the afternoon.     warfarin (COUMADIN) 5 MG tablet Take 1/2 a tablet to 1 tablet by mouth daily as directed by the coumadin clinic 90 tablet 0   No current facility-administered medications on file prior to visit.   Allergies  Allergen Reactions   Other Other (See Comments)   Amoxicillin Rash and Other (See Comments)    Has patient had a PCN reaction causing immediate rash, facial/tongue/throat swelling, SOB or lightheadedness with hypotension: Yes Has patient had a PCN reaction causing severe rash involving mucus membranes or skin necrosis: No Has patient had a PCN reaction that required hospitalization: No Has patient had a PCN reaction occurring within the last 10 years: No If all of the above answers are "NO", then may proceed with Cephalosporin use.    Penicillins Rash    Has patient had a PCN reaction causing immediate rash, facial/tongue/throat swelling, SOB or lightheadedness with hypotension: Yes Has patient had a PCN reaction causing severe rash involving mucus membranes or skin necrosis: No Has patient had a PCN reaction that required hospitalization: No Has patient had a PCN reaction occurring within the last 10 years: No If all of the above answers are "NO", then may proceed with Cephalosporin use.     Current Medications, Allergies, Past Medical History,  Past Surgical History, Family History and Social History were reviewed in [employer].   Review of Systems:   Constitutional: Negative for fever, sweats, chills or weight loss.  Respiratory: See HPI.   Cardiovascular: Negative for chest pain, palpitations and leg swelling.  Gastrointestinal: See HPI.  Musculoskeletal: Negative for back pain or muscle aches.  Neurological: Negative for dizziness, headaches or paresthesias.    Physical Exam: BP (!) 110/50   Pulse 64   Ht 5' 5"  (1.651 m)   Wt 128 lb 6 oz (58.2 kg)   SpO2 96%   BMI 21.36 kg/m   Wt Readings from Last 3 Encounters:  10/20/20 128 lb  6 oz (58.2 kg)  10/06/20 128 lb 6.4 oz (58.2 kg)  07/07/20 131 lb 8 oz (59.6 kg)    General: 80 year old male, he appears thinner today when compared to his exam 05/2020. Head: Normocephalic and atraumatic. Eyes: No scleral icterus. Conjunctiva pink . Ears: Normal auditory acuity. Mouth: Dentition intact. No ulcers or lesions.  Lungs: Clear throughout to auscultation. Heart: Regular rate and rhythm, no murmur. Chest: AICD generator palpated to left upper chest. Abdomen: Soft, nontender and nondistended. No masses or hepatomegaly. Normal bowel sounds x 4 quadrants.  Rectal: Deferred Musculoskeletal: Symmetrical with no gross deformities. Extremities: No edema. Neurological: Alert oriented x 4. No focal deficits.  Psychological: Alert and cooperative. Normal mood and affect  Assessment and Recommendations:  2) 80 year old male with chronic RUQ pain with gallstones and likely has chronic cholecystitis.  Patient declined to proceed with CCK HIDA scan.  Elective cholecystectomy deferred as he is considered too high risk to undergo an elective surgery. Hopefully, he will not require a future emergency cholecystectomy.  Alk phos 112.  AST 19.  ALT 18.  Total bili elevated at 1.9.  WBC 7.9. -CTAP with oral and IV contrast to rule out any other abdominal pathology  which could explain his recurrent pain and weight loss -BMP to be completed prior to the patient proceeding with CTAP with IV contrast -IBgard 1 p.o. 2-3 times daily as needed -Avoid fatty food -Recommended Ensure or boost 1 can daily -Continue Pantoprazole 20 mg p.o. twice daily -Further recommendations to be determined after the above evaluation completed  Today's encounter was 25 minutes which included free charting, chart review, history/exam, face-to-face time used for counseling, formulating treatment plan and documentation

## 2020-10-20 NOTE — Telephone Encounter (Signed)
-----   Message from April H Pait sent at 10/20/2020  2:35 PM EDT ----- Regarding: FW: CT scan  ----- Message ----- From: Maryann Conners, NT Sent: 10/20/2020   2:33 PM EDT To: April H Pait, Roosvelt Maser, # Subject: RE: CT scan                                    FYI...   10/20/20- Pt stated that he would call back at a later date to have appt sched.Ellison Hughs ----- Message ----- From: Belva Chimes H Sent: 10/20/2020  11:20 AM EDT To: Maryann Conners, NT Subject: FW: CT scan                                     ----- Message ----- From: Cardell Peach I, CMA Sent: 10/20/2020  11:19 AM EDT To: April H Pait, Toni M Cosgrove Subject: CT scan                                        RADIOLOGY [facility] Phone: [phone] Fax: [phone]   Patient Name: [name] DOB: [birth year] MRN #: [id number]  Imaging Ordered: CT abd pelvis with contrast. Oral contrast already provided to patient  Diagnosis: RUQ pain, weight loss  Ordering Provider: Noralyn Pick, CRNP  Is a Prior Authorization needed? We are in the process of obtaining it now  Is the patient Diabetic? No  Does the patient have Hypertension? Yes  Does the patient have any implanted devices or hardware? No  Date of last BUN/Creat, if needed? 10/20/20  Patient Weight? 128lb  Is the patient able to get on the table? Yes  Has the patient been diagnosed with COVID? No  Is the patient waiting on COVID testing results? No  Thank you for your assistance! [facility]

## 2020-10-20 NOTE — Progress Notes (Signed)
RADIOLOGY SCHEDULING REQUEST FOR CT abd pelvis with contrast [facility] Scheduling via secure staff message.

## 2020-10-20 NOTE — Patient Instructions (Signed)
If you are age 80 or older, your body mass index should be between 23-30. Your Body mass index is 21.36 kg/m. If this is out of the aforementioned range listed, please consider follow up with your Primary Care Provider.  The [facility] providers would like to encourage you to use [facility] to communicate with providers for non-urgent requests or questions.  Due to long hold times on the telephone, sending your provider a message by [facility] may be faster and more efficient way to get a response. Please allow 48 business hours for a response.  Please remember that this is for non-urgent requests/questions.  IMAGING: You will be contacted by [facility] Scheduling (Your caller ID will indicate phone # [phone]) in the next 2 days to schedule your Abdominal CT Scan. If you have not heard from them within 2 business days, please call [place] at [phone] to follow up on the status of your appointment.    RECOMMENDATIONS: IBgard 1 capsule 3 times a day as needed for abdominal pain/gas/bloating. Take a probiotic of your choice once a day. Ensure or Boost 1 can/bottle a day. Gas-X 1 tablet twice a day as needed.  LABS:  Lab work has been ordered for you today. Our lab is located in the basement. Press "B" on the elevator. The lab is located at the first door on the left as you exit the elevator.  HEALTHCARE LAWS AND MY CHART RESULTS: Due to recent changes in healthcare laws, you may see the results of your imaging and laboratory studies on MyChart before your provider has had a chance to review them.   We understand that in some cases there may be results that are confusing or concerning to you. Not all laboratory results come back in the same time frame and the provider may be waiting for multiple results in order to interpret others.  Please give [name] 48 hours in order for your provider to thoroughly review all the results before contacting the office for  clarification of your results.   It was great seeing you today! Thank you for entrusting me with your care and choosing [facility].  Noralyn Pick, CRNP

## 2020-10-23 NOTE — Progress Notes (Signed)
Agree with assessment and plan as outlined.  

## 2020-10-26 NOTE — Progress Notes (Signed)
Remote ICD transmission.   

## 2020-10-27 ENCOUNTER — Other Ambulatory Visit: Payer: Self-pay

## 2020-10-27 ENCOUNTER — Ambulatory Visit (HOSPITAL_BASED_OUTPATIENT_CLINIC_OR_DEPARTMENT_OTHER)
Admission: RE | Admit: 2020-10-27 | Discharge: 2020-10-27 | Disposition: A | Discharge status: Home / Self Care | Payer: Medicare HMO | Source: Ambulatory Visit | Attending: Nurse Practitioner | Admitting: Nurse Practitioner

## 2020-10-27 DIAGNOSIS — R634 Abnormal weight loss: Secondary | ICD-10-CM | POA: Insufficient documentation

## 2020-10-27 DIAGNOSIS — R109 Unspecified abdominal pain: Secondary | ICD-10-CM | POA: Diagnosis not present

## 2020-10-27 DIAGNOSIS — R1011 Right upper quadrant pain: Secondary | ICD-10-CM | POA: Diagnosis not present

## 2020-10-27 MED ORDER — IOHEXOL 350 MG/ML SOLN
60.0000 mL | Freq: Once | INTRAVENOUS | Status: AC | PRN
  Administered 2020-10-27: 60 mL via INTRAVENOUS

## 2020-10-31 ENCOUNTER — Ambulatory Visit (INDEPENDENT_AMBULATORY_CARE_PROVIDER_SITE_OTHER): Payer: Medicare HMO

## 2020-10-31 DIAGNOSIS — I5022 Chronic systolic (congestive) heart failure: Secondary | ICD-10-CM

## 2020-10-31 DIAGNOSIS — Z95810 Presence of automatic (implantable) cardiac defibrillator: Secondary | ICD-10-CM

## 2020-11-01 ENCOUNTER — Other Ambulatory Visit (HOSPITAL_COMMUNITY): Payer: Self-pay

## 2020-11-01 MED ORDER — WARFARIN SODIUM 5 MG PO TABS: ORAL_TABLET | ORAL | 0 refills | Status: DC

## 2020-11-02 DIAGNOSIS — E7800 Pure hypercholesterolemia, unspecified: Secondary | ICD-10-CM | POA: Diagnosis not present

## 2020-11-02 DIAGNOSIS — I10 Essential (primary) hypertension: Secondary | ICD-10-CM | POA: Diagnosis not present

## 2020-11-02 DIAGNOSIS — Z95810 Presence of automatic (implantable) cardiac defibrillator: Secondary | ICD-10-CM | POA: Diagnosis not present

## 2020-11-02 DIAGNOSIS — H6981 Other specified disorders of Eustachian tube, right ear: Secondary | ICD-10-CM | POA: Diagnosis not present

## 2020-11-02 DIAGNOSIS — Z23 Encounter for immunization: Secondary | ICD-10-CM | POA: Diagnosis not present

## 2020-11-02 DIAGNOSIS — I480 Paroxysmal atrial fibrillation: Secondary | ICD-10-CM | POA: Diagnosis not present

## 2020-11-02 DIAGNOSIS — K4090 Unilateral inguinal hernia, without obstruction or gangrene, not specified as recurrent: Secondary | ICD-10-CM | POA: Diagnosis not present

## 2020-11-02 DIAGNOSIS — I2510 Atherosclerotic heart disease of native coronary artery without angina pectoris: Secondary | ICD-10-CM | POA: Diagnosis not present

## 2020-11-02 DIAGNOSIS — I5022 Chronic systolic (congestive) heart failure: Secondary | ICD-10-CM | POA: Diagnosis not present

## 2020-11-02 NOTE — Progress Notes (Signed)
EPIC Encounter for ICM Monitoring  Patient Name: [name] is a 80 y.o. male Date: 11/02/2020 Primary Care Physican: [name], MD Primary Cardiologist: Kelly/Bensimhon Electrophysiologist: [name] BiV Pacing: 96.2%  11/02/2020 Weight: 132 lbs   Time in AT/AF  0.0 hr/day (0.0%) (on Coumadin)                                                  Spoke with patient and reports feeling well at this time.  Denies fluid symptoms.     Optivol thoracic impedance suggesting normal fluid levels.     Prescribed:   Torsemide 20 mg 1 tablet tree times a week Mon, Wed & Friday.  Can take extra as needed Potassium 10 mEq take 0.5 tablet three times a week Mon, Wed & Friday.  Can take extra as needed Spironolactone 25 mg take 1 tablet daily   Labs: 06/21/2020 Creatinine 0.86, BUN 17, Potassium 3.6, Sodium 134, GFr >6 0A complete set of results can be found in Results Review   Recommendations: No changes and encouraged to call if experiencing any fluid symptoms.   Follow-up plan: ICM clinic phone appointment on 12/05/2020.   91 day device clinic remote transmission 01/19/2021.     EP/Cardiology Office Visits:  Recall  06/23/2021 with Dr [name].      Copy of ICM check sent to Dr. [name].      3 month ICM trend: 10/31/2020.    1 Year ICM trend:       [name], RN 11/02/2020 4:41 PM

## 2020-11-03 ENCOUNTER — Telehealth: Payer: Self-pay

## 2020-11-03 ENCOUNTER — Telehealth (HOSPITAL_COMMUNITY): Payer: Self-pay | Admitting: Cardiology

## 2020-11-03 ENCOUNTER — Telehealth: Payer: Self-pay | Admitting: Nurse Practitioner

## 2020-11-03 ENCOUNTER — Other Ambulatory Visit: Payer: Self-pay

## 2020-11-03 ENCOUNTER — Other Ambulatory Visit (INDEPENDENT_AMBULATORY_CARE_PROVIDER_SITE_OTHER): Payer: Medicare HMO

## 2020-11-03 DIAGNOSIS — R1011 Right upper quadrant pain: Secondary | ICD-10-CM

## 2020-11-03 LAB — HEPATIC FUNCTION PANEL
ALT: 15 U/L (ref 0–53)
AST: 18 U/L (ref 0–37)
Albumin: 3.7 g/dL (ref 3.5–5.2)
Alkaline Phosphatase: 127 U/L — ABNORMAL HIGH (ref 39–117)
Bilirubin, Direct: 0.7 mg/dL — ABNORMAL HIGH (ref 0.0–0.3)
Total Bilirubin: 1.7 mg/dL — ABNORMAL HIGH (ref 0.2–1.2)
Total Protein: 6.6 g/dL (ref 6.0–8.3)

## 2020-11-03 LAB — LIPASE: Lipase: 198 U/L — ABNORMAL HIGH (ref 11.0–59.0)

## 2020-11-03 NOTE — Telephone Encounter (Signed)
Pt returned your call and wanted to give you the name of his heart doctor, stating he did a CT scan last week and the information you need would probably best come from him.  Dr. Zoila Shutter, [phone]  Thank you.

## 2020-11-03 NOTE — Telephone Encounter (Signed)
The spouse and the patient call with concerns.They are talking to me on speaker phone. The patient tells me he is supposed to have a "full body scan" and he wants a provider to talk with Dr [name] made aware of all that has occurred. The patient does not want to do anything until Dr [name] is aware and agrees on the plan of care. I was asked to relay this information to you and Dr Havery Moros.  Please advise.

## 2020-11-03 NOTE — Telephone Encounter (Signed)
See CTAP notes today

## 2020-11-03 NOTE — Telephone Encounter (Signed)
Patient and patients wife called with multiple concerns about recent CT. Large PE seen and patient was initially  advised to report to ER however seen by PCP on 10/6 and vitals were stable. PCP ordered labs and referred to pulmonology.   Patient wanted to sure Dr [name] is aware of these results and if a thoracentesis is ordered in the future, will it be ok to proceed? Pt/pts wife reports they were told to get an ok from Dr [name] before any procedure

## 2020-11-04 ENCOUNTER — Telehealth: Payer: Self-pay

## 2020-11-04 NOTE — Telephone Encounter (Signed)
Spoke with patient.  He is asking about tests that were completed by GI regarding fluid on the lungs. Advised he would need to call the GI provider that he has been speaking with regarding the results of CT scan.Marland Kitchen  He asked again about the 10/31/2020 remote transmission and advised the report is suggesting normal fluid levels.  Explained it is possible to have fluid that may not show on the device report and should follow up the GI physician regarding his concerns and to get more information.

## 2020-11-07 ENCOUNTER — Other Ambulatory Visit: Payer: Self-pay | Admitting: Family Medicine

## 2020-11-07 DIAGNOSIS — J90 Pleural effusion, not elsewhere classified: Secondary | ICD-10-CM

## 2020-11-07 NOTE — Telephone Encounter (Signed)
Pt aware and voiced understanding 

## 2020-11-07 NOTE — Telephone Encounter (Signed)
Dr. [name] was informed of the patient's CTAP finding which showed a moderate to large pleural effusion.  I called [name] and informed him Dr. Tempie Hoist was updated regarding the development of a moderate to large right pleural effusion. [name] was reminded to follow up with his [facility] PCP Dr. Kenton Kingfisher regarding his pleural effusion and Dr. Kenton Kingfisher should communicate with Dr. Tempie Hoist if any cardiology assistance needed.  See CTAP notes. Chest CT ordered by Dr. Kenton Kingfisher.

## 2020-11-07 NOTE — Telephone Encounter (Signed)
Dr. [name], [facility] patient had an abd/pelvic CT scan done due to having RUQ pain. A moderate to large pleural effusion was identified and he was advised to follow up with his PCP regarding this finding, he will need further evaluation,/chest CT and likely will need a thoracentesis.  [name] wanted you to be aware of this development as he may require future procedure i.e. thoracentesis.  Any further questions regarding his pulmonary evaluation to be addressed by his PCP Dr. Kenton Kingfisher.  Thank you.

## 2020-11-08 ENCOUNTER — Other Ambulatory Visit: Payer: Self-pay

## 2020-11-08 ENCOUNTER — Ambulatory Visit
Admission: RE | Admit: 2020-11-08 | Discharge: 2020-11-08 | Disposition: A | Discharge status: Home / Self Care | Payer: Medicare HMO | Source: Ambulatory Visit | Attending: Family Medicine | Admitting: Family Medicine

## 2020-11-08 DIAGNOSIS — J90 Pleural effusion, not elsewhere classified: Secondary | ICD-10-CM

## 2020-11-08 DIAGNOSIS — I700 Atherosclerosis of aorta: Secondary | ICD-10-CM | POA: Diagnosis not present

## 2020-11-08 DIAGNOSIS — J9811 Atelectasis: Secondary | ICD-10-CM | POA: Diagnosis not present

## 2020-11-09 ENCOUNTER — Ambulatory Visit (INDEPENDENT_AMBULATORY_CARE_PROVIDER_SITE_OTHER): Payer: Medicare HMO

## 2020-11-09 DIAGNOSIS — Z5181 Encounter for therapeutic drug level monitoring: Secondary | ICD-10-CM

## 2020-11-09 LAB — POCT INR: INR: 3.7 — AB (ref 2.0–3.0)

## 2020-11-09 NOTE — Patient Instructions (Signed)
Hold today and Thursday and then Continue taking 1 tablet daily except 1/2 tablet each Monday, Wednesday and Friday.  Repeat INR in 3 weeks

## 2020-11-21 DIAGNOSIS — K4090 Unilateral inguinal hernia, without obstruction or gangrene, not specified as recurrent: Secondary | ICD-10-CM | POA: Diagnosis not present

## 2020-11-21 DIAGNOSIS — N3001 Acute cystitis with hematuria: Secondary | ICD-10-CM | POA: Diagnosis not present

## 2020-11-21 DIAGNOSIS — I2510 Atherosclerotic heart disease of native coronary artery without angina pectoris: Secondary | ICD-10-CM | POA: Diagnosis not present

## 2020-11-21 DIAGNOSIS — I480 Paroxysmal atrial fibrillation: Secondary | ICD-10-CM | POA: Diagnosis not present

## 2020-11-21 DIAGNOSIS — I10 Essential (primary) hypertension: Secondary | ICD-10-CM | POA: Diagnosis not present

## 2020-11-21 DIAGNOSIS — Z95810 Presence of automatic (implantable) cardiac defibrillator: Secondary | ICD-10-CM | POA: Diagnosis not present

## 2020-11-21 DIAGNOSIS — H9313 Tinnitus, bilateral: Secondary | ICD-10-CM | POA: Diagnosis not present

## 2020-11-21 DIAGNOSIS — E7800 Pure hypercholesterolemia, unspecified: Secondary | ICD-10-CM | POA: Diagnosis not present

## 2020-11-21 DIAGNOSIS — I5022 Chronic systolic (congestive) heart failure: Secondary | ICD-10-CM | POA: Diagnosis not present

## 2020-11-27 ENCOUNTER — Encounter (HOSPITAL_COMMUNITY): Payer: Self-pay | Admitting: Emergency Medicine

## 2020-11-27 ENCOUNTER — Other Ambulatory Visit: Payer: Self-pay

## 2020-11-27 ENCOUNTER — Emergency Department (HOSPITAL_COMMUNITY): Payer: Medicare HMO

## 2020-11-27 ENCOUNTER — Emergency Department (HOSPITAL_COMMUNITY)
Admission: EM | Admit: 2020-11-27 | Discharge: 2020-11-27 | Disposition: A | Discharge status: Home / Self Care | Payer: Medicare HMO | Attending: Emergency Medicine | Admitting: Emergency Medicine

## 2020-11-27 ENCOUNTER — Telehealth: Payer: Self-pay | Admitting: Medical

## 2020-11-27 DIAGNOSIS — I110 Hypertensive heart disease with heart failure: Secondary | ICD-10-CM | POA: Insufficient documentation

## 2020-11-27 DIAGNOSIS — J449 Chronic obstructive pulmonary disease, unspecified: Secondary | ICD-10-CM | POA: Diagnosis not present

## 2020-11-27 DIAGNOSIS — I2510 Atherosclerotic heart disease of native coronary artery without angina pectoris: Secondary | ICD-10-CM | POA: Diagnosis not present

## 2020-11-27 DIAGNOSIS — Z79899 Other long term (current) drug therapy: Secondary | ICD-10-CM | POA: Diagnosis not present

## 2020-11-27 DIAGNOSIS — Z7901 Long term (current) use of anticoagulants: Secondary | ICD-10-CM | POA: Diagnosis not present

## 2020-11-27 DIAGNOSIS — Z95810 Presence of automatic (implantable) cardiac defibrillator: Secondary | ICD-10-CM | POA: Diagnosis not present

## 2020-11-27 DIAGNOSIS — I5023 Acute on chronic systolic (congestive) heart failure: Secondary | ICD-10-CM | POA: Diagnosis not present

## 2020-11-27 DIAGNOSIS — Z955 Presence of coronary angioplasty implant and graft: Secondary | ICD-10-CM | POA: Diagnosis not present

## 2020-11-27 DIAGNOSIS — I4892 Unspecified atrial flutter: Secondary | ICD-10-CM | POA: Diagnosis not present

## 2020-11-27 DIAGNOSIS — J45909 Unspecified asthma, uncomplicated: Secondary | ICD-10-CM | POA: Insufficient documentation

## 2020-11-27 DIAGNOSIS — Z85038 Personal history of other malignant neoplasm of large intestine: Secondary | ICD-10-CM | POA: Diagnosis not present

## 2020-11-27 DIAGNOSIS — R000 Tachycardia, unspecified: Secondary | ICD-10-CM | POA: Diagnosis not present

## 2020-11-27 LAB — CBC WITH DIFFERENTIAL/PLATELET
Abs Immature Granulocytes: 0.02 10*3/uL (ref 0.00–0.07)
Basophils Absolute: 0.1 10*3/uL (ref 0.0–0.1)
Basophils Relative: 1 %
Eosinophils Absolute: 0.3 10*3/uL (ref 0.0–0.5)
Eosinophils Relative: 3 %
HCT: 48.6 % (ref 39.0–52.0)
Hemoglobin: 15.6 g/dL (ref 13.0–17.0)
Immature Granulocytes: 0 %
Lymphocytes Relative: 18 %
Lymphs Abs: 1.7 10*3/uL (ref 0.7–4.0)
MCH: 29.7 pg (ref 26.0–34.0)
MCHC: 32.1 g/dL (ref 30.0–36.0)
MCV: 92.6 fL (ref 80.0–100.0)
Monocytes Absolute: 1.1 10*3/uL — ABNORMAL HIGH (ref 0.1–1.0)
Monocytes Relative: 12 %
Neutro Abs: 6 10*3/uL (ref 1.7–7.7)
Neutrophils Relative %: 66 %
Platelets: 334 10*3/uL (ref 150–400)
RBC: 5.25 MIL/uL (ref 4.22–5.81)
RDW: 15.2 % (ref 11.5–15.5)
WBC: 9.2 10*3/uL (ref 4.0–10.5)
nRBC: 0 % (ref 0.0–0.2)

## 2020-11-27 LAB — COMPREHENSIVE METABOLIC PANEL
ALT: 20 U/L (ref 0–44)
AST: 26 U/L (ref 15–41)
Albumin: 3.5 g/dL (ref 3.5–5.0)
Alkaline Phosphatase: 163 U/L — ABNORMAL HIGH (ref 38–126)
Anion gap: 9 (ref 5–15)
BUN: 21 mg/dL (ref 8–23)
CO2: 26 mmol/L (ref 22–32)
Calcium: 9.6 mg/dL (ref 8.9–10.3)
Chloride: 100 mmol/L (ref 98–111)
Creatinine, Ser: 1.01 mg/dL (ref 0.61–1.24)
GFR, Estimated: >60 mL/min (ref >60)
Glucose, Bld: 91 mg/dL (ref 70–99)
Potassium: 4.2 mmol/L (ref 3.5–5.1)
Sodium: 135 mmol/L (ref 135–145)
Total Bilirubin: 1.3 mg/dL — ABNORMAL HIGH (ref 0.3–1.2)
Total Protein: 7.3 g/dL (ref 6.5–8.1)

## 2020-11-27 LAB — URINALYSIS, ROUTINE W REFLEX MICROSCOPIC
Bacteria, UA: NONE SEEN
Bilirubin Urine: NEGATIVE
Glucose, UA: >=500 mg/dL — AB
Hgb urine dipstick: NEGATIVE
Ketones, ur: NEGATIVE mg/dL
Nitrite: NEGATIVE
Protein, ur: 100 mg/dL — AB
Specific Gravity, Urine: 1.015 (ref 1.005–1.030)
WBC, UA: >50 WBC/hpf — ABNORMAL HIGH (ref 0–5)
pH: 6 (ref 5.0–8.0)

## 2020-11-27 LAB — PROTIME-INR
INR: 2.7 — ABNORMAL HIGH (ref 0.8–1.2)
Prothrombin Time: 29 seconds — ABNORMAL HIGH (ref 11.4–15.2)

## 2020-11-27 NOTE — ED Provider Notes (Signed)
Emergency Medicine Provider Triage Evaluation Note  [name] , a 80 y.o. male  was evaluated in triage.  Pt complains of high heart rate.  He checks his HR and BP every morning.  Normally his heart rate is in the 50/60s but has been in the 110s today which is abnormal for him.   He can tell that he feels different, says this feels different than his a-fib. He has been taking taking doxycycline for a urinary issue for the past 4-5 days.   Review of Systems  Positive: Tachycardia, UTI Negative: Abdominal pain, chest pain  Physical Exam  BP 102/66   Pulse (!) 113   Temp 97.8 F (36.6 C) (Oral)   Resp 18   SpO2 99%  Gen:   Awake, no distress   Resp:  Normal effort  MSK:   Moves extremities without difficulty  Other:  Normal speech  Medical Decision Making  Medically screening exam initiated at 6:41 PM.  Appropriate orders placed.  NORVAL SLAVEN was informed that the remainder of the evaluation will be completed by another provider, this initial triage assessment does not replace that evaluation, and the importance of remaining in the ED until their evaluation is complete.     Lorin Glass, PA-C 11/27/20 1847    Tegeler, Gwenyth Allegra, MD 11/27/20 2056

## 2020-11-27 NOTE — Telephone Encounter (Signed)
   Patient called the after hours line with concerns for elevated HR this morning.   Got up this morning and check his vitals and noticed pulse was 114 this morning which is out of the ordinary. BP was in the 110s/60s at the time. He reports HR is typically in the low 60s. He does not notice any palpitations or racing heart beat sensations. He denies recent CP, SOB, dizziness, lightheadedness, pre-syncope, or syncope. Overall he feels like his usual self. He states he has not had any known episodes of Afib in the past couple years. He does note recently starting doxycycline for a UTI. Suspect he may be having an episode of atrial fibrillation in the setting of an infection. We discussed ongoing close monitoring at home with low threshold for ED visit if he develops CP/SOB, dizziness/lightheadedness/syncope. He reports compliance with his coumadin and has an upcoming visit with the coumadin clinic 11/30/20 which I encouraged him to keep in light of recent antibiotics use to ensure INR is stable.   Vitals at the time of my call are BP 90/60 and HR 113. He took his bisoprolol 1 hour ago. Hesitant to increase bisoprolol given soft blood pressures. Hopeful we can get him into the office early this week for close monitoring. Again, ED precautions were reviewed.   I will route to Dr. [name] as Juluis Rainier and will include Kevan Rosebush in hopes that we can get the patient in to the office early this week for close monitoring.   Abigail Butts, PA-C 11/27/20; 12:26 PM

## 2020-11-27 NOTE — ED Triage Notes (Signed)
Pt reports elevated HR today.  States it is normally around 60.  Mild SOB and dizziness.  Denies pain.

## 2020-11-27 NOTE — ED Provider Notes (Signed)
[facility] EMERGENCY DEPARTMENT Provider Note   CSN: [id number] Arrival date & time: 11/27/20  1742     History Chief Complaint  Patient presents with   Tachycardia    [name] is a 80 y.o. male.  HPI Patient presents for fast heart rate.  States he woke up this morning and when he checked his heart rate to the side of being around 60 it was 110.  Overall feels pretty good.  No chest pain.  No real trouble breathing.  Is on doxycycline for UTI from his PCP.  No fevers or chills.  No nausea or vomiting.  No shortness of breath.  States his blood pressure is where it usually has been.  Had discussed with cardiology and was told to come in here.  No swelling.  States his weight is doing well at home.    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   Cardiomyopathy, ischemic 2011   with EF 25-35% by echo   Coronary artery disease    Hx MI 1992, CABG 1998 , Nuc study 11.2013 large scar but no ischemia   GERD (gastroesophageal reflux disease)    H/O myocardial infarction, greater than 8 weeks 1992   large ant wall injury   NSVT (nonsustained ventricular tachycardia)    Paroxysmal atrial fibrillation [facility])     Patient Active Problem List   Diagnosis Date Noted   Acute on chronic systolic heart failure ([place]) 07/06/2020   Vitamin D deficiency 07/06/2020   Essential hypertension 07/06/2020   Chronic obstructive pulmonary disease, unspecified ([place]) 07/06/2020   Abdominal pain    Altered bowel habits    Benign neoplasm of colon    Early satiety 09/11/2017   Encounter for therapeutic drug monitoring 10/22/2016   Congestive heart failure, NYHA class 3 ([place]) 09/10/2016   LBBB (left bundle branch block) 11/24/2015   Generalized abdominal fullness 06/17/2015   Pseudogout of knee    NSVT (nonsustained  ventricular tachycardia)    Cholecystitis    Hyperlipidemia LDL goal <70 07/08/2014   Hemorrhoids, internal 05/24/2014   Abdominal pain, chronic, epigastric 04/08/2014   Functional dyspepsia 04/08/2014   Visual field defect 12/30/2012   Bradycardia 08/11/2012   Atherosclerotic heart disease of native coronary artery without angina pectoris    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[place], 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   History of ventricular tachycardia 05/04/2008   GERD 03/02/2008   Asthma 10/09/2007   Irritable bowel syndrome 10/09/2007   Ischemic cardiomyopathy 05/13/2007    Past Surgical History:  Procedure Laterality Date   BIOPSY  11/04/2018   Procedure: BIOPSY;  Surgeon: Yetta Flock, MD;  Location: [facility];  Service: Gastroenterology;;   Zollie Beckers N/A 09/10/2016   Procedure: BiV ICD Upgrade;  Surgeon: Deboraha Sprang, MD;  Location: [place] CV LAB;  Service: Cardiovascular;  Laterality: N/A;   CATARACT EXTRACTION, BILATERAL     COLONOSCOPY WITH PROPOFOL N/A 11/04/2018   Procedure: COLONOSCOPY WITH PROPOFOL;  Surgeon: Yetta Flock, MD;  Location: [facility] ENDOSCOPY;  Service: Gastroenterology;  Laterality: N/A;   CORONARY ARTERY BYPASS GRAFT  1998   x 5   HERNIA REPAIR  1960's   right inguinal   ICD GENERATOR CHANGE  01/2008, 2018   medtronic, hx+ EP study   KNEE SURGERY  1960s  right, acl repair    NASAL SINUS SURGERY  05/31/2010   Dr. Benjamine Mola   POLYPECTOMY  11/04/2018   Procedure: POLYPECTOMY;  Surgeon: Yetta Flock, MD;  Location: [facility] ENDOSCOPY;  Service: Gastroenterology;;   TEE WITHOUT CARDIOVERSION N/A 07/21/2019   Procedure: TRANSESOPHAGEAL ECHOCARDIOGRAM (TEE);  Surgeon: Jolaine Artist, MD;  Location: [facility] ENDOSCOPY;  Service: Cardiovascular;  Laterality: N/A;   UPPER GI ENDOSCOPY         Family History  Problem Relation Age of Onset   Heart disease Father        questionable   Stroke Mother    Heart failure  Sister    Healthy Brother    Healthy Sister    Parkinsonism Brother    Colon cancer Neg Hx     Social History   Tobacco Use   Smoking status: Never   Smokeless tobacco: Never  Vaping Use   Vaping Use: Never used  Substance Use Topics   Alcohol use: Yes    Alcohol/week: 2.0 standard drinks    Types: 2 Glasses of wine per week    Comment: socially   Drug use: No    Home Medications Prior to Admission medications   Medication Sig Start Date End Date Taking? Authorizing Provider  acetaminophen (TYLENOL) 650 MG CR tablet Take 650 mg by mouth 2 (two) times daily.     [provider]  allopurinol (ZYLOPRIM) 300 MG tablet Take 300 mg by mouth daily.    [provider]  benzonatate (TESSALON) 100 MG capsule Take by mouth as needed for cough.    [provider]  bisoprolol (ZEBETA) 5 MG tablet TAKE 1/2 TABLET EVERY DAY 06/30/20   Bensimhon, Shaune Pascal, MD  cetirizine (ZYRTEC) 10 MG tablet Take 10 mg by mouth daily.    [provider]  dapagliflozin propanediol (FARXIGA) 10 MG TABS tablet Take 1 tablet (10 mg total) by mouth daily before breakfast. 02/10/20   Bensimhon, Shaune Pascal, MD  dicyclomine (BENTYL) 10 MG capsule TAKE 1 CAPSULE EVERY 8 HOURS AS NEEDED FOR SPASM(S) 09/22/20   Armbruster, Carlota Raspberry, MD  isosorbide mononitrate (IMDUR) 30 MG 24 hr tablet Take 0.5 tablets (15 mg total) by mouth daily. 02/18/20   Bensimhon, Shaune Pascal, MD  LORazepam (ATIVAN) 1 MG tablet Take 0.25-0.5 mg by mouth at bedtime.    [provider]  losartan (COZAAR) 25 MG tablet Take 0.5 tablets (12.5 mg total) by mouth 2 (two) times daily. 09/27/20   Bensimhon, Shaune Pascal, MD  magnesium oxide (MAG-OX) 400 MG tablet Take 400 mg by mouth daily at 12 noon.     [provider]  mometasone [facility]) 220 MCG/INH inhaler Inhale 2 puffs into the lungs as needed.    [provider]  montelukast (SINGULAIR) 10 MG tablet Take 1 tablet (10 mg total) by mouth at bedtime.  PLEASE SCHEDULE APPOINTMENT. 02/01/15   Troy Sine, MD  Multiple Vitamins-Minerals (VITAMIN D3 COMPLETE PO) Take 1,000 Int'l Units/1.2m2 by mouth 2 (two) times daily.    [provider]  pantoprazole (PROTONIX) 20 MG tablet TAKE 1 TABLET TWICE DAILY 08/12/20   Willia Craze, NP  Polyethyl Glycol-Propyl Glycol (SYSTANE OP) Apply 2-3 drops to eye 3 (three) times daily as needed (dry eyes).    [provider]  potassium chloride (KLOR-CON) 10 MEQ tablet TAKE 1/2 TABLET ON MONDAY, WEDNESDAY AND FRIDAY . CAN TAKE EXTRA AS NEEDED 07/07/20   Bensimhon, Shaune Pascal, MD  Psyllium (METAMUCIL) 48.57 %  POWD Take 1 Dose by mouth as needed.    [provider]  simvastatin (ZOCOR) 40 MG tablet TAKE 1 TABLET EVERY DAY (NEED MD APPOINTMENT FOR REFILLS) 09/12/20   Troy Sine, MD  spironolactone (ALDACTONE) 25 MG tablet TAKE 1 TABLET EVERY DAY 02/11/20   Bensimhon, Shaune Pascal, MD  tamsulosin (FLOMAX) 0.4 MG CAPS capsule Take 0.4 mg by mouth daily. 12/08/18   [provider]  torsemide (DEMADEX) 20 MG tablet TAKE 1 TABLET BY MOUTH AS DIRECTED ON MONDAY, WEDNESDAY, FRIDAY AND SATURDAY 10/13/20   Bensimhon, Shaune Pascal, MD  Vitamins-Lipotropics (LIPO FLAVONOID PLUS) TABS Take 3 tablets by mouth daily in the afternoon.    [provider]  warfarin (COUMADIN) 5 MG tablet Take 1/2 a tablet to 1 tablet by mouth daily as directed by the coumadin clinic 11/01/20   Sherren Mocha, MD    Allergies    Other, Amoxicillin, and Penicillins  Review of Systems   Review of Systems  Constitutional:  Negative for appetite change.  HENT:  Negative for congestion.   Respiratory:  Negative for shortness of breath.   Cardiovascular:  Positive for palpitations. Negative for chest pain.  Gastrointestinal:  Negative for abdominal pain.  Genitourinary:  Positive for dysuria.  Musculoskeletal:  Negative for back pain.  Skin:  Negative for rash.  Neurological:  Negative for weakness.   Psychiatric/Behavioral:  Negative for confusion.    Physical Exam Updated Vital Signs BP 106/70   Pulse (!) 112   Temp 98 F (36.7 C) (Oral)   Resp 17   SpO2 99%   Physical Exam Vitals and nursing note reviewed.  HENT:     Head: Normocephalic.  Eyes:     Pupils: Pupils are equal, round, and reactive to light.  Cardiovascular:     Rate and Rhythm: Regular rhythm. Tachycardia present.  Pulmonary:     Breath sounds: No wheezing or rhonchi.     Comments: AICD to left chest wall. Abdominal:     Tenderness: There is no abdominal tenderness.  Musculoskeletal:        General: No tenderness.     Cervical back: Neck supple.  Skin:    General: Skin is warm.     Capillary Refill: Capillary refill takes less than 2 seconds.  Neurological:     Mental Status: He is alert and oriented to person, place, and time.    ED Results / Procedures / Treatments   Labs (all labs ordered are listed, but only abnormal results are displayed) Labs Reviewed  COMPREHENSIVE METABOLIC PANEL - Abnormal; Notable for the following components:      Result Value   Alkaline Phosphatase 163 (*)    Total Bilirubin 1.3 (*)    All other components within normal limits  CBC WITH DIFFERENTIAL/PLATELET - Abnormal; Notable for the following components:   Monocytes Absolute 1.1 (*)    All other components within normal limits  PROTIME-INR - Abnormal; Notable for the following components:   Prothrombin Time 29.0 (*)    INR 2.7 (*)    All other components within normal limits  URINALYSIS, ROUTINE W REFLEX MICROSCOPIC - Abnormal; Notable for the following components:   Color, Urine AMBER (*)    APPearance TURBID (*)    Glucose, UA >=500 (*)    Protein, ur 100 (*)    Leukocytes,Ua LARGE (*)    WBC, UA >50 (*)    All other components within normal limits  URINE CULTURE    EKG EKG  Interpretation  Date/Time:  Sunday November 27 2020 18:30:29 EDT Ventricular Rate:  113 PR Interval:    QRS Duration: 160 QT  Interval:  420 QTC Calculation: 576 R Axis:   -89 Text Interpretation: Atrial flutter with 2:1 A-V conduction Left axis deviation Non-specific intra-ventricular conduction block Minimal voltage criteria for LVH, may be normal variant ( Cornell product ) Abnormal ECG Confirmed by Davonna Belling [phone]) on 11/27/2020 6:59:30 PM  Radiology DG Chest 2 View  Result Date: 11/27/2020 CLINICAL DATA:  Tachycardia. EXAM: CHEST - 2 VIEW COMPARISON:  Chest x-ray 05/27/2019, CT chest 11/08/2020. FINDINGS: The heart and mediastinal contours are unchanged. Left chest wall 3 lead cardiac pacemaker and defibrillator. No focal consolidation. No pulmonary edema. No pleural effusion. No pneumothorax. No acute osseous abnormality. IMPRESSION: No active cardiopulmonary disease. Electronically Signed   By: Iven Finn M.D.   On: 11/27/2020 19:32    Procedures Procedures   Medications Ordered in ED Medications - No data to display  ED Course  I have reviewed the triage vital signs and the nursing notes.  Pertinent labs & imaging results that were available during my care of the patient were reviewed by me and considered in my medical decision making (see chart for details).    MDM Rules/Calculators/A&P                           Patient presents with tachycardia.  Woke with this morning.  Appears to be in atrial flutter with a mildly rapid rate.  Appears to be in a 2-1 block.  Rate is running around 110.  AICD interrogated and does show a 2-1 block with a ventricular rate of around 240.  Discussed with Dr. Humphrey Rolls, from cardiology.  Patient is really asymptomatic.  Feeling well.  No lightheadedness dizziness.  No shortness of breath.  Does not even really feel his heart going fast.  States he just noticed it because he was checking his vitals this morning.  With him feeling well decision was made that he either we could do an ER cardioversion or short-term follow-up with cardiology as an outpatient.   Discussing with patient he would rather see his primary cardiologist, Dr. Sung Amabile and have him make a decision about cardioversion.  Patient does have some white cells in his urine.  Currently on doxycycline for UTI.  Culture sent since patient cannot tell me if his PCP had ordered it.  We will continue doxycycline at this time though.  Discharge home Patient is therapeutic on his Coumadin  Chadsvasc of 5 Final Clinical Impression(s) / ED Diagnoses Final diagnoses:  Atrial flutter, unspecified type [facility])    Rx / [place] Orders ED Discharge Orders     None        Davonna Belling, MD 11/27/20 2103

## 2020-11-27 NOTE — Discharge Instructions (Signed)
Call Dr. Clayborne Dana office tomorrow.  Return to the ER if you are feeling lightheaded dizzy or more short of breath.

## 2020-11-28 ENCOUNTER — Ambulatory Visit (HOSPITAL_COMMUNITY)
Admission: RE | Admit: 2020-11-28 | Discharge: 2020-11-28 | Disposition: A | Discharge status: Home / Self Care | Payer: Medicare HMO | Source: Ambulatory Visit | Attending: Internal Medicine | Admitting: Internal Medicine

## 2020-11-28 ENCOUNTER — Encounter (HOSPITAL_COMMUNITY): Payer: Self-pay | Admitting: Internal Medicine

## 2020-11-28 ENCOUNTER — Other Ambulatory Visit (HOSPITAL_COMMUNITY): Payer: Self-pay | Admitting: *Deleted

## 2020-11-28 VITALS — BP 110/70 | HR 111 | Wt 122.6 lb

## 2020-11-28 DIAGNOSIS — I480 Paroxysmal atrial fibrillation: Secondary | ICD-10-CM | POA: Insufficient documentation

## 2020-11-28 DIAGNOSIS — Z95810 Presence of automatic (implantable) cardiac defibrillator: Secondary | ICD-10-CM | POA: Insufficient documentation

## 2020-11-28 DIAGNOSIS — Z8249 Family history of ischemic heart disease and other diseases of the circulatory system: Secondary | ICD-10-CM | POA: Insufficient documentation

## 2020-11-28 DIAGNOSIS — I340 Nonrheumatic mitral (valve) insufficiency: Secondary | ICD-10-CM | POA: Diagnosis not present

## 2020-11-28 DIAGNOSIS — I255 Ischemic cardiomyopathy: Secondary | ICD-10-CM | POA: Insufficient documentation

## 2020-11-28 DIAGNOSIS — I252 Old myocardial infarction: Secondary | ICD-10-CM | POA: Diagnosis not present

## 2020-11-28 DIAGNOSIS — Z7984 Long term (current) use of oral hypoglycemic drugs: Secondary | ICD-10-CM | POA: Insufficient documentation

## 2020-11-28 DIAGNOSIS — Z7901 Long term (current) use of anticoagulants: Secondary | ICD-10-CM | POA: Insufficient documentation

## 2020-11-28 DIAGNOSIS — Z8616 Personal history of COVID-19: Secondary | ICD-10-CM | POA: Insufficient documentation

## 2020-11-28 DIAGNOSIS — I4892 Unspecified atrial flutter: Secondary | ICD-10-CM

## 2020-11-28 DIAGNOSIS — I447 Left bundle-branch block, unspecified: Secondary | ICD-10-CM | POA: Insufficient documentation

## 2020-11-28 DIAGNOSIS — I083 Combined rheumatic disorders of mitral, aortic and tricuspid valves: Secondary | ICD-10-CM | POA: Insufficient documentation

## 2020-11-28 DIAGNOSIS — I5022 Chronic systolic (congestive) heart failure: Secondary | ICD-10-CM

## 2020-11-28 DIAGNOSIS — I2510 Atherosclerotic heart disease of native coronary artery without angina pectoris: Secondary | ICD-10-CM | POA: Diagnosis not present

## 2020-11-28 DIAGNOSIS — R109 Unspecified abdominal pain: Secondary | ICD-10-CM | POA: Diagnosis not present

## 2020-11-28 DIAGNOSIS — Z880 Allergy status to penicillin: Secondary | ICD-10-CM | POA: Diagnosis not present

## 2020-11-28 DIAGNOSIS — Z79899 Other long term (current) drug therapy: Secondary | ICD-10-CM | POA: Insufficient documentation

## 2020-11-28 DIAGNOSIS — Z951 Presence of aortocoronary bypass graft: Secondary | ICD-10-CM | POA: Insufficient documentation

## 2020-11-28 DIAGNOSIS — R634 Abnormal weight loss: Secondary | ICD-10-CM | POA: Diagnosis not present

## 2020-11-28 NOTE — Telephone Encounter (Signed)
Spoke with pt appt scheduled for today at 1:40.

## 2020-11-28 NOTE — H&P (View-Only) (Signed)
Patient ID: [name], male   DOB: 10/31/1940, 80 y.o.   MRN: [id number]    Advanced Heart Failure Clinic Note   Date:  11/28/2020   ID:  [name], [name] 1940/04/24, MRN [id number]  PCP:  [name], MD  Primary Cardiologist:  [name] Referring: Dr. [name]   History of Present Illness: [name] is a 80 y.o. 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 In September 1998 he underwent CABG  after an unsuccessful attempt at stenting of his proximal LAD by [name]. Ejection fraction was 25-30%. He is s/p ICD with upgrade to CRT-D   Myoview 11/13   in November 2013 showed a large area of scar in the LAD territory (extent 44%) involving the mid to apical anterior, apical and infero-apical to mid infero-septal and apical lateral wall without associated ischemia.  Admitted 5/16 atrial fibrillation and started on Tikosyn.  He was enrolled in the genetic AF trial (bucindolol vs Toprol). He did poorly in the trial in the setting of titration of beta-blocker. He was referred to HF Clinic  Echo 4/21 EF 20% LV markedly dilated. Severe MR  RV moderately reduced   Echo reviewed with Dr. Copper regarding MitraClip and LV failure felt to be too advanced  Had mild COVID in April 2022.   Last seen for f/u 09/22. Volume looked okay. No changes made.  Saw GI on 10/20/20. Completing further workup with [name] for ongoing abdominal pain, has f/u tomorrow. CT A/P obtained - incidentally noted moderate to large right pleural effusion. Subsequent CT chest with small pleural effusion.  Seen in ED yesterday with complaints of elevated heart rate. He was in atrial flutter with rate in 110s. Of note, on course of antibiotics for UTI. Given that patient was stable without much symptoms he was discharged home and referred for close f/u.   Patient reports increased fatigue and shortness of breath  last few days. Continues to ambulate between 3K -4K steps daily. No chest pain or sensation of palpitations. Has been losing weight, down 6 lb from last visit. Decreased appetite. Started using protein shakes.  ICD interrogated in clinic: Decline in rate Bi-V pacing 10/29 same day as went into atrial flutter, average ventricular rate > 100 bpm, Optivol threshold okay but medium risk of HF event next 30 days   CPX 5/21  FVC 3.17 (89%)      FEV1 2.35 (88%)        FEV1/FVC 74 (98%)        MVV 71 (66%)   Resting HR: 69 Standing HR: 72 Peak HR: 105   (74% age predicted max HR)  BP rest: 104/58 Standing BP: 90/56 BP peak: 112/60  Peak VO2: 15.5 (66% predicted peak VO2)  VE/VCO2 slope:  43  OUES: 1.12  Peak RER: 1.11   Ventilatory Threshold: 13.6 (57% predicted or measured peak VO2)  VE/MVV:  71%  O2pulse:  9   (90% predicted O2pulse)   Echo 04/04/17 EF 20% with mild to mod reduction RV function.   CPX 02/17/16 Resting HR: 73 Peak HR: 108   (75% age predicted max HR) BP rest: 110/58 BP peak: 150/56 Peak VO2: 14.4 (58% predicted peak VO2) VE/VCO2 slope:  33 OUES: 1.32 Peak RER: 1.10 PETCO2 at peak:  33 O2pulse:  9   (82% predicted O2pulse)  Echo 12/17 EF 15-20%, Moderate MR, RV with reduced function  Echo 5/17 EF 20% RV mildly down.    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      allopurinol (ZYLOPRIM) 300 MG tablet Take 300 mg by mouth daily.     benzonatate (TESSALON) 100 MG capsule Take by mouth as needed for cough.     bisoprolol (ZEBETA) 5 MG tablet TAKE 1/2 TABLET EVERY DAY 45 tablet 3   cetirizine (ZYRTEC) 10 MG tablet Take 10 mg by mouth daily.     dapagliflozin propanediol (FARXIGA) 10 MG TABS tablet Take 1 tablet (10 mg total) by mouth daily before breakfast. 90 tablet 3   dicyclomine (BENTYL) 10 MG capsule TAKE 1 CAPSULE EVERY 8 HOURS AS NEEDED FOR SPASM(S) 180 capsule 1   isosorbide mononitrate (IMDUR) 30 MG 24 hr tablet Take 0.5 tablets (15 mg total) by mouth daily. 45 tablet 0   LORazepam (ATIVAN) 1 MG tablet Take 0.25-0.5 mg by mouth at bedtime.     losartan (COZAAR) 25 MG tablet Take 0.5 tablets (12.5 mg total) by mouth 2 (two) times daily. 180 tablet 3   magnesium oxide (MAG-OX) 400 MG tablet Take 400 mg by mouth daily at 12 noon.      mometasone (ASMANEX) 220 MCG/INH inhaler Inhale 2 puffs into the lungs as needed.     montelukast (SINGULAIR) 10 MG tablet Take 1 tablet (10 mg total) by mouth at bedtime. PLEASE SCHEDULE APPOINTMENT. 30 tablet 0   Multiple Vitamins-Minerals (VITAMIN D3 COMPLETE PO) Take 1,000 Int'l Units/1.3m2 by mouth 2 (two) times daily.     pantoprazole (PROTONIX) 20 MG tablet TAKE 1 TABLET TWICE DAILY 180 tablet 1   Polyethyl Glycol-Propyl Glycol (SYSTANE OP) Apply 2-3 drops to eye 3 (three) times daily as needed (dry eyes).     potassium chloride (KLOR-CON) 10 MEQ tablet TAKE 1/2 TABLET ON MONDAY, WEDNESDAY AND FRIDAY . CAN TAKE EXTRA AS NEEDED 45 tablet 3   Psyllium (METAMUCIL) 48.57 % POWD Take 1 Dose by mouth as needed.     simvastatin (ZOCOR) 40 MG tablet TAKE 1 TABLET EVERY DAY (NEED MD APPOINTMENT FOR REFILLS) 90 tablet 3   spironolactone (ALDACTONE) 25 MG tablet TAKE 1 TABLET EVERY DAY 90 tablet 3   tamsulosin (FLOMAX) 0.4 MG CAPS capsule Take 0.4 mg by  mouth daily.     torsemide (DEMADEX) 20 MG tablet TAKE 1 TABLET BY MOUTH AS DIRECTED ON MONDAY, WEDNESDAY, FRIDAY AND SATURDAY 52 tablet 2   Vitamins-Lipotropics (LIPO FLAVONOID PLUS) TABS Take 3 tablets by mouth daily in the afternoon.     warfarin (COUMADIN) 5 MG tablet Take 1/2 a tablet to 1 tablet by mouth daily as directed by the coumadin clinic 45 tablet 0   No current facility-administered medications for this encounter.    Allergies:  Allergies  Allergen Reactions   Other Other (See Comments)   Amoxicillin Rash and Other (See Comments)    Has patient had a PCN reaction causing immediate rash, facial/tongue/throat swelling, SOB or lightheadedness with hypotension: Yes Has patient had a PCN reaction causing severe rash involving mucus membranes or skin necrosis: No Has patient had a PCN reaction that required hospitalization: No Has patient had a PCN reaction occurring within the last 10 years: No If all of the above answers are "NO", then may proceed with Cephalosporin use.    Penicillins Rash    Has patient had a PCN reaction causing immediate rash, facial/tongue/throat swelling, SOB or lightheadedness with hypotension: Yes Has patient had a PCN reaction causing severe rash involving mucus membranes or skin necrosis: No Has patient had a PCN reaction that required hospitalization: No Has patient had a PCN reaction occurring within the last 10 years: No If all of the above answers are "NO", then may proceed with Cephalosporin use.     Social History:  The patient  reports that he has never smoked. He has never used smokeless tobacco. He reports current alcohol use of about 2.0 standard drinks per week. He reports that he does not use drugs.   Family history:   Family History  Problem Relation Age of Onset   Heart disease Father        questionable   Stroke Mother    Heart failure Sister    Healthy Brother    Healthy Sister    Parkinsonism Brother    Colon cancer Neg  Hx    Review of systems complete and found to be negative unless listed in HPI.    PHYSICAL EXAM: There were no vitals filed for this visit.  Wt Readings from Last 3 Encounters:  10/20/20 58.2 kg (128 lb 6 oz)  10/06/20 58.2 kg (128 lb 6.4 oz)  07/07/20 59.6 kg (131 lb 8 oz)   General:  Thin, elderly male. No distress. HEENT: normal Neck: supple. JVP 8-9 cm Carotids 2+ bilat; no bruits.  Cor: PMI nondisplaced. Irregular rhythm, tachycardic. No rubs, gallops + MR murmur Lungs: clear Abdomen: soft, nontender, nondistended. No hepatosplenomegaly.  Extremities: no cyanosis, clubbing, rash, edema Neuro: alert & orientedx3, cranial nerves grossly intact. moves all 4 extremities w/o difficulty. Affect pleasant  ECG today: Atrial flutter with ventricular rate of 108 bpm, intermittent ventricular pacing   ASSESSMENT AND PLAN:  1. 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 PVO2 improved since 2018 but VEVCO2 elevated suggestive of high pulmonary pressures with exercise - Echo 05/18/19 EF 20% with moderate RV dysfunction. LV markedly dilated LVIDd 7.0 cm Severe MR, mod AI, mod TR.  - Stable NYHA IIIa  symptoms in face of severe biV dysfunction. - Rash after taking Entresto.  - Continue losartan 12.5 bid (recently cut back from 25 bid) - Continue Farxiga 10 - Continue spiro 25 mg daily - Continue bisoprolol 2.5 mg daily.  - Unable to tolerate higher doses of GDMT with low BP - Volume appears okay on exam and on device check. Continue torsemide 20mg  M/W/F/Sat . We did discuss the fact that he is not candidate for advanced therapies including transplant (age), VAD (size and RV dysfunction). MitraClip (LV failure too advanced)  - Not candidate for Barostim - ICD interrogated in Clinic as above  2. PAF/new atrial flutter - Ongoing since  10/29. Rate not controlled. Has lost BiV pacing. We were able to  get him scheduled for DCCV tomorrow, 11/01. No need for TEE, INR has been therapeutic. - If has recurrence, may need to consider addition of amiodarone. Previously on Tikosyn, D/C'd 2016 - Continue coumadin. Does not want Eliquis - No bleeding    3. CAD s/p previous anterior infarct - No s/s ischemia - Off ASA due to warfarin. Continue statin. - LDL followed by PCP. On statin.  4. LBBB - Now s/p CRT. Decrease in BiV pacing with atrial flutter. - No change  5. Severe MR - not candidate for Clip   6. Weight loss/abdominal pain - encouraged increased calorie intake  - Undergoing workup with GI   F/u: 4 months  FINCH, LINDSAY N, PA-C  1:49 PM  Patient seen and examined with the above-signed Advanced Practice Provider and/or Housestaff. I personally reviewed laboratory data, imaging studies and relevant notes. I independently examined the patient and formulated the important aspects of the plan. I have edited the note to reflect any of my changes or salient points. I have personally discussed the plan with the patient and/or family.  He presents to Clinic today for an unscheduled visit due to recurrent AFL. Recently treated for UTI. Noticed HR has been faster when he checks his BP. Went to ED yesterday and found to be in AFL with RVR,.   Denies palpitations, edema, orthopnea or PND. Continues to lose weight. Reports appetite ok but gets full quickly. Has f/u with GI tomorrow. .   General:  Elderly frail and thin appearing. No resp difficulty HEENT: normal Neck: supple. no JVD. Carotids 2+ bilat; no bruits. No lymphadenopathy or thryomegaly appreciated. Cor: PMI nondisplaced. Irregular and tachy  2/6 MR  Lungs: clear Abdomen: soft, nontender, nondistended. No hepatosplenomegaly. No bruits or masses. Good bowel sounds. Extremities: no cyanosis, clubbing, rash, edema Neuro: alert & orientedx3, cranial nerves grossly intact. moves all 4 extremities w/o difficulty. Affect pleasant  He  has recurrent AFL. INRs have been therapeutic. Will proceed with DC-CV tomorrow. I continue to be very concerned about him with progressive weight loss and fraility. Does not appear to have low flow state from his HF. Suspect overall decline is multifactorial and not all attributable to progressive HF. Not candidate for advanced therapies, unfortunately.   I discussed my concern about his prognosis to his wife.   Total time spent 35 minutes. Over half that time spent discussing above.    Glori Bickers, MD  11:47 PM

## 2020-11-28 NOTE — Progress Notes (Signed)
Patient ID: ALESSIO BOGAN, male   DOB: [birth year], 80 y.o.   MRN: [id number]    Advanced Heart Failure Clinic Note   Date:  11/28/2020   ID:  [name], [name] [birth year], MRN [id number]  PCP:  [name], MD  Primary Cardiologist:  [name] Referring: Dr. [name]   History of Present Illness: [name] is a 80 y.o. 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 In September 1998 he underwent CABG  after an unsuccessful attempt at stenting of his proximal LAD by [name]. Ejection fraction was 25-30%. He is s/p ICD with upgrade to CRT-D   Myoview 11/13   in November 2013 showed a large area of scar in the LAD territory (extent 44%) involving the mid to apical anterior, apical and infero-apical to mid infero-septal and apical lateral wall without associated ischemia.  Admitted 5/16 atrial fibrillation and started on Tikosyn.  He was enrolled in the genetic AF trial (bucindolol vs Toprol). He did poorly in the trial in the setting of titration of beta-blocker. He was referred to HF Clinic  Echo 4/21 EF 20% LV markedly dilated. Severe MR  RV moderately reduced   Echo reviewed with Dr. Copper regarding MitraClip and LV failure felt to be too advanced  Had mild COVID in April 2022.   Last seen for f/u 09/22. Volume looked okay. No changes made.  Saw GI on 10/20/20. Completing further workup with [name] for ongoing abdominal pain, has f/u tomorrow. CT A/P obtained - incidentally noted moderate to large right pleural effusion. Subsequent CT chest with small pleural effusion.  Seen in ED yesterday with complaints of elevated heart rate. He was in atrial flutter with rate in 110s. Of note, on course of antibiotics for UTI. Given that patient was stable without much symptoms he was discharged home and referred for close f/u.   Patient reports increased fatigue and shortness of breath  last few days. Continues to ambulate between 3K -4K steps daily. No chest pain or sensation of palpitations. Has been losing weight, down 6 lb from last visit. Decreased appetite. Started using protein shakes.  ICD interrogated in clinic: Decline in rate Bi-V pacing 10/29 same day as went into atrial flutter, average ventricular rate > 100 bpm, Optivol threshold okay but medium risk of HF event next 30 days   CPX 5/21  FVC 3.17 (89%)      FEV1 2.35 (88%)        FEV1/FVC 74 (98%)        MVV 71 (66%)   Resting HR: 69 Standing HR: 72 Peak HR: 105   (74% age predicted max HR)  BP rest: 104/58 Standing BP: 90/56 BP peak: 112/60  Peak VO2: 15.5 (66% predicted peak VO2)  VE/VCO2 slope:  43  OUES: 1.12  Peak RER: 1.11   Ventilatory Threshold: 13.6 (57% predicted or measured peak VO2)  VE/MVV:  71%  O2pulse:  9   (90% predicted O2pulse)   Echo 04/04/17 EF 20% with mild to mod reduction RV function.   CPX 02/17/16 Resting HR: 73 Peak HR: 108   (75% age predicted max HR) BP rest: 110/58 BP peak: 150/56 Peak VO2: 14.4 (58% predicted peak VO2) VE/VCO2 slope:  33 OUES: 1.32 Peak RER: 1.10 PETCO2 at peak:  33 O2pulse:  9   (82% predicted O2pulse)  Echo 12/17 EF 15-20%, Moderate MR, RV with reduced function  Echo 5/17 EF 20% RV mildly down.    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      allopurinol (ZYLOPRIM) 300 MG tablet Take 300 mg by mouth daily.     benzonatate (TESSALON) 100 MG capsule Take by mouth as needed for cough.     bisoprolol (ZEBETA) 5 MG tablet TAKE 1/2 TABLET EVERY DAY 45 tablet 3   cetirizine (ZYRTEC) 10 MG tablet Take 10 mg by mouth daily.     dapagliflozin propanediol (FARXIGA) 10 MG TABS tablet Take 1 tablet (10 mg total) by mouth daily before breakfast. 90 tablet 3   dicyclomine (BENTYL) 10 MG capsule TAKE 1 CAPSULE EVERY 8 HOURS AS NEEDED FOR SPASM(S) 180 capsule 1   isosorbide mononitrate (IMDUR) 30 MG 24 hr tablet Take 0.5 tablets (15 mg total) by mouth daily. 45 tablet 0   LORazepam (ATIVAN) 1 MG tablet Take 0.25-0.5 mg by mouth at bedtime.     losartan (COZAAR) 25 MG tablet Take 0.5 tablets (12.5 mg total) by mouth 2 (two) times daily. 180 tablet 3   magnesium oxide (MAG-OX) 400 MG tablet Take 400 mg by mouth daily at 12 noon.      mometasone (ASMANEX) 220 MCG/INH inhaler Inhale 2 puffs into the lungs as needed.     montelukast (SINGULAIR) 10 MG tablet Take 1 tablet (10 mg total) by mouth at bedtime. PLEASE SCHEDULE APPOINTMENT. 30 tablet 0   Multiple Vitamins-Minerals (VITAMIN D3 COMPLETE PO) Take 1,000 Int'l Units/1.61m2 by mouth 2 (two) times daily.     pantoprazole (PROTONIX) 20 MG tablet TAKE 1 TABLET TWICE DAILY 180 tablet 1   Polyethyl Glycol-Propyl Glycol (SYSTANE OP) Apply 2-3 drops to eye 3 (three) times daily as needed (dry eyes).     potassium chloride (KLOR-CON) 10 MEQ tablet TAKE 1/2 TABLET ON MONDAY, WEDNESDAY AND FRIDAY . CAN TAKE EXTRA AS NEEDED 45 tablet 3   Psyllium (METAMUCIL) 48.57 % POWD Take 1 Dose by mouth as needed.     simvastatin (ZOCOR) 40 MG tablet TAKE 1 TABLET EVERY DAY (NEED MD APPOINTMENT FOR REFILLS) 90 tablet 3   spironolactone (ALDACTONE) 25 MG tablet TAKE 1 TABLET EVERY DAY 90 tablet 3   tamsulosin (FLOMAX) 0.4 MG CAPS capsule Take 0.4 mg by  mouth daily.     torsemide (DEMADEX) 20 MG tablet TAKE 1 TABLET BY MOUTH AS DIRECTED ON MONDAY, WEDNESDAY, FRIDAY AND SATURDAY 52 tablet 2   Vitamins-Lipotropics (LIPO FLAVONOID PLUS) TABS Take 3 tablets by mouth daily in the afternoon.     warfarin (COUMADIN) 5 MG tablet Take 1/2 a tablet to 1 tablet by mouth daily as directed by the coumadin clinic 45 tablet 0   No current facility-administered medications for this encounter.    Allergies:  Allergies  Allergen Reactions   Other Other (See Comments)   Amoxicillin Rash and Other (See Comments)    Has patient had a PCN reaction causing immediate rash, facial/tongue/throat swelling, SOB or lightheadedness with hypotension: Yes Has patient had a PCN reaction causing severe rash involving mucus membranes or skin necrosis: No Has patient had a PCN reaction that required hospitalization: No Has patient had a PCN reaction occurring within the last 10 years: No If all of the above answers are "NO", then may proceed with Cephalosporin use.    Penicillins Rash    Has patient had a PCN reaction causing immediate rash, facial/tongue/throat swelling, SOB or lightheadedness with hypotension: Yes Has patient had a PCN reaction causing severe rash involving mucus membranes or skin necrosis: No Has patient had a PCN reaction that required hospitalization: No Has patient had a PCN reaction occurring within the last 10 years: No If all of the above answers are "NO", then may proceed with Cephalosporin use.     Social History:  The patient  reports that he has never smoked. He has never used smokeless tobacco. He reports current alcohol use of about 2.0 standard drinks per week. He reports that he does not use drugs.   Family history:   Family History  Problem Relation Age of Onset   Heart disease Father        questionable   Stroke Mother    Heart failure Sister    Healthy Brother    Healthy Sister    Parkinsonism Brother    Colon cancer Neg  Hx    Review of systems complete and found to be negative unless listed in HPI.    PHYSICAL EXAM: There were no vitals filed for this visit.  Wt Readings from Last 3 Encounters:  10/20/20 58.2 kg (128 lb 6 oz)  10/06/20 58.2 kg (128 lb 6.4 oz)  07/07/20 59.6 kg (131 lb 8 oz)   General:  Thin, elderly male. No distress. HEENT: normal Neck: supple. JVP 8-9 cm Carotids 2+ bilat; no bruits.  Cor: PMI nondisplaced. Irregular rhythm, tachycardic. No rubs, gallops + MR murmur Lungs: clear Abdomen: soft, nontender, nondistended. No hepatosplenomegaly.  Extremities: no cyanosis, clubbing, rash, edema Neuro: alert & orientedx3, cranial nerves grossly intact. moves all 4 extremities w/o difficulty. Affect pleasant  ECG today: Atrial flutter with ventricular rate of 108 bpm, intermittent ventricular pacing   ASSESSMENT AND PLAN:  1. 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 PVO2 improved since 2018 but VEVCO2 elevated suggestive of high pulmonary pressures with exercise - Echo 05/18/19 EF 20% with moderate RV dysfunction. LV markedly dilated LVIDd 7.0 cm Severe MR, mod AI, mod TR.  - Stable NYHA IIIa  symptoms in face of severe biV dysfunction. - Rash after taking Entresto.  - Continue losartan 12.5 bid (recently cut back from 25 bid) - Continue Farxiga 10 - Continue spiro 25 mg daily - Continue bisoprolol 2.5 mg daily.  - Unable to tolerate higher doses of GDMT with low BP - Volume appears okay on exam and on device check. Continue torsemide 20mg  M/W/F/Sat . We did discuss the fact that he is not candidate for advanced therapies including transplant (age), VAD (size and RV dysfunction). MitraClip (LV failure too advanced)  - Not candidate for Barostim - ICD interrogated in Clinic as above  2. PAF/new atrial flutter - Ongoing since  10/29. Rate not controlled. Has lost BiV pacing. We were able to  get him scheduled for DCCV tomorrow, 11/01. No need for TEE, INR has been therapeutic. - If has recurrence, may need to consider addition of amiodarone. Previously on Tikosyn, D/C'd 2016 - Continue coumadin. Does not want Eliquis - No bleeding    3. CAD s/p previous anterior infarct - No s/s ischemia - Off ASA due to warfarin. Continue statin. - LDL followed by PCP. On statin.  4. LBBB - Now s/p CRT. Decrease in BiV pacing with atrial flutter. - No change  5. Severe MR - not candidate for Clip   6. Weight loss/abdominal pain - encouraged increased calorie intake  - Undergoing workup with GI   F/u: 4 months  FINCH, LINDSAY N, PA-C  1:49 PM  Patient seen and examined with the above-signed Advanced Practice Provider and/or Housestaff. I personally reviewed laboratory data, imaging studies and relevant notes. I independently examined the patient and formulated the important aspects of the plan. I have edited the note to reflect any of my changes or salient points. I have personally discussed the plan with the patient and/or family.  He presents to Clinic today for an unscheduled visit due to recurrent AFL. Recently treated for UTI. Noticed HR has been faster when he checks his BP. Went to ED yesterday and found to be in AFL with RVR,.   Denies palpitations, edema, orthopnea or PND. Continues to lose weight. Reports appetite ok but gets full quickly. Has f/u with GI tomorrow. .   General:  Elderly frail and thin appearing. No resp difficulty HEENT: normal Neck: supple. no JVD. Carotids 2+ bilat; no bruits. No lymphadenopathy or thryomegaly appreciated. Cor: PMI nondisplaced. Irregular and tachy  2/6 MR  Lungs: clear Abdomen: soft, nontender, nondistended. No hepatosplenomegaly. No bruits or masses. Good bowel sounds. Extremities: no cyanosis, clubbing, rash, edema Neuro: alert & orientedx3, cranial nerves grossly intact. moves all 4 extremities w/o difficulty. Affect pleasant  He  has recurrent AFL. INRs have been therapeutic. Will proceed with DC-CV tomorrow. I continue to be very concerned about him with progressive weight loss and fraility. Does not appear to have low flow state from his HF. Suspect overall decline is multifactorial and not all attributable to progressive HF. Not candidate for advanced therapies, unfortunately.   I discussed my concern about his prognosis to his wife.   Total time spent 35 minutes. Over half that time spent discussing above.    Glori Bickers, MD  11:47 PM

## 2020-11-28 NOTE — Patient Instructions (Addendum)
EKG done today.  No Labs done today.   No medication changes were made. Please continue all current medications as prescribed.  Your physician recommends that you schedule a follow-up appointment in: 4 months  If you have any questions or concerns before your next appointment please send [name] a message through [place] or call our office at [phone].    TO LEAVE A MESSAGE FOR THE NURSE SELECT OPTION 2, PLEASE LEAVE A MESSAGE INCLUDING: YOUR NAME DATE OF BIRTH CALL BACK NUMBER REASON FOR CALL**this is important as we prioritize the call backs  YOU WILL RECEIVE A CALL BACK THE SAME DAY AS LONG AS YOU CALL BEFORE 4:00 PM   Do the following things EVERYDAY: Weigh yourself in the morning before breakfast. Write it down and keep it in a log. Take your medicines as prescribed Eat low salt foods--Limit salt (sodium) to 2000 mg per day.  Stay as active as you can everyday Limit all fluids for the day to less than 2 liters   At the [facility], you and your health needs are our priority. As part of our continuing mission to provide you with exceptional heart care, we have created designated Provider Care Teams. These Care Teams include your primary Cardiologist (physician) and Advanced Practice Providers (APPs- Physician Assistants and Nurse Practitioners) who all work together to provide you with the care you need, when you need it.   You may see any of the following providers on your designated Care Team at your next follow up: Dr Glori Bickers Dr Haynes Kerns, NP Lyda Jester, [place] Audry Riles, PharmD   Please be sure to bring in all your medications bottles to every appointment.   You are scheduled for a Cardioversion on Tuesday Nov 1st 2022 with Dr. Glori Bickers.  Please arrive at the [facility] (Main Entrance A) at [facility]: [address] [place], [place] [address] at 12pm. (1 hour prior to procedure unless lab work is needed; if  lab work is needed arrive 1.5 hours ahead)  DIET: Nothing to eat or drink after midnight except a sip of water with medications (see medication instructions below)  FYI: For your safety, and to allow [name] to monitor your vital signs accurately during the surgery/procedure we request that   if you have artificial nails, gel coating, SNS etc. Please have those removed prior to your surgery/procedure. Not having the nail coverings /polish removed may result in cancellation or delay of your surgery/procedure.   Medication Instructions: Hold Torsemide the day of your procedure  You must have a responsible person to drive you home and stay in the waiting area during your procedure. Failure to do so could result in cancellation.  Bring your insurance cards.  *Special Note: Every effort is made to have your procedure done on time. Occasionally there are emergencies that occur at the hospital that may cause delays. Please be patient if a delay does occur.

## 2020-11-29 ENCOUNTER — Ambulatory Visit (HOSPITAL_COMMUNITY): Payer: Medicare HMO | Admitting: Anesthesiology

## 2020-11-29 ENCOUNTER — Ambulatory Visit (HOSPITAL_COMMUNITY)
Admission: RE | Admit: 2020-11-29 | Discharge: 2020-11-29 | Disposition: A | Discharge status: Home / Self Care | Payer: Medicare HMO | Source: Ambulatory Visit | Attending: Internal Medicine | Admitting: Internal Medicine

## 2020-11-29 ENCOUNTER — Encounter (HOSPITAL_COMMUNITY): Payer: Self-pay | Admitting: Internal Medicine

## 2020-11-29 ENCOUNTER — Encounter (HOSPITAL_COMMUNITY)
Admission: RE | Disposition: A | Discharge status: Home / Self Care | Payer: Self-pay | Source: Ambulatory Visit | Attending: Internal Medicine

## 2020-11-29 ENCOUNTER — Other Ambulatory Visit (INDEPENDENT_AMBULATORY_CARE_PROVIDER_SITE_OTHER): Payer: Medicare HMO

## 2020-11-29 ENCOUNTER — Encounter: Payer: Self-pay | Admitting: Gastroenterology

## 2020-11-29 ENCOUNTER — Ambulatory Visit (INDEPENDENT_AMBULATORY_CARE_PROVIDER_SITE_OTHER): Payer: Medicare HMO | Admitting: Gastroenterology

## 2020-11-29 VITALS — BP 110/60 | HR 114 | Ht 65.0 in | Wt 120.2 lb

## 2020-11-29 DIAGNOSIS — Z880 Allergy status to penicillin: Secondary | ICD-10-CM | POA: Insufficient documentation

## 2020-11-29 DIAGNOSIS — I4892 Unspecified atrial flutter: Secondary | ICD-10-CM | POA: Diagnosis not present

## 2020-11-29 DIAGNOSIS — I252 Old myocardial infarction: Secondary | ICD-10-CM | POA: Insufficient documentation

## 2020-11-29 DIAGNOSIS — I480 Paroxysmal atrial fibrillation: Secondary | ICD-10-CM | POA: Diagnosis not present

## 2020-11-29 DIAGNOSIS — Z7901 Long term (current) use of anticoagulants: Secondary | ICD-10-CM | POA: Diagnosis not present

## 2020-11-29 DIAGNOSIS — Z8249 Family history of ischemic heart disease and other diseases of the circulatory system: Secondary | ICD-10-CM | POA: Diagnosis not present

## 2020-11-29 DIAGNOSIS — K219 Gastro-esophageal reflux disease without esophagitis: Secondary | ICD-10-CM | POA: Diagnosis not present

## 2020-11-29 DIAGNOSIS — Z79899 Other long term (current) drug therapy: Secondary | ICD-10-CM | POA: Diagnosis not present

## 2020-11-29 DIAGNOSIS — Z7984 Long term (current) use of oral hypoglycemic drugs: Secondary | ICD-10-CM | POA: Diagnosis not present

## 2020-11-29 DIAGNOSIS — K8020 Calculus of gallbladder without cholecystitis without obstruction: Secondary | ICD-10-CM

## 2020-11-29 DIAGNOSIS — R634 Abnormal weight loss: Secondary | ICD-10-CM | POA: Insufficient documentation

## 2020-11-29 DIAGNOSIS — R109 Unspecified abdominal pain: Secondary | ICD-10-CM | POA: Diagnosis not present

## 2020-11-29 DIAGNOSIS — R933 Abnormal findings on diagnostic imaging of other parts of digestive tract: Secondary | ICD-10-CM

## 2020-11-29 DIAGNOSIS — I447 Left bundle-branch block, unspecified: Secondary | ICD-10-CM | POA: Diagnosis not present

## 2020-11-29 DIAGNOSIS — R140 Abdominal distension (gaseous): Secondary | ICD-10-CM | POA: Diagnosis not present

## 2020-11-29 DIAGNOSIS — I2510 Atherosclerotic heart disease of native coronary artery without angina pectoris: Secondary | ICD-10-CM | POA: Insufficient documentation

## 2020-11-29 DIAGNOSIS — E559 Vitamin D deficiency, unspecified: Secondary | ICD-10-CM | POA: Diagnosis not present

## 2020-11-29 HISTORY — PX: CARDIOVERSION: SHX1299

## 2020-11-29 LAB — URINE CULTURE: Culture: NO GROWTH

## 2020-11-29 LAB — GAMMA GT: GGT: 346 U/L — ABNORMAL HIGH (ref 7–51)

## 2020-11-29 LAB — LIPASE: Lipase: 64 U/L — ABNORMAL HIGH (ref 11.0–59.0)

## 2020-11-29 SURGERY — CARDIOVERSION
Anesthesia: Monitor Anesthesia Care

## 2020-11-29 MED ORDER — LIDOCAINE 2% (20 MG/ML) 5 ML SYRINGE
INTRAMUSCULAR | Status: DC | PRN
  Administered 2020-11-29: 50 mg via INTRAVENOUS

## 2020-11-29 MED ORDER — PHENYLEPHRINE 40 MCG/ML (10ML) SYRINGE FOR IV PUSH (FOR BLOOD PRESSURE SUPPORT)
PREFILLED_SYRINGE | INTRAVENOUS | Status: DC | PRN
  Administered 2020-11-29: 120 ug via INTRAVENOUS
  Administered 2020-11-29 (×3): 80 ug via INTRAVENOUS
  Administered 2020-11-29: 120 ug via INTRAVENOUS

## 2020-11-29 MED ORDER — PROPOFOL 10 MG/ML IV BOLUS
INTRAVENOUS | Status: DC | PRN
  Administered 2020-11-29: 40 mg via INTRAVENOUS

## 2020-11-29 MED ORDER — SODIUM CHLORIDE 0.9 % IV SOLN: INTRAVENOUS | Status: DC | PRN

## 2020-11-29 NOTE — Anesthesia Procedure Notes (Signed)
Procedure Name: MAC Date/Time: 11/29/2020 1:10 PM Performed by: Harden Mo, CRNA Pre-anesthesia Checklist: Patient identified, Emergency Drugs available, Suction available and Patient being monitored Patient Re-evaluated:Patient Re-evaluated prior to induction Oxygen Delivery Method: Ambu bag Preoxygenation: Pre-oxygenation with 100% oxygen Induction Type: IV induction Placement Confirmation: positive ETCO2 and breath sounds checked- equal and bilateral Dental Injury: Teeth and Oropharynx as per pre-operative assessment

## 2020-11-29 NOTE — Discharge Instructions (Signed)

## 2020-11-29 NOTE — Transfer of Care (Signed)
Immediate Anesthesia Transfer of Care Note  Patient: [name]  Procedure(s) Performed: CARDIOVERSION  Patient Location: Endoscopy Unit  Anesthesia Type:General  Level of Consciousness: awake, alert  and oriented  Airway & Oxygen Therapy: Patient Spontanous Breathing  Post-op Assessment: Report given to RN and Post -op Vital signs reviewed and stable  Post vital signs: Reviewed and stable  Last Vitals:  Vitals Value Taken Time  BP    Temp    Pulse    Resp    SpO2      Last Pain: There were no vitals filed for this visit.       Complications: No notable events documented.

## 2020-11-29 NOTE — Anesthesia Postprocedure Evaluation (Signed)
Anesthesia Post Note  Patient: [name]  Procedure(s) Performed: CARDIOVERSION     Patient location during evaluation: Endoscopy Anesthesia Type: MAC Level of consciousness: awake and alert Pain management: pain level controlled Vital Signs Assessment: post-procedure vital signs reviewed and stable Respiratory status: spontaneous breathing, nonlabored ventilation and respiratory function stable Cardiovascular status: blood pressure returned to baseline and stable Postop Assessment: no apparent nausea or vomiting Anesthetic complications: no   No notable events documented.  Last Vitals:  Vitals:   11/29/20 1355 11/29/20 1400  BP: (!) 88/58 (!) 87/56  Pulse: 73 70  Resp: (!) 21 (!) 26  Temp:    SpO2: 99% 98%    Last Pain:  Vitals:   11/29/20 1355  TempSrc:   PainSc: 0-No pain                 Lidia Collum

## 2020-11-29 NOTE — Progress Notes (Signed)
HPI :  80 year old male here for follow-up visit for abdominal bloating and weight loss.  Recall that he has a history of CAD, MI 1992, CHF with LVEF 20%, ischemic cardiomyopathy, severe MR, AICD, LBBB, s/p CABG 1998 and atrial fibrillation on Coumadin.   I have seen in the past for some IBS type symptoms.  He had a colonoscopy with me a few years ago.  He was seen earlier in the year for intermittent upper abdominal discomfort. RUQ sonogram  06/01/2020 which showed a distended gallbladder with gallstones and a probable stone in the cystic duct vs a possible 18m gallbladder polyps verses an adherent stone. Mild diffuse gallbladder wall thickening was noted.  A CCK HIDA scan was recommended, however, the patient declined to complete this study.  He was evaluated by general surgery and the patient did not wish to pursue a cholecystectomy due to concern for his surgical risks given his cardiomyopathy.  He continues to have some intermittent abdominal discomfort in his upper abdomen.  He had also complained of some weight loss in September and was recommended to have a CT scan by CCarl Best  Report as outlined below.  Multiple findings to include suspected moderate to large pleural effusion with atelectasis.  He had some mild perihepatic ascites with infiltrative stranding of the mesentery porta hepatis and peripancreatic region.  Unclear if this was third spacing from an underlying inflammatory process such as duodenitis or groove pancreatitis.  Also noted to have some gastric wall thickening that is nonspecific and of unclear significance, although the stomach was not well distended.  He had follow-up labs which showed an elevated lipase to the high 100s.  He was eating well without any nausea or vomiting.  He had a mild elevation of alk phos and bilirubin over the past year.  He is accompanied by his wife today to discuss options.  Of note he was seen by his cardiologist Dr. BHaroldine Lawsyesterday,  noted to be in a flutter with rates in the 100s.  He is scheduled for cardioversion today.  He denies any chest pain or shortness of breath.  He had a follow-up CT scan of his chest after a CT scan which showed that the effusion was much smaller than initially thought.  He is followed by his primary care physician for that.  He states in general he has been feeling okay.  He has some epigastric bloating that comes and goes.  Does not radiate.  No severe pain.  No nausea or vomiting.  He has continued to lose some weight since his last been seen.  His baseline weight has been in the low 130s over the past year, he currently is 120 pounds today.  Looks like he is lost about 10 pounds since June.  He has not tried to lose any weight.  He seems to be eating okay at baseline, does not vomit and and is able to eat his meals but perhaps does not use much as he previously did.  He is having no blood per rectum or bowel changes.  He does not using any NSAIDs.  He takes Protonix 20 mg twice daily.  He never had a prior EGD.  He does have a defibrillator in place and not a candidate for MRI.    CT abdomen / pelvis with contrast 11/02/20:  IMPRESSION: 1. Substantial cardiomegaly. Reflux of contrast into the hepatic veins, with initial imaging of the abdomen in the early arterial phase likely due to reduced  cardiac output substantially affecting standard timing of bolus. 2. Moderate to large right pleural effusion with atelectasis in the right lower lobe. 3. Mild perihepatic ascites along with infiltrative stranding in the central mesentery, porta hepatis, peripancreatic region. This could be from third spacing of fluid although inflammatory processes such as duodenitis or groove pancreatitis could also contribute to the appearance of upper abdominal infiltrative edema. 4. Other imaging findings of potential clinical significance: Lateral gastric body wall thickening unchanged from 03/05/2018, potentially  mostly from nondistention although gastritis cannot be excluded. Left renal cyst with probably mildly complex right renal cyst. Large posterior diverticulum of the urinary bladder. Mild colonic diverticulosis. Aortic Atherosclerosis (ICD10-I70.0). Mild prostatomegaly eccentric to the right side. Speckled calcifications along the tunica albuginea in the penis may represent early Peyronie's disease. Spondylosis and degenerative disc disease at L5-S1. Small chronic left inguinal hernia containing trace fluid, an unremarkable knuckle of small bowel, and adipose tissue.   CT chest 11/08/20 -  IMPRESSION: 1. Right pleural effusion, volume estimated 500 cc. 2. Marked cardiomegaly without pericardial effusion. 3.  Aortic Atherosclerosis (ICD10-I70.0).    Past Medical History:  Diagnosis Date   Acute on chronic systolic CHF (congestive heart failure) ([place]) 06/09/2015   Automatic implantable cardiac defibrillator in situ 2002; 2010   medtronic virtuso   Cardiomyopathy, ischemic 2011   with EF 25-35% by echo   Coronary artery disease    Hx MI 1992, CABG 1998 , Nuc study 11.2013 large scar but no ischemia   GERD (gastroesophageal reflux disease)    H/O myocardial infarction, greater than 8 weeks 1992   large ant wall injury   NSVT (nonsustained ventricular tachycardia)    Paroxysmal atrial fibrillation [facility])      Past Surgical History:  Procedure Laterality Date   BIOPSY  11/04/2018   Procedure: BIOPSY;  Surgeon: Yetta Flock, MD;  Location: [facility];  Service: Gastroenterology;;   Zollie Beckers N/A 09/10/2016   Procedure: BiV ICD Upgrade;  Surgeon: Deboraha Sprang, MD;  Location: [facility];  Service: Cardiovascular;  Laterality: N/A;   CATARACT EXTRACTION, BILATERAL     COLONOSCOPY WITH PROPOFOL N/A 11/04/2018   Procedure: COLONOSCOPY WITH PROPOFOL;  Surgeon: Yetta Flock, MD;  Location: [facility];  Service: Gastroenterology;  Laterality: N/A;   CORONARY  ARTERY BYPASS GRAFT  1998   x 5   HERNIA REPAIR  1960's   right inguinal   ICD GENERATOR CHANGE  01/2008, 2018   medtronic, hx+ EP study   KNEE SURGERY  1960s   right, acl repair    NASAL SINUS SURGERY  05/31/2010   Dr. Benjamine Mola   POLYPECTOMY  11/04/2018   Procedure: POLYPECTOMY;  Surgeon: Yetta Flock, MD;  Location: [facility];  Service: Gastroenterology;;   TEE WITHOUT CARDIOVERSION N/A 07/21/2019   Procedure: TRANSESOPHAGEAL ECHOCARDIOGRAM (TEE);  Surgeon: Jolaine Artist, MD;  Location: [facility];  Service: Cardiovascular;  Laterality: N/A;   UPPER GI ENDOSCOPY     Family History  Problem Relation Age of Onset   Heart disease Father        questionable   Stroke Mother    Heart failure Sister    Healthy Brother    Healthy Sister    Parkinsonism Brother    Colon cancer Neg Hx    Social History   Tobacco Use   Smoking status: Never   Smokeless tobacco: Never  Vaping Use   Vaping Use: Never used  Substance Use Topics   Alcohol  use: Yes    Alcohol/week: 2.0 standard drinks    Types: 2 Glasses of wine per week    Comment: socially   Drug use: No   Current Outpatient Medications  Medication Sig Dispense Refill   acetaminophen (TYLENOL) 650 MG CR tablet Take 650 mg by mouth 2 (two) times daily.      allopurinol (ZYLOPRIM) 300 MG tablet Take 300 mg by mouth daily.     benzonatate (TESSALON) 100 MG capsule Take 100 mg by mouth 3 (three) times daily as needed for cough.     bisoprolol (ZEBETA) 5 MG tablet TAKE 1/2 TABLET EVERY DAY 45 tablet 3   cetirizine (ZYRTEC) 10 MG tablet Take 10 mg by mouth daily.     cholecalciferol (VITAMIN D3) 25 MCG (1000 UNIT) tablet Take 1,000 Units by mouth daily.     dapagliflozin propanediol (FARXIGA) 10 MG TABS tablet Take 1 tablet (10 mg total) by mouth daily before breakfast. 90 tablet 3   dicyclomine (BENTYL) 10 MG capsule TAKE 1 CAPSULE EVERY 8 HOURS AS NEEDED FOR SPASM(S) 180 capsule 1   doxycycline (VIBRA-TABS) 100 MG tablet  Take 100 mg by mouth 2 (two) times daily.     isosorbide mononitrate (IMDUR) 30 MG 24 hr tablet Take 0.5 tablets (15 mg total) by mouth daily. 45 tablet 0   LORazepam (ATIVAN) 1 MG tablet Take 0.25-0.5 mg by mouth at bedtime.     losartan (COZAAR) 25 MG tablet Take 0.5 tablets (12.5 mg total) by mouth 2 (two) times daily. 180 tablet 3   magnesium oxide (MAG-OX) 400 MG tablet Take 400 mg by mouth daily.     mometasone (ASMANEX) 220 MCG/INH inhaler Inhale 2 puffs into the lungs daily as needed (asthma).     montelukast (SINGULAIR) 10 MG tablet Take 1 tablet (10 mg total) by mouth at bedtime. PLEASE SCHEDULE APPOINTMENT. 30 tablet 0   pantoprazole (PROTONIX) 20 MG tablet TAKE 1 TABLET TWICE DAILY 180 tablet 1   Polyethyl Glycol-Propyl Glycol (SYSTANE OP) Place 2-3 drops into both eyes 3 (three) times daily as needed (dry eyes).     potassium chloride (KLOR-CON) 10 MEQ tablet TAKE 1/2 TABLET ON MONDAY, WEDNESDAY AND FRIDAY . CAN TAKE EXTRA AS NEEDED 45 tablet 3   Psyllium (METAMUCIL) 48.57 % POWD Take 1 Dose by mouth daily as needed (regularity).     simvastatin (ZOCOR) 40 MG tablet TAKE 1 TABLET EVERY DAY (NEED MD APPOINTMENT FOR REFILLS) 90 tablet 3   spironolactone (ALDACTONE) 25 MG tablet TAKE 1 TABLET EVERY DAY 90 tablet 3   tamsulosin (FLOMAX) 0.4 MG CAPS capsule Take 0.4 mg by mouth daily.     torsemide (DEMADEX) 20 MG tablet TAKE 1 TABLET BY MOUTH AS DIRECTED ON MONDAY, WEDNESDAY, FRIDAY AND SATURDAY 52 tablet 2   Vitamins-Lipotropics (LIPO FLAVONOID PLUS) TABS Take 3 tablets by mouth daily in the afternoon.     warfarin (COUMADIN) 5 MG tablet Take 1/2 a tablet to 1 tablet by mouth daily as directed by the coumadin clinic (Patient taking differently: Take 2.5-5 mg by mouth See admin instructions. Take 1/2 a tablet to 1 tablet by mouth daily as directed by the coumadin clinic; 2.5 mg Mon Wed and Fri, 5 mg all other days) 45 tablet 0   No current facility-administered medications for this visit.    Allergies  Allergen Reactions   Amoxicillin Rash and Other (See Comments)    Has patient had a PCN reaction causing immediate rash, facial/tongue/throat swelling, SOB  or lightheadedness with hypotension: Yes Has patient had a PCN reaction causing severe rash involving mucus membranes or skin necrosis: No Has patient had a PCN reaction that required hospitalization: No Has patient had a PCN reaction occurring within the last 10 years: No If all of the above answers are "NO", then may proceed with Cephalosporin use.    Penicillins Rash    Has patient had a PCN reaction causing immediate rash, facial/tongue/throat swelling, SOB or lightheadedness with hypotension: Yes Has patient had a PCN reaction causing severe rash involving mucus membranes or skin necrosis: No Has patient had a PCN reaction that required hospitalization: No Has patient had a PCN reaction occurring within the last 10 years: No If all of the above answers are "NO", then may proceed with Cephalosporin use.      Review of Systems: All systems reviewed and negative except where noted in HPI.    DG Chest 2 View  Result Date: 11/27/2020 CLINICAL DATA:  Tachycardia. EXAM: CHEST - 2 VIEW COMPARISON:  Chest x-ray 05/27/2019, CT chest 11/08/2020. FINDINGS: The heart and mediastinal contours are unchanged. Left chest wall 3 lead cardiac pacemaker and defibrillator. No focal consolidation. No pulmonary edema. No pleural effusion. No pneumothorax. No acute osseous abnormality. IMPRESSION: No active cardiopulmonary disease. Electronically Signed   By: Iven Finn M.D.   On: 11/27/2020 19:32   CT CHEST WO CONTRAST  Result Date: 11/08/2020 CLINICAL DATA:  Pleural effusion EXAM: CT CHEST WITHOUT CONTRAST TECHNIQUE: Multidetector CT imaging of the chest was performed following the standard protocol without IV contrast. COMPARISON:  05/27/2019 FINDINGS: Cardiovascular: Unenhanced imaging of the heart demonstrates marked  cardiomegaly without pericardial effusion. Multi lead pacer is identified. Postsurgical changes are seen from prior CABG. Extensive atherosclerosis of the native coronary vessels. Normal caliber of the thoracic aorta, with scattered atherosclerosis of the aortic arch and descending aorta. Evaluation of the vascular lumen is limited without intravenous contrast. Mediastinum/Nodes: No enlarged mediastinal or axillary lymph nodes. Thyroid gland, trachea, and esophagus demonstrate no significant findings. Lungs/Pleura: Small right pleural effusion volume estimated 500 cc. Dependent right lower lobe atelectasis. No acute airspace disease. No pneumothorax. Central airways are patent. Upper Abdomen: Mild mesenteric edema within the central upper abdomen unchanged. Otherwise no acute process. Musculoskeletal: No acute or destructive bony lesions. Reconstructed images demonstrate no additional findings. IMPRESSION: 1. Right pleural effusion, volume estimated 500 cc. 2. Marked cardiomegaly without pericardial effusion. 3.  Aortic Atherosclerosis (ICD10-I70.0). Electronically Signed   By: Randa Ngo M.D.   On: 11/08/2020 15:22    Physical Exam: BP 110/60   Pulse (!) 114   Ht _0  (1.651 m)   Wt 120 lb 4 oz (54.5 kg)   BMI 20.01 kg/m  Constitutional: Pleasant,well-developed, male in no acute distress. Abdominal: Soft, nondistended, nontender.  There are no masses palpable. Neurological: Alert and oriented to person place and time. Psychiatric: Normal mood and affect. Behavior is normal.   ASSESSMENT AND PLAN: 80 year old male here for reassessment of following:  Weight loss Bloating / Upper abdominal discomfort Abnormal CT scan of the GI tract Gallstones Heart failure  As above patient with intermittent mild upper abdominal discomfort ongoing for months.  Symptoms not typical for biliary colic, he has seen surgery in the past about his abnormal ultrasound with gallstones, they elected to hold off on  surgery given his comorbidities, namely cardiac function.  He continues to have intermittent symptoms in his upper abdomen which appears sporadic and not always postprandial.  His CT  scan was done showing findings as above, some nonspecific inflammatory changes in the upper abdomen.  His lipase was somewhat elevated but he clinically did not appear to have pancreatitis at the time, his liver enzymes are not that elevated, biliary pancreatitis would seem less likely.  Unclear what those findings represent, he does not take NSAIDs, on PPI, duodenitis perhaps less likely.  He does have some nonspecific thickening of the stomach lining as well, unclear if that is just due to underdistention or other.  Ideally would recommend an EGD to look at his stomach and make sure no pathology there to account for CT scan changes and his symptoms.  We discussed this would require endoscopy at the hospital with anesthesia support, I am going to need to talk with Dr. [name] about his candidacy for EGD in the near future. He is about to have a cardioversion later today, he needs to be more stable from cardiac perspective.  Going to repeat his lipase today emergency if that remains elevated, also check GGT, and other labs in regards to the alk phos elevation.  He is not a candidate for MRCP due to his defibrillator.  We have very likely may do a follow-up CT scan within the next month or 2 to reassess prior changes.  In the interim recommend high caloric shakes/protein supplementation aside of his meals to see if that can help him regain some weight.  We will await his labs and make follow-up recommendations once I can speak with Dr. [name] about his candidacy for EGD.  Of note, follow-up CT scan showed pleural effusion much smaller than previously thought, x-ray done a few days ago shows resolution.  Plan: - labs today - EGD at some point if / when more stable from cardiac perspective, will speak with his cardiologist -  anticipate repeat CT imaging in the next few weeks / month or 2 - high caloric shake supplements - follow up with cardiology for cardioversion / management of heart failure  Jolly Mango, MD [facility]

## 2020-11-29 NOTE — Patient Instructions (Addendum)
If you are age 80 or older, your body mass index should be between 23-30. Your Body mass index is 20.01 kg/m. If this is out of the aforementioned range listed, please consider follow up with your Primary Care Provider.  ________________________________________________________  The [facility] providers would like to encourage you to use [facility] to communicate with providers for non-urgent requests or questions.  Due to long hold times on the telephone, sending your provider a message by [facility] may be a faster and more efficient way to get a response.  Please allow 48 business hours for a response.  Please remember that this is for non-urgent requests.  _______________________________________________________  Please go to the lab in the basement of our building to have lab work done as you leave today. Hit "B" for basement when you get on the elevator.  When the doors open the lab is on your left.  We will call you with the results. Thank you.  Thank you for entrusting me with your care and for choosing [facility], Dr. [place] Cellar

## 2020-11-29 NOTE — CV Procedure (Signed)
    DIRECT CURRENT CARDIOVERSION  NAME:  [name]   MRN: [id number] DOB:  [birth year]   ADMIT DATE: 11/29/2020   INDICATIONS: Atrial flutter   PROCEDURE:   Informed consent was obtained prior to the procedure. The risks, benefits and alternatives for the procedure were discussed and the patient comprehended these risks. Once an appropriate time out was taken, the patient had the defibrillator pads placed in the anterior and posterior position. The patient then underwent sedation by the anesthesia service. Once an appropriate level of sedation was achieved, the patient received a single biphasic, synchronized 75J shock with prompt conversion to AV paced rhythm. Rhythm confirmed by ICD interrogation with MDT rep. No apparent complications.  Glori Bickers, MD  1:20 PM

## 2020-11-29 NOTE — Anesthesia Preprocedure Evaluation (Signed)
Anesthesia Evaluation  Patient identified by MRN, date of birth, ID band Patient awake    Reviewed: Allergy & Precautions, NPO status , Patient's Chart, lab work & pertinent test results, reviewed documented beta blocker date and time   History of Anesthesia Complications Negative for: history of anesthetic complications  Airway Mallampati: I  TM Distance: >3 FB Neck ROM: Full    Dental  (+) Teeth Intact   Pulmonary asthma , COPD,    Pulmonary exam normal        Cardiovascular hypertension, Pt. on medications and Pt. on home beta blockers + CAD, + Past MI, + CABG (1998) and +CHF (EF 20%)  + dysrhythmias Atrial Fibrillation and Ventricular Tachycardia + pacemaker + Cardiac Defibrillator + Valvular Problems/Murmurs MR  Rhythm:Regular Rate:Tachycardia   TEE 07/21/19: EF 20%, global hypokinesis, severe LV dilation, severely reduced RVSF, severe LAE, mod RAE, mod-sev MR, mod AR, mod TR   Neuro/Psych negative neurological ROS  negative psych ROS   GI/Hepatic Neg liver ROS, PUD, GERD  ,  Endo/Other  negative endocrine ROS  Renal/GU negative Renal ROS  negative genitourinary   Musculoskeletal  (+) Arthritis ,   Abdominal   Peds  Hematology Coumadin INR 2.7   Anesthesia Other Findings   Reproductive/Obstetrics                             Anesthesia Physical  Anesthesia Plan  ASA: 4  Anesthesia Plan: MAC   Post-op Pain Management:    Induction: Intravenous  PONV Risk Score and Plan: 1 and TIVA and Treatment may vary due to age or medical condition  Airway Management Planned: Mask  Additional Equipment:   Intra-op Plan:   Post-operative Plan:   Informed Consent: I have reviewed the patients History and Physical, chart, labs and discussed the procedure including the risks, benefits and alternatives for the proposed anesthesia with the patient or authorized representative who has  indicated his/her understanding and acceptance.       Plan Discussed with:   Anesthesia Plan Comments:         Anesthesia Quick Evaluation

## 2020-11-30 ENCOUNTER — Ambulatory Visit (INDEPENDENT_AMBULATORY_CARE_PROVIDER_SITE_OTHER): Payer: Medicare HMO

## 2020-11-30 ENCOUNTER — Telehealth: Payer: Self-pay | Admitting: Gastroenterology

## 2020-11-30 ENCOUNTER — Encounter (HOSPITAL_COMMUNITY): Payer: Self-pay | Admitting: Internal Medicine

## 2020-11-30 ENCOUNTER — Other Ambulatory Visit: Payer: Self-pay

## 2020-11-30 DIAGNOSIS — N401 Enlarged prostate with lower urinary tract symptoms: Secondary | ICD-10-CM | POA: Diagnosis not present

## 2020-11-30 DIAGNOSIS — Z5181 Encounter for therapeutic drug level monitoring: Secondary | ICD-10-CM | POA: Diagnosis not present

## 2020-11-30 DIAGNOSIS — N323 Diverticulum of bladder: Secondary | ICD-10-CM | POA: Diagnosis not present

## 2020-11-30 DIAGNOSIS — R3129 Other microscopic hematuria: Secondary | ICD-10-CM | POA: Diagnosis not present

## 2020-11-30 DIAGNOSIS — R3914 Feeling of incomplete bladder emptying: Secondary | ICD-10-CM | POA: Diagnosis not present

## 2020-11-30 LAB — POCT INR: INR: 3.6 — AB (ref 2.0–3.0)

## 2020-11-30 NOTE — Telephone Encounter (Signed)
Lm on vm for patient to return call to discuss recommendations.

## 2020-11-30 NOTE — Telephone Encounter (Signed)
Pt returned call, we have reviewed Dr. Doyne Keel recommendations as outlined below. Pt is aware that we will hold off on EGD and will complete a CT scan at the end of November. Pt is aware that we are still waiting for some labs to result but he will be contacted once they are reviewed. Pt has been scheduled for a follow up with [name] on Thursday, 01/05/21 at 11 am. Pt verbalized understanding of all information and had no concerns at the end of the call.   CT reminder in epic.

## 2020-11-30 NOTE — Telephone Encounter (Signed)
I have been in touch with Dr. [name] about this patient - [name] underwent cardioversion yesterday which was successful but [name] had some hypotension and prolonged recovery with only 40mg  of propofol. [name] has end stage heart failure and is not a candidate for more advanced therapies, prognosis is not good in this light.  I think risks of EGD may outweigh benefits right now, I would hold off on this and would like to pursue another CT scan abdomen / pelvis with contrast end of November / early December to check for interval changes. Lipase repeat and mildly elevated. GGT elevated, lab workup pending. [name] is not able to have an MRCP due to ICD in place.    [name] can you please let the patient know I spoke with Dr. [name], I think EGD may be difficult for him and risks may outweigh benefits. Would hold off on EGD right now and plan for interval CT scan as outlined - hold off on EGD - plan on CT abdomen / pelvis with contrast end of November / early December if you can coordinate - I will follow up pending labs - I would like to see him back in the office early December for reassessment if you can book appointment. Thanks

## 2020-11-30 NOTE — Patient Instructions (Signed)
Hold today and then decrease to  0.5 tablet daily except 1 tablet each Monday, Wednesday and Friday.  Repeat INR in 3 weeks

## 2020-12-01 NOTE — Interval H&P Note (Signed)
History and Physical Interval Note:  12/01/2020 10:46 AM  [name]  has presented today for surgery, with the diagnosis of aflutter.  The various methods of treatment have been discussed with the patient and family. After consideration of risks, benefits and other options for treatment, the patient has consented to  Procedure(s): CARDIOVERSION (N/A) as a surgical intervention.  The patient's history has been reviewed, patient examined, no change in status, stable for surgery.  I have reviewed the patient's chart and labs.  Questions were answered to the patient's satisfaction.     Shanavia Makela

## 2020-12-02 ENCOUNTER — Other Ambulatory Visit (HOSPITAL_COMMUNITY): Payer: Self-pay | Admitting: Internal Medicine

## 2020-12-04 LAB — ANA: Anti Nuclear Antibody (ANA): NEGATIVE

## 2020-12-04 LAB — MITOCHONDRIAL ANTIBODIES: Mitochondrial M2 Ab, IgG: <=20 U (ref <=20.0)

## 2020-12-04 LAB — ANTI-SMOOTH MUSCLE ANTIBODY, IGG: Actin (Smooth Muscle) Antibody (IGG): 28 U — ABNORMAL HIGH (ref <20)

## 2020-12-04 LAB — IGG: IgG (Immunoglobin G), Serum: 1262 mg/dL (ref 600–1540)

## 2020-12-05 ENCOUNTER — Ambulatory Visit (INDEPENDENT_AMBULATORY_CARE_PROVIDER_SITE_OTHER): Payer: Medicare HMO

## 2020-12-05 ENCOUNTER — Other Ambulatory Visit (HOSPITAL_COMMUNITY): Payer: Self-pay | Admitting: Cardiovascular Disease

## 2020-12-05 DIAGNOSIS — Z95810 Presence of automatic (implantable) cardiac defibrillator: Secondary | ICD-10-CM | POA: Diagnosis not present

## 2020-12-05 DIAGNOSIS — I5022 Chronic systolic (congestive) heart failure: Secondary | ICD-10-CM | POA: Diagnosis not present

## 2020-12-07 NOTE — Progress Notes (Addendum)
EPIC Encounter for ICM Monitoring  Patient Name: [name] is a 80 y.o. male Date: 12/07/2020 Primary Care Physican: [name], MD Primary Cardiologist: Kelly/Bensimhon Electrophysiologist: [name] BiV Pacing: 96.6%  11/02/2020 Weight: 132 lbs   Time in AT/AF  0.0 hr/day (0.0%) (on Coumadin)                                                  Spoke with patient and reports feeling well at this time.  Denies fluid symptoms.  Cardioversion completed 11/1.   Optivol thoracic impedance suggesting normal fluid levels.     Prescribed:   Torsemide 20 mg 1 tablet tree times a week Mon, Wed, Friday and Saturday.  Can take extra as needed Potassium 10 mEq take 0.5 tablet three times a week Mon, Wed & Friday.  Can take extra as needed Spironolactone 25 mg take 1 tablet daily   Labs: 11/27/2020 Creatinine 1.01, BUN 21, Potassium 4.2, Sodium 135 10/20/2020 Creatinine 1.02, BUN 21, Potassium 3.4, Sodium 135, GFR 69.16  10/06/2020 Creatinine 0.95, BUN 15, Potassium 4.3, Sodium 137  06/21/2020 Creatinine 0.86, BUN 17, Potassium 3.6, Sodium 134, GFr >6 0A complete set of results can be found in Results Review   Recommendations: No changes and encouraged to call if experiencing any fluid symptoms.   Follow-up plan: ICM clinic phone appointment on 01/09/2021.   91 day device clinic remote transmission 01/19/2021.     EP/Cardiology Office Visits:  Recall  06/23/2021 with Dr [name].      Copy of ICM check sent to Dr. [name].      3 month ICM trend: 12/05/2020.    1 Year ICM trend:       [name], RN 12/07/2020 4:30 PM

## 2020-12-11 ENCOUNTER — Telehealth: Payer: Self-pay | Admitting: Physician Assistant

## 2020-12-11 NOTE — Telephone Encounter (Addendum)
80 y.o. male with coronary artery disease, ischemic CM, (HFrEF) heart failure with reduced ejection fraction, atrial fibrillation on long term anticoagulation with warfarin.    He called the answering service with hematuria x 3 today. He has been having dysuria and his PCP placed him on antibiotics earlier in the week.  He notes some pain in his L pelvic region.  He had a DCCV for recurrent AFib on 11/1.  PLAN:  I explained to the patient that we really should not hold Warfarin b/c of his recent DCCV. I am concerned that he has had 3 episodes of hematuria today. He describes a lot blood.  He has some pain but this does not sound c/w with a renal stone.  He has been tx for a UTI. I have recommended that he go to the ED for evaluation. He is somewhat hesitant and wanted to speak to Dr. [name]. After further discussion, he agreed to go to the ED. [name], PA-C    12/11/2020 11:00 AM    ADDENDUM I called Dr. [name] and reviewed his case with him. He knows the pt very well.  After further review, we decided the patient does not need to go to the ED today.  He can be seen tomorrow at [facility].  I called the patient and spoke to him and his wife.  I advised them to contact [facility] in the morning.  I will send a message to our office to call in the morning as well.  I told the pt that if he starts to have weakness, syncope, fevers, disorientation or worsening bleeding, he should go to the ED. [name], PA-C    12/11/2020 11:17 AM

## 2020-12-12 ENCOUNTER — Encounter (HOSPITAL_COMMUNITY): Payer: Self-pay

## 2020-12-12 DIAGNOSIS — R310 Gross hematuria: Secondary | ICD-10-CM | POA: Diagnosis not present

## 2020-12-12 DIAGNOSIS — R3914 Feeling of incomplete bladder emptying: Secondary | ICD-10-CM | POA: Diagnosis not present

## 2020-12-12 DIAGNOSIS — N401 Enlarged prostate with lower urinary tract symptoms: Secondary | ICD-10-CM | POA: Diagnosis not present

## 2020-12-12 NOTE — Telephone Encounter (Signed)
Lvm this am to check on pt to see if pt called to get appt with [facility]. Stated pt did not have to call back.  Or if need be pt can send mychart message.

## 2020-12-12 NOTE — Telephone Encounter (Signed)
[name] returned call, she wanted to let our office know that the [name] did see the urologist on 12/11/20 and blood was withdrew from the [name]'s gallbladder. [name] and wife would like to Thank [name] for his wonderful assistance yesterday

## 2020-12-12 NOTE — Telephone Encounter (Signed)
S/w pt and spouse they were at [facility] waiting to be seen.

## 2020-12-14 ENCOUNTER — Telehealth: Payer: Self-pay

## 2020-12-14 DIAGNOSIS — R109 Unspecified abdominal pain: Secondary | ICD-10-CM

## 2020-12-14 DIAGNOSIS — R140 Abdominal distension (gaseous): Secondary | ICD-10-CM

## 2020-12-14 DIAGNOSIS — R933 Abnormal findings on diagnostic imaging of other parts of digestive tract: Secondary | ICD-10-CM

## 2020-12-14 DIAGNOSIS — K8020 Calculus of gallbladder without cholecystitis without obstruction: Secondary | ICD-10-CM

## 2020-12-14 DIAGNOSIS — R634 Abnormal weight loss: Secondary | ICD-10-CM

## 2020-12-14 NOTE — Telephone Encounter (Signed)
Patient has been scheduled for a CT scan at [place] on Tuesday, 01/03/21 at 3:40 pm. Pt will need to arrive at 3:20 pm. [address] location. Patient will need to pick up contrast and instructions several days prior to his appt. Pt will need to come in for labs on Friday, 12/30/20 for BUN/creatinine. Called and spoke with patient's wife in regards to appt information and instructions. She verbalized understanding of all information and had no concerns at the end of the call.  Lab order in epic.

## 2020-12-14 NOTE — Telephone Encounter (Signed)
-----   Message from Yevette Edwards, RN sent at 11/30/2020  9:41 AM EDT ----- Regarding: CT appt CT scan abd/pelvis with contrast - weight loss/bloating/gallstones/abdominal discomfort/abnormal imaging of the GI tract  Pt prefers to have done at [facility] [place], please enter the orders

## 2020-12-19 ENCOUNTER — Telehealth: Payer: Self-pay

## 2020-12-19 NOTE — Telephone Encounter (Signed)
Returned call to patient as requested.  He reports left ankle swelling and would like fluid levels checked.  Weight 123.6 lbs. Assisted in sending remote transmission.    12/19/2020 Optivol thoracic impedance suggesting possible fluid accumulation starting 11/7.      Recommended per prescription to take extra Torsemide 20 mg + Potassium 10 mEq on Tuesday and if swelling persist, repeat on Thursday.  Will recheck fluid levels 11/28.  He and wife verbalized understanding.

## 2020-12-19 NOTE — Telephone Encounter (Signed)
Incoming call.  Patient states his left ankle is swollen.  Requesting to speak with laurie -- "she manages my fluid levels"

## 2020-12-20 DIAGNOSIS — R310 Gross hematuria: Secondary | ICD-10-CM | POA: Diagnosis not present

## 2020-12-20 DIAGNOSIS — N323 Diverticulum of bladder: Secondary | ICD-10-CM | POA: Diagnosis not present

## 2020-12-20 DIAGNOSIS — N401 Enlarged prostate with lower urinary tract symptoms: Secondary | ICD-10-CM | POA: Diagnosis not present

## 2020-12-20 DIAGNOSIS — R3914 Feeling of incomplete bladder emptying: Secondary | ICD-10-CM | POA: Diagnosis not present

## 2020-12-21 ENCOUNTER — Other Ambulatory Visit: Payer: Self-pay

## 2020-12-21 ENCOUNTER — Ambulatory Visit (INDEPENDENT_AMBULATORY_CARE_PROVIDER_SITE_OTHER): Payer: Medicare HMO

## 2020-12-21 DIAGNOSIS — Z5181 Encounter for therapeutic drug level monitoring: Secondary | ICD-10-CM

## 2020-12-21 LAB — POCT INR: INR: 2 (ref 2.0–3.0)

## 2020-12-21 NOTE — Patient Instructions (Signed)
Continue 0.5 tablet daily except 1 tablet each Monday, Wednesday and Friday.  Repeat INR in 6 weeks

## 2020-12-26 ENCOUNTER — Ambulatory Visit (INDEPENDENT_AMBULATORY_CARE_PROVIDER_SITE_OTHER): Payer: Medicare HMO

## 2020-12-26 DIAGNOSIS — I5022 Chronic systolic (congestive) heart failure: Secondary | ICD-10-CM

## 2020-12-26 DIAGNOSIS — Z95810 Presence of automatic (implantable) cardiac defibrillator: Secondary | ICD-10-CM

## 2020-12-27 NOTE — Progress Notes (Signed)
EPIC Encounter for ICM Monitoring  Patient Name: [name] is a 80 y.o. male Date: 12/27/2020 Primary Care Physican: [name], MD Primary Care Physican: [name], MD Primary Cardiologist: Kelly/Bensimhon Electrophysiologist: [name] BiV Pacing: 97.1%  11/02/2020 Weight: 132 lbs   Time in AT/AF  0.0 hr/day (0.0%) (on Coumadin)                                                  Transmission reviewed.   Optivol thoracic impedance suggesting normal fluid levels.     Prescribed:   Torsemide 20 mg 1 tablet tree times a week Mon, Wed, Friday and Saturday.  Can take extra as needed Potassium 10 mEq take 0.5 tablet three times a week Mon, Wed & Friday.  Can take extra as needed Spironolactone 25 mg take 1 tablet daily   Labs: 11/27/2020 Creatinine 1.01, BUN 21, Potassium 4.2, Sodium 135 10/20/2020 Creatinine 1.02, BUN 21, Potassium 3.4, Sodium 135, GFR 69.16  10/06/2020 Creatinine 0.95, BUN 15, Potassium 4.3, Sodium 137  06/21/2020 Creatinine 0.86, BUN 17, Potassium 3.6, Sodium 134, GFr >6 0A complete set of results can be found in Results Review   Recommendations: No changes.   Follow-up plan: ICM clinic phone appointment on 01/09/2021.   91 day device clinic remote transmission 01/19/2021.     EP/Cardiology Office Visits:  Recall  06/23/2021 with Dr [name].      Copy of ICM check sent to Dr. [name].      3 month ICM trend: 12/26/2020.    12-14 Month ICM trend:       [name], RN 12/27/2020 1:21 PM

## 2020-12-30 ENCOUNTER — Other Ambulatory Visit: Payer: Medicare HMO

## 2020-12-30 DIAGNOSIS — R933 Abnormal findings on diagnostic imaging of other parts of digestive tract: Secondary | ICD-10-CM | POA: Diagnosis not present

## 2020-12-30 DIAGNOSIS — R140 Abdominal distension (gaseous): Secondary | ICD-10-CM | POA: Diagnosis not present

## 2020-12-30 DIAGNOSIS — R634 Abnormal weight loss: Secondary | ICD-10-CM

## 2020-12-30 DIAGNOSIS — K8020 Calculus of gallbladder without cholecystitis without obstruction: Secondary | ICD-10-CM

## 2020-12-30 DIAGNOSIS — R109 Unspecified abdominal pain: Secondary | ICD-10-CM

## 2020-12-31 LAB — BUN/CREATININE RATIO
BUN: 15 mg/dL (ref 7–25)
Creat: 0.82 mg/dL (ref 0.70–1.22)
eGFR: 88 mL/min/{1.73_m2} (ref >=60)

## 2021-01-03 ENCOUNTER — Ambulatory Visit
Admission: RE | Admit: 2021-01-03 | Discharge: 2021-01-03 | Disposition: A | Discharge status: Home / Self Care | Payer: Medicare HMO | Source: Ambulatory Visit | Attending: Gastroenterology | Admitting: Gastroenterology

## 2021-01-03 DIAGNOSIS — K8020 Calculus of gallbladder without cholecystitis without obstruction: Secondary | ICD-10-CM

## 2021-01-03 DIAGNOSIS — R634 Abnormal weight loss: Secondary | ICD-10-CM | POA: Diagnosis not present

## 2021-01-03 DIAGNOSIS — R3914 Feeling of incomplete bladder emptying: Secondary | ICD-10-CM | POA: Diagnosis not present

## 2021-01-03 DIAGNOSIS — K5730 Diverticulosis of large intestine without perforation or abscess without bleeding: Secondary | ICD-10-CM | POA: Diagnosis not present

## 2021-01-03 DIAGNOSIS — G8929 Other chronic pain: Secondary | ICD-10-CM | POA: Diagnosis not present

## 2021-01-03 DIAGNOSIS — N400 Enlarged prostate without lower urinary tract symptoms: Secondary | ICD-10-CM | POA: Diagnosis not present

## 2021-01-03 DIAGNOSIS — R140 Abdominal distension (gaseous): Secondary | ICD-10-CM

## 2021-01-03 DIAGNOSIS — R109 Unspecified abdominal pain: Secondary | ICD-10-CM

## 2021-01-03 DIAGNOSIS — R933 Abnormal findings on diagnostic imaging of other parts of digestive tract: Secondary | ICD-10-CM

## 2021-01-03 MED ORDER — IOPAMIDOL (ISOVUE-300) INJECTION 61%
100.0000 mL | Freq: Once | INTRAVENOUS | Status: AC | PRN
  Administered 2021-01-03: 100 mL via INTRAVENOUS

## 2021-01-05 ENCOUNTER — Ambulatory Visit (INDEPENDENT_AMBULATORY_CARE_PROVIDER_SITE_OTHER): Payer: Medicare HMO | Admitting: Gastroenterology

## 2021-01-05 ENCOUNTER — Encounter: Payer: Self-pay | Admitting: Gastroenterology

## 2021-01-05 VITALS — BP 90/44 | HR 68 | Ht 65.0 in | Wt 127.0 lb

## 2021-01-05 DIAGNOSIS — K8020 Calculus of gallbladder without cholecystitis without obstruction: Secondary | ICD-10-CM

## 2021-01-05 DIAGNOSIS — R748 Abnormal levels of other serum enzymes: Secondary | ICD-10-CM | POA: Diagnosis not present

## 2021-01-05 DIAGNOSIS — R933 Abnormal findings on diagnostic imaging of other parts of digestive tract: Secondary | ICD-10-CM

## 2021-01-05 DIAGNOSIS — R109 Unspecified abdominal pain: Secondary | ICD-10-CM

## 2021-01-05 NOTE — Patient Instructions (Signed)
If you are age 81 or older, your body mass index should be between 23-30. Your Body mass index is 21.13 kg/m. If this is out of the aforementioned range listed, please consider follow up with your Primary Care Provider.  If you are age 27 or younger, your body mass index should be between 19-25. Your Body mass index is 21.13 kg/m. If this is out of the aformentioned range listed, please consider follow up with your Primary Care Provider.   ________________________________________________________  The [facility] providers would like to encourage you to use [facility] to communicate with providers for non-urgent requests or questions.  Due to long hold times on the telephone, sending your provider a message by [facility] may be a faster and more efficient way to get a response.  Please allow 48 business hours for a response.  Please remember that this is for non-urgent requests.  _______________________________________________________  Please go to the lab in the basement of our building to have lab work done as you leave today. Hit "B" for basement when you get on the elevator.  When the doors open the lab is on your left.  We will call you with the results. Thank you.  Thank you for entrusting me with your care and for choosing [facility], Dr. Ponderay Cellar

## 2021-01-05 NOTE — Progress Notes (Signed)
HPI :  80 year old male here for follow-up visit for abdominal bloating / discomfort, weight loss  Recall that he has a history of CAD, MI 1992, CHF with LVEF 20%, ischemic cardiomyopathy, severe MR, AICD, LBBB, s/p CABG 1998 and atrial fibrillation on Coumadin.   He was seen on November 1, see details of that note for more recent events. Recall he had a prior evaluation for intermittent upper abdominal pain. RUQ sonogram  06/01/2020 which showed a distended gallbladder with gallstones and a probable stone in the cystic duct vs a possible 92m gallbladder polyps verses an adherent stone. Mild diffuse gallbladder wall thickening was noted.  A CCK HIDA scan was recommended, however, the patient declined to complete this study.  He was evaluated by general surgery and the patient did not wish to pursue a cholecystectomy due to concern for his surgical risks given his cardiomyopathy.  He had followed up for ongoing intermittent abdominal discomfort in his upper abdomen. CT scan by CCarl Bestdone on October 5. Multiple findings to include suspected moderate to large pleural effusion with atelectasis.  He had some mild perihepatic ascites with infiltrative stranding of the mesentery porta hepatis and peripancreatic region.  Unclear if this was third spacing from an underlying inflammatory process such as duodenitis or groove pancreatitis.  Also noted to have some gastric wall thickening that is nonspecific and of unclear significance, although the stomach was not well distended.  He had follow-up labs which showed an elevated lipase to the high 100s.  He was eating well without any nausea or vomiting.  He had a mild elevation of alk phos and bilirubin over the past year.   After our last visit we repeated his lipase and had decreased to 64.  GGT was elevated confirming alk phos hepatic in etiology.  He had autoimmune labs for elevated alk phos which were negative.  Had discussed his case with Dr.  BHaroldine Lawsof cardiology in regards to whether or not the patient was stable for an upper endoscopy to evaluate CT findings and his symptoms.  The patient had a cardioversion at the hospital for arrhythmia by Dr. BHaroldine Laws with only 40 mg of propofol he had hypotension and a prolonged recovery.  Dr. BHaroldine Lawsdid not think he was a good candidate for an EGD right now given his stage heart failure and is not a candidate for more advanced therapies with an overall poor prognosis.  In this light we continued his Protonix and Bentyl as those measures have helped.  We repeated a CT scan to reassess some of these abnormalities which was done on 12 6.  Report as below.  His pancreas looks normal, there is no abnormality of his stomach that I can see.  The main new finding appears to be an enlarged prostate with some asymmetry, this incidentally was not noted on his last exam in September.  He has stigmata associated with heart failure.  Generally since of last seen him he states he is actually feeling a bit better.  He is not complaining of much of any abdominal pain at this point time.  He is eating better and has gained a few pounds.  He has been taking Bentyl as needed for his baseline abdominal cramps which helps.  He has been on Protonix twice daily without any reflux symptoms.  He has an inguinal hernia he inquires about and also again wants to document his gallstones.  Of note he is been seeing urology for problems with  voiding.  He has a catheter in place.  States he has been seen by urology just yesterday, they were not aware of the CT scan results.  He is due to see them again in 2 weeks or so.   Recent workup: CT abdomen / pelvis 01/03/21: IMPRESSION: Enlarged prostate with asymmetric soft tissue bulge along the right mid gland and base, with involvement of the right inferior bladder wall. Although not significant changed since prior study, this raises suspicion for prostate carcinoma. Recommend  correlation with serum PSA, and consider prostate MRI for further evaluation if clinically warranted.   No evidence of abdominal or pelvic metastatic disease.   Stable large left posterior bladder diverticulum.   Colonic diverticulosis. No radiographic evidence of diverticulitis.   Stable severe cardiomegaly and findings of right heart insufficiency.   Stable moderate right pleural effusion and right lower lobe atelectasis.   CT abdomen / pelvis with contrast 11/02/20:  IMPRESSION: 1. Substantial cardiomegaly. Reflux of contrast into the hepatic veins, with initial imaging of the abdomen in the early arterial phase likely due to reduced cardiac output substantially affecting standard timing of bolus. 2. Moderate to large right pleural effusion with atelectasis in the right lower lobe. 3. Mild perihepatic ascites along with infiltrative stranding in the central mesentery, porta hepatis, peripancreatic region. This could be from third spacing of fluid although inflammatory processes such as duodenitis or groove pancreatitis could also contribute to the appearance of upper abdominal infiltrative edema. 4. Other imaging findings of potential clinical significance: Lateral gastric body wall thickening unchanged from 03/05/2018, potentially mostly from nondistention although gastritis cannot be excluded. Left renal cyst with probably mildly complex right renal cyst. Large posterior diverticulum of the urinary bladder. Mild colonic diverticulosis. Aortic Atherosclerosis (ICD10-I70.0). Mild prostatomegaly eccentric to the right side. Speckled calcifications along the tunica albuginea in the penis may represent early Peyronie's disease. Spondylosis and degenerative disc disease at L5-S1. Small chronic left inguinal hernia containing trace fluid, an unremarkable knuckle of small bowel, and adipose tissue.   CT chest 11/08/20 -  IMPRESSION: 1. Right pleural effusion, volume estimated  500 cc. 2. Marked cardiomegaly without pericardial effusion. 3.  Aortic Atherosclerosis (ICD10-I70.0).     Past Medical History:  Diagnosis Date   Acute on chronic systolic CHF (congestive heart failure) ([place]) 06/09/2015   Automatic implantable cardiac defibrillator in situ 2002; 2010   medtronic virtuso   Cardiomyopathy, ischemic 2011   with EF 25-35% by echo   Coronary artery disease    Hx MI 1992, CABG 1998 , Nuc study 11.2013 large scar but no ischemia   GERD (gastroesophageal reflux disease)    H/O myocardial infarction, greater than 8 weeks 1992   large ant wall injury   NSVT (nonsustained ventricular tachycardia)    Paroxysmal atrial fibrillation [facility])      Past Surgical History:  Procedure Laterality Date   BIOPSY  11/04/2018   Procedure: BIOPSY;  Surgeon: Yetta Flock, MD;  Location: [facility];  Service: Gastroenterology;;   Zollie Beckers N/A 09/10/2016   Procedure: BiV ICD Upgrade;  Surgeon: Deboraha Sprang, MD;  Location: [place] CV LAB;  Service: Cardiovascular;  Laterality: N/A;   CARDIOVERSION N/A 11/29/2020   Procedure: CARDIOVERSION;  Surgeon: Jolaine Artist, MD;  Location: [facility] ENDOSCOPY;  Service: Cardiovascular;  Laterality: N/A;   CATARACT EXTRACTION, BILATERAL     COLONOSCOPY WITH PROPOFOL N/A 11/04/2018   Procedure: COLONOSCOPY WITH PROPOFOL;  Surgeon: Yetta Flock, MD;  Location: [facility] ENDOSCOPY;  Service: Gastroenterology;  Laterality: N/A;   CORONARY ARTERY BYPASS GRAFT  1998   x 5   HERNIA REPAIR  1960's   right inguinal   ICD GENERATOR CHANGE  01/2008, 2018   medtronic, hx+ EP study   KNEE SURGERY  1960s   right, acl repair    NASAL SINUS SURGERY  05/31/2010   Dr. Benjamine Mola   POLYPECTOMY  11/04/2018   Procedure: POLYPECTOMY;  Surgeon: Yetta Flock, MD;  Location: [facility] ENDOSCOPY;  Service: Gastroenterology;;   TEE WITHOUT CARDIOVERSION N/A 07/21/2019   Procedure: TRANSESOPHAGEAL ECHOCARDIOGRAM (TEE);  Surgeon: Jolaine Artist,  MD;  Location: [facility] ENDOSCOPY;  Service: Cardiovascular;  Laterality: N/A;   UPPER GI ENDOSCOPY     Family History  Problem Relation Age of Onset   Heart disease Father        questionable   Stroke Mother    Heart failure Sister    Healthy Brother    Healthy Sister    Parkinsonism Brother    Colon cancer Neg Hx    Social History   Tobacco Use   Smoking status: Never   Smokeless tobacco: Never  Vaping Use   Vaping Use: Never used  Substance Use Topics   Alcohol use: Yes    Alcohol/week: 2.0 standard drinks    Types: 2 Glasses of wine per week    Comment: socially   Drug use: No   Current Outpatient Medications  Medication Sig Dispense Refill   acetaminophen (TYLENOL) 650 MG CR tablet Take 650 mg by mouth 2 (two) times daily.      allopurinol (ZYLOPRIM) 300 MG tablet Take 300 mg by mouth daily.     benzonatate (TESSALON) 100 MG capsule Take 100 mg by mouth 3 (three) times daily as needed for cough.     bisoprolol (ZEBETA) 5 MG tablet TAKE 1/2 TABLET EVERY DAY 45 tablet 3   cetirizine (ZYRTEC) 10 MG tablet Take 10 mg by mouth daily.     cholecalciferol (VITAMIN D3) 25 MCG (1000 UNIT) tablet Take 1,000 Units by mouth daily.     dapagliflozin propanediol (FARXIGA) 10 MG TABS tablet Take 1 tablet (10 mg total) by mouth daily before breakfast. 90 tablet 3   dicyclomine (BENTYL) 10 MG capsule TAKE 1 CAPSULE EVERY 8 HOURS AS NEEDED FOR SPASM(S) 180 capsule 1   doxycycline (VIBRA-TABS) 100 MG tablet Take 100 mg by mouth 2 (two) times daily.     isosorbide mononitrate (IMDUR) 30 MG 24 hr tablet Take 0.5 tablets (15 mg total) by mouth daily. 45 tablet 0   LORazepam (ATIVAN) 1 MG tablet Take 0.25-0.5 mg by mouth at bedtime.     losartan (COZAAR) 25 MG tablet Take 0.5 tablets (12.5 mg total) by mouth 2 (two) times daily. 180 tablet 3   magnesium oxide (MAG-OX) 400 MG tablet Take 400 mg by mouth daily.     mometasone (ASMANEX) 220 MCG/INH inhaler Inhale 2 puffs into the lungs daily as  needed (asthma).     montelukast (SINGULAIR) 10 MG tablet Take 1 tablet (10 mg total) by mouth at bedtime. PLEASE SCHEDULE APPOINTMENT. 30 tablet 0   pantoprazole (PROTONIX) 20 MG tablet TAKE 1 TABLET TWICE DAILY 180 tablet 1   Polyethyl Glycol-Propyl Glycol (SYSTANE OP) Place 2-3 drops into both eyes 3 (three) times daily as needed (dry eyes).     potassium chloride (KLOR-CON) 10 MEQ tablet TAKE 1/2 TABLET ON MONDAY, WEDNESDAY AND FRIDAY . CAN TAKE EXTRA AS NEEDED 45 tablet 3  Psyllium (METAMUCIL) 48.57 % POWD Take 1 Dose by mouth daily as needed (regularity).     simvastatin (ZOCOR) 40 MG tablet TAKE 1 TABLET EVERY DAY (NEED MD APPOINTMENT FOR REFILLS) 90 tablet 3   spironolactone (ALDACTONE) 25 MG tablet TAKE 1 TABLET EVERY DAY 90 tablet 3   tamsulosin (FLOMAX) 0.4 MG CAPS capsule Take 0.4 mg by mouth daily.     torsemide (DEMADEX) 20 MG tablet TAKE 1 TABLET BY MOUTH AS DIRECTED ON MONDAY, WEDNESDAY, FRIDAY AND SATURDAY 52 tablet 2   Vitamins-Lipotropics (LIPO FLAVONOID PLUS) TABS Take 3 tablets by mouth daily in the afternoon.     warfarin (COUMADIN) 5 MG tablet TAKE 1/2 TABLET TO 1 TABLET BY MOUTH DAILY AS DIRECTED BY THE COUMADIN CLINIC . 60 tablet 1   No current facility-administered medications for this visit.   Allergies  Allergen Reactions   Amoxicillin Rash and Other (See Comments)    Has patient had a PCN reaction causing immediate rash, facial/tongue/throat swelling, SOB or lightheadedness with hypotension: Yes Has patient had a PCN reaction causing severe rash involving mucus membranes or skin necrosis: No Has patient had a PCN reaction that required hospitalization: No Has patient had a PCN reaction occurring within the last 10 years: No If all of the above answers are "NO", then may proceed with Cephalosporin use.    Penicillins Rash    Has patient had a PCN reaction causing immediate rash, facial/tongue/throat swelling, SOB or lightheadedness with hypotension: Yes Has  patient had a PCN reaction causing severe rash involving mucus membranes or skin necrosis: No Has patient had a PCN reaction that required hospitalization: No Has patient had a PCN reaction occurring within the last 10 years: No If all of the above answers are "NO", then may proceed with Cephalosporin use.      Review of Systems: All systems reviewed and negative except where noted in HPI.    CT Abdomen Pelvis W Contrast  Result Date: 01/04/2021 CLINICAL DATA:  Chronic abdominal pain and bloating for 3 years. Unexplained weight loss. EXAM: CT ABDOMEN AND PELVIS WITH CONTRAST TECHNIQUE: Multidetector CT imaging of the abdomen and pelvis was performed using the standard protocol following bolus administration of intravenous contrast. CONTRAST:  156m ISOVUE-300 IOPAMIDOL (ISOVUE-300) INJECTION 61% COMPARISON:  10/27/2020 FINDINGS: Lower Chest: Stable severe cardiomegaly. Reflux of contrast into the IVC and hepatic veins is again seen, consistent right heart insufficiency. Moderate right pleural effusion and right lower lobe atelectasis show no significant change. Hepatobiliary: No hepatic masses identified. Gallbladder is unremarkable. No evidence of biliary ductal dilatation. Pancreas:  No mass or inflammatory changes. Spleen: Within normal limits in size and appearance. Adrenals/Urinary Tract: No masses identified. Small bilateral renal cysts again noted. No evidence of ureteral calculi or hydronephrosis. Foley catheter is seen within the urinary bladder which is decompressed. A large left posterior bladder diverticulum measuring approximately 7 cm remained stable. Stomach/Bowel: No evidence of obstruction, inflammatory process or abnormal fluid collections. Left-sided colonic diverticulosis is again seen, however there is no evidence of diverticulitis. Vascular/Lymphatic: No pathologically enlarged lymph nodes. No acute vascular findings. Reproductive: Prostate remains enlarged, with asymmetric  irregular soft tissue bulge along the right mid gland and base with involvement of the right inferior bladder wall. This appears similar to prior study, and is suspicious for prostate carcinoma with extracapsular extension. Other:  None. Musculoskeletal:  No suspicious bone lesions identified. IMPRESSION: Enlarged prostate with asymmetric soft tissue bulge along the right mid gland and base, with involvement of  the right inferior bladder wall. Although not significant changed since prior study, this raises suspicion for prostate carcinoma. Recommend correlation with serum PSA, and consider prostate MRI for further evaluation if clinically warranted. No evidence of abdominal or pelvic metastatic disease. Stable large left posterior bladder diverticulum. Colonic diverticulosis. No radiographic evidence of diverticulitis. Stable severe cardiomegaly and findings of right heart insufficiency. Stable moderate right pleural effusion and right lower lobe atelectasis. Electronically Signed   By: Marlaine Hind M.D.   On: 01/04/2021 14:49    Lab Results  Component Value Date   WBC 9.2 11/27/2020   HGB 15.6 11/27/2020   HCT 48.6 11/27/2020   MCV 92.6 11/27/2020   PLT 334 11/27/2020   Lab Results  Component Value Date   CREATININE 0.82 12/30/2020   BUN 15 12/30/2020   NA 135 11/27/2020   K 4.2 11/27/2020   CL 100 11/27/2020   CO2 26 11/27/2020    Lab Results  Component Value Date   ALT 20 11/27/2020   AST 26 11/27/2020   ALKPHOS 163 (H) 11/27/2020   BILITOT 1.3 (H) 11/27/2020     Physical Exam: BP (!) 90/44   Pulse 68   Ht _0  (1.651 m)   Wt 127 lb (57.6 kg)   SpO2 97%   BMI 21.13 kg/m  Constitutional: Pleasant,well-developed, male in no acute distress. Neurological: Alert and oriented to person place and time. Psychiatric: Normal mood and affect. Behavior is normal.   ASSESSMENT AND PLAN: 80 year old male here for reassessment of the following:  Abnormal imaging of the GI tract /  prostate Abdominal discomfort Gallstones Elevated alk phos  As above, patient well-known to me for history of longstanding IBS.  He has had some intermittent abdominal discomfort in his upper abdomen that comes and goes.  He does have gallstones but we have not pursued cholecystectomy given he is high risk for surgery with end-stage heart failure.  There was also some questioning of thickening of his stomach on his last CT scan and question of peripancreatic inflammatory changes, question pancreatitis.  I have discussed his case with his cardiologist and he is not a candidate for any elective anesthesia at this point in time due to his end-stage heart failure.  I do not think he would tolerate an EGD well at all.  We have held off on that and repeated a CT scan which shows that his pancreas is normal and his stomach appears normal as well which is reassuring.  Incidentally noted his prostate is asymmetric and appears abnormal.  I will contact his urologist so he is aware of this to see if related overall to his current bladder issues and he will follow up with them for this issue.  He again inquired about gallstones and his inguinal hernia.  Again we discussed that we are not pursuing elective operations at this point time given his high risk for anesthesia and that he is feeling better.  He is gained weight and is eating much better since of last seen him.  We are going to continue his Protonix and Bentyl and monitor.  I reviewed his elevated alk phos with him which is mild with normal transaminitis.  He has had a serologic work-up for this and his liver appears normal on imaging.  I do not think this is anything to worry about given his comorbidities but will trend with follow-up labs next month.  He and wife agree with plan, all questions answered.  Jolly Mango, MD [facility]

## 2021-01-09 ENCOUNTER — Ambulatory Visit (INDEPENDENT_AMBULATORY_CARE_PROVIDER_SITE_OTHER): Payer: Medicare HMO

## 2021-01-09 DIAGNOSIS — I5022 Chronic systolic (congestive) heart failure: Secondary | ICD-10-CM

## 2021-01-09 DIAGNOSIS — Z95810 Presence of automatic (implantable) cardiac defibrillator: Secondary | ICD-10-CM

## 2021-01-11 ENCOUNTER — Telehealth: Payer: Self-pay

## 2021-01-11 DIAGNOSIS — N323 Diverticulum of bladder: Secondary | ICD-10-CM | POA: Diagnosis not present

## 2021-01-11 DIAGNOSIS — N401 Enlarged prostate with lower urinary tract symptoms: Secondary | ICD-10-CM | POA: Diagnosis not present

## 2021-01-11 DIAGNOSIS — R310 Gross hematuria: Secondary | ICD-10-CM | POA: Diagnosis not present

## 2021-01-11 DIAGNOSIS — R972 Elevated prostate specific antigen [PSA]: Secondary | ICD-10-CM | POA: Diagnosis not present

## 2021-01-11 DIAGNOSIS — R3914 Feeling of incomplete bladder emptying: Secondary | ICD-10-CM | POA: Diagnosis not present

## 2021-01-11 NOTE — Progress Notes (Signed)
EPIC Encounter for ICM Monitoring  Patient Name: [name] is a 80 y.o. male Date: 01/11/2021 Primary Care Physican: [name], MD Electrophysiologist: [name] BiV Pacing: 97.2%  11/02/2020 Weight: 132 lbs   Time in AT/AF  0.0 hr/day (0.0%) (on Coumadin)                                                  Attempted call to patient and unable to reach.  Left message to return call. Transmission reviewed.    Optivol thoracic impedance suggesting normal fluid levels.     Prescribed:   Torsemide 20 mg 1 tablet tree times a week Mon, Wed, Friday and Saturday.  Can take extra as needed Potassium 10 mEq take 0.5 tablet three times a week Mon, Wed & Friday.  Can take extra as needed Spironolactone 25 mg take 1 tablet daily   Labs: 11/27/2020 Creatinine 1.01, BUN 21, Potassium 4.2, Sodium 135 10/20/2020 Creatinine 1.02, BUN 21, Potassium 3.4, Sodium 135, GFR 69.16  10/06/2020 Creatinine 0.95, BUN 15, Potassium 4.3, Sodium 137  06/21/2020 Creatinine 0.86, BUN 17, Potassium 3.6, Sodium 134, GFr >6 0A complete set of results can be found in Results Review   Recommendations: Unable to reach.     Follow-up plan: ICM clinic phone appointment on 02/13/2021.   91 day device clinic remote transmission 01/19/2021.     EP/Cardiology Office Visits:  Recall  06/23/2021 with Dr [name].      Copy of ICM check sent to Dr. [name].      3 month ICM trend: 01/09/2021.    12-14 Month ICM trend:       [name], RN 01/11/2021 2:47 PM

## 2021-01-11 NOTE — Telephone Encounter (Signed)
Remote ICM transmission received.  Attempted call to patient regarding ICM remote transmission and left message per DPR to return call.   

## 2021-01-12 DIAGNOSIS — R3914 Feeling of incomplete bladder emptying: Secondary | ICD-10-CM | POA: Diagnosis not present

## 2021-01-13 NOTE — Progress Notes (Signed)
Returned patient call and wife reported he was sleeping.  Will attempt call back.

## 2021-01-13 NOTE — Progress Notes (Signed)
Spoke with patient and heart failure questions reviewed.  Pt asymptomatic for fluid accumulation.  He has urinary catheter that will be permanent due to prostate problems. No changes and encouraged to call if experiencing any fluid symptoms.

## 2021-01-19 ENCOUNTER — Ambulatory Visit (INDEPENDENT_AMBULATORY_CARE_PROVIDER_SITE_OTHER): Payer: Medicare HMO

## 2021-01-19 DIAGNOSIS — I255 Ischemic cardiomyopathy: Secondary | ICD-10-CM | POA: Diagnosis not present

## 2021-01-19 LAB — CUP PACEART REMOTE DEVICE CHECK
Battery Remaining Longevity: 28 mo
Battery Voltage: 2.95 V
Brady Statistic AP VP Percent: 51.76 %
Brady Statistic AP VS Percent: 0.88 %
Brady Statistic AS VP Percent: 46.28 %
Brady Statistic AS VS Percent: 1.08 %
Brady Statistic RA Percent Paced: 51.95 %
Brady Statistic RV Percent Paced: 0.07 %
Date Time Interrogation Session: 20221222001805
HighPow Impedance: 42 Ohm
HighPow Impedance: 75 Ohm
Implantable Lead Implant Date: 20010806
Implantable Lead Implant Date: 20180813
Implantable Lead Implant Date: 20180813
Implantable Lead Location: 753858
Implantable Lead Location: 753859
Implantable Lead Location: 753860
Implantable Lead Model: 147
Implantable Lead Model: 5092
Implantable Lead Serial Number: 104581
Implantable Pulse Generator Implant Date: 20180813
Lead Channel Impedance Value: 159.265
Lead Channel Impedance Value: 166.25 Ohm
Lead Channel Impedance Value: 169.459
Lead Channel Impedance Value: 193.707
Lead Channel Impedance Value: 204.14 Ohm
Lead Channel Impedance Value: 285 Ohm
Lead Channel Impedance Value: 361 Ohm
Lead Channel Impedance Value: 399 Ohm
Lead Channel Impedance Value: 418 Ohm
Lead Channel Impedance Value: 418 Ohm
Lead Channel Impedance Value: 551 Ohm
Lead Channel Impedance Value: 589 Ohm
Lead Channel Impedance Value: 608 Ohm
Lead Channel Impedance Value: 608 Ohm
Lead Channel Impedance Value: 646 Ohm
Lead Channel Impedance Value: 703 Ohm
Lead Channel Impedance Value: 703 Ohm
Lead Channel Impedance Value: 722 Ohm
Lead Channel Pacing Threshold Amplitude: 0.5 V
Lead Channel Pacing Threshold Amplitude: 1.625 V
Lead Channel Pacing Threshold Amplitude: 1.625 V
Lead Channel Pacing Threshold Pulse Width: 0.4 ms
Lead Channel Pacing Threshold Pulse Width: 0.4 ms
Lead Channel Pacing Threshold Pulse Width: 0.4 ms
Lead Channel Sensing Intrinsic Amplitude: 1.875 mV
Lead Channel Sensing Intrinsic Amplitude: 1.875 mV
Lead Channel Sensing Intrinsic Amplitude: 13.25 mV
Lead Channel Sensing Intrinsic Amplitude: 13.25 mV
Lead Channel Setting Pacing Amplitude: 1.75 V
Lead Channel Setting Pacing Amplitude: 2 V
Lead Channel Setting Pacing Amplitude: 3.25 V
Lead Channel Setting Pacing Pulse Width: 0.4 ms
Lead Channel Setting Pacing Pulse Width: 0.4 ms
Lead Channel Setting Sensing Sensitivity: 0.3 mV

## 2021-01-31 NOTE — Progress Notes (Signed)
Remote ICD transmission.   

## 2021-02-01 ENCOUNTER — Ambulatory Visit (INDEPENDENT_AMBULATORY_CARE_PROVIDER_SITE_OTHER): Payer: Medicare HMO

## 2021-02-01 ENCOUNTER — Other Ambulatory Visit: Payer: Self-pay

## 2021-02-01 DIAGNOSIS — Z5181 Encounter for therapeutic drug level monitoring: Secondary | ICD-10-CM | POA: Diagnosis not present

## 2021-02-01 LAB — POCT INR: INR: 2.8 (ref 2.0–3.0)

## 2021-02-01 NOTE — Patient Instructions (Signed)
Continue 0.5 tablet daily except 1 tablet each Monday, Wednesday and Friday.  Repeat INR in 6 weeks

## 2021-02-02 DIAGNOSIS — E7800 Pure hypercholesterolemia, unspecified: Secondary | ICD-10-CM | POA: Diagnosis not present

## 2021-02-02 DIAGNOSIS — H9313 Tinnitus, bilateral: Secondary | ICD-10-CM | POA: Diagnosis not present

## 2021-02-02 DIAGNOSIS — I5022 Chronic systolic (congestive) heart failure: Secondary | ICD-10-CM | POA: Diagnosis not present

## 2021-02-02 DIAGNOSIS — Z0001 Encounter for general adult medical examination with abnormal findings: Secondary | ICD-10-CM | POA: Diagnosis not present

## 2021-02-02 DIAGNOSIS — F419 Anxiety disorder, unspecified: Secondary | ICD-10-CM | POA: Diagnosis not present

## 2021-02-02 DIAGNOSIS — Z95810 Presence of automatic (implantable) cardiac defibrillator: Secondary | ICD-10-CM | POA: Diagnosis not present

## 2021-02-07 ENCOUNTER — Other Ambulatory Visit (HOSPITAL_COMMUNITY): Payer: Self-pay | Admitting: *Deleted

## 2021-02-07 MED ORDER — DAPAGLIFLOZIN PROPANEDIOL 10 MG PO TABS: 10.0000 mg | ORAL_TABLET | Freq: Every day | ORAL | 3 refills | Status: AC

## 2021-02-08 ENCOUNTER — Encounter (HOSPITAL_COMMUNITY): Payer: Medicare HMO | Admitting: Internal Medicine

## 2021-02-13 ENCOUNTER — Ambulatory Visit (INDEPENDENT_AMBULATORY_CARE_PROVIDER_SITE_OTHER): Payer: Medicare HMO

## 2021-02-13 DIAGNOSIS — Z95810 Presence of automatic (implantable) cardiac defibrillator: Secondary | ICD-10-CM

## 2021-02-13 DIAGNOSIS — I5022 Chronic systolic (congestive) heart failure: Secondary | ICD-10-CM | POA: Diagnosis not present

## 2021-02-13 DIAGNOSIS — R3914 Feeling of incomplete bladder emptying: Secondary | ICD-10-CM | POA: Diagnosis not present

## 2021-02-15 DIAGNOSIS — M1120 Other chondrocalcinosis, unspecified site: Secondary | ICD-10-CM | POA: Diagnosis not present

## 2021-02-15 DIAGNOSIS — M109 Gout, unspecified: Secondary | ICD-10-CM | POA: Diagnosis not present

## 2021-02-15 DIAGNOSIS — Z79899 Other long term (current) drug therapy: Secondary | ICD-10-CM | POA: Diagnosis not present

## 2021-02-15 DIAGNOSIS — M1990 Unspecified osteoarthritis, unspecified site: Secondary | ICD-10-CM | POA: Diagnosis not present

## 2021-02-15 DIAGNOSIS — M25561 Pain in right knee: Secondary | ICD-10-CM | POA: Diagnosis not present

## 2021-02-15 DIAGNOSIS — I509 Heart failure, unspecified: Secondary | ICD-10-CM | POA: Diagnosis not present

## 2021-02-15 NOTE — Progress Notes (Signed)
EPIC Encounter for ICM Monitoring  Patient Name: [name] is a 81 y.o. male Date: 02/15/2021 Primary Care Physican: [name], MD Electrophysiologist: [name] BiV Pacing: 97.2%  02/15/2021 Weight: 125 lbs   Time in AT/AF  0.0 hr/day (0.0%) (on Coumadin)                                                  Spoke with patient and heart failure questions reviewed.  Pt asymptomatic for fluid accumulation.  Reports feeling well at this time and voices no complaints.    Optivol thoracic impedance suggesting normal fluid levels.     Prescribed:   Torsemide 20 mg 1 tablet tree times a week Mon, Wed, Friday and Saturday.  Can take extra as needed Potassium 10 mEq take 0.5 tablet three times a week Mon, Wed & Friday.  Can take extra as needed Spironolactone 25 mg take 1 tablet daily   Labs: 11/27/2020 Creatinine 1.01, BUN 21, Potassium 4.2, Sodium 135 10/20/2020 Creatinine 1.02, BUN 21, Potassium 3.4, Sodium 135, GFR 69.16  10/06/2020 Creatinine 0.95, BUN 15, Potassium 4.3, Sodium 137  06/21/2020 Creatinine 0.86, BUN 17, Potassium 3.6, Sodium 134, GFr >6 0A complete set of results can be found in Results Review   Recommendations:  No changes and encouraged to call if experiencing any fluid symptoms.   Follow-up plan: ICM clinic phone appointment on 03/20/2021.   91 day device clinic remote transmission 04/20/2021.     EP/Cardiology Office Visits:  3/1/023 with Dr [name].  Recall  06/23/2021 with Dr [name].      Copy of ICM check sent to Dr. [name].      3 month ICM trend: 02/13/2021.    12-14 Month ICM trend:     [name], RN 02/15/2021 4:54 PM

## 2021-02-20 ENCOUNTER — Telehealth (HOSPITAL_COMMUNITY): Payer: Self-pay | Admitting: *Deleted

## 2021-02-20 NOTE — Telephone Encounter (Signed)
Pts wife called to report pt has been very short of breath and having some disorientation for the last couple of days. Pt says he feels better today but asked to be seen in clinic. Pt and wife aware Dr.Bensimhon does not have any appointments but they agreed to see an APP. Appt scheduled.

## 2021-02-20 NOTE — Progress Notes (Signed)
Patient ID: [name], male   DOB: Aug 28, 1940, 81 y.o.   MRN: [id number]    Advanced Heart Failure Clinic Note   Date:  02/21/2021   ID:  [name], [name] 19-Feb-1940, MRN [id number]  PCP:  [name], MD  Primary Cardiologist:  [name] Referring: Dr. [name] HF Cardiologist: Dr. [name]   History of Present Illness: [name] is a 81 y.o. 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 In September 1998 he underwent CABG after an unsuccessful attempt at stenting of his proximal LAD by [name]. EF was 25-30%. He is s/p ICD with upgrade to CRT-D.   Myoview 11/13 showed a large area of scar in the LAD territory (extent 44%) involving the mid to apical anterior, apical and infero-apical to mid infero-septal and apical lateral wall without associated ischemia.  Admitted 5/16 atrial fibrillation and started on Tikosyn.  He was enrolled in the genetic AF trial (bucindolol vs Toprol). He did poorly in the trial in the setting of titration of beta-blocker. He was referred to HF Clinic.  Echo 4/21 EF 20% LV markedly dilated. Severe MR  RV moderately reduced.   Echo reviewed with Dr. Copper regarding MitraClip and LV failure felt to be too advanced.  Had mild COVID in April 2022.   f/u 09/22. Volume looked okay. No changes made.  Saw GI 10/20/20. Completing further workup with [name] for ongoing abdominal pain. CT A/P obtained - incidentally noted moderate to large right pleural effusion. Subsequent CT chest with small pleural effusion.  Seen in ED 11/27/20 for AFL, with rate in 110s. Of note, on course of antibiotics for UTI. He was stable and discharged home and referred for close f/u.   Follow up 11/28/20, remained in AFL, felt bad. DCCV arranged 11/29/20, AFL--> NSR.  Foley placed 11/22 by Urology due to retention. Saw GI 12/22, abdominal cramping improved on Bentyl and PPI.   Not a candidate for cholecystecomy with end stage HF and not felt a good EGD candidate.   Today he returns for an acute visit with his wife. He was markedly SOB this past weekend, sleepy and fatigued. Wife says he was a little disoriented and restless. He was using his hands and making motions in the air, this worried his wife. He took 1/2 of a tablet of lorazepam on Saturday afternoon preceding these events. By Monday morning, wife says he was back to his normal self. He continues to lose weight and this worries him. He is fatigued and breathing is a little worse lately, but able to get around and walk OK on flat ground. Denies CP, dizziness, edema, or PND/Orthopnea. Appetite fair, experiences early satiety. No fever or chills, low back pain. Weight at home 120-122 pounds. Taking all medications. He has a foley, no frank blood, urine is clear yellow. He is asking what he can do about his hernia.  CPX 5/21  FVC 3.17 (89%)      FEV1 2.35 (88%)        FEV1/FVC 74 (98%)        MVV 71 (66%)   Resting HR: 69 Standing HR: 72 Peak HR: 105   (74% age predicted max HR)  BP rest: 104/58 Standing BP: 90/56 BP peak: 112/60  Peak VO2: 15.5 (66% predicted peak VO2)  VE/VCO2 slope:  43  OUES: 1.12  Peak RER: 1.11   Ventilatory Threshold: 13.6 (57%  predicted or measured peak VO2)  VE/MVV:  71%  O2pulse:  9   (90% predicted O2pulse)   Echo 04/04/17 EF 20% with mild to mod reduction RV function.   CPX 02/17/16 Resting HR: 73 Peak HR: 108   (75% age predicted max HR) BP rest: 110/58 BP peak: 150/56 Peak VO2: 14.4 (58% predicted peak VO2) VE/VCO2 slope:  33 OUES: 1.32 Peak RER: 1.10 PETCO2 at peak:  33 O2pulse:  9   (82% predicted O2pulse)  Echo 12/17 EF 15-20%, Moderate MR, RV with reduced function Echo 5/17 EF 20% RV mildly down.    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      allopurinol (ZYLOPRIM) 300 MG tablet Take 300 mg by mouth daily.     benzonatate (TESSALON) 100 MG capsule Take 100 mg by mouth 3 (three) times daily as needed for cough.     bisoprolol (ZEBETA) 5 MG tablet TAKE 1/2 TABLET EVERY DAY 45 tablet 3   cetirizine (ZYRTEC) 10 MG tablet Take 10 mg by mouth daily.     cholecalciferol (VITAMIN D3) 25 MCG (1000 UNIT) tablet Take 1,000 Units by mouth daily.     dapagliflozin propanediol (FARXIGA) 10 MG TABS tablet Take 1 tablet (10 mg total) by mouth daily before breakfast. 90 tablet 3   dicyclomine (BENTYL) 10 MG capsule TAKE 1 CAPSULE EVERY 8 HOURS AS NEEDED FOR SPASM(S) 180 capsule 1   isosorbide mononitrate (IMDUR) 30 MG 24 hr tablet Take 0.5 tablets (15 mg total) by mouth daily. 45 tablet 0   LORazepam (ATIVAN) 1 MG tablet Take 0.25-0.5 mg by mouth at bedtime.     losartan (COZAAR) 25 MG tablet Take 0.5 tablets (12.5 mg total) by mouth 2 (two) times daily. 180 tablet 3   magnesium oxide (MAG-OX) 400 MG tablet Take 400 mg by mouth daily.     mometasone (ASMANEX) 220 MCG/INH inhaler Inhale 2 puffs into the lungs daily as needed (asthma).     montelukast (SINGULAIR) 10 MG tablet Take 1 tablet (10 mg total) by mouth at bedtime. PLEASE SCHEDULE APPOINTMENT. 30 tablet 0   pantoprazole (PROTONIX) 20 MG tablet TAKE 1 TABLET TWICE DAILY 180 tablet 1   Polyethyl Glycol-Propyl Glycol (SYSTANE OP) Place 2-3 drops  into both eyes 3 (three) times daily as needed (dry eyes).     potassium chloride (KLOR-CON) 10 MEQ tablet TAKE 1/2 TABLET ON MONDAY, WEDNESDAY AND FRIDAY . CAN TAKE EXTRA AS NEEDED 45 tablet 3   Psyllium (METAMUCIL) 48.57 % POWD Take 1 Dose by mouth daily as needed (regularity).     simvastatin (ZOCOR) 40 MG tablet TAKE 1 TABLET EVERY DAY (NEED MD APPOINTMENT FOR REFILLS) 90 tablet 3   spironolactone (ALDACTONE) 25 MG tablet TAKE 1 TABLET EVERY DAY 90 tablet  3   tamsulosin (FLOMAX) 0.4 MG CAPS capsule Take 0.4 mg by mouth daily.     torsemide (DEMADEX) 20 MG tablet TAKE 1 TABLET BY MOUTH AS DIRECTED ON MONDAY, WEDNESDAY, FRIDAY AND SATURDAY 52 tablet 2   Vitamins-Lipotropics (LIPO FLAVONOID PLUS) TABS Take 3 tablets by mouth daily in the afternoon.     warfarin (COUMADIN) 5 MG tablet TAKE 1/2 TABLET TO 1 TABLET BY MOUTH DAILY AS DIRECTED BY THE COUMADIN CLINIC . 60 tablet 1   No current facility-administered medications for this encounter.   Allergies:    Allergies  Allergen Reactions   Amoxicillin Rash and Other (See Comments)    Has patient had a PCN reaction causing immediate rash, facial/tongue/throat swelling, SOB or lightheadedness with hypotension: Yes Has patient had a PCN reaction causing severe rash involving mucus membranes or skin necrosis: No Has patient had a PCN reaction that required hospitalization: No Has patient had a PCN reaction occurring within the last 10 years: No If all of the above answers are "NO", then may proceed with Cephalosporin use.    Penicillins Rash    Has patient had a PCN reaction causing immediate rash, facial/tongue/throat swelling, SOB or lightheadedness with hypotension: Yes Has patient had a PCN reaction causing severe rash involving mucus membranes or skin necrosis: No Has patient had a PCN reaction that required hospitalization: No Has patient had a PCN reaction occurring within the last 10 years: No If all of the above answers are "NO", then  may proceed with Cephalosporin use.    Social History:  The patient  reports that he has never smoked. He has never used smokeless tobacco. He reports current alcohol use of about 2.0 standard drinks per week. He reports that he does not use drugs.   Family history:   Family History  Problem Relation Age of Onset   Heart disease Father        questionable   Stroke Mother    Heart failure Sister    Healthy Brother    Healthy Sister    Parkinsonism Brother    Colon cancer Neg Hx    Review of systems complete and found to be negative unless listed in HPI.    PHYSICAL EXAM: BP (!) 100/56    Pulse 63    Wt 55.5 kg    SpO2 97%    BMI 20.37 kg/m   Wt Readings from Last 3 Encounters:  02/21/21 55.5 kg  01/05/21 57.6 kg  11/29/20 54.5 kg   General:  NAD. No resp difficulty, elderly, frail, walked into clinic. HEENT: +temporal wasting Neck: Supple. No JVD. Carotids 2+ bilat; no bruits. No lymphadenopathy or thryomegaly appreciated. Cor: PMI nondisplaced. Regular rate & rhythm. No rubs, gallops, 2/6 MR Lungs: faint crackles LLL Abdomen: Soft, nontender, nondistended. No hepatosplenomegaly. No bruits or masses. Good bowel sounds. Extremities: No cyanosis, clubbing, rash, edema Neuro: Alert & oriented x 3, cranial nerves grossly intact. Moves all 4 extremities w/o difficulty. Affect pleasant.  ECG today: AV pacing 62 bpm (personally reviewed).  Device interrogation:  OptiVol down, thoracic impedence stable, no further AT/AF, >99% biV pacing, <1 hour daily activity (personally reviewed).  ASSESSMENT AND PLAN: 1. 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 PVO2 improved since 2018 but VEVCO2 elevated suggestive of high pulmonary pressures with exercise - Echo 05/18/19 EF 20% with moderate RV dysfunction. LV markedly  dilated LVIDd 7.0 cm Severe MR, mod AI, mod TR.  - NYHA IIIb symptoms in face of  severe biV dysfunction, episodes of worse dyspnea recently. Volume good today on exam and device, ReDs 28%.   - GDMT limited by low BP. - Faint crackles on exam, and CT chest 12/22 showed pleural effusion. Check CXR today. - Rash after taking Entresto.  - Continue losartan 12.5 bid (cut back from 25 bid). - Continue Farxiga 10 mg daily. - Continue torsemide 20 mg MWF/Sat - Continue spiro 25 mg daily. - Continue bisoprolol 2.5 mg daily.  - Dr. [name] has discussed the fact that he is not candidate for advanced therapies including transplant (age), VAD (size and RV dysfunction). MitraClip (LV failure too advanced). I again discussed this today with him and his wife. - Not candidate for Barostim. - ICD interrogated in clinic as above. - Check labs today.  2. PAF/new atrial flutter - s/p DCCV 11/22. - No further issues on device interrogation, AV paced today on ECG. - If has recurrence, may need to consider addition of amiodarone. Previously on Tikosyn, D/C'd 2016 - Continue coumadin. Does not want Eliquis. - No bleeding issues. - CBC today.    3. CAD s/p previous anterior infarct - No s/s ischemia. - Off ASA due to warfarin. Continue statin. - LDL followed by PCP. On statin.  4. LBBB - Now s/p CRT.  - > 99% biV pacing. - No change.  5. Severe MR - Not candidate for Clip.   6. FTT - Body mass index is 20.37 kg/m. - Encouraged increased calorie intake, continue Boost. He likes falafel, told him to eat favorite foods.  - Followed by GI, conservative management for gallstones and elevated alk phos.  - Could consider trial of megace, will defer to PCP. - Suspect overall decline is multifactorial and not all attributable to progressive HF.  7. Confusion - 1 episode, likely related to lorazepam use for sleep.  - Advised discussion with PCP re: different sleep agent (Belsomra?) and avoid benzos for now. - Check ammonia, CBC.   8. Urinary Retention - He follows up monthly with  Urology for foley exchange,  - No current UTI symptoms.  - If he has UTI, will need to stop SGLT2i. Wife aware.  9. Anxiety - His wife tells me he worries over his medical issues. Patient agrees. - We discussed counseling, guided imagery/breathing exercises and speaking with PCP about starting SSRI. - I think some of his dyspnea is related to anxiety.  Follow up with Dr. [name] in 2 months as scheduled.  Rafael Bihari, FNP  12:22 PM  Greater than 50% of the (total minutes 35) visit spent in counseling/coordination of care regarding (advanced heart failure trajectory, current medications, recommendations for dyspnea work up, and insomnia and anxiety treatments)

## 2021-02-21 ENCOUNTER — Other Ambulatory Visit: Payer: Self-pay

## 2021-02-21 ENCOUNTER — Ambulatory Visit (HOSPITAL_COMMUNITY)
Admission: RE | Admit: 2021-02-21 | Discharge: 2021-02-21 | Disposition: A | Discharge status: Home / Self Care | Payer: Medicare HMO | Source: Ambulatory Visit | Attending: Cardiology | Admitting: Cardiology

## 2021-02-21 ENCOUNTER — Encounter: Payer: Self-pay | Admitting: Family Medicine

## 2021-02-21 ENCOUNTER — Encounter (HOSPITAL_COMMUNITY): Payer: Self-pay

## 2021-02-21 VITALS — BP 100/56 | HR 63 | Wt 122.4 lb

## 2021-02-21 DIAGNOSIS — R0602 Shortness of breath: Secondary | ICD-10-CM | POA: Diagnosis not present

## 2021-02-21 DIAGNOSIS — R410 Disorientation, unspecified: Secondary | ICD-10-CM | POA: Diagnosis not present

## 2021-02-21 DIAGNOSIS — I252 Old myocardial infarction: Secondary | ICD-10-CM | POA: Insufficient documentation

## 2021-02-21 DIAGNOSIS — I2510 Atherosclerotic heart disease of native coronary artery without angina pectoris: Secondary | ICD-10-CM | POA: Diagnosis not present

## 2021-02-21 DIAGNOSIS — Z7901 Long term (current) use of anticoagulants: Secondary | ICD-10-CM | POA: Diagnosis not present

## 2021-02-21 DIAGNOSIS — J90 Pleural effusion, not elsewhere classified: Secondary | ICD-10-CM | POA: Insufficient documentation

## 2021-02-21 DIAGNOSIS — I340 Nonrheumatic mitral (valve) insufficiency: Secondary | ICD-10-CM

## 2021-02-21 DIAGNOSIS — Z95810 Presence of automatic (implantable) cardiac defibrillator: Secondary | ICD-10-CM

## 2021-02-21 DIAGNOSIS — I255 Ischemic cardiomyopathy: Secondary | ICD-10-CM | POA: Diagnosis not present

## 2021-02-21 DIAGNOSIS — F419 Anxiety disorder, unspecified: Secondary | ICD-10-CM

## 2021-02-21 DIAGNOSIS — I447 Left bundle-branch block, unspecified: Secondary | ICD-10-CM | POA: Diagnosis not present

## 2021-02-21 DIAGNOSIS — Z7984 Long term (current) use of oral hypoglycemic drugs: Secondary | ICD-10-CM | POA: Diagnosis not present

## 2021-02-21 DIAGNOSIS — I509 Heart failure, unspecified: Secondary | ICD-10-CM

## 2021-02-21 DIAGNOSIS — I5022 Chronic systolic (congestive) heart failure: Secondary | ICD-10-CM | POA: Diagnosis not present

## 2021-02-21 DIAGNOSIS — Z79899 Other long term (current) drug therapy: Secondary | ICD-10-CM | POA: Insufficient documentation

## 2021-02-21 DIAGNOSIS — Z8249 Family history of ischemic heart disease and other diseases of the circulatory system: Secondary | ICD-10-CM | POA: Insufficient documentation

## 2021-02-21 DIAGNOSIS — Z7951 Long term (current) use of inhaled steroids: Secondary | ICD-10-CM | POA: Insufficient documentation

## 2021-02-21 DIAGNOSIS — I517 Cardiomegaly: Secondary | ICD-10-CM | POA: Diagnosis not present

## 2021-02-21 DIAGNOSIS — I083 Combined rheumatic disorders of mitral, aortic and tricuspid valves: Secondary | ICD-10-CM | POA: Diagnosis not present

## 2021-02-21 DIAGNOSIS — R339 Retention of urine, unspecified: Secondary | ICD-10-CM | POA: Diagnosis not present

## 2021-02-21 DIAGNOSIS — R627 Adult failure to thrive: Secondary | ICD-10-CM | POA: Diagnosis not present

## 2021-02-21 DIAGNOSIS — R918 Other nonspecific abnormal finding of lung field: Secondary | ICD-10-CM | POA: Diagnosis not present

## 2021-02-21 DIAGNOSIS — I480 Paroxysmal atrial fibrillation: Secondary | ICD-10-CM | POA: Diagnosis not present

## 2021-02-21 DIAGNOSIS — I4892 Unspecified atrial flutter: Secondary | ICD-10-CM

## 2021-02-21 DIAGNOSIS — Z951 Presence of aortocoronary bypass graft: Secondary | ICD-10-CM | POA: Diagnosis not present

## 2021-02-21 LAB — URIC ACID: Uric Acid, Serum: 3.3 mg/dL — ABNORMAL LOW (ref 3.7–8.6)

## 2021-02-21 LAB — CBC
HCT: 41.5 % (ref 39.0–52.0)
Hemoglobin: 13.6 g/dL (ref 13.0–17.0)
MCH: 29.8 pg (ref 26.0–34.0)
MCHC: 32.8 g/dL (ref 30.0–36.0)
MCV: 90.8 fL (ref 80.0–100.0)
Platelets: 237 10*3/uL (ref 150–400)
RBC: 4.57 MIL/uL (ref 4.22–5.81)
RDW: 14.5 % (ref 11.5–15.5)
WBC: 7.7 10*3/uL (ref 4.0–10.5)
nRBC: 0 % (ref 0.0–0.2)

## 2021-02-21 LAB — COMPREHENSIVE METABOLIC PANEL
ALT: 15 U/L (ref 0–44)
AST: 20 U/L (ref 15–41)
Albumin: 3.3 g/dL — ABNORMAL LOW (ref 3.5–5.0)
Alkaline Phosphatase: 116 U/L (ref 38–126)
Anion gap: 8 (ref 5–15)
BUN: 15 mg/dL (ref 8–23)
CO2: 28 mmol/L (ref 22–32)
Calcium: 9 mg/dL (ref 8.9–10.3)
Chloride: 100 mmol/L (ref 98–111)
Creatinine, Ser: 0.96 mg/dL (ref 0.61–1.24)
GFR, Estimated: >60 mL/min (ref >60)
Glucose, Bld: 134 mg/dL — ABNORMAL HIGH (ref 70–99)
Potassium: 3.5 mmol/L (ref 3.5–5.1)
Sodium: 136 mmol/L (ref 135–145)
Total Bilirubin: 1.5 mg/dL — ABNORMAL HIGH (ref 0.3–1.2)
Total Protein: 6.5 g/dL (ref 6.5–8.1)

## 2021-02-21 NOTE — Patient Instructions (Signed)
Medication Changes:  No changes  Lab Work:  Labs done today, your results will be available in MyChart, we will contact you for abnormal readings.   Testing/Procedures:  none  Referrals:  none  Special Instructions // Education:  none  Follow-Up in: Keep follow up with Dr. [name]  At the [facility], you and your health needs are our priority. We have a designated team specialized in the treatment of Heart Failure. This Care Team includes your primary Heart Failure Specialized Cardiologist (physician), Advanced Practice Providers (APPs- Physician Assistants and Nurse Practitioners), and Pharmacist who all work together to provide you with the care you need, when you need it.   You may see any of the following providers on your designated Care Team at your next follow up:  Dr Glori Bickers Dr Haynes Kerns, NP Lyda Jester, [place] [facility], [place] Audry Riles, PharmD   Please be sure to bring in all your medications bottles to every appointment.   Need to Contact us:  If you have any questions or concerns before your next appointment please send [name] a message through [employer] or call our office at [phone].    TO LEAVE A MESSAGE FOR THE NURSE SELECT OPTION 2, PLEASE LEAVE A MESSAGE INCLUDING: YOUR NAME DATE OF BIRTH CALL BACK NUMBER REASON FOR CALL**this is important as we prioritize the call backs  YOU WILL RECEIVE A CALL BACK THE SAME DAY AS LONG AS YOU CALL BEFORE 4:00 PM

## 2021-02-21 NOTE — Progress Notes (Signed)
ReDS Vest / Clip - 02/21/21 1200       ReDS Vest / Clip   Station Marker A    Ruler Value 27    ReDS Value Range Low volume    ReDS Actual Value 28

## 2021-02-22 ENCOUNTER — Other Ambulatory Visit (HOSPITAL_COMMUNITY): Payer: Self-pay | Admitting: Cardiovascular Disease

## 2021-02-28 ENCOUNTER — Telehealth (HOSPITAL_COMMUNITY): Payer: Self-pay | Admitting: Pharmacy Technician
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
## 2021-03-02 ENCOUNTER — Other Ambulatory Visit (HOSPITAL_COMMUNITY): Payer: Self-pay | Admitting: Internal Medicine

## 2021-03-03 DIAGNOSIS — H4720 Unspecified optic atrophy: Secondary | ICD-10-CM | POA: Diagnosis not present

## 2021-03-03 DIAGNOSIS — Z961 Presence of intraocular lens: Secondary | ICD-10-CM | POA: Diagnosis not present

## 2021-03-06 NOTE — Telephone Encounter (Signed)
Advanced Heart Failure Patient Advocate Encounter  Patient called Friday requesting help with [place]. I called [employer] to see why the RX was not delivered, representative stated that the RX was last delivered 11/18 for a 90 day supply and the patient should not be out of medication. RX is processing with the pharmacy. He was provided a one month supply of Wilder Glade so he should have enough to last until its delivered.  Called and left the patient a message.   [name], CPhT

## 2021-03-09 ENCOUNTER — Other Ambulatory Visit: Payer: Self-pay | Admitting: Gastroenterology

## 2021-03-09 DIAGNOSIS — Z961 Presence of intraocular lens: Secondary | ICD-10-CM | POA: Diagnosis not present

## 2021-03-09 DIAGNOSIS — H469 Unspecified optic neuritis: Secondary | ICD-10-CM | POA: Diagnosis not present

## 2021-03-09 DIAGNOSIS — H353211 Exudative age-related macular degeneration, right eye, with active choroidal neovascularization: Secondary | ICD-10-CM | POA: Diagnosis not present

## 2021-03-09 DIAGNOSIS — H353122 Nonexudative age-related macular degeneration, left eye, intermediate dry stage: Secondary | ICD-10-CM | POA: Diagnosis not present

## 2021-03-14 DIAGNOSIS — R3914 Feeling of incomplete bladder emptying: Secondary | ICD-10-CM | POA: Diagnosis not present

## 2021-03-15 ENCOUNTER — Other Ambulatory Visit: Payer: Self-pay

## 2021-03-15 ENCOUNTER — Ambulatory Visit (INDEPENDENT_AMBULATORY_CARE_PROVIDER_SITE_OTHER): Payer: Medicare HMO

## 2021-03-15 DIAGNOSIS — Z5181 Encounter for therapeutic drug level monitoring: Secondary | ICD-10-CM

## 2021-03-15 LAB — POCT INR: INR: 2.6 (ref 2.0–3.0)

## 2021-03-15 NOTE — Patient Instructions (Signed)
Continue 0.5 tablet daily except 1 tablet each Monday, Wednesday and Friday.  Repeat INR in 6 weeks

## 2021-03-20 ENCOUNTER — Ambulatory Visit (INDEPENDENT_AMBULATORY_CARE_PROVIDER_SITE_OTHER): Payer: Medicare HMO

## 2021-03-20 ENCOUNTER — Other Ambulatory Visit: Payer: Self-pay | Admitting: Gastroenterology

## 2021-03-20 DIAGNOSIS — Z95810 Presence of automatic (implantable) cardiac defibrillator: Secondary | ICD-10-CM

## 2021-03-20 DIAGNOSIS — I509 Heart failure, unspecified: Secondary | ICD-10-CM | POA: Diagnosis not present

## 2021-03-21 DIAGNOSIS — I5022 Chronic systolic (congestive) heart failure: Secondary | ICD-10-CM | POA: Diagnosis not present

## 2021-03-21 DIAGNOSIS — H9313 Tinnitus, bilateral: Secondary | ICD-10-CM | POA: Diagnosis not present

## 2021-03-21 NOTE — Progress Notes (Signed)
EPIC Encounter for ICM Monitoring  Patient Name: [name] is a 81 y.o. male Date: 03/21/2021 Primary Care Physican: [name], MD Primary Cardiologist: Kelly/Bensimhon Electrophysiologist: [name] BiV Pacing: 96.8%  03/21/2021 Weight: 125 lbs   Time in AT/AF  <0.1 hr/day (<0.1%)(on Coumadin)                                                  Spoke with patient and heart failure questions reviewed.  Pt reports swelling of the ankles.     Optivol thoracic impedance suggesting possible fluid accumulation starting 2/13.     Prescribed:   Torsemide 20 mg 1 tablet as directed take Mon, Wed, Friday and Saturday.  Can take extra as needed Potassium 10 mEq take 0.5 tablet three times a week Mon, Wed & Friday.  Can take extra as needed Spironolactone 25 mg take 1 tablet daily   Labs: 11/27/2020 Creatinine 1.01, BUN 21, Potassium 4.2, Sodium 135 10/20/2020 Creatinine 1.02, BUN 21, Potassium 3.4, Sodium 135, GFR 69.16  10/06/2020 Creatinine 0.95, BUN 15, Potassium 4.3, Sodium 137  06/21/2020 Creatinine 0.86, BUN 17, Potassium 3.6, Sodium 134, GFr >6 0A complete set of results can be found in Results Review   Recommendations:   Advised to take extra Torsemide 20 mg with extra Potassium tablet extra day this week on Thursday.  Advise to limit salt intake.    Follow-up plan: ICM clinic phone appointment on 03/28/2021 to recheck fluid levels.   91 day device clinic remote transmission 04/20/2021.     EP/Cardiology Office Visits:  3/1/023 with Dr [name].  Recall  06/23/2021 with Dr [name].      Copy of ICM check sent to Dr. [name].      3 month ICM trend: 03/20/2021.    12-14 Month ICM trend:     [name], RN 03/21/2021 9:42 AM

## 2021-03-28 ENCOUNTER — Ambulatory Visit (INDEPENDENT_AMBULATORY_CARE_PROVIDER_SITE_OTHER): Payer: Medicare HMO

## 2021-03-28 DIAGNOSIS — I509 Heart failure, unspecified: Secondary | ICD-10-CM

## 2021-03-28 DIAGNOSIS — Z95810 Presence of automatic (implantable) cardiac defibrillator: Secondary | ICD-10-CM

## 2021-03-28 NOTE — Progress Notes (Signed)
EPIC Encounter for ICM Monitoring  Patient Name: [name] is a 81 y.o. male Date: 03/28/2021 Primary Care Physican: [name], MD Primary Cardiologist: Kelly/Bensimhon Electrophysiologist: [name] BiV Pacing: 96.5%  03/21/2021 Weight: 125 lbs   Time in AT/AF  0.0 hr/day (0.0%)(on Coumadin)                                                  Spoke with patient and heart failure questions reviewed.  Pt reports he feels fine and no lower extremity swelling.    Optivol thoracic impedance suggesting fluid levels returned to normal after taking extra Torsemide.      Prescribed:   Torsemide 20 mg 1 tablet as directed take Mon, Wed, Friday and Saturday.  Can take extra as needed Potassium 10 mEq take 0.5 tablet three times a week Mon, Wed & Friday.  Can take extra as needed Spironolactone 25 mg take 1 tablet daily   Labs: 11/27/2020 Creatinine 1.01, BUN 21, Potassium 4.2, Sodium 135 10/20/2020 Creatinine 1.02, BUN 21, Potassium 3.4, Sodium 135, GFR 69.16  10/06/2020 Creatinine 0.95, BUN 15, Potassium 4.3, Sodium 137  06/21/2020 Creatinine 0.86, BUN 17, Potassium 3.6, Sodium 134, GFr >6 0A complete set of results can be found in Results Review   Recommendations:   Advised to continue to limit salt intake and call if experiencing any fluid symptoms.   Follow-up plan: ICM clinic phone appointment on 04/24/2021.   91 day device clinic remote transmission 04/20/2021.     EP/Cardiology Office Visits:  3/1/023 with Dr [name].  Recall  06/23/2021 with Dr [name].      Copy of ICM check sent to Dr. [name].      3 month ICM trend: 03/28/2021.    12-14 Month ICM trend:     [name], RN 03/28/2021 4:51 PM

## 2021-03-29 ENCOUNTER — Ambulatory Visit (HOSPITAL_COMMUNITY)
Admission: RE | Admit: 2021-03-29 | Discharge: 2021-03-29 | Disposition: A | Discharge status: Home / Self Care | Payer: Medicare HMO | Source: Ambulatory Visit | Attending: Internal Medicine | Admitting: Internal Medicine

## 2021-03-29 ENCOUNTER — Other Ambulatory Visit: Payer: Self-pay

## 2021-03-29 VITALS — BP 106/70 | HR 71 | Wt 125.8 lb

## 2021-03-29 DIAGNOSIS — R0602 Shortness of breath: Secondary | ICD-10-CM | POA: Diagnosis not present

## 2021-03-29 DIAGNOSIS — I2510 Atherosclerotic heart disease of native coronary artery without angina pectoris: Secondary | ICD-10-CM | POA: Diagnosis not present

## 2021-03-29 DIAGNOSIS — I4892 Unspecified atrial flutter: Secondary | ICD-10-CM | POA: Insufficient documentation

## 2021-03-29 DIAGNOSIS — Z79899 Other long term (current) drug therapy: Secondary | ICD-10-CM | POA: Diagnosis not present

## 2021-03-29 DIAGNOSIS — I447 Left bundle-branch block, unspecified: Secondary | ICD-10-CM | POA: Insufficient documentation

## 2021-03-29 DIAGNOSIS — Z951 Presence of aortocoronary bypass graft: Secondary | ICD-10-CM | POA: Insufficient documentation

## 2021-03-29 DIAGNOSIS — I480 Paroxysmal atrial fibrillation: Secondary | ICD-10-CM | POA: Insufficient documentation

## 2021-03-29 DIAGNOSIS — R531 Weakness: Secondary | ICD-10-CM | POA: Diagnosis not present

## 2021-03-29 DIAGNOSIS — I252 Old myocardial infarction: Secondary | ICD-10-CM | POA: Insufficient documentation

## 2021-03-29 DIAGNOSIS — Z7901 Long term (current) use of anticoagulants: Secondary | ICD-10-CM | POA: Diagnosis not present

## 2021-03-29 DIAGNOSIS — Z95810 Presence of automatic (implantable) cardiac defibrillator: Secondary | ICD-10-CM | POA: Insufficient documentation

## 2021-03-29 DIAGNOSIS — I5022 Chronic systolic (congestive) heart failure: Secondary | ICD-10-CM | POA: Diagnosis not present

## 2021-03-29 DIAGNOSIS — F419 Anxiety disorder, unspecified: Secondary | ICD-10-CM | POA: Diagnosis not present

## 2021-03-29 DIAGNOSIS — Z7984 Long term (current) use of oral hypoglycemic drugs: Secondary | ICD-10-CM | POA: Diagnosis not present

## 2021-03-29 LAB — BASIC METABOLIC PANEL
Anion gap: 7 (ref 5–15)
BUN: 20 mg/dL (ref 8–23)
CO2: 28 mmol/L (ref 22–32)
Calcium: 9.2 mg/dL (ref 8.9–10.3)
Chloride: 102 mmol/L (ref 98–111)
Creatinine, Ser: 0.93 mg/dL (ref 0.61–1.24)
GFR, Estimated: >60 mL/min (ref >60)
Glucose, Bld: 121 mg/dL — ABNORMAL HIGH (ref 70–99)
Potassium: 4.4 mmol/L (ref 3.5–5.1)
Sodium: 137 mmol/L (ref 135–145)

## 2021-03-29 LAB — BRAIN NATRIURETIC PEPTIDE: B Natriuretic Peptide: 1659.2 pg/mL — ABNORMAL HIGH (ref 0.0–100.0)

## 2021-03-29 MED ORDER — METOLAZONE 2.5 MG PO TABS: 2.5000 mg | ORAL_TABLET | ORAL | 2 refills | Status: DC

## 2021-03-29 NOTE — Progress Notes (Signed)
Patient ID: [name], male   DOB: 1940/07/22, 81 y.o.   MRN: [id number]    Advanced Heart Failure Clinic Note   Date:  03/29/2021   ID:  Jason, [name] 1940-04-29, MRN [id number]  PCP:  Jason Frees, MD  Primary Cardiologist:  [name] Referring: Dr. Lia [name] HF Cardiologist: [name]   History of Present Illness: [name] is a 81 y.o. 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 In September 1998 he underwent CABG after an unsuccessful attempt at stenting of his proximal LAD by [name]. EF was 25-30%. He is s/p ICD with upgrade to CRT-D.   Myoview 11/13 showed a large area of scar in the LAD territory (extent 44%) involving the mid to apical anterior, apical and infero-apical to mid infero-septal and apical lateral wall without associated ischemia.  Admitted 5/16 atrial fibrillation and started on Tikosyn.  He was enrolled in the genetic AF trial (bucindolol vs Toprol). He did poorly in the trial in the setting of titration of beta-blocker. He was referred to HF Clinic.  Echo 4/21 EF 20% LV markedly dilated. Severe MR  RV moderately reduced.   Echo reviewed with Dr. Copper regarding MitraClip and LV failure felt to be too advanced.  Had mild COVID in April 2022.   f/u 09/22. Volume looked okay. No changes made.  Saw GI 10/20/20. Completing further workup with [name] for ongoing abdominal pain. CT A/P obtained - incidentally noted moderate to large right pleural effusion. Subsequent CT chest with small pleural effusion.  Seen in ED 11/27/20 for AFL, with rate in 110s. Of note, on course of antibiotics for UTI. He was stable and discharged home and referred for close f/u.   Follow up 11/28/20, remained in AFL, felt bad. DCCV arranged 11/29/20, AFL--> NSR.  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 Continues to feel weak. SOB with any activity. + orthopnea. Taking torsemide 20 mg 1 tablet on Mon, Wed, Friday and Saturday. Optivol reviewed mildly elevated from baseline but well under threshold. ReDS 42%. Feels anxious over his health issues. Taking Ativan to help sleep at night.  CPX 5/21  FVC 3.17 (89%)      FEV1 2.35 (88%)        FEV1/FVC 74 (98%)        MVV 71 (66%)   Resting HR: 69 Standing HR: 72 Peak HR: 105   (74% age predicted max HR)  BP rest: 104/58 Standing BP: 90/56 BP peak: 112/60  Peak VO2: 15.5 (66% predicted peak VO2)  VE/VCO2 slope:  43  OUES: 1.12  Peak RER: 1.11   Ventilatory Threshold: 13.6 (57% predicted or measured peak VO2)  VE/MVV:  71%  O2pulse:  9   (90% predicted O2pulse)   Echo 04/04/17 EF 20% with mild to mod reduction RV function.   CPX 02/17/16 Resting HR: 73 Peak HR: 108   (75% age predicted max HR) BP rest: 110/58 BP peak: 150/56 Peak VO2: 14.4 (58% predicted peak VO2) VE/VCO2 slope:  33 OUES: 1.32 Peak RER: 1.10 PETCO2 at peak:  33 O2pulse:  9   (82% predicted O2pulse)  Echo 12/17 EF 15-20%, Moderate MR, RV with reduced function Echo 5/17 EF 20% RV mildly down.    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      allopurinol (ZYLOPRIM) 300 MG tablet Take 300 mg by mouth  daily.     benzonatate (TESSALON) 100 MG capsule Take 100 mg by mouth 3 (three) times daily as needed for cough.     bisoprolol (ZEBETA) 5 MG tablet TAKE 1/2 TABLET EVERY DAY 45 tablet 3   cetirizine (ZYRTEC) 10 MG tablet Take 10 mg by mouth daily.     cholecalciferol (VITAMIN D3) 25 MCG (1000 UNIT) tablet Take 1,000 Units by mouth daily.     dapagliflozin propanediol (FARXIGA) 10 MG TABS tablet Take 1 tablet (10 mg total) by mouth daily before breakfast. 90 tablet 3   dicyclomine (BENTYL) 10 MG capsule TAKE 1 CAPSULE EVERY 8 HOURS AS NEEDED FOR SPASM(S) 180 capsule 1   isosorbide mononitrate (IMDUR) 30 MG 24 hr tablet TAKE 1/2 TABLET (15 MG) BY MOUTH DAILY 45 tablet 0   LORazepam (ATIVAN) 1 MG tablet Take 0.25-0.5 mg by mouth at bedtime.     losartan (COZAAR) 25 MG tablet Take 0.5 tablets (12.5 mg total) by mouth 2 (two) times daily. 180 tablet 3   magnesium oxide (MAG-OX) 400 MG tablet Take 400 mg by mouth daily.     mometasone (ASMANEX) 220 MCG/INH inhaler Inhale 2 puffs into the lungs daily as needed (asthma).     montelukast (SINGULAIR) 10 MG tablet Take 1 tablet (10 mg total) by mouth at bedtime. PLEASE SCHEDULE APPOINTMENT. 30 tablet 0   pantoprazole (PROTONIX) 20 MG tablet TAKE 1 TABLET TWICE DAILY 180 tablet 1   Polyethyl Glycol-Propyl Glycol (SYSTANE OP) Place 2-3 drops into both eyes 3 (three) times daily as needed (dry eyes).     potassium chloride (KLOR-CON) 10 MEQ tablet TAKE 1/2 TABLET ON MONDAY, WEDNESDAY AND FRIDAY . CAN TAKE EXTRA AS NEEDED 45 tablet 3   Psyllium (METAMUCIL) 48.57 % POWD Take 1 Dose by mouth daily as needed (regularity).     simvastatin (ZOCOR) 40 MG tablet TAKE 1 TABLET EVERY DAY (NEED MD APPOINTMENT FOR REFILLS) 90 tablet 3   spironolactone (ALDACTONE) 25 MG tablet TAKE 1 TABLET EVERY DAY 90 tablet 3   tamsulosin (FLOMAX) 0.4 MG CAPS capsule Take 0.4 mg by mouth daily.     torsemide (DEMADEX) 20 MG tablet TAKE 1 TABLET BY MOUTH AS DIRECTED ON MONDAY,  WEDNESDAY, FRIDAY AND SATURDAY 52 tablet 2   Vitamins-Lipotropics (LIPO FLAVONOID PLUS) TABS Take 3 tablets by mouth daily in the afternoon.     warfarin (COUMADIN) 5 MG tablet TAKE 1/2 TABLET TO 1 TABLET DAILY AS DIRECTED BY THE COUMADIN CLINIC 90 tablet 1   No current facility-administered medications for this encounter.   Allergies:    Allergies  Allergen Reactions   Amoxicillin Rash and Other (See Comments)    Has patient had a PCN reaction causing  immediate rash, facial/tongue/throat swelling, SOB or lightheadedness with hypotension: Yes Has patient had a PCN reaction causing severe rash involving mucus membranes or skin necrosis: No Has patient had a PCN reaction that required hospitalization: No Has patient had a PCN reaction occurring within the last 10 years: No If all of the above answers are "NO", then may proceed with Cephalosporin use.    Penicillins Rash    Has patient had a PCN reaction causing immediate rash, facial/tongue/throat swelling, SOB or lightheadedness with hypotension: Yes Has patient had a PCN reaction causing severe rash involving mucus membranes or skin necrosis: No Has patient had a PCN reaction that required hospitalization: No Has patient had a PCN reaction occurring within the last 10 years: No If all of the above answers are "NO", then may proceed with Cephalosporin use.    Social History:  The patient  reports that he has never smoked. He has never used smokeless tobacco. He reports current alcohol use of about 2.0 standard drinks per week. He reports that he does not use drugs.   Family history:   Family History  Problem Relation Age of Onset   Heart disease Father        questionable   Stroke Mother    Heart failure Sister    Healthy Brother    Healthy Sister    Parkinsonism Brother    Colon cancer Neg Hx    Review of systems complete and found to be negative unless listed in HPI.    PHYSICAL EXAM: BP 106/70    Pulse 71    Wt 57.1 kg (125  lb 12.8 oz)    SpO2 99%    BMI 20.93 kg/m   Wt Readings from Last 3 Encounters:  03/29/21 57.1 kg (125 lb 12.8 oz)  02/21/21 55.5 kg (122 lb 6.4 oz)  01/05/21 57.6 kg (127 lb)   General:  Elderly frail. No resp difficulty HEENT: normal Neck: supple. JVP to jaw Carotids 2+ bilat; no bruits. No lymphadenopathy or thryomegaly appreciated. Cor: PMI nondisplaced. Regular rate & rhythm. 2/6 MR. Lungs: clear Abdomen: soft, nontender, nondistended. No hepatosplenomegaly. No bruits or masses. Good bowel sounds. Extremities: no cyanosis, clubbing, rash, edema Neuro: alert & orientedx3, cranial nerves grossly intact. moves all 4 extremities w/o difficulty. Affect pleasant   Device interrogation:  OptiVol up from baseline but under threshold. No AF/VT. Activity level 0.4hr/day Personally reviewed  ASSESSMENT AND PLAN: 1. 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 PVO2 improved since 2018 but VEVCO2 elevated suggestive of high pulmonary pressures with exercise - Echo 05/18/19 EF 20% with moderate RV dysfunction. LV markedly dilated LVIDd 7.0 cm Severe MR, mod AI, mod TR.  - NYHA IIIb symptoms in face of severe BiV dysfunction. Volume up today on exam and ReDDs, ReDs 28% -> 42%.   - GDMT limited by low BP. - Rash after taking Entresto.  - Continue losartan 12.5 bid (cut back from 25 bid). - Continue Farxiga 10 mg daily. - Continue torsemide 20 mg MWF/Sat. Add metolazone 2.5 q wed + Kcl 40  - Continue spiro 25 mg daily. - Continue bisoprolol 2.5 mg daily.  - Not candidate for advanced therapies including transplant (age), VAD (size and RV dysfunction). MitraClip (LV failure too advanced). I again discussed this today with him and his wife. - Not candidate for Barostim. - ICD interrogated in clinic as  above. - Labs today.  2. PAF/new atrial flutter - s/p DCCV 11/22. - No further AF on ICD today - If  has recurrence, may need to consider addition of amiodarone. Previously on Tikosyn, D/C'd 2016 - Continue coumadin. Does not want Eliquis. - No bleeding issues.   3. CAD s/p previous anterior infarct - No s/s ischemia. - Off ASA due to warfarin. Continue statin. - LDL followed by PCP. On statin.  4. LBBB - Now s/p CRT.  - 99%  biV pacing on device.. - No change.  5. Severe MR - Not candidate for Clip.   6. Urinary Retention - He follows up monthly with Urology for foley exchange,  - No current UTI symptoms.  - If he has UTI, will need to stop SGLT2i. Wife aware.  7. Anxiety - He understandably has significant anxiety over his medical issues - Long discussion about his worsening trajectory. Discussed role of SSRI (Zoloft) as well as Palliative Care for symptom management. They want to think about it.   Total time spent 45 minutes. Over half that time spent discussing above.    Glori Bickers, MD  12:23 PM

## 2021-03-29 NOTE — H&P (View-Only) (Signed)
Patient ID: [name], male   DOB: [birth year], 81 y.o.   MRN: [id number] ? ?  ?Advanced Heart Failure Clinic Note  ? ?Date:  03/29/2021  ? ?ID:  [name], DOB [birth year], MRN [id number] ? ?PCP:  [name], MD  ?Primary Cardiologist:  [name] ?Referring: Dr. [name] ?HF Cardiologist: Dr. [name] ?  ?History of Present Illness: ?[name] is a 81 y.o. 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 In September 1998 he underwent CABG after an unsuccessful attempt at stenting of his proximal LAD by [name]. EF was 25-30%. He is s/p ICD with upgrade to CRT-D.  ? ?Myoview 11/13 showed a large area of scar in the LAD territory (extent 44%) involving the mid to apical anterior, apical and infero-apical to mid infero-septal and apical lateral wall without associated ischemia. ? ?Admitted 5/16 atrial fibrillation and started on Tikosyn.  He was enrolled in the genetic AF trial (bucindolol vs Toprol). He did poorly in the trial in the setting of titration of beta-blocker. He was referred to HF Clinic. ? ?Echo 4/21 EF 20% LV markedly dilated. Severe MR  RV moderately reduced.  ? ?Echo reviewed with Dr. Copper regarding MitraClip and LV failure felt to be too advanced. ? ?Had mild COVID in April 2022.  ? ?f/u 09/22. Volume looked okay. No changes made. ? ?Saw GI 10/20/20. Completing further workup with [name] for ongoing abdominal pain. CT A/P obtained - incidentally noted moderate to large right pleural effusion. Subsequent CT chest with small pleural effusion. ? ?Seen in ED 11/27/20 for AFL, with rate in 110s. Of note, on course of antibiotics for UTI. He was stable and discharged home and referred for close f/u.  ? ?Follow up 11/28/20, remained in AFL, felt bad. DCCV arranged 11/29/20, AFL--> NSR. ? 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 ?Returns for f/u with his wife and  son. Continues to feel weak. SOB with any activity. + orthopnea. Taking torsemide 20 mg 1 tablet on Mon, Wed, Friday and Saturday. Optivol reviewed mildly elevated from baseline but well under threshold. ReDS 42%. Feels anxious over his health issues. Taking Ativan to help sleep at night. ? ?CPX 5/21 ? ?FVC 3.17 (89%)      ?FEV1 2.35 (88%)        ?FEV1/FVC 74 (98%)        ?MVV 71 (66%)  ? ?Resting HR: 69 Standing HR: 72 Peak HR: 105   (74% age predicted max HR)  ?BP rest: 104/58 Standing BP: 90/56 BP peak: 112/60  ?Peak VO2: 15.5 (66% predicted peak VO2)  ?VE/VCO2 slope:  43  ?OUES: 1.12  ?Peak RER: 1.11  ? ?Ventilatory Threshold: 13.6 (57% predicted or measured peak VO2)  ?VE/MVV:  71%  ?O2pulse:  9   (90% predicted O2pulse)  ? ?Echo 04/04/17 EF 20% with mild to mod reduction RV function.  ? ?CPX 02/17/16 ?Resting HR: 73 Peak HR: 108   (75% age predicted max HR) ?BP rest: 110/58 BP peak: 150/56 ?Peak VO2: 14.4 (58% predicted peak VO2) ?VE/VCO2 slope:  33 ?OUES: 1.32 ?Peak RER: 1.10 ?PETCO2 at peak:  33 ?O2pulse:  9   (82% predicted O2pulse) ? ?Echo 12/17 EF 15-20%, Moderate MR, RV with reduced function ?Echo 5/17 EF 20% RV mildly down.  ? ? ?Past Medical History:  ?Diagnosis Date  ? Acute on chronic systolic CHF (  congestive heart failure) ([place]) 06/09/2015  ? Automatic implantable cardiac defibrillator in situ 2002; 2010  ? medtronic virtuso  ? Cardiomyopathy, ischemic 2011  ? with EF 25-35% by echo  ? Coronary artery disease   ? Hx MI 1992, CABG 1998 , Nuc study 11.2013 large scar but no ischemia  ? GERD (gastroesophageal reflux disease)   ? H/O myocardial infarction, greater than 8 weeks 1992  ? large ant wall injury  ? NSVT (nonsustained ventricular tachycardia)   ? Paroxysmal atrial fibrillation (HCC)   ? ?Current Outpatient Medications  ?Medication Sig Dispense Refill  ? acetaminophen (TYLENOL) 650 MG CR tablet Take 650 mg by mouth 2 (two) times daily.     ? allopurinol (ZYLOPRIM) 300 MG tablet Take 300 mg by mouth  daily.    ? benzonatate (TESSALON) 100 MG capsule Take 100 mg by mouth 3 (three) times daily as needed for cough.    ? bisoprolol (ZEBETA) 5 MG tablet TAKE 1/2 TABLET EVERY DAY 45 tablet 3  ? cetirizine (ZYRTEC) 10 MG tablet Take 10 mg by mouth daily.    ? cholecalciferol (VITAMIN D3) 25 MCG (1000 UNIT) tablet Take 1,000 Units by mouth daily.    ? dapagliflozin propanediol (FARXIGA) 10 MG TABS tablet Take 1 tablet (10 mg total) by mouth daily before breakfast. 90 tablet 3  ? dicyclomine (BENTYL) 10 MG capsule TAKE 1 CAPSULE EVERY 8 HOURS AS NEEDED FOR SPASM(S) 180 capsule 1  ? isosorbide mononitrate (IMDUR) 30 MG 24 hr tablet TAKE 1/2 TABLET (15 MG) BY MOUTH DAILY 45 tablet 0  ? LORazepam (ATIVAN) 1 MG tablet Take 0.25-0.5 mg by mouth at bedtime.    ? losartan (COZAAR) 25 MG tablet Take 0.5 tablets (12.5 mg total) by mouth 2 (two) times daily. 180 tablet 3  ? magnesium oxide (MAG-OX) 400 MG tablet Take 400 mg by mouth daily.    ? mometasone (ASMANEX) 220 MCG/INH inhaler Inhale 2 puffs into the lungs daily as needed (asthma).    ? montelukast (SINGULAIR) 10 MG tablet Take 1 tablet (10 mg total) by mouth at bedtime. PLEASE SCHEDULE APPOINTMENT. 30 tablet 0  ? pantoprazole (PROTONIX) 20 MG tablet TAKE 1 TABLET TWICE DAILY 180 tablet 1  ? Polyethyl Glycol-Propyl Glycol (SYSTANE OP) Place 2-3 drops into both eyes 3 (three) times daily as needed (dry eyes).    ? potassium chloride (KLOR-CON) 10 MEQ tablet TAKE 1/2 TABLET ON MONDAY, WEDNESDAY AND FRIDAY . CAN TAKE EXTRA AS NEEDED 45 tablet 3  ? Psyllium (METAMUCIL) 48.57 % POWD Take 1 Dose by mouth daily as needed (regularity).    ? simvastatin (ZOCOR) 40 MG tablet TAKE 1 TABLET EVERY DAY (NEED MD APPOINTMENT FOR REFILLS) 90 tablet 3  ? spironolactone (ALDACTONE) 25 MG tablet TAKE 1 TABLET EVERY DAY 90 tablet 3  ? tamsulosin (FLOMAX) 0.4 MG CAPS capsule Take 0.4 mg by mouth daily.    ? torsemide (DEMADEX) 20 MG tablet TAKE 1 TABLET BY MOUTH AS DIRECTED ON MONDAY,  WEDNESDAY, FRIDAY AND SATURDAY 52 tablet 2  ? Vitamins-Lipotropics (LIPO FLAVONOID PLUS) TABS Take 3 tablets by mouth daily in the afternoon.    ? warfarin (COUMADIN) 5 MG tablet TAKE 1/2 TABLET TO 1 TABLET DAILY AS DIRECTED BY THE COUMADIN CLINIC 90 tablet 1  ? ?No current facility-administered medications for this encounter.  ? ?Allergies:    ?Allergies  ?Allergen Reactions  ? Amoxicillin Rash and Other (See Comments)  ?  Has patient had a PCN reaction causing  immediate rash, facial/tongue/throat swelling, SOB or lightheadedness with hypotension: Yes ?Has patient had a PCN reaction causing severe rash involving mucus membranes or skin necrosis: No ?Has patient had a PCN reaction that required hospitalization: No ?Has patient had a PCN reaction occurring within the last 10 years: No ?If all of the above answers are "NO", then may proceed with Cephalosporin use. ?  ? Penicillins Rash  ?  Has patient had a PCN reaction causing immediate rash, facial/tongue/throat swelling, SOB or lightheadedness with hypotension: Yes ?Has patient had a PCN reaction causing severe rash involving mucus membranes or skin necrosis: No ?Has patient had a PCN reaction that required hospitalization: No ?Has patient had a PCN reaction occurring within the last 10 years: No ?If all of the above answers are "NO", then may proceed with Cephalosporin use. ?  ? ?Social History:  The patient  reports that he has never smoked. He has never used smokeless tobacco. He reports current alcohol use of about 2.0 standard drinks per week. He reports that he does not use drugs.  ? ?Family history:   ?Family History  ?Problem Relation Age of Onset  ? Heart disease Father   ?     questionable  ? Stroke Mother   ? Heart failure Sister   ? Healthy Brother   ? Healthy Sister   ? Parkinsonism Brother   ? Colon cancer Neg Hx   ? ?Review of systems complete and found to be negative unless listed in HPI.   ? ?PHYSICAL EXAM: ?BP 106/70   Pulse 71   Wt 57.1 kg (125  lb 12.8 oz)   SpO2 99%   BMI 20.93 kg/m?  ? ?Wt Readings from Last 3 Encounters:  ?03/29/21 57.1 kg (125 lb 12.8 oz)  ?02/21/21 55.5 kg (122 lb 6.4 oz)  ?01/05/21 57.6 kg (127 lb)  ? ?General:  Elderly frail.

## 2021-03-29 NOTE — H&P (View-Only) (Signed)
Patient ID: [name], male   DOB: 07/07/1940, 81 y.o.   MRN: [id number] ? ?  ?Advanced Heart Failure Clinic Note  ? ?Date:  03/29/2021  ? ?ID:  [name], DOB 1940-07-14, MRN [id number] ? ?PCP:  [name], MD  ?Primary Cardiologist:  [name] ?Referring: Dr. [name] ?HF Cardiologist: Dr. [name] ?  ?History of Present Illness: ?[name] is a 81 y.o. 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 In September 1998 he underwent CABG after an unsuccessful attempt at stenting of his proximal LAD by [name]. EF was 25-30%. He is s/p ICD with upgrade to CRT-D.  ? ?Myoview 11/13 showed a large area of scar in the LAD territory (extent 44%) involving the mid to apical anterior, apical and infero-apical to mid infero-septal and apical lateral wall without associated ischemia. ? ?Admitted 5/16 atrial fibrillation and started on Tikosyn.  He was enrolled in the genetic AF trial (bucindolol vs Toprol). He did poorly in the trial in the setting of titration of beta-blocker. He was referred to HF Clinic. ? ?Echo 4/21 EF 20% LV markedly dilated. Severe MR  RV moderately reduced.  ? ?Echo reviewed with Dr. Copper regarding MitraClip and LV failure felt to be too advanced. ? ?Had mild COVID in April 2022.  ? ?f/u 09/22. Volume looked okay. No changes made. ? ?Saw GI 10/20/20. Completing further workup with [name] for ongoing abdominal pain. CT A/P obtained - incidentally noted moderate to large right pleural effusion. Subsequent CT chest with small pleural effusion. ? ?Seen in ED 11/27/20 for AFL, with rate in 110s. Of note, on course of antibiotics for UTI. He was stable and discharged home and referred for close f/u.  ? ?Follow up 11/28/20, remained in AFL, felt bad. DCCV arranged 11/29/20, AFL--> NSR. ? 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 ?Returns for f/u with his wife and  son. Continues to feel weak. SOB with any activity. + orthopnea. Taking torsemide 20 mg 1 tablet on Mon, Wed, Friday and Saturday. Optivol reviewed mildly elevated from baseline but well under threshold. ReDS 42%. Feels anxious over his health issues. Taking Ativan to help sleep at night. ? ?CPX 5/21 ? ?FVC 3.17 (89%)      ?FEV1 2.35 (88%)        ?FEV1/FVC 74 (98%)        ?MVV 71 (66%)  ? ?Resting HR: 69 Standing HR: 72 Peak HR: 105   (74% age predicted max HR)  ?BP rest: 104/58 Standing BP: 90/56 BP peak: 112/60  ?Peak VO2: 15.5 (66% predicted peak VO2)  ?VE/VCO2 slope:  43  ?OUES: 1.12  ?Peak RER: 1.11  ? ?Ventilatory Threshold: 13.6 (57% predicted or measured peak VO2)  ?VE/MVV:  71%  ?O2pulse:  9   (90% predicted O2pulse)  ? ?Echo 04/04/17 EF 20% with mild to mod reduction RV function.  ? ?CPX 02/17/16 ?Resting HR: 73 Peak HR: 108   (75% age predicted max HR) ?BP rest: 110/58 BP peak: 150/56 ?Peak VO2: 14.4 (58% predicted peak VO2) ?VE/VCO2 slope:  33 ?OUES: 1.32 ?Peak RER: 1.10 ?PETCO2 at peak:  33 ?O2pulse:  9   (82% predicted O2pulse) ? ?Echo 12/17 EF 15-20%, Moderate MR, RV with reduced function ?Echo 5/17 EF 20% RV mildly down.  ? ? ?Past Medical History:  ?Diagnosis Date  ? Acute on chronic systolic CHF (  congestive heart failure) ([place]) 06/09/2015  ? Automatic implantable cardiac defibrillator in situ 2002; 2010  ? medtronic virtuso  ? Cardiomyopathy, ischemic 2011  ? with EF 25-35% by echo  ? Coronary artery disease   ? Hx MI 1992, CABG 1998 , Nuc study 11.2013 large scar but no ischemia  ? GERD (gastroesophageal reflux disease)   ? H/O myocardial infarction, greater than 8 weeks 1992  ? large ant wall injury  ? NSVT (nonsustained ventricular tachycardia)   ? Paroxysmal atrial fibrillation (HCC)   ? ?Current Outpatient Medications  ?Medication Sig Dispense Refill  ? acetaminophen (TYLENOL) 650 MG CR tablet Take 650 mg by mouth 2 (two) times daily.     ? allopurinol (ZYLOPRIM) 300 MG tablet Take 300 mg by mouth  daily.    ? benzonatate (TESSALON) 100 MG capsule Take 100 mg by mouth 3 (three) times daily as needed for cough.    ? bisoprolol (ZEBETA) 5 MG tablet TAKE 1/2 TABLET EVERY DAY 45 tablet 3  ? cetirizine (ZYRTEC) 10 MG tablet Take 10 mg by mouth daily.    ? cholecalciferol (VITAMIN D3) 25 MCG (1000 UNIT) tablet Take 1,000 Units by mouth daily.    ? dapagliflozin propanediol (FARXIGA) 10 MG TABS tablet Take 1 tablet (10 mg total) by mouth daily before breakfast. 90 tablet 3  ? dicyclomine (BENTYL) 10 MG capsule TAKE 1 CAPSULE EVERY 8 HOURS AS NEEDED FOR SPASM(S) 180 capsule 1  ? isosorbide mononitrate (IMDUR) 30 MG 24 hr tablet TAKE 1/2 TABLET (15 MG) BY MOUTH DAILY 45 tablet 0  ? LORazepam (ATIVAN) 1 MG tablet Take 0.25-0.5 mg by mouth at bedtime.    ? losartan (COZAAR) 25 MG tablet Take 0.5 tablets (12.5 mg total) by mouth 2 (two) times daily. 180 tablet 3  ? magnesium oxide (MAG-OX) 400 MG tablet Take 400 mg by mouth daily.    ? mometasone (ASMANEX) 220 MCG/INH inhaler Inhale 2 puffs into the lungs daily as needed (asthma).    ? montelukast (SINGULAIR) 10 MG tablet Take 1 tablet (10 mg total) by mouth at bedtime. PLEASE SCHEDULE APPOINTMENT. 30 tablet 0  ? pantoprazole (PROTONIX) 20 MG tablet TAKE 1 TABLET TWICE DAILY 180 tablet 1  ? Polyethyl Glycol-Propyl Glycol (SYSTANE OP) Place 2-3 drops into both eyes 3 (three) times daily as needed (dry eyes).    ? potassium chloride (KLOR-CON) 10 MEQ tablet TAKE 1/2 TABLET ON MONDAY, WEDNESDAY AND FRIDAY . CAN TAKE EXTRA AS NEEDED 45 tablet 3  ? Psyllium (METAMUCIL) 48.57 % POWD Take 1 Dose by mouth daily as needed (regularity).    ? simvastatin (ZOCOR) 40 MG tablet TAKE 1 TABLET EVERY DAY (NEED MD APPOINTMENT FOR REFILLS) 90 tablet 3  ? spironolactone (ALDACTONE) 25 MG tablet TAKE 1 TABLET EVERY DAY 90 tablet 3  ? tamsulosin (FLOMAX) 0.4 MG CAPS capsule Take 0.4 mg by mouth daily.    ? torsemide (DEMADEX) 20 MG tablet TAKE 1 TABLET BY MOUTH AS DIRECTED ON MONDAY,  WEDNESDAY, FRIDAY AND SATURDAY 52 tablet 2  ? Vitamins-Lipotropics (LIPO FLAVONOID PLUS) TABS Take 3 tablets by mouth daily in the afternoon.    ? warfarin (COUMADIN) 5 MG tablet TAKE 1/2 TABLET TO 1 TABLET DAILY AS DIRECTED BY THE COUMADIN CLINIC 90 tablet 1  ? ?No current facility-administered medications for this encounter.  ? ?Allergies:    ?Allergies  ?Allergen Reactions  ? Amoxicillin Rash and Other (See Comments)  ?  Has patient had a PCN reaction causing  immediate rash, facial/tongue/throat swelling, SOB or lightheadedness with hypotension: Yes ?Has patient had a PCN reaction causing severe rash involving mucus membranes or skin necrosis: No ?Has patient had a PCN reaction that required hospitalization: No ?Has patient had a PCN reaction occurring within the last 10 years: No ?If all of the above answers are "NO", then may proceed with Cephalosporin use. ?  ? Penicillins Rash  ?  Has patient had a PCN reaction causing immediate rash, facial/tongue/throat swelling, SOB or lightheadedness with hypotension: Yes ?Has patient had a PCN reaction causing severe rash involving mucus membranes or skin necrosis: No ?Has patient had a PCN reaction that required hospitalization: No ?Has patient had a PCN reaction occurring within the last 10 years: No ?If all of the above answers are "NO", then may proceed with Cephalosporin use. ?  ? ?Social History:  The patient  reports that he has never smoked. He has never used smokeless tobacco. He reports current alcohol use of about 2.0 standard drinks per week. He reports that he does not use drugs.  ? ?Family history:   ?Family History  ?Problem Relation Age of Onset  ? Heart disease Father   ?     questionable  ? Stroke Mother   ? Heart failure Sister   ? Healthy Brother   ? Healthy Sister   ? Parkinsonism Brother   ? Colon cancer Neg Hx   ? ?Review of systems complete and found to be negative unless listed in HPI.   ? ?PHYSICAL EXAM: ?BP 106/70   Pulse 71   Wt 57.1 kg (125  lb 12.8 oz)   SpO2 99%   BMI 20.93 kg/m?  ? ?Wt Readings from Last 3 Encounters:  ?03/29/21 57.1 kg (125 lb 12.8 oz)  ?02/21/21 55.5 kg (122 lb 6.4 oz)  ?01/05/21 57.6 kg (127 lb)  ? ?General:  Elderly frail.

## 2021-03-29 NOTE — Progress Notes (Signed)
ReDS Vest / Clip - 03/29/21 1200   ? ?  ? ReDS Vest / Clip  ? Station Marker C   ? Ruler Value 24   ? ReDS Value Range High volume overload   ? ReDS Actual Value 42   ? Anatomical Comments sitting   ? ?  ?  ? ?  ? ? ?

## 2021-03-29 NOTE — Patient Instructions (Signed)
Medication Changes: ? ?Start Metolazone 2.5 mg every Wednesday. Take an extra 40 meq of potassium with it (4Tabs) ? ?Lab Work: ? ?Labs done today, your results will be available in MyChart, we will contact you for abnormal readings. ? ? ?Testing/Procedures: ? ?none ? ?Referrals: ? ?none ? ?Special Instructions // Education: ? ?none ? ?Follow-Up in: 3 weeks ? ?At the [facility], you and your health needs are our priority. We have a designated team specialized in the treatment of Heart Failure. This Care Team includes your primary Heart Failure Specialized Cardiologist (physician), Advanced Practice Providers (APPs- Physician Assistants and Nurse Practitioners), and Pharmacist who all work together to provide you with the care you need, when you need it.  ? ?You may see any of the following providers on your designated Care Team at your next follow up: ? ?Dr Glori Bickers ?Dr Loralie Champagne ?Darrick Grinder, NP ?Lyda Jester, PA ?[name] Milford,NP ?Marlyce Huge, PA ?Audry Riles, PharmD ? ? ?Please be sure to bring in all your medications bottles to every appointment.  ? ?Need to Contact us: ? ?If you have any questions or concerns before your next appointment please send [name] a message through [place] or call our office at [phone].   ? ?TO LEAVE A MESSAGE FOR THE NURSE SELECT OPTION 2, PLEASE LEAVE A MESSAGE INCLUDING: ?YOUR NAME ?DATE OF BIRTH ?CALL BACK NUMBER ?REASON FOR CALL**this is important as we prioritize the call backs ? ?YOU WILL RECEIVE A CALL BACK THE SAME DAY AS LONG AS YOU CALL BEFORE 4:00 PM ? ? ?

## 2021-04-04 DIAGNOSIS — R972 Elevated prostate specific antigen [PSA]: Secondary | ICD-10-CM | POA: Diagnosis not present

## 2021-04-11 DIAGNOSIS — N323 Diverticulum of bladder: Secondary | ICD-10-CM | POA: Diagnosis not present

## 2021-04-13 ENCOUNTER — Telehealth (HOSPITAL_COMMUNITY): Payer: Self-pay | Admitting: *Deleted

## 2021-04-13 NOTE — Telephone Encounter (Signed)
Pt called stating his resting heart rate today has been in the 100s.  Pt said he feels slightly light headed and has some shortness of breath with exertion. Pt asked that I report  this to [place].  ? ?Routed to [place]  ?

## 2021-04-14 NOTE — Telephone Encounter (Signed)
Full report at Dr.Bensimhon's desk.  ?

## 2021-04-14 NOTE — Telephone Encounter (Signed)
Pt submitted transmission and said his current heart rate is 84.  ?

## 2021-04-17 ENCOUNTER — Ambulatory Visit (HOSPITAL_COMMUNITY): Payer: Medicare HMO | Admitting: Anesthesiology

## 2021-04-17 ENCOUNTER — Ambulatory Visit (HOSPITAL_COMMUNITY)
Admission: AD | Admit: 2021-04-17 | Discharge: 2021-04-17 | Disposition: A | Discharge status: Home / Self Care | Payer: Medicare HMO | Source: Ambulatory Visit | Attending: Internal Medicine | Admitting: Internal Medicine

## 2021-04-17 ENCOUNTER — Other Ambulatory Visit: Payer: Self-pay

## 2021-04-17 ENCOUNTER — Telehealth: Payer: Self-pay

## 2021-04-17 ENCOUNTER — Encounter (HOSPITAL_COMMUNITY): Payer: Self-pay | Admitting: Internal Medicine

## 2021-04-17 ENCOUNTER — Encounter (HOSPITAL_COMMUNITY)
Admission: AD | Disposition: A | Discharge status: Home / Self Care | Payer: Self-pay | Source: Ambulatory Visit | Attending: Internal Medicine

## 2021-04-17 ENCOUNTER — Other Ambulatory Visit (HOSPITAL_COMMUNITY): Payer: Self-pay | Admitting: Internal Medicine

## 2021-04-17 ENCOUNTER — Telehealth (HOSPITAL_COMMUNITY): Payer: Self-pay | Admitting: *Deleted

## 2021-04-17 DIAGNOSIS — I4891 Unspecified atrial fibrillation: Secondary | ICD-10-CM | POA: Diagnosis not present

## 2021-04-17 DIAGNOSIS — Z538 Procedure and treatment not carried out for other reasons: Secondary | ICD-10-CM | POA: Diagnosis not present

## 2021-04-17 LAB — POCT I-STAT, CHEM 8
BUN: 21 mg/dL (ref 8–23)
Calcium, Ion: 1.23 mmol/L (ref 1.15–1.40)
Chloride: 98 mmol/L (ref 98–111)
Creatinine, Ser: 0.9 mg/dL (ref 0.61–1.24)
Glucose, Bld: 93 mg/dL (ref 70–99)
HCT: 50 % (ref 39.0–52.0)
Hemoglobin: 17 g/dL (ref 13.0–17.0)
Potassium: 4 mmol/L (ref 3.5–5.1)
Sodium: 136 mmol/L (ref 135–145)
TCO2: 28 mmol/L (ref 22–32)

## 2021-04-17 LAB — PROTIME-INR
INR: 1.8 — ABNORMAL HIGH (ref 0.8–1.2)
Prothrombin Time: 20.9 seconds — ABNORMAL HIGH (ref 11.4–15.2)

## 2021-04-17 SURGERY — CANCELLED PROCEDURE

## 2021-04-17 MED ORDER — SODIUM CHLORIDE 0.9 % IV SOLN: INTRAVENOUS | Status: DC

## 2021-04-17 NOTE — Anesthesia Preprocedure Evaluation (Addendum)
Anesthesia Evaluation  ?Patient identified by MRN, date of birth, ID band ?Patient awake ? ? ? ?Reviewed: ?Allergy & Precautions, NPO status , Patient's Chart, lab work & pertinent test results, reviewed documented beta blocker date and time  ? ?Airway ?Mallampati: II ? ?TM Distance: >3 FB ?Neck ROM: Full ? ? ? Dental ?no notable dental hx. ?(+) Teeth Intact, Dental Advisory Given ?  ?Pulmonary ?asthma , COPD,  COPD inhaler,  ?  ?Pulmonary exam normal ?breath sounds clear to auscultation ? ? ? ? ? ? Cardiovascular ?hypertension, Pt. on medications and Pt. on home beta blockers ?pulmonary hypertension (mod pHTN)+ CAD, + Past MI (1992), + CABG (1998) and +CHF (LVEF 20%, moderate RV failure)  ?Normal cardiovascular exam+ dysrhythmias (coumadin) Atrial Fibrillation and Supra Ventricular Tachycardia + Cardiac Defibrillator (last gen change w/ biV upgrade 2018) ?+ Valvular Problems/Murmurs (severe MR, mod-severe TR and AI) MR and AI  ?Rhythm:Irregular Rate:Normal ? ?Echo 2021 ?1. Left ventricular ejection fraction, by estimation, is 20%. The left  ?ventricle has severely decreased function. The left ventricle demonstrates  ?global hypokinesis. The left ventricular internal cavity size was severely  ?dilated. Left ventricular  ?diastolic function could not be evaluated.  ??2. Right ventricular systolic function is moderately reduced. The right  ?ventricular size is mildly enlarged. There is moderately elevated  ?pulmonary artery systolic pressure.  ??3. Left atrial size was severely dilated.  ??4. Right atrial size was severely dilated.  ??5. Mitral valve leaflet tenting due to left ventricular dilation and  ?dysfunction. The mitral valve is abnormal. Severe mitral valve  ?regurgitation.  ??6. Tricuspid valve regurgitation is moderate to severe.  ??7. The aortic valve is tricuspid. Aortic valve regurgitation is moderate  ?to severe.  ??8. The inferior vena cava is dilated in size with <50%  respiratory  ?variability, suggesting right atrial pressure of 15 mmHg.  ?  ?Neuro/Psych ?negative neurological ROS ? negative psych ROS  ? GI/Hepatic ?Neg liver ROS, GERD  Controlled and Medicated,  ?Endo/Other  ?negative endocrine ROS ? Renal/GU ?negative Renal ROS  ?negative genitourinary ?  ?Musculoskeletal ? ?(+) Arthritis , Osteoarthritis,   ? Abdominal ?  ?Peds ? Hematology ?negative hematology ROS ?(+)   ?Anesthesia Other Findings ? ? Reproductive/Obstetrics ?negative OB ROS ? ?  ? ? ? ? ? ? ? ? ? ? ? ? ? ?  ?  ? ? ? ? ? ? ? ?Anesthesia Physical ?Anesthesia Plan ? ?ASA: 4 ? ?Anesthesia Plan: General  ? ?Post-op Pain Management:   ? ?Induction: Intravenous ? ?PONV Risk Score and Plan: TIVA and Treatment may vary due to age or medical condition ? ?Airway Management Planned: Natural Airway and Mask ? ?Additional Equipment: None ? ?Intra-op Plan:  ? ?Post-operative Plan:  ? ?Informed Consent: I have reviewed the patients History and Physical, chart, labs and discussed the procedure including the risks, benefits and alternatives for the proposed anesthesia with the patient or authorized representative who has indicated his/her understanding and acceptance.  ? ? ? ?Dental advisory given ? ?Plan Discussed with: CRNA ? ?Anesthesia Plan Comments: (INR subtherapeutic, case cancelled in preop )  ? ? ? ? ?Anesthesia Quick Evaluation ? ?

## 2021-04-17 NOTE — Telephone Encounter (Signed)
I spoke to the patient who was scheduled a Cardioversion 3/20, unknown to [name].  His INR was 1.8 and procedure was cancelled.  Rescheduled for 3/31 and will follow weekly INRs, starting this Friday, 3/24.  I told him to take 2 tablets tonight and we will check INR Friday.  Verbalized understanding. ?

## 2021-04-17 NOTE — Interval H&P Note (Signed)
History and Physical Interval Note: ? ?04/17/2021 ?1:10 PM ? ?[name]  has presented today for surgery, with the diagnosis of afib.  The various methods of treatment have been discussed with the patient and family. After consideration of risks, benefits and other options for treatment, the patient has consented to  Procedure(s): ?CARDIOVERSION (N/A) as a surgical intervention.  The patient's history has been reviewed, patient examined, no change in status, stable for surgery.  I have reviewed the patient's chart and labs.  Questions were answered to the patient's satisfaction.   ? ? ?Nilda Keathley ? ? ?

## 2021-04-17 NOTE — Telephone Encounter (Signed)
Per Dr.Bensimhon pt is scheduled for DCCV today at 2:30pm. Pt is aware and agreeable with plan.  ?

## 2021-04-17 NOTE — Telephone Encounter (Signed)
Pt was here today for DCCV but his INR was 1.8 Dr.Bensimhon needs his INR 2.0 and will r/s DCCV for 3/31. Per Dr.Bensimhon pt needs to get in with coumadin clinic to get INR to goal. ? ?Routed to coumadin clinic  ?

## 2021-04-17 NOTE — Progress Notes (Signed)
Cardioversion procedure canceled due to INR of 1.8.  Dr [name] and Leanna Sato [facility] pager rep) attempted to pace pt out of his abnormal rhythm but this was unsuccessful. ?

## 2021-04-17 NOTE — Progress Notes (Signed)
Patient ID: [name], male   Jason: [birth year], 81 y.o.   Jason: [id number] ? ?  ?Advanced Heart Failure Clinic Note  ? ?Date:  04/21/2021  ? ?ID:  [name], [name], [name] ? ?PCP:  [name], MD  ?Primary Cardiologist:  [name] ?Referring: Dr. [name] ?HF Cardiologist: Dr. [name] ?  ?History of Present Illness: ?OLAN KUREK is a 81 y.o. 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 In September 1998 he underwent CABG after an unsuccessful attempt at stenting of his proximal LAD by [name]. EF was 25-30%. He is s/p ICD with upgrade to CRT-D.  ? ?Myoview 11/13 showed a large area of scar in the LAD territory (extent 44%) involving the mid to apical anterior, apical and infero-apical to mid infero-septal and apical lateral wall without associated ischemia. ? ?Admitted 5/16 atrial fibrillation and started on Tikosyn.  He was enrolled in the genetic AF trial (bucindolol vs Toprol). He did poorly in the trial in the setting of titration of beta-blocker. He was referred to HF Clinic. ? ?Echo 4/21 EF 20% LV markedly dilated. Severe MR  RV moderately reduced.  ? ?Echo reviewed with Dr. Copper regarding MitraClip and LV failure felt to be too advanced. ? ?Had mild COVID in April 2022.  ? ?f/u 09/22. Volume looked okay. No changes made. ? ?Saw GI 10/20/20. Completing further workup with [name] for ongoing abdominal pain. CT A/P obtained - incidentally noted moderate to large right pleural effusion. Subsequent CT chest with small pleural effusion. ? ?Seen in ED 11/27/20 for AFL, with rate in 110s. Of note, on course of antibiotics for UTI. He was stable and discharged home and referred for close f/u.  ? ?Follow up 11/28/20, remained in AFL, felt bad. DCCV arranged 11/29/20, AFL--> NSR. ? 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 ? ?Follow up 3/23, volume up.  Metolazone 2.5 added q/ Wednesday and discussed addition of Palliative services. Several days later, device interrogation showed AF. Arranged for DCCV, but cancelled due to INR of 1.8. Unsuccessful attempt to pace patient out of rhythm.  ? ?Today he returns for HF follow up with his wife. Overall feeling ok, breathing is "OK", no significant dyspnea walking on flat ground if he takes his time. Wife notices he is more weak, sleeping a lot. Occasional dizziness, no falls. Denies CP, abnormal bleeding, palpitations, edema, or PND/Orthopnea. Appetite poor. No fever or chills. Weight at home 121-124 pounds. Taking all medications.  ? ? ?CPX 5/21 ? ?FVC 3.17 (89%)      ?FEV1 2.35 (88%)        ?FEV1/FVC 74 (98%)        ?MVV 71 (66%)  ? ?Resting HR: 69 Standing HR: 72 Peak HR: 105   (74% age predicted max HR)  ?BP rest: 104/58 Standing BP: 90/56 BP peak: 112/60  ?Peak VO2: 15.5 (66% predicted peak VO2)  ?VE/VCO2 slope:  43  ?OUES: 1.12  ?Peak RER: 1.11  ? ?Ventilatory Threshold: 13.6 (57% predicted or measured peak VO2)  ?VE/MVV:  71%  ?O2pulse:  9   (90% predicted O2pulse)  ? ?- Echo 04/04/17 EF 20% with mild to mod reduction RV function.  ? ?- CPX 02/17/16 ?Resting HR: 73 Peak HR: 108   (75% age predicted max HR) ?BP rest: 110/58 BP peak: 150/56 ?Peak VO2: 14.4 (58% predicted peak VO2) ?VE/VCO2  slope:  33 ?OUES: 1.32 ?Peak RER: 1.10 ?PETCO2 at peak:  33 ?O2pulse:  9   (82% predicted O2pulse) ? ?- Echo 12/17 EF 15-20%, Moderate MR, RV with reduced function. ? ?- Echo 5/17 EF 20% RV mildly down.  ? ? ?Past Medical History:  ?Diagnosis Date  ? Acute on chronic systolic CHF (congestive heart failure) ([place]) 06/09/2015  ? Automatic implantable cardiac defibrillator in situ 2002; 2010  ? medtronic virtuso  ? Cardiomyopathy, ischemic 2011  ? with EF 25-35% by echo  ? Coronary artery disease   ? Hx MI 1992, CABG 1998 , Nuc study 11.2013 large scar but no ischemia  ? GERD (gastroesophageal reflux disease)   ? H/O myocardial infarction,  greater than 8 weeks 1992  ? large ant wall injury  ? NSVT (nonsustained ventricular tachycardia)   ? Paroxysmal atrial fibrillation (HCC)   ? ?Current Outpatient Medications  ?Medication Sig Dispense Refill  ? acetaminophen (TYLENOL) 650 MG CR tablet Take 650 mg by mouth 2 (two) times daily.     ? allopurinol (ZYLOPRIM) 300 MG tablet Take 300 mg by mouth daily.    ? benzonatate (TESSALON) 100 MG capsule Take 100 mg by mouth 3 (three) times daily as needed for cough.    ? bisoprolol (ZEBETA) 5 MG tablet TAKE 1/2 TABLET EVERY DAY 45 tablet 3  ? cetirizine (ZYRTEC) 10 MG tablet Take 10 mg by mouth daily.    ? cholecalciferol (VITAMIN D3) 25 MCG (1000 UNIT) tablet Take 1,000 Units by mouth daily.    ? dapagliflozin propanediol (FARXIGA) 10 MG TABS tablet Take 1 tablet (10 mg total) by mouth daily before breakfast. 90 tablet 3  ? dicyclomine (BENTYL) 10 MG capsule TAKE 1 CAPSULE EVERY 8 HOURS AS NEEDED FOR SPASM(S) 180 capsule 1  ? isosorbide mononitrate (IMDUR) 30 MG 24 hr tablet TAKE 1/2 TABLET (15 MG) BY MOUTH DAILY 45 tablet 0  ? LORazepam (ATIVAN) 1 MG tablet Take 0.25-0.5 mg by mouth at bedtime.    ? magnesium oxide (MAG-OX) 400 MG tablet Take 400 mg by mouth daily.    ? metolazone (ZAROXOLYN) 2.5 MG tablet Take 1 tablet (2.5 mg total) by mouth once a week. Every Wednesday with 40Meq of potassium (4 Tabs) 20 tablet 2  ? mometasone (ASMANEX) 220 MCG/INH inhaler Inhale 2 puffs into the lungs daily as needed (asthma).    ? montelukast (SINGULAIR) 10 MG tablet Take 1 tablet (10 mg total) by mouth at bedtime. PLEASE SCHEDULE APPOINTMENT. 30 tablet 0  ? pantoprazole (PROTONIX) 20 MG tablet TAKE 1 TABLET TWICE DAILY 180 tablet 1  ? Polyethyl Glycol-Propyl Glycol (SYSTANE OP) Place 2-3 drops into both eyes 3 (three) times daily as needed (dry eyes).    ? potassium chloride (KLOR-CON) 10 MEQ tablet TAKE 1/2 TABLET ON MONDAY, WEDNESDAY AND FRIDAY . CAN TAKE EXTRA AS NEEDED 45 tablet 3  ? Psyllium (METAMUCIL) 48.57 % POWD  Take 1 Dose by mouth daily as needed (regularity).    ? simvastatin (ZOCOR) 40 MG tablet TAKE 1 TABLET EVERY DAY (NEED MD APPOINTMENT FOR REFILLS) 90 tablet 3  ? spironolactone (ALDACTONE) 25 MG tablet TAKE 1 TABLET EVERY DAY 90 tablet 3  ? tamsulosin (FLOMAX) 0.4 MG CAPS capsule Take 0.4 mg by mouth daily.    ? torsemide (DEMADEX) 20 MG tablet TAKE 1 TABLET BY MOUTH AS DIRECTED ON MONDAY, WEDNESDAY, FRIDAY AND SATURDAY 52 tablet 2  ? Vitamins-Lipotropics (LIPO FLAVONOID PLUS) TABS Take 3 tablets by mouth daily  in the afternoon.    ? warfarin (COUMADIN) 5 MG tablet TAKE 1/2 TABLET TO 1 TABLET DAILY AS DIRECTED BY THE COUMADIN CLINIC 90 tablet 1  ? losartan (COZAAR) 25 MG tablet Take 0.5 tablets (12.5 mg total) by mouth at bedtime. 180 tablet 3  ? ?No current facility-administered medications for this encounter.  ? ?Allergies:    ?Allergies  ?Allergen Reactions  ? Amoxicillin Rash and Other (See Comments)  ?  Has patient had a PCN reaction causing immediate rash, facial/tongue/throat swelling, SOB or lightheadedness with hypotension: Yes ?Has patient had a PCN reaction causing severe rash involving mucus membranes or skin necrosis: No ?Has patient had a PCN reaction that required hospitalization: No ?Has patient had a PCN reaction occurring within the last 10 years: No ?If all of the above answers are "NO", then may proceed with Cephalosporin use. ?  ? Penicillins Rash  ?  Has patient had a PCN reaction causing immediate rash, facial/tongue/throat swelling, SOB or lightheadedness with hypotension: Yes ?Has patient had a PCN reaction causing severe rash involving mucus membranes or skin necrosis: No ?Has patient had a PCN reaction that required hospitalization: No ?Has patient had a PCN reaction occurring within the last 10 years: No ?If all of the above answers are "NO", then may proceed with Cephalosporin use. ?  ? ?Social History:  The patient  reports that he has never smoked. He has never used smokeless tobacco.  He reports current alcohol use of about 2.0 standard drinks per week. He reports that he does not use drugs.  ? ?Family history:   ?Family History  ?Problem Relation Age of Onset  ? Heart disease Father   ?

## 2021-04-19 ENCOUNTER — Other Ambulatory Visit (HOSPITAL_COMMUNITY): Payer: Self-pay | Admitting: Cardiovascular Disease

## 2021-04-19 NOTE — Telephone Encounter (Signed)
Warfarin '5mg'$  tablet refill sent on 02/23/2021 for 90 day supply and 1 refill. [name] confirmed that they have received it and it was sent out by February.  ? ?Advised that I received another warfarin request and [name] at [facility] states the refill is too soon and that the pt has another week before they can process sending out more refills and they will let the pt know. Advised will not send this request since 02/23/2021 request is a total of a 6 month supply (including refill). ?

## 2021-04-20 ENCOUNTER — Encounter (HOSPITAL_COMMUNITY): Payer: Self-pay | Admitting: Internal Medicine

## 2021-04-20 ENCOUNTER — Ambulatory Visit (INDEPENDENT_AMBULATORY_CARE_PROVIDER_SITE_OTHER): Payer: Medicare HMO

## 2021-04-20 DIAGNOSIS — I5022 Chronic systolic (congestive) heart failure: Secondary | ICD-10-CM | POA: Diagnosis not present

## 2021-04-21 ENCOUNTER — Ambulatory Visit (INDEPENDENT_AMBULATORY_CARE_PROVIDER_SITE_OTHER): Payer: Medicare HMO

## 2021-04-21 ENCOUNTER — Ambulatory Visit (HOSPITAL_COMMUNITY)
Admission: RE | Admit: 2021-04-21 | Discharge: 2021-04-21 | Disposition: A | Discharge status: Home / Self Care | Payer: Medicare HMO | Source: Ambulatory Visit | Attending: Family Medicine | Admitting: Family Medicine

## 2021-04-21 ENCOUNTER — Encounter (HOSPITAL_COMMUNITY): Payer: Self-pay | Admitting: *Deleted

## 2021-04-21 ENCOUNTER — Other Ambulatory Visit: Payer: Self-pay

## 2021-04-21 ENCOUNTER — Encounter (HOSPITAL_COMMUNITY): Payer: Self-pay

## 2021-04-21 VITALS — BP 84/58 | HR 82 | Ht 65.0 in | Wt 125.4 lb

## 2021-04-21 DIAGNOSIS — Z7984 Long term (current) use of oral hypoglycemic drugs: Secondary | ICD-10-CM | POA: Insufficient documentation

## 2021-04-21 DIAGNOSIS — I2510 Atherosclerotic heart disease of native coronary artery without angina pectoris: Secondary | ICD-10-CM | POA: Insufficient documentation

## 2021-04-21 DIAGNOSIS — Z95810 Presence of automatic (implantable) cardiac defibrillator: Secondary | ICD-10-CM | POA: Diagnosis not present

## 2021-04-21 DIAGNOSIS — R531 Weakness: Secondary | ICD-10-CM | POA: Insufficient documentation

## 2021-04-21 DIAGNOSIS — I480 Paroxysmal atrial fibrillation: Secondary | ICD-10-CM | POA: Diagnosis not present

## 2021-04-21 DIAGNOSIS — I4892 Unspecified atrial flutter: Secondary | ICD-10-CM | POA: Diagnosis not present

## 2021-04-21 DIAGNOSIS — Z7901 Long term (current) use of anticoagulants: Secondary | ICD-10-CM | POA: Insufficient documentation

## 2021-04-21 DIAGNOSIS — F419 Anxiety disorder, unspecified: Secondary | ICD-10-CM | POA: Diagnosis not present

## 2021-04-21 DIAGNOSIS — I5022 Chronic systolic (congestive) heart failure: Secondary | ICD-10-CM | POA: Diagnosis not present

## 2021-04-21 DIAGNOSIS — I447 Left bundle-branch block, unspecified: Secondary | ICD-10-CM | POA: Diagnosis not present

## 2021-04-21 DIAGNOSIS — I340 Nonrheumatic mitral (valve) insufficiency: Secondary | ICD-10-CM

## 2021-04-21 DIAGNOSIS — Z951 Presence of aortocoronary bypass graft: Secondary | ICD-10-CM | POA: Insufficient documentation

## 2021-04-21 DIAGNOSIS — Z5181 Encounter for therapeutic drug level monitoring: Secondary | ICD-10-CM

## 2021-04-21 DIAGNOSIS — I252 Old myocardial infarction: Secondary | ICD-10-CM | POA: Insufficient documentation

## 2021-04-21 DIAGNOSIS — R339 Retention of urine, unspecified: Secondary | ICD-10-CM

## 2021-04-21 DIAGNOSIS — R42 Dizziness and giddiness: Secondary | ICD-10-CM | POA: Insufficient documentation

## 2021-04-21 DIAGNOSIS — I255 Ischemic cardiomyopathy: Secondary | ICD-10-CM | POA: Insufficient documentation

## 2021-04-21 DIAGNOSIS — Z79899 Other long term (current) drug therapy: Secondary | ICD-10-CM | POA: Diagnosis not present

## 2021-04-21 DIAGNOSIS — J90 Pleural effusion, not elsewhere classified: Secondary | ICD-10-CM | POA: Diagnosis not present

## 2021-04-21 LAB — CUP PACEART REMOTE DEVICE CHECK
Battery Remaining Longevity: 26 mo
Battery Voltage: 2.92 V
Brady Statistic RA Percent Paced: 0 %
Brady Statistic RV Percent Paced: 65.88 %
Date Time Interrogation Session: 20230323022605
HighPow Impedance: 46 Ohm
HighPow Impedance: 85 Ohm
Implantable Lead Implant Date: 20010806
Implantable Lead Implant Date: 20180813
Implantable Lead Implant Date: 20180813
Implantable Lead Location: 753858
Implantable Lead Location: 753859
Implantable Lead Location: 753860
Implantable Lead Model: 147
Implantable Lead Model: 5092
Implantable Lead Serial Number: 104581
Implantable Pulse Generator Implant Date: 20180813
Lead Channel Impedance Value: 172.541
Lead Channel Impedance Value: 176 Ohm
Lead Channel Impedance Value: 182.4 Ohm
Lead Channel Impedance Value: 212.8 Ohm
Lead Channel Impedance Value: 218.087
Lead Channel Impedance Value: 304 Ohm
Lead Channel Impedance Value: 399 Ohm
Lead Channel Impedance Value: 418 Ohm
Lead Channel Impedance Value: 456 Ohm
Lead Channel Impedance Value: 475 Ohm
Lead Channel Impedance Value: 589 Ohm
Lead Channel Impedance Value: 608 Ohm
Lead Channel Impedance Value: 646 Ohm
Lead Channel Impedance Value: 665 Ohm
Lead Channel Impedance Value: 665 Ohm
Lead Channel Impedance Value: 703 Ohm
Lead Channel Impedance Value: 703 Ohm
Lead Channel Impedance Value: 722 Ohm
Lead Channel Pacing Threshold Amplitude: 0.625 V
Lead Channel Pacing Threshold Amplitude: 1.5 V
Lead Channel Pacing Threshold Amplitude: 1.625 V
Lead Channel Pacing Threshold Pulse Width: 0.4 ms
Lead Channel Pacing Threshold Pulse Width: 0.4 ms
Lead Channel Pacing Threshold Pulse Width: 0.4 ms
Lead Channel Sensing Intrinsic Amplitude: 14.125 mV
Lead Channel Sensing Intrinsic Amplitude: 14.125 mV
Lead Channel Sensing Intrinsic Amplitude: 2.125 mV
Lead Channel Sensing Intrinsic Amplitude: 2.125 mV
Lead Channel Setting Pacing Amplitude: 1.75 V
Lead Channel Setting Pacing Amplitude: 2 V
Lead Channel Setting Pacing Amplitude: 3 V
Lead Channel Setting Pacing Pulse Width: 0.4 ms
Lead Channel Setting Pacing Pulse Width: 0.4 ms
Lead Channel Setting Sensing Sensitivity: 0.3 mV

## 2021-04-21 LAB — POCT INR: INR: 2.9 (ref 2.0–3.0)

## 2021-04-21 MED ORDER — LOSARTAN POTASSIUM 25 MG PO TABS: 12.5000 mg | ORAL_TABLET | Freq: Every day | ORAL | 3 refills | Status: DC

## 2021-04-21 NOTE — Patient Instructions (Addendum)
Decrease Losartan to 12.5 mg (1/2 tab) Daily AT BEDTIME ? ?When you take Metolazone make sure to take 40 meq (4 tabs) of Potassium ? ?You have been referred to Palliative Care, they will call you to schedule a visit ? ?Cardioversion is scheduled for 3/31 with Dr [name] ? ?Your physician recommends that you schedule a follow-up appointment in: 3 weeks ? ?Do the following things EVERYDAY: ?Weigh yourself in the morning before breakfast. Write it down and keep it in a log. ?Take your medicines as prescribed ?Eat low salt foods--Limit salt (sodium) to 2000 mg per day.  ?Stay as active as you can everyday ?Limit all fluids for the day to less than 2 liters ? ?If you have any questions or concerns before your next appointment please send [name] a message through [place] or call our office at [phone].   ? ?TO LEAVE A MESSAGE FOR THE NURSE SELECT OPTION 2, PLEASE LEAVE A MESSAGE INCLUDING: ?YOUR NAME ?DATE OF BIRTH ?CALL BACK NUMBER ?REASON FOR CALL**this is important as we prioritize the call backs ? ?YOU WILL RECEIVE A CALL BACK THE SAME DAY AS LONG AS YOU CALL BEFORE 4:00 PM ? ?At the [facility], you and your health needs are our priority. As part of our continuing mission to provide you with exceptional heart care, we have created designated Provider Care Teams. These Care Teams include your primary Cardiologist (physician) and Advanced Practice Providers (APPs- Physician Assistants and Nurse Practitioners) who all work together to provide you with the care you need, when you need it.  ? ?You may see any of the following providers on your designated Care Team at your next follow up: ?Dr Glori Bickers ?Dr Loralie Champagne ?Darrick Grinder, NP ?Lyda Jester, PA ?[name] Milford,NP ?Marlyce Huge, PA ?Audry Riles, PharmD ? ? ?Please be sure to bring in all your medications bottles to every appointment.  ? ? ? ?

## 2021-04-21 NOTE — Patient Instructions (Signed)
Continue 0.5 tablet daily except 1 tablet each Monday, Wednesday and Friday.  Repeat INR in 1 week.  Cardioversion 3/31 ?

## 2021-04-21 NOTE — Progress Notes (Signed)
ReDS Vest / Clip - 04/21/21 0900   ? ?  ? ReDS Vest / Clip  ? Station Marker A   ? Ruler Value 22   ? ReDS Value Range Low volume   ? ReDS Actual Value 33   ? ?  ?  ? ?  ? ? ?

## 2021-04-24 ENCOUNTER — Ambulatory Visit (INDEPENDENT_AMBULATORY_CARE_PROVIDER_SITE_OTHER): Payer: Medicare HMO

## 2021-04-24 DIAGNOSIS — Z95810 Presence of automatic (implantable) cardiac defibrillator: Secondary | ICD-10-CM | POA: Diagnosis not present

## 2021-04-26 NOTE — Progress Notes (Signed)
EPIC Encounter for ICM Monitoring ? ?Patient Name: [name] is a 81 y.o. male ?Date: 04/26/2021 ?Primary Care Physican: [name], MD ?Primary Cardiologist: Kelly/Bensimhon ?Electrophysiologist: [name] ?BiV Pacing: 66%  ?03/21/2021 Weight: 125 lbs ?  ?Time in AT/AF 24.0 hr/day (100.0%) (on Coumadin) ?                                               ?  ?Spoke with patient and heart failure questions reviewed.  Pt reports he feels fine and denies any fluid symptoms. ?  ?Optivol thoracic impedance suggesting normal fluid levels.    ?  ?Prescribed:   ?Torsemide 20 mg 1 tablet as directed take Mon, Wed, Friday and Saturday.  Can take extra as needed ?Metolazone 2.5 mg Take 1 tablet (2.5 mg total) by mouth once a week. Every Wednesday with 40Meq of potassium (4 Tabs) ?Potassium 10 mEq take 0.5 tablet three times a week Mon, Wed & Friday.  Can take extra as needed ?Spironolactone 25 mg take 1 tablet daily ?  ?Labs: ?11/27/2020 Creatinine 1.01, BUN 21, Potassium 4.2, Sodium 135 ?10/20/2020 Creatinine 1.02, BUN 21, Potassium 3.4, Sodium 135, GFR 69.16  ?10/06/2020 Creatinine 0.95, BUN 15, Potassium 4.3, Sodium 137  ?06/21/2020 Creatinine 0.86, BUN 17, Potassium 3.6, Sodium 134, GFr >6 ?0A complete set of results can be found in Results Review ?  ?Recommendations:   No changes and encouraged to call if experiencing any fluid symptoms. ?  ?Follow-up plan: ICM clinic phone appointment on 05/29/2021.   91 day device clinic remote transmission 07/20/2021.   ?  ?EP/Cardiology Office Visits:  4/14/023 with Dr [name].  Recall  06/23/2021 with Dr [name].    ?  ?Copy of ICM check sent to Dr. [name].     ? ?3 month ICM trend: 04/24/2021. ? ? ? ?12-14 Month ICM trend:  ? ? ? ?[name], RN ?04/26/2021 ?2:31 PM ? ?

## 2021-04-27 ENCOUNTER — Ambulatory Visit (INDEPENDENT_AMBULATORY_CARE_PROVIDER_SITE_OTHER): Payer: Medicare HMO

## 2021-04-27 ENCOUNTER — Other Ambulatory Visit (HOSPITAL_COMMUNITY): Payer: Self-pay | Admitting: Internal Medicine

## 2021-04-27 DIAGNOSIS — Z5181 Encounter for therapeutic drug level monitoring: Secondary | ICD-10-CM | POA: Diagnosis not present

## 2021-04-27 LAB — POCT INR: INR: 3.6 — AB (ref 2.0–3.0)

## 2021-04-27 NOTE — Progress Notes (Signed)
Attempted to obtain medical history via telephone, unable to reach at this time. I left a voicemail to return pre surgical testing department's phone call.  

## 2021-04-27 NOTE — Patient Instructions (Signed)
HOLD TONIGHT ONLY and then Continue 0.5 tablet daily except 1 tablet each Monday, Wednesday and Friday.  Repeat INR in 2 weeks.  Cardioversion 3/31 ?

## 2021-04-28 ENCOUNTER — Ambulatory Visit (HOSPITAL_COMMUNITY): Payer: Medicare HMO | Admitting: Anesthesiology

## 2021-04-28 ENCOUNTER — Ambulatory Visit (HOSPITAL_BASED_OUTPATIENT_CLINIC_OR_DEPARTMENT_OTHER): Payer: Medicare HMO | Admitting: Anesthesiology

## 2021-04-28 ENCOUNTER — Other Ambulatory Visit: Payer: Self-pay

## 2021-04-28 ENCOUNTER — Ambulatory Visit (HOSPITAL_COMMUNITY)
Admission: RE | Admit: 2021-04-28 | Discharge: 2021-04-28 | Disposition: A | Discharge status: Home / Self Care | Payer: Medicare HMO | Source: Ambulatory Visit | Attending: Internal Medicine | Admitting: Internal Medicine

## 2021-04-28 ENCOUNTER — Ambulatory Visit (HOSPITAL_BASED_OUTPATIENT_CLINIC_OR_DEPARTMENT_OTHER): Payer: Medicare HMO

## 2021-04-28 ENCOUNTER — Encounter (HOSPITAL_COMMUNITY)
Admission: RE | Disposition: A | Discharge status: Home / Self Care | Payer: Self-pay | Source: Ambulatory Visit | Attending: Internal Medicine

## 2021-04-28 ENCOUNTER — Encounter (HOSPITAL_COMMUNITY): Payer: Self-pay | Admitting: Internal Medicine

## 2021-04-28 DIAGNOSIS — I2510 Atherosclerotic heart disease of native coronary artery without angina pectoris: Secondary | ICD-10-CM | POA: Diagnosis not present

## 2021-04-28 DIAGNOSIS — I480 Paroxysmal atrial fibrillation: Secondary | ICD-10-CM | POA: Insufficient documentation

## 2021-04-28 DIAGNOSIS — F419 Anxiety disorder, unspecified: Secondary | ICD-10-CM | POA: Insufficient documentation

## 2021-04-28 DIAGNOSIS — I083 Combined rheumatic disorders of mitral, aortic and tricuspid valves: Secondary | ICD-10-CM | POA: Diagnosis not present

## 2021-04-28 DIAGNOSIS — I5022 Chronic systolic (congestive) heart failure: Secondary | ICD-10-CM | POA: Insufficient documentation

## 2021-04-28 DIAGNOSIS — I351 Nonrheumatic aortic (valve) insufficiency: Secondary | ICD-10-CM

## 2021-04-28 DIAGNOSIS — Z7984 Long term (current) use of oral hypoglycemic drugs: Secondary | ICD-10-CM | POA: Diagnosis not present

## 2021-04-28 DIAGNOSIS — I340 Nonrheumatic mitral (valve) insufficiency: Secondary | ICD-10-CM

## 2021-04-28 DIAGNOSIS — I255 Ischemic cardiomyopathy: Secondary | ICD-10-CM | POA: Insufficient documentation

## 2021-04-28 DIAGNOSIS — R339 Retention of urine, unspecified: Secondary | ICD-10-CM | POA: Insufficient documentation

## 2021-04-28 DIAGNOSIS — I4892 Unspecified atrial flutter: Secondary | ICD-10-CM | POA: Diagnosis not present

## 2021-04-28 DIAGNOSIS — J449 Chronic obstructive pulmonary disease, unspecified: Secondary | ICD-10-CM | POA: Diagnosis not present

## 2021-04-28 DIAGNOSIS — Z7901 Long term (current) use of anticoagulants: Secondary | ICD-10-CM | POA: Diagnosis not present

## 2021-04-28 DIAGNOSIS — I447 Left bundle-branch block, unspecified: Secondary | ICD-10-CM | POA: Diagnosis not present

## 2021-04-28 DIAGNOSIS — I252 Old myocardial infarction: Secondary | ICD-10-CM | POA: Insufficient documentation

## 2021-04-28 DIAGNOSIS — I110 Hypertensive heart disease with heart failure: Secondary | ICD-10-CM

## 2021-04-28 DIAGNOSIS — I4891 Unspecified atrial fibrillation: Secondary | ICD-10-CM

## 2021-04-28 DIAGNOSIS — I088 Other rheumatic multiple valve diseases: Secondary | ICD-10-CM

## 2021-04-28 DIAGNOSIS — E785 Hyperlipidemia, unspecified: Secondary | ICD-10-CM | POA: Diagnosis not present

## 2021-04-28 DIAGNOSIS — I700 Atherosclerosis of aorta: Secondary | ICD-10-CM | POA: Insufficient documentation

## 2021-04-28 DIAGNOSIS — Z79899 Other long term (current) drug therapy: Secondary | ICD-10-CM | POA: Insufficient documentation

## 2021-04-28 HISTORY — PX: CARDIOVERSION: SHX1299

## 2021-04-28 HISTORY — PX: TEE WITHOUT CARDIOVERSION: SHX5443

## 2021-04-28 SURGERY — ECHOCARDIOGRAM, TRANSESOPHAGEAL
Anesthesia: General

## 2021-04-28 MED ORDER — SODIUM CHLORIDE 0.9 % IV SOLN: INTRAVENOUS | Status: DC

## 2021-04-28 MED ORDER — PROPOFOL 500 MG/50ML IV EMUL
INTRAVENOUS | Status: DC | PRN
  Administered 2021-04-28: 75 ug/kg/min via INTRAVENOUS

## 2021-04-28 MED ORDER — PHENYLEPHRINE 40 MCG/ML (10ML) SYRINGE FOR IV PUSH (FOR BLOOD PRESSURE SUPPORT)
PREFILLED_SYRINGE | INTRAVENOUS | Status: DC | PRN
  Administered 2021-04-28 (×8): 40 ug via INTRAVENOUS

## 2021-04-28 MED ORDER — PROPOFOL 10 MG/ML IV BOLUS
INTRAVENOUS | Status: DC | PRN
  Administered 2021-04-28: 10 mg via INTRAVENOUS

## 2021-04-28 MED ORDER — SOD CITRATE-CITRIC ACID 500-334 MG/5ML PO SOLN
30.0000 mL | Freq: Once | ORAL | Status: DC
  Filled 2021-04-28: qty 30

## 2021-04-28 MED ORDER — SOD CITRATE-CITRIC ACID 500-334 MG/5ML PO SOLN
30.0000 mL | Freq: Once | ORAL | Status: AC
  Administered 2021-04-28: 30 mL via ORAL
  Filled 2021-04-28: qty 30

## 2021-04-28 NOTE — Anesthesia Preprocedure Evaluation (Addendum)
Anesthesia Evaluation  ?Patient identified by MRN, date of birth, ID band ?Patient awake ? ? ? ?Reviewed: ?Allergy & Precautions, NPO status , Patient's Chart, lab work & pertinent test results, reviewed documented beta blocker date and time  ? ?Airway ?Mallampati: II ? ?TM Distance: >3 FB ?Neck ROM: Full ? ? ? Dental ?no notable dental hx. ?(+) Teeth Intact, Dental Advisory Given ?  ?Pulmonary ?asthma , COPD,  COPD inhaler,  ?  ?Pulmonary exam normal ?breath sounds clear to auscultation ? ? ? ? ? ? Cardiovascular ?hypertension, Pt. on medications ?pulmonary hypertension (mod pHTN)+ CAD, + Past MI (1992), + CABG (1998) and +CHF (LVEF 20%, moderate RV failure)  ?Normal cardiovascular exam+ dysrhythmias (coumadin) Atrial Fibrillation and Supra Ventricular Tachycardia + Cardiac Defibrillator (last gen change w/ biV upgrade 2018) ?+ Valvular Problems/Murmurs (severe MR, mod-severe TR and AI) MR and AI  ?Rhythm:Irregular Rate:Normal ? ?Hx/o AWMI  1992 Proximal LAD ?S/P CABG 1998 after unsuccessful attempt at PCI ? ?Echo 07/21/2019 ?1. Left ventricular ejection fraction, by estimation, is 20%. The left ventricle has severely decreased function. The left ventricle demonstrates global hypokinesis. The left ventricular internal cavity size was severely dilated. Left ventricular diastolic function could not be evaluated.  ??2. Right ventricular systolic function is moderately reduced. The right ventricular size is mildly enlarged. There is moderately elevated pulmonary artery systolic pressure.  ??3. Left atrial size was severely dilated.  ??4. Right atrial size was severely dilated.  ??5. Mitral valve leaflet tenting due to left ventricular dilation and dysfunction. The mitral valve is abnormal. Severe mitral valve regurgitation.  ??6. Tricuspid valve regurgitation is moderate to severe.  ??7. The aortic valve is tricuspid. Aortic valve regurgitation is moderate to severe.  ??8. The inferior  vena cava is dilated in size with <50% respiratory variability, suggesting right atrial pressure of 15 mmHg.  ? ?EKG 04/21/21 ?Ventricular paced rhythm ?  ?Neuro/Psych ?negative neurological ROS ? negative psych ROS  ? GI/Hepatic ?Neg liver ROS, GERD  Controlled and Medicated,  ?Endo/Other  ?Hyperlipidemia ? Renal/GU ?negative Renal ROS Bladder dysfunction ? ?Hx/o urinary retention ? ?  ?Musculoskeletal ? ?(+) Arthritis , Osteoarthritis,   ? Abdominal ?  ?Peds ? Hematology ?Coumadin therapy INR 3.6 yesterday   ?Anesthesia Other Findings ? ? Reproductive/Obstetrics ?negative OB ROS ? ?  ? ? ? ? ? ? ? ? ? ? ? ? ? ?  ?  ? ? ? ? ? ? ? ? ?Anesthesia Physical ? ?Anesthesia Plan ? ?ASA: 4 ? ?Anesthesia Plan: General  ? ?Post-op Pain Management: Minimal or no pain anticipated  ? ?Induction: Intravenous ? ?PONV Risk Score and Plan: Treatment may vary due to age or medical condition, Propofol infusion and TIVA ? ?Airway Management Planned: Natural Airway and Mask ? ?Additional Equipment: None ? ?Intra-op Plan:  ? ?Post-operative Plan:  ? ?Informed Consent: I have reviewed the patients History and Physical, chart, labs and discussed the procedure including the risks, benefits and alternatives for the proposed anesthesia with the patient or authorized representative who has indicated his/her understanding and acceptance.  ? ? ? ?Dental advisory given ? ?Plan Discussed with: CRNA and Anesthesiologist ? ?Anesthesia Plan Comments:   ? ? ? ? ? ? ?Anesthesia Quick Evaluation ? ?

## 2021-04-28 NOTE — Interval H&P Note (Signed)
History and Physical Interval Note: ? ?04/28/2021 ?8:47 AM ? ?[name]  has presented today for surgery, with the diagnosis of afib.  The various methods of treatment have been discussed with the patient and family. After consideration of risks, benefits and other options for treatment, the patient has consented to  Procedure(s): ?TRANSESOPHAGEAL ECHOCARDIOGRAM (TEE) (N/A) ?CARDIOVERSION (N/A) as a surgical intervention.  The patient's history has been reviewed, patient examined, no change in status, stable for surgery.  I have reviewed the patient's chart and labs.  Questions were answered to the patient's satisfaction.   ? ? ?[name] ? ? ?

## 2021-04-28 NOTE — CV Procedure (Signed)
? ?  TRANSESOPHAGEAL ECHOCARDIOGRAM GUIDED DIRECT CURRENT CARDIOVERSION ? ?NAME:  [name]   MRN: [id number] ?DOB:  April 30, 1940   ADMIT DATE: 04/28/2021 ? ?INDICATIONS: ? ?Atrial fibrillation ? ?PROCEDURE:  ? ?Informed consent was obtained prior to the procedure. The risks, benefits and alternatives for the procedure were discussed and the patient comprehended these risks.  Risks include, but are not limited to, cough, sore throat, vomiting, nausea, somnolence, esophageal and stomach trauma or perforation, bleeding, low blood pressure, aspiration, pneumonia, infection, trauma to the teeth and death.   ? ?After a procedural time-out, the oropharynx was anesthetized and the patient was sedated by the anesthesia service. The transesophageal probe was inserted in the esophagus and stomach without difficulty and multiple views were obtained.  ? ?FINDINGS: ? ?LEFT VENTRICLE: EF = Severely dilated. EF 15% ? ?RIGHT VENTRICLE: Severely HK ? ?LEFT ATRIUM: Severely dilated ? ?LEFT ATRIAL APPENDAGE: No clot ? ?RIGHT ATRIUM: Moderately dilated ? ?AORTIC VALVE:  Trileaflet. Moderate to severe central AI ? ?MITRAL VALVE:    Moderate to severe MR due to annular dilation ? ?TRICUSPID VALVE: Moderate TR ? ?PULMONIC VALVE: Mild PI ? ?INTERATRIAL SEPTUM: No PFO or ASD ? ?PERICARDIUM: Normal ? ?DESCENDING AORTA: Moderate plaque ? ? ?CARDIOVERSION:    ? ?Indications:  Atrial Fibrillation ? ?Procedure Details: ? ?Once the TEE was complete, the patient had the defibrillator pads placed in the anterior and posterior position. Once an appropriate level of sedation was achieved, the patient received a single biphasic, synchronized 120J shock with prompt conversion to sinus rhythm. No apparent complications. ? ? ?Glori Bickers, MD  ?10:02 AM ? ? ?  ?

## 2021-04-28 NOTE — Progress Notes (Signed)
Remote ICD transmission.   

## 2021-04-28 NOTE — Transfer of Care (Signed)
Immediate Anesthesia Transfer of Care Note ? ?Patient: [name] ? ?Procedure(s) Performed: TRANSESOPHAGEAL ECHOCARDIOGRAM (TEE) ?CARDIOVERSION ? ?Patient Location: PACU ? ?Anesthesia Type:MAC ? ?Level of Consciousness: drowsy and patient cooperative ? ?Airway & Oxygen Therapy: Patient Spontanous Breathing and Patient connected to nasal cannula oxygen ? ?Post-op Assessment: Report given to RN, Post -op Vital signs reviewed and stable and Patient moving all extremities X 4 ? ?Post vital signs: Reviewed and stable ? ?Last Vitals:  ?Vitals Value Taken Time  ?BP 161/117 04/28/21 1006  ?Temp    ?Pulse 59 04/28/21 1006  ?Resp 16 04/28/21 1006  ?SpO2 98 % 04/28/21 1006  ?Vitals shown include unvalidated device data. ? ?Last Pain:  ?Vitals:  ? 04/28/21 0839  ?TempSrc: Temporal  ?PainSc: 0-No pain  ?   ? ?  ? ?Complications: No notable events documented. ?

## 2021-04-28 NOTE — Progress Notes (Signed)
?  Echocardiogram ?2D Echocardiogram has been performed. ? ?Roseanna Rainbow R ?04/28/2021, 10:05 AM ?

## 2021-04-29 NOTE — Anesthesia Postprocedure Evaluation (Signed)
Anesthesia Post Note ? ?Patient: [name] ? ?Procedure(s) Performed: TRANSESOPHAGEAL ECHOCARDIOGRAM (TEE) ?CARDIOVERSION ? ?  ? ?Patient location during evaluation: PACU ?Anesthesia Type: General ?Level of consciousness: awake and alert ?Pain management: pain level controlled ?Vital Signs Assessment: post-procedure vital signs reviewed and stable ?Respiratory status: spontaneous breathing, nonlabored ventilation and respiratory function stable ?Cardiovascular status: blood pressure returned to baseline and stable ?Postop Assessment: no apparent nausea or vomiting ?Anesthetic complications: no ? ? ?No notable events documented. ? ?Last Vitals:  ?Vitals:  ? 04/28/21 1020 04/28/21 1035  ?BP: (!) 82/54 (!) 83/53  ?Pulse: 60 62  ?Resp: 20 20  ?Temp:  36.8 ?C  ?SpO2: 98% 100%  ?  ?Last Pain:  ?Vitals:  ? 04/28/21 1035  ?TempSrc:   ?PainSc: 0-No pain  ? ? ?  ?  ?  ?  ?  ?  ? ?Kale Rondeau A. ? ? ? ? ?

## 2021-04-30 ENCOUNTER — Encounter (HOSPITAL_COMMUNITY): Payer: Self-pay | Admitting: Internal Medicine

## 2021-05-03 ENCOUNTER — Telehealth: Payer: Self-pay

## 2021-05-03 NOTE — Telephone Encounter (Signed)
Spoke with patient and patient's spouse Hassan Rowan and scheduled a Mychart Palliative Consult for 05/05/21 @ 1:30 PM.  ? ?Consent obtained; updated Netsmart, Team List and Epic.  ? ? ?

## 2021-05-03 NOTE — Telephone Encounter (Signed)
Attempted to contact patient's spouse Hassan Rowan to schedule a Palliative Care consult appointment. No answer left a message to return call.  ? ?

## 2021-05-05 ENCOUNTER — Telehealth: Payer: Self-pay | Admitting: Family Medicine

## 2021-05-05 LAB — ECHO TEE: P 1/2 time: 320 msec

## 2021-05-05 NOTE — Telephone Encounter (Signed)
Incoming video visit call from patient and his wife.  They explained that he had a recent referral following hospitalization from Dr. [name] for palliative care and requested explanation of what palliative care entails.  Discussed role of palliative care as a supportive adjunct to patient's existing providers.  Advised this was for chronic, incurable conditions that impacted quality of life and caused problematic symptoms for which palliative care assisted the patient and family in managing for improved symptom control and quality of life.  Advised of hours from 8 AM to 5 PM Monday through Friday with an after-hours nurse available after hours on weekends and holidays if symptoms management needed for something that could wait till office hours.  Also explained role of palliative care and advance care planning for things like healthcare power of attorney and MOST form to identify goals of care and desired setting for patient and family.  Patient states he thinks he is doing well currently but did ask if palliative care could obtain INR for Coumadin monitoring and were aware that this could also be done by home health care.  Requested difference between home health care and palliative care with benefits for each.  Explained that home health care has to have a skilled need for nursing or physical therapy to be able to come in and the service need is met and then the patient is discharged.  Explained there is no time limit on palliative care and does not require a skilled need to continue.  Patient and wife asking about costs and payer source.  Advised both that this is covered by Medicare and depending on plan may be 80% covered with 20% being billed to the patient.  Advised a nonprofit status of Manufacturing engineer and that they may be asked if they have to pay the 20% or a sliding scale but are not denied services if unable to pay.  Patient and wife would like to discuss further before making a decision and  request call back in 2 weeks.  This will be scheduled for the afternoon. Damaris Hippo FNP-C ?

## 2021-05-10 NOTE — Progress Notes (Signed)
Patient ID: [name], male   DOB: 08/07/1940, 81 y.o.   MRN: [id number] ? ?  ?Advanced Heart Failure Clinic Note  ? ?Date:  05/11/2021  ? ?ID:  [name], DOB Apr 20, 1940, MRN [id number] ? ?PCP:  [name], MD  ?Primary Cardiologist:  [name] ?Referring: Dr. [name] ?HF Cardiologist: Dr. [name] ?  ?History of Present Illness: ?[name] is a 81 y.o. 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 In September 1998 he underwent CABG after an unsuccessful attempt at stenting of his proximal LAD by [name]. EF was 25-30%. He is s/p ICD with upgrade to CRT-D.  ? ?Myoview 11/13 showed a large area of scar in the LAD territory (extent 44%) involving the mid to apical anterior, apical and infero-apical to mid infero-septal and apical lateral wall without associated ischemia. ? ?Admitted 5/16 atrial fibrillation and started on Tikosyn.  He was enrolled in the genetic AF trial (bucindolol vs Toprol). He did poorly in the trial in the setting of titration of beta-blocker. He was referred to HF Clinic. ? ?Echo 4/21 EF 20% LV markedly dilated. Severe MR  RV moderately reduced.  ? ?Echo reviewed with Dr. Copper regarding MitraClip and LV failure felt to be too advanced. ? ?Had mild COVID in April 2022.  ? ?f/u 09/22. Volume looked okay. No changes made. ? ?Saw GI 10/20/20. Completing further workup with [name] for ongoing abdominal pain. CT A/P obtained - incidentally noted moderate to large right pleural effusion. Subsequent CT chest with small pleural effusion. ? ?Seen in ED 11/27/20 for AFL, with rate in 110s. Of note, on course of antibiotics for UTI. He was stable and discharged home and referred for close f/u.  ? ?Follow up 11/28/20, remained in AFL, felt bad. DCCV arranged 11/29/20, AFL--> NSR. ? 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 ? ?Follow up 3/23, volume up.  Metolazone 2.5 added q/ Wednesday and discussed addition of Palliative services. Several days later, device interrogation showed AF. Arranged for DCCV, but cancelled due to INR of 1.8. Unsuccessful attempt to pace patient out of rhythm.  ? ?On 3/31 had TEE/cardioversion with conversion to SR.  ? ?Had video visit with [facility] for Palliative Care.  ? ?He presents today for HF follow up with his wife.  Complaining of fatigue after taking metolazone. Says he had to empty leg back 4 times. Frustrated wearing continuous foley.  He is trying to determine if he wants  Palliative Care. Denies SOB/PND/Orthopnea. Appetite ok. No fever or chills. Weight at home is < 118 pounds. Taking all medications. ? ?CPX 5/21 ?FVC 3.17 (89%)      ?FEV1 2.35 (88%)        ?FEV1/FVC 74 (98%)        ?MVV 71 (66%)  ? ?Resting HR: 69 Standing HR: 72 Peak HR: 105   (74% age predicted max HR)  ?BP rest: 104/58 Standing BP: 90/56 BP peak: 112/60  ?Peak VO2: 15.5 (66% predicted peak VO2)  ?VE/VCO2 slope:  43  ?OUES: 1.12  ?Peak RER: 1.11  ? ?Ventilatory Threshold: 13.6 (57% predicted or measured peak VO2)  ?VE/MVV:  71%  ?O2pulse:  9   (90% predicted O2pulse)  ? ?- Echo 04/04/17 EF 20% with mild to mod reduction RV function.  ? ?- CPX 02/17/16 ?Resting HR: 73 Peak HR: 108   (75% age predicted  max HR) ?BP rest: 110/58 BP peak: 150/56 ?Peak VO2: 14.4 (58% predicted peak VO2) ?VE/VCO2 slope:  33 ?OUES: 1.32 ?Peak RER: 1.10 ?PETCO2 at peak:  33 ?O2pulse:  9   (82% predicted O2pulse) ? ?- Echo 12/17 EF 15-20%, Moderate MR, RV with reduced function. ? ?- Echo 5/17 EF 20% RV mildly down.  ? ? ?Past Medical History:  ?Diagnosis Date  ? Acute on chronic systolic CHF (congestive heart failure) ([place]) 06/09/2015  ? Automatic implantable cardiac defibrillator in situ 2002; 2010  ? medtronic virtuso  ? Cardiomyopathy, ischemic 2011  ? with EF 25-35% by echo  ? Coronary artery disease   ? Hx MI 1992, CABG 1998 , Nuc study 11.2013 large scar but no ischemia   ? GERD (gastroesophageal reflux disease)   ? H/O myocardial infarction, greater than 8 weeks 1992  ? large ant wall injury  ? NSVT (nonsustained ventricular tachycardia) ([place])   ? Paroxysmal atrial fibrillation ([facility])   ? ?Current Outpatient Medications  ?Medication Sig Dispense Refill  ? acetaminophen (TYLENOL) 650 MG CR tablet Take 650 mg by mouth 2 (two) times daily.     ? allopurinol (ZYLOPRIM) 300 MG tablet Take 300 mg by mouth in the morning.    ? benzonatate (TESSALON) 100 MG capsule Take 100 mg by mouth 3 (three) times daily as needed for cough.    ? bisoprolol (ZEBETA) 5 MG tablet TAKE 1/2 TABLET EVERY DAY 45 tablet 3  ? cetirizine (ZYRTEC) 10 MG tablet Take 10 mg by mouth daily.    ? cholecalciferol (VITAMIN D3) 25 MCG (1000 UNIT) tablet Take 1,000 Units by mouth daily at 12 noon.    ? dapagliflozin propanediol (FARXIGA) 10 MG TABS tablet Take 1 tablet (10 mg total) by mouth daily before breakfast. 90 tablet 3  ? dicyclomine (BENTYL) 10 MG capsule TAKE 1 CAPSULE EVERY 8 HOURS AS NEEDED FOR SPASM(S) (Patient taking differently: Take 10 mg by mouth in the morning, at noon, and at bedtime.) 180 capsule 1  ? isosorbide mononitrate (IMDUR) 30 MG 24 hr tablet TAKE 1/2 TABLET (15 MG) BY MOUTH DAILY 45 tablet 0  ? LORazepam (ATIVAN) 1 MG tablet Take 0.5-1 mg by mouth at bedtime.    ? losartan (COZAAR) 25 MG tablet Take 0.5 tablets (12.5 mg total) by mouth at bedtime. 180 tablet 3  ? magnesium oxide (MAG-OX) 400 MG tablet Take 400 mg by mouth daily at 12 noon.    ? metolazone (ZAROXOLYN) 2.5 MG tablet Take 1 tablet (2.5 mg total) by mouth once a week. Every Wednesday with 40Meq of potassium (4 Tabs) 20 tablet 2  ? mometasone (ASMANEX) 220 MCG/INH inhaler Inhale 2 puffs into the lungs daily as needed (asthma).    ? montelukast (SINGULAIR) 10 MG tablet Take 1 tablet (10 mg total) by mouth at bedtime. PLEASE SCHEDULE APPOINTMENT. 30 tablet 0  ? pantoprazole (PROTONIX) 20 MG tablet TAKE 1 TABLET TWICE DAILY 180 tablet  1  ? Polyethyl Glycol-Propyl Glycol (SYSTANE OP) Place 2-3 drops into both eyes 3 (three) times daily as needed (dry eyes).    ? potassium chloride (KLOR-CON) 10 MEQ tablet Patient takes 1/2 tablet by mouth on Monday Wednesday and Friday and Saturday and also takes 4 tablets on Wednesdays.    ? Psyllium (METAMUCIL) 48.57 % POWD Take 1 packet by mouth daily as needed (regularity).    ? simvastatin (ZOCOR) 40 MG tablet TAKE 1 TABLET EVERY DAY (NEED MD APPOINTMENT FOR REFILLS) 90 tablet 3  ?  spironolactone (ALDACTONE) 25 MG tablet TAKE 1 TABLET EVERY DAY 90 tablet 3  ? tamsulosin (FLOMAX) 0.4 MG CAPS capsule Take 0.4 mg by mouth daily.    ? torsemide (DEMADEX) 20 MG tablet TAKE 1 TABLET BY MOUTH AS DIRECTED ON MONDAY, WEDNESDAY, FRIDAY AND SATURDAY 52 tablet 2  ? Vitamins-Lipotropics (LIPO FLAVONOID PLUS) TABS Take 1 tablet by mouth in the morning, at noon, and at bedtime.    ? warfarin (COUMADIN) 5 MG tablet TAKE 1/2 TABLET TO 1 TABLET DAILY AS DIRECTED BY THE COUMADIN CLINIC 90 tablet 1  ? ?No current facility-administered medications for this encounter.  ? ?Allergies:    ?Allergies  ?Allergen Reactions  ? Amoxicillin Rash and Other (See Comments)  ?  Has patient had a PCN reaction causing immediate rash, facial/tongue/throat swelling, SOB or lightheadedness with hypotension: Yes ?Has patient had a PCN reaction causing severe rash involving mucus membranes or skin necrosis: No ?Has patient had a PCN reaction that required hospitalization: No ?Has patient had a PCN reaction occurring within the last 10 years: No ?If all of the above answers are "NO", then may proceed with Cephalosporin use. ?  ? Penicillins Rash  ?  Has patient had a PCN reaction causing immediate rash, facial/tongue/throat swelling, SOB or lightheadedness with hypotension: Yes ?Has patient had a PCN reaction causing severe rash involving mucus membranes or skin necrosis: No ?Has patient had a PCN reaction that required hospitalization: No ?Has  patient had a PCN reaction occurring within the last 10 years: No ?If all of the above answers are "NO", then may proceed with Cephalosporin use. ?  ? ?Social History:  The patient  reports that he has never sm

## 2021-05-11 ENCOUNTER — Telehealth (HOSPITAL_COMMUNITY): Payer: Self-pay

## 2021-05-11 ENCOUNTER — Ambulatory Visit (INDEPENDENT_AMBULATORY_CARE_PROVIDER_SITE_OTHER): Payer: Medicare HMO

## 2021-05-11 ENCOUNTER — Telehealth: Payer: Self-pay | Admitting: Physician Assistant

## 2021-05-11 ENCOUNTER — Encounter (HOSPITAL_COMMUNITY): Payer: Self-pay

## 2021-05-11 ENCOUNTER — Ambulatory Visit (HOSPITAL_COMMUNITY)
Admission: RE | Admit: 2021-05-11 | Discharge: 2021-05-11 | Disposition: A | Discharge status: Home / Self Care | Payer: Medicare HMO | Source: Ambulatory Visit | Attending: Adult Health | Admitting: Adult Health

## 2021-05-11 VITALS — BP 100/52 | HR 61 | Wt 114.2 lb

## 2021-05-11 DIAGNOSIS — I255 Ischemic cardiomyopathy: Secondary | ICD-10-CM | POA: Diagnosis not present

## 2021-05-11 DIAGNOSIS — F419 Anxiety disorder, unspecified: Secondary | ICD-10-CM | POA: Diagnosis not present

## 2021-05-11 DIAGNOSIS — R339 Retention of urine, unspecified: Secondary | ICD-10-CM | POA: Diagnosis not present

## 2021-05-11 DIAGNOSIS — I2510 Atherosclerotic heart disease of native coronary artery without angina pectoris: Secondary | ICD-10-CM | POA: Diagnosis not present

## 2021-05-11 DIAGNOSIS — I447 Left bundle-branch block, unspecified: Secondary | ICD-10-CM

## 2021-05-11 DIAGNOSIS — Z960 Presence of urogenital implants: Secondary | ICD-10-CM | POA: Diagnosis not present

## 2021-05-11 DIAGNOSIS — I051 Rheumatic mitral insufficiency: Secondary | ICD-10-CM | POA: Diagnosis not present

## 2021-05-11 DIAGNOSIS — I480 Paroxysmal atrial fibrillation: Secondary | ICD-10-CM | POA: Diagnosis not present

## 2021-05-11 DIAGNOSIS — I252 Old myocardial infarction: Secondary | ICD-10-CM | POA: Diagnosis not present

## 2021-05-11 DIAGNOSIS — Z5181 Encounter for therapeutic drug level monitoring: Secondary | ICD-10-CM

## 2021-05-11 DIAGNOSIS — Z79899 Other long term (current) drug therapy: Secondary | ICD-10-CM | POA: Insufficient documentation

## 2021-05-11 DIAGNOSIS — I5022 Chronic systolic (congestive) heart failure: Secondary | ICD-10-CM | POA: Insufficient documentation

## 2021-05-11 DIAGNOSIS — I509 Heart failure, unspecified: Secondary | ICD-10-CM

## 2021-05-11 DIAGNOSIS — Z7901 Long term (current) use of anticoagulants: Secondary | ICD-10-CM | POA: Diagnosis not present

## 2021-05-11 DIAGNOSIS — I4892 Unspecified atrial flutter: Secondary | ICD-10-CM | POA: Insufficient documentation

## 2021-05-11 LAB — POCT INR: INR: 2.1 (ref 2.0–3.0)

## 2021-05-11 LAB — BASIC METABOLIC PANEL
Anion gap: 12 (ref 5–15)
BUN: 29 mg/dL — ABNORMAL HIGH (ref 8–23)
CO2: 35 mmol/L — ABNORMAL HIGH (ref 22–32)
Calcium: 9.5 mg/dL (ref 8.9–10.3)
Chloride: 87 mmol/L — ABNORMAL LOW (ref 98–111)
Creatinine, Ser: 1.19 mg/dL (ref 0.61–1.24)
GFR, Estimated: >60 mL/min (ref >60)
Glucose, Bld: 156 mg/dL — ABNORMAL HIGH (ref 70–99)
Potassium: 2.4 mmol/L — CL (ref 3.5–5.1)
Sodium: 134 mmol/L — ABNORMAL LOW (ref 135–145)

## 2021-05-11 MED ORDER — TORSEMIDE 20 MG PO TABS
20.0000 mg | ORAL_TABLET | Freq: Every day | ORAL | 2 refills | Status: AC
Start: 2021-05-11 — End: ?

## 2021-05-11 MED ORDER — POTASSIUM CHLORIDE ER 10 MEQ PO TBCR
40.0000 meq | EXTENDED_RELEASE_TABLET | Freq: Every day | ORAL | 3 refills | Status: DC
Start: 2021-05-11 — End: 2021-05-18

## 2021-05-11 NOTE — Patient Instructions (Addendum)
Good to see you today! ? ?Labs done today, your results will be available in MyChart, we will contact you for abnormal readings. ? ?Take metolazone as needed for wt  of 120 lbs ? ?HOLD  torsemide Friday and Saturday  ? ?On Sunday take torsemide 20 mg daily ? ?If you have any questions or concerns before your next appointment please send [name] a message through [place] or call our office at [phone].   ? ?TO LEAVE A MESSAGE FOR THE NURSE SELECT OPTION 2, PLEASE LEAVE A MESSAGE INCLUDING: ?YOUR NAME ?DATE OF BIRTH ?CALL BACK NUMBER ?REASON FOR CALL**this is important as we prioritize the call backs ? ?YOU WILL RECEIVE A CALL BACK THE SAME DAY AS LONG AS YOU CALL BEFORE 4:00 PM ? ?At the [facility], you and your health needs are our priority. As part of our continuing mission to provide you with exceptional heart care, we have created designated Provider Care Teams. These Care Teams include your primary Cardiologist (physician) and Advanced Practice Providers (APPs- Physician Assistants and Nurse Practitioners) who all work together to provide you with the care you need, when you need it.  ? ?You may see any of the following providers on your designated Care Team at your next follow up: ?Dr Glori Bickers ?Dr Loralie Champagne ?Darrick Grinder, NP ?Lyda Jester, PA ?[name] Milford,NP ?Marlyce Huge, PA ?Audry Riles, PharmD ? ? ?Please be sure to bring in all your medications bottles to every appointment.  ? ? ?

## 2021-05-11 NOTE — Patient Instructions (Addendum)
Continue 0.5 tablet daily except 1 tablet each Monday, Wednesday and Friday.  Repeat INR in 6 weeks. [phone] ?

## 2021-05-11 NOTE — Telephone Encounter (Signed)
Spoke with lab and got a critical lab of potassium 2.5. Provider informed  ?

## 2021-05-11 NOTE — Telephone Encounter (Signed)
Low potassium @ 2.5. A. Clegg NP informed. ?Per Amy's  instructions potassium 40 meq now with repeat potassium  40 meq in 4 hours later.Increase potassium  to 40 meq daily. ?Lab appointment for BMET in a week scheduled ?Pt aware, agreeable, and verbalized understanding ? ?

## 2021-05-11 NOTE — Telephone Encounter (Signed)
Patient was seen in the HF clinic today, interrogation fo his device there gave an Impedance alert/warning on the SCV coil for 100Ohms. ?MDT rep checked the device, re measured and got consistent measurements. ?Did not recommend and change. ?I did follow up with [employer] rep given is an old Guidant lead. ?This particular lead impedance range is 20-125 and current measurements remain acceptable for the lead. Did not think any particular intervention or monitoring was needed ?I will send a message to our device clinic given is a change from his last remote of 85Ohms to see the patient. ? ?Tommye Standard, PA-C ?

## 2021-05-12 ENCOUNTER — Encounter: Payer: Self-pay | Admitting: Family Medicine

## 2021-05-12 ENCOUNTER — Encounter (HOSPITAL_COMMUNITY): Payer: Medicare HMO

## 2021-05-12 ENCOUNTER — Telehealth: Payer: Self-pay

## 2021-05-12 ENCOUNTER — Ambulatory Visit (INDEPENDENT_AMBULATORY_CARE_PROVIDER_SITE_OTHER): Payer: Medicare HMO

## 2021-05-12 DIAGNOSIS — R3914 Feeling of incomplete bladder emptying: Secondary | ICD-10-CM | POA: Diagnosis not present

## 2021-05-12 DIAGNOSIS — I5022 Chronic systolic (congestive) heart failure: Secondary | ICD-10-CM | POA: Diagnosis not present

## 2021-05-12 DIAGNOSIS — I255 Ischemic cardiomyopathy: Secondary | ICD-10-CM | POA: Diagnosis not present

## 2021-05-12 NOTE — Telephone Encounter (Signed)
Patient seen in device clinic 05/12/21. ?

## 2021-05-12 NOTE — Progress Notes (Signed)
In-clinic check due to elevated SVC impedance, per SK turn SVC impedance off. Can only for shock vector. Alert tone turned off and home monitor alert turned off regarding SVC. ?

## 2021-05-12 NOTE — Telephone Encounter (Signed)
-----   Message from [place], [place] sent at 05/11/2021  4:27 PM EDT ----- ?I have an encounter note in for this patient ? ?Please have him come in to the device clinic in a few weeks to re check his RV lead shock impedance, SVC coil particularly ? ?Thanks ?renee ? ?

## 2021-05-12 NOTE — Progress Notes (Signed)
Pt and wife requested time to think about program before making a decision on enrollment.  Video visit was cancelled and telephone note created.  Encounter left open in error. ?Damaris Hippo FNP-C ?

## 2021-05-16 DIAGNOSIS — R3914 Feeling of incomplete bladder emptying: Secondary | ICD-10-CM | POA: Diagnosis not present

## 2021-05-18 ENCOUNTER — Telehealth: Payer: Self-pay | Admitting: Family Medicine

## 2021-05-18 ENCOUNTER — Other Ambulatory Visit (HOSPITAL_COMMUNITY): Payer: Self-pay

## 2021-05-18 ENCOUNTER — Ambulatory Visit (HOSPITAL_COMMUNITY)
Admission: RE | Admit: 2021-05-18 | Discharge: 2021-05-18 | Disposition: A | Discharge status: Home / Self Care | Payer: Medicare HMO | Source: Ambulatory Visit | Attending: Internal Medicine | Admitting: Internal Medicine

## 2021-05-18 ENCOUNTER — Encounter: Payer: Self-pay | Admitting: Family Medicine

## 2021-05-18 ENCOUNTER — Encounter (HOSPITAL_COMMUNITY): Payer: Self-pay

## 2021-05-18 VITALS — BP 90/62 | HR 118 | Wt 123.0 lb

## 2021-05-18 DIAGNOSIS — R339 Retention of urine, unspecified: Secondary | ICD-10-CM

## 2021-05-18 DIAGNOSIS — Z951 Presence of aortocoronary bypass graft: Secondary | ICD-10-CM | POA: Diagnosis not present

## 2021-05-18 DIAGNOSIS — I4892 Unspecified atrial flutter: Secondary | ICD-10-CM

## 2021-05-18 DIAGNOSIS — I447 Left bundle-branch block, unspecified: Secondary | ICD-10-CM | POA: Insufficient documentation

## 2021-05-18 DIAGNOSIS — I454 Nonspecific intraventricular block: Secondary | ICD-10-CM | POA: Diagnosis not present

## 2021-05-18 DIAGNOSIS — Z7901 Long term (current) use of anticoagulants: Secondary | ICD-10-CM | POA: Diagnosis not present

## 2021-05-18 DIAGNOSIS — I509 Heart failure, unspecified: Secondary | ICD-10-CM | POA: Diagnosis not present

## 2021-05-18 DIAGNOSIS — I480 Paroxysmal atrial fibrillation: Secondary | ICD-10-CM | POA: Insufficient documentation

## 2021-05-18 DIAGNOSIS — I441 Atrioventricular block, second degree: Secondary | ICD-10-CM | POA: Insufficient documentation

## 2021-05-18 DIAGNOSIS — Z8616 Personal history of COVID-19: Secondary | ICD-10-CM | POA: Insufficient documentation

## 2021-05-18 DIAGNOSIS — Z978 Presence of other specified devices: Secondary | ICD-10-CM

## 2021-05-18 DIAGNOSIS — I5022 Chronic systolic (congestive) heart failure: Secondary | ICD-10-CM | POA: Diagnosis not present

## 2021-05-18 DIAGNOSIS — I2510 Atherosclerotic heart disease of native coronary artery without angina pectoris: Secondary | ICD-10-CM

## 2021-05-18 DIAGNOSIS — Z8249 Family history of ischemic heart disease and other diseases of the circulatory system: Secondary | ICD-10-CM | POA: Insufficient documentation

## 2021-05-18 DIAGNOSIS — I340 Nonrheumatic mitral (valve) insufficiency: Secondary | ICD-10-CM | POA: Diagnosis not present

## 2021-05-18 DIAGNOSIS — I483 Typical atrial flutter: Secondary | ICD-10-CM

## 2021-05-18 DIAGNOSIS — Z95810 Presence of automatic (implantable) cardiac defibrillator: Secondary | ICD-10-CM | POA: Insufficient documentation

## 2021-05-18 DIAGNOSIS — Z7984 Long term (current) use of oral hypoglycemic drugs: Secondary | ICD-10-CM | POA: Insufficient documentation

## 2021-05-18 DIAGNOSIS — K4090 Unilateral inguinal hernia, without obstruction or gangrene, not specified as recurrent: Secondary | ICD-10-CM | POA: Insufficient documentation

## 2021-05-18 DIAGNOSIS — N323 Diverticulum of bladder: Secondary | ICD-10-CM | POA: Diagnosis not present

## 2021-05-18 DIAGNOSIS — I252 Old myocardial infarction: Secondary | ICD-10-CM | POA: Insufficient documentation

## 2021-05-18 DIAGNOSIS — J90 Pleural effusion, not elsewhere classified: Secondary | ICD-10-CM | POA: Diagnosis not present

## 2021-05-18 DIAGNOSIS — I255 Ischemic cardiomyopathy: Secondary | ICD-10-CM | POA: Diagnosis not present

## 2021-05-18 DIAGNOSIS — F419 Anxiety disorder, unspecified: Secondary | ICD-10-CM | POA: Insufficient documentation

## 2021-05-18 DIAGNOSIS — Z79899 Other long term (current) drug therapy: Secondary | ICD-10-CM | POA: Insufficient documentation

## 2021-05-18 LAB — BASIC METABOLIC PANEL
Anion gap: 5 (ref 5–15)
BUN: 18 mg/dL (ref 8–23)
CO2: 28 mmol/L (ref 22–32)
Calcium: 9.6 mg/dL (ref 8.9–10.3)
Chloride: 105 mmol/L (ref 98–111)
Creatinine, Ser: 0.99 mg/dL (ref 0.61–1.24)
GFR, Estimated: >60 mL/min (ref >60)
Glucose, Bld: 93 mg/dL (ref 70–99)
Potassium: 4.6 mmol/L (ref 3.5–5.1)
Sodium: 138 mmol/L (ref 135–145)

## 2021-05-18 MED ORDER — AMIODARONE HCL 200 MG PO TABS: 200.0000 mg | ORAL_TABLET | Freq: Two times a day (BID) | ORAL | 3 refills | Status: AC

## 2021-05-18 MED ORDER — POTASSIUM CHLORIDE ER 10 MEQ PO TBCR: 40.0000 meq | EXTENDED_RELEASE_TABLET | Freq: Every day | ORAL | 3 refills | Status: AC

## 2021-05-18 NOTE — Patient Instructions (Signed)
STOP Losartan ?START Amiodarone 200 mg, one tab twice a day  ? ?Labs today ?We will only contact you if something comes back abnormal or we need to make some changes. ?Otherwise no news is good news! ? ? ?Keep followup as scheduled  ? ? ?You are scheduled for a Cardioversion on 05/19/2021 with Dr. Aundra Dubin.  Please arrive at the [facility] (Main Entrance A) at [facility]: [address] [place], [place] [address] at 630 am ? ?DIET: Nothing to eat or drink after midnight except a sip of water with medications (see medication instructions below) ? ?Medication Instructions: ?Hold torsemide and spiro ? ?Continue your anticoagulant: coumadin ?You will need to continue your anticoagulant after your procedure until you  are told by your  ?Provider that it is safe to stop ? ? ?Labs: pre procedure labs done 05/18/21 ? ?You must have a responsible person to drive you home and stay in the waiting area during your procedure. Failure to do so could result in cancellation. ? ?Interior and spatial designer cards. ? ?*Special Note: Every effort is made to have your procedure done on time. Occasionally there are emergencies that occur at the hospital that may cause delays. Please be patient if a delay does occur.  ? ?

## 2021-05-18 NOTE — Anesthesia Preprocedure Evaluation (Signed)
Anesthesia Evaluation  ? ? ?Airway ? ? ? ? ? ? ? Dental ?  ?Pulmonary ?asthma , COPD,  ?  ? ? ? ? ? ? ? Cardiovascular ?hypertension, Pt. on medications and Pt. on home beta blockers ?+ CAD, + Past MI and +CHF  ?+ dysrhythmias (on Coumadin) Atrial Fibrillation + Cardiac Defibrillator ? ? ? ?  ?Neuro/Psych ?negative neurological ROS ? negative psych ROS  ? GI/Hepatic ?Neg liver ROS, GERD  Medicated,  ?Endo/Other  ?negative endocrine ROS ? Renal/GU ?negative Renal ROS  ?negative genitourinary ?  ?Musculoskeletal ? ?(+) Arthritis , Osteoarthritis,   ? Abdominal ?  ?Peds ? Hematology ?negative hematology ROS ?(+)   ?Anesthesia Other Findings ? ? Reproductive/Obstetrics ? ?  ? ? ? ? ? ? ? ? ? ? ? ? ? ?  ?  ? ? ? ? ? ? ? ? ?Anesthesia Physical ?Anesthesia Plan ? ?ASA: 4 ? ?Anesthesia Plan: General  ? ?Post-op Pain Management:   ? ?Induction: Intravenous ? ?PONV Risk Score and Plan: 1 and Propofol infusion ? ?Airway Management Planned: Mask ? ?Additional Equipment: None ? ?Intra-op Plan:  ? ?Post-operative Plan:  ? ?Informed Consent:  ? ?Plan Discussed with:  ? ?Anesthesia Plan Comments: (Lab Results ?     Component                Value               Date                 ?     WBC                      7.7                 02/21/2021           ?     HGB                      17.0                04/17/2021           ?     HCT                      50.0                04/17/2021           ?     MCV                      90.8                02/21/2021           ?     PLT                      237                 02/21/2021           ?Lab Results ?     Component                Value               Date                 ?     NA  138                 05/18/2021           ?     K                        4.6                 05/18/2021           ?     CO2                      28                  05/18/2021           ?     GLUCOSE                  93                  05/18/2021            ?     BUN                      18                  05/18/2021           ?     CREATININE               0.99                05/18/2021           ?     CALCIUM                  9.6                 05/18/2021           ?     EGFR                     88                  12/30/2020           ?     GFRNONAA                 >60                 05/18/2021           ?ECHO 03/23: ??1. Left ventricular ejection fraction, by estimation, is <20%. The left  ?ventricle has severely decreased function. The left ventricular internal  ?cavity size was severely dilated.  ??2. Right ventricular systolic function is severely reduced. The right  ?ventricular size is mildly enlarged.  ??3. Left atrial size was severely dilated. No left atrial/left atrial  ?appendage thrombus was detected.  ??4. Right atrial size was moderately dilated.  ??5. The mitral valve is normal in structure. Moderate to severe mitral  ?valve regurgitation.  ??6. Tricuspid valve regurgitation is moderate.  ??7. The aortic valve is tricuspid. Aortic valve regurgitation is moderate  ?to severe.  ??8. There is Moderate (Grade III) plaque involving the descending aorta. )  ? ? ? ? ? ? ?Anesthesia Quick Evaluation ? ?

## 2021-05-18 NOTE — Progress Notes (Addendum)
Patient ID: [name], male   DOB: November 27, 1940, 81 y.o.   MRN: [id number] ? ?  ?Advanced Heart Failure Clinic Note  ? ?Date:  05/18/2021  ? ?ID:  [name], DOB October 02, 1940, MRN [id number] ? ?PCP:  [name], MD  ?Primary Cardiologist:  [name] ?Referring: Dr. [name] ?HF Cardiologist: Dr. [name] ?  ?History of Present Illness: ?[name] is a 81 y.o. 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 In September 1998 he underwent CABG after an unsuccessful attempt at stenting of his proximal LAD by [name]. EF was 25-30%. He is s/p ICD with upgrade to CRT-D.  ? ?Myoview 11/13 showed a large area of scar in the LAD territory (extent 44%) involving the mid to apical anterior, apical and infero-apical to mid infero-septal and apical lateral wall without associated ischemia. ? ?Admitted 5/16 atrial fibrillation and started on Tikosyn.  He was enrolled in the genetic AF trial (bucindolol vs Toprol). He did poorly in the trial in the setting of titration of beta-blocker. He was referred to HF Clinic. ? ?Echo 4/21 EF 20% LV markedly dilated. Severe MR  RV moderately reduced.  ? ?Echo reviewed with Dr. Copper regarding MitraClip and LV failure felt to be too advanced. ? ?Had mild COVID in April 2022.  ? ?f/u 09/22. Volume looked okay. No changes made. ? ?Saw GI 10/20/20. Completing further workup with [name] for ongoing abdominal pain. CT A/P obtained - incidentally noted moderate to large right pleural effusion. Subsequent CT chest with small pleural effusion. ? ?Seen in ED 11/27/20 for AFL, with rate in 110s. Of note, on course of antibiotics for UTI. He was stable and discharged home and referred for close f/u.  ? ?Follow up 11/28/20, remained in AFL, felt bad. DCCV arranged 11/29/20, AFL--> NSR. ? 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 ? ?Follow up 3/23, volume up.  Metolazone 2.5 added q/ Wednesday and discussed addition of Palliative services. Several days later, device interrogation showed AF. Arranged for DCCV, but cancelled due to INR of 1.8. Unsuccessful attempt to pace patient out of rhythm.  ? ?On 3/31 had TEE/cardioversion with conversion to SR.  ? ?Had video visit with [facility] 04/2021 for Palliative Care.  ? ?He was seen in the HF clinic last week. Volume status was low. Diuretics held x 2 days the started torsemide 20 mg daily. In SR at that time.  ? ?He presents for an acute visit due to A flutter RVR. Says he feels like he went out of rhythm on Monday. Overall feeling fine. SOB with exertion. Denies PND/Orthopnea. Appetite ok. No fever or chills. Weight at home 120 pounds. Taking all medications.  ? ?Cardiac Testing ?- CPX 02/17/16 ?Resting HR: 73 Peak HR: 108   (75% age predicted max HR) ?BP rest: 110/58 BP peak: 150/56 ?Peak VO2: 14.4 (58% predicted peak VO2) ?VE/VCO2 slope:  33 ?OUES: 1.32 ?Peak RER: 1.10 ?PETCO2 at peak:  33 ?O2pulse:  9   (82% predicted O2pulse) ? ?- Echo 12/17 EF 15-20%, Moderate MR, RV with reduced function. ? ?- Echo 5/17 EF 20% RV mildly down.  ? ? ?Past Medical History:  ?Diagnosis Date  ? Acute on chronic systolic CHF (congestive heart failure) ([place]) 06/09/2015  ? Automatic implantable cardiac defibrillator in situ 2002; 2010  ? medtronic virtuso  ? Cardiomyopathy, ischemic 2011  ? with EF 25-35%  by echo  ? Coronary artery disease   ? Hx MI 1992, CABG 1998 , Nuc study 11.2013 large scar but no ischemia  ? GERD (gastroesophageal reflux disease)   ? H/O myocardial infarction, greater than 8 weeks 1992  ? large ant wall injury  ? NSVT (nonsustained ventricular tachycardia) ([place])   ? Paroxysmal atrial fibrillation ([facility])   ? ?Current Outpatient Medications  ?Medication Sig Dispense Refill  ? acetaminophen (TYLENOL) 650 MG CR tablet Take 650 mg by mouth 2 (two) times daily.     ? allopurinol (ZYLOPRIM) 300 MG tablet Take 300 mg by mouth in  the morning.    ? benzonatate (TESSALON) 100 MG capsule Take 100 mg by mouth 3 (three) times daily as needed for cough.    ? bisoprolol (ZEBETA) 5 MG tablet TAKE 1/2 TABLET EVERY DAY 45 tablet 3  ? cetirizine (ZYRTEC) 10 MG tablet Take 10 mg by mouth daily.    ? cholecalciferol (VITAMIN D3) 25 MCG (1000 UNIT) tablet Take 1,000 Units by mouth daily at 12 noon.    ? dapagliflozin propanediol (FARXIGA) 10 MG TABS tablet Take 1 tablet (10 mg total) by mouth daily before breakfast. 90 tablet 3  ? dicyclomine (BENTYL) 10 MG capsule TAKE 1 CAPSULE EVERY 8 HOURS AS NEEDED FOR SPASM(S) (Patient taking differently: Take 10 mg by mouth in the morning, at noon, and at bedtime.) 180 capsule 1  ? isosorbide mononitrate (IMDUR) 30 MG 24 hr tablet TAKE 1/2 TABLET (15 MG) BY MOUTH DAILY 45 tablet 0  ? LORazepam (ATIVAN) 1 MG tablet Take 0.5-1 mg by mouth at bedtime.    ? losartan (COZAAR) 25 MG tablet Take 0.5 tablets (12.5 mg total) by mouth at bedtime. 180 tablet 3  ? magnesium oxide (MAG-OX) 400 MG tablet Take 400 mg by mouth daily at 12 noon.    ? Polyethyl Glycol-Propyl Glycol (SYSTANE OP) Place 2-3 drops into both eyes 3 (three) times daily as needed (dry eyes).    ? potassium chloride (KLOR-CON) 10 MEQ tablet Take 4 tablets (40 mEq total) by mouth daily. Patient takes 1/2 tablet by mouth on Monday Wednesday and Friday and Saturday and also takes 4 tablets on Wednesdays. 200 tablet 3  ? Psyllium (METAMUCIL) 48.57 % POWD Take 1 packet by mouth daily as needed (regularity).    ? simvastatin (ZOCOR) 40 MG tablet TAKE 1 TABLET EVERY DAY (NEED MD APPOINTMENT FOR REFILLS) 90 tablet 3  ? spironolactone (ALDACTONE) 25 MG tablet TAKE 1 TABLET EVERY DAY 90 tablet 3  ? tamsulosin (FLOMAX) 0.4 MG CAPS capsule Take 0.4 mg by mouth daily.    ? torsemide (DEMADEX) 20 MG tablet Take 1 tablet (20 mg total) by mouth daily. 52 tablet 2  ? Vitamins-Lipotropics (LIPO FLAVONOID PLUS) TABS Take 1 tablet by mouth in the morning, at noon, and at  bedtime.    ? warfarin (COUMADIN) 5 MG tablet TAKE 1/2 TABLET TO 1 TABLET DAILY AS DIRECTED BY THE COUMADIN CLINIC 90 tablet 1  ? metolazone (ZAROXOLYN) 2.5 MG tablet Take 1 tablet (2.5 mg total) by mouth once a week. Every Wednesday with 40Meq of potassium (4 Tabs) 20 tablet 2  ? mometasone (ASMANEX) 220 MCG/INH inhaler Inhale 2 puffs into the lungs daily as needed (asthma).    ? montelukast (SINGULAIR) 10 MG tablet Take 1 tablet (10 mg total) by mouth at bedtime. PLEASE SCHEDULE APPOINTMENT. 30 tablet 0  ? pantoprazole (PROTONIX) 20 MG tablet TAKE 1 TABLET TWICE DAILY 180 tablet  1  ? ?No current facility-administered medications for this encounter.  ? ?Allergies:    ?Allergies  ?Allergen Reactions  ? Amoxicillin Rash and Other (See Comments)  ?  Has patient had a PCN reaction causing immediate rash, facial/tongue/throat swelling, SOB or lightheadedness with hypotension: Yes ?Has patient had a PCN reaction causing severe rash involving mucus membranes or skin necrosis: No ?Has patient had a PCN reaction that required hospitalization: No ?Has patient had a PCN reaction occurring within the last 10 years: No ?If all of the above answers are "NO", then may proceed with Cephalosporin use. ?  ? Penicillins Rash  ?  Has patient had a PCN reaction causing immediate rash, facial/tongue/throat swelling, SOB or lightheadedness with hypotension: Yes ?Has patient had a PCN reaction causing severe rash involving mucus membranes or skin necrosis: No ?Has patient had a PCN reaction that required hospitalization: No ?Has patient had a PCN reaction occurring within the last 10 years: No ?If all of the above answers are "NO", then may proceed with Cephalosporin use. ?  ? ?Social History:  The patient  reports that he has never smoked. He has never used smokeless tobacco. He reports current alcohol use of about 2.0 standard drinks per week. He reports that he does not use drugs.  ? ?Family history:   ?Family History  ?Problem  Relation Age of Onset  ? Heart disease Father   ?     questionable  ? Stroke Mother   ? Heart failure Sister   ? Healthy Brother   ? Healthy Sister   ? Parkinsonism Brother   ? Colon cancer Neg Hx   ? ?Review of syste

## 2021-05-18 NOTE — Addendum Note (Signed)
Encounter addended by: Conrad Blodgett, NP on: 05/18/2021 11:31 AM ? Actions taken: Medication taking status modified, Clinical Note Signed

## 2021-05-18 NOTE — Addendum Note (Signed)
Encounter addended by: Kerry Dory, CMA on: 05/18/2021 10:55 AM ? Actions taken: Order list changed

## 2021-05-18 NOTE — Addendum Note (Signed)
Encounter addended by: Conrad Weinert, NP on: 05/18/2021 11:41 AM ? Actions taken: Clinical Note Signed

## 2021-05-18 NOTE — Addendum Note (Signed)
Encounter addended by: Conrad [place], NP on: 05/18/2021 12:02 PM ? Actions taken: Level of Service modified, Visit diagnoses modified, Pharmacy for encounter modified, Clinical Note Signed

## 2021-05-18 NOTE — Addendum Note (Signed)
Encounter addended by: Kerry Dory, CMA on: 05/18/2021 11:26 AM ? Actions taken: Vitals modified

## 2021-05-18 NOTE — Addendum Note (Signed)
Encounter addended by: Kerry Dory, CMA on: 05/18/2021 12:03 PM ? Actions taken: Medication long-term status modified, Pharmacy for encounter modified, Order list changed, Pend clinical note, Clinical Note Signed

## 2021-05-18 NOTE — Addendum Note (Signed)
Encounter addended by: Conrad [place], NP on: 05/18/2021 11:32 AM ? Actions taken: Order Reconciliation Section accessed, Home Medications modified, Medication taking status modified, Medication List reviewed, Vitals modified, Clinical Note Signed

## 2021-05-18 NOTE — Progress Notes (Signed)
? ? ?Manufacturing engineer ?[facility] Consult Note ?Telephone: [phone]  ?Fax: [phone]  ? ?Date of encounter: 05/18/21 ?12:56 PM ?PATIENT NAME: [name] ?[place] [address]   ?[phone] (home)  ?DOB: Nov 02, 1940 ?MRN: [id number] ?PRIMARY CARE PROVIDER:    ?[name], MD,  ?[address] ?[place] [address] ?[phone] ? ?REFERRING PROVIDER:   ?[name], MD ?[address] ?[address] ?[place],  [place] [address] ?[phone] ? ?RESPONSIBLE PARTY:    ?Contact Information   ? ? Name Relation Home Work Mobile  ? Dant,Brenda Spouse [phone]  [phone]  ? Samyak, Sackmann Daughter   [phone]  ? Treg, Diemer Son  [phone]   ? Fount, Bahe   [phone]  ? ?  ? ?I connected with  AMISH MINTZER on 05/18/21 by a video enabled telemedicine application and verified that I am speaking with the correct person using two identifiers. ?  ?I discussed the limitations of evaluation and management by telemedicine. The patient expressed understanding and agreed to proceed.  ? ?Palliative Care was asked to follow this patient by consultation request of  [name], MD to address advance care planning and complex medical decision making. This is the initial visit.  ? ? ?      ASSESSMENT, SYMPTOM MANAGEMENT AND PLAN / RECOMMENDATIONS:  ? Atrial fibrillation, paroxysmal on long term chronic anticoagulation ?Scheduled 05/19/21 for repeat cardioversion, starting Amiodarone 200 mg BID. ?INR therapeutic last week at 2.1, continued management per Coumadin clinic with no abnormal bleeding. ?Follow up visit in 3-4 weeks. ? ? Ischemic cardiomyopathy ?Stable with no evidence of volume overload. ?Continue Zebeta 1/2 of 5 mg tab daily, Farxciga 10 mg daily and Imdur 1/2 of 30 mg tablet daily, and Torsemide 20 mg daily with 40 mEq KCL. ? ? Inguinal hernia without gangrene or obstruction ?Discussed warning signs of fever, nausea,  vomiting, severe abd pain or difficulty with having a BM for possible obstruction, ?Encouraged no heavy lifting.   ?Agree with conservative management due to heart disease. ? ? 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 ?Continue Flomax 0.4 mg po daily. ? ? ?Follow up Palliative Care Visit: Palliative care will continue to follow for complex medical decision making, advance care planning, and clarification of goals. Return 4 weeks or prn. ? ? ? ?This visit was coded based on medical decision making (MDM). ? ?PPS: 60% ? ?HOSPICE ELIGIBILITY/DIAGNOSIS: TBD ? ?Chief Complaint:  ?[facility] received a referral to follow up with patient for chronic disease management in setting of ischemic cardiomyopathy and atrial fibrillation with chronic anticoagulation.  Follow up is also for advance directive counseling and defining/refining goals of care. ? ? ?HISTORY OF PRESENT ILLNESS:  [name] is a 81 y.o. year old male systolic CHF NYHA Class 3 with paroxysmal atrial fibrillation on Coumadin anticoagulation, CAD, hemorrhoids, HTN, asthma/COPD, IBS, functional dyspepsia, pseudogout of knee, early satiety, vitamin D deficiency, inguinal hernia and foley cath in place.  INR 2.1 last week, does monthly with no abnormal bleeding. No CP, SOB, DOE or in afib.  Just had cardioversion 04/28/21 and went back into afib.  Per Cardiologist he will start Amiodarone 200 mg BID and have another cardioversion in am.  No blood in stools.   No falls recently.  Changed from Zaroxylyn to Torsemide.  Has [facility] and has permanent foley.  Has hernia and doesn't want him to have surgery.  ? ?  History obtained from review of EMR, discussion with primary team, and interview with family, facility staff/caregiver and/or Mr. Luellen.  ?I reviewed available labs, medications, imaging, studies and related documents from the EMR.  Records reviewed and summarized above.   ? ?ROS ?General: NAD ?Cardiovascular: denies chest pain, endorses DOE ?Pulmonary: denies cough, denies increased SOB ?Abdomen: endorses good appetite, denies constipation, endorses continence of bowel ?GU: denies dysuria, indwelling foley cath present ?MSK:  denies increased weakness, no falls reported ?Neurological: denies pain, denies insomnia with use of 1/4 tab Ativan nightly ?Psych: Endorses positive mood ?Heme/lymph/immuno: denies bruises, abnormal bleeding ? ?Physical Exam: ?Current and past weights: 123 lbs as of 05/18/21, 125.6 lbs as of 02/02/21 ?Constitutional: NAD ?General: thin, WD ?EYES: anicteric sclera, lids intact, no discharge  ?ENMT: intact hearing ?CV: Can speak in complete sentences without having to stop to breathe, no visible cyanosis ?Pulmonary: No audible cough/wheezing or dyspnea on room air ?MSK: moves upper extremtities ?Neuro:  no cognitive impairment ?Psych: non-anxious affect, A and O x 3 ?Hem/lymph/immuno: no widespread bruising ? ?CURRENT PROBLEM LIST:  ?Patient Active Problem List  ? Diagnosis Date Noted  ? Acute on chronic systolic heart failure ([place]) 07/06/2020  ? Vitamin D deficiency 07/06/2020  ? Essential hypertension 07/06/2020  ? Chronic obstructive pulmonary disease, unspecified ([place]) 07/06/2020  ? Abdominal pain   ? Altered bowel habits   ? Benign neoplasm of colon   ? Early satiety 09/11/2017  ? Encounter for therapeutic drug monitoring 10/22/2016  ? Congestive heart failure, NYHA class 3 ([place]) 09/10/2016  ? LBBB (left bundle branch block) 11/24/2015  ? Generalized abdominal fullness 06/17/2015  ? Pseudogout of knee   ? NSVT (nonsustained ventricular tachycardia) ([place])   ? Cholecystitis   ? Hyperlipidemia LDL goal <70 07/08/2014  ? Hemorrhoids, internal 05/24/2014  ? Abdominal pain, chronic, epigastric 04/08/2014  ? Functional dyspepsia 04/08/2014  ? Visual field defect 12/30/2012  ? Bradycardia 08/11/2012  ? Atherosclerotic heart disease of native coronary artery without  angina pectoris   ? 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situ   ? SYSTOLIC HEART FAILURE, 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  ? History of ventricular tachycardia 05/04/2008  ? GERD 03/02/2008  ? Asthma 10/09/2007  ? Irritable bowel syndrome 10/09/2007  ? Ischemic cardiomyopathy 05/13/2007  ? ?PAST MEDICAL HISTORY:  ?Active Ambulatory Problems  ?  Diagnosis Date Noted  ? Ischemic cardiomyopathy 05/13/2007  ? History of ventricular tachycardia 05/04/2008  ? SYSTOLIC HEART FAILURE, 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  ? Asthma 10/09/2007  ? GERD 03/02/2008  ? Irritable bowel syndrome 10/09/2007  ? 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situ   ? Bradycardia 08/11/2012  ? Atherosclerotic heart disease of native coronary artery without angina pectoris   ? Abdominal pain, chronic, epigastric 04/08/2014  ? Functional dyspepsia 04/08/2014  ? Hemorrhoids, internal 05/24/2014  ? Hyperlipidemia LDL goal <70 07/08/2014  ? Cholecystitis   ? Pseudogout of knee   ? NSVT (nonsustained ventricular tachycardia) ([place])   ? Generalized abdominal fullness 06/17/2015  ? LBBB (left bundle branch block) 11/24/2015  ? Congestive heart failure, NYHA class 3 ([place]) 09/10/2016  ? Encounter for therapeutic drug monitoring 10/22/2016  ? Early satiety 09/11/2017  ? Abdominal pain   ? Altered bowel habits   ? Benign neoplasm of colon   ? Acute on chronic systolic heart failure ([place]) 07/06/2020  ? Vitamin D deficiency 07/06/2020  ? Visual field defect 12/30/2012  ? Essential hypertension 07/06/2020  ? Chronic obstructive pulmonary disease, unspecified ([place]) 07/06/2020  ? ?Resolved Ambulatory Problems  ?  Diagnosis Date Noted  ? HLD (hyperlipidemia) 10/09/2007  ? PAF (paroxysmal atrial fibrillation) ([place]) 05/13/2007  ? Dyspepsia 05/13/2007  ? DIVERTICULITIS, ACUTE 10/10/2007  ? NEPHROLITHIASIS 10/09/2007  ? Abdominal pain, left lower quadrant 10/10/2007  ? Atrial fib/flutter, transient   ? PVC (premature ventricular contraction) 10/02/2011  ? Long term current use  of anticoagulant therapy 04/15/2012  ? Near syncope 08/11/2012  ? Palpitations 12/29/2012  ? Paroxysmal atrial fibrillation ([place]) 02/11/2014  ? Cardiomyopathy, ischemic 04/08/2014  ? Atrial fibrillation with r

## 2021-05-19 ENCOUNTER — Ambulatory Visit (HOSPITAL_COMMUNITY): Payer: Medicare HMO | Admitting: Anesthesiology

## 2021-05-19 ENCOUNTER — Ambulatory Visit (HOSPITAL_COMMUNITY)
Admission: RE | Admit: 2021-05-19 | Discharge: 2021-05-19 | Disposition: A | Discharge status: Home / Self Care | Payer: Medicare HMO | Source: Ambulatory Visit | Attending: Cardiology | Admitting: Cardiology

## 2021-05-19 ENCOUNTER — Encounter (HOSPITAL_COMMUNITY)
Admission: RE | Disposition: A | Discharge status: Home / Self Care | Payer: Self-pay | Source: Ambulatory Visit | Attending: Cardiology

## 2021-05-19 ENCOUNTER — Ambulatory Visit (INDEPENDENT_AMBULATORY_CARE_PROVIDER_SITE_OTHER): Payer: Medicare HMO | Admitting: Internal Medicine

## 2021-05-19 ENCOUNTER — Telehealth: Payer: Self-pay | Admitting: Family Medicine

## 2021-05-19 DIAGNOSIS — I480 Paroxysmal atrial fibrillation: Secondary | ICD-10-CM

## 2021-05-19 DIAGNOSIS — Z539 Procedure and treatment not carried out, unspecified reason: Secondary | ICD-10-CM | POA: Insufficient documentation

## 2021-05-19 DIAGNOSIS — Z5181 Encounter for therapeutic drug level monitoring: Secondary | ICD-10-CM

## 2021-05-19 LAB — PROTIME-INR
INR: 1.7 — ABNORMAL HIGH (ref 0.8–1.2)
Prothrombin Time: 20 seconds — ABNORMAL HIGH (ref 11.4–15.2)

## 2021-05-19 SURGERY — CANCELLED PROCEDURE

## 2021-05-19 MED ORDER — SODIUM CHLORIDE 0.9 % IV SOLN: INTRAVENOUS | Status: DC

## 2021-05-19 NOTE — Telephone Encounter (Signed)
Left vm with [name] that pt states he has to have a referral from PCP to go to [facility] for them to cover Palliative Care Services. Initial referral was made by Dr [name], Cardiologist. Provided fax number for [place] [facility] ?

## 2021-05-19 NOTE — Progress Notes (Signed)
Cardioversion canceled due to INR of 1.7. per Dr Aundra Dubin, to be rescheduled cardioversion with TEE ?

## 2021-05-20 DIAGNOSIS — R404 Transient alteration of awareness: Secondary | ICD-10-CM | POA: Diagnosis not present

## 2021-05-20 DIAGNOSIS — I469 Cardiac arrest, cause unspecified: Secondary | ICD-10-CM | POA: Diagnosis not present

## 2021-05-20 DIAGNOSIS — I499 Cardiac arrhythmia, unspecified: Secondary | ICD-10-CM | POA: Diagnosis not present

## 2021-05-20 DIAGNOSIS — I4901 Ventricular fibrillation: Secondary | ICD-10-CM | POA: Diagnosis not present

## 2021-05-22 ENCOUNTER — Encounter (HOSPITAL_COMMUNITY): Payer: Self-pay

## 2021-05-22 NOTE — Addendum Note (Signed)
Encounter addended by: Jolaine Artist, MD on: 05/22/2021 3:28 PM ? Actions taken: Level of Service modified, Visit diagnoses modified

## 2021-05-26 LAB — CUP PACEART INCLINIC DEVICE CHECK
Battery Remaining Longevity: 24 mo
Battery Voltage: 2.93 V
Brady Statistic AP VP Percent: 96.42 %
Brady Statistic AP VS Percent: 1.8 %
Brady Statistic AS VP Percent: 1.53 %
Brady Statistic AS VS Percent: 0.25 %
Brady Statistic RA Percent Paced: 97.66 %
Brady Statistic RV Percent Paced: 0.03 %
Date Time Interrogation Session: 20230414123414
HighPow Impedance: 56 Ohm
HighPow Impedance: 99 Ohm
Implantable Lead Implant Date: 20010806
Implantable Lead Implant Date: 20180813
Implantable Lead Implant Date: 20180813
Implantable Lead Location: 753858
Implantable Lead Location: 753859
Implantable Lead Location: 753860
Implantable Lead Model: 147
Implantable Lead Model: 5092
Implantable Lead Serial Number: 104581
Implantable Pulse Generator Implant Date: 20180813
Lead Channel Impedance Value: 172.541
Lead Channel Impedance Value: 172.541
Lead Channel Impedance Value: 172.541
Lead Channel Impedance Value: 199.5 Ohm
Lead Channel Impedance Value: 199.5 Ohm
Lead Channel Impedance Value: 304 Ohm
Lead Channel Impedance Value: 399 Ohm
Lead Channel Impedance Value: 399 Ohm
Lead Channel Impedance Value: 399 Ohm
Lead Channel Impedance Value: 513 Ohm
Lead Channel Impedance Value: 608 Ohm
Lead Channel Impedance Value: 608 Ohm
Lead Channel Impedance Value: 608 Ohm
Lead Channel Impedance Value: 646 Ohm
Lead Channel Impedance Value: 703 Ohm
Lead Channel Impedance Value: 703 Ohm
Lead Channel Impedance Value: 722 Ohm
Lead Channel Impedance Value: 779 Ohm
Lead Channel Pacing Threshold Amplitude: 0.5 V
Lead Channel Pacing Threshold Amplitude: 1.25 V
Lead Channel Pacing Threshold Amplitude: 1.625 V
Lead Channel Pacing Threshold Pulse Width: 0.4 ms
Lead Channel Pacing Threshold Pulse Width: 0.4 ms
Lead Channel Pacing Threshold Pulse Width: 0.4 ms
Lead Channel Sensing Intrinsic Amplitude: 11.75 mV
Lead Channel Sensing Intrinsic Amplitude: 19 mV
Lead Channel Sensing Intrinsic Amplitude: 2.75 mV
Lead Channel Sensing Intrinsic Amplitude: 2.75 mV
Lead Channel Setting Pacing Amplitude: 1.75 V
Lead Channel Setting Pacing Amplitude: 2 V
Lead Channel Setting Pacing Amplitude: 3.75 V
Lead Channel Setting Pacing Pulse Width: 0.4 ms
Lead Channel Setting Pacing Pulse Width: 0.4 ms
Lead Channel Setting Sensing Sensitivity: 0.3 mV

## 2021-05-29 ENCOUNTER — Telehealth: Payer: Self-pay

## 2021-05-29 ENCOUNTER — Encounter: Payer: Self-pay | Admitting: Internal Medicine

## 2021-05-29 DIAGNOSIS — 419620001 Death: Secondary | SNOMED CT | POA: Diagnosis not present

## 2021-05-29 NOTE — Telephone Encounter (Signed)
I spoke with the patient wife and she states his ICD is still going off at the funeral home. I told her I will reach out to the [employer] rep to go get it turned off.  ? ?I spoke with Leanna Sato and he is going to call the funeral home and go turn the ICD off.  ?  ?I also ordered the patient a return kit for his monitor. ?

## 2021-05-29 NOTE — Telephone Encounter (Signed)
Called and spoke to his wife ? ?

## 2021-05-29 NOTE — Progress Notes (Unsigned)
Called and spoke with wife following his dying and death  ? ?

## 2021-05-29 DEATH — deceased

## 2021-06-05 ENCOUNTER — Encounter (HOSPITAL_COMMUNITY): Payer: Medicare HMO

## 2021-06-08 ENCOUNTER — Encounter (HOSPITAL_COMMUNITY): Payer: Medicare HMO

## 2021-06-12 ENCOUNTER — Other Ambulatory Visit: Payer: Self-pay | Admitting: Family Medicine

## 2021-06-21 ENCOUNTER — Encounter: Payer: Medicare HMO | Admitting: Internal Medicine
